# Patient Record
Sex: Male | Born: 1935
Health system: Southern US, Community
[De-identification: ages and names within clinical notes are randomized; demographics above are authoritative.]

## PROBLEM LIST (undated history)

## (undated) DIAGNOSIS — K432 Incisional hernia without obstruction or gangrene: Secondary | ICD-10-CM

## (undated) DIAGNOSIS — K219 Gastro-esophageal reflux disease without esophagitis: Secondary | ICD-10-CM

## (undated) DIAGNOSIS — D490 Neoplasm of unspecified behavior of digestive system: Secondary | ICD-10-CM

## (undated) DIAGNOSIS — J42 Unspecified chronic bronchitis: Secondary | ICD-10-CM

## (undated) DIAGNOSIS — J449 Chronic obstructive pulmonary disease, unspecified: Secondary | ICD-10-CM

## (undated) DIAGNOSIS — R0981 Nasal congestion: Secondary | ICD-10-CM

## (undated) DIAGNOSIS — H409 Unspecified glaucoma: Secondary | ICD-10-CM

## (undated) DIAGNOSIS — H9190 Unspecified hearing loss, unspecified ear: Secondary | ICD-10-CM

## (undated) DIAGNOSIS — N1832 Chronic kidney disease, stage 3b: Secondary | ICD-10-CM

## (undated) DIAGNOSIS — N400 Enlarged prostate without lower urinary tract symptoms: Secondary | ICD-10-CM

## (undated) DIAGNOSIS — K589 Irritable bowel syndrome without diarrhea: Secondary | ICD-10-CM

## (undated) DIAGNOSIS — M5430 Sciatica, unspecified side: Secondary | ICD-10-CM

## (undated) HISTORY — PX: HERNIA REPAIR: SHX51

## (undated) HISTORY — DX: Unspecified chronic bronchitis: J42

## (undated) HISTORY — DX: Chronic kidney disease, stage 3b: N18.32

## (undated) HISTORY — PX: SKIN LESION EXCISION: SHX2412

## (undated) HISTORY — PX: HEMORRHOID SURGERY: SHX153

## (undated) HISTORY — DX: Gastro-esophageal reflux disease without esophagitis: K21.9

## (undated) HISTORY — PX: TONSILLECTOMY: SUR1361

---

## 1997-11-24 ENCOUNTER — Other Ambulatory Visit: Admission: RE | Admit: 1997-11-24 | Discharge: 1997-11-24 | Payer: Self-pay | Admitting: Pulmonary Disease

## 1998-06-13 ENCOUNTER — Encounter: Payer: Self-pay | Admitting: Family Medicine

## 1998-06-13 ENCOUNTER — Ambulatory Visit (HOSPITAL_COMMUNITY): Admission: RE | Admit: 1998-06-13 | Discharge: 1998-06-13 | Payer: Self-pay | Admitting: *Deleted

## 2002-04-10 ENCOUNTER — Encounter (INDEPENDENT_AMBULATORY_CARE_PROVIDER_SITE_OTHER): Payer: Self-pay | Admitting: Specialist

## 2002-04-10 ENCOUNTER — Ambulatory Visit (HOSPITAL_COMMUNITY): Admission: RE | Admit: 2002-04-10 | Discharge: 2002-04-10 | Payer: Self-pay | Admitting: Gastroenterology

## 2002-11-14 ENCOUNTER — Encounter: Payer: Self-pay | Admitting: Emergency Medicine

## 2002-11-14 ENCOUNTER — Emergency Department (HOSPITAL_COMMUNITY): Admission: EM | Admit: 2002-11-14 | Discharge: 2002-11-14 | Payer: Self-pay | Admitting: Emergency Medicine

## 2003-04-29 ENCOUNTER — Ambulatory Visit: Admission: RE | Admit: 2003-04-29 | Discharge: 2003-04-29 | Payer: Self-pay | Admitting: Pulmonary Disease

## 2003-05-04 ENCOUNTER — Ambulatory Visit (HOSPITAL_COMMUNITY): Admission: RE | Admit: 2003-05-04 | Discharge: 2003-05-04 | Payer: Self-pay | Admitting: Pulmonary Disease

## 2003-08-14 ENCOUNTER — Encounter: Admission: RE | Admit: 2003-08-14 | Discharge: 2003-08-14 | Payer: Self-pay | Admitting: Orthopedic Surgery

## 2009-03-11 ENCOUNTER — Encounter: Admission: RE | Admit: 2009-03-11 | Discharge: 2009-03-11 | Payer: Self-pay | Admitting: Family Medicine

## 2009-08-11 ENCOUNTER — Encounter: Payer: Self-pay | Admitting: Internal Medicine

## 2010-03-21 ENCOUNTER — Ambulatory Visit: Payer: Self-pay | Admitting: Internal Medicine

## 2010-03-21 DIAGNOSIS — R05 Cough: Secondary | ICD-10-CM

## 2010-03-21 DIAGNOSIS — J432 Centrilobular emphysema: Secondary | ICD-10-CM | POA: Insufficient documentation

## 2010-03-21 DIAGNOSIS — J438 Other emphysema: Secondary | ICD-10-CM | POA: Insufficient documentation

## 2010-04-11 ENCOUNTER — Ambulatory Visit: Payer: Self-pay | Admitting: Internal Medicine

## 2010-06-27 NOTE — Assessment & Plan Note (Signed)
Summary: Pulmonary/ ext ov for cough eval   Copy to:  Dr. Vernon Prey Primary Provider/Referring Provider:  Dr. Vernon Prey   History of Present Illness: 62 yowm quit smoking 2008 with doe x > adls's like chopping wood or long walk   March 21, 2010  1st pulmonary office eval in [place] on Advair cc sore throat with lots of mucus production assoc with lots of nasal congestion worse early am present x 2 years assoc with occ dysphagia.  Have to wake up to urinate and note it some them also but doesn't tuypically wake him up.    no better with nexium stop advair and fish oil and nasonex Astelin in am and pm  take prilosec otc 20 mg Take one 30-60 min before first and last meals of the day    April 11, 2010 ov cc sore throat and throat congestion and voice texture substantially better off  advair and breathing no worse then abruptly 11/13 woke up aching all over diarrhea,   no flare of resp symtoms.  Pt denies any significant  dysphagia, itching, sneezing,  nasal congestion or excess secretions,  fever, chills, sweats, unintended wt loss, pleuritic or exertional cp, hempoptysis, change in activity tolerance  orthopnea pnd or leg swelling .  Pt also denies any obvious fluctuation in symptoms with weather or environmental change or other alleviating or aggravating factors.          Allergies: 1)  ! Demerol  Past History:  Past Medical History: Emphysema     - Try off advair March 21, 2010 due to uacs > no worse sob  Cough................................................................Marland KitchenWert     - Sinus ct 03/11/09 >> neg  Clinical Reports Reviewed:  CXR:  08/11/2009: CXR Results:  slt hyperinflation o/w ok   Vital Signs:  Patient profile:   77 year old male Height:      70 inches Weight:      178 pounds  Vitals Entered By: Carron Curie CMA (April 11, 2010 10:05 AM)  Physical Exam  Additional Exam:  Pleasant mod hoarse amb wm with no longer pseudowheeze wt 179 >  178 April 11, 2010  HEENT mild turbinate edema.  Oropharynx no thrush or excess pnd or cobblestoning.  No JVD or cervical adenopathy. Mild accessory muscle hypertrophy. Trachea midline, nl thryroid. Chest was hyperinflated by percussion with diminished breath sounds and moderate increased exp time without wheeze. Hoover sign positive at mid inspiration. Regular rate and rhythm without murmur gallop or rub or increase P2 or edema.  Abd: no hsm, nl excursion. Ext warm without cyanosis or clubbing.     Impression & Recommendations:  Problem # 1:  COUGH (ICD-786.2)   Classic Upper airway cough syndrome, so named because it's frequently impossible to sort out how much is  CR/sinusitis with freq throat clearing (which can be related to primary GERD)   vs  causing  secondary extra esophageal GERD from wide swings in gastric pressure that occur with throat clearing, promoting self use of mint and menthol lozenges that reduce the lower esophageal sphincter tone and exacerbate the problem further These are the same pts who not infrequently have failed to tolerate ace inhibitors,  dry powder inhalers or biphosphonates or report having reflux symptoms that don't respond to standard doses of PPI  for now continue ppi two times a day and astepro for rhinitis with low threshold to repeat sinus ct if not improvinge  Orders: Est. Patient Level IV (93267)  Problem #  2:  EMPHYSEMA (ICD-492.8) No worse off advair so leave off unless breathing becomes a lmiiting symptom, which he denies for now  Patient Instructions: 1)  continue off advair 2)  If breathing worsens you will need evaluation in the [place] office with PFT"s and office visit with me there 3)  continue astepro, 4)  if cough or throat congestion worsen you can call [phone] and ask for [facility] to set up a sinus ct and follow up with me in [place] same day

## 2010-06-27 NOTE — Assessment & Plan Note (Signed)
Summary: Pulmonary/ new pt eval uacs-try off advair   Copy to:  Dr. Vernon Prey Primary Saphire Barnhart/Referring Shmuel Girgis:  Dr. Vernon Prey  CC:  SOB.Marland Kitchen  History of Present Illness: 61 yowm quit smoking 2008 with doe x > adls's like chopping wood or long walk   March 21, 2010  1st pulmonary office eval in [place] on Advair cc sore throat with lots of mucus production assoc with lots of nasal congestion worse early am present x 2 years assoc with occ dysphagia.  Have to wake up to urinate and note it some them also but doesn't tuypically wake him up.  Pt denies any significant   itching, sneezing,  nasal congestion or excess secretions,  fever, chills, sweats, unintended wt loss, pleuritic or exertional cp, hempoptysis, change in activity tolerance  orthopnea pnd or leg swelling.  not limited in terms of adls.  Pt also denies any obvious fluctuation in symptoms with weather or environmental change or other alleviating or aggravating factors.  no better with nexium     Current Medications (verified): 1)  Advair Diskus 250-50 Mcg/dose Aepb (Fluticasone-Salmeterol) .Marland Kitchen.. 1 Puff Two Times A Day 2)  Aspirin 81 Mg Tbec (Aspirin) .Marland Kitchen.. 1 Once Daily 3)  Nasonex 50 Mcg/act Susp (Mometasone Furoate) .... 2 Squirts Once A Day 4)  Doxazosin Mesylate 4 Mg Tabs (Doxazosin Mesylate) .... Take 1 Tablet By Mouth Once A Day 5)  Astelin 137 Mcg/spray Soln (Azelastine Hcl) .... 2 Prays Each Nostril Once Daily 6)  Fish Oil 1000 Mg Caps (Omega-3 Fatty Acids) .... Take 1 Tablet By Mouth Once A Day 7)  Mucinex Dm 30-600 Mg Xr12h-Tab (Dextromethorphan-Guaifenesin) .... Once Daily 8)  Caltrate 600 1500 Mg Tabs (Calcium Carbonate) .... Take 1 Tablet By Mouth Once A Day 9)  Vitamin D3 1000 Unit Tabs (Cholecalciferol) .... Take 1 Tablet By Mouth Once A Day  Allergies (verified): 1)  ! Demerol  Past History:  Past Medical History: Emphysema     - Try off advair March 21, 2010 due to  uacs Cough................................................................Marland KitchenWert     - Sinus ct 03/11/09 >> neg  Family History: Father-lived to 3 Mother-lived to 86, had a pacemaker  Social History: Married Pt started smoking in 1950, quit in 2008 avg. 2 packs per week.  Retired  Review of Systems       The patient complains of productive cough, sore throat, nasal congestion/difficulty breathing through nose, and change in color of mucus.  The patient denies shortness of breath with activity, shortness of breath at rest, non-productive cough, coughing up blood, chest pain, irregular heartbeats, acid heartburn, indigestion, loss of appetite, weight change, abdominal pain, difficulty swallowing, tooth/dental problems, headaches, sneezing, itching, ear ache, anxiety, depression, hand/feet swelling, joint stiffness or pain, rash, and fever.    Vital Signs:  Patient profile:   77 year old male Height:      70 inches Weight:      179 pounds BMI:     25.78 O2 Sat:      97 % on Room air Temp:     96.5 degrees F oral Pulse rate:   85 / minute BP sitting:   118 / 60  (right arm) Cuff size:   regular  Vitals Entered By: Carron Curie CMA (March 21, 2010 10:12 AM)  O2 Flow:  Room air CC: SOB. Comments Medications reviewed with patient Carron Curie CMA  March 21, 2010 10:20 AM Daytime phone number verified with patient.    Physical Exam  Additional Exam:  Pleasant mod hoarse amb wm with classic pseudowheeze resolves with purse lip maneuver  wt 179 HEENT mild turbinate edema.  Oropharynx no thrush or excess pnd or cobblestoning.  No JVD or cervical adenopathy. Mild accessory muscle hypertrophy. Trachea midline, nl thryroid. Chest was hyperinflated by percussion with diminished breath sounds and moderate increased exp time without wheeze. Hoover sign positive at mid inspiration. Regular rate and rhythm without murmur gallop or rub or increase P2 or edema.  Abd: no hsm, nl  excursion. Ext warm without cyanosis or clubbing.     CXR  Procedure date:  08/11/2009  Findings:      slt hyperinflation o/w ok  Impression & Recommendations:  Problem # 1:  EMPHYSEMA (ICD-492.8) Actually relatively asymptomatic from a lung perspective - main issue is problem #2   DDX of  difficult airways managment all start with A and  include Adherence, Ace Inhibitors, Acid Reflux, Active Sinus Disease, Alpha 1 Antitripsin deficiency, Anxiety masquerading as Airways dz,  ABPA,  allergy(esp in young), Aspiration (esp in elderly), Adverse effects of DPI,  Active smokers, plus one B  = Beta blocker use..   Adherence seems good.  ? acid reflux: already tried nexium but this time rec Take one 30-60 min before first and last meals of the day and gerd diet.  ? adverse effect of dpi  :  try off advair   Orders: New Patient Level V (40981)  Problem # 2:  COUGH (ICD-786.2)  The most common causes of chronic cough in immunocompetent adults include: upper airway cough syndrome (UACS), previously referred to as postnasal drip syndrome,  caused by variety of rhinosinus conditions; (2) asthma; (3) GERD; (4) chronic bronchitis from cigarette smoking or other inhaled environmental irritants; (5) nonasthmatic eosinophilic bronchitis; and (6) bronchiectasis. These conditions, singly or in combination, have accounted for up to 94% of the causes of chronic cough in prospective studies.   this is most c/w  Classic Upper airway cough syndrome, so named because it's frequently impossible to sort out how much is  CR/sinusitis with freq throat clearing (which can be related to primary GERD)   vs  causing  secondary extra esophageal GERD from wide swings in gastric pressure that occur with throat clearing, promoting self use of mint and menthol lozenges that reduce the lower esophageal sphincter tone and exacerbate the problem further These are the same pts who not infrequently have failed to tolerate ace  inhibitors,  dry powder inhalers or biphosphonates or report having reflux symptoms that don't respond to standard doses of PPI  so try off advair, fish oil and on prilosec two times a day on trial basis with close f/u  Medications Added to Medication List This Visit: 1)  Nasonex 50 Mcg/act Susp (Mometasone furoate) .... 2 squirts once a day 2)  Doxazosin Mesylate 4 Mg Tabs (Doxazosin mesylate) .... Take 1 tablet by mouth once a day 3)  Astelin 137 Mcg/spray Soln (Azelastine hcl) .... 2 prays each nostril once daily 4)  Fish Oil 1000 Mg Caps (Omega-3 fatty acids) .... Take 1 tablet by mouth once a day 5)  Mucinex Dm 30-600 Mg Xr12h-tab (Dextromethorphan-guaifenesin) .... Once daily 6)  Caltrate 600 1500 Mg Tabs (Calcium carbonate) .... Take 1 tablet by mouth once a day 7)  Vitamin D3 1000 Unit Tabs (Cholecalciferol) .... Take 1 tablet by mouth once a day 8)  Prilosec Otc 20 Mg Tbec (Omeprazole magnesium) .... Take one 30-60 min before first and last meals  of the day  Patient Instructions: 1)  stop advair and fish oil and nasonex 2)  Astelin in am and pm  3)  take prilosec otc 20 mg Take one 30-60 min before first and last meals of the day  4)  GERD (REFLUX)  is a common cause of respiratory symptoms. It commonly presents without heartburn and can be treated with medication, but also with lifestyle changes including avoidance of late meals, excessive alcohol, smoking cessation, and avoid fatty foods, chocolate, peppermint, colas, red wine, and acidic juices such as orange juice. NO MINT OR MENTHOL PRODUCTS SO NO COUGH DROPS  5)  USE SUGARLESS CANDY INSTEAD (jolley ranchers)  6)  NO OIL BASED VITAMINS (no fish oil) 7)  I will look up your head scan at [facility] 8)  Return next available to evaluate your response to therapy

## 2010-12-28 ENCOUNTER — Encounter: Payer: Self-pay | Admitting: *Deleted

## 2010-12-28 ENCOUNTER — Emergency Department (HOSPITAL_COMMUNITY)
Admission: EM | Admit: 2010-12-28 | Discharge: 2010-12-28 | Disposition: A | Discharge status: Home / Self Care | Payer: Medicare Other | Attending: Emergency Medicine | Admitting: Emergency Medicine

## 2010-12-28 DIAGNOSIS — J449 Chronic obstructive pulmonary disease, unspecified: Secondary | ICD-10-CM | POA: Insufficient documentation

## 2010-12-28 DIAGNOSIS — M5430 Sciatica, unspecified side: Secondary | ICD-10-CM | POA: Insufficient documentation

## 2010-12-28 DIAGNOSIS — Z87891 Personal history of nicotine dependence: Secondary | ICD-10-CM | POA: Insufficient documentation

## 2010-12-28 DIAGNOSIS — Z888 Allergy status to other drugs, medicaments and biological substances status: Secondary | ICD-10-CM | POA: Insufficient documentation

## 2010-12-28 DIAGNOSIS — Z7982 Long term (current) use of aspirin: Secondary | ICD-10-CM | POA: Insufficient documentation

## 2010-12-28 DIAGNOSIS — J4489 Other specified chronic obstructive pulmonary disease: Secondary | ICD-10-CM | POA: Insufficient documentation

## 2010-12-28 DIAGNOSIS — N400 Enlarged prostate without lower urinary tract symptoms: Secondary | ICD-10-CM | POA: Insufficient documentation

## 2010-12-28 DIAGNOSIS — K589 Irritable bowel syndrome without diarrhea: Secondary | ICD-10-CM | POA: Insufficient documentation

## 2010-12-28 HISTORY — DX: Nasal congestion: R09.81

## 2010-12-28 HISTORY — DX: Benign prostatic hyperplasia without lower urinary tract symptoms: N40.0

## 2010-12-28 HISTORY — DX: Irritable bowel syndrome, unspecified: K58.9

## 2010-12-28 HISTORY — DX: Chronic obstructive pulmonary disease, unspecified: J44.9

## 2010-12-28 HISTORY — DX: Sciatica, unspecified side: M54.30

## 2010-12-28 HISTORY — DX: Neoplasm of unspecified behavior of digestive system: D49.0

## 2010-12-28 MED ORDER — PREDNISONE 10 MG PO TABS: 20.0000 mg | ORAL_TABLET | Freq: Every day | ORAL | Status: AC

## 2010-12-28 MED ORDER — CYCLOBENZAPRINE HCL 10 MG PO TABS: 10.0000 mg | ORAL_TABLET | Freq: Two times a day (BID) | ORAL | Status: AC | PRN

## 2010-12-28 NOTE — ED Notes (Signed)
Pain lt buttock down back of leg for 6 days.  Seen by PA and dx with sciatica.  No relief with  Vicodin.  No known injury

## 2010-12-28 NOTE — ED Notes (Signed)
Friday with pain, seen PCP on Tuesday dx sciatica nerve and received prescription for Vicodin, seen another MD yesterday and took a xray, c/o lt. Hip/lower back, pt. States pain is getting worse

## 2010-12-28 NOTE — ED Provider Notes (Signed)
History   Chart scribed for Hilario Quarry, MD by Enos Fling; the patient was seen in room APA19/APA19; this patient's care was started at 8:03 AM.    CSN: [id number] Arrival date & time: 12/28/2010  4:54 AM  Chief Complaint  Patient presents with  . Sciatica   HPI Charles Golden is a 77 y.o. male who presents to the Emergency Department complaining of LLE  pain. Pt states aching pain to LLE onset 6 days ago in his hip, radiating down to ankle. Pain is intermittent and varying in severity, worse at night and with lying down, better with walking around. Pt seen 2x for this since onset, given hydrocodone at first visit 2 days ago and dx with sciatica. Hydrocodone did not provide any relief. Also tried ibuprofen, took 400mg  3x throughout the day yesterday with no relief. Pt then seen by chiropractor yesterday, x-Eirik Schueler done at that time, has appt in 2 hours to get results. Denies loss of bowel or bladder control. No back pain. No injury.  Past Medical History  Diagnosis Date  . IBS (irritable bowel syndrome)   . Enlarged prostate   . Sinus congestion   . Sciatic pain   . COPD (chronic obstructive pulmonary disease)   . Glucagonoma   IBS December 2011  Past Surgical History  Procedure Date  . Hernia repair   . Hemorrhoid surgery     History reviewed. No pertinent family history.  History  Substance Use Topics  . Smoking status: Former Games developer  . Smokeless tobacco: Not on file  . Alcohol Use: No   MEDICATIONS:  Previous Medications   ALOSETRON (LOTRONEX) 0.5 MG TABLET    Take 0.5 mg by mouth 2 (two) times daily.     ASPIRIN 81 MG TABLET    Take 81 mg by mouth daily.     DOXAZOSIN (CARDURA) 4 MG TABLET    Take 4 mg by mouth at bedtime.       ALLERGIES:  Allergies as of 12/28/2010 - Review Complete 12/28/2010  Allergen Reaction Noted  . Meperidine hcl  03/21/2010       Review of Systems 10 Systems reviewed and are negative for acute change except as noted in the  HPI.  Physical Exam  BP 147/76  Pulse 67  Temp(Src) 97.9 F (36.6 C) (Oral)  Resp 18  Ht 5\' 10"  (1.778 m)  Wt 170 lb (77.111 kg)  BMI 24.39 kg/m2  SpO2 97%  Physical Exam  Nursing note and vitals reviewed. Constitutional: He is oriented to person, place, and time. He appears well-developed and well-nourished. No distress.  HENT:  Head: Normocephalic and atraumatic.  Right Ear: External ear normal.  Left Ear: External ear normal.  Nose: Nose normal.  Mouth/Throat: Oropharynx is clear and moist.  Eyes: Conjunctivae and EOM are normal. Pupils are equal, round, and reactive to light.  Neck: Normal range of motion. Neck supple.  Cardiovascular: Normal rate, regular rhythm, normal heart sounds and intact distal pulses.   Pulmonary/Chest: Effort normal and breath sounds normal.  Abdominal: Soft. Bowel sounds are normal. He exhibits no mass. There is no tenderness.  Musculoskeletal: Normal range of motion. He exhibits no edema and no tenderness.       Left hip mild tenderness  Neurological: He is alert and oriented to person, place, and time. He has normal reflexes.       Patellar reflexes normal; BLE strength normal  Skin: Skin is warm and dry.  Psychiatric: He has a  normal mood and affect. His behavior is normal. Thought content normal.    ED Course  Procedures  MDM Sciatica pain, c/w pt's previous dx 2 days ago by PCP; flexeril and prednisone; continue with chiropractor f/u as scheduled    I personally performed the services described in this documentation, which was scribed in my presence. The recorded information has been reviewed and considered. Hilario Quarry, MD    Hilario Quarry, MD 12/28/10 [phone]

## 2011-01-09 ENCOUNTER — Other Ambulatory Visit: Payer: Self-pay | Admitting: Family Medicine

## 2011-01-09 DIAGNOSIS — M545 Low back pain: Secondary | ICD-10-CM

## 2011-01-11 ENCOUNTER — Ambulatory Visit
Admission: RE | Admit: 2011-01-11 | Discharge: 2011-01-11 | Disposition: A | Discharge status: Home / Self Care | Payer: Medicare Other | Source: Ambulatory Visit | Attending: Family Medicine | Admitting: Family Medicine

## 2011-01-11 DIAGNOSIS — M545 Low back pain: Secondary | ICD-10-CM

## 2011-02-12 ENCOUNTER — Ambulatory Visit: Payer: Medicare Other | Attending: Physical Medicine and Rehabilitation | Admitting: Physical Therapy

## 2011-02-12 DIAGNOSIS — M545 Low back pain, unspecified: Secondary | ICD-10-CM | POA: Insufficient documentation

## 2011-02-12 DIAGNOSIS — R5381 Other malaise: Secondary | ICD-10-CM | POA: Insufficient documentation

## 2011-02-12 DIAGNOSIS — IMO0001 Reserved for inherently not codable concepts without codable children: Secondary | ICD-10-CM | POA: Insufficient documentation

## 2011-02-15 ENCOUNTER — Ambulatory Visit: Payer: Medicare Other | Admitting: Physical Therapy

## 2011-02-20 ENCOUNTER — Ambulatory Visit: Payer: Medicare Other | Admitting: *Deleted

## 2011-02-22 ENCOUNTER — Ambulatory Visit: Payer: Medicare Other | Admitting: *Deleted

## 2011-02-27 ENCOUNTER — Ambulatory Visit: Payer: Medicare Other | Attending: Physical Medicine and Rehabilitation | Admitting: *Deleted

## 2011-02-27 DIAGNOSIS — M545 Low back pain, unspecified: Secondary | ICD-10-CM | POA: Insufficient documentation

## 2011-02-27 DIAGNOSIS — R5381 Other malaise: Secondary | ICD-10-CM | POA: Insufficient documentation

## 2011-02-27 DIAGNOSIS — IMO0001 Reserved for inherently not codable concepts without codable children: Secondary | ICD-10-CM | POA: Insufficient documentation

## 2011-03-01 ENCOUNTER — Encounter: Payer: Medicare Other | Admitting: *Deleted

## 2011-06-04 DIAGNOSIS — R197 Diarrhea, unspecified: Secondary | ICD-10-CM | POA: Diagnosis not present

## 2011-06-05 DIAGNOSIS — R197 Diarrhea, unspecified: Secondary | ICD-10-CM | POA: Diagnosis not present

## 2011-06-12 DIAGNOSIS — R197 Diarrhea, unspecified: Secondary | ICD-10-CM | POA: Diagnosis not present

## 2011-06-12 DIAGNOSIS — E538 Deficiency of other specified B group vitamins: Secondary | ICD-10-CM | POA: Diagnosis not present

## 2011-06-12 DIAGNOSIS — R6889 Other general symptoms and signs: Secondary | ICD-10-CM | POA: Diagnosis not present

## 2011-06-14 DIAGNOSIS — R6889 Other general symptoms and signs: Secondary | ICD-10-CM | POA: Diagnosis not present

## 2011-06-14 DIAGNOSIS — R197 Diarrhea, unspecified: Secondary | ICD-10-CM | POA: Diagnosis not present

## 2011-06-26 DIAGNOSIS — R197 Diarrhea, unspecified: Secondary | ICD-10-CM | POA: Diagnosis not present

## 2011-07-23 DIAGNOSIS — K5289 Other specified noninfective gastroenteritis and colitis: Secondary | ICD-10-CM | POA: Diagnosis not present

## 2011-07-23 DIAGNOSIS — K2970 Gastritis, unspecified, without bleeding: Secondary | ICD-10-CM | POA: Diagnosis not present

## 2011-07-23 DIAGNOSIS — R1013 Epigastric pain: Secondary | ICD-10-CM | POA: Diagnosis not present

## 2011-07-23 DIAGNOSIS — K2980 Duodenitis without bleeding: Secondary | ICD-10-CM | POA: Diagnosis not present

## 2011-07-23 DIAGNOSIS — Z86010 Personal history of colonic polyps: Secondary | ICD-10-CM | POA: Diagnosis not present

## 2011-07-23 DIAGNOSIS — Z1211 Encounter for screening for malignant neoplasm of colon: Secondary | ICD-10-CM | POA: Diagnosis not present

## 2011-07-23 DIAGNOSIS — K2990 Gastroduodenitis, unspecified, without bleeding: Secondary | ICD-10-CM | POA: Diagnosis not present

## 2011-07-23 DIAGNOSIS — K648 Other hemorrhoids: Secondary | ICD-10-CM | POA: Diagnosis not present

## 2011-07-23 DIAGNOSIS — R197 Diarrhea, unspecified: Secondary | ICD-10-CM | POA: Diagnosis not present

## 2011-07-31 DIAGNOSIS — M48061 Spinal stenosis, lumbar region without neurogenic claudication: Secondary | ICD-10-CM | POA: Diagnosis not present

## 2011-09-04 DIAGNOSIS — R197 Diarrhea, unspecified: Secondary | ICD-10-CM | POA: Diagnosis not present

## 2011-09-18 DIAGNOSIS — R197 Diarrhea, unspecified: Secondary | ICD-10-CM | POA: Diagnosis not present

## 2011-09-19 DIAGNOSIS — R197 Diarrhea, unspecified: Secondary | ICD-10-CM | POA: Diagnosis not present

## 2011-09-21 DIAGNOSIS — J209 Acute bronchitis, unspecified: Secondary | ICD-10-CM | POA: Diagnosis not present

## 2011-09-27 DIAGNOSIS — R972 Elevated prostate specific antigen [PSA]: Secondary | ICD-10-CM | POA: Diagnosis not present

## 2011-09-27 DIAGNOSIS — N401 Enlarged prostate with lower urinary tract symptoms: Secondary | ICD-10-CM | POA: Diagnosis not present

## 2011-10-03 DIAGNOSIS — J209 Acute bronchitis, unspecified: Secondary | ICD-10-CM | POA: Diagnosis not present

## 2011-10-03 DIAGNOSIS — J019 Acute sinusitis, unspecified: Secondary | ICD-10-CM | POA: Diagnosis not present

## 2011-10-15 DIAGNOSIS — R197 Diarrhea, unspecified: Secondary | ICD-10-CM | POA: Diagnosis not present

## 2011-11-13 DIAGNOSIS — R5381 Other malaise: Secondary | ICD-10-CM | POA: Diagnosis not present

## 2011-11-13 DIAGNOSIS — E785 Hyperlipidemia, unspecified: Secondary | ICD-10-CM | POA: Diagnosis not present

## 2011-11-13 DIAGNOSIS — R5383 Other fatigue: Secondary | ICD-10-CM | POA: Diagnosis not present

## 2011-11-13 DIAGNOSIS — H4011X0 Primary open-angle glaucoma, stage unspecified: Secondary | ICD-10-CM | POA: Diagnosis not present

## 2011-11-13 DIAGNOSIS — R197 Diarrhea, unspecified: Secondary | ICD-10-CM | POA: Diagnosis not present

## 2011-11-13 DIAGNOSIS — J209 Acute bronchitis, unspecified: Secondary | ICD-10-CM | POA: Diagnosis not present

## 2011-11-13 DIAGNOSIS — Z1212 Encounter for screening for malignant neoplasm of rectum: Secondary | ICD-10-CM | POA: Diagnosis not present

## 2011-11-13 DIAGNOSIS — J410 Simple chronic bronchitis: Secondary | ICD-10-CM | POA: Diagnosis not present

## 2011-11-16 DIAGNOSIS — E559 Vitamin D deficiency, unspecified: Secondary | ICD-10-CM | POA: Diagnosis not present

## 2011-11-16 DIAGNOSIS — E785 Hyperlipidemia, unspecified: Secondary | ICD-10-CM | POA: Diagnosis not present

## 2011-12-06 DIAGNOSIS — R490 Dysphonia: Secondary | ICD-10-CM | POA: Diagnosis not present

## 2011-12-06 DIAGNOSIS — J310 Chronic rhinitis: Secondary | ICD-10-CM | POA: Diagnosis not present

## 2011-12-31 DIAGNOSIS — N401 Enlarged prostate with lower urinary tract symptoms: Secondary | ICD-10-CM | POA: Diagnosis not present

## 2012-01-17 DIAGNOSIS — R197 Diarrhea, unspecified: Secondary | ICD-10-CM | POA: Diagnosis not present

## 2012-01-17 DIAGNOSIS — J069 Acute upper respiratory infection, unspecified: Secondary | ICD-10-CM | POA: Diagnosis not present

## 2012-02-12 DIAGNOSIS — J40 Bronchitis, not specified as acute or chronic: Secondary | ICD-10-CM | POA: Diagnosis not present

## 2012-02-12 DIAGNOSIS — M545 Low back pain: Secondary | ICD-10-CM | POA: Diagnosis not present

## 2012-02-12 DIAGNOSIS — N390 Urinary tract infection, site not specified: Secondary | ICD-10-CM | POA: Diagnosis not present

## 2012-02-15 DIAGNOSIS — E559 Vitamin D deficiency, unspecified: Secondary | ICD-10-CM | POA: Diagnosis not present

## 2012-02-15 DIAGNOSIS — R5381 Other malaise: Secondary | ICD-10-CM | POA: Diagnosis not present

## 2012-02-15 DIAGNOSIS — E785 Hyperlipidemia, unspecified: Secondary | ICD-10-CM | POA: Diagnosis not present

## 2012-03-19 DIAGNOSIS — J020 Streptococcal pharyngitis: Secondary | ICD-10-CM | POA: Diagnosis not present

## 2012-03-19 DIAGNOSIS — J029 Acute pharyngitis, unspecified: Secondary | ICD-10-CM | POA: Diagnosis not present

## 2012-03-19 DIAGNOSIS — J441 Chronic obstructive pulmonary disease with (acute) exacerbation: Secondary | ICD-10-CM | POA: Diagnosis not present

## 2012-03-20 DIAGNOSIS — Z23 Encounter for immunization: Secondary | ICD-10-CM | POA: Diagnosis not present

## 2012-04-15 DIAGNOSIS — J019 Acute sinusitis, unspecified: Secondary | ICD-10-CM | POA: Diagnosis not present

## 2012-04-15 DIAGNOSIS — J441 Chronic obstructive pulmonary disease with (acute) exacerbation: Secondary | ICD-10-CM | POA: Diagnosis not present

## 2012-04-15 DIAGNOSIS — L509 Urticaria, unspecified: Secondary | ICD-10-CM | POA: Diagnosis not present

## 2012-06-03 DIAGNOSIS — H40059 Ocular hypertension, unspecified eye: Secondary | ICD-10-CM | POA: Diagnosis not present

## 2012-06-03 DIAGNOSIS — H40029 Open angle with borderline findings, high risk, unspecified eye: Secondary | ICD-10-CM | POA: Diagnosis not present

## 2012-06-03 DIAGNOSIS — H26499 Other secondary cataract, unspecified eye: Secondary | ICD-10-CM | POA: Diagnosis not present

## 2012-06-03 DIAGNOSIS — Z961 Presence of intraocular lens: Secondary | ICD-10-CM | POA: Diagnosis not present

## 2012-06-05 DIAGNOSIS — J441 Chronic obstructive pulmonary disease with (acute) exacerbation: Secondary | ICD-10-CM | POA: Diagnosis not present

## 2012-06-05 DIAGNOSIS — M810 Age-related osteoporosis without current pathological fracture: Secondary | ICD-10-CM | POA: Diagnosis not present

## 2012-06-12 DIAGNOSIS — R197 Diarrhea, unspecified: Secondary | ICD-10-CM | POA: Diagnosis not present

## 2012-06-12 DIAGNOSIS — N429 Disorder of prostate, unspecified: Secondary | ICD-10-CM | POA: Diagnosis not present

## 2012-06-12 DIAGNOSIS — E785 Hyperlipidemia, unspecified: Secondary | ICD-10-CM | POA: Diagnosis not present

## 2012-06-12 DIAGNOSIS — E559 Vitamin D deficiency, unspecified: Secondary | ICD-10-CM | POA: Diagnosis not present

## 2012-07-02 DIAGNOSIS — Z87442 Personal history of urinary calculi: Secondary | ICD-10-CM | POA: Diagnosis not present

## 2012-07-02 DIAGNOSIS — N401 Enlarged prostate with lower urinary tract symptoms: Secondary | ICD-10-CM | POA: Diagnosis not present

## 2012-08-15 ENCOUNTER — Telehealth: Payer: Self-pay | Admitting: Family Medicine

## 2012-08-15 NOTE — Telephone Encounter (Signed)
tudorza  rx fefilled please call

## 2012-08-15 NOTE — Telephone Encounter (Signed)
Pt's RX originally RX'd at [facility], pt wanted switched to [facility]. Call [facility] and transferred RX

## 2012-09-03 ENCOUNTER — Encounter: Payer: Self-pay | Admitting: Family Medicine

## 2012-09-03 ENCOUNTER — Ambulatory Visit (INDEPENDENT_AMBULATORY_CARE_PROVIDER_SITE_OTHER): Payer: Medicare Other | Admitting: Family Medicine

## 2012-09-03 VITALS — BP 119/66 | HR 83 | Temp 96.7°F | Ht 70.0 in | Wt 174.0 lb

## 2012-09-03 DIAGNOSIS — R05 Cough: Secondary | ICD-10-CM

## 2012-09-03 DIAGNOSIS — J438 Other emphysema: Secondary | ICD-10-CM | POA: Diagnosis not present

## 2012-09-03 DIAGNOSIS — K589 Irritable bowel syndrome without diarrhea: Secondary | ICD-10-CM

## 2012-09-03 DIAGNOSIS — L089 Local infection of the skin and subcutaneous tissue, unspecified: Secondary | ICD-10-CM

## 2012-09-03 DIAGNOSIS — J329 Chronic sinusitis, unspecified: Secondary | ICD-10-CM | POA: Diagnosis not present

## 2012-09-03 DIAGNOSIS — R059 Cough, unspecified: Secondary | ICD-10-CM

## 2012-09-03 MED ORDER — AZELASTINE HCL 0.1 % NA SOLN: 1.0000 | NASAL | Status: DC

## 2012-09-03 MED ORDER — CEPHALEXIN 500 MG PO CAPS: 500.0000 mg | ORAL_CAPSULE | Freq: Two times a day (BID) | ORAL | Status: DC

## 2012-09-03 NOTE — Patient Instructions (Addendum)
Fall precautions discussed Continue current meds and therapeutic lifestyle changes ccontinue Mucinex once or twice a day

## 2012-09-03 NOTE — Progress Notes (Signed)
  Subjective:    Patient ID: Charles Golden, male    DOB: 10/17/1925, 79 y.o.   MRN: [id number]  HPI This patient presents for recheck of multiple medical problems. No one accompanies the patient today.  Patient Active Problem List  Diagnosis  . EMPHYSEMA  . COUGH  . IBS (irritable bowel syndrome)  . Chronic rhinosinusitis    In addition, he as had persistant problems with infection in his left index finger nailbed.  The allergies, current medications, past medical history, surgical history, family and social history are reviewed.  Immunizations reviewed.  Health maintenance reviewed.  The following items are outstanding:None     Review of Systems  Constitutional: Positive for fatigue (some days worse than others). Negative for fever.       Night sweats  HENT: Positive for congestion and postnasal drip.   Respiratory: Positive for cough (prod).   Gastrointestinal: Positive for abdominal pain and diarrhea (daily).  Genitourinary: Negative.   Musculoskeletal: Negative.   Neurological: Positive for headaches (daily). Negative for dizziness and light-headedness.  Patient has had off and on loose bowel movements for the past 2 months. He has used a Prevalite solution in the past which helped  from Dr. Kinnie Scales .    Objective:   Physical Exam  BP 119/66  Pulse 83  Temp(Src) 96.7 F (35.9 C) (Oral)  Ht 5\' 10"  (1.778 m)  Wt 174 lb (78.926 kg)  BMI 24.97 kg/m2  The patient appeared well nourished and normally developed, alert and oriented to time and place. Speech, behavior and judgement appear normal. He does have trouble with hearing. Vital signs as documented.  Head exam is unremarkable. No scleral icterus or pallor noted. His nasal passages were clear and his throat was without drainage. Neck is without jugular venous distension, thyromegally, or carotid bruits. Carotid upstrokes are brisk bilaterally. No cervical adenopathy. Lungs are clear anteriorly and posteriorly to  auscultation. Normal respiratory effort. Cardiac exam reveals regular rate and rhythm. First and second heart sounds normal. No murmurs, rubs or gallops.  Abdominal exam reveals normal bowl sounds, no masses, no organomegaly and no aortic enlargement. There was some slight tenderness in the right side. No inguinal adenopathy. Extremities are nonedematous and both femoral and pedal pulses are normal. As a note the left index finger nailbed was swollen and tender to touch. The drainage could be expressed from the nail bed. Skin without pallor or jaundice.  Warm and dry, without rash. Neurologic exam reveals normal deep tendon reflexes and normal sensation.         Assessment & Plan:  1. IBS (irritable bowel syndrome) Patient will recheck with Dr. Kinnie Scales about medication  that he prescribed for this in the past  2. Chronic rhinosinusitis - azelastine (ASTELIN) 137 MCG/SPRAY nasal spray; Place 1 spray into the nose 1 day or 1 dose. Use in each nostril as directed  Dispense: 30 mL; Refill: 12  3. EMPHYSEMA Continued Tudorza and Mucinex  4. Cough Continue mucinex  5. Finger infection - cephALEXin (KEFLEX) 500 MG capsule; Take 1 capsule (500 mg total) by mouth 2 (two) times daily.  Dispense: 20 capsule; Refill: 0

## 2012-09-04 ENCOUNTER — Inpatient Hospital Stay (HOSPITAL_COMMUNITY)
Admission: EM | Admit: 2012-09-04 | Discharge: 2012-09-12 | DRG: 331 | Disposition: A | Discharge status: Home / Self Care | Payer: Medicare Other | Attending: General Surgery | Admitting: General Surgery

## 2012-09-04 ENCOUNTER — Observation Stay (HOSPITAL_COMMUNITY): Payer: Medicare Other | Admitting: Anesthesiology

## 2012-09-04 ENCOUNTER — Emergency Department (HOSPITAL_COMMUNITY): Payer: Medicare Other

## 2012-09-04 ENCOUNTER — Encounter (HOSPITAL_COMMUNITY)
Admission: EM | Disposition: A | Discharge status: Home / Self Care | Payer: Self-pay | Source: Home / Self Care | Attending: General Surgery

## 2012-09-04 ENCOUNTER — Encounter (HOSPITAL_COMMUNITY): Payer: Self-pay | Admitting: Anesthesiology

## 2012-09-04 ENCOUNTER — Encounter (HOSPITAL_COMMUNITY): Payer: Self-pay | Admitting: Emergency Medicine

## 2012-09-04 DIAGNOSIS — R609 Edema, unspecified: Secondary | ICD-10-CM | POA: Diagnosis not present

## 2012-09-04 DIAGNOSIS — R109 Unspecified abdominal pain: Secondary | ICD-10-CM | POA: Diagnosis not present

## 2012-09-04 DIAGNOSIS — N200 Calculus of kidney: Secondary | ICD-10-CM | POA: Diagnosis not present

## 2012-09-04 DIAGNOSIS — Z8739 Personal history of other diseases of the musculoskeletal system and connective tissue: Secondary | ICD-10-CM | POA: Diagnosis not present

## 2012-09-04 DIAGNOSIS — M5430 Sciatica, unspecified side: Secondary | ICD-10-CM | POA: Diagnosis present

## 2012-09-04 DIAGNOSIS — N400 Enlarged prostate without lower urinary tract symptoms: Secondary | ICD-10-CM | POA: Diagnosis present

## 2012-09-04 DIAGNOSIS — Z87448 Personal history of other diseases of urinary system: Secondary | ICD-10-CM | POA: Diagnosis not present

## 2012-09-04 DIAGNOSIS — R52 Pain, unspecified: Secondary | ICD-10-CM | POA: Diagnosis not present

## 2012-09-04 DIAGNOSIS — Z87891 Personal history of nicotine dependence: Secondary | ICD-10-CM

## 2012-09-04 DIAGNOSIS — IMO0002 Reserved for concepts with insufficient information to code with codable children: Secondary | ICD-10-CM | POA: Diagnosis not present

## 2012-09-04 DIAGNOSIS — Z8719 Personal history of other diseases of the digestive system: Secondary | ICD-10-CM | POA: Diagnosis not present

## 2012-09-04 DIAGNOSIS — Z7982 Long term (current) use of aspirin: Secondary | ICD-10-CM | POA: Diagnosis not present

## 2012-09-04 DIAGNOSIS — Z8709 Personal history of other diseases of the respiratory system: Secondary | ICD-10-CM | POA: Diagnosis not present

## 2012-09-04 DIAGNOSIS — J449 Chronic obstructive pulmonary disease, unspecified: Secondary | ICD-10-CM | POA: Diagnosis not present

## 2012-09-04 DIAGNOSIS — Z9049 Acquired absence of other specified parts of digestive tract: Secondary | ICD-10-CM | POA: Diagnosis not present

## 2012-09-04 DIAGNOSIS — K562 Volvulus: Secondary | ICD-10-CM | POA: Diagnosis not present

## 2012-09-04 DIAGNOSIS — R1084 Generalized abdominal pain: Secondary | ICD-10-CM | POA: Diagnosis not present

## 2012-09-04 DIAGNOSIS — K589 Irritable bowel syndrome without diarrhea: Secondary | ICD-10-CM | POA: Diagnosis present

## 2012-09-04 DIAGNOSIS — J4489 Other specified chronic obstructive pulmonary disease: Secondary | ICD-10-CM | POA: Diagnosis present

## 2012-09-04 DIAGNOSIS — D137 Benign neoplasm of endocrine pancreas: Secondary | ICD-10-CM | POA: Diagnosis present

## 2012-09-04 DIAGNOSIS — Z79899 Other long term (current) drug therapy: Secondary | ICD-10-CM

## 2012-09-04 DIAGNOSIS — K56609 Unspecified intestinal obstruction, unspecified as to partial versus complete obstruction: Secondary | ICD-10-CM | POA: Diagnosis not present

## 2012-09-04 DIAGNOSIS — K55059 Acute (reversible) ischemia of intestine, part and extent unspecified: Secondary | ICD-10-CM | POA: Diagnosis not present

## 2012-09-04 HISTORY — PX: LAPAROTOMY: SHX154

## 2012-09-04 HISTORY — PX: BOWEL RESECTION: SHX1257

## 2012-09-04 LAB — CBC WITH DIFFERENTIAL/PLATELET
Basophils Absolute: 0 10*3/uL (ref 0.0–0.1)
Basophils Relative: 0 % (ref 0–1)
MCH: 30.3 pg (ref 26.0–34.0)
MCV: 92.3 fL (ref 78.0–100.0)
Monocytes Absolute: 0.6 10*3/uL (ref 0.1–1.0)
Monocytes Relative: 7 % (ref 3–12)
Neutro Abs: 5.7 10*3/uL (ref 1.7–7.7)
Platelets: 231 10*3/uL (ref 150–400)
RDW: 13.7 % (ref 11.5–15.5)
WBC: 8.4 10*3/uL (ref 4.0–10.5)

## 2012-09-04 LAB — COMPREHENSIVE METABOLIC PANEL
AST: 15 U/L (ref 0–37)
Albumin: 3.2 g/dL — ABNORMAL LOW (ref 3.5–5.2)
BUN: 17 mg/dL (ref 6–23)
CO2: 26 mEq/L (ref 19–32)
Potassium: 3.9 mEq/L (ref 3.5–5.1)
Sodium: 142 mEq/L (ref 135–145)
Total Protein: 6.5 g/dL (ref 6.0–8.3)

## 2012-09-04 LAB — URINALYSIS, ROUTINE W REFLEX MICROSCOPIC
Hgb urine dipstick: NEGATIVE
Ketones, ur: NEGATIVE mg/dL
Protein, ur: NEGATIVE mg/dL
Specific Gravity, Urine: >1.03 — ABNORMAL HIGH (ref 1.005–1.030)
Urobilinogen, UA: 0.2 mg/dL (ref 0.0–1.0)
pH: 5.5 (ref 5.0–8.0)

## 2012-09-04 LAB — LIPASE, BLOOD: Lipase: 51 U/L (ref 11–59)

## 2012-09-04 LAB — PROTIME-INR: Prothrombin Time: 13.5 seconds (ref 11.6–15.2)

## 2012-09-04 LAB — SAMPLE TO BLOOD BANK

## 2012-09-04 LAB — SURGICAL PCR SCREEN: Staphylococcus aureus: NEGATIVE

## 2012-09-04 SURGERY — LAPAROTOMY, EXPLORATORY
Anesthesia: General | Site: Abdomen | Wound class: Contaminated

## 2012-09-04 MED ORDER — SODIUM CHLORIDE 0.9 % IV SOLN
1.0000 g | INTRAVENOUS | Status: AC
  Administered 2012-09-04: 1 g via INTRAVENOUS

## 2012-09-04 MED ORDER — MIDAZOLAM HCL 2 MG/2ML IJ SOLN
INTRAMUSCULAR | Status: AC
  Filled 2012-09-04: qty 2

## 2012-09-04 MED ORDER — ONDANSETRON HCL 4 MG/2ML IJ SOLN
4.0000 mg | Freq: Once | INTRAMUSCULAR | Status: AC
  Administered 2012-09-04: 4 mg via INTRAVENOUS

## 2012-09-04 MED ORDER — ONDANSETRON HCL 4 MG/2ML IJ SOLN
4.0000 mg | Freq: Four times a day (QID) | INTRAMUSCULAR | Status: DC | PRN
  Administered 2012-09-04 (×2): 4 mg via INTRAVENOUS
  Filled 2012-09-04 (×2): qty 2

## 2012-09-04 MED ORDER — ONDANSETRON HCL 4 MG/2ML IJ SOLN
INTRAMUSCULAR | Status: AC
  Filled 2012-09-04: qty 2

## 2012-09-04 MED ORDER — ETOMIDATE 2 MG/ML IV SOLN
INTRAVENOUS | Status: AC
  Filled 2012-09-04: qty 10

## 2012-09-04 MED ORDER — MORPHINE SULFATE 2 MG/ML IJ SOLN
1.0000 mg | INTRAMUSCULAR | Status: DC | PRN
  Administered 2012-09-04 (×4): 2 mg via INTRAVENOUS
  Administered 2012-09-05 (×2): 4 mg via INTRAVENOUS
  Filled 2012-09-04: qty 2
  Filled 2012-09-04: qty 1
  Filled 2012-09-04: qty 2
  Filled 2012-09-04 (×3): qty 1

## 2012-09-04 MED ORDER — SUCCINYLCHOLINE CHLORIDE 20 MG/ML IJ SOLN
INTRAMUSCULAR | Status: DC | PRN
  Administered 2012-09-04: 100 mg via INTRAVENOUS

## 2012-09-04 MED ORDER — NEOSTIGMINE METHYLSULFATE 1 MG/ML IJ SOLN
INTRAMUSCULAR | Status: AC
  Filled 2012-09-04: qty 1

## 2012-09-04 MED ORDER — MORPHINE SULFATE 4 MG/ML IJ SOLN
INTRAMUSCULAR | Status: AC
  Filled 2012-09-04: qty 1

## 2012-09-04 MED ORDER — LORAZEPAM 2 MG/ML IJ SOLN
1.0000 mg | Freq: Once | INTRAMUSCULAR | Status: DC
  Filled 2012-09-04: qty 1

## 2012-09-04 MED ORDER — LACTATED RINGERS IV SOLN
INTRAVENOUS | Status: DC
  Administered 2012-09-04 (×2): via INTRAVENOUS
  Administered 2012-09-04: 500 mL via INTRAVENOUS

## 2012-09-04 MED ORDER — NEOSTIGMINE METHYLSULFATE 1 MG/ML IJ SOLN
INTRAMUSCULAR | Status: DC | PRN
  Administered 2012-09-04: 2 mg via INTRAVENOUS

## 2012-09-04 MED ORDER — LIDOCAINE HCL (CARDIAC) 20 MG/ML IV SOLN
INTRAVENOUS | Status: DC | PRN
  Administered 2012-09-04: 30 mg via INTRAVENOUS

## 2012-09-04 MED ORDER — ROCURONIUM BROMIDE 50 MG/5ML IV SOLN
INTRAVENOUS | Status: AC
  Filled 2012-09-04: qty 1

## 2012-09-04 MED ORDER — GLYCOPYRROLATE 0.2 MG/ML IJ SOLN
INTRAMUSCULAR | Status: AC
  Filled 2012-09-04: qty 3

## 2012-09-04 MED ORDER — SUCCINYLCHOLINE CHLORIDE 20 MG/ML IJ SOLN
INTRAMUSCULAR | Status: AC
  Filled 2012-09-04: qty 1

## 2012-09-04 MED ORDER — FENTANYL CITRATE 0.05 MG/ML IJ SOLN
INTRAMUSCULAR | Status: DC | PRN
  Administered 2012-09-04 (×7): 50 ug via INTRAVENOUS

## 2012-09-04 MED ORDER — FENTANYL CITRATE 0.05 MG/ML IJ SOLN
INTRAMUSCULAR | Status: AC
  Filled 2012-09-04: qty 2

## 2012-09-04 MED ORDER — ETOMIDATE 2 MG/ML IV SOLN
INTRAVENOUS | Status: DC | PRN
  Administered 2012-09-04: 2 mg via INTRAVENOUS
  Administered 2012-09-04: 12 mg via INTRAVENOUS

## 2012-09-04 MED ORDER — GLYCOPYRROLATE 0.2 MG/ML IJ SOLN
INTRAMUSCULAR | Status: DC | PRN
  Administered 2012-09-04: .4 mg via INTRAVENOUS

## 2012-09-04 MED ORDER — HYDROMORPHONE HCL PF 1 MG/ML IJ SOLN
1.0000 mg | Freq: Once | INTRAMUSCULAR | Status: AC
  Administered 2012-09-04: 1 mg via INTRAVENOUS
  Filled 2012-09-04: qty 1

## 2012-09-04 MED ORDER — MORPHINE SULFATE 4 MG/ML IJ SOLN
4.0000 mg | Freq: Once | INTRAMUSCULAR | Status: AC
  Administered 2012-09-04: 4 mg via INTRAVENOUS
  Filled 2012-09-04: qty 1

## 2012-09-04 MED ORDER — MORPHINE SULFATE 4 MG/ML IJ SOLN
4.0000 mg | Freq: Once | INTRAMUSCULAR | Status: AC
  Administered 2012-09-04: 4 mg via INTRAVENOUS

## 2012-09-04 MED ORDER — ENOXAPARIN SODIUM 40 MG/0.4ML ~~LOC~~ SOLN
40.0000 mg | SUBCUTANEOUS | Status: DC
Start: 2012-09-04 — End: 2012-09-12
  Administered 2012-09-04 – 2012-09-12 (×9): 40 mg via SUBCUTANEOUS
  Filled 2012-09-04 (×9): qty 0.4

## 2012-09-04 MED ORDER — SODIUM CHLORIDE 0.9 % IR SOLN
Status: DC | PRN
  Administered 2012-09-04 (×2): 1000 mL

## 2012-09-04 MED ORDER — ROCURONIUM BROMIDE 100 MG/10ML IV SOLN
INTRAVENOUS | Status: DC | PRN
  Administered 2012-09-04: 35 mg via INTRAVENOUS
  Administered 2012-09-04: 5 mg via INTRAVENOUS
  Administered 2012-09-04: 10 mg via INTRAVENOUS

## 2012-09-04 MED ORDER — SODIUM CHLORIDE 0.9 % IV SOLN: INTRAVENOUS | Status: DC

## 2012-09-04 MED ORDER — ONDANSETRON HCL 4 MG/2ML IJ SOLN
4.0000 mg | Freq: Four times a day (QID) | INTRAMUSCULAR | Status: DC | PRN
  Administered 2012-09-04: 4 mg via INTRAVENOUS

## 2012-09-04 MED ORDER — SODIUM CHLORIDE 0.9 % IV SOLN
INTRAVENOUS | Status: AC
  Filled 2012-09-04: qty 1

## 2012-09-04 MED ORDER — FENTANYL CITRATE 0.05 MG/ML IJ SOLN
25.0000 ug | INTRAMUSCULAR | Status: DC | PRN
  Administered 2012-09-04 (×3): 50 ug via INTRAVENOUS

## 2012-09-04 MED ORDER — HYDROMORPHONE HCL PF 1 MG/ML IJ SOLN
1.0000 mg | INTRAMUSCULAR | Status: DC | PRN
  Administered 2012-09-04: 1 mg via INTRAVENOUS
  Administered 2012-09-05: 2 mg via INTRAVENOUS
  Administered 2012-09-05 – 2012-09-06 (×3): 1 mg via INTRAVENOUS
  Administered 2012-09-06 (×2): 2 mg via INTRAVENOUS
  Administered 2012-09-07 – 2012-09-09 (×7): 1 mg via INTRAVENOUS
  Administered 2012-09-09: 2 mg via INTRAVENOUS
  Administered 2012-09-10: 1 mg via INTRAVENOUS
  Administered 2012-09-10: 2 mg via INTRAVENOUS
  Filled 2012-09-04 (×4): qty 1
  Filled 2012-09-04: qty 2
  Filled 2012-09-04 (×5): qty 1
  Filled 2012-09-04 (×2): qty 2
  Filled 2012-09-04: qty 1
  Filled 2012-09-04 (×2): qty 2
  Filled 2012-09-04: qty 1
  Filled 2012-09-04: qty 2
  Filled 2012-09-04: qty 1

## 2012-09-04 MED ORDER — LACTATED RINGERS IV SOLN
INTRAVENOUS | Status: DC
  Administered 2012-09-04 – 2012-09-10 (×7): via INTRAVENOUS

## 2012-09-04 MED ORDER — ONDANSETRON HCL 4 MG/2ML IJ SOLN
4.0000 mg | Freq: Once | INTRAMUSCULAR | Status: DC
  Filled 2012-09-04: qty 2

## 2012-09-04 MED ORDER — MUPIROCIN 2 % EX OINT
TOPICAL_OINTMENT | CUTANEOUS | Status: AC
  Filled 2012-09-04: qty 22

## 2012-09-04 MED ORDER — MIDAZOLAM HCL 2 MG/2ML IJ SOLN
1.0000 mg | INTRAMUSCULAR | Status: DC | PRN
  Administered 2012-09-04: 2 mg via INTRAVENOUS

## 2012-09-04 MED ORDER — LIDOCAINE HCL (PF) 1 % IJ SOLN
INTRAMUSCULAR | Status: AC
  Filled 2012-09-04: qty 5

## 2012-09-04 MED ORDER — LATANOPROST 0.005 % OP SOLN
1.0000 [drp] | Freq: Every day | OPHTHALMIC | Status: DC
  Administered 2012-09-05 – 2012-09-12 (×8): 1 [drp] via OPHTHALMIC
  Filled 2012-09-04: qty 2.5

## 2012-09-04 MED ORDER — FENTANYL CITRATE 0.05 MG/ML IJ SOLN
INTRAMUSCULAR | Status: AC
  Filled 2012-09-04: qty 5

## 2012-09-04 MED ORDER — MUPIROCIN 2 % EX OINT
TOPICAL_OINTMENT | Freq: Once | CUTANEOUS | Status: AC
  Administered 2012-09-04: 1 via NASAL

## 2012-09-04 MED ORDER — PANTOPRAZOLE SODIUM 40 MG IV SOLR
40.0000 mg | Freq: Every day | INTRAVENOUS | Status: DC
  Administered 2012-09-04 – 2012-09-09 (×6): 40 mg via INTRAVENOUS
  Filled 2012-09-04 (×6): qty 40

## 2012-09-04 MED ORDER — ONDANSETRON HCL 4 MG/2ML IJ SOLN
4.0000 mg | Freq: Once | INTRAMUSCULAR | Status: AC | PRN
  Administered 2012-09-04: 4 mg via INTRAVENOUS

## 2012-09-04 MED ORDER — HEMOSTATIC AGENTS (NO CHARGE) OPTIME
TOPICAL | Status: DC | PRN
  Administered 2012-09-04: 1 via TOPICAL

## 2012-09-04 SURGICAL SUPPLY — 54 items
APPLIER CLIP 11 MED OPEN (CLIP)
APPLIER CLIP 13 LRG OPEN (CLIP)
BAG HAMPER (MISCELLANEOUS) ×2 IMPLANT
BARRIER SKIN 2 3/4 (OSTOMY) IMPLANT
CELLS DAT CNTRL 66122 CELL SVR (MISCELLANEOUS) IMPLANT
CLAMP POUCH DRAINAGE QUIET (OSTOMY) IMPLANT
CLIP APPLIE 11 MED OPEN (CLIP) IMPLANT
CLIP APPLIE 13 LRG OPEN (CLIP) IMPLANT
CLOTH BEACON ORANGE TIMEOUT ST (SAFETY) ×2 IMPLANT
COVER LIGHT HANDLE STERIS (MISCELLANEOUS) ×4 IMPLANT
DRAPE WARM FLUID 44X44 (DRAPE) ×2 IMPLANT
DURAPREP 26ML APPLICATOR (WOUND CARE) ×2 IMPLANT
ELECT BLADE 6 FLAT ULTRCLN (ELECTRODE) ×2 IMPLANT
ELECT REM PT RETURN 9FT ADLT (ELECTROSURGICAL) ×2
ELECTRODE REM PT RTRN 9FT ADLT (ELECTROSURGICAL) ×1 IMPLANT
GLOVE BIOGEL PI IND STRL 7.0 (GLOVE) ×1 IMPLANT
GLOVE BIOGEL PI IND STRL 7.5 (GLOVE) ×1 IMPLANT
GLOVE BIOGEL PI INDICATOR 7.0 (GLOVE) ×1
GLOVE BIOGEL PI INDICATOR 7.5 (GLOVE) ×1
GLOVE ECLIPSE 6.5 STRL STRAW (GLOVE) ×2 IMPLANT
GLOVE ECLIPSE 7.0 STRL STRAW (GLOVE) ×4 IMPLANT
GLOVE EXAM NITRILE MD LF STRL (GLOVE) ×2 IMPLANT
GOWN STRL REIN XL XLG (GOWN DISPOSABLE) ×6 IMPLANT
INST SET MAJOR GENERAL (KITS) ×2 IMPLANT
KIT ROOM TURNOVER APOR (KITS) ×2 IMPLANT
MANIFOLD NEPTUNE II (INSTRUMENTS) ×2 IMPLANT
NS IRRIG 1000ML POUR BTL (IV SOLUTION) ×4 IMPLANT
PACK ABDOMINAL MAJOR (CUSTOM PROCEDURE TRAY) ×2 IMPLANT
PAD ARMBOARD 7.5X6 YLW CONV (MISCELLANEOUS) ×2 IMPLANT
POUCH OSTOMY 2 3/4  H 3804 (WOUND CARE)
POUCH OSTOMY 2 PC DRNBL 2.75 (WOUND CARE) IMPLANT
RELOAD LINEAR CUT PROX 55 BLUE (ENDOMECHANICALS) ×6 IMPLANT
RELOAD PROXIMATE 75MM BLUE (ENDOMECHANICALS) IMPLANT
RETRACTOR WND ALEXIS 25 LRG (MISCELLANEOUS) IMPLANT
RTRCTR WOUND ALEXIS 18CM MED (MISCELLANEOUS)
RTRCTR WOUND ALEXIS 25CM LRG (MISCELLANEOUS)
SEALER TISSUE G2 CVD JAW 35 (ENDOMECHANICALS) ×2 IMPLANT
SEALER TISSUE G2 CVD JAW 45CM (ENDOMECHANICALS) ×2
SEPRAFILM MEMBRANE 5X6 (MISCELLANEOUS) ×2 IMPLANT
SET BASIN LINEN APH (SET/KITS/TRAYS/PACK) ×2 IMPLANT
SPONGE GAUZE 4X4 12PLY (GAUZE/BANDAGES/DRESSINGS) ×2 IMPLANT
STAPLER GUN LINEAR PROX 60 (STAPLE) IMPLANT
STAPLER PROXIMATE 55 BLUE (STAPLE) ×2 IMPLANT
STAPLER PROXIMATE 75MM BLUE (STAPLE) IMPLANT
STAPLER VISISTAT 35W (STAPLE) ×2 IMPLANT
SUCTION POOLE TIP (SUCTIONS) ×2 IMPLANT
SUT CHROMIC 0 SH (SUTURE) IMPLANT
SUT CHROMIC 2 0 SH (SUTURE) IMPLANT
SUT PDS AB 0 CTX 60 (SUTURE) ×4 IMPLANT
SUT SILK 2 0 (SUTURE) ×1
SUT SILK 2-0 18XBRD TIE 12 (SUTURE) ×1 IMPLANT
SUT SILK 3 0 SH CR/8 (SUTURE) ×4 IMPLANT
TAPE CLOTH SURG 4X10 WHT LF (GAUZE/BANDAGES/DRESSINGS) ×2 IMPLANT
TRAY FOLEY CATH 14FR (SET/KITS/TRAYS/PACK) ×2 IMPLANT

## 2012-09-04 NOTE — ED Notes (Signed)
Pt given small glass of ice water for trial per vo dr Adriana Simas, after talking with Dr. Caesar Bookman and requested oral trial. No s/s of pain at this time but pt states it still hurts " a little bit"

## 2012-09-04 NOTE — ED Notes (Signed)
Sudden onset of abdominal pain, nausea, vomiting and diarrhea x2

## 2012-09-04 NOTE — OR Nursing (Signed)
Urinalysis collected at 1449 and sent to Lab with specimen transport slip by T. Katherine Roan, RN

## 2012-09-04 NOTE — ED Provider Notes (Signed)
History     CSN: [id number]  Arrival date & time 09/04/12  0505   First MD Initiated Contact with Patient 09/04/12 [phone]      Chief Complaint  Patient presents with  . Abdominal Pain   Level V caveat due to urgent need for  Pain intervention  (Consider location/radiation/quality/duration/timing/severity/associated sxs/prior treatment) HPI  Per wife patient started having acute onset of diffuse abdominal pain about 3 AM this morning. He has had nausea and vomiting and some diarrhea. He will admit the pain is cramping. However patient refuses to answer any other questions. He just keeps repeating "I can't stand it " and "I need help" He has IBS however they've indicated this is different from his usual episodes. Wife reports he has had some clamminess.  PCP Dr. Christell Constant  Past Medical History  Diagnosis Date  . IBS (irritable bowel syndrome)   . Enlarged prostate   . Sinus congestion   . Sciatic pain   . COPD (chronic obstructive pulmonary disease)   . Glucagonoma     Past Surgical History  Procedure Laterality Date  . Hernia repair inguinal    . Hemorrhoid surgery      No family history on file.  History  Substance Use Topics  . Smoking status: Former Games developer  . Smokeless tobacco: Not on file  . Alcohol Use: No   Lives at home Lives with spouse   Review of Systems  All other systems reviewed and are negative.    Allergies  Meperidine hcl  Home Medications   Current Outpatient Rx  Name  Route  Sig  Dispense  Refill  . aspirin 81 MG tablet   Oral   Take 81 mg by mouth daily.           Marland Kitchen azelastine (ASTELIN) 137 MCG/SPRAY nasal spray   Nasal   Place 1 spray into the nose 1 day or 1 dose. Use in each nostril as directed   30 mL   12   . cephALEXin (KEFLEX) 500 MG capsule   Oral   Take 1 capsule (500 mg total) by mouth 2 (two) times daily.   20 capsule   0   . doxazosin (CARDURA) 4 MG tablet   Oral   Take 4 mg by mouth at bedtime.           .  fluticasone (FLONASE) 50 MCG/ACT nasal spray   Nasal   Place 1 spray into the nose daily.         Marland Kitchen latanoprost (XALATAN) 0.005 % ophthalmic solution   Left Eye   Place 1 drop into the left eye daily.         . TUDORZA PRESSAIR 400 MCG/ACT AEPB      1 puff daily.           BP 135/113  Pulse 66  Ht 5\' 8"  (1.727 m)  Wt 175 lb (79.379 kg)  BMI 26.61 kg/m2  SpO2 100%  Vital signs normal    Physical Exam  Nursing note and vitals reviewed. Constitutional: He is oriented to person, place, and time. He appears well-developed and well-nourished.  Non-toxic appearance. He does not appear ill. He appears distressed.  HENT:  Head: Normocephalic and atraumatic.  Right Ear: External ear normal.  Left Ear: External ear normal.  Nose: Nose normal. No mucosal edema or rhinorrhea.  Mouth/Throat: Oropharynx is clear and moist and mucous membranes are normal. No dental abscesses or edematous.  Eyes: Conjunctivae and EOM are  normal. Pupils are equal, round, and reactive to light.  Neck: Normal range of motion and full passive range of motion without pain. Neck supple.  Cardiovascular: Normal rate, regular rhythm and normal heart sounds.  Exam reveals no gallop and no friction rub.   No murmur heard. Pulmonary/Chest: Effort normal and breath sounds normal. No respiratory distress. He has no wheezes. He has no rhonchi. He has no rales. He exhibits no tenderness and no crepitus.  Abdominal: Soft. Normal appearance and bowel sounds are normal. He exhibits no distension. There is no tenderness. There is no rebound and no guarding.  Although patient complains of diffuse abdominal pain he has no pain to palpation  Musculoskeletal: Normal range of motion. He exhibits no edema and no tenderness.  Moves all extremities well.   Neurological: He is alert and oriented to person, place, and time. He has normal strength. No cranial nerve deficit.  Skin: Skin is warm, dry and intact. No rash noted. No  erythema. There is pallor.  Psychiatric: He has a normal mood and affect. His speech is normal and behavior is normal. His mood appears not anxious.    ED Course  Procedures (including critical care time)  Medications  0.9 %  sodium chloride infusion (not administered)  ondansetron (ZOFRAN) injection 4 mg (4 mg Intravenous Given 09/04/12 0609)  ondansetron (ZOFRAN) injection 4 mg (not administered)  morphine 4 MG/ML injection 4 mg (4 mg Intravenous Given 09/04/12 0537)  morphine 4 MG/ML injection 4 mg (4 mg Intravenous Given 09/04/12 0549)  HYDROmorphone (DILAUDID) injection 1 mg (1 mg Intravenous Given 09/04/12 0609)  HYDROmorphone (DILAUDID) injection 1 mg (1 mg Intravenous Given 09/04/12 0642)   Pt had AP CT scan done in 2004 for left ureteral stone.   06:30 Continues to c/o pain. Waiting for CT scan to be done.   Pt left with Dr Adriana Simas at change of shift to get CT results.   Results for orders placed during the hospital encounter of 09/04/12  CBC WITH DIFFERENTIAL      Result Value Range   WBC 8.4  4.0 - 10.5 K/uL   RBC 4.56  4.22 - 5.81 MIL/uL   Hemoglobin 13.8  13.0 - 17.0 g/dL   HCT 09.6  04.5 - 40.9 %   MCV 92.3  78.0 - 100.0 fL   MCH 30.3  26.0 - 34.0 pg   MCHC 32.8  30.0 - 36.0 g/dL   RDW 81.1  91.4 - 78.2 %   Platelets 231  150 - 400 K/uL   Neutrophils Relative 67  43 - 77 %   Neutro Abs 5.7  1.7 - 7.7 K/uL   Lymphocytes Relative 23  12 - 46 %   Lymphs Abs 1.9  0.7 - 4.0 K/uL   Monocytes Relative 7  3 - 12 %   Monocytes Absolute 0.6  0.1 - 1.0 K/uL   Eosinophils Relative 3  0 - 5 %   Eosinophils Absolute 0.2  0.0 - 0.7 K/uL   Basophils Relative 0  0 - 1 %   Basophils Absolute 0.0  0.0 - 0.1 K/uL  COMPREHENSIVE METABOLIC PANEL      Result Value Range   Sodium 142  135 - 145 mEq/L   Potassium 3.9  3.5 - 5.1 mEq/L   Chloride 106  96 - 112 mEq/L   CO2 26  19 - 32 mEq/L   Glucose, Bld 126 (*) 70 - 99 mg/dL   BUN 17  6 -  23 mg/dL   Creatinine, Ser 0.98 (*) 0.50 -  1.35 mg/dL   Calcium 9.0  8.4 - 11.9 mg/dL   Total Protein 6.5  6.0 - 8.3 g/dL   Albumin 3.2 (*) 3.5 - 5.2 g/dL   AST 15  0 - 37 U/L   ALT 10  0 - 53 U/L   Alkaline Phosphatase 83  39 - 117 U/L   Total Bilirubin 0.2 (*) 0.3 - 1.2 mg/dL   GFR calc non Af Amer 46 (*) >90 mL/min   GFR calc Af Amer 53 (*) >90 mL/min  APTT      Result Value Range   aPTT 28  24 - 37 seconds  PROTIME-INR      Result Value Range   Prothrombin Time 13.5  11.6 - 15.2 seconds   INR 1.04  0.00 - 1.49  LIPASE, BLOOD      Result Value Range   Lipase 51  11 - 59 U/L  SAMPLE TO BLOOD BANK      Result Value Range   Blood Bank Specimen SAMPLE AVAILABLE FOR TESTING     Sample Expiration 09/07/2012       Laboratory interpretation all normal, except UA pending    AP CT pending    Date: 09/04/2012  Rate: 61  Rhythm: normal sinus rhythm  QRS Axis: normal  Intervals: normal  ST/T Wave abnormalities: normal  Conduction Disutrbances:none  Narrative Interpretation:   Old EKG Reviewed: none available      1. Abdominal pain, acute     Disposition per Dr Jill Poling, MD, FACEP   MDM          Ward Givens, MD 09/04/12 [phone]

## 2012-09-04 NOTE — Transfer of Care (Signed)
Immediate Anesthesia Transfer of Care Note  Patient: Charles Golden  Procedure(s) Performed: Procedure(s): EXPLORATORY LAPAROTOMY (N/A) SMALL BOWEL RESECTION (N/A)  Patient Location: PACU  Anesthesia Type:General  Level of Consciousness: awake, alert  and oriented  Airway & Oxygen Therapy: Patient Spontanous Breathing  Post-op Assessment: Report given to PACU RN  Post vital signs: Reviewed and stable  Complications: No apparent anesthesia complications

## 2012-09-04 NOTE — Anesthesia Preprocedure Evaluation (Signed)
Anesthesia Evaluation  Patient identified by MRN, date of birth, ID band Patient awake    Reviewed: Allergy & Precautions, H&P , NPO status , Patient's Chart, lab work & pertinent test results  Airway Mallampati: I TM Distance: >3 FB Neck ROM: Full    Dental  (+) Partial Upper and Poor Dentition   Pulmonary COPD (emphysema)former smoker,  breath sounds clear to auscultation        Cardiovascular hypertension, Pt. on medications Rhythm:Regular Rate:Normal     Neuro/Psych  Neuromuscular disease    GI/Hepatic SBO, possible volvulus   Endo/Other    Renal/GU Renal disease (hydronephrosis, kidnet stones)     Musculoskeletal   Abdominal   Peds  Hematology   Anesthesia Other Findings   Reproductive/Obstetrics                           Anesthesia Physical Anesthesia Plan  ASA: III  Anesthesia Plan: General   Post-op Pain Management:    Induction: Intravenous, Rapid sequence and Cricoid pressure planned  Airway Management Planned: Oral ETT  Additional Equipment:   Intra-op Plan:   Post-operative Plan: Extubation in OR  Informed Consent: I have reviewed the patients History and Physical, chart, labs and discussed the procedure including the risks, benefits and alternatives for the proposed anesthesia with the patient or authorized representative who has indicated his/her understanding and acceptance.     Plan Discussed with:   Anesthesia Plan Comments: (amidate induction )        Anesthesia Quick Evaluation

## 2012-09-04 NOTE — ED Notes (Signed)
Patient states he can not give a urine sample at this time. Patient states he needs something for pain. RN notified.

## 2012-09-04 NOTE — ED Notes (Signed)
Pt transported to CT ?

## 2012-09-04 NOTE — Anesthesia Postprocedure Evaluation (Addendum)
  Anesthesia Post-op Note  Patient: Charles Golden  Procedure(s) Performed: Procedure(s): EXPLORATORY LAPAROTOMY (N/A) SMALL BOWEL RESECTION (N/A)  Patient Location: PACU  Anesthesia Type:General  Level of Consciousness: awake, alert  and oriented  Airway and Oxygen Therapy: Patient Spontanous Breathing and Patient connected to face mask oxygen  Post-op Pain: mild  Post-op Assessment: Post-op Vital signs reviewed, Patient's Cardiovascular Status Stable, Respiratory Function Stable, Patent Airway and No signs of Nausea or vomiting  Post-op Vital Signs: Reviewed and stable  Complications: No apparent anesthesia complications 09/05/12  Patient doing well.  VSS.  No apparent anesthesia com[plications.

## 2012-09-04 NOTE — ED Provider Notes (Signed)
History     CSN: [id number]  Arrival date & time 09/04/12  0505   First MD Initiated Contact with Patient 09/04/12 [phone]      Chief Complaint  Patient presents with  . Abdominal Pain    (Consider location/radiation/quality/duration/timing/severity/associated sxs/prior treatment) HPI  Past Medical History  Diagnosis Date  . IBS (irritable bowel syndrome)   . Enlarged prostate   . Sinus congestion   . Sciatic pain   . COPD (chronic obstructive pulmonary disease)   . Glucagonoma     Past Surgical History  Procedure Laterality Date  . Hernia repair    . Hemorrhoid surgery      No family history on file.  History  Substance Use Topics  . Smoking status: Former Games developer  . Smokeless tobacco: Not on file  . Alcohol Use: No      Review of Systems  Allergies  Meperidine hcl  Home Medications   Current Outpatient Rx  Name  Route  Sig  Dispense  Refill  . aspirin 81 MG tablet   Oral   Take 81 mg by mouth daily.           Marland Kitchen azelastine (ASTELIN) 137 MCG/SPRAY nasal spray   Nasal   Place 1 spray into the nose 1 day or 1 dose. Use in each nostril as directed   30 mL   12   . cephALEXin (KEFLEX) 500 MG capsule   Oral   Take 1 capsule (500 mg total) by mouth 2 (two) times daily.   20 capsule   0   . doxazosin (CARDURA) 4 MG tablet   Oral   Take 4 mg by mouth at bedtime.           . fluticasone (FLONASE) 50 MCG/ACT nasal spray   Nasal   Place 1 spray into the nose daily.         Marland Kitchen latanoprost (XALATAN) 0.005 % ophthalmic solution   Left Eye   Place 1 drop into the left eye daily.         . TUDORZA PRESSAIR 400 MCG/ACT AEPB      1 puff daily.           BP 139/71  Pulse 71  Resp 17  Ht 5\' 8"  (1.727 m)  Wt 175 lb (79.379 kg)  BMI 26.61 kg/m2  SpO2 94%  Physical Exam  ED Course  Procedures (including critical care time)  Labs Reviewed  COMPREHENSIVE METABOLIC PANEL - Abnormal; Notable for the following:    Glucose, Bld 126 (*)     Creatinine, Ser 1.37 (*)    Albumin 3.2 (*)    Total Bilirubin 0.2 (*)    GFR calc non Af Amer 46 (*)    GFR calc Af Amer 53 (*)    All other components within normal limits  CBC WITH DIFFERENTIAL  APTT  PROTIME-INR  LIPASE, BLOOD  URINALYSIS, ROUTINE W REFLEX MICROSCOPIC  SAMPLE TO BLOOD BANK   Ct Abdomen Pelvis Wo Contrast  09/04/2012  *RADIOLOGY REPORT*  Clinical Data: Acute onset of diffuse abdominal pain with vomiting and diarrhea.  CT ABDOMEN AND PELVIS WITHOUT CONTRAST  Technique:  Multidetector CT imaging of the abdomen and pelvis was performed following the standard protocol without intravenous contrast.  Comparison: 11/14/2002  Findings: Pleural-based densities at the right lung bases are suggestive for scarring and were present on the previous examination.  There is no evidence for free intraperitoneal air.  Unenhanced CT was performed per clinician  order.  Lack of IV contrast limits sensitivity and specificity, especially for evaluation of abdominal/pelvic solid viscera.  There is a small amount of perihepatic ascites.  Otherwise, no gross abnormality to the liver and gallbladder.  A few calcifications within the spleen.  No gross abnormality to the pancreas or adrenal tissue.  There are low density structure in both kidneys suggestive for cysts.  The largest is in the right kidney and measures 6.7 cm.  Two stones within the right kidney and the largest measures 5 mm. There are at least four small stones within the left kidney.  No evidence for hydronephrosis. There is a high density structure along the posterior base of the bladder. The adjacent prostate is markedly enlarged, measuring 7.3 cm in the transverse diameter.  It is unclear if this bladder based structure represents a bladder lesion or nodular extension of the prostate. The nodular structure has the same density as the adjacent prostate.  There are fluid-filled and mildly dilated loops of small bowel within the anterior mid  abdomen.  There is mesenteric stranding and edema associated with these loops of small bowel.  There is concern for a closed loop obstruction with the transition point on sequence 2, image 48.  Small amount of fluid in the pelvis and the lower abdominal quadrants.  The appendix appears to be normal in the right abdomen.  Multilevel degenerative changes in the lower lumbar spine.  IMPRESSION: Mesenteric edema associated with dilated loops of small bowel in the anterior mid abdomen.  The configuration is concerning for a closed loop small bowel obstruction.  Small amount of fluid in the abdomen and pelvis.  The mesenteric edema pattern raises concern for vascular congestion and the bowel may be at risk for ischemia.  Bilateral nephrolithiasis without hydronephrosis.  Large nodular structure along the posterior aspect of the bladder. This could be associated with the prostate gland but cannot exclude a bladder lesion.  Suggest a follow-up Urology evaluation.  These results were called by telephone on 09/04/2012 at 7:30 a.m. to Dr. Adriana Simas, who verbally acknowledged these results.   Original Report Authenticated By: Richarda Overlie, M.D.      1. Abdominal pain, acute       MDM  Discussed with general surgery.  Dr. Leticia Penna will admit.        Donnetta Hutching, MD 09/06/12 1236

## 2012-09-04 NOTE — ED Notes (Signed)
Pt reports inability to void.  Refusing in and out cath at this time.

## 2012-09-04 NOTE — H&P (Signed)
Charles Golden is an 79 y.o. male.   Chief Complaint: Abdominal pain HPI: Patient presented to [facility] emergency department this morning with increasing diffuse abdominal pain. Pain is described as sharp and constant. It was improved slightly with pain medication but since his admission from the emergency department has progressed and worsened. He's had no similar symptomatology in the past. He is nauseated with associated nonbloody emesis. He has had several weeks of diarrhea but has a history of IBS and states this is standard for his IBS symptomatology. He did have a bowel movement this morning. No melena no hematochezia. Bowel movement this morning was loose consistent with diarrhea. He's had no change with urination. No dysuria. No hematochezia. No history of abdominal surgery. No history of cardiac or pulmonary disease.  Past Medical History  Diagnosis Date  . IBS (irritable bowel syndrome)   . Enlarged prostate   . Sinus congestion   . Sciatic pain   . COPD (chronic obstructive pulmonary disease)   . Glucagonoma     Past Surgical History  Procedure Laterality Date  . Hernia repair    . Hemorrhoid surgery      No family history on file. Social History:  reports that he has quit smoking. He does not have any smokeless tobacco history on file. He reports that he does not drink alcohol or use illicit drugs.  Allergies:  Allergies  Allergen Reactions  . Meperidine Hcl Other (See Comments)    Patient can't remember.    Medications Prior to Admission  Medication Sig Dispense Refill  . aspirin 81 MG tablet Take 81 mg by mouth daily.        Marland Kitchen doxazosin (CARDURA) 4 MG tablet Take 4 mg by mouth at bedtime.        . fluticasone (FLONASE) 50 MCG/ACT nasal spray Place 1 spray into the nose daily.      Marland Kitchen latanoprost (XALATAN) 0.005 % ophthalmic solution Place 1 drop into the left eye daily.      . TUDORZA PRESSAIR 400 MCG/ACT AEPB 1 puff daily.      Marland Kitchen azelastine (ASTELIN) 137 MCG/SPRAY  nasal spray Place 1 spray into the nose 1 day or 1 dose. Use in each nostril as directed  30 mL  12  . cephALEXin (KEFLEX) 500 MG capsule Take 1 capsule (500 mg total) by mouth 2 (two) times daily.  20 capsule  0    Results for orders placed during the hospital encounter of 09/04/12 (from the past 48 hour(s))  CBC WITH DIFFERENTIAL     Status: None   Collection Time    09/04/12  5:26 AM      Result Value Range   WBC 8.4  4.0 - 10.5 K/uL   RBC 4.56  4.22 - 5.81 MIL/uL   Hemoglobin 13.8  13.0 - 17.0 g/dL   HCT 16.1  09.6 - 04.5 %   MCV 92.3  78.0 - 100.0 fL   MCH 30.3  26.0 - 34.0 pg   MCHC 32.8  30.0 - 36.0 g/dL   RDW 40.9  81.1 - 91.4 %   Platelets 231  150 - 400 K/uL   Neutrophils Relative 67  43 - 77 %   Neutro Abs 5.7  1.7 - 7.7 K/uL   Lymphocytes Relative 23  12 - 46 %   Lymphs Abs 1.9  0.7 - 4.0 K/uL   Monocytes Relative 7  3 - 12 %   Monocytes Absolute 0.6  0.1 -  1.0 K/uL   Eosinophils Relative 3  0 - 5 %   Eosinophils Absolute 0.2  0.0 - 0.7 K/uL   Basophils Relative 0  0 - 1 %   Basophils Absolute 0.0  0.0 - 0.1 K/uL  COMPREHENSIVE METABOLIC PANEL     Status: Abnormal   Collection Time    09/04/12  5:26 AM      Result Value Range   Sodium 142  135 - 145 mEq/L   Potassium 3.9  3.5 - 5.1 mEq/L   Chloride 106  96 - 112 mEq/L   CO2 26  19 - 32 mEq/L   Glucose, Bld 126 (*) 70 - 99 mg/dL   BUN 17  6 - 23 mg/dL   Creatinine, Ser 1.61 (*) 0.50 - 1.35 mg/dL   Calcium 9.0  8.4 - 09.6 mg/dL   Total Protein 6.5  6.0 - 8.3 g/dL   Albumin 3.2 (*) 3.5 - 5.2 g/dL   AST 15  0 - 37 U/L   ALT 10  0 - 53 U/L   Alkaline Phosphatase 83  39 - 117 U/L   Total Bilirubin 0.2 (*) 0.3 - 1.2 mg/dL   GFR calc non Af Amer 46 (*) >90 mL/min   GFR calc Af Amer 53 (*) >90 mL/min   Comment:            The eGFR has been calculated     using the CKD EPI equation.     This calculation has not been     validated in all clinical     situations.     eGFR's persistently     <90 mL/min signify      possible Chronic Kidney Disease.  APTT     Status: None   Collection Time    09/04/12  5:26 AM      Result Value Range   aPTT 28  24 - 37 seconds  PROTIME-INR     Status: None   Collection Time    09/04/12  5:26 AM      Result Value Range   Prothrombin Time 13.5  11.6 - 15.2 seconds   INR 1.04  0.00 - 1.49  SAMPLE TO BLOOD BANK     Status: None   Collection Time    09/04/12  5:26 AM      Result Value Range   Blood Bank Specimen SAMPLE AVAILABLE FOR TESTING     Sample Expiration 09/07/2012    LIPASE, BLOOD     Status: None   Collection Time    09/04/12  5:33 AM      Result Value Range   Lipase 51  11 - 59 U/L   Ct Abdomen Pelvis Wo Contrast  09/04/2012  *RADIOLOGY REPORT*  Clinical Data: Acute onset of diffuse abdominal pain with vomiting and diarrhea.  CT ABDOMEN AND PELVIS WITHOUT CONTRAST  Technique:  Multidetector CT imaging of the abdomen and pelvis was performed following the standard protocol without intravenous contrast.  Comparison: 11/14/2002  Findings: Pleural-based densities at the right lung bases are suggestive for scarring and were present on the previous examination.  There is no evidence for free intraperitoneal air.  Unenhanced CT was performed per clinician order.  Lack of IV contrast limits sensitivity and specificity, especially for evaluation of abdominal/pelvic solid viscera.  There is a small amount of perihepatic ascites.  Otherwise, no gross abnormality to the liver and gallbladder.  A few calcifications within the spleen.  No gross abnormality to  the pancreas or adrenal tissue.  There are low density structure in both kidneys suggestive for cysts.  The largest is in the right kidney and measures 6.7 cm.  Two stones within the right kidney and the largest measures 5 mm. There are at least four small stones within the left kidney.  No evidence for hydronephrosis. There is a high density structure along the posterior base of the bladder. The adjacent prostate is  markedly enlarged, measuring 7.3 cm in the transverse diameter.  It is unclear if this bladder based structure represents a bladder lesion or nodular extension of the prostate. The nodular structure has the same density as the adjacent prostate.  There are fluid-filled and mildly dilated loops of small bowel within the anterior mid abdomen.  There is mesenteric stranding and edema associated with these loops of small bowel.  There is concern for a closed loop obstruction with the transition point on sequence 2, image 48.  Small amount of fluid in the pelvis and the lower abdominal quadrants.  The appendix appears to be normal in the right abdomen.  Multilevel degenerative changes in the lower lumbar spine.  IMPRESSION: Mesenteric edema associated with dilated loops of small bowel in the anterior mid abdomen.  The configuration is concerning for a closed loop small bowel obstruction.  Small amount of fluid in the abdomen and pelvis.  The mesenteric edema pattern raises concern for vascular congestion and the bowel may be at risk for ischemia.  Bilateral nephrolithiasis without hydronephrosis.  Large nodular structure along the posterior aspect of the bladder. This could be associated with the prostate gland but cannot exclude a bladder lesion.  Suggest a follow-up Urology evaluation.  These results were called by telephone on 09/04/2012 at 7:30 a.m. to Dr. Adriana Simas, who verbally acknowledged these results.   Original Report Authenticated By: Richarda Overlie, M.D.     Review of Systems  Constitutional: Positive for diaphoresis.  HENT: Negative.   Eyes: Negative.   Respiratory: Negative.   Cardiovascular: Negative.   Gastrointestinal: Positive for nausea, vomiting, abdominal pain (diffuse) and diarrhea. Negative for constipation, blood in stool and melena.  Genitourinary: Negative.   Musculoskeletal: Negative.   Neurological: Negative.   Endo/Heme/Allergies: Negative.   Psychiatric/Behavioral: Negative.      Blood pressure 124/64, pulse 60, temperature 97.8 F (36.6 C), temperature source Oral, resp. rate 18, height 5\' 10"  (1.778 m), weight 81.7 kg (180 lb 1.9 oz), SpO2 97.00%. Physical Exam  Constitutional: He is oriented to person, place, and time. He appears well-developed and well-nourished. No distress.  Elderly, uncomfortable in appearance  HENT:  Head: Normocephalic and atraumatic.  Hard of hearing  Eyes: Conjunctivae and EOM are normal. Pupils are equal, round, and reactive to light.  Neck: Normal range of motion. Neck supple. No tracheal deviation present. No thyromegaly present.  Cardiovascular: Normal rate, regular rhythm and normal heart sounds.   Respiratory: Effort normal and breath sounds normal. No respiratory distress.  GI: He exhibits no mass. There is tenderness (diffuse). There is rebound and guarding.  Lymphadenopathy:    He has no cervical adenopathy.  Neurological: He is alert and oriented to person, place, and time.  Skin: Skin is warm and dry.     Assessment/Plan Acute abdominal pain. Possible volvulus. Patient's symptomatology has continued. Patient is currently n.p.o. on IV fluid hydration. Given his worsening abdominal pain and clinical findings I did discuss with the patient and family exploratory laparotomy. Risk including but not limited to risk of bleeding, infection,  intraoperative cardiac and pulmonary events were all discussed. Possible need for bowel resection possible ostomy were also discussed. Her questions and concerns are addressed the patient will be consented and scheduled for emergent exploration.  Aniket Paye C 09/04/2012, 1:28 PM

## 2012-09-04 NOTE — ED Notes (Signed)
Pt reporting continued abdominal pain.  Denies relief from pain medication previously given.

## 2012-09-05 LAB — BASIC METABOLIC PANEL
BUN: 17 mg/dL (ref 6–23)
CO2: 28 mEq/L (ref 19–32)
Calcium: 8.3 mg/dL — ABNORMAL LOW (ref 8.4–10.5)
Chloride: 104 mEq/L (ref 96–112)
Creatinine, Ser: 1.37 mg/dL — ABNORMAL HIGH (ref 0.50–1.35)
GFR calc Af Amer: 53 mL/min — ABNORMAL LOW (ref >90)
GFR calc non Af Amer: 46 mL/min — ABNORMAL LOW (ref >90)
Glucose, Bld: 123 mg/dL — ABNORMAL HIGH (ref 70–99)
Potassium: 5 mEq/L (ref 3.5–5.1)
Sodium: 138 mEq/L (ref 135–145)

## 2012-09-05 LAB — CBC
HCT: 39.3 % (ref 39.0–52.0)
Hemoglobin: 12.8 g/dL — ABNORMAL LOW (ref 13.0–17.0)
MCH: 30.3 pg (ref 26.0–34.0)
MCHC: 32.6 g/dL (ref 30.0–36.0)
MCV: 93.1 fL (ref 78.0–100.0)
Platelets: 234 10*3/uL (ref 150–400)
RBC: 4.22 MIL/uL (ref 4.22–5.81)
RDW: 13.9 % (ref 11.5–15.5)
WBC: 11.8 10*3/uL — ABNORMAL HIGH (ref 4.0–10.5)

## 2012-09-05 MED ORDER — HYDROMORPHONE 0.3 MG/ML IV SOLN
INTRAVENOUS | Status: DC
  Filled 2012-09-05: qty 25

## 2012-09-05 MED ORDER — DIPHENHYDRAMINE HCL 12.5 MG/5ML PO ELIX: 12.5000 mg | ORAL_SOLUTION | Freq: Four times a day (QID) | ORAL | Status: DC | PRN

## 2012-09-05 MED ORDER — MENTHOL 3 MG MT LOZG
1.0000 | LOZENGE | OROMUCOSAL | Status: DC | PRN
  Administered 2012-09-05: 3 mg via ORAL
  Filled 2012-09-05: qty 9

## 2012-09-05 MED ORDER — DIAZEPAM 5 MG/ML IJ SOLN: 5.0000 mg | Freq: Three times a day (TID) | INTRAMUSCULAR | Status: DC | PRN

## 2012-09-05 MED ORDER — DIPHENHYDRAMINE HCL 50 MG/ML IJ SOLN: 12.5000 mg | Freq: Four times a day (QID) | INTRAMUSCULAR | Status: DC | PRN

## 2012-09-05 MED ORDER — NALOXONE HCL 0.4 MG/ML IJ SOLN: 0.4000 mg | INTRAMUSCULAR | Status: DC | PRN

## 2012-09-05 MED ORDER — SODIUM CHLORIDE 0.9 % IJ SOLN: 9.0000 mL | INTRAMUSCULAR | Status: DC | PRN

## 2012-09-05 NOTE — Op Note (Signed)
Patient:  Charles Golden  DOB:  11/23/1925  MRN:  [id number]   Preop Diagnosis:  Acute abdomen, suspected volvulus  Postop Diagnosis:  Small bowel volvulus  Procedure:  Exploratory laparotomy with small bowel resection and primary anastomosis  Surgeon:  Dr. Tilford Pillar  Anes:  General endotracheal  Indications:  Patient is an 60 grown male presented to [facility] emergency department with increasing diffuse abdominal pain. CT evaluation of his abdomen was suspicious for a local obstruction and on physical examination patient's pain continued to worsen and patient was noted to have acute abdomen. Risks benefits alternatives of exploratory laparotomy were discussed possible bowel resection possible ostomy. Risk of bleeding, it infection, anastomotic leaks was performed, intraoperative cardiac and pulmonary ventral discussed with patient. His questions and concerns are addressed the patient as consented for the planned procedure.  Procedure note:  Patient is taken to the operator is placed in supine position the or table time the general anesthetic is a Optician, dispensing. Once patient was asleep he was endotracheally intubated by the nurse anesthetist. At this point a Foley catheter is placed in standard sterile fashion by the operative staff. His abdomen is prepped with DuraPrep solution and draped in standard fashion. Time out was performed. A midline incision was created with a 10 blade scalpel with a right lateral keyhole defect around the umbilicus. Additional dissection down to subcuticular tissues carried using electrocautery including the division of the anterior fascia. The fascia is grasped and lifted anteriorly with Coker clamps. Sharp Metzenbaum dissection is carried out to enter into the peritoneum. The peritoneum was opened both superiorly and inferiorly. At this point clear ascitic fluid counter. No odor was noted. The small bowel is identified and it began to run the bowel which was noted to be  in a distal direction. The bowel appear to course deep and during my manipulation I was able to appreciate a small band adhesion. This was disrupted with digital dissection and allowed me to deliver the remainder of the small intestine up into the field. The involves small bowel is hyperemic. The mesenteric demonstrated venous congestion. While the majority of the involves small intestine did appear viable there was a short segment which I was concerned was not viable. There is no evidence of perforation or any spillage. There is no gross necrosis however the appearance was concerning and I opted to resect this segment. A window was created in the mesentery both proximal and distally to the planned area division. A GIA 55 stapler load was utilized to divide the bowel proximally and distally. The mesentery was then ligated and divided using the Enseal bipolar device for this segment. Once this segment was free it was placed on the back table sent as a permanent specimen. At this point hemostasis was excellent. I did pexed the 2 ends the divided bowel in a side-to-side fashion with a 3-0 silk suture. An enterotomy was created both segments and a reload of the GIA 55 stapler was utilized to create the stapled side-to-side anastomosis. The remaining enterotomy was closed with 3-0 silk in simple or fashion. An additional 3-0 silk was placed at the apex of the anastomosis for additional reinforcement. The mesentery defect was approximated using 3-0 silk interrupted suture. At this point I continued to run the small bowel to the terminal ileum. The remainder of the bowel appeared viable and not involved. The cecum was inspected. The appendix appeared normal. The transverse and descending colon are normal. The rectum and sigmoid colon were  noted to be normal. No other abnormalities were noted. The stomach was grossly normal. The liver and spleen appear normal. At this time I did run the small intestine one final time. As  quite pleased with the appearance. The abdominal cavity was copiously irrigated with warm saline. The returning aspirate was clear. The bowel was returned back into the peritoneal cavity. The omentum was pulled back over the small intestine. A piece of Seprafilm was placed over the omentum prior to closure.  An oh looped PDS suture was utilized to reapproximate the fascia any running continuous fashion. The initial suture was started inferiorly was brought to the midportion of the incision. A second oh looped PDS was started superiorly and brought to the first. The sutures were secured to each other at the midpoint. The skin edges were then reapproximated using skin staples. The skin was washed dried moist dry towel. Sterile dressings were placed. The drapes removed. The dressings were secured. The patient was allowed to come out of general anesthetic and stretcher the PACU in stable condition. At the conclusion of procedure all instrument, sponge, needle counts are correct. Patient tolerated procedure extremely well.  Complications:  None apparent  EBL:  100 ML  Specimen:  Small bowel

## 2012-09-05 NOTE — Progress Notes (Signed)
Patient refuses PCA - does not want to wear nasal cannula and O2 monitor.  Asks to continue PRN pain meds instead

## 2012-09-05 NOTE — Progress Notes (Signed)
1 Day Post-Op  Subjective: Pain somewhat controlled.  No nausea.  NO fever or chills.  Objective: Vital signs in last 24 hours: Temp:  [98.2 F (36.8 C)-99 F (37.2 C)] 98.6 F (37 C) (04/11 0413) Pulse Rate:  [70-90] 90 (04/11 0413) Resp:  [12-18] 18 (04/11 0413) BP: (124-144)/(45-82) 132/79 mmHg (04/11 0413) SpO2:  [86 %-100 %] 93 % (04/11 0413) FiO2 (%):  [2 %] 2 % (04/10 1843) Last BM Date: 09/04/12  Intake/Output from previous day: 04/10 0701 - 04/11 0700 In: 1820 [P.O.:120; I.V.:1700] Out: 900 [Urine:800; Blood:100] Intake/Output this shift:    General appearance: alert and no distress GI: Quiet, soft, anticipated tenderness. No peritoneal signs.  Lab Results:   Recent Labs  09/04/12 0526 09/05/12 0526  WBC 8.4 11.8*  HGB 13.8 12.8*  HCT 42.1 39.3  PLT 231 234   BMET  Recent Labs  09/04/12 0526 09/05/12 0526  NA 142 138  K 3.9 5.0  CL 106 104  CO2 26 28  GLUCOSE 126* 123*  BUN 17 17  CREATININE 1.37* 1.37*  CALCIUM 9.0 8.3*   PT/INR  Recent Labs  09/04/12 0526  LABPROT 13.5  INR 1.04   ABG No results found for this basename: PHART, PCO2, PO2, HCO3,  in the last 72 hours  Studies/Results: Ct Abdomen Pelvis Wo Contrast  09/04/2012  *RADIOLOGY REPORT*  Clinical Data: Acute onset of diffuse abdominal pain with vomiting and diarrhea.  CT ABDOMEN AND PELVIS WITHOUT CONTRAST  Technique:  Multidetector CT imaging of the abdomen and pelvis was performed following the standard protocol without intravenous contrast.  Comparison: 11/14/2002  Findings: Pleural-based densities at the right lung bases are suggestive for scarring and were present on the previous examination.  There is no evidence for free intraperitoneal air.  Unenhanced CT was performed per clinician order.  Lack of IV contrast limits sensitivity and specificity, especially for evaluation of abdominal/pelvic solid viscera.  There is a small amount of perihepatic ascites.  Otherwise, no gross  abnormality to the liver and gallbladder.  A few calcifications within the spleen.  No gross abnormality to the pancreas or adrenal tissue.  There are low density structure in both kidneys suggestive for cysts.  The largest is in the right kidney and measures 6.7 cm.  Two stones within the right kidney and the largest measures 5 mm. There are at least four small stones within the left kidney.  No evidence for hydronephrosis. There is a high density structure along the posterior base of the bladder. The adjacent prostate is markedly enlarged, measuring 7.3 cm in the transverse diameter.  It is unclear if this bladder based structure represents a bladder lesion or nodular extension of the prostate. The nodular structure has the same density as the adjacent prostate.  There are fluid-filled and mildly dilated loops of small bowel within the anterior mid abdomen.  There is mesenteric stranding and edema associated with these loops of small bowel.  There is concern for a closed loop obstruction with the transition point on sequence 2, image 48.  Small amount of fluid in the pelvis and the lower abdominal quadrants.  The appendix appears to be normal in the right abdomen.  Multilevel degenerative changes in the lower lumbar spine.  IMPRESSION: Mesenteric edema associated with dilated loops of small bowel in the anterior mid abdomen.  The configuration is concerning for a closed loop small bowel obstruction.  Small amount of fluid in the abdomen and pelvis.  The mesenteric edema  pattern raises concern for vascular congestion and the bowel may be at risk for ischemia.  Bilateral nephrolithiasis without hydronephrosis.  Large nodular structure along the posterior aspect of the bladder. This could be associated with the prostate gland but cannot exclude a bladder lesion.  Suggest a follow-up Urology evaluation.  These results were called by telephone on 09/04/2012 at 7:30 a.m. to Dr. Adriana Simas, who verbally acknowledged these  results.   Original Report Authenticated By: Richarda Overlie, M.D.     Anti-infectives: Anti-infectives   Start     Dose/Rate Route Frequency Ordered Stop   09/05/12 0600  ertapenem (INVANZ) 1 g in sodium chloride 0.9 % 50 mL IVPB     1 g 100 mL/hr over 30 Minutes Intravenous On call to O.R. 09/04/12 1330 09/04/12 1450      Assessment/Plan: s/p Procedure(s) with comments: EXPLORATORY LAPAROTOMY (N/A) SMALL BOWEL RESECTION (N/A) - Anastimosis Await bowel function, will start PCA.  Reassured patient regarding progress.  Continue IV fluid.    LOS: 1 day    Dailah Opperman C 09/05/2012

## 2012-09-05 NOTE — Care Management Note (Signed)
    Page 1 of 1   09/11/2012     3:56:04 PM   CARE MANAGEMENT NOTE 09/11/2012  Patient:  Charles Golden, Charles Golden   Account Number:  [id number]  Date Initiated:  09/05/2012  Documentation initiated by:  Sharrie Rothman  Subjective/Objective Assessment:   Pt admitted from home with SBO. Pt s/p exp lap. Pt lives with his wife and is independent with ADL's. Pt will return home at discharge.     Action/Plan:   No CM needs noted.   Anticipated DC Date:  09/11/2012   Anticipated DC Plan:  HOME/SELF CARE      DC Planning Services  CM consult      Choice offered to / List presented to:             Status of service:  Completed, signed off Medicare Important Message given?  YES (If response is "NO", the following Medicare IM given date fields will be blank) Date Medicare IM given:  09/11/2012 Date Additional Medicare IM given:    Discharge Disposition:  HOME/SELF CARE  Per UR Regulation:    If discussed at Long Length of Stay Meetings, dates discussed:   09/09/2012  09/11/2012    Comments:  09/11/12 1555 Arlyss Queen, RN BSN CM Pt potential discharge today once MD rounded. No CM needs noted.  09/05/12 Arlyss Queen, RN BSN CM

## 2012-09-06 NOTE — Plan of Care (Signed)
Problem: Phase I Progression Outcomes Goal: OOB as tolerated unless otherwise ordered Outcome: Completed/Met Date Met:  09/06/12 Pt OOB to chair with assist today

## 2012-09-06 NOTE — Progress Notes (Signed)
Patient educated extensively on importance of deep breathing exercises, incentive spirometry, and mobility.  Patient has been very hesitant to move and get OOB.  With encouragement from staff and family, patient was able to get OOB to chair this morning.  PRN pain meds given before movement.  Patient reaching 1500 on incentive spirometer. Will continue to encourage increasing mobility and breathing exercises.  Patient refuses PCA due to having to wear the nasal cannula and O2 monitor  Tolerating ice chips well.

## 2012-09-06 NOTE — Progress Notes (Signed)
2 Days Post-Op  Subjective: Still complains mostly about pain.  No increase.  No nausea.  Asked again about eating.  Wanted to know about resumption of home medications.    Objective: Vital signs in last 24 hours: Temp:  [98.1 F (36.7 C)] 98.1 F (36.7 C) (04/12 0500) Pulse Rate:  [80-87] 80 (04/12 0500) Resp:  [20] 20 (04/11 2228) BP: (112-135)/(66-73) 135/73 mmHg (04/12 0500) Last BM Date: 09/04/12  Intake/Output from previous day: 04/11 0701 - 04/12 0700 In: -  Out: 1400 [Urine:1400] Intake/Output this shift:    General appearance: alert and no distress Resp: clear to auscultation bilaterally Cardio: regular rate and rhythm GI: Quiet, soft. Anticipated abdominal pain.  No peritoneal signs.  Incsion c/d/i.    Lab Results:   Recent Labs  09/04/12 0526 09/05/12 0526  WBC 8.4 11.8*  HGB 13.8 12.8*  HCT 42.1 39.3  PLT 231 234   BMET  Recent Labs  09/04/12 0526 09/05/12 0526  NA 142 138  K 3.9 5.0  CL 106 104  CO2 26 28  GLUCOSE 126* 123*  BUN 17 17  CREATININE 1.37* 1.37*  CALCIUM 9.0 8.3*   PT/INR  Recent Labs  09/04/12 0526  LABPROT 13.5  INR 1.04   ABG No results found for this basename: PHART, PCO2, PO2, HCO3,  in the last 72 hours  Studies/Results: No results found.  Anti-infectives: Anti-infectives   Start     Dose/Rate Route Frequency Ordered Stop   09/05/12 0600  ertapenem (INVANZ) 1 g in sodium chloride 0.9 % 50 mL IVPB     1 g 100 mL/hr over 30 Minutes Intravenous On call to O.R. 09/04/12 1330 09/04/12 1450      Assessment/Plan: s/p Procedure(s) with comments: EXPLORATORY LAPAROTOMY (N/A) SMALL BOWEL RESECTION (N/A) - Anastimosis Await bowel function. Again had a long discussion about recovery goals with patient. WIll increase activity.   Continue IV fluid.  Stop IV abx.  LOS: 2 days    Carlo Guevarra C 09/06/2012

## 2012-09-07 LAB — BASIC METABOLIC PANEL
BUN: 24 mg/dL — ABNORMAL HIGH (ref 6–23)
CO2: 27 mEq/L (ref 19–32)
Calcium: 8.5 mg/dL (ref 8.4–10.5)
Creatinine, Ser: 1.16 mg/dL (ref 0.50–1.35)
Glucose, Bld: 97 mg/dL (ref 70–99)

## 2012-09-07 LAB — CBC
MCH: 30.8 pg (ref 26.0–34.0)
MCV: 90.7 fL (ref 78.0–100.0)
Platelets: 195 10*3/uL (ref 150–400)
RBC: 3.21 MIL/uL — ABNORMAL LOW (ref 4.22–5.81)

## 2012-09-07 MED ORDER — FLUTICASONE PROPIONATE 50 MCG/ACT NA SUSP
1.0000 | Freq: Every day | NASAL | Status: DC
  Administered 2012-09-08 – 2012-09-12 (×5): 1 via NASAL
  Filled 2012-09-07: qty 16

## 2012-09-07 MED ORDER — AZELASTINE HCL 0.1 % NA SOLN
1.0000 | Freq: Two times a day (BID) | NASAL | Status: DC
  Administered 2012-09-07 – 2012-09-12 (×11): 1 via NASAL
  Filled 2012-09-07: qty 30

## 2012-09-07 MED ORDER — TIOTROPIUM BROMIDE MONOHYDRATE 18 MCG IN CAPS
18.0000 ug | ORAL_CAPSULE | Freq: Every day | RESPIRATORY_TRACT | Status: DC
  Filled 2012-09-07: qty 5

## 2012-09-07 MED ORDER — ACLIDINIUM BROMIDE 400 MCG/ACT IN AEPB: 1.0000 | INHALATION_SPRAY | Freq: Every day | RESPIRATORY_TRACT | Status: DC

## 2012-09-07 NOTE — Progress Notes (Signed)
Charles Golden is taking his Tudorza inhaler , He refuses the substitution of spiriva. From pharmacy . Their fore spirivia has been DC.

## 2012-09-07 NOTE — Progress Notes (Signed)
3 Days Post-Op  Subjective: Still complains of pain.  NO flatus.  No nausea.  No fever or chills.  "Hurts to cough".  Hurts to sit-up.  Objective: Vital signs in last 24 hours: Temp:  [97.8 F (36.6 C)-98.6 F (37 C)] 98.6 F (37 C) (04/13 1400) Pulse Rate:  [88-94] 92 (04/13 1400) Resp:  [20] 20 (04/13 1400) BP: (137-142)/(57-87) 138/84 mmHg (04/13 1400) SpO2:  [94 %-96 %] 96 % (04/13 1400) Last BM Date: 09/04/12  Intake/Output from previous day: 04/12 0701 - 04/13 0700 In: 1176 [I.V.:1176] Out: 800 [Urine:800] Intake/Output this shift:    General appearance: alert and no distress Resp: Course bilateral breath sounds Cardio: regular rate and rhythm GI: Quiet.  Moderate distention.  Expected tenderness.  Distractable.  Incision c/d/i.  Lab Results:   Recent Labs  09/05/12 0526 09/07/12 1325  WBC 11.8* 7.7  HGB 12.8* 9.9*  HCT 39.3 29.1*  PLT 234 195   BMET  Recent Labs  09/05/12 0526 09/07/12 1325  NA 138 136  K 5.0 3.6  CL 104 100  CO2 28 27  GLUCOSE 123* 97  BUN 17 24*  CREATININE 1.37* 1.16  CALCIUM 8.3* 8.5   PT/INR No results found for this basename: LABPROT, INR,  in the last 72 hours ABG No results found for this basename: PHART, PCO2, PO2, HCO3,  in the last 72 hours  Studies/Results: No results found.  Anti-infectives: Anti-infectives   Start     Dose/Rate Route Frequency Ordered Stop   09/05/12 0600  ertapenem (INVANZ) 1 g in sodium chloride 0.9 % 50 mL IVPB     1 g 100 mL/hr over 30 Minutes Intravenous On call to O.R. 09/04/12 1330 09/04/12 1450      Assessment/Plan: s/p Procedure(s) with comments: EXPLORATORY LAPAROTOMY (N/A) SMALL BOWEL RESECTION (N/A) - Anastimosis Await for bowel function.  Increase activity.  Continue IV fluid.  DVT prophylaxis.  LOS: 3 days    Amire Leazer C 09/07/2012

## 2012-09-07 NOTE — Progress Notes (Signed)
Pt ambulated in hallway approx 100 feet.  Tolerated well.

## 2012-09-07 NOTE — Progress Notes (Signed)
Have spoken with patient about importance of getting OOB and walking.  Pt's family at bedside.  Pt refuses to get out of bed at this time, however, does agree to getting OOB later this evening.  Will continue to encourage pt with incentive spirometry, TCDB, and ambulation.

## 2012-09-08 ENCOUNTER — Encounter (HOSPITAL_COMMUNITY): Payer: Self-pay | Admitting: General Surgery

## 2012-09-08 LAB — BASIC METABOLIC PANEL
Calcium: 8.5 mg/dL (ref 8.4–10.5)
Creatinine, Ser: 1.15 mg/dL (ref 0.50–1.35)
GFR calc Af Amer: 65 mL/min — ABNORMAL LOW (ref >90)
GFR calc non Af Amer: 56 mL/min — ABNORMAL LOW (ref >90)

## 2012-09-08 LAB — CBC
MCH: 30.4 pg (ref 26.0–34.0)
MCHC: 33.7 g/dL (ref 30.0–36.0)
MCV: 90.4 fL (ref 78.0–100.0)
Platelets: 213 10*3/uL (ref 150–400)
RDW: 13.4 % (ref 11.5–15.5)
WBC: 6.5 10*3/uL (ref 4.0–10.5)

## 2012-09-08 NOTE — Progress Notes (Signed)
4 Days Post-Op  Subjective: Still with "pain when I move".  Some flatus. NO BM.  NO nausea.  NO fever or chills.    Objective: Vital signs in last 24 hours: Temp:  [97.7 F (36.5 C)-98.7 F (37.1 C)] 98.7 F (37.1 C) (04/14 2140) Pulse Rate:  [59-64] 59 (04/14 2140) Resp:  [20] 20 (04/14 2140) BP: (107-127)/(57-69) 107/57 mmHg (04/14 2140) SpO2:  [93 %-97 %] 97 % (04/14 2140) Last BM Date: 09/04/12  Intake/Output from previous day: 04/13 0701 - 04/14 0700 In: -  Out: 700 [Urine:700] Intake/Output this shift:    General appearance: alert and no distress Resp: clear to auscultation bilaterally Cardio: regular rate and rhythm GI: Intermittent bowel sounds.  Soft, expected tenderness.  Incision clean dry intact.  NO peritoneal signs.    Lab Results:   Recent Labs  09/07/12 1325 09/08/12 0527  WBC 7.7 6.5  HGB 9.9* 9.5*  HCT 29.1* 28.2*  PLT 195 213   BMET  Recent Labs  09/07/12 1325 09/08/12 0527  NA 136 138  K 3.6 3.5  CL 100 104  CO2 27 26  GLUCOSE 97 96  BUN 24* 25*  CREATININE 1.16 1.15  CALCIUM 8.5 8.5   PT/INR No results found for this basename: LABPROT, INR,  in the last 72 hours ABG No results found for this basename: PHART, PCO2, PO2, HCO3,  in the last 72 hours  Studies/Results: No results found.  Anti-infectives: Anti-infectives   Start     Dose/Rate Route Frequency Ordered Stop   09/05/12 0600  ertapenem (INVANZ) 1 g in sodium chloride 0.9 % 50 mL IVPB     1 g 100 mL/hr over 30 Minutes Intravenous On call to O.R. 09/04/12 1330 09/04/12 1450      Assessment/Plan: s/p Procedure(s) with comments: EXPLORATORY LAPAROTOMY (N/A) SMALL BOWEL RESECTION (N/A) - Anastimosis Reassured patient.  Will start clears.  Continue to increase activity.  If tolerates advancement to fulls will plan to start home meds likely in the next 24 hours.    LOS: 4 days    Holton Sidman C 09/08/2012

## 2012-09-09 LAB — BASIC METABOLIC PANEL
CO2: 27 mEq/L (ref 19–32)
Calcium: 8.2 mg/dL — ABNORMAL LOW (ref 8.4–10.5)
Creatinine, Ser: 1.19 mg/dL (ref 0.50–1.35)
Glucose, Bld: 101 mg/dL — ABNORMAL HIGH (ref 70–99)

## 2012-09-09 LAB — CBC
Hemoglobin: 8.7 g/dL — ABNORMAL LOW (ref 13.0–17.0)
MCH: 30.4 pg (ref 26.0–34.0)
MCV: 90.2 fL (ref 78.0–100.0)
RBC: 2.86 MIL/uL — ABNORMAL LOW (ref 4.22–5.81)

## 2012-09-09 NOTE — Progress Notes (Signed)
UR Chart Review Completed  

## 2012-09-10 LAB — CBC
MCH: 30.4 pg (ref 26.0–34.0)
MCHC: 33.9 g/dL (ref 30.0–36.0)
MCV: 89.5 fL (ref 78.0–100.0)
Platelets: 230 10*3/uL (ref 150–400)
RDW: 13.3 % (ref 11.5–15.5)

## 2012-09-10 LAB — BASIC METABOLIC PANEL
Calcium: 8.3 mg/dL — ABNORMAL LOW (ref 8.4–10.5)
Creatinine, Ser: 1.15 mg/dL (ref 0.50–1.35)
GFR calc Af Amer: 65 mL/min — ABNORMAL LOW (ref >90)

## 2012-09-10 MED ORDER — PANTOPRAZOLE SODIUM 40 MG PO TBEC
40.0000 mg | DELAYED_RELEASE_TABLET | Freq: Every day | ORAL | Status: DC
  Administered 2012-09-10 – 2012-09-11 (×2): 40 mg via ORAL
  Filled 2012-09-10 (×2): qty 1

## 2012-09-10 MED ORDER — HYDROCODONE-ACETAMINOPHEN 5-325 MG PO TABS
1.0000 | ORAL_TABLET | ORAL | Status: DC | PRN
  Administered 2012-09-10 – 2012-09-11 (×4): 1 via ORAL
  Administered 2012-09-12: 2 via ORAL
  Filled 2012-09-10 (×2): qty 1
  Filled 2012-09-10: qty 2
  Filled 2012-09-10 (×2): qty 1

## 2012-09-10 MED ORDER — DOXAZOSIN MESYLATE 2 MG PO TABS
4.0000 mg | ORAL_TABLET | Freq: Every day | ORAL | Status: DC
  Administered 2012-09-10 – 2012-09-11 (×2): 4 mg via ORAL
  Filled 2012-09-10 (×2): qty 2

## 2012-09-10 MED ORDER — CELECOXIB 100 MG PO CAPS
200.0000 mg | ORAL_CAPSULE | Freq: Two times a day (BID) | ORAL | Status: DC
  Administered 2012-09-10 – 2012-09-12 (×5): 200 mg via ORAL
  Filled 2012-09-10 (×5): qty 2

## 2012-09-10 MED ORDER — BISACODYL 10 MG RE SUPP
10.0000 mg | Freq: Once | RECTAL | Status: AC
  Administered 2012-09-10: 10 mg via RECTAL
  Filled 2012-09-10: qty 1

## 2012-09-10 NOTE — Progress Notes (Signed)
Pt requesting abdominal binder for abdominal incision.  Pt also states that he has been having urinary frequency.  States that he takes Cardura 4 mg daily from Dr. Annabell Howells at home.  Dr. Leticia Penna paged.  Dr. Leticia Penna returned page and gave orders for patient to have abdominal binder and for patient to have Cardura 4 mg po daily.  Orders followed.

## 2012-09-10 NOTE — Progress Notes (Signed)
The patient is receiving Protonix by the intravenous route.  Based on criteria approved by the Pharmacy and Therapeutics Committee and the Medical Executive Committee, the medication is being converted to the equivalent oral dose form.  These criteria include: -No Active GI bleeding -Able to tolerate diet of full liquids (or better) or tube feeding OR able to tolerate other medications by the oral or enteral route  If you have any questions about this conversion, please contact the Pharmacy Department (ext [phone]).  Thank you.  Charles Golden, [facility] 09/10/2012 3:05 PM

## 2012-09-10 NOTE — Progress Notes (Signed)
Pt had a bowel movement this evening.  His first since surgery on 4/11.  Pt reported that it was very dark.  Dr. Lovell Sheehan notified via text page.  Reported to oncoming nurse.

## 2012-09-10 NOTE — Progress Notes (Signed)
5 days postop  Subjective: Patient still complains of pain around his incision with movement. Otherwise no complaints. Still with limited flatus. No nausea with liquids. No bowel movement. Patient is ambulating.  Objective: Vital signs in last 24 hours: Temp:  [97.4 F (36.3 C)-97.9 F (36.6 C)] 97.9 F (36.6 C) (04/16 0500) Pulse Rate:  [63-75] 63 (04/16 0500) Resp:  [16-20] 16 (04/16 0500) BP: (127-147)/(70-76) 137/76 mmHg (04/16 0500) SpO2:  [94 %-95 %] 94 % (04/16 0500) Last BM Date: 09/04/12  Intake/Output from previous day: 04/15 0701 - 04/16 0700 In: 1080 [P.O.:1080] Out: 600 [Urine:600] Intake/Output this shift:    General appearance: alert and no distress GI: Intermittent bowel sounds, soft, expected postoperative tenderness. Incision is clean dry and intact. No hernia. No peritoneal signs.  Lab Results:   Recent Labs  09/09/12 0540 09/10/12 0537  WBC 5.5 6.1  HGB 8.7* 9.3*  HCT 25.8* 27.4*  PLT 209 230   BMET  Recent Labs  09/09/12 0540 09/10/12 0537  NA 140 138  K 3.1* 3.4*  CL 105 103  CO2 27 28  GLUCOSE 101* 103*  BUN 25* 20  CREATININE 1.19 1.15  CALCIUM 8.2* 8.3*   PT/INR No results found for this basename: LABPROT, INR,  in the last 72 hours ABG No results found for this basename: PHART, PCO2, PO2, HCO3,  in the last 72 hours  Studies/Results: No results found.  Anti-infectives: Anti-infectives   Start     Dose/Rate Route Frequency Ordered Stop   09/05/12 0600  ertapenem (INVANZ) 1 g in sodium chloride 0.9 % 50 mL IVPB     1 g 100 mL/hr over 30 Minutes Intravenous On call to O.R. 09/04/12 1330 09/04/12 1450      Assessment/Plan: s/p Procedure(s) with comments: EXPLORATORY LAPAROTOMY (N/A) SMALL BOWEL RESECTION (N/A) - Anastimosis Again reassured patient regarding his symptoms. Symptoms are clearly related to discomfort from his incision. I continued to encourage him to an ileus tolerated. We'll continue on full liquids for now  until bowel function increases. Patient does state he noted some rumbling so hopefully we'll have increased bowel function in the next 24-48 hours. We'll attempt rectal stimulation with the doculsate suppository. Length of stay 5 days   Hollis Oh C 09/10/2012

## 2012-09-11 LAB — CBC
MCV: 89.5 fL (ref 78.0–100.0)
Platelets: 246 10*3/uL (ref 150–400)
RDW: 13.3 % (ref 11.5–15.5)
WBC: 5.4 10*3/uL (ref 4.0–10.5)

## 2012-09-11 LAB — BASIC METABOLIC PANEL
Chloride: 105 mEq/L (ref 96–112)
Creatinine, Ser: 1.24 mg/dL (ref 0.50–1.35)
GFR calc Af Amer: 59 mL/min — ABNORMAL LOW (ref >90)

## 2012-09-11 MED ORDER — HYDROMORPHONE HCL PF 1 MG/ML IJ SOLN: 1.0000 mg | INTRAMUSCULAR | Status: DC | PRN

## 2012-09-11 NOTE — Plan of Care (Signed)
Problem: Phase II Progression Outcomes Goal: Progress activity as tolerated unless otherwise ordered Outcome: Completed/Met Date Met:  09/11/12 Pt ambulating in hallway.

## 2012-09-11 NOTE — Progress Notes (Signed)
Pt ambulated four times in hallway today utilizing front wheel walker.  Pt tolerated well.

## 2012-09-11 NOTE — Plan of Care (Signed)
Problem: Phase II Progression Outcomes Goal: Tolerating diet Outcome: Completed/Met Date Met:  09/11/12 Pt on Dys 3 diet.  Tolerating well.

## 2012-09-11 NOTE — Progress Notes (Signed)
7 Days Post-Op  Subjective: Comfortable.  Minimal pain medications.  +BM x2  Objective: Vital signs in last 24 hours: Temp:  [97.8 F (36.6 C)-98.6 F (37 C)] 98 F (36.7 C) (04/17 1514) Pulse Rate:  [69-70] 69 (04/17 1514) Resp:  [16] 16 (04/17 1514) BP: (127-131)/(64-76) 129/64 mmHg (04/17 1514) SpO2:  [92 %-97 %] 97 % (04/17 1514) Last BM Date: 09/10/12  Intake/Output from previous day: 04/16 0701 - 04/17 0700 In: 720 [P.O.:720] Out: -  Intake/Output this shift: Total I/O In: 480 [P.O.:480] Out: -   General appearance: alert and no distress GI: +BS, soft, expected tenderness  Lab Results:   Recent Labs  09/10/12 0537 09/11/12 0524  WBC 6.1 5.4  HGB 9.3* 9.4*  HCT 27.4* 27.2*  PLT 230 246   BMET  Recent Labs  09/10/12 0537 09/11/12 0524  NA 138 139  K 3.4* 3.5  CL 103 105  CO2 28 28  GLUCOSE 103* 101*  BUN 20 15  CREATININE 1.15 1.24  CALCIUM 8.3* 8.3*   PT/INR No results found for this basename: LABPROT, INR,  in the last 72 hours ABG No results found for this basename: PHART, PCO2, PO2, HCO3,  in the last 72 hours  Studies/Results: No results found.  Anti-infectives: Anti-infectives   Start     Dose/Rate Route Frequency Ordered Stop   09/05/12 0600  ertapenem (INVANZ) 1 g in sodium chloride 0.9 % 50 mL IVPB     1 g 100 mL/hr over 30 Minutes Intravenous On call to O.R. 09/04/12 1330 09/04/12 1450      Assessment/Plan: s/p Procedure(s) with comments: EXPLORATORY LAPAROTOMY (N/A) SMALL BOWEL RESECTION (N/A) - Anastimosis Advance diet.  Possible d/c in 24 hours if continues to improve  LOS: 7 days    Rorey Bisson C 09/11/2012

## 2012-09-11 NOTE — Progress Notes (Signed)
7 days POD  Subjective: Pain about the same.  Some flatus.  No BM.  No fever or chills  Objective: Vital signs in last 24 hours: Temp:  [97.8 F (36.6 C)-98.6 F (37 C)] 97.8 F (36.6 C) (04/17 0559) Pulse Rate:  [69-70] 69 (04/17 0559) Resp:  [16] 16 (04/17 0559) BP: (127-131)/(72-76) 131/72 mmHg (04/17 0559) SpO2:  [92 %-94 %] 94 % (04/17 0559) Last BM Date: 09/10/12  Intake/Output from previous day: 04/16 0701 - 04/17 0700 In: 720 [P.O.:720] Out: -  Intake/Output this shift:    General appearance: alert and no distress GI: Intermittent bowel sounds.  Expected tenderness.  Soft.  Incision c/d/i.  + staples.  Lab Results:   Recent Labs  09/10/12 0537 09/11/12 0524  WBC 6.1 5.4  HGB 9.3* 9.4*  HCT 27.4* 27.2*  PLT 230 246   BMET  Recent Labs  09/10/12 0537 09/11/12 0524  NA 138 139  K 3.4* 3.5  CL 103 105  CO2 28 28  GLUCOSE 103* 101*  BUN 20 15  CREATININE 1.15 1.24  CALCIUM 8.3* 8.3*   PT/INR No results found for this basename: LABPROT, INR,  in the last 72 hours ABG No results found for this basename: PHART, PCO2, PO2, HCO3,  in the last 72 hours  Studies/Results: No results found.  Anti-infectives: Anti-infectives   Start     Dose/Rate Route Frequency Ordered Stop   09/05/12 0600  ertapenem (INVANZ) 1 g in sodium chloride 0.9 % 50 mL IVPB     1 g 100 mL/hr over 30 Minutes Intravenous On call to O.R. 09/04/12 1330 09/04/12 1450      Assessment/Plan: s/p Procedure(s) with comments: EXPLORATORY LAPAROTOMY (N/A) SMALL BOWEL RESECTION (N/A) - Anastimosis Await bowel function.  Continue activity.   LOS 6 days   Hessie Varone C 09/11/2012

## 2012-09-12 ENCOUNTER — Emergency Department (HOSPITAL_COMMUNITY)
Admission: EM | Admit: 2012-09-12 | Discharge: 2012-09-13 | Disposition: A | Discharge status: Home / Self Care | Payer: Medicare Other | Attending: Emergency Medicine | Admitting: Emergency Medicine

## 2012-09-12 ENCOUNTER — Encounter (HOSPITAL_COMMUNITY): Payer: Self-pay | Admitting: *Deleted

## 2012-09-12 DIAGNOSIS — Z87448 Personal history of other diseases of urinary system: Secondary | ICD-10-CM | POA: Insufficient documentation

## 2012-09-12 DIAGNOSIS — Z87891 Personal history of nicotine dependence: Secondary | ICD-10-CM | POA: Insufficient documentation

## 2012-09-12 DIAGNOSIS — R141 Gas pain: Secondary | ICD-10-CM | POA: Diagnosis not present

## 2012-09-12 DIAGNOSIS — Z8739 Personal history of other diseases of the musculoskeletal system and connective tissue: Secondary | ICD-10-CM | POA: Insufficient documentation

## 2012-09-12 DIAGNOSIS — J449 Chronic obstructive pulmonary disease, unspecified: Secondary | ICD-10-CM | POA: Insufficient documentation

## 2012-09-12 DIAGNOSIS — Z8719 Personal history of other diseases of the digestive system: Secondary | ICD-10-CM | POA: Insufficient documentation

## 2012-09-12 DIAGNOSIS — Z8709 Personal history of other diseases of the respiratory system: Secondary | ICD-10-CM | POA: Insufficient documentation

## 2012-09-12 DIAGNOSIS — J9819 Other pulmonary collapse: Secondary | ICD-10-CM | POA: Diagnosis not present

## 2012-09-12 DIAGNOSIS — R109 Unspecified abdominal pain: Secondary | ICD-10-CM | POA: Diagnosis not present

## 2012-09-12 DIAGNOSIS — J4489 Other specified chronic obstructive pulmonary disease: Secondary | ICD-10-CM | POA: Insufficient documentation

## 2012-09-12 DIAGNOSIS — Z9049 Acquired absence of other specified parts of digestive tract: Secondary | ICD-10-CM | POA: Insufficient documentation

## 2012-09-12 DIAGNOSIS — IMO0002 Reserved for concepts with insufficient information to code with codable children: Secondary | ICD-10-CM | POA: Diagnosis not present

## 2012-09-12 DIAGNOSIS — Z7982 Long term (current) use of aspirin: Secondary | ICD-10-CM | POA: Insufficient documentation

## 2012-09-12 DIAGNOSIS — Z79899 Other long term (current) drug therapy: Secondary | ICD-10-CM | POA: Insufficient documentation

## 2012-09-12 LAB — BASIC METABOLIC PANEL
BUN: 14 mg/dL (ref 6–23)
Chloride: 108 mEq/L (ref 96–112)
GFR calc Af Amer: 57 mL/min — ABNORMAL LOW (ref >90)
Potassium: 3.8 mEq/L (ref 3.5–5.1)

## 2012-09-12 LAB — CBC
HCT: 28.8 % — ABNORMAL LOW (ref 39.0–52.0)
Platelets: 279 10*3/uL (ref 150–400)
RDW: 13.5 % (ref 11.5–15.5)
WBC: 6 10*3/uL (ref 4.0–10.5)

## 2012-09-12 MED ORDER — ONDANSETRON HCL 4 MG/2ML IJ SOLN
4.0000 mg | Freq: Once | INTRAMUSCULAR | Status: AC
  Administered 2012-09-13: 4 mg via INTRAVENOUS
  Filled 2012-09-12: qty 2

## 2012-09-12 MED ORDER — HYDROMORPHONE HCL PF 1 MG/ML IJ SOLN
1.0000 mg | Freq: Once | INTRAMUSCULAR | Status: AC
  Administered 2012-09-13: 1 mg via INTRAVENOUS
  Filled 2012-09-12: qty 1

## 2012-09-12 MED ORDER — SODIUM CHLORIDE 0.9 % IV SOLN
Freq: Once | INTRAVENOUS | Status: AC
  Administered 2012-09-13: via INTRAVENOUS

## 2012-09-12 MED ORDER — HYDROCODONE-ACETAMINOPHEN 5-325 MG PO TABS: 1.0000 | ORAL_TABLET | ORAL | Status: DC | PRN

## 2012-09-12 NOTE — ED Notes (Signed)
Patient cannot remember the last time he voided but did hAVE BM

## 2012-09-12 NOTE — ED Notes (Addendum)
D/c from [facility]  Today, Had abd surgery, staples in place.    No N/V,  Took miralax pta.Moaning with pain. Pt had portion of sm instestine removed. Due to "twisting"

## 2012-09-12 NOTE — Progress Notes (Signed)
Patient received discharge instructions along with follow up appointments and prescriptions. Patient verbalized understanding of all instructions. Patient was escorted by staff via wheelchair to vehicle. Patient discharged to home in stable condition. 

## 2012-09-12 NOTE — ED Notes (Signed)
Done a bladder scan only 100 ml showing in bladder.

## 2012-09-13 ENCOUNTER — Emergency Department (HOSPITAL_COMMUNITY): Payer: Medicare Other

## 2012-09-13 ENCOUNTER — Encounter (HOSPITAL_COMMUNITY): Payer: Self-pay | Admitting: Emergency Medicine

## 2012-09-13 ENCOUNTER — Emergency Department (HOSPITAL_COMMUNITY)
Admission: EM | Admit: 2012-09-13 | Discharge: 2012-09-13 | Disposition: A | Discharge status: Home / Self Care | Payer: Medicare Other | Attending: Emergency Medicine | Admitting: Emergency Medicine

## 2012-09-13 DIAGNOSIS — Z8739 Personal history of other diseases of the musculoskeletal system and connective tissue: Secondary | ICD-10-CM | POA: Insufficient documentation

## 2012-09-13 DIAGNOSIS — Z87448 Personal history of other diseases of urinary system: Secondary | ICD-10-CM | POA: Insufficient documentation

## 2012-09-13 DIAGNOSIS — Z7982 Long term (current) use of aspirin: Secondary | ICD-10-CM | POA: Diagnosis not present

## 2012-09-13 DIAGNOSIS — Z87891 Personal history of nicotine dependence: Secondary | ICD-10-CM | POA: Insufficient documentation

## 2012-09-13 DIAGNOSIS — J9819 Other pulmonary collapse: Secondary | ICD-10-CM | POA: Diagnosis not present

## 2012-09-13 DIAGNOSIS — IMO0002 Reserved for concepts with insufficient information to code with codable children: Secondary | ICD-10-CM | POA: Diagnosis not present

## 2012-09-13 DIAGNOSIS — Z8719 Personal history of other diseases of the digestive system: Secondary | ICD-10-CM | POA: Insufficient documentation

## 2012-09-13 DIAGNOSIS — J449 Chronic obstructive pulmonary disease, unspecified: Secondary | ICD-10-CM | POA: Insufficient documentation

## 2012-09-13 DIAGNOSIS — R141 Gas pain: Secondary | ICD-10-CM | POA: Diagnosis not present

## 2012-09-13 DIAGNOSIS — Y838 Other surgical procedures as the cause of abnormal reaction of the patient, or of later complication, without mention of misadventure at the time of the procedure: Secondary | ICD-10-CM | POA: Insufficient documentation

## 2012-09-13 DIAGNOSIS — J4489 Other specified chronic obstructive pulmonary disease: Secondary | ICD-10-CM | POA: Insufficient documentation

## 2012-09-13 DIAGNOSIS — T148XXA Other injury of unspecified body region, initial encounter: Secondary | ICD-10-CM

## 2012-09-13 DIAGNOSIS — Z79899 Other long term (current) drug therapy: Secondary | ICD-10-CM | POA: Diagnosis not present

## 2012-09-13 LAB — COMPREHENSIVE METABOLIC PANEL
CO2: 26 mEq/L (ref 19–32)
Calcium: 8.5 mg/dL (ref 8.4–10.5)
Creatinine, Ser: 1.33 mg/dL (ref 0.50–1.35)
GFR calc Af Amer: 55 mL/min — ABNORMAL LOW (ref >90)
GFR calc non Af Amer: 47 mL/min — ABNORMAL LOW (ref >90)
Glucose, Bld: 132 mg/dL — ABNORMAL HIGH (ref 70–99)

## 2012-09-13 LAB — CBC WITH DIFFERENTIAL/PLATELET
Eosinophils Relative: 2 % (ref 0–5)
HCT: 32.7 % — ABNORMAL LOW (ref 39.0–52.0)
Lymphocytes Relative: 13 % (ref 12–46)
Lymphs Abs: 0.9 10*3/uL (ref 0.7–4.0)
MCH: 30.7 pg (ref 26.0–34.0)
MCV: 90.3 fL (ref 78.0–100.0)
Monocytes Absolute: 0.2 10*3/uL (ref 0.1–1.0)
RBC: 3.62 MIL/uL — ABNORMAL LOW (ref 4.22–5.81)
RDW: 13.6 % (ref 11.5–15.5)
WBC: 6.8 10*3/uL (ref 4.0–10.5)

## 2012-09-13 MED ORDER — HYDROMORPHONE HCL PF 1 MG/ML IJ SOLN
1.0000 mg | Freq: Once | INTRAMUSCULAR | Status: AC
  Administered 2012-09-13: 1 mg via INTRAVENOUS
  Filled 2012-09-13: qty 1

## 2012-09-13 MED ORDER — ONDANSETRON 4 MG PO TBDP: 4.0000 mg | ORAL_TABLET | Freq: Three times a day (TID) | ORAL | Status: DC | PRN

## 2012-09-13 MED ORDER — ONDANSETRON HCL 4 MG/2ML IJ SOLN
4.0000 mg | Freq: Once | INTRAMUSCULAR | Status: AC
  Administered 2012-09-13: 4 mg via INTRAVENOUS
  Filled 2012-09-13: qty 2

## 2012-09-13 NOTE — ED Notes (Signed)
Gave patient a glass of water as per Dr. Colon Branch.

## 2012-09-13 NOTE — ED Notes (Signed)
Patient complaining of serosanguinous drainage from abdominal incision. Small amount of drainage on incision. Reports was seen last night for pain, no drainage at that time.

## 2012-09-13 NOTE — ED Provider Notes (Signed)
History     CSN: [id number]  Arrival date & time 09/12/12  2259   First MD Initiated Contact with Patient 09/12/12 2312      Chief Complaint  Patient presents with  . Abdominal Pain    (Consider location/radiation/quality/duration/timing/severity/associated sxs/prior treatment) HPI Charles Golden is a 79 y.o. male with a recent h/o surgery for small bowel resection  who presents to the Emergency Department complaining of acute abdominal pain in the lower abdomen that extends into the groin. Pain began suddenly and is severe. He has taken his pain medicine only with no effect.  PCP Dr. Christell Constant Surgeon Dr. Leticia Penna Past Medical History  Diagnosis Date  . IBS (irritable bowel syndrome)   . Enlarged prostate   . Sinus congestion   . Sciatic pain   . COPD (chronic obstructive pulmonary disease)   . Glucagonoma     Past Surgical History  Procedure Laterality Date  . Hernia repair    . Hemorrhoid surgery    . Laparotomy N/A 09/04/2012    Procedure: EXPLORATORY LAPAROTOMY;  Surgeon: Fabio Bering, MD;  Location: [facility];  Service: General;  Laterality: N/A;  . Bowel resection N/A 09/04/2012    Procedure: SMALL BOWEL RESECTION;  Surgeon: Fabio Bering, MD;  Location: [facility];  Service: General;  Laterality: N/A;  Anastimosis    History reviewed. No pertinent family history.  History  Substance Use Topics  . Smoking status: Former Games developer  . Smokeless tobacco: Not on file  . Alcohol Use: No      Review of Systems  Constitutional: Negative for fever.       10 Systems reviewed and are negative for acute change except as noted in the HPI.  HENT: Negative for congestion.   Eyes: Negative for discharge and redness.  Respiratory: Negative for cough and shortness of breath.   Cardiovascular: Negative for chest pain.  Gastrointestinal: Positive for abdominal pain. Negative for vomiting.  Musculoskeletal: Negative for back pain.  Skin: Negative for rash.  Neurological: Negative  for syncope, numbness and headaches.  Psychiatric/Behavioral:       No behavior change.    Allergies  Meperidine hcl  Home Medications   Current Outpatient Rx  Name  Route  Sig  Dispense  Refill  . aspirin 81 MG tablet   Oral   Take 81 mg by mouth daily.           Marland Kitchen azelastine (ASTELIN) 137 MCG/SPRAY nasal spray   Nasal   Place 1 spray into the nose 1 day or 1 dose. Use in each nostril as directed   30 mL   12   . doxazosin (CARDURA) 4 MG tablet   Oral   Take 4 mg by mouth at bedtime.           . fluticasone (FLONASE) 50 MCG/ACT nasal spray   Nasal   Place 1 spray into the nose daily.         Marland Kitchen HYDROcodone-acetaminophen (NORCO/VICODIN) 5-325 MG per tablet   Oral   Take 1-2 tablets by mouth every 4 (four) hours as needed.   45 tablet   0   . latanoprost (XALATAN) 0.005 % ophthalmic solution   Left Eye   Place 1 drop into the left eye daily.         . ondansetron (ZOFRAN ODT) 4 MG disintegrating tablet   Oral   Take 1 tablet (4 mg total) by mouth every 8 (eight) hours as needed for nausea.  20 tablet   0   . TUDORZA PRESSAIR 400 MCG/ACT AEPB      1 puff daily.           BP 124/57  Pulse 93  Temp(Src) 98.1 F (36.7 C) (Oral)  Resp 18  Wt 170 lb (77.111 kg)  BMI 24.39 kg/m2  SpO2 97%  Physical Exam  Nursing note and vitals reviewed. Constitutional: He appears well-developed and well-nourished.  Awake, alert, nontoxic appearance.  HENT:  Head: Normocephalic and atraumatic.  Right Ear: External ear normal.  Left Ear: External ear normal.  Eyes: EOM are normal. Pupils are equal, round, and reactive to light.  Neck: Neck supple.  Pulmonary/Chest: Effort normal. He exhibits no tenderness.  Abdominal: Soft. There is tenderness. There is no rebound and no guarding.  Stapled incision line without signs of infection. Lower abdominal pain.  Musculoskeletal: He exhibits no tenderness.  Baseline ROM, no obvious new focal weakness.  Neurological:   Mental status and motor strength appears baseline for patient and situation.  Skin: No rash noted.  Psychiatric: He has a normal mood and affect.    ED Course  Procedures (including critical care time) Results for orders placed during the hospital encounter of 09/12/12  CBC WITH DIFFERENTIAL      Result Value Range   WBC 6.8  4.0 - 10.5 K/uL   RBC 3.62 (*) 4.22 - 5.81 MIL/uL   Hemoglobin 11.1 (*) 13.0 - 17.0 g/dL   HCT 16.1 (*) 09.6 - 04.5 %   MCV 90.3  78.0 - 100.0 fL   MCH 30.7  26.0 - 34.0 pg   MCHC 33.9  30.0 - 36.0 g/dL   RDW 40.9  81.1 - 91.4 %   Platelets 334  150 - 400 K/uL   Neutrophils Relative 83 (*) 43 - 77 %   Neutro Abs 5.6  1.7 - 7.7 K/uL   Lymphocytes Relative 13  12 - 46 %   Lymphs Abs 0.9  0.7 - 4.0 K/uL   Monocytes Relative 3  3 - 12 %   Monocytes Absolute 0.2  0.1 - 1.0 K/uL   Eosinophils Relative 2  0 - 5 %   Eosinophils Absolute 0.1  0.0 - 0.7 K/uL   Basophils Relative 0  0 - 1 %   Basophils Absolute 0.0  0.0 - 0.1 K/uL  COMPREHENSIVE METABOLIC PANEL      Result Value Range   Sodium 137  135 - 145 mEq/L   Potassium 3.6  3.5 - 5.1 mEq/L   Chloride 101  96 - 112 mEq/L   CO2 26  19 - 32 mEq/L   Glucose, Bld 132 (*) 70 - 99 mg/dL   BUN 17  6 - 23 mg/dL   Creatinine, Ser 7.82  0.50 - 1.35 mg/dL   Calcium 8.5  8.4 - 95.6 mg/dL   Total Protein 5.9 (*) 6.0 - 8.3 g/dL   Albumin 2.7 (*) 3.5 - 5.2 g/dL   AST 19  0 - 37 U/L   ALT 12  0 - 53 U/L   Alkaline Phosphatase 67  39 - 117 U/L   Total Bilirubin 0.2 (*) 0.3 - 1.2 mg/dL   GFR calc non Af Amer 47 (*) >90 mL/min   GFR calc Af Amer 55 (*) >90 mL/min    Dg Abd Acute W/chest  09/13/2012  *RADIOLOGY REPORT*  Clinical Data: Lower abdominal pain today, nausea, recent small bowel resection  ACUTE ABDOMEN SERIES (ABDOMEN 2 VIEW &  CHEST 1 VIEW)  Comparison: Abdominal and pelvic CT04/02/2013  Findings: Normal heart size, mediastinal contours, and pulmonary vascularity. Minimal left basilar atelectasis. Right  infrahilar density could represent atelectasis or infiltrate. Atherosclerotic calcification aorta. No pleural effusion or pneumothorax.  Small amount of stool in colon. Gaseous distention of transverse and proximal descending colon. Few minimally prominent small bowel loops are identified. Findings could represent postoperative ileus. Numerous pelvic phleboliths. No definite obstruction, free intraperitoneal air or bowel wall thickening identified. Diffuse osseous demineralization.  IMPRESSION: Slight prominent loops of small bowel and left mid abdomen as well as gaseous distention of the transverse and proximal descending colon raising question of ileus. Minimal left basilar atelectasis with mild right infrahilar atelectasis versus infiltrate.   Original Report Authenticated By: Ulyses Southward, M.D.    Medications  0.9 %  sodium chloride infusion ( Intravenous Stopped 09/13/12 0239)  HYDROmorphone (DILAUDID) injection 1 mg (1 mg Intravenous Given 09/13/12 0006)  ondansetron (ZOFRAN) injection 4 mg (4 mg Intravenous Given 09/13/12 0006)  HYDROmorphone (DILAUDID) injection 1 mg (1 mg Intravenous Given 09/13/12 0030)  HYDROmorphone (DILAUDID) injection 1 mg (1 mg Intravenous Given 09/13/12 0154)  ondansetron (ZOFRAN) injection 4 mg (4 mg Intravenous Given 09/13/12 0154)    1. Abdominal pain    0010 Patient given hydromorphone and zofran. Labs drawn and he was sent to xray for acute abdomen.  0030 Repeat of hydromorphone for pain. His pain has not been relieved. 1610 Reviewed xray with pateint. Gave him a paper copy of the xray.  0145 Given additional hydromorphone and zofran.   MDM  Patient s/p exploratory lap for small bowel obstruction here with severe abdominal pain. Labs are normal. Acute abdominal series does not show an acute process. Patient advised of findings. He became more comfortable after passing some flatus and receiving the third dose of medicine. Pt stable in ED with no significant  deterioration in condition.The patient appears reasonably screened and/or stabilized for discharge and I doubt any other medical condition or other [facility] requiring further screening, evaluation, or treatment in the ED at this time prior to discharge.  MDM Reviewed: nursing note and vitals Interpretation: labs and x-ray           Nicoletta Dress. Colon Branch, MD 09/13/12 [id number]

## 2012-09-13 NOTE — ED Notes (Signed)
Pt reports drainage from mid-abdominal surgical incision site which he first noticed at 7pm this evening. Pt had the surgery 10 days ago. Pt was seen here yesterday for "excrutiating pain" and was told it was gas pain. Pt returns tonight due to drainage from area but reports pain is better.

## 2012-09-13 NOTE — ED Provider Notes (Signed)
History  This chart was scribed for [employer]. Colon Branch, MD by Erskine Emery, ED Scribe. This patient was seen in room [facility] and the patient's care was started at 23:02.   CSN: [id number]  Arrival date & time 09/13/12  2148   First MD Initiated Contact with Patient 09/13/12 2302      Chief Complaint  Patient presents with  . Drainage from Incision    (Consider location/radiation/quality/duration/timing/severity/associated sxs/prior treatment) The history is provided by the patient. No language interpreter was used.  Charles Golden is a 79 y.o. male with a h/o irritable bowel syndrome who presents to the Emergency Department complaining of serosanguinous drainage from his abdominal surgical site since he woke up from his nap this evening. Pt was seen here last night for abdominal pain, related to gas. He reports he feels much improved from then. Pt had bowel resection surgery 9 days ago by Dr. Leticia Penna for a bowel obstruction.  PCP Dr. Christell Constant Surgeon  Dr. Leticia Penna  Past Medical History  Diagnosis Date  . IBS (irritable bowel syndrome)   . Enlarged prostate   . Sinus congestion   . Sciatic pain   . COPD (chronic obstructive pulmonary disease)   . Glucagonoma     Past Surgical History  Procedure Laterality Date  . Hernia repair    . Hemorrhoid surgery    . Laparotomy N/A 09/04/2012    Procedure: EXPLORATORY LAPAROTOMY;  Surgeon: Fabio Bering, MD;  Location: [facility];  Service: General;  Laterality: N/A;  . Bowel resection N/A 09/04/2012    Procedure: SMALL BOWEL RESECTION;  Surgeon: Fabio Bering, MD;  Location: [facility];  Service: General;  Laterality: N/A;  Anastimosis    History reviewed. No pertinent family history.  History  Substance Use Topics  . Smoking status: Former Games developer  . Smokeless tobacco: Not on file  . Alcohol Use: No      Review of Systems  Constitutional: Negative for fever.       10 Systems reviewed and are negative for acute change except as noted  in the HPI.  HENT: Negative for congestion.   Eyes: Negative for discharge and redness.  Respiratory: Negative for cough and shortness of breath.   Cardiovascular: Negative for chest pain.  Gastrointestinal: Negative for vomiting and abdominal pain.       Drainage from wound  Musculoskeletal: Negative for back pain.  Skin: Negative for rash.  Neurological: Negative for syncope, numbness and headaches.  Psychiatric/Behavioral:       No behavior change.   A complete 10 system review of systems was obtained and all systems are negative except as noted in the HPI and PMH.    Allergies  Meperidine hcl  Home Medications   Current Outpatient Rx  Name  Route  Sig  Dispense  Refill  . aspirin 81 MG tablet   Oral   Take 81 mg by mouth daily.           Marland Kitchen azelastine (ASTELIN) 137 MCG/SPRAY nasal spray   Nasal   Place 1 spray into the nose 1 day or 1 dose. Use in each nostril as directed   30 mL   12   . cholecalciferol (VITAMIN D) 1000 UNITS tablet   Oral   Take 1,000 Units by mouth daily.         . Cyanocobalamin (B-12) 1000 MCG CAPS   Oral   Take 1 capsule by mouth daily.         Marland Kitchen  docusate sodium (COLACE) 100 MG capsule   Oral   Take 100 mg by mouth daily as needed for constipation.         Marland Kitchen doxazosin (CARDURA) 4 MG tablet   Oral   Take 4 mg by mouth at bedtime.           . fluticasone (FLONASE) 50 MCG/ACT nasal spray   Nasal   Place 1 spray into the nose daily.         Marland Kitchen HYDROcodone-acetaminophen (NORCO/VICODIN) 5-325 MG per tablet   Oral   Take 1-2 tablets by mouth every 4 (four) hours as needed.   45 tablet   0   . latanoprost (XALATAN) 0.005 % ophthalmic solution   Left Eye   Place 1 drop into the left eye daily.         . ondansetron (ZOFRAN ODT) 4 MG disintegrating tablet   Oral   Take 1 tablet (4 mg total) by mouth every 8 (eight) hours as needed for nausea.   20 tablet   0   . polyethylene glycol powder (GLYCOLAX/MIRALAX) powder    Oral   Take 17 g by mouth daily.         . TUDORZA PRESSAIR 400 MCG/ACT AEPB   Oral   Take 1 puff by mouth daily.            Triage Vitals: BP 91/48  Pulse 94  Temp(Src) 97.8 F (36.6 C) (Oral)  Resp 16  SpO2 94%  Physical Exam  Nursing note and vitals reviewed. Constitutional: He is oriented to person, place, and time. He appears well-developed and well-nourished. No distress.  HENT:  Head: Normocephalic and atraumatic.  Eyes: EOM are normal. Pupils are equal, round, and reactive to light.  Neck: Neck supple. No tracheal deviation present.  Cardiovascular: Normal rate.   Pulmonary/Chest: Effort normal. No respiratory distress.  Abdominal: Soft. He exhibits no distension.  Staples intact to vertical incision on abdomen  Musculoskeletal: Normal range of motion. He exhibits no edema.  Neurological: He is alert and oriented to person, place, and time.  Skin: Skin is warm and dry.  Psychiatric: He has a normal mood and affect.    ED Course  Procedures (including critical care time) DIAGNOSTIC STUDIES: Oxygen Saturation is 94% on room air, adequate by my interpretation.    COORDINATION OF CARE: 23:21--I evaluated the patient and we discussed a treatment plan including dressing the area when it drains to which the pt agreed.    Dg Abd Acute W/chest  09/13/2012  *RADIOLOGY REPORT*  Clinical Data: Lower abdominal pain today, nausea, recent small bowel resection  ACUTE ABDOMEN SERIES (ABDOMEN 2 VIEW & CHEST 1 VIEW)  Comparison: Abdominal and pelvic CT04/02/2013  Findings: Normal heart size, mediastinal contours, and pulmonary vascularity. Minimal left basilar atelectasis. Right infrahilar density could represent atelectasis or infiltrate. Atherosclerotic calcification aorta. No pleural effusion or pneumothorax.  Small amount of stool in colon. Gaseous distention of transverse and proximal descending colon. Few minimally prominent small bowel loops are identified. Findings could  represent postoperative ileus. Numerous pelvic phleboliths. No definite obstruction, free intraperitoneal air or bowel wall thickening identified. Diffuse osseous demineralization.  IMPRESSION: Slight prominent loops of small bowel and left mid abdomen as well as gaseous distention of the transverse and proximal descending colon raising question of ileus. Minimal left basilar atelectasis with mild right infrahilar atelectasis versus infiltrate.   Original Report Authenticated By: Ulyses Southward, M.D.  MDM  Patient with serosanguinous drainage from wound on abdomen. No bleeding, no infection. Explained the drainage is normal.  Pt feels improved after observation and/or treatment in ED.Pt stable in ED with no significant deterioration in condition.The patient appears reasonably screened and/or stabilized for discharge and I doubt any other medical condition or other [facility] requiring further screening, evaluation, or treatment in the ED at this time prior to discharge.  I personally performed the services described in this documentation, which was scribed in my presence. The recorded information has been reviewed and considered.  MDM Reviewed: nursing note and vitals            Nicoletta Dress. Colon Branch, MD 09/13/12 2340

## 2012-09-15 ENCOUNTER — Encounter: Payer: Self-pay | Admitting: Emergency Medicine

## 2012-09-22 NOTE — Discharge Summary (Signed)
Physician Discharge Summary  Patient ID: Charles Golden MRN: [id number] DOB/AGE: 77-04-1926 77 y.o.  Admit date: 09/04/2012 Discharge date: 09/12/2012  Admission Diagnoses: Small bowel volvulus  Discharge Diagnoses: The same Active Problems:   * No active hospital problems. *   Discharged Condition: stable  Hospital Course: Patient presented to [facility] with abdominal pain. Workup and evaluation was suspicious initially for small bowel obstruction. However as his pain continued to increase in his abdominal exam became acute in nature he was taken to the operating room for emergent exploratory laparotomy. At this time was noted patient had a small bowel volvulus and a short segment of small bowel was resected. He underwent a primary anastomosis. He was transferred back to a medical surgical floor after a brief period in the recovery room. His postoperative course was somewhat delayed Due to a postoperative ileus. Upon return of bowel function he was advanced on his diet. Oral medications were resumed. Pain is controlled oral pain medications. On 09/12/2012 patient was tolerating regular diet, ambulating, and pain was controlled on oral pain medication. Plans are made for discharge.  Consults: None  Significant Diagnostic Studies: labs: Daily  Treatments: surgery: Support for laparotomy with small bowel resection  Discharge Exam: Blood pressure 117/83, pulse 82, temperature 97.2 F (36.2 C), temperature source Oral, resp. rate 16, height 5\' 10"  (1.778 m), weight 81.647 kg (180 lb), SpO2 97.00%. General appearance: alert and no distress Resp: clear to auscultation bilaterally Cardio: regular rate and rhythm GI: Positive bowel sounds, soft, flat, expected postoperative tenderness. Majority of his tenderness is around the midline incision. Staples are in place. Incision is clean dry and intact. No peritoneal signs.  Disposition: 01-Home or Self Care  Discharge Orders   Future  Appointments Provider Department Dept Phone   01/05/2013 3:00 PM Ernestina Penna, MD [facility] [phone]   Future Orders Complete By Expires     Call MD for:  persistant nausea and vomiting  As directed     Call MD for:  redness, tenderness, or signs of infection (pain, swelling, redness, odor or green/yellow discharge around incision site)  As directed     Call MD for:  severe uncontrolled pain  As directed     Call MD for:  temperature >100.4  As directed     Diet - low sodium heart healthy  As directed     Discharge instructions  As directed     Comments:      Increase activity as tolerated. May place ice pack for comfort.  Alternate an anti-inflammatory such as ibuprofen (Motrin, Advil) 400-600mg  every 6 hours with the prescribed pain medication.   Do not take any additional acetaminophen as there is Tylenol in the pain medication.    Discharge wound care:  As directed     Comments:      Clean surgical sites with soap and water.  May shower the morning after surgery unless instructed by Dr. Leticia Penna otherwise.  No soaking for 2-3 weeks.    If adhesive strips are in place, they may be removed in 1-2 weeks while in the shower.    Driving Restrictions  As directed     Comments:      No driving while on pain medications.    Increase activity slowly  As directed     Lifting restrictions  As directed     Comments:      No lifting over 20lbs for 4-5 weeks post-op.  Medication List    STOP taking these medications       cephALEXin 500 MG capsule  Commonly known as:  KEFLEX      TAKE these medications       aspirin 81 MG tablet  Take 81 mg by mouth daily.     azelastine 137 MCG/SPRAY nasal spray  Commonly known as:  ASTELIN  Place 1 spray into the nose 1 day or 1 dose. Use in each nostril as directed     doxazosin 4 MG tablet  Commonly known as:  CARDURA  Take 4 mg by mouth at bedtime.     fluticasone 50 MCG/ACT nasal spray  Commonly known  as:  FLONASE  Place 1 spray into the nose daily.     HYDROcodone-acetaminophen 5-325 MG per tablet  Commonly known as:  NORCO/VICODIN  Take 1-2 tablets by mouth every 4 (four) hours as needed.     latanoprost 0.005 % ophthalmic solution  Commonly known as:  XALATAN  Place 1 drop into the left eye daily.     TUDORZA PRESSAIR 400 MCG/ACT Aepb  Generic drug:  Aclidinium Bromide  Take 1 puff by mouth daily.           Follow-up Information   Follow up with Fabio Bering, MD In 1 week.   Contact information:   [address] [place] [address] [phone]       Signed: Fabio Bering 09/22/2012, 2:56 PM

## 2012-09-23 ENCOUNTER — Telehealth: Payer: Self-pay | Admitting: Family Medicine

## 2012-09-23 NOTE — Telephone Encounter (Signed)
Advised patient's wife that Charles Golden needs to be seen.    He last saw his surgeon last week and has a f/u appt scheduled with him on 10/07/12.   Continues to have weakness and doesn't feel like doing daily activities.    Appt scheduled with Dr. Christell Constant on 09/25/12.

## 2012-09-25 ENCOUNTER — Encounter: Payer: Self-pay | Admitting: Family Medicine

## 2012-09-25 ENCOUNTER — Telehealth: Payer: Self-pay | Admitting: *Deleted

## 2012-09-25 ENCOUNTER — Ambulatory Visit (INDEPENDENT_AMBULATORY_CARE_PROVIDER_SITE_OTHER): Payer: Medicare Other | Admitting: Family Medicine

## 2012-09-25 VITALS — BP 116/71 | HR 80 | Temp 98.3°F | Ht 71.0 in | Wt 159.0 lb

## 2012-09-25 DIAGNOSIS — R5383 Other fatigue: Secondary | ICD-10-CM

## 2012-09-25 DIAGNOSIS — D649 Anemia, unspecified: Secondary | ICD-10-CM

## 2012-09-25 DIAGNOSIS — Z9889 Other specified postprocedural states: Secondary | ICD-10-CM

## 2012-09-25 DIAGNOSIS — K562 Volvulus: Secondary | ICD-10-CM | POA: Diagnosis not present

## 2012-09-25 DIAGNOSIS — R5381 Other malaise: Secondary | ICD-10-CM | POA: Diagnosis not present

## 2012-09-25 DIAGNOSIS — R109 Unspecified abdominal pain: Secondary | ICD-10-CM

## 2012-09-25 LAB — POCT CBC
Lymph, poc: 2.2 (ref 0.6–3.4)
MCH, POC: 29.2 pg (ref 27–31.2)
MCHC: 33.2 g/dL (ref 31.8–35.4)
MPV: 6.6 fL (ref 0–99.8)
POC LYMPH PERCENT: 24.7 %L (ref 10–50)
Platelet Count, POC: 528 10*3/uL — AB (ref 142–424)
WBC: 8.8 10*3/uL (ref 4.6–10.2)

## 2012-09-25 LAB — BASIC METABOLIC PANEL WITH GFR
Chloride: 103 mEq/L (ref 96–112)
Creat: 1.36 mg/dL — ABNORMAL HIGH (ref 0.50–1.35)
GFR, Est Non African American: 47 mL/min — ABNORMAL LOW

## 2012-09-25 LAB — HEPATIC FUNCTION PANEL
Albumin: 3.4 g/dL — ABNORMAL LOW (ref 3.5–5.2)
Alkaline Phosphatase: 67 U/L (ref 39–117)
Total Protein: 6.1 g/dL (ref 6.0–8.3)

## 2012-09-25 MED ORDER — HEMOCYTE-PLUS 106-1 MG PO TABS: 1.0000 | ORAL_TABLET | Freq: Every day | ORAL | Status: DC

## 2012-09-25 NOTE — Progress Notes (Signed)
  Subjective:    Patient ID: Charles Golden, male    DOB: 04-17-1926, 79 y.o.   MRN: [id number]  HPI This patient is status post abdominal surgery for a volvulus on April 10. He has done well postop except for weakness and continued abdominal pain. He is having normal bowel movements still taking stool softeners to help him.   Review of Systems  Constitutional: Positive for appetite change (decreased) and fatigue (due to surgery).  Gastrointestinal: Positive for nausea and abdominal pain (continues after surgery on 09/04/2012).  Genitourinary: Negative for frequency.  Neurological: Positive for headaches.       Objective:   Physical Exam  Constitutional: He is oriented to person, place, and time. He appears well-developed and well-nourished. No distress.  HENT:  Head: Normocephalic and atraumatic.  Right Ear: External ear normal.  Left Ear: External ear normal.  Nose: Nose normal.  Mouth/Throat: Oropharynx is clear and moist.  Eyes: Conjunctivae are normal.  Neck: Normal range of motion. Neck supple. No thyromegaly present.  Cardiovascular: Normal rate, regular rhythm and normal heart sounds.  Exam reveals no friction rub.   No murmur heard. 96 per minute and regular  Pulmonary/Chest: Effort normal and breath sounds normal. No respiratory distress. He has no wheezes. He has no rales.  Abdominal: Soft. He exhibits no mass. There is tenderness (generally tender). There is no rebound and no guarding.  Wound has a soft area this a little fluctuant. Minimal redness  Musculoskeletal: Normal range of motion. He exhibits no edema and no tenderness.  Lymphadenopathy:    He has no cervical adenopathy.  Neurological: He is alert and oriented to person, place, and time.  Skin: Skin is warm and dry.  Psychiatric: He has a normal mood and affect. His behavior is normal. Judgment and thought content normal.   Results for orders placed in visit on 09/25/12  POCT CBC      Result Value Range   WBC 8.8  4.6 - 10.2 K/uL   Lymph, poc 2.2  0.6 - 3.4   POC LYMPH PERCENT 24.7  10 - 50 %L   POC Granulocyte 6.2  2 - 6.9   Granulocyte percent 70.5  37 - 80 %G   RBC 4.0 (*) 4.69 - 6.13 M/uL   Hemoglobin 11.6 (*) 14.1 - 18.1 g/dL   HCT, POC 40.9 (*) 81.1 - 53.7 %   MCV 87.9  80 - 97 fL   MCH, POC 29.2  27 - 31.2 pg   MCHC 33.2  31.8 - 35.4 g/dL   RDW, POC 91.4     Platelet Count, POC 528.0 (*) 142 - 424 K/uL   MPV 6.6  0 - 99.8 fL          Assessment & Plan:  1. Fatigue - POCT CBC star otc iron -BM P. and LFTs  2. H/O abdominal surgery  3. Volvulus of small intestine  4. Abdominal pain, unspecified site  Patient Instructions  Keep drinking plenty of fluids. Try to do more walking on a flat surface. Take Ensure one to 2 cans daily to help get strength back more quickly Monitor blood pressure to make sure that it's not too low especially when at home

## 2012-09-25 NOTE — Telephone Encounter (Signed)
Message copied by Bearl Mulberry on Thu Sep 25, 2012  6:28 PM ------      Message from: Ernestina Penna      Created: Thu Sep 25, 2012  6:10 PM       Liver function tests are within normal limits except the total protein is slightly decreased, however it is improved from one week ago      Electrolytes are good. Blood sugar slightly increased at 105. 1 kidney function test, the creatinine, is slightly elevated at 1.36 and this is about the same as it was 2 weeks ago      Patient is aware of CBC which had a hemoglobin of 11.6 which is also improving--- hemocyte-plus to be called into [facility]      Notify patient and wife       ------

## 2012-09-25 NOTE — Telephone Encounter (Signed)
Pt's wife notified of results Repeat CBC in 4 weeks

## 2012-09-25 NOTE — Patient Instructions (Signed)
Keep drinking plenty of fluids. Try to do more walking on a flat surface. Take Ensure one to 2 cans daily to help get strength back more quickly Monitor blood pressure to make sure that it's not too low especially when at home

## 2012-09-25 NOTE — Addendum Note (Signed)
Addended by: Bearl Mulberry on: 09/25/2012 12:03 PM   Modules accepted: Orders

## 2012-10-17 ENCOUNTER — Telehealth: Payer: Self-pay | Admitting: Family Medicine

## 2012-10-17 DIAGNOSIS — J329 Chronic sinusitis, unspecified: Secondary | ICD-10-CM

## 2012-10-17 MED ORDER — FLUTICASONE PROPIONATE 50 MCG/ACT NA SUSP: 1.0000 | Freq: Every day | NASAL | Status: DC

## 2012-10-17 MED ORDER — AZELASTINE HCL 0.1 % NA SOLN: 1.0000 | NASAL | Status: DC

## 2012-10-17 NOTE — Telephone Encounter (Signed)
rx's sent [facility]

## 2012-12-02 DIAGNOSIS — Z961 Presence of intraocular lens: Secondary | ICD-10-CM | POA: Diagnosis not present

## 2012-12-02 DIAGNOSIS — H26499 Other secondary cataract, unspecified eye: Secondary | ICD-10-CM | POA: Diagnosis not present

## 2012-12-02 DIAGNOSIS — H40059 Ocular hypertension, unspecified eye: Secondary | ICD-10-CM | POA: Diagnosis not present

## 2012-12-02 DIAGNOSIS — H40029 Open angle with borderline findings, high risk, unspecified eye: Secondary | ICD-10-CM | POA: Diagnosis not present

## 2012-12-31 DIAGNOSIS — N476 Balanoposthitis: Secondary | ICD-10-CM | POA: Diagnosis not present

## 2012-12-31 DIAGNOSIS — N401 Enlarged prostate with lower urinary tract symptoms: Secondary | ICD-10-CM | POA: Diagnosis not present

## 2013-01-05 ENCOUNTER — Ambulatory Visit (INDEPENDENT_AMBULATORY_CARE_PROVIDER_SITE_OTHER): Payer: Medicare Other | Admitting: Family Medicine

## 2013-01-05 ENCOUNTER — Encounter: Payer: Self-pay | Admitting: Family Medicine

## 2013-01-05 VITALS — BP 112/72 | HR 66 | Temp 98.2°F | Ht 70.0 in | Wt 163.8 lb

## 2013-01-05 DIAGNOSIS — E7800 Pure hypercholesterolemia, unspecified: Secondary | ICD-10-CM

## 2013-01-05 DIAGNOSIS — R05 Cough: Secondary | ICD-10-CM

## 2013-01-05 DIAGNOSIS — E559 Vitamin D deficiency, unspecified: Secondary | ICD-10-CM

## 2013-01-05 DIAGNOSIS — R5383 Other fatigue: Secondary | ICD-10-CM

## 2013-01-05 DIAGNOSIS — N400 Enlarged prostate without lower urinary tract symptoms: Secondary | ICD-10-CM

## 2013-01-05 DIAGNOSIS — K589 Irritable bowel syndrome without diarrhea: Secondary | ICD-10-CM

## 2013-01-05 DIAGNOSIS — J329 Chronic sinusitis, unspecified: Secondary | ICD-10-CM

## 2013-01-05 DIAGNOSIS — R059 Cough, unspecified: Secondary | ICD-10-CM

## 2013-01-05 DIAGNOSIS — J438 Other emphysema: Secondary | ICD-10-CM | POA: Diagnosis not present

## 2013-01-05 DIAGNOSIS — B351 Tinea unguium: Secondary | ICD-10-CM

## 2013-01-05 NOTE — Progress Notes (Signed)
Subjective:    Patient ID: Charles Golden, male    DOB: [birth year], 79 y.o.   MRN: [id number]  HPI Patient comes in today for followup of chronic medical problems and their management. He is status post small bowel resection secondary to a volvulus. He has a history of COPD and chronic rhinosinusitis. He also sees the urologist because of the elevated PSA and BPH. He saw the urologist last week in his physical exam and pertinent findings are stable according to the patient. Patient says he is slowly regaining his energy and his bowels are moving back to normal. Stools are now formed and have a normal color. Patient has had increased headaches over both eyes for the past 4 or 5 months.   Review of Systems  Constitutional: Positive for fatigue (improving since surgery).  HENT: Positive for congestion, sore throat (scratchy) and sinus pressure. Negative for ear pain.   Eyes: Negative.  Negative for photophobia, pain, discharge, redness and itching.  Respiratory: Positive for cough (productive) and shortness of breath. Negative for choking and wheezing.   Cardiovascular: Negative for chest pain, palpitations and leg swelling.  Gastrointestinal: Positive for abdominal pain (slight). Negative for nausea, vomiting, diarrhea, constipation and abdominal distention.  Endocrine: Negative.  Negative for cold intolerance, heat intolerance, polydipsia, polyphagia and polyuria.  Genitourinary: Positive for frequency (due to BPH, sees Dr Annabell Howells). Negative for dysuria and hematuria.  Musculoskeletal: Negative.  Negative for back pain, joint swelling and arthralgias.  Skin: Negative.  Negative for color change, pallor, rash and wound.  Neurological: Positive for headaches (2-3 x week).  Hematological: Negative.   Psychiatric/Behavioral: Negative for confusion, sleep disturbance, decreased concentration and agitation. The patient is not nervous/anxious.        Objective:   Physical Exam BP 112/72  Pulse 66   Temp(Src) 98.2 F (36.8 C) (Oral)  Ht 5\' 10"  (1.778 m)  Wt 163 lb 12.8 oz (74.299 kg)  BMI 23.5 kg/m2  The patient appeared well nourished and normally developed for his age, alert and oriented to time and place. Speech, behavior and judgement appear normal. Vital signs as documented.  Head exam is unremarkable. No scleral icterus or pallor noted. He has diminished hearing bilaterally. For him, surprisingly, the nasal passages were clear and the mouth and throat were normal. The ear canals and TMs were normal.  Neck is without jugular venous distension, thyromegally, or carotid bruits. Carotid upstrokes are brisk bilaterally. No cervical adenopathy. Lungs are clear anteriorly and posteriorly to auscultation. Normal respiratory effort. There is no wheezing or rales heard today. Patient had a dry cough. Cardiac exam reveals regular rate and rhythm at 72 per minute. First and second heart sounds normal.  No murmurs, rubs or gallops.  Abdominal exam reveals normal bowl sounds, no masses, no organomegaly and no aortic enlargement. No inguinal adenopathy. There was no abdominal tenderness and the scar from the volvulus surgery appears to be healing well. Extremities are nonedematous and both femoral and pedal pulses are normal. Skin without pallor or jaundice.  Warm and dry, without rash. Neurologic exam reveals diminished deep tendon reflexes and but normal sensation.          Assessment & Plan:  1. Chronic rhinosinusitis -Continue Nasal inhalers and pulmonary inhalers  2. Cough  3. EMPHYSEMA  4. IBS (irritable bowel syndrome)  5. BPH (benign prostatic hyperplasia) -Followed by Dr. Annabell Howells  Lab order will be placed for fasting lab work which will include a CBC, BMP, LFTs and lipid liver.  Also he will need a thyroid profile and a vitamin D level  Patient Instructions  Fall precautions discussed Continue current meds and therapeutic lifestyle changes Return to clinic in September or  October for flu shot Return to clinic for fasting lab work    Nyra Capes MD

## 2013-01-05 NOTE — Patient Instructions (Addendum)
Fall precautions discussed Continue current meds and therapeutic lifestyle changes Return to clinic in September or October for flu shot Return to clinic for fasting lab work If headaches continue call us and we will arrange an appointment with a neurologist. Continue to use saline irrigation  We will schedule visit with Dr. Terri Piedra to look at the persistent problems with the nail of the left index finger

## 2013-01-05 NOTE — Addendum Note (Signed)
Addended by: Bearl Mulberry on: 01/05/2013 06:46 PM   Modules accepted: Orders

## 2013-01-15 ENCOUNTER — Other Ambulatory Visit (INDEPENDENT_AMBULATORY_CARE_PROVIDER_SITE_OTHER): Payer: Medicare Other

## 2013-01-15 DIAGNOSIS — E559 Vitamin D deficiency, unspecified: Secondary | ICD-10-CM

## 2013-01-15 DIAGNOSIS — R5381 Other malaise: Secondary | ICD-10-CM

## 2013-01-15 DIAGNOSIS — R5383 Other fatigue: Secondary | ICD-10-CM

## 2013-01-15 DIAGNOSIS — E7800 Pure hypercholesterolemia, unspecified: Secondary | ICD-10-CM | POA: Diagnosis not present

## 2013-01-15 LAB — POCT CBC
Hemoglobin: 14 g/dL — AB (ref 14.1–18.1)
MCH, POC: 29.8 pg (ref 27–31.2)
MCV: 88.5 fL (ref 80–97)
Platelet Count, POC: 229 10*3/uL (ref 142–424)
RBC: 4.7 M/uL (ref 4.69–6.13)
WBC: 7.1 10*3/uL (ref 4.6–10.2)

## 2013-01-16 LAB — HEPATIC FUNCTION PANEL
ALT: 8 IU/L (ref 0–44)
AST: 14 IU/L (ref 0–40)
Alkaline Phosphatase: 77 IU/L (ref 39–117)
Bilirubin, Direct: 0.1 mg/dL (ref 0.00–0.40)
Total Bilirubin: 0.3 mg/dL (ref 0.0–1.2)

## 2013-01-16 LAB — BMP8+EGFR
BUN/Creatinine Ratio: 15 (ref 10–22)
Creatinine, Ser: 1.27 mg/dL (ref 0.76–1.27)
GFR calc Af Amer: 59 mL/min/{1.73_m2} — ABNORMAL LOW (ref >59)
GFR calc non Af Amer: 51 mL/min/{1.73_m2} — ABNORMAL LOW (ref >59)
Sodium: 143 mmol/L (ref 134–144)

## 2013-01-16 LAB — NMR, LIPOPROFILE
LDL Size: 21.3 nm (ref >20.5)
Small LDL Particle Number: <90 nmol/L (ref <=527)

## 2013-01-16 LAB — VITAMIN D 25 HYDROXY (VIT D DEFICIENCY, FRACTURES): Vit D, 25-Hydroxy: 37.1 ng/mL (ref 30.0–100.0)

## 2013-01-28 NOTE — Progress Notes (Signed)
Patient came in for labs only.

## 2013-01-30 DIAGNOSIS — M7130 Other bursal cyst, unspecified site: Secondary | ICD-10-CM | POA: Diagnosis not present

## 2013-02-16 ENCOUNTER — Other Ambulatory Visit (INDEPENDENT_AMBULATORY_CARE_PROVIDER_SITE_OTHER): Payer: Medicare Other

## 2013-02-16 DIAGNOSIS — R7989 Other specified abnormal findings of blood chemistry: Secondary | ICD-10-CM | POA: Diagnosis not present

## 2013-02-16 NOTE — Progress Notes (Signed)
Patient came in for labs only.

## 2013-02-17 LAB — BMP8+EGFR
BUN/Creatinine Ratio: 13 (ref 10–22)
BUN: 17 mg/dL (ref 8–27)
CO2: 27 mmol/L (ref 18–29)
Calcium: 9.2 mg/dL (ref 8.6–10.2)
Chloride: 103 mmol/L (ref 97–108)
Creatinine, Ser: 1.29 mg/dL — ABNORMAL HIGH (ref 0.76–1.27)
GFR calc Af Amer: 58 mL/min/{1.73_m2} — ABNORMAL LOW
GFR calc non Af Amer: 50 mL/min/{1.73_m2} — ABNORMAL LOW
Glucose: 96 mg/dL (ref 65–99)
Potassium: 5.2 mmol/L (ref 3.5–5.2)
Sodium: 142 mmol/L (ref 134–144)

## 2013-02-17 NOTE — Progress Notes (Signed)
Pt aware.

## 2013-03-20 ENCOUNTER — Ambulatory Visit (INDEPENDENT_AMBULATORY_CARE_PROVIDER_SITE_OTHER): Payer: Medicare Other | Admitting: *Deleted

## 2013-03-20 DIAGNOSIS — Z23 Encounter for immunization: Secondary | ICD-10-CM | POA: Diagnosis not present

## 2013-04-15 ENCOUNTER — Ambulatory Visit (INDEPENDENT_AMBULATORY_CARE_PROVIDER_SITE_OTHER): Payer: Medicare Other | Admitting: Family Medicine

## 2013-04-15 VITALS — BP 133/62 | HR 84 | Temp 97.1°F | Ht 70.0 in | Wt 167.0 lb

## 2013-04-15 DIAGNOSIS — E7800 Pure hypercholesterolemia, unspecified: Secondary | ICD-10-CM | POA: Diagnosis not present

## 2013-04-15 DIAGNOSIS — L089 Local infection of the skin and subcutaneous tissue, unspecified: Secondary | ICD-10-CM

## 2013-04-15 DIAGNOSIS — E559 Vitamin D deficiency, unspecified: Secondary | ICD-10-CM

## 2013-04-15 DIAGNOSIS — J329 Chronic sinusitis, unspecified: Secondary | ICD-10-CM

## 2013-04-15 DIAGNOSIS — R5381 Other malaise: Secondary | ICD-10-CM | POA: Diagnosis not present

## 2013-04-15 DIAGNOSIS — K589 Irritable bowel syndrome without diarrhea: Secondary | ICD-10-CM

## 2013-04-15 DIAGNOSIS — M25559 Pain in unspecified hip: Secondary | ICD-10-CM

## 2013-04-15 DIAGNOSIS — R5383 Other fatigue: Secondary | ICD-10-CM

## 2013-04-15 DIAGNOSIS — N400 Enlarged prostate without lower urinary tract symptoms: Secondary | ICD-10-CM

## 2013-04-15 DIAGNOSIS — J441 Chronic obstructive pulmonary disease with (acute) exacerbation: Secondary | ICD-10-CM | POA: Diagnosis not present

## 2013-04-15 DIAGNOSIS — L0889 Other specified local infections of the skin and subcutaneous tissue: Secondary | ICD-10-CM

## 2013-04-15 DIAGNOSIS — K130 Diseases of lips: Secondary | ICD-10-CM

## 2013-04-15 DIAGNOSIS — M25552 Pain in left hip: Secondary | ICD-10-CM

## 2013-04-15 DIAGNOSIS — L723 Sebaceous cyst: Secondary | ICD-10-CM

## 2013-04-15 LAB — POCT CBC
Granulocyte percent: 68.2 %G (ref 37–80)
HCT, POC: 41.9 % — AB (ref 43.5–53.7)
Hemoglobin: 13.5 g/dL — AB (ref 14.1–18.1)
MCHC: 32.2 g/dL (ref 31.8–35.4)
MCV: 88.6 fL (ref 80–97)
RDW, POC: 14.3 %
WBC: 8.8 10*3/uL (ref 4.6–10.2)

## 2013-04-16 ENCOUNTER — Ambulatory Visit (INDEPENDENT_AMBULATORY_CARE_PROVIDER_SITE_OTHER): Payer: Medicare Other

## 2013-04-16 ENCOUNTER — Encounter: Payer: Self-pay | Admitting: Family Medicine

## 2013-04-16 DIAGNOSIS — M25559 Pain in unspecified hip: Secondary | ICD-10-CM

## 2013-04-16 DIAGNOSIS — M25552 Pain in left hip: Secondary | ICD-10-CM

## 2013-04-16 LAB — HEPATIC FUNCTION PANEL
AST: 12 IU/L (ref 0–40)
Albumin: 3.8 g/dL (ref 3.5–4.7)
Alkaline Phosphatase: 89 IU/L (ref 39–117)
Bilirubin, Direct: 0.1 mg/dL (ref 0.00–0.40)
Total Bilirubin: 0.3 mg/dL (ref 0.0–1.2)
Total Protein: 6.1 g/dL (ref 6.0–8.5)

## 2013-04-16 LAB — BMP8+EGFR
BUN/Creatinine Ratio: 16 (ref 10–22)
BUN: 21 mg/dL (ref 8–27)
Chloride: 105 mmol/L (ref 97–108)
GFR calc Af Amer: 56 mL/min/{1.73_m2} — ABNORMAL LOW (ref >59)
GFR calc non Af Amer: 48 mL/min/{1.73_m2} — ABNORMAL LOW (ref >59)

## 2013-04-16 LAB — LIPID PANEL
Chol/HDL Ratio: 2.7 ratio units (ref 0.0–5.0)
HDL: 57 mg/dL (ref >39)
LDL Calculated: 79 mg/dL (ref 0–99)
Triglycerides: 88 mg/dL (ref 0–149)
VLDL Cholesterol Cal: 18 mg/dL (ref 5–40)

## 2013-04-16 LAB — VITAMIN D 25 HYDROXY (VIT D DEFICIENCY, FRACTURES): Vit D, 25-Hydroxy: 46 ng/mL (ref 30.0–100.0)

## 2013-04-16 NOTE — Patient Instructions (Addendum)
Continue current medications. Continue good therapeutic lifestyle changes which include good diet and exercise. Fall precautions discussed with patient. Schedule your flu vaccine if you haven't had it yet If you are over 77 years old - you may need Prevnar 13 or the adult Pneumonia vaccine. Take antibiotics as directed Use warm compresses over the sebaceous cyst on the back Use a coolmist humidifier and saline nose spray and a Nettie pot

## 2013-04-16 NOTE — Addendum Note (Signed)
Addended by: Magdalene River on: 04/16/2013 12:14 PM   Modules accepted: Orders, Medications

## 2013-04-16 NOTE — Addendum Note (Signed)
Addended by: Ernestina Penna on: 04/16/2013 12:57 PM   Modules accepted: Level of Service

## 2013-04-16 NOTE — Progress Notes (Addendum)
Subjective:    Patient ID: Charles Golden, male    DOB: 1926/03/01, 79 y.o.   MRN: [id number]  HPI Pt here for follow up and management of chronic medical problems. Charles Golden comes in today with several complaints. Charles Golden has had increased cough, congestion in the head, and headache. Charles Golden also complains of a painful lump on his back. Charles Golden also complains of left hip pain.     Patient Active Problem List   Diagnosis Date Noted  . BPH (benign prostatic hyperplasia) 01/05/2013  . IBS (irritable bowel syndrome) 09/03/2012  . Chronic rhinosinusitis 09/03/2012  . EMPHYSEMA 03/21/2010  . COUGH 03/21/2010   Outpatient Encounter Prescriptions as of 04/15/2013  Medication Sig  . aspirin 81 MG tablet Take 81 mg by mouth daily.    Marland Kitchen azelastine (ASTELIN) 137 MCG/SPRAY nasal spray Place 1 spray into the nose 1 day or 1 dose. Use in each nostril as directed  . cholecalciferol (VITAMIN D) 1000 UNITS tablet Take 1,000 Units by mouth daily.  . Cyanocobalamin (B-12) 1000 MCG CAPS Take 1 capsule by mouth daily.  Marland Kitchen doxazosin (CARDURA) 4 MG tablet Take 4 mg by mouth at bedtime.    . fluticasone (FLONASE) 50 MCG/ACT nasal spray Place 1 spray into the nose daily.  Marland Kitchen guaiFENesin (MUCINEX) 600 MG 12 hr tablet Take 1,200 mg by mouth 2 (two) times daily.  Marland Kitchen latanoprost (XALATAN) 0.005 % ophthalmic solution Place 1 drop into the left eye daily.  . Probiotic Product (ALIGN) 4 MG CAPS Take 1 tablet by mouth daily.  . TUDORZA PRESSAIR 400 MCG/ACT AEPB Take 1 puff by mouth daily.     Review of Systems  Constitutional: Negative.   Eyes: Negative.   Respiratory: Positive for cough.   Cardiovascular: Negative.   Gastrointestinal: Negative.   Endocrine: Negative.   Genitourinary: Negative.   Musculoskeletal: Positive for arthralgias (left hip pain).  Skin: Negative.   Allergic/Immunologic: Negative.   Neurological: Positive for headaches.  Hematological: Negative.   Psychiatric/Behavioral: Negative.         Objective:   Physical Exam  Nursing note and vitals reviewed. Constitutional: Charles Golden is oriented to person, place, and time. Charles Golden appears well-developed and well-nourished. No distress.  HENT:  Head: Normocephalic and atraumatic.  Right Ear: External ear normal.  Left Ear: External ear normal.  Mouth/Throat: Oropharynx is clear and moist. No oropharyngeal exudate.  Increased bilateral nasal turbinate congestion and swelling There is minimal frontal and maxillary sinus tenderness to palpation. There is some inflammation in at the left corner of his mouth externally  Eyes: Conjunctivae and EOM are normal. Pupils are equal, round, and reactive to light. Right eye exhibits no discharge. Left eye exhibits no discharge. No scleral icterus.  Neck: Normal range of motion. Neck supple. No tracheal deviation present. No thyromegaly present.  Cardiovascular: Normal rate, regular rhythm, normal heart sounds and intact distal pulses.  Exam reveals no gallop and no friction rub.   No murmur heard. Pulmonary/Chest: Effort normal. No respiratory distress. Charles Golden has no wheezes. Charles Golden has no rales. Charles Golden exhibits no tenderness.  Patient had a tight cough  Abdominal: Soft. Bowel sounds are normal. Charles Golden exhibits no mass. There is tenderness (generalized abdominal tenderness that was mild in nature). There is no rebound and no guarding.  Musculoskeletal: Normal range of motion. Charles Golden exhibits no edema and no tenderness.  There is specific point tenderness in the left lateral hip joint  Lymphadenopathy:    Charles Golden has no cervical adenopathy.  Neurological: Charles Golden  is alert and oriented to person, place, and time. Charles Golden has normal reflexes. No cranial nerve deficit.  Skin: Skin is warm and dry. No rash noted. There is erythema. No pallor.  There is a 1-1/2 inch size slightly inflamed epidermal cyst over the thoracic spine  Psychiatric: Charles Golden has a normal mood and affect. His behavior is normal. Judgment and thought content normal.   BP 133/62   Pulse 84  Temp(Src) 97.1 F (36.2 C) (Oral)  Ht 5\' 10"  (1.778 m)  Wt 167 lb (75.751 kg)  BMI 23.96 kg/m2 Results for orders placed in visit on 04/15/13  BMP8+EGFR      Result Value Range   Glucose 94  65 - 99 mg/dL   BUN 21  8 - 27 mg/dL   Creatinine, Ser 1.61 (*) 0.76 - 1.27 mg/dL   GFR calc non Af Amer 48 (*) >59 mL/min/1.73   GFR calc Af Amer 56 (*) >59 mL/min/1.73   BUN/Creatinine Ratio 16  10 - 22   Sodium 144  134 - 144 mmol/L   Potassium 4.5  3.5 - 5.2 mmol/L   Chloride 105  97 - 108 mmol/L   CO2 27  18 - 29 mmol/L   Calcium 9.0  8.6 - 10.2 mg/dL  HEPATIC FUNCTION PANEL      Result Value Range   Total Protein 6.1  6.0 - 8.5 g/dL   Albumin 3.8  3.5 - 4.7 g/dL   Total Bilirubin 0.3  0.0 - 1.2 mg/dL   Bilirubin, Direct 0.96  0.00 - 0.40 mg/dL   Alkaline Phosphatase 89  39 - 117 IU/L   AST 12  0 - 40 IU/L   ALT 11  0 - 44 IU/L  VITAMIN D 25 HYDROXY      Result Value Range   Vit D, 25-Hydroxy 46.0  30.0 - 100.0 ng/mL  LIPID PANEL      Result Value Range   Cholesterol, Total 154  100 - 199 mg/dL   Triglycerides 88  0 - 149 mg/dL   HDL 57  >04 mg/dL   VLDL Cholesterol Cal 18  5 - 40 mg/dL   LDL Calculated 79  0 - 99 mg/dL   Chol/HDL Ratio 2.7  0.0 - 5.0 ratio units  POCT CBC      Result Value Range   WBC 8.8  4.6 - 10.2 K/uL   Lymph, poc 2.4  0.6 - 3.4   POC LYMPH PERCENT 27.2  10 - 50 %L   POC Granulocyte 6.0  2 - 6.9   Granulocyte percent 68.2  37 - 80 %G   RBC 4.7  4.69 - 6.13 M/uL   Hemoglobin 13.5 (*) 14.1 - 18.1 g/dL   HCT, POC 54.0 (*) 98.1 - 53.7 %   MCV 88.6  80 - 97 fL   MCH, POC 28.5  27 - 31.2 pg   MCHC 32.2  31.8 - 35.4 g/dL   RDW, POC 19.1     Platelet Count, POC 274.0  142 - 424 K/uL   MPV 7.0  0 - 99.8 fL          Assessment & Plan:   1. Chronic rhinosinusitis, new exacerbation   2. COPD exacerbation   3. Fatigue   4. Elevated LDL cholesterol level   5. Vitamin D deficiency   6. BPH (benign prostatic hyperplasia)   7. IBS  (irritable bowel syndrome)   8. Left hip pain   9. Infected sebaceous cyst  10. Perleche    Orders Placed This Encounter  Procedures  . DG Hip Complete Left    Standing Status: Future     Number of Occurrences: 1     Standing Expiration Date: 06/16/2014    Order Specific Question:  Reason for Exam (SYMPTOM  OR DIAGNOSIS REQUIRED)    Answer:  left hip pain    Order Specific Question:  Preferred imaging location?    Answer:  Internal  . Lipid panel   Keflex 500 mg #30 one 3 times daily for infection until completed was called into patient's pharmacy T-tube computer average on this particular day of visit  Patient Instructions  Continue current medications. Continue good therapeutic lifestyle changes which include good diet and exercise. Fall precautions discussed with patient. Schedule your flu vaccine if you haven't had it yet If you are over 66 years old - you may need Prevnar 13 or the adult Pneumonia vaccine. Take antibiotics as directed Use warm compresses over the sebaceous cyst on the back Use a coolmist humidifier and saline nose spray and a Nettie pot   Nyra Capes MD

## 2013-04-28 ENCOUNTER — Ambulatory Visit (INDEPENDENT_AMBULATORY_CARE_PROVIDER_SITE_OTHER): Payer: Medicare Other

## 2013-04-28 ENCOUNTER — Encounter: Payer: Self-pay | Admitting: Family Medicine

## 2013-04-28 ENCOUNTER — Ambulatory Visit (INDEPENDENT_AMBULATORY_CARE_PROVIDER_SITE_OTHER): Payer: Medicare Other | Admitting: Family Medicine

## 2013-04-28 VITALS — BP 106/68 | HR 67 | Temp 98.2°F | Ht 70.0 in | Wt 166.4 lb

## 2013-04-28 DIAGNOSIS — J329 Chronic sinusitis, unspecified: Secondary | ICD-10-CM

## 2013-04-28 DIAGNOSIS — J441 Chronic obstructive pulmonary disease with (acute) exacerbation: Secondary | ICD-10-CM

## 2013-04-28 DIAGNOSIS — L723 Sebaceous cyst: Secondary | ICD-10-CM

## 2013-04-28 DIAGNOSIS — R51 Headache: Secondary | ICD-10-CM

## 2013-04-28 DIAGNOSIS — R05 Cough: Secondary | ICD-10-CM

## 2013-04-28 DIAGNOSIS — L089 Local infection of the skin and subcutaneous tissue, unspecified: Secondary | ICD-10-CM

## 2013-04-28 MED ORDER — LEVOFLOXACIN 500 MG PO TABS: 500.0000 mg | ORAL_TABLET | Freq: Every day | ORAL | Status: DC

## 2013-04-28 NOTE — Addendum Note (Signed)
Addended by: Orma Render F on: 04/28/2013 04:35 PM   Modules accepted: Orders

## 2013-04-28 NOTE — Progress Notes (Signed)
Subjective:    Patient ID: Charles Golden, male    DOB: 12-17-1925, 79 y.o.   MRN: [id number]  HPI Pt here for 10 day recheck on rhinosinusitits and copd. patient comes in visit today with his wife. He continues to have headaches. The cyst has not drained on his back. He is having purulent sputum from his coughing and chest congestion. He also complains of left hip pain continuing. The hip x-ray result was reviewed with patient and wife and shows degenerative change.    Patient Active Problem List   Diagnosis Date Noted  . BPH (benign prostatic hyperplasia) 01/05/2013  . IBS (irritable bowel syndrome) 09/03/2012  . Chronic rhinosinusitis 09/03/2012  . EMPHYSEMA 03/21/2010  . COUGH 03/21/2010   Outpatient Encounter Prescriptions as of 04/28/2013  Medication Sig  . aspirin 81 MG tablet Take 81 mg by mouth daily.    Marland Kitchen azelastine (ASTELIN) 137 MCG/SPRAY nasal spray Place 1 spray into the nose 1 day or 1 dose. Use in each nostril as directed  . cholecalciferol (VITAMIN D) 1000 UNITS tablet Take 1,000 Units by mouth daily.  . Cyanocobalamin (B-12) 1000 MCG CAPS Take 1 capsule by mouth daily.  Marland Kitchen doxazosin (CARDURA) 4 MG tablet Take 4 mg by mouth at bedtime.    . fluticasone (FLONASE) 50 MCG/ACT nasal spray Place 1 spray into the nose daily.  Marland Kitchen guaiFENesin (MUCINEX) 600 MG 12 hr tablet Take 1,200 mg by mouth 2 (two) times daily.  Marland Kitchen latanoprost (XALATAN) 0.005 % ophthalmic solution Place 1 drop into the left eye daily.  . Probiotic Product (ALIGN) 4 MG CAPS Take 1 tablet by mouth daily.  . TUDORZA PRESSAIR 400 MCG/ACT AEPB Take 1 puff by mouth daily.     Review of Systems  Constitutional: Positive for fatigue.  HENT: Positive for congestion and postnasal drip (nasal congestion is clear).   Eyes: Negative.   Respiratory: Positive for cough and shortness of breath.   Cardiovascular: Negative.   Gastrointestinal: Negative.   Endocrine: Negative.   Genitourinary: Negative.     Musculoskeletal: Positive for arthralgias (left hip pain).  Skin: Negative.   Allergic/Immunologic: Negative.   Neurological: Positive for headaches (everyday).  Hematological: Negative.   Psychiatric/Behavioral: Negative.        Objective:   Physical Exam  Nursing note and vitals reviewed. Constitutional: He is oriented to person, place, and time. He appears well-developed and well-nourished. He appears distressed.  HENT:  Head: Normocephalic and atraumatic.  Right Ear: External ear normal.  Left Ear: External ear normal.  Nose: Nose normal.  Mouth/Throat: Oropharynx is clear and moist. No oropharyngeal exudate.  Eyes: Conjunctivae and EOM are normal. Pupils are equal, round, and reactive to light. Right eye exhibits no discharge. Left eye exhibits no discharge. No scleral icterus.  Neck: Normal range of motion. Neck supple. No thyromegaly present.  Some occipital scalp tenderness  Cardiovascular: Normal rate and normal heart sounds.   No murmur heard. Heart was slightly irregular at about 72 per minute  Pulmonary/Chest: Effort normal. No respiratory distress. He has wheezes. He has no rales. He exhibits no tenderness.   Rhonchi and wheezes with coughing, bringing up purulent yellow sputum. With clearing of sputum breath sounds were improved.  Musculoskeletal: Normal range of motion. He exhibits edema. He exhibits no tenderness.  Lymphadenopathy:    He has no cervical adenopathy.  Neurological: He is alert and oriented to person, place, and time.  Skin: Skin is warm and dry. No rash noted.  Psychiatric: He has a normal mood and affect. His behavior is normal. Judgment and thought content normal.  Patient was stressed about his multiple medical issues that are going on   BP 106/68  Pulse 67  Temp(Src) 98.2 F (36.8 C) (Oral)  Ht 5\' 10"  (1.778 m)  Wt 166 lb 6.4 oz (75.479 kg)  BMI 23.88 kg/m2  Patient has a erythematous. sebaceous cyst which upon touching it began to drain  over the thoracic spine. I&D of the cyst was explained to the patient and after using lidocaine and an 11 blade the cyst was drained appropriately and iodoform gauze was inserted as a wick. The patient tolerated the procedure well.  The results of the CBC was reviewed with the patient before he left the office. The white blood cell count was not elevated. The hemoglobin was good at 14.3 and platelet count was adequate.    Assessment & Plan:  1. Cough - DG Chest 2 View; Future - POCT CBC - BMP8+EGFR  2. Persistent headaches - AMB referral to headache clinic  3. Infected sebaceous cyst -Levaquin 500 daily for 7 day  4. COPD exacerbation -Levaquin 500 daily for 7 day  5. Sinusitis, chronic -Levaquin 500 daily for 7 day Meds ordered this encounter  Medications  . levofloxacin (LEVAQUIN) 500 MG tablet    Sig: Take 1 tablet (500 mg total) by mouth daily.    Dispense:  7 tablet    Refill:  0   Patient Instructions  Keep dressing in place over infected cyst Take medication as directed Use inhaler regularly Take Mucinex maximum strength one twice daily with a large glass of water We will arrange for a visit with the headache clinic or neurologist We will also arrange a visit with the orthopedist at the next visit to this office to evaluate the persistent left hip pain Take Tylenol for pain Drink plenty of fluids   Nyra Capes MD

## 2013-04-28 NOTE — Patient Instructions (Addendum)
Keep dressing in place over infected cyst Take medication as directed Use inhaler regularly Take Mucinex maximum strength one twice daily with a large glass of water We will arrange for a visit with the headache clinic or neurologist We will also arrange a visit with the orthopedist at the next visit to this office to evaluate the persistent left hip pain Take Tylenol for pain Drink plenty of fluids Return to clinic in one day to advance the drain from the cyst that was incised and drained

## 2013-04-29 ENCOUNTER — Telehealth: Payer: Self-pay | Admitting: *Deleted

## 2013-04-29 ENCOUNTER — Encounter: Payer: Self-pay | Admitting: Family Medicine

## 2013-04-29 ENCOUNTER — Ambulatory Visit (INDEPENDENT_AMBULATORY_CARE_PROVIDER_SITE_OTHER): Payer: Medicare Other | Admitting: Family Medicine

## 2013-04-29 VITALS — BP 130/71 | HR 80 | Temp 97.7°F | Ht 70.0 in | Wt 166.0 lb

## 2013-04-29 DIAGNOSIS — L089 Local infection of the skin and subcutaneous tissue, unspecified: Secondary | ICD-10-CM

## 2013-04-29 DIAGNOSIS — J329 Chronic sinusitis, unspecified: Secondary | ICD-10-CM | POA: Diagnosis not present

## 2013-04-29 DIAGNOSIS — R05 Cough: Secondary | ICD-10-CM

## 2013-04-29 DIAGNOSIS — M25552 Pain in left hip: Secondary | ICD-10-CM | POA: Insufficient documentation

## 2013-04-29 DIAGNOSIS — G8929 Other chronic pain: Secondary | ICD-10-CM | POA: Insufficient documentation

## 2013-04-29 DIAGNOSIS — J441 Chronic obstructive pulmonary disease with (acute) exacerbation: Secondary | ICD-10-CM

## 2013-04-29 LAB — BMP8+EGFR
BUN: 18 mg/dL (ref 8–27)
CO2: 24 mmol/L (ref 18–29)
Calcium: 9.2 mg/dL (ref 8.6–10.2)
Chloride: 100 mmol/L (ref 97–108)
GFR calc Af Amer: 61 mL/min/{1.73_m2} (ref >59)
GFR calc non Af Amer: 53 mL/min/{1.73_m2} — ABNORMAL LOW (ref >59)
Glucose: 94 mg/dL (ref 65–99)
Potassium: 5.1 mmol/L (ref 3.5–5.2)
Sodium: 140 mmol/L (ref 134–144)

## 2013-04-29 LAB — CBC WITH DIFFERENTIAL
Eos: 4 %
HCT: 42.5 % (ref 37.5–51.0)
Lymphocytes Absolute: 2.1 10*3/uL (ref 0.7–3.1)
MCH: 30.3 pg (ref 26.6–33.0)
MCHC: 34.1 g/dL (ref 31.5–35.7)
MCV: 89 fL (ref 79–97)
Monocytes: 7 %
Neutrophils Absolute: 5.3 10*3/uL (ref 1.4–7.0)
Platelets: 271 10*3/uL (ref 150–379)
RBC: 4.79 x10E6/uL (ref 4.14–5.80)
WBC: 8.2 10*3/uL (ref 3.4–10.8)

## 2013-04-29 NOTE — Patient Instructions (Addendum)
Continue current medications. Continue good therapeutic lifestyle changes which include good diet and exercise. Fall precautions discussed with patient Continue to leave the dressing in place Continue antibiotics And cough medicine Return to clinic in one day to further advance drain [name] wife is to call the orthopedic office and arrange for an appointment with the orthopedic doctor when he is here in 8 days

## 2013-04-29 NOTE — Telephone Encounter (Signed)
Message copied by Dannielle Huh on Wed Apr 29, 2013  9:10 AM ------      Message from: Ernestina Penna      Created: Tue Apr 28, 2013  5:28 PM       As per radiology report ------

## 2013-04-29 NOTE — Progress Notes (Signed)
   Subjective:    Patient ID: Charles Golden, male    DOB: May 19, 1926, 79 y.o.   MRN: [id number]  HPI Patient here today for 1 day recheck on sore/boil. The patient comes in today for followup with his wife. He is still coughing and having congestion. He has had no fever during the night.     Patient Active Problem List   Diagnosis Date Noted  . BPH (benign prostatic hyperplasia) 01/05/2013  . IBS (irritable bowel syndrome) 09/03/2012  . Chronic rhinosinusitis 09/03/2012  . EMPHYSEMA 03/21/2010  . COUGH 03/21/2010   Outpatient Encounter Prescriptions as of 04/29/2013  Medication Sig  . aspirin 81 MG tablet Take 81 mg by mouth daily.    Marland Kitchen azelastine (ASTELIN) 137 MCG/SPRAY nasal spray Place 1 spray into the nose 1 day or 1 dose. Use in each nostril as directed  . cholecalciferol (VITAMIN D) 1000 UNITS tablet Take 1,000 Units by mouth daily.  . Cyanocobalamin (B-12) 1000 MCG CAPS Take 1 capsule by mouth daily.  Marland Kitchen doxazosin (CARDURA) 4 MG tablet Take 4 mg by mouth at bedtime.    . fluticasone (FLONASE) 50 MCG/ACT nasal spray Place 1 spray into the nose daily.  Marland Kitchen guaiFENesin (MUCINEX) 600 MG 12 hr tablet Take 1,200 mg by mouth 2 (two) times daily.  Marland Kitchen latanoprost (XALATAN) 0.005 % ophthalmic solution Place 1 drop into the left eye daily.  Marland Kitchen levofloxacin (LEVAQUIN) 500 MG tablet Take 1 tablet (500 mg total) by mouth daily.  . Probiotic Product (ALIGN) 4 MG CAPS Take 1 tablet by mouth daily.  . TUDORZA PRESSAIR 400 MCG/ACT AEPB Take 1 puff by mouth daily.     Review of Systems  Constitutional: Negative.   HENT: Negative.   Eyes: Negative.   Respiratory: Positive for cough.   Cardiovascular: Negative.   Gastrointestinal: Negative.   Endocrine: Negative.   Genitourinary: Negative.   Musculoskeletal: Negative.   Skin: Negative.        Sore/boil on back  Allergic/Immunologic: Negative.   Neurological: Negative.   Hematological: Negative.   Psychiatric/Behavioral: Negative.       Objective:   Physical Exam BP 130/71  Pulse 80  Temp(Src) 97.7 F (36.5 C) (Oral)  Ht 5\' 10"  (1.778 m)  Wt 166 lb (75.297 kg)  BMI 23.82 kg/m2 The patient was alert active and in good spirits today. Head ears eyes nose and throat were within normal limits on physical exam. There is no congestion or drainage in the throat. He still has bronchial congestion especially apparent with coughing. After coughing the lungs are clear. There are no rales. The drain in the sebaceous cyst that was I and D'd yesterday, was advanced and a dressing was reapplied.        Assessment & Plan:  Infected sebaceous cyst  Left hip pain  Chronic rhinosinusitis  COPD exacerbation  Cough  Patient Instructions  Continue current medications. Continue good therapeutic lifestyle changes which include good diet and exercise. Fall precautions discussed with patient Continue to leave the dressing in place Continue antibiotics And cough medicine Return to clinic in one day to further advance drain [name] wife is to call the orthopedic office and arrange for an appointment with the orthopedic doctor when he is here in 8 days   Nyra Capes MD

## 2013-04-29 NOTE — Telephone Encounter (Signed)
Patient aware of cxr results. 

## 2013-04-30 ENCOUNTER — Encounter: Payer: Self-pay | Admitting: Family Medicine

## 2013-04-30 ENCOUNTER — Ambulatory Visit (INDEPENDENT_AMBULATORY_CARE_PROVIDER_SITE_OTHER): Payer: Medicare Other | Admitting: Family Medicine

## 2013-04-30 VITALS — BP 116/70 | HR 77 | Temp 97.3°F | Ht 70.0 in | Wt 166.0 lb

## 2013-04-30 DIAGNOSIS — J441 Chronic obstructive pulmonary disease with (acute) exacerbation: Secondary | ICD-10-CM

## 2013-04-30 DIAGNOSIS — R51 Headache: Secondary | ICD-10-CM | POA: Diagnosis not present

## 2013-04-30 DIAGNOSIS — J329 Chronic sinusitis, unspecified: Secondary | ICD-10-CM | POA: Diagnosis not present

## 2013-04-30 DIAGNOSIS — J449 Chronic obstructive pulmonary disease, unspecified: Secondary | ICD-10-CM | POA: Insufficient documentation

## 2013-04-30 DIAGNOSIS — L089 Local infection of the skin and subcutaneous tissue, unspecified: Secondary | ICD-10-CM

## 2013-04-30 NOTE — Progress Notes (Signed)
Subjective:    Patient ID: Charles Golden, male    DOB: [birth year], 79 y.o.   MRN: [id number]  HPI Patient here today for wound recheck. Patient returns to clinic today to recheck his sebaceous cyst infection. He also is here to recheck his recent exacerbation of COPD. He is feeling some better but still coughing up purulent sputum. Once again the chest x-ray did not show any pneumonia. And the CBC had a normal white blood cell count.    Patient Active Problem List   Diagnosis Date Noted  . Left hip pain 04/29/2013  . BPH (benign prostatic hyperplasia) 01/05/2013  . IBS (irritable bowel syndrome) 09/03/2012  . Chronic rhinosinusitis 09/03/2012  . EMPHYSEMA 03/21/2010  . COUGH 03/21/2010   Outpatient Encounter Prescriptions as of 04/30/2013  Medication Sig  . aspirin 81 MG tablet Take 81 mg by mouth daily.    Marland Kitchen azelastine (ASTELIN) 137 MCG/SPRAY nasal spray Place 1 spray into the nose 1 day or 1 dose. Use in each nostril as directed  . cholecalciferol (VITAMIN D) 1000 UNITS tablet Take 1,000 Units by mouth daily.  . Cyanocobalamin (B-12) 1000 MCG CAPS Take 1 capsule by mouth daily.  Marland Kitchen doxazosin (CARDURA) 4 MG tablet Take 4 mg by mouth at bedtime.    . fluticasone (FLONASE) 50 MCG/ACT nasal spray Place 1 spray into the nose daily.  Marland Kitchen guaiFENesin (MUCINEX) 600 MG 12 hr tablet Take 1,200 mg by mouth 2 (two) times daily.  Marland Kitchen latanoprost (XALATAN) 0.005 % ophthalmic solution Place 1 drop into the left eye daily.  Marland Kitchen levofloxacin (LEVAQUIN) 500 MG tablet Take 1 tablet (500 mg total) by mouth daily.  . Probiotic Product (ALIGN) 4 MG CAPS Take 1 tablet by mouth daily.  . TUDORZA PRESSAIR 400 MCG/ACT AEPB Take 1 puff by mouth daily.     Review of Systems  Constitutional: Negative.   HENT: Negative.   Eyes: Negative.   Respiratory: Negative.   Cardiovascular: Negative.   Gastrointestinal: Negative.   Endocrine: Negative.   Genitourinary: Negative.   Musculoskeletal: Negative.   Skin:  Positive for wound (recheck packing).  Allergic/Immunologic: Negative.   Neurological: Negative.   Hematological: Negative.   Psychiatric/Behavioral: Negative.        Objective:   Physical Exam  Nursing note and vitals reviewed. Constitutional: He is oriented to person, place, and time. He appears well-developed and well-nourished. No distress.  HENT:  Head: Normocephalic.  Eyes: Conjunctivae are normal. Right eye exhibits no discharge. Left eye exhibits no discharge.  Neck: Normal range of motion. Neck supple.  Cardiovascular: Normal rate and normal heart sounds.   Slightly irregular rhythm  Pulmonary/Chest: Effort normal. No respiratory distress. He has no wheezes. He has no rales.  Patient has rhonchi with coughing  Musculoskeletal: Normal range of motion.  Neurological: He is alert and oriented to person, place, and time.  Skin: Skin is warm and dry.  The dressing was removed from the sebaceous cyst which had been recently opened and drained and the other form Widick came out with a dressing. The wound was irrigated with half peroxide and water and the patient's wife was instructed on how to do this.  Psychiatric: He has a normal mood and affect. His behavior is normal. Judgment and thought content normal.   BP 116/70  Pulse 77  Temp(Src) 97.3 F (36.3 C) (Oral)  Ht 5\' 10"  (1.778 m)  Wt 166 lb (75.297 kg)  BMI 23.82 kg/m2  Assessment & Plan:  1. COPD exacerbation  2. Sinusitis, chronic  3. Persistent headaches  4. Infected sebaceous cyst  5. Chronic rhinosinusitis Patient Instructions  Irrigate wound with peroxide and water mixture a couple times a day. Also let the shower irrigate. Continue antibiotics Continue cough medicine and inhalers Return to clinic to see the orthopedist in one week at 8:00 in the morning Keep appointment with neurologist for evaluation of headaches    Nyra Capes MD

## 2013-04-30 NOTE — Patient Instructions (Signed)
Irrigate wound with peroxide and water mixture a couple times a day. Also let the shower irrigate. Continue antibiotics Continue cough medicine and inhalers Return to clinic to see the orthopedist in one week at 8:00 in the morning Keep appointment with neurologist for evaluation of headaches

## 2013-05-01 ENCOUNTER — Ambulatory Visit (INDEPENDENT_AMBULATORY_CARE_PROVIDER_SITE_OTHER): Payer: Medicare Other | Admitting: Neurology

## 2013-05-01 ENCOUNTER — Encounter: Payer: Self-pay | Admitting: Neurology

## 2013-05-01 VITALS — BP 117/73 | HR 83 | Ht 70.5 in | Wt 166.0 lb

## 2013-05-01 DIAGNOSIS — R519 Headache, unspecified: Secondary | ICD-10-CM | POA: Insufficient documentation

## 2013-05-01 DIAGNOSIS — R51 Headache: Secondary | ICD-10-CM | POA: Diagnosis not present

## 2013-05-01 NOTE — Progress Notes (Signed)
[facility]    Provider:  Dr Hosie Poisson Referring Provider: Ernestina Penna, MD Primary Care Physician:  Rudi Heap, MD  CC:  Headache   HPI:  Charles Golden is a 79 y.o. male here as a referral from Dr. Christell Constant for chronic headache.   Notes headaches started one to two years ago. Feels they have increased in frequency in the past few years. Typically occuring every other day, can last a few hours to all day. Recent headaches have been in maxillary sinus region, but also frontal and temporal region. Described as a dull aching pain. Pain get up to a 8-9/10. No N/V, no photo or phonophobia. Once and awhile will have some periods of blurry vision, will last a few minutes, typically occuring around once a month. No loss of vision. Scheduled to see his eye doctor in January. No jaw pain or fatigue when eating. Denies any history of neck pain, degenerative changes in his neck.  Has been told he has "mild glaucoma" and macular degeneration. Takes Xalatan eye drops, sees eye doctor every 6 months.   Notes having a long history of chronic sinus infection and congestion. Has COPD.    Review of Systems: Out of a complete 14 system review, the patient complains of only the following symptoms, and all other reviewed systems are negative. Positive headache shortness of breath wheezing hearing loss  History   Social History  . Marital Status: Married    Spouse Name: Charles Golden    Number of Children: 3  . Years of Education: 12+   Occupational History  . Retired    Social History Main Topics  . Smoking status: Former Games developer  . Smokeless tobacco: Never Used  . Alcohol Use: No  . Drug Use: No  . Sexual Activity: Not on file   Other Topics Concern  . Not on file   Social History Narrative   Patient lives at home with his wife [name].    Patient has 3 children.    Patient has his BS   Patient is retired.           History reviewed. No pertinent family history.  Past  Medical History  Diagnosis Date  . IBS (irritable bowel syndrome)   . Enlarged prostate   . Sinus congestion   . Sciatic pain   . COPD (chronic obstructive pulmonary disease)   . Glucagonoma     Past Surgical History  Procedure Laterality Date  . Hernia repair    . Hemorrhoid surgery    . Laparotomy N/A 09/04/2012    Procedure: EXPLORATORY LAPAROTOMY;  Surgeon: Fabio Bering, MD;  Location: [facility];  Service: General;  Laterality: N/A;  . Bowel resection N/A 09/04/2012    Procedure: SMALL BOWEL RESECTION;  Surgeon: Fabio Bering, MD;  Location: [facility];  Service: General;  Laterality: N/A;  Anastimosis    Current Outpatient Prescriptions  Medication Sig Dispense Refill  . aspirin 81 MG tablet Take 81 mg by mouth daily.        Marland Kitchen azelastine (ASTELIN) 137 MCG/SPRAY nasal spray Place 1 spray into the nose 1 day or 1 dose. Use in each nostril as directed  30 mL  12  . cholecalciferol (VITAMIN D) 1000 UNITS tablet Take 1,000 Units by mouth daily.      . Cyanocobalamin (B-12) 1000 MCG CAPS Take 1 capsule by mouth daily.      Marland Kitchen doxazosin (CARDURA) 4 MG tablet Take 4 mg by mouth at  bedtime.        . fluticasone (FLONASE) 50 MCG/ACT nasal spray Place 1 spray into the nose daily.  16 g  12  . guaiFENesin (MUCINEX) 600 MG 12 hr tablet Take 1,200 mg by mouth 2 (two) times daily.      Marland Kitchen latanoprost (XALATAN) 0.005 % ophthalmic solution Place 1 drop into the left eye daily.      Marland Kitchen levofloxacin (LEVAQUIN) 500 MG tablet Take 1 tablet (500 mg total) by mouth daily.  7 tablet  0  . Probiotic Product (ALIGN) 4 MG CAPS Take 1 tablet by mouth daily.      . TUDORZA PRESSAIR 400 MCG/ACT AEPB Take 1 puff by mouth daily.        No current facility-administered medications for this visit.    Allergies as of 05/01/2013 - Review Complete 05/01/2013  Allergen Reaction Noted  . Meperidine hcl Other (See Comments) 03/21/2010    Vitals: BP 117/73  Pulse 83  Ht 5' 10.5" (1.791 m)  Wt 166 lb (75.297 kg)   BMI 23.47 kg/m2 Last Weight:  Wt Readings from Last 1 Encounters:  05/01/13 166 lb (75.297 kg)   Last Height:   Ht Readings from Last 1 Encounters:  05/01/13 5' 10.5" (1.791 m)     Physical exam: Exam: Gen: NAD, conversant Eyes: anicteric sclerae, moist conjunctivae HENT: Atraumatic, oropharynx clear, no temporal tenderness to palpation, no crepitus noted in TMJ, no occipital tenderness, mild tenderness and pain produced with percussion over frontal sinus region bilaterally Neck: Trachea midline; supple,  Lungs: CTA, no wheezing, rales, rhonic                          CV: RRR, no MRG Abdomen: Soft, non-tender;  Extremities: No peripheral edema  Skin: Normal temperature, no rash,  Psych: Appropriate affect, pleasant  Neuro: MS: AA&Ox3, appropriately interactive, normal affect   Speech: fluent w/o paraphasic error  Memory: good recent and remote recall  CN: PERRL, EOMI no nystagmus, visual fields full to finger count bilaterally, unable to fully visualize optic disc bilaterally do to pupil size, no ptosis, sensation intact to LT V1-V3 bilat, face symmetric, no weakness, hearing grossly intact, palate elevates symmetrically, shoulder shrug 5/5 bilat,  tongue protrudes midline, no fasiculations noted.  Motor: normal bulk and tone Strength: 5/5  In all extremities  Coord: rapid alternating and point-to-point (FNF, HTS) movements intact.  Reflexes: symmetrical, bilat downgoing toes  Sens: LT intact in all extremities  Gait: posture, stance, stride and arm-swing normal. Tandem gait intact. Able to walk on heels and toes. Romberg absent.   Assessment:  After physical and neurologic examination, review of laboratory studies, imaging, neurophysiology testing and pre-existing records, assessment will be reviewed on the problem list.  Plan:  Treatment plan and additional workup will be reviewed under Problem List.   1)Headache  79 year old gentleman presenting for initial  evaluation of frequent headache. This has been going on for around 2 years has been progressing worse over the past 6 months. Physical exam is unremarkable but based on age of onset will need to rule out structural lesion. Will order MRI of the brain and ESR to check for GCA. With history of chronic sinus infections/congestion would question if this represents a severe sinus headache. Will start patient on Cambia 50mg  prn, instructed to take with food and not use more than 2 times per week. Follow up once workup completed. Follow up with eye doctor for management  of glaucoma and macular degeneration.    Elspeth Cho, DO  [facility] [address] [place], [place] [address]  Phone [phone] Fax [phone]

## 2013-05-01 NOTE — Patient Instructions (Signed)
Overall you are doing fairly well but I do want to suggest a few things today:   We will check a MRI of your brain and lab work up today for a condition called Giant Cell Arteritis  As far as your medications are concerned, I would like to suggest trying a medication called Cambia. It is a 50mg  packet you mix with water. Please limit its use to less than 1-2 times per week for severe headaches. Take it with food.   I would like to see you back as needed. Please call us with any interim questions, concerns, problems, updates or refill requests.   Please also call us for any test results so we can go over those with you on the phone.  My clinical assistant and will answer any of your questions and relay your messages to me and also relay most of my messages to you.   Our phone number is [phone]. We also have an after hours call service for urgent matters and there is a physician on-call for urgent questions. For any emergencies you know to call 911 or go to the nearest emergency room

## 2013-05-05 ENCOUNTER — Ambulatory Visit (INDEPENDENT_AMBULATORY_CARE_PROVIDER_SITE_OTHER): Payer: Medicare Other | Admitting: *Deleted

## 2013-05-05 DIAGNOSIS — Z4800 Encounter for change or removal of nonsurgical wound dressing: Secondary | ICD-10-CM

## 2013-05-05 NOTE — Progress Notes (Signed)
As a side note: Patient continues to have nasal congestion and facial pressure despite completing antibiotics.  He wanted Dr. Christell Constant to be made aware of this in case another antibiotic should be prescribed.

## 2013-05-05 NOTE — Progress Notes (Signed)
Patient returns today for dressing change on abscess to back. He denies pain or drainage. His wife irrigated the area with peroxide yesterday but was barely able to insert the the tip of the irrigation syringe into the wound. No purulent drainage after irrigation.   Area looks much improved. Induration of 2 x 2.5cm with minimal erythema. No warmth, tenderness, or drainage present.  Area cleaned and antibiotic ointment applied. Covered with bandage.   Keep area clean and dry. May try to irrigate again if tip will insert easily.  Call or return to clinic if area becomes painful, red, or swollen. F/u PRN.  Patient and wife stated understanding and agreement to plan.

## 2013-05-05 NOTE — Patient Instructions (Signed)
Keep area clean and dry. May try to irrigate with syringe. Return to clinic as needed.

## 2013-05-08 DIAGNOSIS — M48061 Spinal stenosis, lumbar region without neurogenic claudication: Secondary | ICD-10-CM | POA: Diagnosis not present

## 2013-05-14 ENCOUNTER — Ambulatory Visit
Admission: RE | Admit: 2013-05-14 | Discharge: 2013-05-14 | Disposition: A | Discharge status: Home / Self Care | Payer: Medicare Other | Source: Ambulatory Visit | Attending: Neurology | Admitting: Neurology

## 2013-05-14 DIAGNOSIS — R51 Headache: Secondary | ICD-10-CM | POA: Diagnosis not present

## 2013-05-25 ENCOUNTER — Telehealth: Payer: Self-pay | Admitting: Family Medicine

## 2013-05-25 DIAGNOSIS — J209 Acute bronchitis, unspecified: Secondary | ICD-10-CM

## 2013-05-25 DIAGNOSIS — R197 Diarrhea, unspecified: Secondary | ICD-10-CM

## 2013-05-25 NOTE — Telephone Encounter (Signed)
Patient instructed to come to the office, get CBC and BMP. We will do a referral for his continuing bronchitis and a referral for his persistent loose bowel movement We'll get stool cultures for O&P and C. Difficile He is to take Imodium one 4 times daily over the counter and avoid caffeine milk cheese and ice cream dairy products

## 2013-05-25 NOTE — Telephone Encounter (Signed)
Has had diarrhea for 14 days he also has bronchitis and he said he is very weak can you call something in for him or what do you recommend he do?

## 2013-05-25 NOTE — Telephone Encounter (Signed)
Spoke to wife and she states

## 2013-05-25 NOTE — Addendum Note (Signed)
Addended by: Magdalene River on: 05/25/2013 06:37 PM   Modules accepted: Orders

## 2013-05-25 NOTE — Telephone Encounter (Signed)
Spoke with wife and she stated Dr Christell Constant called them and they will come in am for his labs

## 2013-05-26 ENCOUNTER — Other Ambulatory Visit (INDEPENDENT_AMBULATORY_CARE_PROVIDER_SITE_OTHER): Payer: Medicare Other

## 2013-05-26 DIAGNOSIS — R7989 Other specified abnormal findings of blood chemistry: Secondary | ICD-10-CM

## 2013-05-26 NOTE — Progress Notes (Signed)
Patient came in for labs only.

## 2013-05-27 ENCOUNTER — Other Ambulatory Visit (INDEPENDENT_AMBULATORY_CARE_PROVIDER_SITE_OTHER): Payer: Medicare Other

## 2013-05-27 DIAGNOSIS — R197 Diarrhea, unspecified: Secondary | ICD-10-CM

## 2013-05-27 LAB — BMP8+EGFR
BUN/Creatinine Ratio: 12 (ref 10–22)
BUN: 17 mg/dL (ref 8–27)
CO2: 22 mmol/L (ref 18–29)
Calcium: 9.1 mg/dL (ref 8.6–10.2)
Creatinine, Ser: 1.47 mg/dL — ABNORMAL HIGH (ref 0.76–1.27)
GFR calc Af Amer: 50 mL/min/{1.73_m2} — ABNORMAL LOW (ref >59)

## 2013-05-27 LAB — CBC WITH DIFFERENTIAL
Basophils Absolute: 0 10*3/uL (ref 0.0–0.2)
Eos: 1 %
HCT: 43.8 % (ref 37.5–51.0)
Hemoglobin: 14.5 g/dL (ref 12.6–17.7)
Immature Granulocytes: 0 %
Lymphocytes Absolute: 2 10*3/uL (ref 0.7–3.1)
Lymphs: 28 %
MCV: 91 fL (ref 79–97)
Monocytes: 10 %
Neutrophils Absolute: 4.4 10*3/uL (ref 1.4–7.0)
Neutrophils Relative %: 61 %
RBC: 4.82 x10E6/uL (ref 4.14–5.80)
RDW: 13.8 % (ref 12.3–15.4)
WBC: 7.3 10*3/uL (ref 3.4–10.8)

## 2013-05-27 NOTE — Progress Notes (Signed)
Patient dropped off specimen.

## 2013-05-28 LAB — CLOSTRIDIUM DIFFICILE BY PCR: Toxigenic C. Difficile by PCR: NEGATIVE

## 2013-05-31 LAB — STOOL CULTURE: E coli, Shiga toxin Assay: NEGATIVE

## 2013-06-01 LAB — OVA AND PARASITE EXAMINATION

## 2013-06-03 DIAGNOSIS — R197 Diarrhea, unspecified: Secondary | ICD-10-CM | POA: Diagnosis not present

## 2013-06-08 ENCOUNTER — Encounter: Payer: Self-pay | Admitting: Family Medicine

## 2013-06-08 ENCOUNTER — Telehealth: Payer: Self-pay | Admitting: *Deleted

## 2013-06-09 ENCOUNTER — Telehealth: Payer: Self-pay | Admitting: Family Medicine

## 2013-06-09 DIAGNOSIS — R51 Headache: Principal | ICD-10-CM

## 2013-06-09 DIAGNOSIS — R519 Headache, unspecified: Secondary | ICD-10-CM

## 2013-06-09 NOTE — Telephone Encounter (Signed)
Dr. Alita Chyle

## 2013-06-09 NOTE — Telephone Encounter (Signed)
Please advise 

## 2013-06-09 NOTE — Telephone Encounter (Signed)
I called pt back and relayed the MRI results.   He continues with headaches (behind eyes, top of head, cheek pressure).  Talk of sinus. He does have chronic sinus problems, blowing out mucous.   Did see ENT 2 yrs ago.  He may return there and /or if continues with headaches will call for f/u here.  He is using cambia and excedrin migraine which helps some.  He will call back for f/u appt prn.

## 2013-06-09 NOTE — Telephone Encounter (Signed)
Called patient to discuss continued headache concerns. He will plan to follow up with ENT for further evaluation.

## 2013-06-09 NOTE — Telephone Encounter (Signed)
Pt saw Dr Janann Colonel for headaches Dr Janann Colonel recommends pt to see ENT Needs referral, whoever [facility] recommends Ins won't cover Charles Golden, RX should be changed to Spiriva or Symbicort Pt also wanted you to know that he is scheduled to see Dr Joya Gaskins instead of Dr Melvyn Novas, as you suggested

## 2013-06-10 ENCOUNTER — Telehealth: Payer: Self-pay | Admitting: Family Medicine

## 2013-06-10 ENCOUNTER — Other Ambulatory Visit: Payer: Self-pay | Admitting: *Deleted

## 2013-06-10 DIAGNOSIS — H02839 Dermatochalasis of unspecified eye, unspecified eyelid: Secondary | ICD-10-CM | POA: Diagnosis not present

## 2013-06-10 DIAGNOSIS — H40059 Ocular hypertension, unspecified eye: Secondary | ICD-10-CM | POA: Diagnosis not present

## 2013-06-10 DIAGNOSIS — Z961 Presence of intraocular lens: Secondary | ICD-10-CM | POA: Diagnosis not present

## 2013-06-10 DIAGNOSIS — H353190 Nonexudative age-related macular degeneration, unspecified eye, stage unspecified: Secondary | ICD-10-CM | POA: Diagnosis not present

## 2013-06-10 MED ORDER — TIOTROPIUM BROMIDE MONOHYDRATE 18 MCG IN CAPS: 18.0000 ug | ORAL_CAPSULE | Freq: Every day | RESPIRATORY_TRACT | Status: DC

## 2013-06-10 NOTE — Telephone Encounter (Signed)
Pt aware of ref that will be made.

## 2013-06-10 NOTE — Telephone Encounter (Signed)
Pt aware and med sent in  

## 2013-06-19 DIAGNOSIS — R197 Diarrhea, unspecified: Secondary | ICD-10-CM | POA: Diagnosis not present

## 2013-06-22 ENCOUNTER — Ambulatory Visit (INDEPENDENT_AMBULATORY_CARE_PROVIDER_SITE_OTHER): Payer: Medicare Other | Admitting: Critical Care Medicine

## 2013-06-22 ENCOUNTER — Encounter: Payer: Self-pay | Admitting: Critical Care Medicine

## 2013-06-22 VITALS — BP 116/74 | HR 60 | Temp 97.9°F | Ht 70.0 in | Wt 168.0 lb

## 2013-06-22 DIAGNOSIS — J449 Chronic obstructive pulmonary disease, unspecified: Secondary | ICD-10-CM

## 2013-06-22 DIAGNOSIS — J479 Bronchiectasis, uncomplicated: Secondary | ICD-10-CM

## 2013-06-22 DIAGNOSIS — J471 Bronchiectasis with (acute) exacerbation: Secondary | ICD-10-CM | POA: Insufficient documentation

## 2013-06-22 DIAGNOSIS — J322 Chronic ethmoidal sinusitis: Secondary | ICD-10-CM | POA: Diagnosis not present

## 2013-06-22 MED ORDER — FLUTTER DEVI: Status: DC

## 2013-06-22 MED ORDER — TIOTROPIUM BROMIDE MONOHYDRATE 18 MCG IN CAPS: 18.0000 ug | ORAL_CAPSULE | Freq: Every day | RESPIRATORY_TRACT | Status: DC

## 2013-06-22 NOTE — Patient Instructions (Addendum)
OK to start/Stay on Spiriva Ok to stop [place] Stay on Flonase/fluticasone two puff each nostril daily Stay on Astelin nasal spray Ok to use mucinex as needed Flutter valve was given, use this 2-4 times daily No other medication changes offered Keep ENT evaluation appt Return 4 months

## 2013-06-22 NOTE — Assessment & Plan Note (Signed)
COPD gold stage B. associated chronic bronchiectasis and emphysema. The patient has recurrent tracheobronchitis and sinusitis as well. Reflux is playing a minor role in this patient. Plan OK to start/Stay on Spiriva Ok to stop [place] Stay on Flonase/fluticasone two puff each nostril daily Stay on Astelin nasal spray Ok to use mucinex as needed Flutter valve was given, use this 2-4 times daily No other medication changes offered Keep ENT evaluation appt Return 4 months

## 2013-06-22 NOTE — Progress Notes (Signed)
Subjective:    Patient ID: Charles Golden, male    DOB: 1926/01/07, 80 y.o.   MRN: [id number]  HPI Comments: Issue is recurrent bronchitis.  Full of congestion mucus  Cough This is a recurrent (has this for years, last 6 weeks then clears up) problem. The current episode started more than 1 month ago. The problem has been rapidly improving. The problem occurs constantly (when had mucus was constant). The cough is productive of purulent sputum (mucus was yellow to green). Associated symptoms include headaches, heartburn, nasal congestion, postnasal drip, rhinorrhea, a sore throat and shortness of breath. Pertinent negatives include no chest pain, chills, ear congestion, ear pain, fever, hemoptysis, myalgias, rash, sweats, weight loss or wheezing. Associated symptoms comments: Hx of chronic headaches, MRI was neg with neuro w/u Has pn drip and clears throat alot. The symptoms are aggravated by cold air. Risk factors for lung disease include smoking/tobacco exposure. He has tried ipratropium inhaler and oral steroids (PCP Rx: mucinex, nasal spray steroid, astelin, ABX.) for the symptoms. The treatment provided moderate relief. His past medical history is significant for bronchitis and COPD. There is no history of asthma, bronchiectasis, emphysema, environmental allergies or pneumonia. gets Diarrhea with ABX    Past Medical History  Diagnosis Date  . IBS (irritable bowel syndrome)   . Enlarged prostate   . Sinus congestion   . Sciatic pain   . COPD (chronic obstructive pulmonary disease)   . Glucagonoma      Family History  Problem Relation Age of Onset  . Asthma      cousin  . Heart disease Mother   . Colon cancer Father      History   Social History  . Marital Status: Married    Spouse Name: Charles Golden    Number of Children: 3  . Years of Education: 12+   Occupational History  . Retired    Social History Main Topics  . Smoking status: Former Smoker -- 1.00 packs/day for 30 years    Types: Cigarettes    Quit date: 05/28/2006  . Smokeless tobacco: Never Used  . Alcohol Use: No  . Drug Use: No  . Sexual Activity: Not on file   Other Topics Concern  . Not on file   Social History Narrative   Patient lives at home with [name].    Patient has 3 children.    Patient has his BS   Patient is retired.            Allergies  Allergen Reactions  . Meperidine Hcl Other (See Comments)    Patient can't remember.     Outpatient Prescriptions Prior to Visit  Medication Sig Dispense Refill  . aspirin 81 MG tablet Take 81 mg by mouth daily.        Marland Kitchen azelastine (ASTELIN) 137 MCG/SPRAY nasal spray Place 1 spray into the nose 1 day or 1 dose. Use in each nostril as directed  30 mL  12  . cholecalciferol (VITAMIN D) 1000 UNITS tablet Take 2,000 Units by mouth daily.       . Cyanocobalamin (B-12) 1000 MCG CAPS Take 500 mcg by mouth daily.       Marland Kitchen doxazosin (CARDURA) 4 MG tablet Take 4 mg by mouth at bedtime.        . fluticasone (FLONASE) 50 MCG/ACT nasal spray Place 1 spray into the nose daily.  16 g  12  . guaiFENesin (MUCINEX) 600 MG 12 hr tablet Take 1,200  mg by mouth 2 (two) times daily.      Marland Kitchen latanoprost (XALATAN) 0.005 % ophthalmic solution Place 1 drop into the left eye 2 (two) times daily.       . Probiotic Product (ALIGN) 4 MG CAPS Take 1 tablet by mouth daily.      . TUDORZA PRESSAIR 400 MCG/ACT AEPB Take 1 puff by mouth daily.       Marland Kitchen levofloxacin (LEVAQUIN) 500 MG tablet Take 1 tablet (500 mg total) by mouth daily.  7 tablet  0  . tiotropium (SPIRIVA HANDIHALER) 18 MCG inhalation capsule Place 1 capsule (18 mcg total) into inhaler and inhale daily.  30 capsule  12   No facility-administered medications prior to visit.      Review of Systems  Constitutional: Positive for diaphoresis and fatigue. Negative for fever, chills, weight loss, activity change, appetite change and unexpected weight change.  HENT: Positive for congestion, postnasal drip,  rhinorrhea, sinus pressure, sneezing and sore throat. Negative for dental problem, ear discharge, ear pain, facial swelling, hearing loss, mouth sores, nosebleeds, tinnitus, trouble swallowing and voice change.   Eyes: Negative for photophobia, discharge, itching and visual disturbance.  Respiratory: Positive for cough and shortness of breath. Negative for apnea, hemoptysis, choking, chest tightness, wheezing and stridor.   Cardiovascular: Negative for chest pain, palpitations and leg swelling.  Gastrointestinal: Positive for heartburn. Negative for nausea, vomiting, abdominal pain, constipation, blood in stool and abdominal distention.  Genitourinary: Positive for frequency. Negative for dysuria, urgency, hematuria, flank pain, decreased urine volume and difficulty urinating.  Musculoskeletal: Negative for arthralgias, back pain, gait problem, joint swelling, myalgias, neck pain and neck stiffness.  Skin: Negative for color change, pallor and rash.  Allergic/Immunologic: Negative for environmental allergies.  Neurological: Positive for headaches. Negative for dizziness, tremors, seizures, syncope, speech difficulty, weakness, light-headedness and numbness.  Hematological: Negative for adenopathy. Does not bruise/bleed easily.  Psychiatric/Behavioral: Negative for confusion, sleep disturbance and agitation. The patient is not nervous/anxious.        Objective:   Physical Exam Filed Vitals:   06/22/13 1057  BP: 116/74  Pulse: 60  Temp: 97.9 F (36.6 C)  TempSrc: Oral  Height: 5\' 10"  (1.778 m)  Weight: 168 lb (76.204 kg)  SpO2: 98%    Gen: Pleasant, well-nourished, in no distress,  normal affect  ENT: No lesions,  mouth clear,  oropharynx clear, moderate postnasal drip, no nasal purulence, mild rhinitis  Neck: No JVD, no TMG, no carotid bruits  Lungs: No use of accessory muscles, no dullness to percussion, focal expiratory wheeze right middle lung  Cardiovascular: RRR, heart sounds  normal, no murmur or gallops, no peripheral edema  Abdomen: soft and NT, no HSM,  BS normal  Musculoskeletal: No deformities, no cyanosis or clubbing  Neuro: alert, non focal  Skin: Warm, no lesions or rashes  No results found. Chest x-ray from December 2014 reviewed and showed emphysematous changes and air trapping CT chest from 2004 reviewed and showed bronchiectasis in right middle and left lingular areas MRI of the head was reviewed from December 2014 and showed chronic maxillary sinusitis and generalized atrophy of the cerebellum and cerebrum  Spirometry from 06/22/2013 revealed FEV1 77% predicted FVC 89% predicted FEV1 to FVC ratio 64% of predicted      Assessment & Plan:   Obstructive chronic bronchitis without exacerbation COPD gold stage B. associated chronic bronchiectasis and emphysema. The patient has recurrent tracheobronchitis and sinusitis as well. Reflux is playing a minor role in this  patient. Plan OK to start/Stay on Spiriva Ok to stop [place] Stay on Flonase/fluticasone two puff each nostril daily Stay on Astelin nasal spray Ok to use mucinex as needed Flutter valve was given, use this 2-4 times daily No other medication changes offered Keep ENT evaluation appt Return 4 months    Updated Medication List Outpatient Encounter Prescriptions as of 06/22/2013  Medication Sig  . aspirin 81 MG tablet Take 81 mg by mouth daily.    Marland Kitchen azelastine (ASTELIN) 137 MCG/SPRAY nasal spray Place 1 spray into the nose 1 day or 1 dose. Use in each nostril as directed  . cholecalciferol (VITAMIN D) 1000 UNITS tablet Take 2,000 Units by mouth daily.   . Cyanocobalamin (B-12) 1000 MCG CAPS Take 500 mcg by mouth daily.   Marland Kitchen doxazosin (CARDURA) 4 MG tablet Take 4 mg by mouth at bedtime.    . fluticasone (FLONASE) 50 MCG/ACT nasal spray Place 1 spray into the nose daily.  Marland Kitchen guaiFENesin (MUCINEX) 600 MG 12 hr tablet Take 1,200 mg by mouth 2 (two) times daily.  Marland Kitchen latanoprost  (XALATAN) 0.005 % ophthalmic solution Place 1 drop into the left eye 2 (two) times daily.   Marland Kitchen PREVALITE 4 G packet daily.  . Probiotic Product (ALIGN) 4 MG CAPS Take 1 tablet by mouth daily.  . timolol (BETIMOL) 0.25 % ophthalmic solution Place 1 drop into the left eye daily.  . [DISCONTINUED] TUDORZA PRESSAIR 400 MCG/ACT AEPB Take 1 puff by mouth daily.   Marland Kitchen Respiratory Therapy Supplies (FLUTTER) DEVI Use 2-4 times daily  . tiotropium (SPIRIVA HANDIHALER) 18 MCG inhalation capsule Place 1 capsule (18 mcg total) into inhaler and inhale daily.  . [DISCONTINUED] levofloxacin (LEVAQUIN) 500 MG tablet Take 1 tablet (500 mg total) by mouth daily.  . [DISCONTINUED] tiotropium (SPIRIVA HANDIHALER) 18 MCG inhalation capsule Place 1 capsule (18 mcg total) into inhaler and inhale daily.

## 2013-07-08 DIAGNOSIS — J322 Chronic ethmoidal sinusitis: Secondary | ICD-10-CM | POA: Diagnosis not present

## 2013-07-15 ENCOUNTER — Other Ambulatory Visit: Payer: Self-pay | Admitting: Otolaryngology

## 2013-07-15 ENCOUNTER — Ambulatory Visit
Admission: RE | Admit: 2013-07-15 | Discharge: 2013-07-15 | Disposition: A | Discharge status: Home / Self Care | Payer: Medicare Other | Source: Ambulatory Visit | Attending: Otolaryngology | Admitting: Otolaryngology

## 2013-07-15 DIAGNOSIS — J329 Chronic sinusitis, unspecified: Secondary | ICD-10-CM

## 2013-07-15 DIAGNOSIS — J342 Deviated nasal septum: Secondary | ICD-10-CM | POA: Diagnosis not present

## 2013-07-16 DIAGNOSIS — H40059 Ocular hypertension, unspecified eye: Secondary | ICD-10-CM | POA: Diagnosis not present

## 2013-07-17 DIAGNOSIS — R51 Headache: Secondary | ICD-10-CM | POA: Diagnosis not present

## 2013-07-17 DIAGNOSIS — J342 Deviated nasal septum: Secondary | ICD-10-CM | POA: Diagnosis not present

## 2013-07-27 ENCOUNTER — Telehealth: Payer: Self-pay | Admitting: Family Medicine

## 2013-07-28 NOTE — Telephone Encounter (Signed)
Please see the bone note-------- Dr. Janann Colonel is a neurologist Dr. Radene Journey is an ear  nose and throat specialist Dr. Melton Alar is a neurologist Apparently Dr. Lucia Gaskins feels like the headaches are more neurological than beer nose and throat related. Michela Pitcher he is seeking another opinion from another neurologist. I would follow through on the visit to Dr. Melton Alar.  Make sure that they send me information regarding these visits.Charles Golden

## 2013-07-30 NOTE — Telephone Encounter (Signed)
Patient aware. Patient has decided to wait to see you.

## 2013-08-05 ENCOUNTER — Ambulatory Visit (INDEPENDENT_AMBULATORY_CARE_PROVIDER_SITE_OTHER): Payer: Medicare Other | Admitting: Family Medicine

## 2013-08-05 ENCOUNTER — Encounter: Payer: Self-pay | Admitting: Family Medicine

## 2013-08-05 VITALS — BP 114/69 | HR 72 | Temp 97.8°F | Ht 70.0 in | Wt 169.0 lb

## 2013-08-05 DIAGNOSIS — E559 Vitamin D deficiency, unspecified: Secondary | ICD-10-CM

## 2013-08-05 DIAGNOSIS — K4090 Unilateral inguinal hernia, without obstruction or gangrene, not specified as recurrent: Secondary | ICD-10-CM | POA: Diagnosis not present

## 2013-08-05 DIAGNOSIS — N400 Enlarged prostate without lower urinary tract symptoms: Secondary | ICD-10-CM | POA: Diagnosis not present

## 2013-08-05 DIAGNOSIS — J449 Chronic obstructive pulmonary disease, unspecified: Secondary | ICD-10-CM

## 2013-08-05 DIAGNOSIS — K562 Volvulus: Secondary | ICD-10-CM

## 2013-08-05 DIAGNOSIS — G8929 Other chronic pain: Secondary | ICD-10-CM | POA: Insufficient documentation

## 2013-08-05 DIAGNOSIS — R51 Headache: Secondary | ICD-10-CM

## 2013-08-05 DIAGNOSIS — R519 Headache, unspecified: Secondary | ICD-10-CM | POA: Insufficient documentation

## 2013-08-05 DIAGNOSIS — J4489 Other specified chronic obstructive pulmonary disease: Secondary | ICD-10-CM

## 2013-08-05 DIAGNOSIS — K432 Incisional hernia without obstruction or gangrene: Secondary | ICD-10-CM

## 2013-08-05 DIAGNOSIS — Z0000 Encounter for general adult medical examination without abnormal findings: Secondary | ICD-10-CM

## 2013-08-05 HISTORY — DX: Volvulus: K56.2

## 2013-08-05 HISTORY — DX: Incisional hernia without obstruction or gangrene: K43.2

## 2013-08-05 HISTORY — DX: Other chronic pain: G89.29

## 2013-08-05 NOTE — Patient Instructions (Addendum)
Medicare Annual Wellness Visit  [facility] and the medical providers at [facility] strive to bring you the best medical care.  In doing so we not only want to address your current medical conditions and concerns but also to detect new conditions early and prevent illness, disease and health-related problems.    Medicare offers a yearly Wellness Visit which allows our clinical staff to assess your need for preventative services including immunizations, lifestyle education, counseling to decrease risk of preventable diseases and screening for fall risk and other medical concerns.    This visit is provided free of charge (no copay) for all Medicare recipients. The clinical pharmacists at [facility] have begun to conduct these Wellness Visits which will also include a thorough review of all your medications.    As you primary medical provider recommend that you make an appointment for your Annual Wellness Visit if you have not done so already this year.  You may set up this appointment before you leave today or you may call back ([phone]) and schedule an appointment.  Please make sure when you call that you mention that you are scheduling your Annual Wellness Visit with the clinical pharmacist so that the appointment may be made for the proper length of time.     Continue current medications. Continue good therapeutic lifestyle changes which include good diet and exercise. Fall precautions discussed with patient. If an FOBT was given today- please return it to our front desk. If you are over 54 years old - you may need Prevnar 37 or the adult Pneumonia vaccine.  The patient will call the headache specialist and make an appointment to see you regarding these ongoing headaches.---Dr Melton Alar We will make a referral to Dr. Geroge Baseman who did his previous abdominal surgery for an  evaluation of the incisional hernia and left inguinal  hernia. Continue to drink plenty of fluids and use inhalers both nasal and pulmonary as directed by the pulmonologist. Continue with Mucinex twice daily Patient also needs to reschedule his visit with the urologist, Dr. Jeffie Pollock Return the FOBT Please return and get your fasting lab work done.  The patient will discuss with his pulmonologist about his need for a Prevnar vaccine.

## 2013-08-05 NOTE — Progress Notes (Signed)
Subjective:    Patient ID: Charles Golden, male    DOB: Dec 24, 1925, 80 y.o.   MRN: [id number]  HPI Pt here for follow up and management of chronic medical problems. The patient brings a written history of specialty visits and problems to the visit today this history will be scanned into the record. He is a chronic diarrhea has been resolved doing Prevalite and taking a probiotic. His headaches continue with 2-3 per week and he is supposed to schedule a visit with a second neurologist. He has developed 2 hernias one is an incisional hernia and the other is a left inguinal hernia. The patient will return to clinic for fasting lab work and he also will return in FOBT.       Patient Active Problem List   Diagnosis Date Noted  . Bronchiectasis without acute exacerbation 06/22/2013  . Headache(784.0) 05/01/2013  . Obstructive chronic bronchitis without exacerbation 04/30/2013  . Left hip pain 04/29/2013  . BPH (benign prostatic hyperplasia) 01/05/2013  . IBS (irritable bowel syndrome) 09/03/2012  . Chronic rhinosinusitis 09/03/2012  . EMPHYSEMA 03/21/2010   Outpatient Encounter Prescriptions as of 08/05/2013  Medication Sig  . aspirin 81 MG tablet Take 81 mg by mouth daily.    Marland Kitchen azelastine (ASTELIN) 137 MCG/SPRAY nasal spray Place 1 spray into the nose 1 day or 1 dose. Use in each nostril as directed  . cholecalciferol (VITAMIN D) 1000 UNITS tablet Take 2,000 Units by mouth daily.   . Cyanocobalamin (B-12) 1000 MCG CAPS Take 500 mcg by mouth daily.   Marland Kitchen doxazosin (CARDURA) 4 MG tablet Take 4 mg by mouth at bedtime.    . fluticasone (FLONASE) 50 MCG/ACT nasal spray Place 1 spray into the nose daily.  Marland Kitchen guaiFENesin (MUCINEX) 600 MG 12 hr tablet Take 1,200 mg by mouth 2 (two) times daily.  Marland Kitchen latanoprost (XALATAN) 0.005 % ophthalmic solution Place 1 drop into the left eye 2 (two) times daily.   . Probiotic Product (ALIGN) 4 MG CAPS Take 1 tablet by mouth daily.  Marland Kitchen tiotropium (SPIRIVA  HANDIHALER) 18 MCG inhalation capsule Place 1 capsule (18 mcg total) into inhaler and inhale daily.  . [DISCONTINUED] PREVALITE 4 G packet daily.  . [DISCONTINUED] Respiratory Therapy Supplies (FLUTTER) DEVI Use 2-4 times daily  . [DISCONTINUED] timolol (BETIMOL) 0.25 % ophthalmic solution Place 1 drop into the left eye daily.    Review of Systems  Constitutional: Negative.   HENT: Negative.  Congestion: going to Dr Joya Gaskins for congestion and bronchitis.   Eyes: Negative.   Respiratory: Negative.   Cardiovascular: Negative.   Gastrointestinal: Negative.  Diarrhea: went to dr Earlean Shawl in [place]. hernia noticed.       ?inguinal hernia also  Endocrine: Negative.   Genitourinary: Negative.   Musculoskeletal: Negative.   Skin: Negative.   Allergic/Immunologic: Negative.   Neurological: Negative.  Headaches: went to Dr Jarvis Morgan, then [facility] (MRI and Xray) now going to Dr Melton Alar (Headache specialist)  Hematological: Negative.   Psychiatric/Behavioral: Negative.        Objective:   Physical Exam  Nursing note and vitals reviewed. Constitutional: He is oriented to person, place, and time. He appears well-developed and well-nourished. No distress.  HENT:  Head: Normocephalic and atraumatic.  Right Ear: External ear normal.  Left Ear: External ear normal.  Mouth/Throat: Oropharynx is clear and moist. No oropharyngeal exudate.  Nasal congestion bilaterally  Eyes: Conjunctivae and EOM are normal. Pupils are equal, round, and reactive to light. Right eye exhibits  no discharge. Left eye exhibits no discharge. No scleral icterus.  Neck: Normal range of motion. Neck supple. No thyromegaly present.  No carotid bruits  Cardiovascular: Normal rate, regular rhythm, normal heart sounds and intact distal pulses.   No murmur heard. Pulmonary/Chest: Effort normal and breath sounds normal. No respiratory distress. He has no wheezes. He has no rales. He exhibits no tenderness.  Patient has bilateral rhonchi  with coughing but breath sounds appear better after removing congestion with coughing  Abdominal: Soft. Bowel sounds are normal. He exhibits no mass. There is tenderness. There is no rebound and no guarding.  Patient has a large midline incisional hernia. He also has a fairly large left inguinal hernia. The left inguinal hernia is somewhat tender to palpation and is reducible.  Genitourinary: Penis normal.  Musculoskeletal: Normal range of motion. He exhibits no edema.  Lymphadenopathy:    He has no cervical adenopathy.  Neurological: He is alert and oriented to person, place, and time.  Skin: Skin is warm and dry. No rash noted.  Psychiatric: He has a normal mood and affect. His behavior is normal. Judgment and thought content normal.   BP 114/69  Pulse 72  Temp(Src) 97.8 F (36.6 C) (Oral)  Ht 5' 10"  (1.778 m)  Wt 169 lb (76.658 kg)  BMI 24.25 kg/m2        Assessment & Plan:  1. BPH (benign prostatic hyperplasia) - POCT CBC; Future  2. Obstructive chronic bronchitis without exacerbation - POCT CBC; Future - Hepatic function panel; Future  3. Health care maintenance - POCT CBC; Future - BMP8+EGFR; Future - Hepatic function panel; Future - NMR, lipoprofile; Future  4. Vitamin D deficiency - POCT CBC; Future - Vit D  25 hydroxy (rtn osteoporosis monitoring); Future  5. Small bowel volvulus, history of  6. Incisional hernia, without obstruction or gangrene - Ambulatory referral to General Surgery  7. Left inguinal hernia - Ambulatory referral to General Surgery  8. Chronic headache Patient Instructions                       Medicare Annual Wellness Visit  [facility] and the medical providers at [facility] strive to bring you the best medical care.  In doing so we not only want to address your current medical conditions and concerns but also to detect new conditions early and prevent illness, disease and health-related problems.    Medicare  offers a yearly Wellness Visit which allows our clinical staff to assess your need for preventative services including immunizations, lifestyle education, counseling to decrease risk of preventable diseases and screening for fall risk and other medical concerns.    This visit is provided free of charge (no copay) for all Medicare recipients. The clinical pharmacists at [facility] have begun to conduct these Wellness Visits which will also include a thorough review of all your medications.    As you primary medical provider recommend that you make an appointment for your Annual Wellness Visit if you have not done so already this year.  You may set up this appointment before you leave today or you may call back ([phone]) and schedule an appointment.  Please make sure when you call that you mention that you are scheduling your Annual Wellness Visit with the clinical pharmacist so that the appointment may be made for the proper length of time.     Continue current medications. Continue good therapeutic lifestyle changes which include good  diet and exercise. Fall precautions discussed with patient. If an FOBT was given today- please return it to our front desk. If you are over 66 years old - you may need Prevnar 46 or the adult Pneumonia vaccine.  The patient will call the headache specialist and make an appointment to see you regarding these ongoing headaches.---Dr Melton Alar We will make a referral to Dr. Geroge Baseman who did his previous abdominal surgery for an  evaluation of the incisional hernia and left inguinal hernia. Continue to drink plenty of fluids and use inhalers both nasal and pulmonary as directed by the pulmonologist. Continue with Mucinex twice daily Patient also needs to reschedule his visit with the urologist, Dr. Jeffie Pollock Return the FOBT Please return and get your fasting lab work done.  The patient will discuss with his pulmonologist about his need for a Prevnar  vaccine.   Arrie Senate MD

## 2013-08-07 ENCOUNTER — Other Ambulatory Visit: Payer: Medicare Other

## 2013-08-07 DIAGNOSIS — Z1212 Encounter for screening for malignant neoplasm of rectum: Secondary | ICD-10-CM

## 2013-08-07 NOTE — Progress Notes (Signed)
Pt came in for lab  only 

## 2013-08-08 LAB — FECAL OCCULT BLOOD, IMMUNOCHEMICAL: Fecal Occult Bld: NEGATIVE

## 2013-08-12 ENCOUNTER — Other Ambulatory Visit (INDEPENDENT_AMBULATORY_CARE_PROVIDER_SITE_OTHER): Payer: Medicare Other

## 2013-08-12 DIAGNOSIS — E559 Vitamin D deficiency, unspecified: Secondary | ICD-10-CM | POA: Diagnosis not present

## 2013-08-12 DIAGNOSIS — J449 Chronic obstructive pulmonary disease, unspecified: Secondary | ICD-10-CM

## 2013-08-12 DIAGNOSIS — Z0000 Encounter for general adult medical examination without abnormal findings: Secondary | ICD-10-CM | POA: Diagnosis not present

## 2013-08-12 DIAGNOSIS — N400 Enlarged prostate without lower urinary tract symptoms: Secondary | ICD-10-CM

## 2013-08-12 DIAGNOSIS — E785 Hyperlipidemia, unspecified: Secondary | ICD-10-CM | POA: Diagnosis not present

## 2013-08-12 LAB — POCT CBC
GRANULOCYTE PERCENT: 67.6 % (ref 37–80)
HCT, POC: 43.8 % (ref 43.5–53.7)
HEMOGLOBIN: 14.2 g/dL (ref 14.1–18.1)
Lymph, poc: 2.3 (ref 0.6–3.4)
MCH, POC: 29.7 pg (ref 27–31.2)
MCHC: 32.4 g/dL (ref 31.8–35.4)
MCV: 91.8 fL (ref 80–97)
MPV: 7.4 fL (ref 0–99.8)
POC GRANULOCYTE: 5.2 (ref 2–6.9)
POC LYMPH %: 29.8 % (ref 10–50)
Platelet Count, POC: 240 10*3/uL (ref 142–424)
RBC: 4.8 M/uL (ref 4.69–6.13)
RDW, POC: 15.2 %
WBC: 7.7 10*3/uL (ref 4.6–10.2)

## 2013-08-12 NOTE — Progress Notes (Signed)
Patient came in for labs only.

## 2013-08-13 DIAGNOSIS — Z961 Presence of intraocular lens: Secondary | ICD-10-CM | POA: Diagnosis not present

## 2013-08-13 DIAGNOSIS — H40059 Ocular hypertension, unspecified eye: Secondary | ICD-10-CM | POA: Diagnosis not present

## 2013-08-14 LAB — HEPATIC FUNCTION PANEL
ALT: 9 IU/L (ref 0–44)
AST: 12 IU/L (ref 0–40)
Albumin: 3.9 g/dL (ref 3.5–4.7)
Alkaline Phosphatase: 92 IU/L (ref 39–117)
BILIRUBIN DIRECT: 0.08 mg/dL (ref 0.00–0.40)
Total Bilirubin: 0.2 mg/dL (ref 0.0–1.2)
Total Protein: 6.1 g/dL (ref 6.0–8.5)

## 2013-08-14 LAB — BMP8+EGFR
BUN/Creatinine Ratio: 16 (ref 10–22)
BUN: 21 mg/dL (ref 8–27)
CO2: 23 mmol/L (ref 18–29)
CREATININE: 1.29 mg/dL — AB (ref 0.76–1.27)
Calcium: 9.1 mg/dL (ref 8.6–10.2)
Chloride: 105 mmol/L (ref 97–108)
GFR calc Af Amer: 58 mL/min/{1.73_m2} — ABNORMAL LOW (ref >59)
GFR, EST NON AFRICAN AMERICAN: 50 mL/min/{1.73_m2} — AB (ref >59)
Glucose: 94 mg/dL (ref 65–99)
Potassium: 5.4 mmol/L — ABNORMAL HIGH (ref 3.5–5.2)
Sodium: 143 mmol/L (ref 134–144)

## 2013-08-14 LAB — NMR, LIPOPROFILE
CHOLESTEROL: 151 mg/dL (ref <200)
HDL Cholesterol by NMR: 57 mg/dL (ref >=40)
HDL Particle Number: 34.6 umol/L (ref >=30.5)
LDL PARTICLE NUMBER: 926 nmol/L (ref <1000)
LDL Size: 21 nm (ref >20.5)
LDLC SERPL CALC-MCNC: 81 mg/dL (ref <100)
LP-IR Score: <25 (ref <=45)
SMALL LDL PARTICLE NUMBER: 224 nmol/L (ref <=527)
Triglycerides by NMR: 64 mg/dL (ref <150)

## 2013-08-14 LAB — VITAMIN D 25 HYDROXY (VIT D DEFICIENCY, FRACTURES): Vit D, 25-Hydroxy: 33.6 ng/mL (ref 30.0–100.0)

## 2013-08-19 ENCOUNTER — Encounter: Payer: Self-pay | Admitting: *Deleted

## 2013-08-19 NOTE — Progress Notes (Signed)
Quick Note:  Copy of labs sent to patient ______ 

## 2013-09-01 DIAGNOSIS — K4090 Unilateral inguinal hernia, without obstruction or gangrene, not specified as recurrent: Secondary | ICD-10-CM | POA: Diagnosis not present

## 2013-09-02 ENCOUNTER — Encounter (HOSPITAL_COMMUNITY): Payer: Self-pay | Admitting: Pharmacy Technician

## 2013-09-02 NOTE — H&P (Signed)
  NTS SOAP Note  Vital Signs:  Vitals as of: 01/02/8675: Systolic 720: Diastolic 77: Heart Rate 75: Temp 97.101F: Height 73ft 10in: Weight 173Lbs 0 Ounces: Pain Level 7: BMI 24.82  BMI : 24.82 kg/m2  Subjective: This 80 Years 32 Months old Male presents for of multiple hernias.  Has developed discomfort and swelling in left groin region for the past few months.  Made worse with straining.  Also has a swelling along the top portion of a midline incision from his previous exploratory laparatomy in 2014.  Does not bother him at the current time.  Review of Symptoms:  Constitutional:unremarkable   Head:unremarkable    Eyes:unremarkable   Nose/Mouth/Throat:unremarkable Cardiovascular:  unremarkable   Respiratory:  dyspnea Gastrointestinal:  unremarkable   Genitourinary:    frequency Musculoskeletal:unremarkable   Skin:unremarkable Hematolgic/Lymphatic:unremarkable     Allergic/Immunologic:unremarkable     Past Medical History:    Reviewed  Past Medical History  Surgical History: partial small bowel resection 2014, RIH, hemorrhoidectomy Medical Problems: COPD, BPH Allergies: demerol Medications: spirva, mucinex, baby asa, astelin, align, doxosin, eye drops   Social History:Reviewed  Social History  Preferred Language: English Race:  White Ethnicity: Not Hispanic / Latino Age: 80 Years 11 Months Marital Status:  M Alcohol: no Recreational drug(s): No   Smoking Status: Former smoker reviewed on 09/01/2013 Started Date:  Stopped Date:  Functional Status reviewed on 09/01/2013 ------------------------------------------------ Bathing: Normal Cooking: Normal Dressing: Normal Driving: Normal Eating: Normal Managing Meds: Normal Oral Care: Normal Shopping: Normal Toileting: Normal Transferring: Normal Walking: Normal Cognitive Status reviewed on 09/01/2013 ------------------------------------------------ Attention:  Normal Decision Making: Normal Language: Normal Memory: Normal Motor: Normal Perception: Normal Problem Solving: Normal Visual and Spatial: Normal   Family History:  [place] History Mother, Deceased; Heart disease;  Father     Objective Information: General:  Well appearing, well nourished in no distress. Heart:  RRR, no murmur or gallop.  Normal S1, S2.  No S3, S4.  Lungs:    CTA bilaterally, no wheezes, rhonchi, rales.  Breathing unlabored. Abdomen:Soft, NT/ND, no HSM, no masses.  Has large reducible incisional hernia along superior aspect of a midline incision.  Left inguinal hernia easily reducible. NO:[id number]    Assessment:Left inguinal hernia, symptomatic  Incisional hernia, asymptomatic  Diagnoses: 550.90 Inguinal hernia (Unilateral inguinal hernia, without obstruction or gangrene, not specified as recurrent)  Procedures: 99214 - OFFICE OUTPATIENT VISIT 64 MINUTES    Plan:Scheduled for left inguinal herniorrhaphy with mesh on 09/14/13.   Patient Education:Alternative treatments to surgery were discussed with patient (and family).  Risks and benefits  of procedure including bleeding, infection, mesh use, and the possibility of recurrence of the hernia were fully explained to the patient (and family) who gave informed consent. Patient/family questions were addressed.  Follow-up:Pending Surgery

## 2013-09-07 DIAGNOSIS — G44229 Chronic tension-type headache, not intractable: Secondary | ICD-10-CM | POA: Diagnosis not present

## 2013-09-07 DIAGNOSIS — R51 Headache: Secondary | ICD-10-CM | POA: Diagnosis not present

## 2013-09-07 DIAGNOSIS — G609 Hereditary and idiopathic neuropathy, unspecified: Secondary | ICD-10-CM | POA: Diagnosis not present

## 2013-09-08 NOTE — Patient Instructions (Signed)
Charles Golden  09/08/2013   Your procedure is scheduled on: 09/14/2013  Report to [facility] at [id number].  Call this number if you have problems the morning of surgery: [phone]   Remember:   Do not eat food or drink liquids after midnight.   Take these medicines the morning of surgery with A SIP OF WATER: Cardura, Spiriva Handihaler   Do not wear jewelry, make-up or nail polish.  Do not wear lotions, powders, or perfumes. You may wear deodorant.  Do not shave 48 hours prior to surgery. Men may shave face and neck.  Do not bring valuables to the hospital.  [facility] is not responsible for any belongings or valuables.               Contacts, dentures or bridgework may not be worn into surgery.  Leave suitcase in the car. After surgery it may be brought to your room.  For patients admitted to the hospital, discharge time is determined by your treatment team.               Patients discharged the day of surgery will not be allowed to drive  home.  Name and phone number of your driver: family  Special Instructions: Incentive Spirometry - Practice and bring it with you on the day of surgery. Shower using CHG 2 nights before surgery and the night before surgery.  If you shower the day of surgery use CHG.  Use special wash - you have one bottle of CHG for all showers.  You should use approximately 1/3 of the bottle for each shower.   Please read over the following fact sheets that you were given: Pain Booklet, Coughing and Deep Breathing, Surgical Site Infection Prevention, Anesthesia Post-op Instructions and Care and Recovery After Surgery   Hernia Repair Care After These instructions give you information on caring for yourself after your procedure. Your doctor may also give you more specific instructions. Call your doctor if you have any problems or questions after your procedure. HOME CARE   You may have changes in your poops (bowel movements).  You may have loose or watery poop  (diarrhea).  You may be not able to poop.  Your bowels will slowly get back to normal.  Do not eat any food that makes you sick to your stomach (nauseous). Eat small meals 4 to 6 times a day instead of 3 large ones.  Do not drink pop. It will give you gas.  Do not drink alcohol.  Do not lift anything heavier than 10 pounds. This is about the weight of a gallon of milk.  Do not do anything that makes you very tired for at least 6 weeks.  Do not get your wound wet for 2 days.  You may take a sponge bath during this time.  After 2 days you may take a shower. Gently pat your surgical cut (incision) dry with a towel. Do not rub it.  For men: You may have been given an athletic supporter (scrotal support) before you left the hospital. It holds your scrotum and testicles closer to your body so there is no strain on your wound. Wear the supporter until your doctor tells you that you do not need it anymore. GET HELP RIGHT AWAY IF:  You have watery poop, or cannot poop for more than 3 days.  You feel sick to your stomach or throw up (vomit) more than 2 or 3 times.  You have temperature by  mouth above 102 F (38.9 C).  You see redness or puffiness (swelling) around your wound.  You see yellowish white fluid (pus) coming from your wound.  You see a bulge or bump in your lower belly (abdomen) or near your groin.  You develop a rash, trouble breathing, or any other symptoms from medicines taken. MAKE SURE YOU:  Understand these instructions.  Will watch your condition.  Will get help right away if your are not doing well or get worse. Document Released: 04/26/2008 Document Revised: 08/06/2011 Document Reviewed: 04/26/2008 [facility] Patient Information 2014 [place]. PATIENT INSTRUCTIONS POST-ANESTHESIA  IMMEDIATELY FOLLOWING SURGERY:  Do not drive or operate machinery for the first twenty four hours after surgery.  Do not make any important decisions for twenty four hours  after surgery or while taking narcotic pain medications or sedatives.  If you develop intractable nausea and vomiting or a severe headache please notify your doctor immediately.  FOLLOW-UP:  Please make an appointment with your surgeon as instructed. You do not need to follow up with anesthesia unless specifically instructed to do so.  WOUND CARE INSTRUCTIONS (if applicable):  Keep a dry clean dressing on the anesthesia/puncture wound site if there is drainage.  Once the wound has quit draining you may leave it open to air.  Generally you should leave the bandage intact for twenty four hours unless there is drainage.  If the epidural site drains for more than 36-48 hours please call the anesthesia department.  QUESTIONS?:  Please feel free to call your physician or the hospital operator if you have any questions, and they will be happy to assist you.

## 2013-09-09 ENCOUNTER — Encounter (HOSPITAL_COMMUNITY)
Admission: RE | Admit: 2013-09-09 | Discharge: 2013-09-09 | Disposition: A | Discharge status: Home / Self Care | Payer: Medicare Other | Source: Ambulatory Visit | Attending: General Surgery | Admitting: General Surgery

## 2013-09-09 ENCOUNTER — Encounter (HOSPITAL_COMMUNITY): Payer: Self-pay

## 2013-09-09 DIAGNOSIS — Z01812 Encounter for preprocedural laboratory examination: Secondary | ICD-10-CM | POA: Insufficient documentation

## 2013-09-09 HISTORY — DX: Unspecified hearing loss, unspecified ear: H91.90

## 2013-09-09 LAB — CBC WITH DIFFERENTIAL/PLATELET
BASOS ABS: 0 10*3/uL (ref 0.0–0.1)
Basophils Relative: 0 % (ref 0–1)
EOS ABS: 0.2 10*3/uL (ref 0.0–0.7)
Eosinophils Relative: 3 % (ref 0–5)
HEMATOCRIT: 41.9 % (ref 39.0–52.0)
Hemoglobin: 13.7 g/dL (ref 13.0–17.0)
LYMPHS ABS: 2.2 10*3/uL (ref 0.7–4.0)
Lymphocytes Relative: 30 % (ref 12–46)
MCH: 30.7 pg (ref 26.0–34.0)
MCHC: 32.7 g/dL (ref 30.0–36.0)
MCV: 93.9 fL (ref 78.0–100.0)
Monocytes Absolute: 0.6 10*3/uL (ref 0.1–1.0)
Monocytes Relative: 9 % (ref 3–12)
Neutro Abs: 4.3 10*3/uL (ref 1.7–7.7)
Neutrophils Relative %: 58 % (ref 43–77)
PLATELETS: 247 10*3/uL (ref 150–400)
RBC: 4.46 MIL/uL (ref 4.22–5.81)
RDW: 14.2 % (ref 11.5–15.5)
WBC: 7.4 10*3/uL (ref 4.0–10.5)

## 2013-09-09 LAB — BASIC METABOLIC PANEL
BUN: 20 mg/dL (ref 6–23)
CO2: 27 mEq/L (ref 19–32)
Calcium: 9.2 mg/dL (ref 8.4–10.5)
Chloride: 105 mEq/L (ref 96–112)
Creatinine, Ser: 1.36 mg/dL — ABNORMAL HIGH (ref 0.50–1.35)
GFR calc Af Amer: 53 mL/min — ABNORMAL LOW (ref >90)
GFR calc non Af Amer: 45 mL/min — ABNORMAL LOW (ref >90)
Glucose, Bld: 105 mg/dL — ABNORMAL HIGH (ref 70–99)
Potassium: 5.5 mEq/L — ABNORMAL HIGH (ref 3.7–5.3)
Sodium: 142 mEq/L (ref 137–147)

## 2013-09-10 NOTE — Pre-Procedure Instructions (Signed)
Dr Patsey Berthold  Aware of K+. Wants IStat on arrival am of surgery.

## 2013-09-14 ENCOUNTER — Ambulatory Visit (HOSPITAL_COMMUNITY): Payer: Medicare Other | Admitting: Anesthesiology

## 2013-09-14 ENCOUNTER — Encounter (HOSPITAL_COMMUNITY): Payer: Self-pay | Admitting: *Deleted

## 2013-09-14 ENCOUNTER — Encounter (HOSPITAL_COMMUNITY)
Admission: RE | Disposition: A | Discharge status: Home / Self Care | Payer: Self-pay | Source: Ambulatory Visit | Attending: General Surgery

## 2013-09-14 ENCOUNTER — Ambulatory Visit (HOSPITAL_COMMUNITY)
Admission: RE | Admit: 2013-09-14 | Discharge: 2013-09-14 | Disposition: A | Discharge status: Home / Self Care | Payer: Medicare Other | Source: Ambulatory Visit | Attending: General Surgery | Admitting: General Surgery

## 2013-09-14 ENCOUNTER — Encounter (HOSPITAL_COMMUNITY): Payer: Medicare Other | Admitting: Anesthesiology

## 2013-09-14 DIAGNOSIS — Z79899 Other long term (current) drug therapy: Secondary | ICD-10-CM | POA: Insufficient documentation

## 2013-09-14 DIAGNOSIS — Z7982 Long term (current) use of aspirin: Secondary | ICD-10-CM | POA: Diagnosis not present

## 2013-09-14 DIAGNOSIS — I10 Essential (primary) hypertension: Secondary | ICD-10-CM | POA: Insufficient documentation

## 2013-09-14 DIAGNOSIS — Z87891 Personal history of nicotine dependence: Secondary | ICD-10-CM | POA: Diagnosis not present

## 2013-09-14 DIAGNOSIS — J449 Chronic obstructive pulmonary disease, unspecified: Secondary | ICD-10-CM | POA: Insufficient documentation

## 2013-09-14 DIAGNOSIS — K4090 Unilateral inguinal hernia, without obstruction or gangrene, not specified as recurrent: Secondary | ICD-10-CM | POA: Insufficient documentation

## 2013-09-14 DIAGNOSIS — J4489 Other specified chronic obstructive pulmonary disease: Secondary | ICD-10-CM | POA: Insufficient documentation

## 2013-09-14 HISTORY — PX: INSERTION OF MESH: SHX5868

## 2013-09-14 HISTORY — PX: INGUINAL HERNIA REPAIR: SHX194

## 2013-09-14 LAB — POCT I-STAT 4, (NA,K, GLUC, HGB,HCT)
Glucose, Bld: 99 mg/dL (ref 70–99)
HCT: 42 % (ref 39.0–52.0)
HEMOGLOBIN: 14.3 g/dL (ref 13.0–17.0)
Potassium: 4.6 mEq/L (ref 3.7–5.3)
SODIUM: 142 meq/L (ref 137–147)

## 2013-09-14 SURGERY — REPAIR, HERNIA, INGUINAL, ADULT
Anesthesia: Monitor Anesthesia Care | Site: Groin | Laterality: Left

## 2013-09-14 MED ORDER — ONDANSETRON HCL 4 MG/2ML IJ SOLN
4.0000 mg | Freq: Once | INTRAMUSCULAR | Status: AC
  Administered 2013-09-14: 4 mg via INTRAVENOUS

## 2013-09-14 MED ORDER — CHLORHEXIDINE GLUCONATE 4 % EX LIQD: 1.0000 "application " | Freq: Once | CUTANEOUS | Status: DC

## 2013-09-14 MED ORDER — FENTANYL CITRATE 0.05 MG/ML IJ SOLN
25.0000 ug | INTRAMUSCULAR | Status: AC
  Administered 2013-09-14 (×2): 25 ug via INTRAVENOUS

## 2013-09-14 MED ORDER — LIDOCAINE HCL (PF) 1 % IJ SOLN
INTRAMUSCULAR | Status: AC
  Filled 2013-09-14: qty 5

## 2013-09-14 MED ORDER — LACTATED RINGERS IV SOLN
INTRAVENOUS | Status: DC
  Administered 2013-09-14: 07:00:00 via INTRAVENOUS

## 2013-09-14 MED ORDER — FENTANYL CITRATE 0.05 MG/ML IJ SOLN
INTRAMUSCULAR | Status: AC
  Filled 2013-09-14: qty 2

## 2013-09-14 MED ORDER — LIDOCAINE IN DEXTROSE 5-7.5 % IV SOLN
INTRAVENOUS | Status: DC | PRN
  Administered 2013-09-14: 100 mg via INTRATHECAL

## 2013-09-14 MED ORDER — CEFAZOLIN SODIUM-DEXTROSE 2-3 GM-% IV SOLR
INTRAVENOUS | Status: AC
  Filled 2013-09-14: qty 50

## 2013-09-14 MED ORDER — HYDROCODONE-ACETAMINOPHEN 5-325 MG PO TABS: 1.0000 | ORAL_TABLET | Freq: Four times a day (QID) | ORAL | Status: DC | PRN

## 2013-09-14 MED ORDER — PROPOFOL INFUSION 10 MG/ML OPTIME
INTRAVENOUS | Status: DC | PRN
  Administered 2013-09-14: 100 ug/kg/min via INTRAVENOUS

## 2013-09-14 MED ORDER — EPHEDRINE SULFATE 50 MG/ML IJ SOLN
INTRAMUSCULAR | Status: DC | PRN
  Administered 2013-09-14: 5 mg via INTRAVENOUS

## 2013-09-14 MED ORDER — KETOROLAC TROMETHAMINE 30 MG/ML IJ SOLN
30.0000 mg | Freq: Once | INTRAMUSCULAR | Status: AC
  Administered 2013-09-14: 30 mg via INTRAVENOUS
  Filled 2013-09-14: qty 1

## 2013-09-14 MED ORDER — ENOXAPARIN SODIUM 40 MG/0.4ML ~~LOC~~ SOLN
SUBCUTANEOUS | Status: AC
  Filled 2013-09-14: qty 0.4

## 2013-09-14 MED ORDER — FENTANYL CITRATE 0.05 MG/ML IJ SOLN
INTRAMUSCULAR | Status: DC | PRN
  Administered 2013-09-14: 12.5 ug via INTRATHECAL

## 2013-09-14 MED ORDER — FENTANYL CITRATE 0.05 MG/ML IJ SOLN: 25.0000 ug | INTRAMUSCULAR | Status: DC | PRN

## 2013-09-14 MED ORDER — SODIUM CHLORIDE 0.9 % IR SOLN
Status: DC | PRN
  Administered 2013-09-14: 500 mL

## 2013-09-14 MED ORDER — CEFAZOLIN SODIUM-DEXTROSE 2-3 GM-% IV SOLR
2.0000 g | INTRAVENOUS | Status: AC
Start: 2013-09-14 — End: 2013-09-14
  Administered 2013-09-14: 2 g via INTRAVENOUS

## 2013-09-14 MED ORDER — LIDOCAINE IN DEXTROSE 5-7.5 % IV SOLN
INTRAVENOUS | Status: AC
  Filled 2013-09-14: qty 2

## 2013-09-14 MED ORDER — LIDOCAINE HCL (CARDIAC) 10 MG/ML IV SOLN
INTRAVENOUS | Status: DC | PRN
  Administered 2013-09-14: 10 mg via INTRAVENOUS

## 2013-09-14 MED ORDER — MIDAZOLAM HCL 2 MG/2ML IJ SOLN
1.0000 mg | INTRAMUSCULAR | Status: DC | PRN
  Administered 2013-09-14: 2 mg via INTRAVENOUS

## 2013-09-14 MED ORDER — MIDAZOLAM HCL 2 MG/2ML IJ SOLN
INTRAMUSCULAR | Status: AC
  Filled 2013-09-14: qty 2

## 2013-09-14 MED ORDER — PROPOFOL 10 MG/ML IV BOLUS
INTRAVENOUS | Status: AC
  Filled 2013-09-14: qty 20

## 2013-09-14 MED ORDER — EPHEDRINE SULFATE 50 MG/ML IJ SOLN
INTRAMUSCULAR | Status: AC
  Filled 2013-09-14: qty 1

## 2013-09-14 MED ORDER — FENTANYL CITRATE 0.05 MG/ML IJ SOLN
INTRAMUSCULAR | Status: DC | PRN
  Administered 2013-09-14: 12.5 ug via INTRAVENOUS

## 2013-09-14 MED ORDER — BUPIVACAINE HCL (PF) 0.5 % IJ SOLN
INTRAMUSCULAR | Status: AC
  Filled 2013-09-14: qty 30

## 2013-09-14 MED ORDER — SODIUM CHLORIDE BACTERIOSTATIC 0.9 % IJ SOLN
INTRAMUSCULAR | Status: AC
  Filled 2013-09-14: qty 10

## 2013-09-14 MED ORDER — BUPIVACAINE LIPOSOME 1.3 % IJ SUSP
20.0000 mL | Freq: Once | INTRAMUSCULAR | Status: DC
  Filled 2013-09-14: qty 20

## 2013-09-14 MED ORDER — BUPIVACAINE LIPOSOME 1.3 % IJ SUSP
INTRAMUSCULAR | Status: DC | PRN
  Administered 2013-09-14: 17 mL

## 2013-09-14 MED ORDER — ONDANSETRON HCL 4 MG/2ML IJ SOLN: 4.0000 mg | Freq: Once | INTRAMUSCULAR | Status: DC | PRN

## 2013-09-14 MED ORDER — ONDANSETRON HCL 4 MG/2ML IJ SOLN
INTRAMUSCULAR | Status: AC
Start: 2013-09-14 — End: 2013-09-14
  Filled 2013-09-14: qty 2

## 2013-09-14 SURGICAL SUPPLY — 44 items
BAG HAMPER (MISCELLANEOUS) ×3 IMPLANT
CLOTH BEACON ORANGE TIMEOUT ST (SAFETY) ×3 IMPLANT
COVER LIGHT HANDLE STERIS (MISCELLANEOUS) ×6 IMPLANT
DECANTER SPIKE VIAL GLASS SM (MISCELLANEOUS) ×3 IMPLANT
DERMABOND ADVANCED (GAUZE/BANDAGES/DRESSINGS) ×2
DERMABOND ADVANCED .7 DNX12 (GAUZE/BANDAGES/DRESSINGS) ×1 IMPLANT
DRAIN PENROSE 18X1/2 LTX STRL (DRAIN) ×3 IMPLANT
ELECT REM PT RETURN 9FT ADLT (ELECTROSURGICAL) ×3
ELECTRODE REM PT RTRN 9FT ADLT (ELECTROSURGICAL) ×1 IMPLANT
FORMALIN 10 PREFIL 120ML (MISCELLANEOUS) IMPLANT
GLOVE BIOGEL PI IND STRL 8 (GLOVE) ×1 IMPLANT
GLOVE BIOGEL PI INDICATOR 8 (GLOVE) ×2
GLOVE ECLIPSE 6.5 STRL STRAW (GLOVE) ×3 IMPLANT
GLOVE ECLIPSE 7.0 STRL STRAW (GLOVE) ×9 IMPLANT
GLOVE ECLIPSE 7.5 STRL STRAW (GLOVE) ×3 IMPLANT
GLOVE SS BIOGEL STRL SZ 6.5 (GLOVE) ×1 IMPLANT
GLOVE SUPERSENSE BIOGEL SZ 6.5 (GLOVE) ×2
GOWN STRL REUS W/TWL LRG LVL3 (GOWN DISPOSABLE) ×12 IMPLANT
INST SET MINOR GENERAL (KITS) ×3 IMPLANT
KIT ROOM TURNOVER APOR (KITS) ×3 IMPLANT
MANIFOLD NEPTUNE II (INSTRUMENTS) ×3 IMPLANT
MESH HERNIA 1.6X1.9 PLUG LRG (Mesh General) ×1 IMPLANT
MESH HERNIA PLUG LRG (Mesh General) ×2 IMPLANT
NEEDLE HYPO 18GX1.5 BLUNT FILL (NEEDLE) ×3 IMPLANT
NEEDLE HYPO 25X1 1.5 SAFETY (NEEDLE) ×3 IMPLANT
NS IRRIG 1000ML POUR BTL (IV SOLUTION) ×3 IMPLANT
PACK MINOR (CUSTOM PROCEDURE TRAY) ×3 IMPLANT
PAD ARMBOARD 7.5X6 YLW CONV (MISCELLANEOUS) ×3 IMPLANT
SET BASIN LINEN APH (SET/KITS/TRAYS/PACK) ×3 IMPLANT
SOL PREP PROV IODINE SCRUB 4OZ (MISCELLANEOUS) ×3 IMPLANT
SUT NOVA NAB GS-22 2 2-0 T-19 (SUTURE) ×9 IMPLANT
SUT NOVAFIL NAB HGS22 2-0 30IN (SUTURE) IMPLANT
SUT SILK 3 0 (SUTURE)
SUT SILK 3-0 18XBRD TIE 12 (SUTURE) IMPLANT
SUT VIC AB 2-0 CT1 27 (SUTURE) ×2
SUT VIC AB 2-0 CT1 TAPERPNT 27 (SUTURE) ×1 IMPLANT
SUT VIC AB 3-0 SH 27 (SUTURE) ×2
SUT VIC AB 3-0 SH 27X BRD (SUTURE) ×1 IMPLANT
SUT VIC AB 4-0 PS2 27 (SUTURE) ×3 IMPLANT
SUT VICRYL AB 3 0 TIES (SUTURE) IMPLANT
SYR 20CC LL (SYRINGE) ×3 IMPLANT
SYR 30ML LL (SYRINGE) ×3 IMPLANT
SYR CONTROL 10ML LL (SYRINGE) ×3 IMPLANT
TOWEL OR 17X26 4PK STRL BLUE (TOWEL DISPOSABLE) ×3 IMPLANT

## 2013-09-14 NOTE — Interval H&P Note (Signed)
History and Physical Interval Note:  09/14/2013 7:23 AM  Charles Golden  has presented today for surgery, with the diagnosis of left inguinal hernia  The various methods of treatment have been discussed with the patient and family. After consideration of risks, benefits and other options for treatment, the patient has consented to  Procedure(s): LEFT INGUINAL HERNIORRHAPHY (Left) as a surgical intervention .  [name] history has been reviewed, patient examined, no change in status, stable for surgery.  I have reviewed the patient's chart and labs.  Questions were answered to the patient's satisfaction.     Jamesetta So

## 2013-09-14 NOTE — Anesthesia Procedure Notes (Signed)
Procedure Name: MAC Date/Time: 09/14/2013 7:33 AM Performed by: Vista Deck Pre-anesthesia Checklist: Patient identified, Emergency Drugs available, Suction available, Timeout performed and Patient being monitored Patient Re-evaluated:Patient Re-evaluated prior to inductionOxygen Delivery Method: Non-rebreather mask    Spinal  Patient location during procedure: OR Start time: 09/14/2013 7:36 AM End time: 09/14/2013 7:41 AM Staffing CRNA/Resident: Drucie Opitz S Preanesthetic Checklist Completed: patient identified, site marked, surgical consent, pre-op evaluation, timeout performed, IV checked, risks and benefits discussed and monitors and equipment checked Spinal Block Patient position: right lateral decubitus Prep: Betadine Patient monitoring: heart rate, cardiac monitor, continuous pulse ox and blood pressure Approach: right paramedian Location: L3-4 Injection technique: single-shot Needle Needle type: Spinocan  Needle gauge: 22 G Needle length: 9 cm Assessment Sensory level: T6 Additional Notes Betadine prep x 3 1% lidocaine skin wheal 1 cc Clear CSF pre and post injection   ATTEMPTS:1 TRAY ID: [id number] TRAY EXPIRATION DATE: 2016-09

## 2013-09-14 NOTE — Anesthesia Postprocedure Evaluation (Signed)
Anesthesia Post Note  Patient: Charles Golden  Procedure(s) Performed: Procedure(s) (LRB): LEFT INGUINAL HERNIORRHAPHY (Left) INSERTION OF MESH (Left)  Anesthesia type: Spinal  Patient location: PACU  Post pain: Pain level controlled  Post assessment: Post-op Vital signs reviewed, Patient's Cardiovascular Status Stable, Respiratory Function Stable, Patent Airway, No signs of Nausea or vomiting and Pain level controlled  Last Vitals:  Filed Vitals:   09/14/13 0915  BP: 99/55  Pulse: 56  Temp:   Resp: 10    Post vital signs: Reviewed and stable  Level of consciousness: awake and alert   Complications: No apparent anesthesia complications

## 2013-09-14 NOTE — Anesthesia Preprocedure Evaluation (Signed)
Anesthesia Evaluation  Patient identified by MRN, date of birth, ID band Patient awake    Reviewed: Allergy & Precautions, H&P , NPO status , Patient's Chart, lab work & pertinent test results  Airway Mallampati: I TM Distance: >3 FB Neck ROM: Full    Dental  (+) Partial Upper, Poor Dentition   Pulmonary COPD (emphysema)former smoker,  breath sounds clear to auscultation        Cardiovascular hypertension, Pt. on medications Rhythm:Regular Rate:Normal     Neuro/Psych  Neuromuscular disease    GI/Hepatic negative GI ROS, SBO, possible volvulus   Endo/Other    Renal/GU Renal disease (hydronephrosis, kidnet stones)     Musculoskeletal   Abdominal   Peds  Hematology   Anesthesia Other Findings   Reproductive/Obstetrics                           Anesthesia Physical Anesthesia Plan  ASA: III  Anesthesia Plan: Spinal and MAC   Post-op Pain Management:    Induction: Intravenous  Airway Management Planned: Nasal Cannula  Additional Equipment:   Intra-op Plan:   Post-operative Plan:   Informed Consent: I have reviewed the patients History and Physical, chart, labs and discussed the procedure including the risks, benefits and alternatives for the proposed anesthesia with the patient or authorized representative who has indicated his/her understanding and acceptance.     Plan Discussed with:   Anesthesia Plan Comments:         Anesthesia Quick Evaluation

## 2013-09-14 NOTE — Transfer of Care (Signed)
Immediate Anesthesia Transfer of Care Note  Patient: Charles Golden  Procedure(s) Performed: Procedure(s) (LRB): LEFT INGUINAL HERNIORRHAPHY (Left) INSERTION OF MESH (Left)  Patient Location: PACU  Anesthesia Type: SAB  Level of Consciousness: awake  Airway & Oxygen Therapy: Patient Spontanous Breathing and non-rebreather face mask  Post-op Assessment: Report given to PACU RN, Post -op Vital signs reviewed and stable. SAB Level  T 6  Post vital signs: Reviewed and stable  Complications: No apparent anesthesia complications

## 2013-09-14 NOTE — Discharge Instructions (Signed)
Inguinal Hernia, Adult  Care After Refer to this sheet in the next few weeks. These discharge instructions provide you with general information on caring for yourself after you leave the hospital. Your caregiver may also give you specific instructions. Your treatment has been planned according to the most current medical practices available, but unavoidable complications sometimes occur. If you have any problems or questions after discharge, please call your caregiver. HOME CARE INSTRUCTIONS  Put ice on the operative site.  Put ice in a plastic bag.  Place a towel between your skin and the bag.  Leave the ice on for 15-20 minutes at a time, 03-04 times a day while awake.  Change bandages (dressings) as directed.  Keep the wound dry and clean. The wound may be washed gently with soap and water. Gently blot or dab the wound dry. It is okay to take showers 24 to 48 hours after surgery. Do not take baths, use swimming pools, or use hot tubs for 10 days, or as directed by your caregiver.  Only take over-the-counter or prescription medicines for pain, discomfort, or fever as directed by your caregiver.  Continue your normal diet as directed.  Do not lift anything more than 10 pounds or play contact sports for 3 weeks, or as directed. SEEK MEDICAL CARE IF:  There is redness, swelling, or increasing pain in the wound.  There is fluid (pus) coming from the wound.  There is drainage from a wound lasting longer than 1 day.  You have an oral temperature above 102 F (38.9 C).  You notice a bad smell coming from the wound or dressing.  The wound breaks open after the stitches (sutures) have been removed.  You notice increasing pain in the shoulders (shoulder strap areas).  You develop dizzy episodes or fainting while standing.  You feel sick to your stomach (nauseous) or throw up (vomit). SEEK IMMEDIATE MEDICAL CARE IF:  You develop a rash.  You have difficulty breathing.  You  develop a reaction or have side effects to medicines you were given. MAKE SURE YOU:   Understand these instructions.  Will watch your condition.  Will get help right away if you are not doing well or get worse. Document Released: 06/14/2006 Document Revised: 08/06/2011 Document Reviewed: 04/13/2009 [facility] Patient Information 2014 [place], [place]. Spinal Anesthesia Care After HOME CARE INSTRUCTIONS  Do not drive or operate machinery for at least 24 hours after receiving anesthesia. Make sure someone is available to drive you home.  Do not drink alcohol for at least 24 hours after receiving anesthesia.  Do not make important decisions for 24 hours after having spinal or epidural anesthesia. Your thinking may be unclear.  Have someone stay with you for at least 24 to 48 hours following surgery.  Drink lots of fluids when you get home. If you are an adult, drink eight, 8 ounce glasses of water per day, or as directed.  Keep your post-operative appointments as suggested. SEEK IMMEDIATE MEDICAL CARE IF:   You develop a fever or any temperature over 100.4 F (38 C).  You have a persistent or severe headache.  You develop blurred or double vision.  You develop dizziness, fainting or lightheadedness.  You have weakness, numbness, or tingling in your arms or legs.  You develop a skin rash.  You have difficulty breathing.  You have persistent nausea and vomiting.  You are unable to pass urine. Document Released: 08/04/2003 Document Revised: 08/06/2011 Document Reviewed: 06/17/2007 [employer] Patient Information 2014 [place],  [place]. PATIENT INSTRUCTIONS POST-ANESTHESIA  IMMEDIATELY FOLLOWING SURGERY:  Do not drive or operate machinery for the first twenty four hours after surgery.  Do not make any important decisions for twenty four hours after surgery or while taking narcotic pain medications or sedatives.  If you develop intractable nausea and vomiting or a severe headache  please notify your doctor immediately.  FOLLOW-UP:  Please make an appointment with your surgeon as instructed. You do not need to follow up with anesthesia unless specifically instructed to do so.  WOUND CARE INSTRUCTIONS (if applicable):  Keep a dry clean dressing on the anesthesia/puncture wound site if there is drainage.  Once the wound has quit draining you may leave it open to air.  Generally you should leave the bandage intact for twenty four hours unless there is drainage.  If the epidural site drains for more than 36-48 hours please call the anesthesia department.  QUESTIONS?:  Please feel free to call your physician or the hospital operator if you have any questions, and they will be happy to assist you.

## 2013-09-14 NOTE — Op Note (Signed)
Patient:  Charles Golden  DOB:  08/22/1925  MRN:  [id number]   Preop Diagnosis:  Left inguinal hernia  Postop Diagnosis:  Same  Procedure:  Left inguinal herniorrhaphy with mesh  Surgeon:  Aviva Signs, M.D.  Anes:  Spinal  Indications:  Patient is an 80 year old white male who presents with a symptomatic left inguinal hernia. The risks and benefits of the procedure including bleeding, infection, pain, and the possibility of recurrence of the hernia were fully explained to the patient, who gave informed consent.  Procedure note:  The patient was placed in the supine position after spinal anesthesia was administered. The left groin region was prepped and draped using usual sterile technique with Betadine. Surgical site confirmation was performed.  A left groin incision was made down to the external oblique aponeuroses. The aponeuroses was incised to the external ring. A Penrose drain was placed around the spermatic cord. The vase deferens was no within the spermatic cord. The ilioinguinal nerve was identified and retracted as needed. An indirect hernia sac was found. This was freed away from the spermatic cord up to the peritoneal reflection. 2-0 Novafil pursestring suture was placed at its base and the access hernia sac excised. It was disposed of. A large Perfix mesh plug was then placed into this region and secured to the transversalis fascia using a 2-0 Novafil interrupted suture. An onlay patch was then placed along the floor of inguinal canal and secured superiorly to the conjoined tendon and inferiorly to the shelving edge of Poupart's ligament using 20 Novafil interrupted sutures. The internal ring was recreated using a 2-0 Novafil interrupted suture. The external oblique aponeuroses was reapproximated using a 2-0 Vicryl interrupted suture. The subcutaneous layer was reapproximated using 3-0 Vicryl interrupted suture. The skin was closed using a 4-0 Vicryl subcuticular suture. Exparel was  instilled the surrounding wound. Dermabond was applied.  All tape and needle counts were correct at the end of the procedure. Patient was transferred to PACU in stable condition.    Complications:  None  EBL:  Minimal  Specimen:  None

## 2013-09-16 ENCOUNTER — Encounter (HOSPITAL_COMMUNITY): Payer: Self-pay | Admitting: General Surgery

## 2013-09-28 DIAGNOSIS — N138 Other obstructive and reflux uropathy: Secondary | ICD-10-CM | POA: Diagnosis not present

## 2013-09-28 DIAGNOSIS — N401 Enlarged prostate with lower urinary tract symptoms: Secondary | ICD-10-CM | POA: Diagnosis not present

## 2013-09-28 DIAGNOSIS — Z87442 Personal history of urinary calculi: Secondary | ICD-10-CM | POA: Diagnosis not present

## 2013-09-28 DIAGNOSIS — N139 Obstructive and reflux uropathy, unspecified: Secondary | ICD-10-CM | POA: Diagnosis not present

## 2013-09-28 DIAGNOSIS — R39198 Other difficulties with micturition: Secondary | ICD-10-CM | POA: Diagnosis not present

## 2013-11-06 DIAGNOSIS — G44219 Episodic tension-type headache, not intractable: Secondary | ICD-10-CM | POA: Diagnosis not present

## 2013-11-06 DIAGNOSIS — R51 Headache: Secondary | ICD-10-CM | POA: Diagnosis not present

## 2013-12-08 ENCOUNTER — Encounter: Payer: Self-pay | Admitting: Family Medicine

## 2013-12-08 ENCOUNTER — Ambulatory Visit (INDEPENDENT_AMBULATORY_CARE_PROVIDER_SITE_OTHER): Payer: Medicare Other | Admitting: Family Medicine

## 2013-12-08 VITALS — BP 119/77 | HR 67 | Temp 97.1°F | Ht 70.5 in | Wt 179.0 lb

## 2013-12-08 DIAGNOSIS — N400 Enlarged prostate without lower urinary tract symptoms: Secondary | ICD-10-CM

## 2013-12-08 DIAGNOSIS — J449 Chronic obstructive pulmonary disease, unspecified: Secondary | ICD-10-CM | POA: Diagnosis not present

## 2013-12-08 DIAGNOSIS — K432 Incisional hernia without obstruction or gangrene: Secondary | ICD-10-CM

## 2013-12-08 DIAGNOSIS — L989 Disorder of the skin and subcutaneous tissue, unspecified: Secondary | ICD-10-CM

## 2013-12-08 DIAGNOSIS — L723 Sebaceous cyst: Secondary | ICD-10-CM

## 2013-12-08 DIAGNOSIS — J328 Other chronic sinusitis: Secondary | ICD-10-CM

## 2013-12-08 DIAGNOSIS — Z23 Encounter for immunization: Secondary | ICD-10-CM

## 2013-12-08 DIAGNOSIS — Z0000 Encounter for general adult medical examination without abnormal findings: Secondary | ICD-10-CM

## 2013-12-08 DIAGNOSIS — R51 Headache: Secondary | ICD-10-CM | POA: Diagnosis not present

## 2013-12-08 DIAGNOSIS — E559 Vitamin D deficiency, unspecified: Secondary | ICD-10-CM | POA: Diagnosis not present

## 2013-12-08 DIAGNOSIS — J324 Chronic pansinusitis: Secondary | ICD-10-CM

## 2013-12-08 NOTE — Patient Instructions (Addendum)
Medicare Annual Wellness Visit  [facility] and the medical providers at [facility] strive to bring you the best medical care.  In doing so we not only want to address your current medical conditions and concerns but also to detect new conditions early and prevent illness, disease and health-related problems.    Medicare offers a yearly Wellness Visit which allows our clinical staff to assess your need for preventative services including immunizations, lifestyle education, counseling to decrease risk of preventable diseases and screening for fall risk and other medical concerns.    This visit is provided free of charge (no copay) for all Medicare recipients. The clinical pharmacists at [facility] have begun to conduct these Wellness Visits which will also include a thorough review of all your medications.    As you primary medical provider recommend that you make an appointment for your Annual Wellness Visit if you have not done so already this year.  You may set up this appointment before you leave today or you may call back ([phone]) and schedule an appointment.  Please make sure when you call that you mention that you are scheduling your Annual Wellness Visit with the clinical pharmacist so that the appointment may be made for the proper length of time.     Continue current medications. Continue good therapeutic lifestyle changes which include good diet and exercise. Fall precautions discussed with patient. If an FOBT was given today- please return it to our front desk. If you are over 31 years old - you may need Prevnar 27 or the adult Pneumonia vaccine.  Continue followup visits with the neurologist as planned If you develop any pain with your incisional hernia please get to the emergency room immediately Try increasing the gabapentin to 300 mg 3 times daily--- be sure and let Dr. Melton Alar know about this. Continue to drink  plenty of fluids The Prevnar vaccine that he received today may make your arm sore. We will arrange a visit for you to see a dermatologist regarding the fact that sebaceous cyst on your back Keep followup appointment with the pulmonologist as directed

## 2013-12-08 NOTE — Addendum Note (Signed)
Addended by: Zannie Cove on: 12/08/2013 11:16 AM   Modules accepted: Orders

## 2013-12-08 NOTE — Addendum Note (Signed)
Addended by: Zannie Cove on: 12/08/2013 11:21 AM   Modules accepted: Orders

## 2013-12-08 NOTE — Progress Notes (Signed)
Subjective:    Patient ID: Charles Golden, male    DOB: [birth year], 80 y.o.   MRN: [id number]  HPI Pt here for follow up and management of chronic medical problems. The patient has a history of headaches, chronic rhinosinusitis, COPD, and fatigue . He had a hernia repair in April of this year. He has also developed an incisional hernia from his previous surgery one year ago. He continues to have headaches. He first saw Dr. Janann Colonel a neurologist, who referred him to Dr. Radene Journey an ear nose and throat specialist, who in turn referred him to Dr. Melton Alar another neurologist. He is currently taking gabapentin 300 mg twice daily. He had a recent eye exam and this was within normal limits. He also sees Dr. Earlean Shawl, a gastroenterologist for his diarrhea. He's been doing well with this and he attributes a lot of this to taking a probiotic. He is due to receive a Prevnar injection. He will return to the office for fasting lab work.         Patient Active Problem List   Diagnosis Date Noted  . COPD (chronic obstructive pulmonary disease) 12/08/2013  . Small bowel volvulus 08/05/2013  . Chronic headache 08/05/2013  . Left inguinal hernia 08/05/2013  . Incisional hernia, without obstruction or gangrene 08/05/2013  . Bronchiectasis without acute exacerbation 06/22/2013  . Headache(784.0) 05/01/2013  . Obstructive chronic bronchitis without exacerbation 04/30/2013  . Left hip pain 04/29/2013  . BPH (benign prostatic hyperplasia) 01/05/2013  . IBS (irritable bowel syndrome) 09/03/2012  . Chronic rhinosinusitis 09/03/2012  . EMPHYSEMA 03/21/2010   Outpatient Encounter Prescriptions as of 12/08/2013  Medication Sig  . aspirin 81 MG tablet Take 81 mg by mouth daily.    Marland Kitchen aspirin-acetaminophen-caffeine (EXCEDRIN MIGRAINE) 250-250-65 MG per tablet Take 1 tablet by mouth every 6 (six) hours as needed for headache.  Marland Kitchen azelastine (ASTELIN) 137 MCG/SPRAY nasal spray Place 1 spray into the nose daily. Use  in each nostril as directed  . B Complex Vitamins (B-COMPLEX/B-12 PO) Take 500 mg by mouth daily.  . cholecalciferol (VITAMIN D) 1000 UNITS tablet Take 2,000 Units by mouth daily.  Marland Kitchen doxazosin (CARDURA) 4 MG tablet Take 4 mg by mouth at bedtime.    . fluticasone (FLONASE) 50 MCG/ACT nasal spray Place 1 spray into the nose daily.  Marland Kitchen gabapentin (NEURONTIN) 300 MG capsule Take 600 mg by mouth daily.  Marland Kitchen guaiFENesin (MUCINEX) 600 MG 12 hr tablet Take 600 mg by mouth 2 (two) times daily.   Marland Kitchen latanoprost (XALATAN) 0.005 % ophthalmic solution Place 1 drop into the left eye at bedtime.   . Probiotic Product (ALIGN) 4 MG CAPS Take 1 tablet by mouth daily.  Marland Kitchen tiotropium (SPIRIVA HANDIHALER) 18 MCG inhalation capsule Place 1 capsule (18 mcg total) into inhaler and inhale daily.  . [DISCONTINUED] gabapentin (NEURONTIN) 100 MG capsule Take 100 mg by mouth as needed.  . [DISCONTINUED] HYDROcodone-acetaminophen (NORCO) 5-325 MG per tablet Take 1 tablet by mouth every 6 (six) hours as needed for moderate pain.    Review of Systems  Constitutional: Positive for fatigue (no energy).  HENT: Positive for congestion (increased mucus).   Eyes: Negative.   Respiratory: Negative.   Cardiovascular: Negative.   Gastrointestinal: Negative.        Hernia operation 09/14/13- Dr Arnoldo Morale  Endocrine: Negative.   Genitourinary: Negative.   Musculoskeletal: Negative.   Skin: Negative.   Allergic/Immunologic: Negative.   Neurological: Positive for headaches (goes to Dr Melton Alar).  Hematological:  Negative.   Psychiatric/Behavioral: Negative.        Objective:   Physical Exam  Nursing note and vitals reviewed. Constitutional: He is oriented to person, place, and time. He appears well-developed and well-nourished. No distress.  Pleasant and cooperative and appearing younger than his stated age of 27  HENT:  Head: Normocephalic and atraumatic.  Right Ear: External ear normal.  Left Ear: External ear normal.  Nose:  Nose normal.  Mouth/Throat: Oropharynx is clear and moist. No oropharyngeal exudate.  Bilateral hearing aids  Eyes: Conjunctivae and EOM are normal. Pupils are equal, round, and reactive to light. Right eye exhibits no discharge. Left eye exhibits no discharge. No scleral icterus.  Neck: Normal range of motion. Neck supple. No thyromegaly present.  Cardiovascular: Normal rate, regular rhythm, normal heart sounds and intact distal pulses.   No murmur heard. At 72 per minute  Pulmonary/Chest: Effort normal and breath sounds normal. No respiratory distress. He has no wheezes. He has no rales. He exhibits no tenderness.  No abnormal breath sounds  Abdominal: Soft. Bowel sounds are normal. He exhibits no mass. There is no tenderness. There is no rebound and no guarding.  Large midline incisional hernia  Musculoskeletal: Normal range of motion. He exhibits no edema and no tenderness.  Lymphadenopathy:    He has no cervical adenopathy.  Neurological: He is alert and oriented to person, place, and time. He has normal reflexes. No cranial nerve deficit.  Skin: Skin is warm and dry. No rash noted. No erythema. No pallor.  The patient has an inflamed sebaceous cyst just to the left of his lower thoracic spine  Psychiatric: He has a normal mood and affect. His behavior is normal. Judgment and thought content normal.   BP 119/77  Pulse 67  Temp(Src) 97.1 F (36.2 C) (Oral)  Ht 5' 10.5" (1.791 m)  Wt 179 lb (81.194 kg)  BMI 25.31 kg/m2        Assessment & Plan:  1. BPH (benign prostatic hyperplasia) - POCT CBC; Future  2. Chronic obstructive pulmonary disease, unspecified COPD, unspecified chronic bronchitis type - POCT CBC; Future -Keep followup appointment with Dr. Joya Gaskins  3. Health care maintenance - POCT CBC; Future - BMP8+EGFR; Future - Hepatic function panel; Future - NMR, lipoprofile; Future - Vit D  25 hydroxy (rtn osteoporosis monitoring); Future  4. Vitamin D deficiency -  POCT CBC; Future - Vit D  25 hydroxy (rtn osteoporosis monitoring); Future  5. Headache(784.0) -Increase the gabapentin to 3 times day  6. Sebaceous cyst -Referral to dermatology  7. Chronic pansinusitis  8. Incisional hernia, without obstruction or gangrene -Go to the emergency room if any pain  Meds ordered this encounter  Medications  . B Complex Vitamins (B-COMPLEX/B-12 PO)    Sig: Take 500 mg by mouth daily.  Marland Kitchen gabapentin (NEURONTIN) 300 MG capsule    Sig: Take 600 mg by mouth daily.   Patient Instructions                       Medicare Annual Wellness Visit  [facility] and the medical providers at [facility] strive to bring you the best medical care.  In doing so we not only want to address your current medical conditions and concerns but also to detect new conditions early and prevent illness, disease and health-related problems.    Medicare offers a yearly Wellness Visit which allows our clinical staff to assess your need for preventative services  including immunizations, lifestyle education, counseling to decrease risk of preventable diseases and screening for fall risk and other medical concerns.    This visit is provided free of charge (no copay) for all Medicare recipients. The clinical pharmacists at [facility] have begun to conduct these Wellness Visits which will also include a thorough review of all your medications.    As you primary medical provider recommend that you make an appointment for your Annual Wellness Visit if you have not done so already this year.  You may set up this appointment before you leave today or you may call back ([phone]) and schedule an appointment.  Please make sure when you call that you mention that you are scheduling your Annual Wellness Visit with the clinical pharmacist so that the appointment may be made for the proper length of time.     Continue current medications. Continue good  therapeutic lifestyle changes which include good diet and exercise. Fall precautions discussed with patient. If an FOBT was given today- please return it to our front desk. If you are over 59 years old - you may need Prevnar 51 or the adult Pneumonia vaccine.  Continue followup visits with the neurologist as planned If you develop any pain with your incisional hernia please get to the emergency room immediately Try increasing the gabapentin to 300 mg 3 times daily--- be sure and let Dr. Melton Alar know about this. Continue to drink plenty of fluids The Prevnar vaccine that he received today may make your arm sore. We will arrange a visit for you to see a dermatologist regarding the fact that sebaceous cyst on your back Keep followup appointment with the pulmonologist as directed   Arrie Senate MD

## 2013-12-17 ENCOUNTER — Other Ambulatory Visit (INDEPENDENT_AMBULATORY_CARE_PROVIDER_SITE_OTHER): Payer: Medicare Other

## 2013-12-17 DIAGNOSIS — R5383 Other fatigue: Secondary | ICD-10-CM | POA: Diagnosis not present

## 2013-12-17 DIAGNOSIS — R5381 Other malaise: Secondary | ICD-10-CM

## 2013-12-17 DIAGNOSIS — E7800 Pure hypercholesterolemia, unspecified: Secondary | ICD-10-CM

## 2013-12-17 DIAGNOSIS — E559 Vitamin D deficiency, unspecified: Secondary | ICD-10-CM | POA: Diagnosis not present

## 2013-12-17 LAB — POCT CBC
GRANULOCYTE PERCENT: 57.6 % (ref 37–80)
HCT, POC: 41.2 % — AB (ref 43.5–53.7)
HEMOGLOBIN: 13.4 g/dL — AB (ref 14.1–18.1)
Lymph, poc: 2.4 (ref 0.6–3.4)
MCH, POC: 29.6 pg (ref 27–31.2)
MCHC: 32.5 g/dL (ref 31.8–35.4)
MCV: 90.8 fL (ref 80–97)
MPV: 6.4 fL (ref 0–99.8)
POC GRANULOCYTE: 3.9 (ref 2–6.9)
POC LYMPH PERCENT: 36.2 %L (ref 10–50)
Platelet Count, POC: 270 10*3/uL (ref 142–424)
RBC: 4.5 M/uL — AB (ref 4.69–6.13)
RDW, POC: 14.8 %
WBC: 6.7 10*3/uL (ref 4.6–10.2)

## 2013-12-17 NOTE — Progress Notes (Signed)
Pt came in for lab  only 

## 2013-12-18 ENCOUNTER — Telehealth: Payer: Self-pay | Admitting: Family Medicine

## 2013-12-18 LAB — NMR, LIPOPROFILE
CHOLESTEROL: 143 mg/dL (ref 100–199)
HDL Cholesterol by NMR: 52 mg/dL (ref >39)
HDL PARTICLE NUMBER: 29 umol/L — AB (ref >=30.5)
LDL Particle Number: 786 nmol/L (ref <1000)
LDL Size: 21.6 nm (ref >20.5)
LDLC SERPL CALC-MCNC: 80 mg/dL (ref 0–99)
Small LDL Particle Number: 223 nmol/L (ref <=527)
Triglycerides by NMR: 53 mg/dL (ref 0–149)

## 2013-12-18 LAB — HEPATIC FUNCTION PANEL
ALK PHOS: 96 IU/L (ref 39–117)
ALT: 8 IU/L (ref 0–44)
AST: 13 IU/L (ref 0–40)
Albumin: 3.6 g/dL (ref 3.5–4.7)
Bilirubin, Direct: 0.09 mg/dL (ref 0.00–0.40)
Total Bilirubin: 0.3 mg/dL (ref 0.0–1.2)
Total Protein: 5.7 g/dL — ABNORMAL LOW (ref 6.0–8.5)

## 2013-12-18 LAB — BMP8+EGFR
BUN/Creatinine Ratio: 14 (ref 10–22)
BUN: 21 mg/dL (ref 8–27)
CHLORIDE: 107 mmol/L (ref 97–108)
CO2: 22 mmol/L (ref 18–29)
Calcium: 8.7 mg/dL (ref 8.6–10.2)
Creatinine, Ser: 1.45 mg/dL — ABNORMAL HIGH (ref 0.76–1.27)
GFR, EST AFRICAN AMERICAN: 50 mL/min/{1.73_m2} — AB (ref >59)
GFR, EST NON AFRICAN AMERICAN: 43 mL/min/{1.73_m2} — AB (ref >59)
Glucose: 90 mg/dL (ref 65–99)
Potassium: 4.5 mmol/L (ref 3.5–5.2)
Sodium: 144 mmol/L (ref 134–144)

## 2013-12-18 LAB — VITAMIN D 25 HYDROXY (VIT D DEFICIENCY, FRACTURES): Vit D, 25-Hydroxy: 42 ng/mL (ref 30.0–100.0)

## 2013-12-18 NOTE — Telephone Encounter (Signed)
Message copied by Waverly Ferrari on Fri Dec 18, 2013  2:26 PM ------      Message from: Chipper Herb      Created: Fri Dec 18, 2013  2:00 PM       The blood sugar is good at 90. The creatinine the mesenteric kidney function tests is slightly elevated and consistent with past readings. The electrolytes including potassium are within normal limits.      Liver function tests are within normal limits      Cholesterol numbers with advanced lipid testing are excellent and at goal except the good cholesterol or the HDL particle number is low----continue with aggressive therapeutic lifestyle changes which include diet and exercise      The vitamin D level is within normal limits, continue current treatment ------

## 2013-12-19 ENCOUNTER — Other Ambulatory Visit: Payer: Self-pay | Admitting: Family Medicine

## 2013-12-22 DIAGNOSIS — K5900 Constipation, unspecified: Secondary | ICD-10-CM | POA: Diagnosis not present

## 2014-01-06 DIAGNOSIS — L723 Sebaceous cyst: Secondary | ICD-10-CM | POA: Diagnosis not present

## 2014-01-06 DIAGNOSIS — L821 Other seborrheic keratosis: Secondary | ICD-10-CM | POA: Diagnosis not present

## 2014-01-06 DIAGNOSIS — D235 Other benign neoplasm of skin of trunk: Secondary | ICD-10-CM | POA: Diagnosis not present

## 2014-01-26 ENCOUNTER — Other Ambulatory Visit: Payer: Self-pay | Admitting: Family Medicine

## 2014-02-12 ENCOUNTER — Ambulatory Visit (INDEPENDENT_AMBULATORY_CARE_PROVIDER_SITE_OTHER): Payer: Medicare Other | Admitting: Family Medicine

## 2014-02-12 ENCOUNTER — Encounter: Payer: Self-pay | Admitting: Family Medicine

## 2014-02-12 VITALS — BP 110/68 | HR 77 | Temp 97.5°F | Ht 70.5 in | Wt 178.0 lb

## 2014-02-12 DIAGNOSIS — H40059 Ocular hypertension, unspecified eye: Secondary | ICD-10-CM | POA: Diagnosis not present

## 2014-02-12 DIAGNOSIS — H40019 Open angle with borderline findings, low risk, unspecified eye: Secondary | ICD-10-CM | POA: Diagnosis not present

## 2014-02-12 DIAGNOSIS — R21 Rash and other nonspecific skin eruption: Secondary | ICD-10-CM

## 2014-02-12 DIAGNOSIS — Z961 Presence of intraocular lens: Secondary | ICD-10-CM | POA: Diagnosis not present

## 2014-02-12 MED ORDER — DOXYCYCLINE HYCLATE 100 MG PO TABS: 100.0000 mg | ORAL_TABLET | Freq: Two times a day (BID) | ORAL | Status: DC

## 2014-02-12 NOTE — Progress Notes (Signed)
   Subjective:    Patient ID: LONDELL NOLL, male    DOB: [birth year], 80 y.o.   MRN: [id number]  HPI This 80 y.o. male presents for evaluation of rash on right hip and concerned about being bit by a  tick.   Review of Systems C/o tick bite No chest pain, SOB, HA, dizziness, vision change, N/V, diarrhea, constipation, dysuria, urinary urgency or frequency, myalgias, arthralgias.     Objective:   Physical Exam Vital signs noted  Well developed well nourished male.  HEENT - Head atraumatic Normocephalic                Eyes - PERRLA, Conjuctiva - clear Sclera- Clear EOMI                Ears - EAC's Wnl TM's Wnl Gross Hearing WNL                 Throat - oropharanx wnl Respiratory - Lungs CTA bilateral Cardiac - RRR S1 and S2 without murmur GI - Abdomen soft Nontender and bowel sounds active x 4 Skin - anular rash approx 10 cm diameter with central erythema similar to bullseye pattern       Assessment & Plan:  Rash and nonspecific skin eruption - Plan: doxycycline (VIBRA-TABS) 100 MG tablet, Lyme Ab/Western Blot Reflex, Rocky mtn spotted fvr abs pnl(IgG+IgM)  Follow up prn if rash persists.  Lysbeth Penner FNP

## 2014-02-16 LAB — RMSF, IGG, IFA: RMSF, IGG, IFA: 1:64 {titer} — ABNORMAL HIGH

## 2014-02-16 LAB — LYME AB/WESTERN BLOT REFLEX
LYME DISEASE AB, QUANT, IGM: <0.8 index (ref 0.00–0.79)
Lyme IgG/IgM Ab: <0.91 {ISR} (ref 0.00–0.90)

## 2014-02-16 LAB — ROCKY MTN SPOTTED FVR ABS PNL(IGG+IGM)
RMSF IgG: POSITIVE — AB
RMSF IgM: 0.5 index (ref 0.00–0.89)

## 2014-02-17 ENCOUNTER — Telehealth: Payer: Self-pay | Admitting: Family Medicine

## 2014-02-18 NOTE — Telephone Encounter (Signed)
Patient aware.

## 2014-03-08 ENCOUNTER — Ambulatory Visit (INDEPENDENT_AMBULATORY_CARE_PROVIDER_SITE_OTHER): Payer: Medicare Other

## 2014-03-08 DIAGNOSIS — Z23 Encounter for immunization: Secondary | ICD-10-CM | POA: Diagnosis not present

## 2014-03-23 DIAGNOSIS — G44219 Episodic tension-type headache, not intractable: Secondary | ICD-10-CM | POA: Diagnosis not present

## 2014-03-23 DIAGNOSIS — J310 Chronic rhinitis: Secondary | ICD-10-CM | POA: Diagnosis not present

## 2014-04-09 ENCOUNTER — Ambulatory Visit (INDEPENDENT_AMBULATORY_CARE_PROVIDER_SITE_OTHER): Payer: Medicare Other | Admitting: Family Medicine

## 2014-04-09 ENCOUNTER — Ambulatory Visit (INDEPENDENT_AMBULATORY_CARE_PROVIDER_SITE_OTHER): Payer: Medicare Other

## 2014-04-09 ENCOUNTER — Encounter: Payer: Self-pay | Admitting: Family Medicine

## 2014-04-09 VITALS — BP 100/60 | HR 75 | Temp 98.2°F | Ht 70.5 in | Wt 176.0 lb

## 2014-04-09 DIAGNOSIS — G5791 Unspecified mononeuropathy of right lower limb: Secondary | ICD-10-CM | POA: Diagnosis not present

## 2014-04-09 DIAGNOSIS — E7800 Pure hypercholesterolemia, unspecified: Secondary | ICD-10-CM

## 2014-04-09 DIAGNOSIS — E559 Vitamin D deficiency, unspecified: Secondary | ICD-10-CM

## 2014-04-09 DIAGNOSIS — J449 Chronic obstructive pulmonary disease, unspecified: Secondary | ICD-10-CM | POA: Diagnosis not present

## 2014-04-09 DIAGNOSIS — Z0000 Encounter for general adult medical examination without abnormal findings: Secondary | ICD-10-CM

## 2014-04-09 DIAGNOSIS — K5901 Slow transit constipation: Secondary | ICD-10-CM

## 2014-04-09 DIAGNOSIS — N400 Enlarged prostate without lower urinary tract symptoms: Secondary | ICD-10-CM

## 2014-04-09 DIAGNOSIS — G5793 Unspecified mononeuropathy of bilateral lower limbs: Secondary | ICD-10-CM

## 2014-04-09 DIAGNOSIS — M25562 Pain in left knee: Secondary | ICD-10-CM

## 2014-04-09 DIAGNOSIS — K432 Incisional hernia without obstruction or gangrene: Secondary | ICD-10-CM | POA: Diagnosis not present

## 2014-04-09 DIAGNOSIS — H9193 Unspecified hearing loss, bilateral: Secondary | ICD-10-CM

## 2014-04-09 DIAGNOSIS — G5792 Unspecified mononeuropathy of left lower limb: Secondary | ICD-10-CM

## 2014-04-09 LAB — POCT CBC
Granulocyte percent: 72.1 %G (ref 37–80)
HCT, POC: 40.2 % — AB (ref 43.5–53.7)
Hemoglobin: 13.9 g/dL — AB (ref 14.1–18.1)
LYMPH, POC: 1.8 (ref 0.6–3.4)
MCH: 31.1 pg (ref 27–31.2)
MCHC: 34.7 g/dL (ref 31.8–35.4)
MCV: 89.5 fL (ref 80–97)
MPV: 7 fL (ref 0–99.8)
PLATELET COUNT, POC: 252 10*3/uL (ref 142–424)
POC Granulocyte: 5 (ref 2–6.9)
POC LYMPH PERCENT: 25.3 %L (ref 10–50)
RBC: 4.5 M/uL — AB (ref 4.69–6.13)
RDW, POC: 14.1 %
WBC: 7 10*3/uL (ref 4.6–10.2)

## 2014-04-09 MED ORDER — ACLIDINIUM BROMIDE 400 MCG/ACT IN AEPB: 1.0000 | INHALATION_SPRAY | Freq: Two times a day (BID) | RESPIRATORY_TRACT | Status: DC

## 2014-04-09 NOTE — Patient Instructions (Addendum)
Medicare Annual Wellness Visit  [facility] and the medical providers at [facility] strive to bring you the best medical care.  In doing so we not only want to address your current medical conditions and concerns but also to detect new conditions early and prevent illness, disease and health-related problems.    Medicare offers a yearly Wellness Visit which allows our clinical staff to assess your need for preventative services including immunizations, lifestyle education, counseling to decrease risk of preventable diseases and screening for fall risk and other medical concerns.    This visit is provided free of charge (no copay) for all Medicare recipients. The clinical pharmacists at [facility] have begun to conduct these Wellness Visits which will also include a thorough review of all your medications.    As you primary medical provider recommend that you make an appointment for your Annual Wellness Visit if you have not done so already this year.  You may set up this appointment before you leave today or you may call back ([phone]) and schedule an appointment.  Please make sure when you call that you mention that you are scheduling your Annual Wellness Visit with the clinical pharmacist so that the appointment may be made for the proper length of time.     Continue current medications. Continue good therapeutic lifestyle changes which include good diet and exercise. Fall precautions discussed with patient. If an FOBT was given today- please return it to our front desk. If you are over 16 years old - you may need Prevnar 61 or the adult Pneumonia vaccine.  Flu Shots will be available at our office starting mid- September. Please call and schedule a FLU CLINIC APPOINTMENT.   We will arrange an appointment for you to see Dr. Nelva Bush regarding your peripheral neuropathy and back problems that are causing this. We will also  arrange for you to see Dr. Ricki Rodriguez for the left knee pain and degenerative changes that are present We will arrange for you to see Dr. Geroge Baseman regarding the ventral hernia secondary to previous surgery Hold the spiriva and try tudorza--and see if the constipation resolves.  The tudorza Directions are one puff twice daily

## 2014-04-09 NOTE — Progress Notes (Signed)
Subjective:    Patient ID: Charles Golden, male    DOB: 1925-10-16, 80 y.o.   MRN: [id number]  HPI Pt here for follow up and management of chronic medical problems. The patient complains of left knee pain and tingling in his lower legs.His breathing has been better recently.He is concerned about his ventral hernia that is secondary to his abdominal surgery.        Patient Active Problem List   Diagnosis Date Noted  . COPD (chronic obstructive pulmonary disease) 12/08/2013  . Small bowel volvulus 08/05/2013  . Chronic headache 08/05/2013  . Left inguinal hernia 08/05/2013  . Incisional hernia, without obstruction or gangrene 08/05/2013  . Bronchiectasis without acute exacerbation 06/22/2013  . Headache(784.0) 05/01/2013  . Obstructive chronic bronchitis without exacerbation 04/30/2013  . Left hip pain 04/29/2013  . BPH (benign prostatic hyperplasia) 01/05/2013  . IBS (irritable bowel syndrome) 09/03/2012  . Chronic rhinosinusitis 09/03/2012  . EMPHYSEMA 03/21/2010   Outpatient Encounter Prescriptions as of 04/09/2014  Medication Sig  . aspirin 81 MG tablet Take 81 mg by mouth daily.    Marland Kitchen aspirin-acetaminophen-caffeine (EXCEDRIN MIGRAINE) 250-250-65 MG per tablet Take 1 tablet by mouth every 6 (six) hours as needed for headache.  Marland Kitchen azelastine (ASTELIN) 0.1 % nasal spray PLACE 1 SPRAY INTO THE NOSE 1 DAY OR 1 DOSE. USE IN EACH NOSTRIL AS DIRECTED  . B Complex Vitamins (B-COMPLEX/B-12 PO) Take 500 mg by mouth daily.  . cholecalciferol (VITAMIN D) 1000 UNITS tablet Take 2,000 Units by mouth daily.  Marland Kitchen doxazosin (CARDURA) 4 MG tablet Take 4 mg by mouth at bedtime.    . fluticasone (FLONASE) 50 MCG/ACT nasal spray PLACE 1 SPRAY INTO THE NOSE DAILY.  Marland Kitchen gabapentin (NEURONTIN) 300 MG capsule Take 600 mg by mouth daily.  Marland Kitchen guaiFENesin (MUCINEX) 600 MG 12 hr tablet Take 600 mg by mouth 2 (two) times daily.   Marland Kitchen latanoprost (XALATAN) 0.005 % ophthalmic solution Place 1 drop into the left  eye at bedtime.   . Probiotic Product (ALIGN) 4 MG CAPS Take 1 tablet by mouth daily.  Marland Kitchen tiotropium (SPIRIVA HANDIHALER) 18 MCG inhalation capsule Place 1 capsule (18 mcg total) into inhaler and inhale daily.  . [DISCONTINUED] azelastine (ASTELIN) 137 MCG/SPRAY nasal spray Place 1 spray into the nose daily. Use in each nostril as directed  . [DISCONTINUED] doxycycline (VIBRA-TABS) 100 MG tablet Take 1 tablet (100 mg total) by mouth 2 (two) times daily.  . [DISCONTINUED] fluticasone (FLONASE) 50 MCG/ACT nasal spray Place 1 spray into the nose daily.    Review of Systems  Constitutional: Negative.   HENT: Negative.   Eyes: Negative.   Respiratory: Negative.   Cardiovascular: Negative.   Gastrointestinal: Negative.   Endocrine: Negative.   Genitourinary: Negative.   Musculoskeletal: Positive for arthralgias (left knee pain).  Skin: Negative.   Allergic/Immunologic: Negative.   Neurological: Negative.        Bilateral lower leg tingeing   Hematological: Negative.   Psychiatric/Behavioral: Negative.        Objective:   Physical Exam  Constitutional: He is oriented to person, place, and time. He appears well-developed and well-nourished. No distress.  He is doing well and is alert for his age of 19 years  HENT:  Head: Normocephalic and atraumatic.  Right Ear: External ear normal.  Left Ear: External ear normal.  Nose: Nose normal.  Mouth/Throat: Oropharynx is clear and moist. No oropharyngeal exudate.  Eyes: Conjunctivae and EOM are normal. Pupils are equal, round,  and reactive to light. Right eye exhibits no discharge. Left eye exhibits no discharge. No scleral icterus.  Neck: Normal range of motion. Neck supple. No thyromegaly present.  Cardiovascular: Normal rate, regular rhythm, normal heart sounds and intact distal pulses.   No murmur heard. Pulmonary/Chest: Effort normal and breath sounds normal. No respiratory distress. He has no wheezes. He has no rales.  Minimal  congestion.No axillary adenopathy.The lungs are unusually clear anteriorly and posteriorly for this patient  Abdominal: Soft. Bowel sounds are normal. He exhibits no mass. There is no tenderness. There is no rebound and no guarding.  The patient has a large ventral incisional hernia. There is otherwise no abdominal tenderness.  Musculoskeletal: Normal range of motion. He exhibits tenderness. He exhibits no edema.  Good mobility of both knees.There is medial joint line tenderness on the left knee.  Lymphadenopathy:    He has no cervical adenopathy.  Neurological: He is alert and oriented to person, place, and time. He has normal reflexes.  Skin: Skin is warm and dry. No rash noted. No erythema. No pallor.  Psychiatric: He has a normal mood and affect. His behavior is normal. Judgment and thought content normal.  Nursing note and vitals reviewed.  BP 100/60 mmHg  Pulse 75  Temp(Src) 98.2 F (36.8 C) (Oral)  Ht 5' 10.5" (1.791 m)  Wt 176 lb (79.833 kg)  BMI 24.89 kg/m2        Assessment & Plan:  1. Vitamin D deficiency - POCT CBC - Vit D  25 hydroxy (rtn osteoporosis monitoring)  2. BPH (benign prostatic hyperplasia) - POCT CBC  3. Chronic obstructive pulmonary disease, unspecified COPD, unspecified chronic bronchitis type - POCT CBC  4. Elevated LDL cholesterol level - POCT CBC - BMP8+EGFR - Hepatic function panel - Lipid panel  5. Left knee pain - POCT CBC - DG Knee 1-2 Views Left; Future - Ambulatory referral to Orthopedic Surgery  6. Health care maintenance - POCT CBC - BMP8+EGFR - Hepatic function panel - Lipid panel - Vit D  25 hydroxy (rtn osteoporosis monitoring) - DG Knee 1-2 Views Left; Future  7. Neuropathy involving both lower extremities - Ambulatory referral to Orthopedic Surgery  8. Incisional hernia, without obstruction or gangrene - Ambulatory referral to General Surgery  9. Slow transit constipation  10. Hearing impairment,  bilateral   Meds ordered this encounter  Medications  . Aclidinium Bromide (TUDORZA PRESSAIR) 400 MCG/ACT AEPB    Sig: Inhale 1 puff into the lungs 2 (two) times daily.    Dispense:  1 each    Refill:  2      Patient Instructions                       Medicare Annual Wellness Visit  [facility] and the medical providers at [facility] strive to bring you the best medical care.  In doing so we not only want to address your current medical conditions and concerns but also to detect new conditions early and prevent illness, disease and health-related problems.    Medicare offers a yearly Wellness Visit which allows our clinical staff to assess your need for preventative services including immunizations, lifestyle education, counseling to decrease risk of preventable diseases and screening for fall risk and other medical concerns.    This visit is provided free of charge (no copay) for all Medicare recipients. The clinical pharmacists at [facility] have begun to conduct these Wellness Visits which  will also include a thorough review of all your medications.    As you primary medical provider recommend that you make an appointment for your Annual Wellness Visit if you have not done so already this year.  You may set up this appointment before you leave today or you may call back ([phone]) and schedule an appointment.  Please make sure when you call that you mention that you are scheduling your Annual Wellness Visit with the clinical pharmacist so that the appointment may be made for the proper length of time.     Continue current medications. Continue good therapeutic lifestyle changes which include good diet and exercise. Fall precautions discussed with patient. If an FOBT was given today- please return it to our front desk. If you are over 25 years old - you may need Prevnar 44 or the adult Pneumonia vaccine.  Flu Shots will be available at  our office starting mid- September. Please call and schedule a FLU CLINIC APPOINTMENT.   We will arrange an appointment for you to see Dr. Nelva Bush regarding your peripheral neuropathy and back problems that are causing this. We will also arrange for you to see Dr. Ricki Rodriguez for the left knee pain and degenerative changes that are present We will arrange for you to see Dr. Geroge Baseman regarding the ventral hernia secondary to previous surgery Hold the spiriva and try tudorza--and see if the constipation resolves.  The tudorza Directions are one puff twice daily    Arrie Senate MD

## 2014-04-10 LAB — HEPATIC FUNCTION PANEL
ALT: 9 IU/L (ref 0–44)
AST: 13 IU/L (ref 0–40)
Albumin: 3.8 g/dL (ref 3.5–4.7)
Alkaline Phosphatase: 91 IU/L (ref 39–117)
BILIRUBIN DIRECT: 0.1 mg/dL (ref 0.00–0.40)
BILIRUBIN TOTAL: 0.3 mg/dL (ref 0.0–1.2)
Total Protein: 6.1 g/dL (ref 6.0–8.5)

## 2014-04-10 LAB — BMP8+EGFR
BUN / CREAT RATIO: 14 (ref 10–22)
BUN: 23 mg/dL (ref 8–27)
CO2: 23 mmol/L (ref 18–29)
Calcium: 9.5 mg/dL (ref 8.6–10.2)
Chloride: 103 mmol/L (ref 97–108)
Creatinine, Ser: 1.61 mg/dL — ABNORMAL HIGH (ref 0.76–1.27)
GFR calc Af Amer: 44 mL/min/{1.73_m2} — ABNORMAL LOW (ref >59)
GFR, EST NON AFRICAN AMERICAN: 38 mL/min/{1.73_m2} — AB (ref >59)
Glucose: 95 mg/dL (ref 65–99)
POTASSIUM: 4.6 mmol/L (ref 3.5–5.2)
Sodium: 140 mmol/L (ref 134–144)

## 2014-04-10 LAB — LIPID PANEL
Chol/HDL Ratio: 2.9 ratio units (ref 0.0–5.0)
Cholesterol, Total: 161 mg/dL (ref 100–199)
HDL: 56 mg/dL (ref >39)
LDL Calculated: 92 mg/dL (ref 0–99)
Triglycerides: 65 mg/dL (ref 0–149)
VLDL Cholesterol Cal: 13 mg/dL (ref 5–40)

## 2014-04-10 LAB — VITAMIN D 25 HYDROXY (VIT D DEFICIENCY, FRACTURES): VIT D 25 HYDROXY: 48.2 ng/mL (ref 30.0–100.0)

## 2014-04-19 ENCOUNTER — Encounter: Payer: Self-pay | Admitting: Adult Health

## 2014-04-19 ENCOUNTER — Ambulatory Visit (INDEPENDENT_AMBULATORY_CARE_PROVIDER_SITE_OTHER): Payer: Medicare Other | Admitting: Adult Health

## 2014-04-19 ENCOUNTER — Ambulatory Visit (INDEPENDENT_AMBULATORY_CARE_PROVIDER_SITE_OTHER)
Admission: RE | Admit: 2014-04-19 | Discharge: 2014-04-19 | Disposition: A | Discharge status: Home / Self Care | Payer: Medicare Other | Source: Ambulatory Visit | Attending: Adult Health | Admitting: Adult Health

## 2014-04-19 VITALS — BP 112/70 | HR 77 | Temp 96.9°F | Ht 70.0 in | Wt 179.6 lb

## 2014-04-19 DIAGNOSIS — R0989 Other specified symptoms and signs involving the circulatory and respiratory systems: Secondary | ICD-10-CM | POA: Diagnosis not present

## 2014-04-19 DIAGNOSIS — J449 Chronic obstructive pulmonary disease, unspecified: Secondary | ICD-10-CM | POA: Diagnosis not present

## 2014-04-19 DIAGNOSIS — R05 Cough: Secondary | ICD-10-CM | POA: Diagnosis not present

## 2014-04-19 MED ORDER — AMOXICILLIN-POT CLAVULANATE 875-125 MG PO TABS: 1.0000 | ORAL_TABLET | Freq: Two times a day (BID) | ORAL | Status: AC

## 2014-04-19 MED ORDER — PREDNISONE 10 MG PO TABS: ORAL_TABLET | ORAL | Status: DC

## 2014-04-19 MED ORDER — LEVALBUTEROL HCL 0.63 MG/3ML IN NEBU
0.6300 mg | INHALATION_SOLUTION | Freq: Once | RESPIRATORY_TRACT | Status: AC
  Administered 2014-04-19: 0.63 mg via RESPIRATORY_TRACT

## 2014-04-19 NOTE — Assessment & Plan Note (Signed)
Exacerbation  Check cxr   Plan  Augmentin 875 mg twice daily for 7 days, take with food Mucinex DM twice daily as needed for cough and congestion.  Take a prednisone taper over the next week. Fluids and rest Tylenol as needed. Follow-up with Dr. Joya Gaskins as planned and as needed.

## 2014-04-19 NOTE — Addendum Note (Signed)
Addended by: Parke Poisson E on: 04/19/2014 05:07 PM   Modules accepted: Orders

## 2014-04-19 NOTE — Progress Notes (Signed)
   Subjective:    Patient ID: Charles Golden, male    DOB: 10-18-1925, 80 y.o.   MRN: [id number]  HPI 80 yo male former smoker with COPD   04/19/2014 Acute OV  Complains of prod cough with light-to-dark yellow mucus, wheezing, sore throat/hoarseness, low grade temp up to 101 x1 week.  Taking tylenol and otc cold meds.  Denies any increased SOB, tightness, n/v/d, hemoptysis, chest pain, or edema.  Remains on Spiriva daily. No recent travel or abx use.      Review of Systems Constitutional:   No  weight loss, night sweats,  Fevers, chills,  +fatigue, or  lassitude.  HEENT:   No headaches,  Difficulty swallowing,  Tooth/dental problems, or  Sore throat,                No sneezing, itching, ear ache, nasal congestion, post nasal drip,   CV:  No chest pain,  Orthopnea, PND, swelling in lower extremities, anasarca, dizziness, palpitations, syncope.   GI  No heartburn, indigestion, abdominal pain, nausea, vomiting, diarrhea, change in bowel habits, loss of appetite, bloody stools.   Resp:    No chest wall deformity  Skin: no rash or lesions.  GU: no dysuria, change in color of urine, no urgency or frequency.  No flank pain, no hematuria   MS:  No joint pain or swelling.  No decreased range of motion.  No back pain.  Psych:  No change in mood or affect. No depression or anxiety.  No memory loss.         Objective:   Physical Exam GEN: A/Ox3; pleasant , NAD, elderly    HEENT:  [place]/AT,  EACs-clear, TMs-wnl, NOSE-clear, THROAT-clear, no lesions, no postnasal drip or exudate noted.   NECK:  Supple w/ fair ROM; no JVD; normal carotid impulses w/o bruits; no thyromegaly or nodules palpated; no lymphadenopathy.  RESP  Scattered rhonchi , no accessory muscle use, no dullness to percussion  CARD:  RRR, no m/r/g  , no peripheral edema, pulses intact, no cyanosis or clubbing.  GI:   Soft & nt; nml bowel sounds; no organomegaly or masses detected.  Musco: Warm bil, no deformities  or joint swelling noted.   Neuro: alert, no focal deficits noted.    Skin: Warm, no lesions or rashes     CXR 04/19/2014  NAD     Assessment & Plan:

## 2014-04-19 NOTE — Patient Instructions (Signed)
Augmentin 875 mg twice daily for 7 days, take with food Mucinex DM twice daily as needed for cough and congestion.  Take a prednisone taper over the next week. Fluids and rest Tylenol as needed. Follow-up with Dr. Joya Gaskins as planned and as needed.

## 2014-04-26 ENCOUNTER — Other Ambulatory Visit: Payer: Self-pay | Admitting: Adult Health

## 2014-04-28 ENCOUNTER — Telehealth: Payer: Self-pay | Admitting: Adult Health

## 2014-04-28 ENCOUNTER — Other Ambulatory Visit (INDEPENDENT_AMBULATORY_CARE_PROVIDER_SITE_OTHER): Payer: Medicare Other

## 2014-04-28 DIAGNOSIS — Z0000 Encounter for general adult medical examination without abnormal findings: Secondary | ICD-10-CM | POA: Diagnosis not present

## 2014-04-28 DIAGNOSIS — E559 Vitamin D deficiency, unspecified: Secondary | ICD-10-CM | POA: Diagnosis not present

## 2014-04-28 DIAGNOSIS — E7800 Pure hypercholesterolemia, unspecified: Secondary | ICD-10-CM

## 2014-04-28 DIAGNOSIS — J449 Chronic obstructive pulmonary disease, unspecified: Secondary | ICD-10-CM

## 2014-04-28 DIAGNOSIS — N400 Enlarged prostate without lower urinary tract symptoms: Secondary | ICD-10-CM

## 2014-04-28 LAB — POCT CBC
Granulocyte percent: 67.8 %G (ref 37–80)
HCT, POC: 42.5 % — AB (ref 43.5–53.7)
HEMOGLOBIN: 14 g/dL — AB (ref 14.1–18.1)
Lymph, poc: 3.1 (ref 0.6–3.4)
MCH: 29.7 pg (ref 27–31.2)
MCHC: 33 g/dL (ref 31.8–35.4)
MCV: 90 fL (ref 80–97)
MPV: 7.6 fL (ref 0–99.8)
POC Granulocyte: 6.8 (ref 2–6.9)
POC LYMPH %: 30.6 % (ref 10–50)
Platelet Count, POC: 306 10*3/uL (ref 142–424)
RBC: 4.7 M/uL (ref 4.69–6.13)
RDW, POC: 14.7 %
WBC: 10.1 10*3/uL (ref 4.6–10.2)

## 2014-04-28 MED ORDER — PREDNISONE 10 MG PO TABS: ORAL_TABLET | ORAL | Status: DC

## 2014-04-28 MED ORDER — LEVOFLOXACIN 500 MG PO TABS: 500.0000 mg | ORAL_TABLET | Freq: Every day | ORAL | Status: DC

## 2014-04-28 NOTE — Addendum Note (Signed)
Addended by: Selmer Dominion on: 04/28/2014 02:27 PM   Modules accepted: Orders

## 2014-04-28 NOTE — Telephone Encounter (Signed)
Call in levaquin 500mg  daily x 7days Prednisone 10mg  Take 4 tablets daily for 5 days then stop #20

## 2014-04-28 NOTE — Telephone Encounter (Signed)
Per 04/19/14 OV w/ TP; Patient Instructions       Augmentin 875 mg twice daily for 7 days, take with food Mucinex DM twice daily as needed for cough and congestion.   Take a prednisone taper over the next week. Fluids and rest Tylenol as needed. Follow-up with Dr. Joya Gaskins as planned and as needed  --  Called spoke with pt. He reports he has finished augmentin and prednisone--has only helped slightly. C/o chest congestion, prod cough (yellow/grey phlem), wheezing, SOB is slightly better. Pt is taking mucinex DM BID. Please advise PW thanks  Allergies  Allergen Reactions  . Meperidine Hcl Other (See Comments)    Unknown

## 2014-04-28 NOTE — Telephone Encounter (Signed)
Called spoke with pt. Aware of recs. SH'F called in. Nothing further needed

## 2014-04-29 LAB — NMR, LIPOPROFILE

## 2014-04-29 LAB — LIPID PANEL WITH LDL/HDL RATIO
Cholesterol, Total: 137 mg/dL (ref 100–199)
HDL: 57 mg/dL (ref >39)
LDL Calculated: 66 mg/dL (ref 0–99)
LDL/HDL RATIO: 1.2 ratio (ref 0.0–3.6)
Triglycerides: 68 mg/dL (ref 0–149)
VLDL Cholesterol Cal: 14 mg/dL (ref 5–40)

## 2014-04-29 LAB — VITAMIN D 25 HYDROXY (VIT D DEFICIENCY, FRACTURES): Vit D, 25-Hydroxy: 47.2 ng/mL (ref 30.0–100.0)

## 2014-04-29 LAB — HEPATIC FUNCTION PANEL
ALBUMIN: 3.5 g/dL (ref 3.5–4.7)
ALT: 19 IU/L (ref 0–44)
AST: 16 IU/L (ref 0–40)
Alkaline Phosphatase: 79 IU/L (ref 39–117)
BILIRUBIN TOTAL: 0.2 mg/dL (ref 0.0–1.2)
Bilirubin, Direct: 0.09 mg/dL (ref 0.00–0.40)
TOTAL PROTEIN: 5.5 g/dL — AB (ref 6.0–8.5)

## 2014-04-29 LAB — BMP8+EGFR
BUN / CREAT RATIO: 14 (ref 10–22)
BUN: 19 mg/dL (ref 8–27)
CALCIUM: 8.7 mg/dL (ref 8.6–10.2)
CO2: 25 mmol/L (ref 18–29)
Chloride: 104 mmol/L (ref 97–108)
Creatinine, Ser: 1.38 mg/dL — ABNORMAL HIGH (ref 0.76–1.27)
GFR, EST AFRICAN AMERICAN: 53 mL/min/{1.73_m2} — AB (ref >59)
GFR, EST NON AFRICAN AMERICAN: 46 mL/min/{1.73_m2} — AB (ref >59)
Glucose: 87 mg/dL (ref 65–99)
POTASSIUM: 5.1 mmol/L (ref 3.5–5.2)
SODIUM: 141 mmol/L (ref 134–144)

## 2014-04-30 NOTE — Telephone Encounter (Signed)
If pt is sick, must contact the office.

## 2014-05-05 DIAGNOSIS — R3912 Poor urinary stream: Secondary | ICD-10-CM | POA: Diagnosis not present

## 2014-05-05 DIAGNOSIS — N401 Enlarged prostate with lower urinary tract symptoms: Secondary | ICD-10-CM | POA: Diagnosis not present

## 2014-05-06 DIAGNOSIS — K432 Incisional hernia without obstruction or gangrene: Secondary | ICD-10-CM | POA: Diagnosis not present

## 2014-05-06 DIAGNOSIS — M1712 Unilateral primary osteoarthritis, left knee: Secondary | ICD-10-CM | POA: Diagnosis not present

## 2014-05-06 DIAGNOSIS — M25562 Pain in left knee: Secondary | ICD-10-CM | POA: Diagnosis not present

## 2014-05-17 ENCOUNTER — Other Ambulatory Visit: Payer: Self-pay | Admitting: Critical Care Medicine

## 2014-05-17 ENCOUNTER — Encounter: Payer: Self-pay | Admitting: Critical Care Medicine

## 2014-05-19 ENCOUNTER — Ambulatory Visit (INDEPENDENT_AMBULATORY_CARE_PROVIDER_SITE_OTHER): Payer: Medicare Other | Admitting: Adult Health

## 2014-05-19 ENCOUNTER — Encounter: Payer: Self-pay | Admitting: Adult Health

## 2014-05-19 VITALS — BP 116/80 | HR 78 | Temp 97.1°F | Ht 70.0 in | Wt 179.0 lb

## 2014-05-19 DIAGNOSIS — J449 Chronic obstructive pulmonary disease, unspecified: Secondary | ICD-10-CM

## 2014-05-19 NOTE — Assessment & Plan Note (Signed)
Slow to resolve flare  Recent cxr w/ no acute process xopenex neb.  No further abx or steroids at this time   Plan  Begin BREO 1 puff daily until seen back in office .  Mucinex DM twice daily as needed for cough and congestion.  Follow-up with Dr. Joya Gaskins in 4 weeks and As needed

## 2014-05-19 NOTE — Progress Notes (Signed)
   Subjective:    Patient ID: Charles Golden, male    DOB: 09/16/1925, 80 y.o.   MRN: [id number]  HPI 80 yo male former smoker with COPD  Spirometry from 06/22/2013 revealed FEV1 77% predicted FVC 89% predicted FEV1 to FVC ratio 64% of predicted   05/19/2014 Acute OV  Pt returns for persistent cough and congestion .  Has  prod cough with yellow/gray/white  mucus, wheezing, hoarseness, head congestion with clear drainage and PND.  Denies any dyspnea, f/c/s, n/v/d, hemoptysis.  Does feel better but has not totally resolved .  CXR last ov in Nov showed COPD changes, no acute  process.  Seen 1 month ago , tx for bronchitis with augmentin x 7 d .  Called in 1 week later , given Levaquin and steroid taper.  Got better but still has congestion .    Review of Systems Constitutional:   No  weight loss, night sweats,  Fevers, chills,  +fatigue, or  lassitude.  HEENT:   No headaches,  Difficulty swallowing,  Tooth/dental problems, or  Sore throat,                No sneezing, itching, ear ache, nasal congestion, post nasal drip,   CV:  No chest pain,  Orthopnea, PND, swelling in lower extremities, anasarca, dizziness, palpitations, syncope.   GI  No heartburn, indigestion, abdominal pain, nausea, vomiting, diarrhea, change in bowel habits, loss of appetite, bloody stools.   Resp:    No chest wall deformity  Skin: no rash or lesions.  GU: no dysuria, change in color of urine, no urgency or frequency.  No flank pain, no hematuria   MS:  No joint pain or swelling.  No decreased range of motion.  No back pain.  Psych:  No change in mood or affect. No depression or anxiety.  No memory loss.         Objective:   Physical Exam GEN: A/Ox3; pleasant , NAD, elderly    HEENT:  [place]/AT,  EACs-clear, TMs-wnl, NOSE-clear, THROAT-clear, no lesions, no postnasal drip or exudate noted.   NECK:  Supple w/ fair ROM; no JVD; normal carotid impulses w/o bruits; no thyromegaly or nodules palpated; no  lymphadenopathy.  RESP  Tr rhonchi , no accessory muscle use, no dullness to percussion  CARD:  RRR, no m/r/g  , no peripheral edema, pulses intact, no cyanosis or clubbing.  GI:   Soft & nt; nml bowel sounds; no organomegaly or masses detected.  Musco: Warm bil, no deformities or joint swelling noted.   Neuro: alert, no focal deficits noted.    Skin: Warm, no lesions or rashes     CXR 05/19/2014  NAD     Assessment & Plan:

## 2014-05-19 NOTE — Patient Instructions (Signed)
Begin BREO 1 puff daily until seen back in office .  Mucinex DM twice daily as needed for cough and congestion.  Follow-up with Dr. Joya Gaskins in 4 weeks and As needed

## 2014-06-08 DIAGNOSIS — M25562 Pain in left knee: Secondary | ICD-10-CM | POA: Diagnosis not present

## 2014-06-22 DIAGNOSIS — M1712 Unilateral primary osteoarthritis, left knee: Secondary | ICD-10-CM | POA: Diagnosis not present

## 2014-06-22 DIAGNOSIS — M25562 Pain in left knee: Secondary | ICD-10-CM | POA: Diagnosis not present

## 2014-06-28 ENCOUNTER — Ambulatory Visit (INDEPENDENT_AMBULATORY_CARE_PROVIDER_SITE_OTHER): Payer: Medicare Other | Admitting: Critical Care Medicine

## 2014-06-28 ENCOUNTER — Encounter: Payer: Self-pay | Admitting: Critical Care Medicine

## 2014-06-28 VITALS — BP 118/66 | HR 76 | Temp 98.1°F | Ht 70.5 in | Wt 178.6 lb

## 2014-06-28 DIAGNOSIS — J449 Chronic obstructive pulmonary disease, unspecified: Secondary | ICD-10-CM

## 2014-06-28 DIAGNOSIS — J209 Acute bronchitis, unspecified: Secondary | ICD-10-CM | POA: Diagnosis not present

## 2014-06-28 MED ORDER — BUDESONIDE-FORMOTEROL FUMARATE 160-4.5 MCG/ACT IN AERO: 2.0000 | INHALATION_SPRAY | Freq: Two times a day (BID) | RESPIRATORY_TRACT | Status: DC

## 2014-06-28 NOTE — Patient Instructions (Signed)
Stop Spiriva Start symbicort two puff twice daily Sputum culture will be obtained Return 2 months

## 2014-06-28 NOTE — Progress Notes (Signed)
Subjective:    Patient ID: Charles Golden, male    DOB: [birth year], 81 y.o.   MRN: [id number]  HPI 81 yo male former smoker with COPD  Spirometry from 06/22/2013 revealed FEV1 77% predicted FVC 89% predicted FEV1 to FVC ratio 64% of predicted  06/28/2014 Chief Complaint  Patient presents with  . 1 month follow up    still has chest congestion and cough with yellowish, orange to gray to mucus, and some wheezing.  No chest tightness/CP  1 year f/u.  Pt saw FP MD 03/2014 for f/u and bronchitis flare.  Pt occ rx abx per PCP.  Pt notes diarrhea since 06/12/14.   Last ABX: 05/06/14.  Notes mild pain in abdomen.  No blood in stool.  No emesis.  Cough is productive: yellow to orange gray mucus.  No chest pain is noted. No fever. Notes some exp wheezes.      Review of Systems Constitutional:   No  weight loss, night sweats,  Fevers, chills,  +fatigue, or  lassitude.  HEENT:   No headaches,  Difficulty swallowing,  Tooth/dental problems, or  Sore throat,                No sneezing, itching, ear ache, nasal congestion, post nasal drip,   CV:  No chest pain,  Orthopnea, PND, swelling in lower extremities, anasarca, dizziness, palpitations, syncope.   GI  No heartburn, indigestion, abdominal pain, nausea, vomiting, diarrhea, change in bowel habits, loss of appetite, bloody stools.   Resp:    No chest wall deformity  Skin: no rash or lesions.  GU: no dysuria, change in color of urine, no urgency or frequency.  No flank pain, no hematuria   MS:  No joint pain or swelling.  No decreased range of motion.  No back pain.  Psych:  No change in mood or affect. No depression or anxiety.  No memory loss.         Objective:   Physical Exam BP 118/66 mmHg  Pulse 76  Temp(Src) 98.1 F (36.7 C) (Oral)  Ht 5' 10.5" (1.791 m)  Wt 178 lb 9.6 oz (81.012 kg)  BMI 25.26 kg/m2  SpO2 98%   GEN: A/Ox3; pleasant , NAD, elderly    HEENT:  [place]/AT,  EACs-clear, TMs-wnl, NOSE-clear, THROAT-clear, no  lesions, no postnasal drip or exudate noted.   NECK:  Supple w/ fair ROM; no JVD; normal carotid impulses w/o bruits; no thyromegaly or nodules palpated; no lymphadenopathy.  RESP  Tr rhonchi , no accessory muscle use, no dullness to percussion  CARD:  RRR, no m/r/g  , no peripheral edema, pulses intact, no cyanosis or clubbing.  GI:   Soft & nt; nml bowel sounds; no organomegaly or masses detected.  Musco: Warm bil, no deformities or joint swelling noted.   Neuro: alert, no focal deficits noted.    Skin: Warm, no lesions or rashes     Assessment & Plan:   Obstructive chronic bronchitis without exacerbation Chronic obstructive lung disease with recent exacerbation now improved Plan Stop Spiriva Start symbicort two puff twice daily Sputum culture will be obtained Return 2 months     Updated Medication List Outpatient Encounter Prescriptions as of 06/28/2014  Medication Sig  . aspirin 81 MG tablet Take 81 mg by mouth daily.    Marland Kitchen aspirin-acetaminophen-caffeine (EXCEDRIN MIGRAINE) 250-250-65 MG per tablet Take 1 tablet by mouth every 6 (six) hours as needed for headache.  Marland Kitchen azelastine (ASTELIN) 0.1 % nasal spray  PLACE 1 SPRAY INTO THE NOSE 1 DAY OR 1 DOSE. USE IN EACH NOSTRIL AS DIRECTED  . Cholecalciferol (VITAMIN D3) 2000 UNITS TABS Take 1 tablet by mouth daily.  Marland Kitchen doxazosin (CARDURA) 4 MG tablet Take 4 mg by mouth at bedtime.    . fluticasone (FLONASE) 50 MCG/ACT nasal spray PLACE 1 SPRAY INTO THE NOSE DAILY.  Marland Kitchen gabapentin (NEURONTIN) 300 MG capsule Take 600 mg by mouth daily.  Marland Kitchen guaiFENesin (MUCINEX) 600 MG 12 hr tablet Take 600 mg by mouth 2 (two) times daily.   Marland Kitchen latanoprost (XALATAN) 0.005 % ophthalmic solution Place 1 drop into the left eye at bedtime.   . Loperamide HCl (IMODIUM PO) Take by mouth as needed.  Marland Kitchen PREVALITE 4 G packet Take 1 Package by mouth 2 (two) times daily.  . Probiotic Product (ALIGN) 4 MG CAPS Take 1 tablet by mouth daily.  . vitamin B-12  (CYANOCOBALAMIN) 1000 MCG tablet Take 1,000 mcg by mouth daily.  . [DISCONTINUED] tiotropium (SPIRIVA HANDIHALER) 18 MCG inhalation capsule Place 1 capsule (18 mcg total) into inhaler and inhale daily.  . budesonide-formoterol (SYMBICORT) 160-4.5 MCG/ACT inhaler Inhale 2 puffs into the lungs 2 (two) times daily.

## 2014-06-29 NOTE — Assessment & Plan Note (Signed)
Chronic obstructive lung disease with recent exacerbation now improved Plan Stop Spiriva Start symbicort two puff twice daily Sputum culture will be obtained Return 2 months

## 2014-07-02 ENCOUNTER — Ambulatory Visit: Payer: Self-pay | Admitting: Orthopedic Surgery

## 2014-07-02 NOTE — Progress Notes (Signed)
Preoperative surgical orders have been place into the [facility] hospital system for Charles Golden on 07/02/2014, 12:31 PM  by Mickel Crow for surgery on 07-21-2014.  Preop Knee Scope orders including IV Tylenol and IV Decadron as long as there are no contraindications to the above medications. Arlee Muslim, PA-C

## 2014-07-06 ENCOUNTER — Other Ambulatory Visit: Payer: Medicare Other

## 2014-07-06 DIAGNOSIS — J209 Acute bronchitis, unspecified: Secondary | ICD-10-CM

## 2014-07-06 DIAGNOSIS — R197 Diarrhea, unspecified: Secondary | ICD-10-CM | POA: Diagnosis not present

## 2014-07-08 NOTE — Progress Notes (Signed)
Quick Note:  Call pt and tell him sputum culture is normal No change in medications, no antibiotics needed ______

## 2014-07-08 NOTE — Progress Notes (Signed)
Quick Note:  Spoke with pt. Discussed sputum results and recs per Dr. Joya Gaskins. He verbalized understanding and voiced no further questions or concerns at this time. ______

## 2014-07-09 LAB — RESPIRATORY CULTURE OR RESPIRATORY AND SPUTUM CULTURE: Organism ID, Bacteria: NORMAL

## 2014-07-16 ENCOUNTER — Encounter (HOSPITAL_COMMUNITY): Payer: Self-pay

## 2014-07-16 ENCOUNTER — Encounter (HOSPITAL_COMMUNITY)
Admission: RE | Admit: 2014-07-16 | Discharge: 2014-07-16 | Disposition: A | Discharge status: Home / Self Care | Payer: Medicare Other | Source: Ambulatory Visit | Attending: Orthopedic Surgery | Admitting: Orthopedic Surgery

## 2014-07-16 DIAGNOSIS — Z01818 Encounter for other preprocedural examination: Secondary | ICD-10-CM | POA: Insufficient documentation

## 2014-07-16 DIAGNOSIS — S83249A Other tear of medial meniscus, current injury, unspecified knee, initial encounter: Secondary | ICD-10-CM | POA: Diagnosis not present

## 2014-07-16 HISTORY — DX: Incisional hernia without obstruction or gangrene: K43.2

## 2014-07-16 LAB — CBC
HEMATOCRIT: 42.9 % (ref 39.0–52.0)
HEMOGLOBIN: 13.7 g/dL (ref 13.0–17.0)
MCH: 30.4 pg (ref 26.0–34.0)
MCHC: 31.9 g/dL (ref 30.0–36.0)
MCV: 95.3 fL (ref 78.0–100.0)
Platelets: 270 10*3/uL (ref 150–400)
RBC: 4.5 MIL/uL (ref 4.22–5.81)
RDW: 14.1 % (ref 11.5–15.5)
WBC: 8.2 10*3/uL (ref 4.0–10.5)

## 2014-07-16 LAB — BASIC METABOLIC PANEL
ANION GAP: 6 (ref 5–15)
BUN: 24 mg/dL — AB (ref 6–23)
CHLORIDE: 106 mmol/L (ref 96–112)
CO2: 29 mmol/L (ref 19–32)
CREATININE: 1.41 mg/dL — AB (ref 0.50–1.35)
Calcium: 9.2 mg/dL (ref 8.4–10.5)
GFR calc Af Amer: 50 mL/min — ABNORMAL LOW (ref >90)
GFR calc non Af Amer: 44 mL/min — ABNORMAL LOW (ref >90)
Glucose, Bld: 108 mg/dL — ABNORMAL HIGH (ref 70–99)
POTASSIUM: 5.3 mmol/L — AB (ref 3.5–5.1)
Sodium: 141 mmol/L (ref 135–145)

## 2014-07-16 NOTE — Patient Instructions (Addendum)
Charles Golden  07/16/2014   Your procedure is scheduled on: Wednesday 07/21/2014  Report to [facility] Main  Entrance and follow signs to               [place] at 1 PM.  Call this number if you have problems the morning of surgery [phone]   Remember:  Do not eat food  :After Midnight.MAY HAVE CLEAR LIQUIDS FROM MIDNIGHT UP UNTIL 0900 AM THEN NOTHING UNTIL AFTER SURGERY!     Take these medicines the morning of surgery with A SIP OF WATER: Gabapentin, use Symbicort inhaler                               You may not have any metal on your body including hair pins and              piercings  Do not wear jewelry, make-up, lotions, powders or perfumes.             Do not wear nail polish.  Do not shave  48 hours prior to surgery.              Men may shave face and neck.   Do not bring valuables to the hospital. [place].  Contacts, dentures or bridgework may not be worn into surgery.  Leave suitcase in the car. After surgery it may be brought to your room.     Patients discharged the day of surgery will not be allowed to drive home.  Name and phone number of your driver:  Special Instructions: N/A              Please read over the following fact sheets you were given: _____________________________________________________________________             [facility] - Preparing for Surgery Before surgery, you can play an important role.  Because skin is not sterile, your skin needs to be as free of germs as possible.  You can reduce the number of germs on your skin by washing with CHG (chlorahexidine gluconate) soap before surgery.  CHG is an antiseptic cleaner which kills germs and bonds with the skin to continue killing germs even after washing. Please DO NOT use if you have an allergy to CHG or antibacterial soaps.  If your skin becomes reddened/irritated stop using the CHG and inform your nurse when  you arrive at Short Stay. Do not shave (including legs and underarms) for at least 48 hours prior to the first CHG shower.  You may shave your face/neck. Please follow these instructions carefully:  1.  Shower with CHG Soap the night before surgery and the  morning of Surgery.  2.  If you choose to wash your hair, wash your hair first as usual with your  normal  shampoo.  3.  After you shampoo, rinse your hair and body thoroughly to remove the  shampoo.                           4.  Use CHG as you would any other liquid soap.  You can apply chg directly  to the skin and wash  Gently with a scrungie or clean washcloth.  5.  Apply the CHG Soap to your body ONLY FROM THE NECK DOWN.   Do not use on face/ open                           Wound or open sores. Avoid contact with eyes, ears mouth and genitals (private parts).                       Wash face,  Genitals (private parts) with your normal soap.             6.  Wash thoroughly, paying special attention to the area where your surgery  will be performed.  7.  Thoroughly rinse your body with warm water from the neck down.  8.  DO NOT shower/wash with your normal soap after using and rinsing off  the CHG Soap.                9.  Pat yourself dry with a clean towel.            10.  Wear clean pajamas.            11.  Place clean sheets on your bed the night of your first shower and do not  sleep with pets. Day of Surgery : Do not apply any lotions/deodorants the morning of surgery.  Please wear clean clothes to the hospital/surgery center.  FAILURE TO FOLLOW THESE INSTRUCTIONS MAY RESULT IN THE CANCELLATION OF YOUR SURGERY PATIENT SIGNATURE_________________________________  NURSE SIGNATURE__________________________________  ________________________________________________________________________   Charles Golden  An incentive spirometer is a tool that can help keep your lungs clear and active. This tool measures  how well you are filling your lungs with each breath. Taking long deep breaths may help reverse or decrease the chance of developing breathing (pulmonary) problems (especially infection) following:  A long period of time when you are unable to move or be active. BEFORE THE PROCEDURE   If the spirometer includes an indicator to show your best effort, your nurse or respiratory therapist will set it to a desired goal.  If possible, sit up straight or lean slightly forward. Try not to slouch.  Hold the incentive spirometer in an upright position. INSTRUCTIONS FOR USE  1. Sit on the edge of your bed if possible, or sit up as far as you can in bed or on a chair. 2. Hold the incentive spirometer in an upright position. 3. Breathe out normally. 4. Place the mouthpiece in your mouth and seal your lips tightly around it. 5. Breathe in slowly and as deeply as possible, raising the piston or the ball toward the top of the column. 6. Hold your breath for 3-5 seconds or for as long as possible. Allow the piston or ball to fall to the bottom of the column. 7. Remove the mouthpiece from your mouth and breathe out normally. 8. Rest for a few seconds and repeat Steps 1 through 7 at least 10 times every 1-2 hours when you are awake. Take your time and take a few normal breaths between deep breaths. 9. The spirometer may include an indicator to show your best effort. Use the indicator as a goal to work toward during each repetition. 10. After each set of 10 deep breaths, practice coughing to be sure your lungs are clear. If you have an incision (the cut made at the time of surgery),  support your incision when coughing by placing a pillow or rolled up towels firmly against it. Once you are able to get out of bed, walk around indoors and cough well. You may stop using the incentive spirometer when instructed by your caregiver.  RISKS AND COMPLICATIONS  Take your time so you do not get dizzy or light-headed.  If  you are in pain, you may need to take or ask for pain medication before doing incentive spirometry. It is harder to take a deep breath if you are having pain. AFTER USE  Rest and breathe slowly and easily.  It can be helpful to keep track of a log of your progress. Your caregiver can provide you with a simple table to help with this. If you are using the spirometer at home, follow these instructions: [place] IF:   You are having difficultly using the spirometer.  You have trouble using the spirometer as often as instructed.  Your pain medication is not giving enough relief while using the spirometer.  You develop fever of 100.5 F (38.1 C) or higher. SEEK IMMEDIATE MEDICAL CARE IF:   You cough up bloody sputum that had not been present before.  You develop fever of 102 F (38.9 C) or greater.  You develop worsening pain at or near the incision site. MAKE SURE YOU:   Understand these instructions.  Will watch your condition.  Will get help right away if you are not doing well or get worse. Document Released: 09/24/2006 Document Revised: 08/06/2011 Document Reviewed: 11/25/2006 [employer] Patient Information 2014 [place].   ________________________________________________________________________    CLEAR LIQUID DIET   Foods Allowed                                                                     Foods Excluded  Coffee and tea, regular and decaf                             liquids that you cannot  Plain Jell-O in any flavor                                             see through such as: Fruit ices (not with fruit pulp)                                     milk, soups, orange juice  Iced Popsicles                                    All solid food Carbonated beverages, regular and diet                                    Cranberry, grape and apple juices Sports drinks like Gatorade Lightly seasoned clear broth or consume(fat free) Sugar, honey  syrup  Sample Menu Breakfast  Lunch                                     Supper Cranberry juice                    Beef broth                            Chicken broth Jell-O                                     Grape juice                           Apple juice Coffee or tea                        Jell-O                                      Popsicle                                                Coffee or tea                        Coffee or tea  _____________________________________________________________________

## 2014-07-16 NOTE — Progress Notes (Signed)
bmet results faxed to dr Wynelle Link by epic

## 2014-07-20 DIAGNOSIS — S83249A Other tear of medial meniscus, current injury, unspecified knee, initial encounter: Secondary | ICD-10-CM | POA: Diagnosis present

## 2014-07-20 HISTORY — DX: Other tear of medial meniscus, current injury, unspecified knee, initial encounter: S83.249A

## 2014-07-20 NOTE — Progress Notes (Signed)
Fax received from dr Wynelle Link no action on faxed bmet results per dr Benjaman Pott, fax placed on chart

## 2014-07-20 NOTE — H&P (Signed)
CC- WALLER Charles Golden is a 81 y.o. male who presents with left knee pain.  HPI- . Knee Pain: Patient presents with knee pain involving the  left knee. Onset of the symptoms was several months ago. Inciting event: none known. Current symptoms include giving out, pain located medially and stiffness. Pain is aggravated by kneeling, lateral movements, pivoting, rising after sitting and squatting.  Patient has had no prior knee problems. Evaluation to date: MRI: abnormal medial meniscal tear. Treatment to date: corticosteroid injection which was not very effective.  Past Medical History  Diagnosis Date  . IBS (irritable bowel syndrome)   . Enlarged prostate   . Sinus congestion   . Sciatic pain   . COPD (chronic obstructive pulmonary disease)   . Glucagonoma   . Shortness of breath   . HOH (hard of hearing)   . Hernia, incisional     at present    Past Surgical History  Procedure Laterality Date  . Hemorrhoid surgery    . Laparotomy N/A 09/04/2012    Procedure: EXPLORATORY LAPAROTOMY;  Surgeon: Donato Heinz, MD;  Location: [facility];  Service: General;  Laterality: N/A;  . Bowel resection N/A 09/04/2012    Procedure: SMALL BOWEL RESECTION;  Surgeon: Donato Heinz, MD;  Location: [facility];  Service: General;  Laterality: N/A;  Anastimosis  . Hernia repair Right 70's  . Inguinal hernia repair Left 09/14/2013    Procedure: LEFT INGUINAL HERNIORRHAPHY;  Surgeon: Jamesetta So, MD;  Location: [facility];  Service: General;  Laterality: Left;  . Insertion of mesh Left 09/14/2013    Procedure: INSERTION OF MESH;  Surgeon: Jamesetta So, MD;  Location: [facility];  Service: General;  Laterality: Left;  . Tonsillectomy      Prior to Admission medications   Medication Sig Start Date End Date Taking? Authorizing Provider  aspirin 81 MG tablet Take 81 mg by mouth daily.     Yes Historical Provider, MD  aspirin-acetaminophen-caffeine (EXCEDRIN MIGRAINE) [phone] MG per tablet Take 1 tablet by mouth every 6  (six) hours as needed for headache.   Yes Historical Provider, MD  azelastine (ASTELIN) 0.1 % nasal spray PLACE 1 SPRAY INTO THE NOSE 1 DAY OR 1 DOSE. USE IN EACH NOSTRIL AS DIRECTED 01/26/14  Yes Chipper Herb, MD  budesonide-formoterol [facility]) 160-4.5 MCG/ACT inhaler Inhale 2 puffs into the lungs 2 (two) times daily. 06/28/14  Yes Elsie Stain, MD  Cholecalciferol (VITAMIN D3) 2000 UNITS TABS Take 1 tablet by mouth daily.   Yes Historical Provider, MD  doxazosin (CARDURA) 4 MG tablet Take 4 mg by mouth at bedtime.     Yes Historical Provider, MD  fluticasone (FLONASE) 50 MCG/ACT nasal spray PLACE 1 SPRAY INTO THE NOSE DAILY.   Yes Chipper Herb, MD  gabapentin (NEURONTIN) 300 MG capsule Take 600 mg by mouth daily.   Yes Historical Provider, MD  guaiFENesin (MUCINEX) 600 MG 12 hr tablet Take 600 mg by mouth 2 (two) times daily.    Yes Historical Provider, MD  latanoprost (XALATAN) 0.005 % ophthalmic solution Place 1 drop into the left eye at bedtime.  08/05/12  Yes Historical Provider, MD  Loperamide HCl (IMODIUM PO) Take 1 tablet by mouth every 8 (eight) hours as needed (diahhrea).    Yes Historical Provider, MD  Probiotic Product (ALIGN) 4 MG CAPS Take 1 tablet by mouth daily.   Yes Historical Provider, MD  vitamin B-12 (CYANOCOBALAMIN) 1000 MCG tablet Take 1,000 mcg by mouth  daily.   Yes Historical Provider, MD   KNEE EXAM antalgic gait, soft tissue tenderness over medial joint line, no effusion, negative drawer sign, collateral ligaments intact  Physical Examination: General appearance - alert, well appearing, and in no distress Mental status - alert, oriented to person, place, and time Chest - clear to auscultation, no wheezes, rales or rhonchi, symmetric air entry Heart - normal rate, regular rhythm, normal S1, S2, no murmurs, rubs, clicks or gallops Abdomen - soft, nontender, nondistended, no masses or organomegaly Neurological - alert, oriented, normal speech, no focal findings or  movement disorder noted   Asessment/Plan--- Left knee medial meniscal tear- - Plan left knee arthroscopy with meniscal debridement. Procedure risks and potential comps discussed with patient who elects to proceed. Goals are decreased pain and increased function with a high likelihood of achieving both

## 2014-07-21 ENCOUNTER — Ambulatory Visit (HOSPITAL_COMMUNITY): Payer: Medicare Other | Admitting: Anesthesiology

## 2014-07-21 ENCOUNTER — Encounter (HOSPITAL_COMMUNITY)
Admission: RE | Disposition: A | Discharge status: Home / Self Care | Payer: Self-pay | Source: Ambulatory Visit | Attending: Orthopedic Surgery

## 2014-07-21 ENCOUNTER — Encounter (HOSPITAL_COMMUNITY): Payer: Self-pay | Admitting: Anesthesiology

## 2014-07-21 ENCOUNTER — Ambulatory Visit (HOSPITAL_COMMUNITY)
Admission: RE | Admit: 2014-07-21 | Discharge: 2014-07-21 | Disposition: A | Discharge status: Home / Self Care | Payer: Medicare Other | Source: Ambulatory Visit | Attending: Orthopedic Surgery | Admitting: Orthopedic Surgery

## 2014-07-21 DIAGNOSIS — S83242A Other tear of medial meniscus, current injury, left knee, initial encounter: Secondary | ICD-10-CM | POA: Diagnosis not present

## 2014-07-21 DIAGNOSIS — Y939 Activity, unspecified: Secondary | ICD-10-CM | POA: Insufficient documentation

## 2014-07-21 DIAGNOSIS — J449 Chronic obstructive pulmonary disease, unspecified: Secondary | ICD-10-CM | POA: Diagnosis not present

## 2014-07-21 DIAGNOSIS — Z79899 Other long term (current) drug therapy: Secondary | ICD-10-CM | POA: Insufficient documentation

## 2014-07-21 DIAGNOSIS — K589 Irritable bowel syndrome without diarrhea: Secondary | ICD-10-CM | POA: Insufficient documentation

## 2014-07-21 DIAGNOSIS — Y929 Unspecified place or not applicable: Secondary | ICD-10-CM | POA: Insufficient documentation

## 2014-07-21 DIAGNOSIS — S83249A Other tear of medial meniscus, current injury, unspecified knee, initial encounter: Secondary | ICD-10-CM | POA: Diagnosis present

## 2014-07-21 DIAGNOSIS — M23322 Other meniscus derangements, posterior horn of medial meniscus, left knee: Secondary | ICD-10-CM | POA: Diagnosis not present

## 2014-07-21 DIAGNOSIS — Y999 Unspecified external cause status: Secondary | ICD-10-CM | POA: Insufficient documentation

## 2014-07-21 DIAGNOSIS — N400 Enlarged prostate without lower urinary tract symptoms: Secondary | ICD-10-CM | POA: Insufficient documentation

## 2014-07-21 DIAGNOSIS — Z87891 Personal history of nicotine dependence: Secondary | ICD-10-CM | POA: Insufficient documentation

## 2014-07-21 DIAGNOSIS — Z7982 Long term (current) use of aspirin: Secondary | ICD-10-CM | POA: Diagnosis not present

## 2014-07-21 DIAGNOSIS — R0602 Shortness of breath: Secondary | ICD-10-CM | POA: Diagnosis not present

## 2014-07-21 DIAGNOSIS — X58XXXA Exposure to other specified factors, initial encounter: Secondary | ICD-10-CM | POA: Diagnosis not present

## 2014-07-21 HISTORY — PX: KNEE ARTHROSCOPY: SHX127

## 2014-07-21 SURGERY — ARTHROSCOPY, KNEE
Anesthesia: General | Site: Knee | Laterality: Left

## 2014-07-21 MED ORDER — LIDOCAINE HCL (CARDIAC) 20 MG/ML IV SOLN
INTRAVENOUS | Status: DC | PRN
  Administered 2014-07-21: 50 mg via INTRAVENOUS

## 2014-07-21 MED ORDER — CHLORHEXIDINE GLUCONATE 4 % EX LIQD: 60.0000 mL | Freq: Once | CUTANEOUS | Status: DC

## 2014-07-21 MED ORDER — BUPIVACAINE-EPINEPHRINE (PF) 0.25% -1:200000 IJ SOLN
INTRAMUSCULAR | Status: AC
  Filled 2014-07-21: qty 30

## 2014-07-21 MED ORDER — CEFAZOLIN SODIUM-DEXTROSE 2-3 GM-% IV SOLR
2.0000 g | INTRAVENOUS | Status: AC
  Administered 2014-07-21: 2 g via INTRAVENOUS

## 2014-07-21 MED ORDER — PROPOFOL 10 MG/ML IV BOLUS
INTRAVENOUS | Status: DC | PRN
  Administered 2014-07-21: 150 mg via INTRAVENOUS

## 2014-07-21 MED ORDER — FENTANYL CITRATE 0.05 MG/ML IJ SOLN
INTRAMUSCULAR | Status: AC
  Filled 2014-07-21: qty 2

## 2014-07-21 MED ORDER — METHOCARBAMOL 500 MG PO TABS: 500.0000 mg | ORAL_TABLET | Freq: Four times a day (QID) | ORAL | Status: DC | PRN

## 2014-07-21 MED ORDER — ACETAMINOPHEN 10 MG/ML IV SOLN
1000.0000 mg | Freq: Once | INTRAVENOUS | Status: AC
  Administered 2014-07-21: 1000 mg via INTRAVENOUS
  Filled 2014-07-21: qty 100

## 2014-07-21 MED ORDER — LACTATED RINGERS IV SOLN
INTRAVENOUS | Status: DC | PRN
  Administered 2014-07-21 (×2): via INTRAVENOUS

## 2014-07-21 MED ORDER — DEXAMETHASONE SODIUM PHOSPHATE 10 MG/ML IJ SOLN
10.0000 mg | Freq: Once | INTRAMUSCULAR | Status: AC
  Administered 2014-07-21: 10 mg via INTRAVENOUS

## 2014-07-21 MED ORDER — HYDROCODONE-ACETAMINOPHEN 5-325 MG PO TABS: 1.0000 | ORAL_TABLET | Freq: Four times a day (QID) | ORAL | Status: DC | PRN

## 2014-07-21 MED ORDER — FENTANYL CITRATE 0.05 MG/ML IJ SOLN
INTRAMUSCULAR | Status: DC | PRN
  Administered 2014-07-21 (×4): 25 ug via INTRAVENOUS
  Administered 2014-07-21: 50 ug via INTRAVENOUS

## 2014-07-21 MED ORDER — SODIUM CHLORIDE 0.9 % IV SOLN: INTRAVENOUS | Status: DC

## 2014-07-21 MED ORDER — ONDANSETRON HCL 4 MG/2ML IJ SOLN
INTRAMUSCULAR | Status: AC
  Filled 2014-07-21: qty 2

## 2014-07-21 MED ORDER — BUPIVACAINE-EPINEPHRINE 0.25% -1:200000 IJ SOLN
INTRAMUSCULAR | Status: DC | PRN
  Administered 2014-07-21: 20 mL

## 2014-07-21 MED ORDER — LACTATED RINGERS IR SOLN
Status: DC | PRN
  Administered 2014-07-21 (×3): 3000 mL

## 2014-07-21 MED ORDER — METHOCARBAMOL 500 MG PO TABS: 500.0000 mg | ORAL_TABLET | Freq: Four times a day (QID) | ORAL | Status: DC

## 2014-07-21 MED ORDER — LACTATED RINGERS IV SOLN: INTRAVENOUS | Status: DC

## 2014-07-21 MED ORDER — FENTANYL CITRATE 0.05 MG/ML IJ SOLN
25.0000 ug | INTRAMUSCULAR | Status: DC | PRN
  Administered 2014-07-21 (×4): 25 ug via INTRAVENOUS

## 2014-07-21 MED ORDER — CEFAZOLIN SODIUM-DEXTROSE 2-3 GM-% IV SOLR
INTRAVENOUS | Status: AC
  Filled 2014-07-21: qty 50

## 2014-07-21 MED ORDER — LACTATED RINGERS IV SOLN
INTRAVENOUS | Status: DC
  Administered 2014-07-21: 1000 mL via INTRAVENOUS

## 2014-07-21 MED ORDER — PROMETHAZINE HCL 25 MG/ML IJ SOLN: 6.2500 mg | INTRAMUSCULAR | Status: DC | PRN

## 2014-07-21 MED ORDER — HYDROCODONE-ACETAMINOPHEN 5-325 MG PO TABS
1.0000 | ORAL_TABLET | Freq: Four times a day (QID) | ORAL | Status: DC | PRN
  Administered 2014-07-21: 1 via ORAL
  Filled 2014-07-21: qty 1

## 2014-07-21 MED ORDER — DEXAMETHASONE SODIUM PHOSPHATE 10 MG/ML IJ SOLN
INTRAMUSCULAR | Status: AC
  Filled 2014-07-21: qty 1

## 2014-07-21 MED ORDER — PROPOFOL 10 MG/ML IV BOLUS
INTRAVENOUS | Status: AC
  Filled 2014-07-21: qty 20

## 2014-07-21 MED ORDER — ONDANSETRON HCL 4 MG/2ML IJ SOLN
INTRAMUSCULAR | Status: DC | PRN
  Administered 2014-07-21: 4 mg via INTRAVENOUS

## 2014-07-21 SURGICAL SUPPLY — 29 items
BANDAGE ELASTIC 6 VELCRO ST LF (GAUZE/BANDAGES/DRESSINGS) ×3 IMPLANT
BLADE 4.2CUDA (BLADE) ×3 IMPLANT
COVER SURGICAL LIGHT HANDLE (MISCELLANEOUS) ×3 IMPLANT
CUFF TOURN SGL QUICK 34 (TOURNIQUET CUFF) ×2
CUFF TRNQT CYL 34X4X40X1 (TOURNIQUET CUFF) ×1 IMPLANT
DRAPE U-SHAPE 47X51 STRL (DRAPES) ×3 IMPLANT
DRSG EMULSION OIL 3X3 NADH (GAUZE/BANDAGES/DRESSINGS) ×3 IMPLANT
DRSG PAD ABDOMINAL 8X10 ST (GAUZE/BANDAGES/DRESSINGS) IMPLANT
DURAPREP 26ML APPLICATOR (WOUND CARE) ×3 IMPLANT
GAUZE SPONGE 4X4 12PLY STRL (GAUZE/BANDAGES/DRESSINGS) ×3 IMPLANT
GAUZE SPONGE 4X4 16PLY XRAY LF (GAUZE/BANDAGES/DRESSINGS) IMPLANT
GLOVE BIO SURGEON STRL SZ8 (GLOVE) ×3 IMPLANT
GLOVE BIOGEL PI IND STRL 8 (GLOVE) ×1 IMPLANT
GLOVE BIOGEL PI INDICATOR 8 (GLOVE) ×2
GOWN STRL REUS W/TWL LRG LVL3 (GOWN DISPOSABLE) ×3 IMPLANT
KIT BASIN OR (CUSTOM PROCEDURE TRAY) ×3 IMPLANT
MANIFOLD NEPTUNE II (INSTRUMENTS) ×3 IMPLANT
MARKER PEN SURG W/LABELS BLK (STERILIZATION PRODUCTS) ×3 IMPLANT
PACK ARTHROSCOPY WL (CUSTOM PROCEDURE TRAY) ×3 IMPLANT
PACK ICE MAXI GEL EZY WRAP (MISCELLANEOUS) ×9 IMPLANT
PAD ABD 8X10 STRL (GAUZE/BANDAGES/DRESSINGS) ×3 IMPLANT
PADDING CAST COTTON 6X4 STRL (CAST SUPPLIES) ×3 IMPLANT
POSITIONER SURGICAL ARM (MISCELLANEOUS) ×3 IMPLANT
SET ARTHROSCOPY TUBING (MISCELLANEOUS) ×2
SET ARTHROSCOPY TUBING LN (MISCELLANEOUS) ×1 IMPLANT
SUT ETHILON 4 0 PS 2 18 (SUTURE) ×3 IMPLANT
TOWEL OR 17X26 10 PK STRL BLUE (TOWEL DISPOSABLE) ×3 IMPLANT
WAND 90 DEG TURBOVAC W/CORD (SURGICAL WAND) ×3 IMPLANT
WRAP KNEE MAXI GEL POST OP (GAUZE/BANDAGES/DRESSINGS) ×3 IMPLANT

## 2014-07-21 NOTE — Op Note (Signed)
Preoperative diagnosis-  Left knee medial meniscal tear  Postoperative diagnosis Left- knee medial meniscal tear  Procedure- Left knee arthroscopy with medial  meniscal debridement    Surgeon- Charles Golden. Charles Aguayo, MD  Anesthesia-General  EBL-  Minimal  Complications- None  Condition- PACU - hemodynamically stable.  Brief clinical note- -Charles Golden is a 81 y.o.  male with a several month history of left knee pain and mechanical symptoms. Exam and history suggested medial meniscal tear confirmed by MRI. The patient presents now for arthroscopy and debridement   Procedure in detail -       After successful administration of General anesthetic, a tourmiquet is placed high on the Left  thigh and the Left lower extremity is prepped and draped in the usual sterile fashion. Time out is performed by the surgical team. Standard superomedial and inferolateral portal sites are marked and incisions made with an 11 blade. The inflow cannula is passed through the superomedial portal and camera through the inferolateral portal and inflow is initiated. Arthroscopic visualization proceeds.      The undersurface of the patella and trochlea are visualized and they are normal. The medial and lateral gutters are visualized and there are  no loose bodies. Flexion and valgus force is applied to the knee and the medial compartment is entered. A spinal needle is passed into the joint through the site marked for the inferomedial portal. A small incision is made and the dilator passed into the joint. The findings for the medial compartment are unstable tear body and posterior horn medial meniscus with scattered Grade IV changes throughout medial compartment but no unstable chondral defects. . The tear is debrided to a stable base with baskets and a shaver and sealed off with the Arthrocare.  It is probed and found to be stable.    The intercondylar notch is visualized and the ACL appears normal. The lateral compartment  is entered and the findings are normal .      The joint is again inspected and there are no other tears, defects or loose bodies identified. The arthroscopic equipment is then removed from the inferior portals which are closed with interrupted 4-0 nylon. 20 ml of .25% Marcaine with epinephrine are injected through the inflow cannula and the cannula is then removed and the portal closed with nylon. The incisions are cleaned and dried and a bulky sterile dressing is applied. The patient is then awakened and transported to recovery in stable condition.   07/21/2014, 3:15 PM

## 2014-07-21 NOTE — Discharge Instructions (Signed)
Arthroscopic Procedure, Knee °An arthroscopic procedure can find what is wrong with your knee. °PROCEDURE °Arthroscopy is a surgical technique that allows your orthopedic surgeon to diagnose and treat your knee injury with accuracy. They will look into your knee through a small instrument. This is almost like a small (pencil sized) telescope. Because arthroscopy affects your knee less than open knee surgery, you can anticipate a more rapid recovery. Taking an active role by following your caregiver's instructions will help with rapid and complete recovery. Use crutches, rest, elevation, ice, and knee exercises as instructed. The length of recovery depends on various factors including type of injury, age, physical condition, medical conditions, and your rehabilitation. °Your knee is the joint between the large bones (femur and tibia) in your leg. Cartilage covers these bone ends which are smooth and slippery and allow your knee to bend and move smoothly. Two menisci, thick, semi-lunar shaped pads of cartilage which form a rim inside the joint, help absorb shock and stabilize your knee. Ligaments bind the bones together and support your knee joint. Muscles move the joint, help support your knee, and take stress off the joint itself. Because of this all programs and physical therapy to rehabilitate an injured or repaired knee require rebuilding and strengthening your muscles. °AFTER THE PROCEDURE °· After the procedure, you will be moved to a recovery area until most of the effects of the medication have worn off. Your caregiver will discuss the test results with you.  °· Only take over-the-counter or prescription medicines for pain, discomfort, or fever as directed by your caregiver.  °SEEK MEDICAL CARE IF:  °· You have increased bleeding from your wounds.  °· You see redness, swelling, or have increasing pain in your wounds.  °· You have pus coming from your wound.  °· You have an oral temperature above 102° F (38.9°  C).  °· You notice a bad smell coming from the wound or dressing.  °· You have severe pain with any motion of your knee.  °SEEK IMMEDIATE MEDICAL CARE IF:  °· You develop a rash.  °· You have difficulty breathing.  °· You have any allergic problems.  °FURTHER INSTRUCTIONS: °· You may start showering two days after being discharged home but do not submerge the incisions under water.  °· Change dressing 48 hours after the procedure and then cover the small incisions with band aids until your follow up visit. °· Avoid periods of inactivity such as sitting longer than an hour when not asleep. This helps prevent blood clots.  °· You may put full weight on your legs and walk as much as is comfortable.  °· Do not drive while taking narcotics.  °Wear the elastic stockings for three weeks following surgery during the day but you may remove then at night. °· Make sure you keep all of your appointments after your operation with all of your doctors and caregivers. You should call the office at [phone] and make an appointment for approximately one week after the date of your surgery. °· Please pick up a stool softener and laxative for home use as long as you are requiring pain medications. °· ICE to the affected knee every three hours for 30 minutes at a time and then as needed for pain and swelling.  Continue to use ice on the knee for pain and swelling from surgery. You may notice swelling that will progress down to the foot and ankle.  This is normal after surgery.  Elevate the   leg when you are not up walking on it.   RANGE OF MOTION AND STRENGTHENING EXERCISES  Rehabilitation of the knee is important following a knee injury or an operation. After just a few days of immobilization, the muscles of the thigh which control the knee become weakened and shrink (atrophy). Knee exercises are designed to build up the tone and strength of the thigh muscles and to improve knee motion. Often times heat used for twenty to thirty  minutes before working out will loosen up your tissues and help with improving the range of motion but do not use heat for the first two weeks following surgery. These exercises can be done on a training (exercise) mat, on the floor, on a table or on a bed. Use what ever works the best and is most comfortable for you Knee exercises include:       QUAD STRENGTHENING EXERCISES Strengthening Quadriceps Sets  Tighten muscles on top of thigh by pushing knees down into floor or table. Hold for 20 seconds. Repeat 10 times. Do 2 sessions per day.    Strengthening Terminal Knee Extension  With knee bent over bolster, straighten knee by tightening muscle on top of thigh. Be sure to keep bottom of knee on bolster. Hold for 20 seconds. Repeat 10 times. Do 2 sessions per day.   Straight Leg with Bent Knee  Lie on back with opposite leg bent. Keep involved knee slightly bent at knee and raise leg 4-6". Hold for 10 seconds. Repeat 20 times per set. Do 2 sets per session. Do 2 sessions per day.      General Anesthesia, Care After Refer to this sheet in the next few weeks. These instructions provide you with information on caring for yourself after your procedure. Your health care provider may also give you more specific instructions. Your treatment has been planned according to current medical practices, but problems sometimes occur. Call your health care provider if you have any problems or questions after your procedure. WHAT TO EXPECT AFTER THE PROCEDURE After the procedure, it is typical to experience:  Sleepiness.  Nausea and vomiting. HOME CARE INSTRUCTIONS  For the first 24 hours after general anesthesia:  Have a responsible person with you.  Do not drive a car. If you are alone, do not take public transportation.  Do not drink alcohol.  Do not take medicine that has not been prescribed by your health care provider.  Do not sign important papers or make important  decisions.  You may resume a normal diet and activities as directed by your health care provider.  Change bandages (dressings) as directed.  If you have questions or problems that seem related to general anesthesia, call the hospital and ask for the anesthetist or anesthesiologist on call. SEEK MEDICAL CARE IF:  You have nausea and vomiting that continue the day after anesthesia.  You develop a rash. SEEK IMMEDIATE MEDICAL CARE IF:   You have difficulty breathing.  You have chest pain.  You have any allergic problems. Document Released: 08/20/2000 Document Revised: 05/19/2013 Document Reviewed: 11/27/2012 [facility] Patient Information 2015 [place], [place]. This information is not intended to replace advice given to you by your health care provider. Make sure you discuss any questions you have with your health care provider.

## 2014-07-21 NOTE — Anesthesia Preprocedure Evaluation (Addendum)
Anesthesia Evaluation  Patient identified by MRN, date of birth, ID band Patient awake    Reviewed: Allergy & Precautions, NPO status , Patient's Chart, lab work & pertinent test results  Airway Mallampati: II  TM Distance: >3 FB Neck ROM: Full    Dental no notable dental hx. (+) Partial Upper   Pulmonary COPD COPD inhaler, former smoker,  breath sounds clear to auscultation  Pulmonary exam normal       Cardiovascular negative cardio ROS  Rhythm:Regular Rate:Normal     Neuro/Psych  Headaches, negative neurological ROS  negative psych ROS   GI/Hepatic negative GI ROS, Neg liver ROS,   Endo/Other  negative endocrine ROS  Renal/GU negative Renal ROS  negative genitourinary   Musculoskeletal negative musculoskeletal ROS (+)   Abdominal   Peds negative pediatric ROS (+)  Hematology negative hematology ROS (+)   Anesthesia Other Findings   Reproductive/Obstetrics negative OB ROS                           Anesthesia Physical Anesthesia Plan  ASA: II  Anesthesia Plan: General   Post-op Pain Management:    Induction: Intravenous  Airway Management Planned: LMA  Additional Equipment:   Intra-op Plan:   Post-operative Plan: Extubation in OR  Informed Consent: I have reviewed the patients History and Physical, chart, labs and discussed the procedure including the risks, benefits and alternatives for the proposed anesthesia with the patient or authorized representative who has indicated his/her understanding and acceptance.   Dental advisory given  Plan Discussed with: CRNA, Anesthesiologist and Surgeon  Anesthesia Plan Comments:         Anesthesia Quick Evaluation

## 2014-07-21 NOTE — Interval H&P Note (Signed)
History and Physical Interval Note:  07/21/2014 2:26 PM  Charles Golden  has presented today for surgery, with the diagnosis of LEFT KNEE MEDIAL MENISCAL TEAR   The various methods of treatment have been discussed with the patient and family. After consideration of risks, benefits and other options for treatment, the patient has consented to  Procedure(s): LEFT KNEE ARTHROSCOPY WITH DEBRIDEMENT  (Left) as a surgical intervention .  [name] history has been reviewed, patient examined, no change in status, stable for surgery.  I have reviewed the patient's chart and labs.  Questions were answered to the patient's satisfaction.     Gearlean Alf

## 2014-07-21 NOTE — Transfer of Care (Signed)
Immediate Anesthesia Transfer of Care Note  Patient: Charles Golden  Procedure(s) Performed: Procedure(s): LEFT KNEE ARTHROSCOPY WITH MEDIAL MENISCAL DEBRIDEMENT  (Left)  Patient Location: PACU  Anesthesia Type:General  Level of Consciousness: awake, alert  and oriented  Airway & Oxygen Therapy: Patient Spontanous Breathing and Patient connected to face mask oxygen  Post-op Assessment: Report given to RN  Post vital signs: Reviewed and stable  Last Vitals:  Filed Vitals:   07/21/14 1229  BP: 127/66  Pulse: 66  Temp: 36.4 C  Resp: 18    Complications: No apparent anesthesia complications

## 2014-07-21 NOTE — Anesthesia Postprocedure Evaluation (Signed)
  Anesthesia Post-op Note  Patient: Charles Golden  Procedure(s) Performed: Procedure(s) (LRB): LEFT KNEE ARTHROSCOPY WITH MEDIAL MENISCAL DEBRIDEMENT  (Left)  Patient Location: PACU  Anesthesia Type: General  Level of Consciousness: awake and alert   Airway and Oxygen Therapy: Patient Spontanous Breathing  Post-op Pain: mild  Post-op Assessment: Post-op Vital signs reviewed, Patient's Cardiovascular Status Stable, Respiratory Function Stable, Patent Airway and No signs of Nausea or vomiting  Last Vitals:  Filed Vitals:   07/21/14 1605  BP:   Pulse: 59  Temp:   Resp: 12    Post-op Vital Signs: stable   Complications: No apparent anesthesia complications

## 2014-07-22 ENCOUNTER — Encounter (HOSPITAL_COMMUNITY): Payer: Self-pay | Admitting: Orthopedic Surgery

## 2014-07-27 DIAGNOSIS — S83232D Complex tear of medial meniscus, current injury, left knee, subsequent encounter: Secondary | ICD-10-CM | POA: Diagnosis not present

## 2014-07-27 DIAGNOSIS — Z4789 Encounter for other orthopedic aftercare: Secondary | ICD-10-CM | POA: Diagnosis not present

## 2014-08-10 DIAGNOSIS — H40053 Ocular hypertension, bilateral: Secondary | ICD-10-CM | POA: Diagnosis not present

## 2014-08-10 DIAGNOSIS — H26493 Other secondary cataract, bilateral: Secondary | ICD-10-CM | POA: Diagnosis not present

## 2014-08-10 DIAGNOSIS — Z961 Presence of intraocular lens: Secondary | ICD-10-CM | POA: Diagnosis not present

## 2014-08-10 DIAGNOSIS — H04123 Dry eye syndrome of bilateral lacrimal glands: Secondary | ICD-10-CM | POA: Diagnosis not present

## 2014-08-17 ENCOUNTER — Encounter: Payer: Self-pay | Admitting: Family Medicine

## 2014-08-17 ENCOUNTER — Ambulatory Visit (INDEPENDENT_AMBULATORY_CARE_PROVIDER_SITE_OTHER): Payer: Medicare Other | Admitting: Family Medicine

## 2014-08-17 VITALS — BP 129/70 | HR 89 | Temp 97.4°F | Ht 70.0 in | Wt 180.6 lb

## 2014-08-17 DIAGNOSIS — G5792 Unspecified mononeuropathy of left lower limb: Secondary | ICD-10-CM

## 2014-08-17 DIAGNOSIS — E7800 Pure hypercholesterolemia, unspecified: Secondary | ICD-10-CM

## 2014-08-17 DIAGNOSIS — E559 Vitamin D deficiency, unspecified: Secondary | ICD-10-CM

## 2014-08-17 DIAGNOSIS — G5793 Unspecified mononeuropathy of bilateral lower limbs: Secondary | ICD-10-CM

## 2014-08-17 DIAGNOSIS — N400 Enlarged prostate without lower urinary tract symptoms: Secondary | ICD-10-CM | POA: Diagnosis not present

## 2014-08-17 DIAGNOSIS — J449 Chronic obstructive pulmonary disease, unspecified: Secondary | ICD-10-CM

## 2014-08-17 DIAGNOSIS — G5791 Unspecified mononeuropathy of right lower limb: Secondary | ICD-10-CM | POA: Diagnosis not present

## 2014-08-17 DIAGNOSIS — Z0000 Encounter for general adult medical examination without abnormal findings: Secondary | ICD-10-CM

## 2014-08-17 NOTE — Progress Notes (Signed)
Subjective:    Patient ID: Charles Golden, male    DOB: [birth year], 81 y.o.   MRN: [id number]  HPI Pt here for follow up and management of chronic medical problems which includes hyperlipidemia and COPD. He is taking medications regularly. The patient complains of some congestion and sore throat at times. Since the patient was here back in November he developed a severe bronchitis and was treated with antibiotics and prednisone. He was treated with antibiotics on 2 occasions. He subsequently developed diarrhea and the gastroenterologist finally got this settled back down his bowel movements are now normal. He complains of still having a lot of headaches and he has seen Dr. Octavio Manns about this in the past and plans to go back and see him again soon.        Patient Active Problem List   Diagnosis Date Noted  . Acute medial meniscal tear 07/20/2014  . COPD (chronic obstructive pulmonary disease) 12/08/2013  . Small bowel volvulus 08/05/2013  . Chronic headache 08/05/2013  . Left inguinal hernia 08/05/2013  . Incisional hernia, without obstruction or gangrene 08/05/2013  . Bronchiectasis without acute exacerbation 06/22/2013  . Headache(784.0) 05/01/2013  . Obstructive chronic bronchitis without exacerbation 04/30/2013  . Left hip pain 04/29/2013  . BPH (benign prostatic hyperplasia) 01/05/2013  . IBS (irritable bowel syndrome) 09/03/2012  . Chronic rhinosinusitis 09/03/2012  . EMPHYSEMA 03/21/2010   Outpatient Encounter Prescriptions as of 08/17/2014  Medication Sig  . aspirin 81 MG tablet Take 81 mg by mouth daily.    Marland Kitchen aspirin-acetaminophen-caffeine (EXCEDRIN MIGRAINE) 250-250-65 MG per tablet Take 1 tablet by mouth every 6 (six) hours as needed for headache.  Marland Kitchen azelastine (ASTELIN) 0.1 % nasal spray PLACE 1 SPRAY INTO THE NOSE 1 DAY OR 1 DOSE. USE IN EACH NOSTRIL AS DIRECTED  . budesonide-formoterol (SYMBICORT) 160-4.5 MCG/ACT inhaler Inhale 2 puffs into the lungs 2 (two) times  daily.  . Cholecalciferol (VITAMIN D3) 2000 UNITS TABS Take 1 tablet by mouth daily.  Marland Kitchen doxazosin (CARDURA) 4 MG tablet Take 4 mg by mouth at bedtime.    . fluticasone (FLONASE) 50 MCG/ACT nasal spray PLACE 1 SPRAY INTO THE NOSE DAILY.  Marland Kitchen gabapentin (NEURONTIN) 300 MG capsule Take 600 mg by mouth daily.  Marland Kitchen guaiFENesin (MUCINEX) 600 MG 12 hr tablet Take 600 mg by mouth 2 (two) times daily.   Marland Kitchen latanoprost (XALATAN) 0.005 % ophthalmic solution Place 1 drop into the left eye at bedtime.   . Probiotic Product (ALIGN) 4 MG CAPS Take 1 tablet by mouth daily.  . vitamin B-12 (CYANOCOBALAMIN) 1000 MCG tablet Take 1,000 mcg by mouth daily.  . [DISCONTINUED] HYDROcodone-acetaminophen (NORCO) 5-325 MG per tablet Take 1-2 tablets by mouth every 6 (six) hours as needed for moderate pain.  . [DISCONTINUED] Loperamide HCl (IMODIUM PO) Take 1 tablet by mouth every 8 (eight) hours as needed (diahhrea).   . [DISCONTINUED] methocarbamol (ROBAXIN) 500 MG tablet Take 1 tablet (500 mg total) by mouth 4 (four) times daily. As needed for muscle spasm    Review of Systems  Constitutional: Negative.   HENT: Positive for congestion (some sore throat at times ).   Eyes: Negative.   Respiratory: Negative.   Cardiovascular: Negative.   Gastrointestinal: Negative.   Endocrine: Negative.   Genitourinary: Negative.   Musculoskeletal: Negative.   Skin: Negative.   Allergic/Immunologic: Negative.   Neurological: Negative.   Hematological: Negative.   Psychiatric/Behavioral: Negative.        Objective:   Physical Exam  Constitutional: He is oriented to person, place, and time. He appears well-developed and well-nourished. No distress.  HENT:  Head: Normocephalic and atraumatic.  Right Ear: External ear normal.  Left Ear: External ear normal.  Nose: Nose normal.  Mouth/Throat: Oropharynx is clear and moist. No oropharyngeal exudate.  Patient wears bilateral hearing aids and still has problems hearing. He has  nasal congestion bilaterally but the throat is clear. Both TMs and ear canals were clear.  Eyes: Conjunctivae and EOM are normal. Pupils are equal, round, and reactive to light. Right eye exhibits no discharge. Left eye exhibits no discharge. No scleral icterus.  Neck: Normal range of motion. Neck supple. No thyromegaly present.  No anterior cervical adenopathy  Cardiovascular: Normal rate, regular rhythm and normal heart sounds.   No murmur heard. At 84/m  Pulmonary/Chest: Effort normal and breath sounds normal. No respiratory distress. He has no wheezes. He has no rales. He exhibits no tenderness.  Clear anteriorly and posteriorly  Abdominal: Soft. Bowel sounds are normal. He exhibits no mass. There is no tenderness. There is no rebound and no guarding.  The patient has a large ventral incisional hernia. He has some soreness in the left inguinal area and no hernia was palpated there.  Musculoskeletal: Normal range of motion. He exhibits no edema.  Lymphadenopathy:    He has no cervical adenopathy.  Neurological: He is alert and oriented to person, place, and time. He has normal reflexes.  Skin: Skin is warm and dry. No rash noted.  Psychiatric: He has a normal mood and affect. His behavior is normal. Judgment and thought content normal.  Nursing note and vitals reviewed.  BP 129/70 mmHg  Pulse 89  Temp(Src) 97.4 F (36.3 C) (Oral)  Ht 5' 10" (1.778 m)  Wt 180 lb 9.6 oz (81.92 kg)  BMI 25.91 kg/m2        Assessment & Plan:  1. Vitamin D deficiency -Continue current treatment pending results of upcoming lab work - POCT CBC; Future - Vit D  25 hydroxy (rtn osteoporosis monitoring); Future  2. BPH (benign prostatic hyperplasia) -Continue follow-up with Dr. Jeffie Pollock - POCT CBC; Future  3. Chronic obstructive pulmonary disease, unspecified COPD, unspecified chronic bronchitis type -Continue Symbicort 2 puffs twice daily and rinse mouth after using -Continue with Mucinex maximum  strength 1 twice daily with a large glass of water - POCT CBC; Future  4. Neuropathy involving both lower extremities -Continue with gabapentin - POCT CBC; Future  5. Elevated LDL cholesterol level -Return to clinic for fasting lab work - POCT CBC; Future - BMP8+EGFR; Future - Hepatic function panel; Future - NMR, lipoprofile; Future  6. Health care maintenance - POCT CBC; Future - BMP8+EGFR; Future - Hepatic function panel; Future  Patient Instructions                       Medicare Annual Wellness Visit  [facility] and the medical providers at [facility] strive to bring you the best medical care.  In doing so we not only want to address your current medical conditions and concerns but also to detect new conditions early and prevent illness, disease and health-related problems.    Medicare offers a yearly Wellness Visit which allows our clinical staff to assess your need for preventative services including immunizations, lifestyle education, counseling to decrease risk of preventable diseases and screening for fall risk and other medical concerns.    This visit is provided free of charge (  no copay) for all Medicare recipients. The clinical pharmacists at [facility] have begun to conduct these Wellness Visits which will also include a thorough review of all your medications.    As you primary medical provider recommend that you make an appointment for your Annual Wellness Visit if you have not done so already this year.  You may set up this appointment before you leave today or you may call back ([phone]) and schedule an appointment.  Please make sure when you call that you mention that you are scheduling your Annual Wellness Visit with the clinical pharmacist so that the appointment may be made for the proper length of time.     Continue current medications. Continue good therapeutic lifestyle changes which include good diet and  exercise. Fall precautions discussed with patient. If an FOBT was given today- please return it to our front desk. If you are over 63 years old - you may need Prevnar 70 or the adult Pneumonia vaccine.  Flu Shots are still available at our office. If you still haven't had one please call to set up a nurse visit to get one.   After your visit with [place] today you will receive a survey in the mail or online from [employer] regarding your care with [place]. Please take a moment to fill this out. Your feedback is very important to [place] as you can help [place] better understand your patient needs as well as improve your experience and satisfaction. WE CARE ABOUT YOU!!!   The patient should continue follow-up for his knee pain with his orthopedic surgeon He should also follow-up for his headache with Dr. Melton Alar He should continue to drink plenty of fluids and use his current inhalers regularly He should take the Mucinex maximum strength, blue and white in color, 1 twice daily with a large glass of water    return the FOBT and return to the clinic for fasting lab work.  Arrie Senate MD

## 2014-08-17 NOTE — Patient Instructions (Addendum)
Medicare Annual Wellness Visit  [facility] and the medical providers at [facility] strive to bring you the best medical care.  In doing so we not only want to address your current medical conditions and concerns but also to detect new conditions early and prevent illness, disease and health-related problems.    Medicare offers a yearly Wellness Visit which allows our clinical staff to assess your need for preventative services including immunizations, lifestyle education, counseling to decrease risk of preventable diseases and screening for fall risk and other medical concerns.    This visit is provided free of charge (no copay) for all Medicare recipients. The clinical pharmacists at [facility] have begun to conduct these Wellness Visits which will also include a thorough review of all your medications.    As you primary medical provider recommend that you make an appointment for your Annual Wellness Visit if you have not done so already this year.  You may set up this appointment before you leave today or you may call back ([phone]) and schedule an appointment.  Please make sure when you call that you mention that you are scheduling your Annual Wellness Visit with the clinical pharmacist so that the appointment may be made for the proper length of time.     Continue current medications. Continue good therapeutic lifestyle changes which include good diet and exercise. Fall precautions discussed with patient. If an FOBT was given today- please return it to our front desk. If you are over 58 years old - you may need Prevnar 49 or the adult Pneumonia vaccine.  Flu Shots are still available at our office. If you still haven't had one please call to set up a nurse visit to get one.   After your visit with [place] today you will receive a survey in the mail or online from [employer] regarding your care with [place]. Please take a moment to  fill this out. Your feedback is very important to [place] as you can help [place] better understand your patient needs as well as improve your experience and satisfaction. WE CARE ABOUT YOU!!!   The patient should continue follow-up for his knee pain with his orthopedic surgeon He should also follow-up for his headache with Dr. Melton Alar He should continue to drink plenty of fluids and use his current inhalers regularly He should take the Mucinex maximum strength, blue and white in color, 1 twice daily with a large glass of water Return the FOBT Return to clinic for fasting lab work

## 2014-08-19 DIAGNOSIS — M25562 Pain in left knee: Secondary | ICD-10-CM | POA: Diagnosis not present

## 2014-08-19 DIAGNOSIS — Z4789 Encounter for other orthopedic aftercare: Secondary | ICD-10-CM | POA: Diagnosis not present

## 2014-08-19 DIAGNOSIS — S83232D Complex tear of medial meniscus, current injury, left knee, subsequent encounter: Secondary | ICD-10-CM | POA: Diagnosis not present

## 2014-08-20 DIAGNOSIS — G44221 Chronic tension-type headache, intractable: Secondary | ICD-10-CM | POA: Diagnosis not present

## 2014-08-20 DIAGNOSIS — G4441 Drug-induced headache, not elsewhere classified, intractable: Secondary | ICD-10-CM | POA: Diagnosis not present

## 2014-08-23 ENCOUNTER — Other Ambulatory Visit (INDEPENDENT_AMBULATORY_CARE_PROVIDER_SITE_OTHER): Payer: Medicare Other

## 2014-08-23 DIAGNOSIS — N400 Enlarged prostate without lower urinary tract symptoms: Secondary | ICD-10-CM | POA: Diagnosis not present

## 2014-08-23 DIAGNOSIS — E559 Vitamin D deficiency, unspecified: Secondary | ICD-10-CM

## 2014-08-23 DIAGNOSIS — G5793 Unspecified mononeuropathy of bilateral lower limbs: Secondary | ICD-10-CM

## 2014-08-23 DIAGNOSIS — Z0000 Encounter for general adult medical examination without abnormal findings: Secondary | ICD-10-CM | POA: Diagnosis not present

## 2014-08-23 DIAGNOSIS — E7800 Pure hypercholesterolemia, unspecified: Secondary | ICD-10-CM

## 2014-08-23 DIAGNOSIS — G5792 Unspecified mononeuropathy of left lower limb: Secondary | ICD-10-CM | POA: Diagnosis not present

## 2014-08-23 DIAGNOSIS — J449 Chronic obstructive pulmonary disease, unspecified: Secondary | ICD-10-CM

## 2014-08-23 DIAGNOSIS — G5791 Unspecified mononeuropathy of right lower limb: Secondary | ICD-10-CM | POA: Diagnosis not present

## 2014-08-23 DIAGNOSIS — Z1212 Encounter for screening for malignant neoplasm of rectum: Secondary | ICD-10-CM

## 2014-08-23 LAB — POCT CBC
Granulocyte percent: 65.8 %G (ref 37–80)
HEMATOCRIT: 43 % — AB (ref 43.5–53.7)
Hemoglobin: 13.3 g/dL — AB (ref 14.1–18.1)
LYMPH, POC: 2.3 (ref 0.6–3.4)
MCH, POC: 28.4 pg (ref 27–31.2)
MCHC: 30.9 g/dL — AB (ref 31.8–35.4)
MCV: 91.8 fL (ref 80–97)
MPV: 7.5 fL (ref 0–99.8)
POC Granulocyte: 4.9 (ref 2–6.9)
POC LYMPH PERCENT: 30.2 %L (ref 10–50)
Platelet Count, POC: 243 10*3/uL (ref 142–424)
RBC: 4.68 M/uL — AB (ref 4.69–6.13)
RDW, POC: 14.4 %
WBC: 7.5 10*3/uL (ref 4.6–10.2)

## 2014-08-23 NOTE — Progress Notes (Signed)
Lab only 

## 2014-08-23 NOTE — Addendum Note (Signed)
Addended by: Pollyann Kennedy F on: 08/23/2014 10:43 AM   Modules accepted: Orders

## 2014-08-24 LAB — NMR, LIPOPROFILE
Cholesterol: 147 mg/dL (ref 100–199)
HDL Cholesterol by NMR: 52 mg/dL (ref >39)
HDL PARTICLE NUMBER: 30.5 umol/L (ref >=30.5)
LDL PARTICLE NUMBER: 891 nmol/L (ref <1000)
LDL Size: 20.8 nm (ref >20.5)
LDL-C: 79 mg/dL (ref 0–99)
LP-IR SCORE: 39 (ref <=45)
SMALL LDL PARTICLE NUMBER: 340 nmol/L (ref <=527)
Triglycerides by NMR: 82 mg/dL (ref 0–149)

## 2014-08-24 LAB — HEPATIC FUNCTION PANEL
ALK PHOS: 85 IU/L (ref 39–117)
ALT: 11 IU/L (ref 0–44)
AST: 13 IU/L (ref 0–40)
Albumin: 3.6 g/dL (ref 3.5–4.7)
BILIRUBIN, DIRECT: 0.1 mg/dL (ref 0.00–0.40)
Bilirubin Total: 0.4 mg/dL (ref 0.0–1.2)
Total Protein: 5.6 g/dL — ABNORMAL LOW (ref 6.0–8.5)

## 2014-08-24 LAB — BMP8+EGFR
BUN/Creatinine Ratio: 11 (ref 10–22)
BUN: 15 mg/dL (ref 8–27)
CALCIUM: 8.9 mg/dL (ref 8.6–10.2)
CO2: 22 mmol/L (ref 18–29)
Chloride: 104 mmol/L (ref 97–108)
Creatinine, Ser: 1.32 mg/dL — ABNORMAL HIGH (ref 0.76–1.27)
GFR calc non Af Amer: 49 mL/min/{1.73_m2} — ABNORMAL LOW (ref >59)
GFR, EST AFRICAN AMERICAN: 56 mL/min/{1.73_m2} — AB (ref >59)
GLUCOSE: 93 mg/dL (ref 65–99)
Potassium: 4.6 mmol/L (ref 3.5–5.2)
SODIUM: 142 mmol/L (ref 134–144)

## 2014-08-24 LAB — VITAMIN D 25 HYDROXY (VIT D DEFICIENCY, FRACTURES): VIT D 25 HYDROXY: 44 ng/mL (ref 30.0–100.0)

## 2014-08-25 LAB — FECAL OCCULT BLOOD, IMMUNOCHEMICAL: FECAL OCCULT BLD: NEGATIVE

## 2014-09-14 DIAGNOSIS — S83252D Bucket-handle tear of lateral meniscus, current injury, left knee, subsequent encounter: Secondary | ICD-10-CM | POA: Diagnosis not present

## 2014-09-14 DIAGNOSIS — M25562 Pain in left knee: Secondary | ICD-10-CM | POA: Diagnosis not present

## 2014-09-14 DIAGNOSIS — Z4789 Encounter for other orthopedic aftercare: Secondary | ICD-10-CM | POA: Diagnosis not present

## 2014-09-28 DIAGNOSIS — J310 Chronic rhinitis: Secondary | ICD-10-CM | POA: Diagnosis not present

## 2014-09-28 DIAGNOSIS — G44229 Chronic tension-type headache, not intractable: Secondary | ICD-10-CM | POA: Diagnosis not present

## 2014-10-12 DIAGNOSIS — M1712 Unilateral primary osteoarthritis, left knee: Secondary | ICD-10-CM | POA: Diagnosis not present

## 2014-10-12 DIAGNOSIS — S83232D Complex tear of medial meniscus, current injury, left knee, subsequent encounter: Secondary | ICD-10-CM | POA: Diagnosis not present

## 2014-10-12 DIAGNOSIS — Z4789 Encounter for other orthopedic aftercare: Secondary | ICD-10-CM | POA: Diagnosis not present

## 2014-10-27 DIAGNOSIS — M1712 Unilateral primary osteoarthritis, left knee: Secondary | ICD-10-CM | POA: Diagnosis not present

## 2014-11-03 DIAGNOSIS — M1712 Unilateral primary osteoarthritis, left knee: Secondary | ICD-10-CM | POA: Diagnosis not present

## 2014-11-09 DIAGNOSIS — G44229 Chronic tension-type headache, not intractable: Secondary | ICD-10-CM | POA: Diagnosis not present

## 2014-11-10 DIAGNOSIS — N401 Enlarged prostate with lower urinary tract symptoms: Secondary | ICD-10-CM | POA: Diagnosis not present

## 2014-11-10 DIAGNOSIS — N138 Other obstructive and reflux uropathy: Secondary | ICD-10-CM | POA: Diagnosis not present

## 2014-11-10 DIAGNOSIS — R3912 Poor urinary stream: Secondary | ICD-10-CM | POA: Diagnosis not present

## 2014-11-11 DIAGNOSIS — M1712 Unilateral primary osteoarthritis, left knee: Secondary | ICD-10-CM | POA: Diagnosis not present

## 2014-12-02 ENCOUNTER — Ambulatory Visit (INDEPENDENT_AMBULATORY_CARE_PROVIDER_SITE_OTHER): Payer: Medicare Other | Admitting: *Deleted

## 2014-12-02 ENCOUNTER — Encounter: Payer: Self-pay | Admitting: *Deleted

## 2014-12-02 VITALS — BP 103/65 | HR 67 | Ht 70.25 in | Wt 182.0 lb

## 2014-12-02 DIAGNOSIS — Z0000 Encounter for general adult medical examination without abnormal findings: Secondary | ICD-10-CM

## 2014-12-02 NOTE — Patient Instructions (Signed)
Try to increase activity level as tolerated by knee pain. Try some chair exercises with light hand weights. See handout. Keep follow up appointments.  Ask Dr Wynelle Link about pain control for knee.  Eat a balanced diet with lean proteins, fruits, and vegetables.  Continue current medications.    Health Maintenance A healthy lifestyle and preventative care can promote health and wellness.  Maintain regular health, dental, and eye exams.  Eat a healthy diet. Foods like vegetables, fruits, whole grains, low-fat dairy products, and lean protein foods contain the nutrients you need and are low in calories. Decrease your intake of foods high in solid fats, added sugars, and salt. Get information about a proper diet from your health care provider, if necessary.  Regular physical exercise is one of the most important things you can do for your health. Most adults should get at least 150 minutes of moderate-intensity exercise (any activity that increases your heart rate and causes you to sweat) each week. In addition, most adults need muscle-strengthening exercises on 2 or more days a week.   Maintain a healthy weight. The body mass index (BMI) is a screening tool to identify possible weight problems. It provides an estimate of body fat based on height and weight. Your health care provider can find your BMI and can help you achieve or maintain a healthy weight. For males 20 years and older:  A BMI below 18.5 is considered underweight.  A BMI of 18.5 to 24.9 is normal.  A BMI of 25 to 29.9 is considered overweight.  A BMI of 30 and above is considered obese.  Maintain normal blood lipids and cholesterol by exercising and minimizing your intake of saturated fat. Eat a balanced diet with plenty of fruits and vegetables. Blood tests for lipids and cholesterol should begin at age 91 and be repeated every 5 years. If your lipid or cholesterol levels are high, you are over age 71, or you are at high risk  for heart disease, you may need your cholesterol levels checked more frequently.Ongoing high lipid and cholesterol levels should be treated with medicines if diet and exercise are not working.  If you smoke, find out from your health care provider how to quit. If you do not use tobacco, do not start.  Lung cancer screening is recommended for adults aged 11-80 years who are at high risk for developing lung cancer because of a history of smoking. A yearly low-dose CT scan of the lungs is recommended for people who have at least a 30-pack-year history of smoking and are current smokers or have quit within the past 15 years. A pack year of smoking is smoking an average of 1 pack of cigarettes a day for 1 year (for example, a 30-pack-year history of smoking could mean smoking 1 pack a day for 30 years or 2 packs a day for 15 years). Yearly screening should continue until the smoker has stopped smoking for at least 15 years. Yearly screening should be stopped for people who develop a health problem that would prevent them from having lung cancer treatment.  If you choose to drink alcohol, do not have more than 2 drinks per day. One drink is considered to be 12 oz (360 mL) of beer, 5 oz (150 mL) of wine, or 1.5 oz (45 mL) of liquor.  Avoid the use of street drugs. Do not share needles with anyone. Ask for help if you need support or instructions about stopping the use of drugs.  High  blood pressure causes heart disease and increases the risk of stroke. Blood pressure should be checked at least every 1-2 years. Ongoing high blood pressure should be treated with medicines if weight loss and exercise are not effective.  If you are 49-43 years old, ask your health care provider if you should take aspirin to prevent heart disease.  Diabetes screening involves taking a blood sample to check your fasting blood sugar level. This should be done once every 3 years after age 46 if you are at a normal weight and without  risk factors for diabetes. Testing should be considered at a younger age or be carried out more frequently if you are overweight and have at least 1 risk factor for diabetes.  Colorectal cancer can be detected and often prevented. Most routine colorectal cancer screening begins at the age of 66 and continues through age 47. However, your health care provider may recommend screening at an earlier age if you have risk factors for colon cancer. On a yearly basis, your health care provider may provide home test kits to check for hidden blood in the stool. A small camera at the end of a tube may be used to directly examine the colon (sigmoidoscopy or colonoscopy) to detect the earliest forms of colorectal cancer. Talk to your health care provider about this at age 85 when routine screening begins. A direct exam of the colon should be repeated every 5-10 years through age 19, unless early forms of precancerous polyps or small growths are found.  People who are at an increased risk for hepatitis B should be screened for this virus. You are considered at high risk for hepatitis B if:  You were born in a country where hepatitis B occurs often. Talk with your health care provider about which countries are considered high risk.  Your parents were born in a high-risk country and you have not received a shot to protect against hepatitis B (hepatitis B vaccine).  You have HIV or AIDS.  You use needles to inject street drugs.  You live with, or have sex with, someone who has hepatitis B.  You are a man who has sex with other men (MSM).  You get hemodialysis treatment.  You take certain medicines for conditions like cancer, organ transplantation, and autoimmune conditions.  Hepatitis C blood testing is recommended for all people born from [birth year] through 1963 and any individual with known risk factors for hepatitis C.  Healthy men should no longer receive prostate-specific antigen (PSA) blood tests as part of  routine cancer screening. Talk to your health care provider about prostate cancer screening.  Testicular cancer screening is not recommended for adolescents or adult males who have no symptoms. Screening includes self-exam, a health care provider exam, and other screening tests. Consult with your health care provider about any symptoms you have or any concerns you have about testicular cancer.  Practice safe sex. Use condoms and avoid high-risk sexual practices to reduce the spread of sexually transmitted infections (STIs).  You should be screened for STIs, including gonorrhea and chlamydia if:  You are sexually active and are younger than 24 years.  You are older than 24 years, and your health care provider tells you that you are at risk for this type of infection.  Your sexual activity has changed since you were last screened, and you are at an increased risk for chlamydia or gonorrhea. Ask your health care provider if you are at risk.  If you are at  risk of being infected with HIV, it is recommended that you take a prescription medicine daily to prevent HIV infection. This is called pre-exposure prophylaxis (PrEP). You are considered at risk if:  You are a man who has sex with other men (MSM).  You are a heterosexual man who is sexually active with multiple partners.  You take drugs by injection.  You are sexually active with a partner who has HIV.  Talk with your health care provider about whether you are at high risk of being infected with HIV. If you choose to begin PrEP, you should first be tested for HIV. You should then be tested every 3 months for as long as you are taking PrEP.  Use sunscreen. Apply sunscreen liberally and repeatedly throughout the day. You should seek shade when your shadow is shorter than you. Protect yourself by wearing long sleeves, pants, a wide-brimmed hat, and sunglasses year round whenever you are outdoors.  Tell your health care provider of new moles  or changes in moles, especially if there is a change in shape or color. Also, tell your health care provider if a mole is larger than the size of a pencil eraser.  A one-time screening for abdominal aortic aneurysm (AAA) and surgical repair of large AAAs by ultrasound is recommended for men aged 60-75 years who are current or former smokers.  Stay current with your vaccines (immunizations). Document Released: 11/10/2007 Document Revised: 05/19/2013 Document Reviewed: 10/09/2010 [facility] Patient Information 2015 [place], [place]. This information is not intended to replace advice given to you by your health care provider. Make sure you discuss any questions you have with your health care provider.

## 2014-12-02 NOTE — Progress Notes (Signed)
Patient ID: [name], male   DOB: 09-23-1925, 81 y.o.   MRN: [id number]   Subjective:   Charles Golden is a 81 y.o. male who presents for an Initial Medicare Annual Wellness Visit. Charles Golden is married and lives at home with his wife. They have 3 daughters and 6 grandchildren. He is a retired Immunologist. He is active in his church and attends [employer] He is also participates with the [facility].   Review of Systems   Cardiac Risk Factors include: advanced age (>59men, >26 women);male gender;sedentary lifestyle  Musculoskeletal: Left knee pain despite surgery in 06/2014 and gel injections. He was given a knee brace that helps some. He isn't taking any pain medications. He has a follow up with Dr Wynelle Link next week.  Gastrointestinal: Incisional abdominal hernia. This has been evaluated by his surgeon and told that if wasn't causing complications then they would hold off on surgery.    Objective:    Today's Vitals   12/02/14 1139  BP: 103/65  Pulse: 67  Height: 5' 10.25" (1.784 m)  Weight: 182 lb (82.555 kg)    Current Medications (verified) Outpatient Encounter Prescriptions as of 12/02/2014  Medication Sig  . aspirin 81 MG tablet Take 81 mg by mouth daily.    Marland Kitchen azelastine (ASTELIN) 0.1 % nasal spray PLACE 1 SPRAY INTO THE NOSE 1 DAY OR 1 DOSE. USE IN EACH NOSTRIL AS DIRECTED  . budesonide-formoterol (SYMBICORT) 160-4.5 MCG/ACT inhaler Inhale 2 puffs into the lungs 2 (two) times daily.  . Cholecalciferol (VITAMIN D3) 2000 UNITS TABS Take 1 tablet by mouth daily.  Marland Kitchen doxazosin (CARDURA) 8 MG tablet Take 8 mg by mouth daily.  . fluticasone (FLONASE) 50 MCG/ACT nasal spray PLACE 1 SPRAY INTO THE NOSE DAILY.  Marland Kitchen gabapentin (NEURONTIN) 300 MG capsule Take 600 mg by mouth 3 (three) times daily.   Marland Kitchen guaiFENesin (MUCINEX) 600 MG 12 hr tablet Take 600 mg by mouth 2 (two) times daily.   Marland Kitchen latanoprost (XALATAN) 0.005 % ophthalmic solution Place 1 drop into the  left eye at bedtime.   . Probiotic Product (ALIGN) 4 MG CAPS Take 1 tablet by mouth daily.  . vitamin B-12 (CYANOCOBALAMIN) 1000 MCG tablet Take 1,000 mcg by mouth daily.  . [DISCONTINUED] aspirin-acetaminophen-caffeine (EXCEDRIN MIGRAINE) 250-250-65 MG per tablet Take 1 tablet by mouth every 6 (six) hours as needed for headache.  . [DISCONTINUED] doxazosin (CARDURA) 4 MG tablet Take 4 mg by mouth at bedtime.     No facility-administered encounter medications on file as of 12/02/2014.    Allergies (verified) Meperidine hcl   History: Past Medical History  Diagnosis Date  . IBS (irritable bowel syndrome)   . Enlarged prostate   . Sinus congestion   . Sciatic pain   . COPD (chronic obstructive pulmonary disease)   . Glucagonoma   . Shortness of breath   . HOH (hard of hearing)   . Hernia, incisional     at present   Past Surgical History  Procedure Laterality Date  . Hemorrhoid surgery    . Laparotomy N/A 09/04/2012    Procedure: EXPLORATORY LAPAROTOMY;  Surgeon: Donato Heinz, MD;  Location: [facility];  Service: General;  Laterality: N/A;  . Bowel resection N/A 09/04/2012    Procedure: SMALL BOWEL RESECTION;  Surgeon: Donato Heinz, MD;  Location: [facility];  Service: General;  Laterality: N/A;  Anastimosis  . Hernia repair Right 70's  . Inguinal hernia repair Left  09/14/2013    Procedure: LEFT INGUINAL HERNIORRHAPHY;  Surgeon: Jamesetta So, MD;  Location: [facility];  Service: General;  Laterality: Left;  . Insertion of mesh Left 09/14/2013    Procedure: INSERTION OF MESH;  Surgeon: Jamesetta So, MD;  Location: [facility];  Service: General;  Laterality: Left;  . Tonsillectomy    . Knee arthroscopy Left 07/21/2014    Procedure: LEFT KNEE ARTHROSCOPY WITH MEDIAL MENISCAL DEBRIDEMENT ;  Surgeon: Gearlean Alf, MD;  Location: [facility];  Service: Orthopedics;  Laterality: Left;   Family History  Problem Relation Age of Onset  . Asthma      cousin  . Heart disease Mother   . Colon cancer  Father    Social History   Occupational History  . Retired     Immunologist   Social History Main Topics  . Smoking status: Former Smoker -- 1.00 packs/day for 30 years    Types: Cigarettes    Quit date: 05/28/2006  . Smokeless tobacco: Never Used  . Alcohol Use: No  . Drug Use: No  . Sexual Activity: Yes    Birth Control/ Protection: None    Activities of Daily Living In your present state of health, do you have any difficulty performing the following activities: 12/02/2014 07/16/2014  Hearing? Tempie Donning  Vision? N N  Difficulty concentrating or making decisions? N N  Walking or climbing stairs? Y Y  Dressing or bathing? N N  Doing errands, shopping? N Y  Conservation officer, nature and eating ? N -  Using the Toilet? N -  In the past six months, have you accidently leaked urine? Y -  Do you have problems with loss of bowel control? N -  Managing your Medications? N -  Managing your Finances? N -  Housekeeping or managing your Housekeeping? N -  -Wears bilateral hearing aids -difficulty walking stairs due to knee pain. He does not have stairs in his home.  -Episodes of mild incontinence due to enlarge prostate. This is followed by Dr Jeffie Pollock.  Immunizations and Health Maintenance Immunization History  Administered Date(s) Administered  . Influenza,inj,Quad PF,36+ Mos 03/20/2013, 03/08/2014  . Pneumococcal Conjugate-13 12/08/2013  . Pneumococcal Polysaccharide-23 05/28/1997  . Td 05/29/2007  . Zoster 05/07/2006   There are no preventive care reminders to display for this patient.  Patient Care Team: Chipper Herb, MD as PCP - General (Family Medicine) Irine Seal, MD (Urology) Richmond Campbell, MD (Gastroenterology) Clent Jacks, MD (Ophthalmology) Elsie Stain, MD as Consulting Physician (Pulmonary Disease) Gaynelle Arabian, MD as Consulting Physician (Orthopedic Surgery) Michel Santee, MD as Consulting Physician (Neurology)  Indicate any recent [place] you may have  received from other than [facility] providers in the past year (date may be approximate). Eye Exam with Dr Katy Fitch in 06/2014. He is seen every 6 months.     Assessment:   This is a routine wellness examination for [employer]   Hearing/Vision screen Hearing deficit noted but no vision deficit noted during visit.   Dietary issues and exercise activities discussed: Patient played golf frequently up until about a year ago. Hasn't played since then due to knee pain and abd hernia. Suggested chair exercises and instructional handout given. Continue a balanced diet with lean proteins, fruits and vegetables.   Depression Screen Depression screen [facility] 2/9 12/02/2014 12/02/2014 08/17/2014 04/09/2014 12/08/2013  Decreased Interest 0 0 0 0 0  Down, Depressed, Hopeless 0 0 0 0 0  PHQ - 2 Score 0 0  0 0 0    Fall Risk Fall Risk  12/02/2014 08/17/2014 04/09/2014 12/08/2013 01/05/2013  Falls in the past year? No No No No No    Cognitive Function: MMSE - Mini Mental State Exam 12/02/2014  Orientation to time 5  Orientation to Place 5  Registration 3  Attention/ Calculation 5  Recall 3  Language- name 2 objects 2  Language- repeat 1  Language- follow 3 step command 3  Language- read & follow direction 1  Write a sentence 1  Copy design 1  Total score 30  No deficit noted  Screening Tests Health Maintenance  Topic Date Due  . PNA vac Low Risk Adult (2 of 2 - PPSV23) 12/09/2014  . INFLUENZA VACCINE  12/27/2014  . TETANUS/TDAP  01/26/2018  . COLONOSCOPY  06/28/2021  . ZOSTAVAX  Completed        Plan:  Move carefully to avoid falls. Chair exercises suggested and instructional handout given. Discuss management of knee pain with Dr Wynelle Link at visit next week.  Increase activity level as tolerated and continue to remain socially active.  Continue to eat a balanced diet with lean proteins, fruits and vegetables.  Keep regularly scheduled follow up appointments Continue current medications   During the  course of the visit Eliav was educated and counseled about the following appropriate screening and preventive services:   Vaccines to include Pneumoccal-up to date, Influenza-suggested annualy, Tdap-up to date, Zostavax-up to date  Electrocardiogram-Due at next visit  Colorectal cancer screening-FOBT done 08/13/14 and last colonoscopy 2013.   Cardiovascular disease screening-Lipids monitored at routine office visit  Diabetes screening-glucose monitored at routine office visit  Glaucoma screening-Followed by Dr Katy Fitch every 6 months  Nutrition counseling-balanced diet with lean proteins, fruits, and vegetables  Prostate cancer screening-sees Dr Jeffie Pollock  Patient Instructions (the written plan) were given to the patient.   Chong Sicilian, RN  12/02/2014

## 2014-12-09 DIAGNOSIS — M1712 Unilateral primary osteoarthritis, left knee: Secondary | ICD-10-CM | POA: Diagnosis not present

## 2014-12-20 ENCOUNTER — Ambulatory Visit (INDEPENDENT_AMBULATORY_CARE_PROVIDER_SITE_OTHER): Payer: Medicare Other | Admitting: Critical Care Medicine

## 2014-12-20 ENCOUNTER — Encounter: Payer: Self-pay | Admitting: Critical Care Medicine

## 2014-12-20 VITALS — BP 106/62 | HR 74 | Temp 98.4°F | Ht 70.5 in | Wt 185.2 lb

## 2014-12-20 DIAGNOSIS — J441 Chronic obstructive pulmonary disease with (acute) exacerbation: Secondary | ICD-10-CM | POA: Diagnosis not present

## 2014-12-20 MED ORDER — BUDESONIDE-FORMOTEROL FUMARATE 160-4.5 MCG/ACT IN AERO: 2.0000 | INHALATION_SPRAY | Freq: Two times a day (BID) | RESPIRATORY_TRACT | Status: DC

## 2014-12-20 MED ORDER — LEVOFLOXACIN 500 MG PO TABS: 500.0000 mg | ORAL_TABLET | Freq: Every day | ORAL | Status: DC

## 2014-12-20 MED ORDER — PREDNISONE 10 MG PO TABS: ORAL_TABLET | ORAL | Status: DC

## 2014-12-20 NOTE — Assessment & Plan Note (Signed)
Copd with acute exacerbation  Plan Take levaquin one daily for 5 days Take prednisone 10mg  Take 4 for two days three for two days two for two days one for two days Stay on symbicort, refills sent Return 4 months

## 2014-12-20 NOTE — Progress Notes (Signed)
Subjective:    Patient ID: Charles Golden, male    DOB: 03/14/1926, 81 y.o.   MRN: [id number]  HPI 12/20/2014 Chief Complaint  Patient presents with  . Follow-up    SOB worsening x 3-4 months.  Chest congestion with yellowish gray mucus.  No fever.   Pt notes slowly worse overtime.  Pt stays tired all the time.  Pt notes yellow gray mucus.  Notes mild GERD   Notes a sore throat   Pt denies any significant  nasal congestion, fever, chills, sweats, unintended weight loss, pleurtic or exertional chest pain, orthopnea PND, or leg swelling Pt denies any increase in rescue therapy over baseline, denies waking up needing it or having any early am or nocturnal exacerbations of coughing/wheezing/or dyspnea. Pt also denies any obvious fluctuation in symptoms with  weather or environmental change or other alleviating or aggravating factors   Current Medications, Allergies, Complete Past Medical History, Past Surgical History, Family History, and Social History were reviewed in [place] record per todays encounter:  12/20/2014  Review of Systems  Constitutional: Negative.   HENT: Positive for sore throat. Negative for ear pain, postnasal drip, rhinorrhea, sinus pressure, trouble swallowing and voice change.   Eyes: Negative.   Respiratory: Positive for cough, shortness of breath and wheezing. Negative for apnea, choking, chest tightness and stridor.   Cardiovascular: Negative.  Negative for chest pain, palpitations and leg swelling.  Gastrointestinal: Negative.  Negative for nausea, vomiting, abdominal pain and abdominal distention.  Genitourinary: Negative.   Musculoskeletal: Negative.  Negative for myalgias and arthralgias.  Skin: Negative.  Negative for rash.  Allergic/Immunologic: Negative.  Negative for environmental allergies and food allergies.  Neurological: Negative.  Negative for dizziness, syncope, weakness and headaches.  Hematological: Negative.  Negative for  adenopathy. Does not bruise/bleed easily.  Psychiatric/Behavioral: Negative.  Negative for sleep disturbance and agitation. The patient is not nervous/anxious.        Objective:   Physical Exam There were no vitals filed for this visit.  Gen: Pleasant, well-nourished, in no distress,  normal affect  ENT: No lesions,  mouth clear,  oropharynx clear, no postnasal drip  Neck: No JVD, no TMG, no carotid bruits  Lungs: No use of accessory muscles, no dullness to percussion,exp wheezes  Cardiovascular: RRR, heart sounds normal, no murmur or gallops, no peripheral edema  Abdomen: soft and NT, no HSM,  BS normal  Musculoskeletal: No deformities, no cyanosis or clubbing  Neuro: alert, non focal  Skin: Warm, no lesions or rashes  No results found.        Assessment & Plan:  I personally reviewed all images and lab data in the [facility] system as well as any outside material available during this office visit and agree with the  radiology impressions.   Obstructive chronic bronchitis with acute  exacerbation Copd with acute exacerbation  Plan Take levaquin one daily for 5 days Take prednisone 10mg  Take 4 for two days three for two days two for two days one for two days Stay on symbicort, refills sent Return 4 months    Charles Golden was seen today for follow-up.  Diagnoses and all orders for this visit:  Obstructive chronic bronchitis with exacerbation  Other orders -     budesonide-formoterol (SYMBICORT) 160-4.5 MCG/ACT inhaler; Inhale 2 puffs into the lungs 2 (two) times daily. -     levofloxacin (LEVAQUIN) 500 MG tablet; Take 1 tablet (500 mg total) by mouth daily. -  predniSONE (DELTASONE) 10 MG tablet; Take 4 for two days three for two days two for two days one for two days

## 2014-12-20 NOTE — Patient Instructions (Signed)
Take levaquin one daily for 5 days Take prednisone 10mg  Take 4 for two days three for two days two for two days one for two days Stay on symbicort, refills sent Return 4 months

## 2014-12-28 ENCOUNTER — Ambulatory Visit (INDEPENDENT_AMBULATORY_CARE_PROVIDER_SITE_OTHER): Payer: Medicare Other | Admitting: Family Medicine

## 2014-12-28 ENCOUNTER — Encounter: Payer: Self-pay | Admitting: Family Medicine

## 2014-12-28 VITALS — BP 126/73 | HR 78 | Temp 98.4°F | Ht 70.5 in | Wt 184.0 lb

## 2014-12-28 DIAGNOSIS — Z0000 Encounter for general adult medical examination without abnormal findings: Secondary | ICD-10-CM

## 2014-12-28 DIAGNOSIS — K432 Incisional hernia without obstruction or gangrene: Secondary | ICD-10-CM | POA: Diagnosis not present

## 2014-12-28 DIAGNOSIS — E559 Vitamin D deficiency, unspecified: Secondary | ICD-10-CM | POA: Diagnosis not present

## 2014-12-28 DIAGNOSIS — E7800 Pure hypercholesterolemia, unspecified: Secondary | ICD-10-CM

## 2014-12-28 DIAGNOSIS — J449 Chronic obstructive pulmonary disease, unspecified: Secondary | ICD-10-CM

## 2014-12-28 DIAGNOSIS — N400 Enlarged prostate without lower urinary tract symptoms: Secondary | ICD-10-CM | POA: Diagnosis not present

## 2014-12-28 NOTE — Progress Notes (Signed)
Subjective:    Patient ID: Charles Golden, male    DOB: 12-08-1925, 81 y.o.   MRN: [id number]  HPI  Pt here for follow up and management of chronic medical problems which includes hyperlipidemia and COPD.  Patient is taking prescriptions as directed. This patient has a history of chronic bronchitis and COPD, and chronic rhinosinusitis. The patient was recently seen the pulmonologist and just completed a course of prednisone and Levaquin. He is due to get lab work for routine purposes and will return to the office fasting for this. He still complains of some congestion and cough and hoarseness and slight sore throat.      Patient Active Problem List   Diagnosis Date Noted  . Acute medial meniscal tear 07/20/2014  . COPD (chronic obstructive pulmonary disease) 12/08/2013  . Small bowel volvulus 08/05/2013  . Chronic headache 08/05/2013  . Left inguinal hernia 08/05/2013  . Incisional hernia, without obstruction or gangrene 08/05/2013  . Bronchiectasis without acute exacerbation 06/22/2013  . Headache(784.0) 05/01/2013  . Obstructive chronic bronchitis without exacerbation 04/30/2013  . Left hip pain 04/29/2013  . BPH (benign prostatic hyperplasia) 01/05/2013  . IBS (irritable bowel syndrome) 09/03/2012  . Chronic rhinosinusitis 09/03/2012  . EMPHYSEMA 03/21/2010   Outpatient Encounter Prescriptions as of 12/28/2014  Medication Sig  . aspirin 81 MG tablet Take 81 mg by mouth daily.    Marland Kitchen azelastine (ASTELIN) 0.1 % nasal spray PLACE 1 SPRAY INTO THE NOSE 1 DAY OR 1 DOSE. USE IN EACH NOSTRIL AS DIRECTED  . budesonide-formoterol (SYMBICORT) 160-4.5 MCG/ACT inhaler Inhale 2 puffs into the lungs 2 (two) times daily.  . Cholecalciferol (VITAMIN D3) 2000 UNITS TABS Take 1 tablet by mouth daily.  . Cyanocobalamin (VITAMIN B-12) 5000 MCG SUBL Place 1 tablet under the tongue daily.  Marland Kitchen doxazosin (CARDURA) 8 MG tablet Take 4-8 mg by mouth daily.   . fluticasone (FLONASE) 50 MCG/ACT nasal spray  PLACE 1 SPRAY INTO THE NOSE DAILY.  Marland Kitchen guaiFENesin (MUCINEX) 600 MG 12 hr tablet Take 600 mg by mouth 2 (two) times daily.   Marland Kitchen latanoprost (XALATAN) 0.005 % ophthalmic solution Place 1 drop into the left eye at bedtime.   . Probiotic Product (ALIGN) 4 MG CAPS Take 1 tablet by mouth daily.  Marland Kitchen gabapentin (NEURONTIN) 300 MG capsule Take 600 mg by mouth 3 (three) times daily.   . [DISCONTINUED] levofloxacin (LEVAQUIN) 500 MG tablet Take 1 tablet (500 mg total) by mouth daily.  . [DISCONTINUED] predniSONE (DELTASONE) 10 MG tablet Take 4 for two days three for two days two for two days one for two days   No facility-administered encounter medications on file as of 12/28/2014.        Review of Systems  Constitutional: Negative.   HENT: Positive for congestion, sore throat and voice change (hoarseness).   Eyes: Negative.   Respiratory: Positive for cough (patient has cough and congestion, finished Levaquin and Prednisone recently).   Cardiovascular: Negative.   Gastrointestinal: Negative.   Endocrine: Negative.   Genitourinary: Negative.   Musculoskeletal: Negative.   Skin: Negative.   Allergic/Immunologic: Negative.   Neurological: Negative.   Hematological: Negative.   Psychiatric/Behavioral: Negative.        Objective:   Physical Exam  Constitutional: He is oriented to person, place, and time. He appears well-developed and well-nourished. No distress.  The patient is alert and looks much younger than his stated age of 75 years  HENT:  Head: Normocephalic and atraumatic.  Right Ear:  External ear normal.  Left Ear: External ear normal.  Nose: Nose normal.  Mouth/Throat: Oropharynx is clear and moist. No oropharyngeal exudate.  Eyes: Conjunctivae and EOM are normal. Pupils are equal, round, and reactive to light. Right eye exhibits no discharge. Left eye exhibits no discharge. No scleral icterus.  Neck: Normal range of motion. Neck supple. No thyromegaly present.  No carotid bruits  or anterior cervical adenopathy  Cardiovascular: Normal rate, regular rhythm and normal heart sounds.  Exam reveals no gallop and no friction rub.   No murmur heard. The heart had a regular rate and rhythm at 84/m  Pulmonary/Chest: Effort normal and breath sounds normal. No respiratory distress. He has no wheezes. He has no rales. He exhibits no tenderness.  No wheezes are congestion with deep breathing. There were some rhonchi with coughing. There was slight yellow tinged sputum.  Abdominal: Soft. Bowel sounds are normal. He exhibits distension. He exhibits no mass. There is no tenderness. There is no rebound and no guarding.  There is a large incisional hernia that is not tender to palpation. There is more prominent with standing.  Genitourinary:  This patient sees the urologist every 6 months.  Musculoskeletal: Normal range of motion. He exhibits no edema or tenderness.  Lymphadenopathy:    He has no cervical adenopathy.  Neurological: He is alert and oriented to person, place, and time. He has normal reflexes. No cranial nerve deficit.  Skin: Skin is warm and dry. No rash noted. No erythema. No pallor.  Psychiatric: He has a normal mood and affect. His behavior is normal. Judgment and thought content normal.  Nursing note and vitals reviewed.     BP 126/73 mmHg  Pulse 78  Temp(Src) 98.4 F (36.9 C) (Oral)  Ht 5' 10.5" (1.791 m)  Wt 184 lb (83.462 kg)  BMI 26.02 kg/m2     Assessment & Plan:  1. Vitamin D deficiency -Continue current treatment pending results of lab work - POCT CBC; Future - Vit D  25 hydroxy (rtn osteoporosis monitoring); Future  2. BPH (benign prostatic hyperplasia) -Continue follow-up with urology - POCT CBC; Future  3. Chronic obstructive pulmonary disease, unspecified COPD, unspecified chronic bronchitis type -Continue current treatment and follow-up with pulmonology as planned - POCT CBC; Future  4. Elevated LDL cholesterol level -Continue current  treatment and therapeutic lifestyle changes pending results of lab work - POCT CBC; Future - NMR, lipoprofile; Future - BMP8+EGFR; Future - Hepatic function panel; Future  5. Health care maintenance -Continue pulmonary hygiene and do not forget to get the flu shot this fall - POCT CBC; Future - BMP8+EGFR; Future - Hepatic function panel; Future  6. Incisional hernia, without obstruction or gangrene -Referred to general surgery for second opinion - Ambulatory referral to General Surgery  No orders of the defined types were placed in this encounter.   Patient Instructions                       Medicare Annual Wellness Visit  [facility] and the medical providers at [facility] strive to bring you the best medical care.  In doing so we not only want to address your current medical conditions and concerns but also to detect new conditions early and prevent illness, disease and health-related problems.    Medicare offers a yearly Wellness Visit which allows our clinical staff to assess your need for preventative services including immunizations, lifestyle education, counseling to decrease risk of preventable diseases  and screening for fall risk and other medical concerns.    This visit is provided free of charge (no copay) for all Medicare recipients. The clinical pharmacists at [facility] have begun to conduct these Wellness Visits which will also include a thorough review of all your medications.    As you primary medical provider recommend that you make an appointment for your Annual Wellness Visit if you have not done so already this year.  You may set up this appointment before you leave today or you may call back ([phone]) and schedule an appointment.  Please make sure when you call that you mention that you are scheduling your Annual Wellness Visit with the clinical pharmacist so that the appointment may be made for the proper length of  time.     Continue current medications. Continue good therapeutic lifestyle changes which include good diet and exercise. Fall precautions discussed with patient. If an FOBT was given today- please return it to our front desk. If you are over 43 years old - you may need Prevnar 71 or the adult Pneumonia vaccine.   After your visit with [place] today you will receive a survey in the mail or online from [employer] regarding your care with [place]. Please take a moment to fill this out. Your feedback is very important to [place] as you can help [place] better understand your patient needs as well as improve your experience and satisfaction. WE CARE ABOUT YOU!!!   Continue all medications Continue to drink plenty of fluids Continue Mucinex Continue follow-up with pulmonologist as planned Continue follow-up with urology We will arrange for you to have a visit with the general surgeon for second opinion regarding your incisional hernia   Arrie Senate MD

## 2014-12-28 NOTE — Patient Instructions (Addendum)
Medicare Annual Wellness Visit  [facility] and the medical providers at [facility] strive to bring you the best medical care.  In doing so we not only want to address your current medical conditions and concerns but also to detect new conditions early and prevent illness, disease and health-related problems.    Medicare offers a yearly Wellness Visit which allows our clinical staff to assess your need for preventative services including immunizations, lifestyle education, counseling to decrease risk of preventable diseases and screening for fall risk and other medical concerns.    This visit is provided free of charge (no copay) for all Medicare recipients. The clinical pharmacists at [facility] have begun to conduct these Wellness Visits which will also include a thorough review of all your medications.    As you primary medical provider recommend that you make an appointment for your Annual Wellness Visit if you have not done so already this year.  You may set up this appointment before you leave today or you may call back ([phone]) and schedule an appointment.  Please make sure when you call that you mention that you are scheduling your Annual Wellness Visit with the clinical pharmacist so that the appointment may be made for the proper length of time.     Continue current medications. Continue good therapeutic lifestyle changes which include good diet and exercise. Fall precautions discussed with patient. If an FOBT was given today- please return it to our front desk. If you are over 79 years old - you may need Prevnar 93 or the adult Pneumonia vaccine.   After your visit with [place] today you will receive a survey in the mail or online from [employer] regarding your care with [place]. Please take a moment to fill this out. Your feedback is very important to [place] as you can help [place] better understand your patient needs as well as  improve your experience and satisfaction. WE CARE ABOUT YOU!!!   Continue all medications Continue to drink plenty of fluids Continue Mucinex Continue follow-up with pulmonologist as planned Continue follow-up with urology We will arrange for you to have a visit with the general surgeon for second opinion regarding your incisional hernia

## 2014-12-31 ENCOUNTER — Other Ambulatory Visit: Payer: Self-pay | Admitting: Family Medicine

## 2015-01-06 ENCOUNTER — Other Ambulatory Visit: Payer: Medicare Other

## 2015-01-06 DIAGNOSIS — Z0000 Encounter for general adult medical examination without abnormal findings: Secondary | ICD-10-CM | POA: Diagnosis not present

## 2015-01-06 DIAGNOSIS — E7800 Pure hypercholesterolemia, unspecified: Secondary | ICD-10-CM

## 2015-01-06 DIAGNOSIS — J449 Chronic obstructive pulmonary disease, unspecified: Secondary | ICD-10-CM

## 2015-01-06 DIAGNOSIS — E559 Vitamin D deficiency, unspecified: Secondary | ICD-10-CM

## 2015-01-06 DIAGNOSIS — N400 Enlarged prostate without lower urinary tract symptoms: Secondary | ICD-10-CM

## 2015-01-07 DIAGNOSIS — K432 Incisional hernia without obstruction or gangrene: Secondary | ICD-10-CM | POA: Diagnosis not present

## 2015-01-07 LAB — BMP8+EGFR
BUN/Creatinine Ratio: 17 (ref 10–22)
BUN: 23 mg/dL (ref 8–27)
CALCIUM: 8.9 mg/dL (ref 8.6–10.2)
CO2: 23 mmol/L (ref 18–29)
Chloride: 104 mmol/L (ref 97–108)
Creatinine, Ser: 1.37 mg/dL — ABNORMAL HIGH (ref 0.76–1.27)
GFR calc Af Amer: 53 mL/min/{1.73_m2} — ABNORMAL LOW (ref >59)
GFR calc non Af Amer: 46 mL/min/{1.73_m2} — ABNORMAL LOW (ref >59)
GLUCOSE: 93 mg/dL (ref 65–99)
POTASSIUM: 4.8 mmol/L (ref 3.5–5.2)
Sodium: 142 mmol/L (ref 134–144)

## 2015-01-07 LAB — CBC WITH DIFFERENTIAL/PLATELET
BASOS ABS: 0 10*3/uL (ref 0.0–0.2)
Basos: 0 %
EOS (ABSOLUTE): 0.2 10*3/uL (ref 0.0–0.4)
EOS: 3 %
HEMATOCRIT: 43.5 % (ref 37.5–51.0)
Hemoglobin: 14 g/dL (ref 12.6–17.7)
Immature Grans (Abs): 0 10*3/uL (ref 0.0–0.1)
Immature Granulocytes: 0 %
LYMPHS ABS: 2.2 10*3/uL (ref 0.7–3.1)
Lymphs: 31 %
MCH: 29.9 pg (ref 26.6–33.0)
MCHC: 32.2 g/dL (ref 31.5–35.7)
MCV: 93 fL (ref 79–97)
MONOS ABS: 0.5 10*3/uL (ref 0.1–0.9)
Monocytes: 7 %
NEUTROS PCT: 59 %
Neutrophils Absolute: 4.1 10*3/uL (ref 1.4–7.0)
Platelets: 215 10*3/uL (ref 150–379)
RBC: 4.68 x10E6/uL (ref 4.14–5.80)
RDW: 13.7 % (ref 12.3–15.4)
WBC: 6.9 10*3/uL (ref 3.4–10.8)

## 2015-01-07 LAB — NMR, LIPOPROFILE
Cholesterol: 170 mg/dL (ref 100–199)
HDL Cholesterol by NMR: 58 mg/dL (ref >39)
HDL Particle Number: 32.2 umol/L (ref >=30.5)
LDL Particle Number: 927 nmol/L (ref <1000)
LDL Size: 21 nm (ref >20.5)
LDL-C: 98 mg/dL (ref 0–99)
LP-IR SCORE: 30 (ref <=45)
Small LDL Particle Number: 383 nmol/L (ref <=527)
Triglycerides by NMR: 71 mg/dL (ref 0–149)

## 2015-01-07 LAB — HEPATIC FUNCTION PANEL
ALBUMIN: 3.7 g/dL (ref 3.5–4.7)
ALT: 12 IU/L (ref 0–44)
AST: 16 IU/L (ref 0–40)
Alkaline Phosphatase: 90 IU/L (ref 39–117)
BILIRUBIN, DIRECT: 0.08 mg/dL (ref 0.00–0.40)
Bilirubin Total: 0.2 mg/dL (ref 0.0–1.2)
TOTAL PROTEIN: 5.8 g/dL — AB (ref 6.0–8.5)

## 2015-02-08 DIAGNOSIS — H04123 Dry eye syndrome of bilateral lacrimal glands: Secondary | ICD-10-CM | POA: Diagnosis not present

## 2015-02-08 DIAGNOSIS — Z961 Presence of intraocular lens: Secondary | ICD-10-CM | POA: Diagnosis not present

## 2015-02-08 DIAGNOSIS — H40053 Ocular hypertension, bilateral: Secondary | ICD-10-CM | POA: Diagnosis not present

## 2015-02-08 DIAGNOSIS — H26493 Other secondary cataract, bilateral: Secondary | ICD-10-CM | POA: Diagnosis not present

## 2015-02-15 ENCOUNTER — Encounter: Payer: Self-pay | Admitting: Family Medicine

## 2015-02-15 ENCOUNTER — Ambulatory Visit (INDEPENDENT_AMBULATORY_CARE_PROVIDER_SITE_OTHER): Payer: Medicare Other | Admitting: Family Medicine

## 2015-02-15 VITALS — BP 129/74 | HR 70 | Temp 97.2°F | Ht 70.5 in | Wt 183.2 lb

## 2015-02-15 DIAGNOSIS — B354 Tinea corporis: Secondary | ICD-10-CM | POA: Insufficient documentation

## 2015-02-15 MED ORDER — TERBINAFINE HCL 250 MG PO TABS: 250.0000 mg | ORAL_TABLET | Freq: Every day | ORAL | Status: DC

## 2015-02-15 NOTE — Progress Notes (Signed)
   HPI  Patient presents today here for evaluation of a groin rash  Patient splint is been there for 2-3 weeks and is itchy. It's on both legs and has steadily gotten worse. He's been using clotrimazole without any improvement.  He denies fever, chills, sweats, nausea, or loss of appetite. He states that he does not quite feel himself lately, but difficult to describe.  PMH: Smoking status noted ROS: Per HPI  Objective: BP 129/74 mmHg  Pulse 70  Temp(Src) 97.2 F (36.2 C) (Oral)  Ht 5' 10.5" (1.791 m)  Wt 183 lb 3.2 oz (83.099 kg)  BMI 25.91 kg/m2 Gen: NAD, alert, cooperative with exam HEENT: NCAT CV: RRR, good S1/S2, no murmur Resp: CTABL, no wheezes, non-labored Ext: No edema, warm Neuro: Alert and oriented, No gross deficits Skin: Inner thighs with lesions on both side, on the right he has 2 large circular erythematous rimmed lesions that are approximately 4 cm in diameter, on the left he has 3 similar sized and characterized lesions. Scalp lesion-  Right posterior scalp with very small approximately 4-5 mm in diameter pearly. slightly raised papule which is nontender to palpation  Assessment and plan:  # Tinea cruris, ringworm Since he is treated with a topical anti-fungal for 2 weeks without any improvement will go ahead and progress to treating with oral terbinafine. Discussed usual course of illness, if not improved after course would consider granuloma annulare and consider biopsy.  # Scalp lesion, possible basal cell carcinoma Recommended watchful waiting, recheck in 4 weeks if not improved. Would consider removal with a shave biopsy or dermatology referral given that it is inside his hairline   Meds ordered this encounter  Medications  . terbinafine (LAMISIL) 250 MG tablet    Sig: Take 1 tablet (250 mg total) by mouth daily.    Dispense:  14 tablet    Refill:  0    Laroy Apple, MD [facility] 02/15/2015, 11:53 AM

## 2015-02-15 NOTE — Patient Instructions (Signed)
Great to meet you!  It looks likeyou have ringworm which is a fungal infection of the skin. Since you have used good medicine for 2 weeks I will treat you with oral antifungal for 2 weeks  Body Ringworm Ringworm (tinea corporis) is a fungal infection of the skin on the body. This infection is not caused by worms, but is actually caused by a fungus. Fungus normally lives on the top of your skin and can be useful. However, in the case of ringworms, the fungus grows out of control and causes a skin infection. It can involve any area of skin on the body and can spread easily from one person to another (contagious). Ringworm is a common problem for children, but it can affect adults as well. Ringworm is also often found in athletes, especially wrestlers who share equipment and mats.  CAUSES  Ringworm of the body is caused by a fungus called dermatophyte. It can spread by:  Touchingother people who are infected.  Touchinginfected pets.  Touching or sharingobjects that have been in contact with the infected person or pet (hats, combs, towels, clothing, sports equipment). SYMPTOMS   Itchy, raised red spots and bumps on the skin.  Ring-shaped rash.  Redness near the border of the rash with a clear center.  Dry and scaly skin on or around the rash. Not every person develops a ring-shaped rash. Some develop only the red, scaly patches. DIAGNOSIS  Most often, ringworm can be diagnosed by performing a skin exam. Your caregiver may choose to take a skin scraping from the affected area. The sample will be examined under the microscope to see if the fungus is present.  TREATMENT  Body ringworm may be treated with a topical antifungal cream or ointment. Sometimes, an antifungal shampoo that can be used on your body is prescribed. You may be prescribed antifungal medicines to take by mouth if your ringworm is severe, keeps coming back, or lasts a long time.  HOME CARE INSTRUCTIONS   Only take  over-the-counter or prescription medicines as directed by your caregiver.  Wash the infected area and dry it completely before applying yourcream or ointment.  When using antifungal shampoo to treat the ringworm, leave the shampoo on the body for 3-5 minutes before rinsing.   Wear loose clothing to stop clothes from rubbing and irritating the rash.  Wash or change your bed sheets every night while you have the rash.  Have your pet treated by your veterinarian if it has the same infection. To prevent ringworm:   Practice good hygiene.  Wear sandals or shoes in public places and showers.  Do not share personal items with others.  Avoid touching red patches of skin on other people.  Avoid touching pets that have bald spots or wash your hands after doing so. SEEK MEDICAL CARE IF:   Your rash continues to spread after 7 days of treatment.  Your rash is not gone in 4 weeks.  The area around your rash becomes red, warm, tender, and swollen. Document Released: 05/11/2000 Document Revised: 02/06/2012 Document Reviewed: 11/26/2011 [facility] Patient Information 2015 [place], [place]. This information is not intended to replace advice given to you by your health care provider. Make sure you discuss any questions you have with your health care provider.

## 2015-03-15 ENCOUNTER — Ambulatory Visit (INDEPENDENT_AMBULATORY_CARE_PROVIDER_SITE_OTHER): Payer: Medicare Other

## 2015-03-15 DIAGNOSIS — Z23 Encounter for immunization: Secondary | ICD-10-CM

## 2015-03-23 ENCOUNTER — Other Ambulatory Visit: Payer: Self-pay | Admitting: Family Medicine

## 2015-04-11 ENCOUNTER — Other Ambulatory Visit: Payer: Medicare Other

## 2015-04-11 ENCOUNTER — Encounter: Payer: Self-pay | Admitting: Pulmonary Disease

## 2015-04-11 ENCOUNTER — Ambulatory Visit (INDEPENDENT_AMBULATORY_CARE_PROVIDER_SITE_OTHER): Payer: Medicare Other | Admitting: Pulmonary Disease

## 2015-04-11 VITALS — BP 122/62 | HR 81 | Ht 70.5 in | Wt 181.0 lb

## 2015-04-11 DIAGNOSIS — K219 Gastro-esophageal reflux disease without esophagitis: Secondary | ICD-10-CM | POA: Insufficient documentation

## 2015-04-11 DIAGNOSIS — J449 Chronic obstructive pulmonary disease, unspecified: Secondary | ICD-10-CM

## 2015-04-11 DIAGNOSIS — R918 Other nonspecific abnormal finding of lung field: Secondary | ICD-10-CM

## 2015-04-11 MED ORDER — RANITIDINE HCL 150 MG PO TABS: 150.0000 mg | ORAL_TABLET | Freq: Every day | ORAL | Status: DC

## 2015-04-11 NOTE — Patient Instructions (Addendum)
1. I'm checking you for the genetic form of COPD and also checking your immune system. I'll call if these tests are abnormal. 2. I'm repeating a CT of your chest to follow-up on the spots in your left lung. 3. We will repeat a breathing test & walking test at your next appointment. 4. I'm starting you on Zantac at night to take before bed. Try to avoid eating within 2 hours of bedtime. 5. We are checking a culture of your sputum to make sure you don't have any infection causing your cough. 6. I will see you back in 3 months but call me if you have any new problems because I would be happy to see you sooner if needed.

## 2015-04-11 NOTE — Progress Notes (Signed)
Subjective:    Patient ID: Charles Golden, male    DOB: 06/01/1925, 81 y.o.   MRN: [id number]  C.C.:  Follow-up for Chronic Bronchitis/Mild Bronchiectasis, Lingula Nodules, & GERD.  HPI Chronic Bronchitis/Mild Bronchiectasis:  Last seen in July and treated for acute exacerbation with a course of Levaquin. He reports he has intermittent coughing that is quite frequent at times. It is productive of a "gray" mucus. He does have wheezing at times. He does have some exertional dyspnea. He reports he gets bronchitis 1-2 times a year that requires treatment with antibiotics. He reports he is using his Symbicort inhaler at least once daily.  Lingula Nodules: Nodules seen on CT imaging from 2004 with at least some calcification & subcentimeter in size.  GERD:  He reports his reflux has worsened over the last month. He is using Tums & Rolaids. He does have dyspepsia at times.  Review of Systems He reports increasing fatigue lately. He has also noted some mild right upper quadrant pain. He denies any chills or sweats. He reports occasional subjective fever but his temperature is no higher than 99.60F.  Allergies  Allergen Reactions  . Demerol [Meperidine]   . Meperidine Hcl Other (See Comments)    Unknown     Current Outpatient Prescriptions on File Prior to Visit  Medication Sig Dispense Refill  . aspirin 81 MG tablet Take 81 mg by mouth daily.      Marland Kitchen azelastine (ASTELIN) 0.1 % nasal spray PLACE 1 SPRAY INTO THE NOSE 1 DAY OR 1 DOSE. USE IN EACH NOSTRIL AS DIRECTED 30 mL 4  . azelastine (ASTELIN) 0.1 % nasal spray PLACE 1 SPRAY INTO THE NOSE 1 DAY OR 1 DOSE. USE IN EACH NOSTRIL AS DIRECTED 30 mL 4  . budesonide-formoterol (SYMBICORT) 160-4.5 MCG/ACT inhaler Inhale 2 puffs into the lungs 2 (two) times daily. 1 Inhaler 6  . Cholecalciferol (VITAMIN D3) 2000 UNITS TABS Take 1 tablet by mouth daily.    . Cyanocobalamin (VITAMIN B-12) 5000 MCG SUBL Place 1 tablet under the tongue daily.    Marland Kitchen doxazosin  (CARDURA) 8 MG tablet Take 4-8 mg by mouth daily.     . fluticasone (FLONASE) 50 MCG/ACT nasal spray 1 spray in each nostril qd 16 g 5  . gabapentin (NEURONTIN) 300 MG capsule Take 600 mg by mouth 3 (three) times daily.     Marland Kitchen guaiFENesin (MUCINEX) 600 MG 12 hr tablet Take 600 mg by mouth 2 (two) times daily.     Marland Kitchen latanoprost (XALATAN) 0.005 % ophthalmic solution Place 1 drop into the left eye at bedtime.     . Probiotic Product (ALIGN) 4 MG CAPS Take 1 tablet by mouth daily.     No current facility-administered medications on file prior to visit.    Past Medical History  Diagnosis Date  . IBS (irritable bowel syndrome)   . Enlarged prostate   . Sinus congestion   . Sciatic pain   . COPD (chronic obstructive pulmonary disease) ([place])   . Glucagonoma   . Shortness of breath   . HOH (hard of hearing)   . Hernia, incisional     at present  . Bronchitis, chronic ([place])   . GERD (gastroesophageal reflux disease)     Past Surgical History  Procedure Laterality Date  . Hemorrhoid surgery    . Laparotomy N/A 09/04/2012    Procedure: EXPLORATORY LAPAROTOMY;  Surgeon: Donato Heinz, MD;  Location: [facility];  Service: General;  Laterality: N/A;  .  Bowel resection N/A 09/04/2012    Procedure: SMALL BOWEL RESECTION;  Surgeon: Donato Heinz, MD;  Location: [facility];  Service: General;  Laterality: N/A;  Anastimosis  . Hernia repair Right 70's  . Inguinal hernia repair Left 09/14/2013    Procedure: LEFT INGUINAL HERNIORRHAPHY;  Surgeon: Jamesetta So, MD;  Location: [facility];  Service: General;  Laterality: Left;  . Insertion of mesh Left 09/14/2013    Procedure: INSERTION OF MESH;  Surgeon: Jamesetta So, MD;  Location: [facility];  Service: General;  Laterality: Left;  . Tonsillectomy    . Knee arthroscopy Left 07/21/2014    Procedure: LEFT KNEE ARTHROSCOPY WITH MEDIAL MENISCAL DEBRIDEMENT ;  Surgeon: Gearlean Alf, MD;  Location: [facility];  Service: Orthopedics;  Laterality: Left;    Family  History  Problem Relation Age of Onset  . Asthma      cousin  . Heart disease Mother   . Colon cancer Father     Social History   Social History  . Marital Status: Married    Spouse Name: [name]  . Number of Children: 3  . Years of Education: 12+   Occupational History  . Retired     Immunologist   Social History Main Topics  . Smoking status: Former Smoker -- 1.00 packs/day for 30 years    Types: Cigarettes    Quit date: 05/28/2006  . Smokeless tobacco: Never Used  . Alcohol Use: 0.0 oz/week    0 Standard drinks or equivalent per week     Comment: Rare beer or wine.  . Drug Use: No  . Sexual Activity: Yes    Birth Control/ Protection: None   Other Topics Concern  . None   Social History Narrative   Patient lives at home with [name].    Patient has 3 children.    Patient has his BS   Patient is retired.       [facility]:   He is still married. 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 He is from [place].             Objective:   Physical Exam Blood pressure 122/62, pulse 81, height 5' 10.5" (1.791 m), weight 181 lb (82.101 kg), SpO2 97 %. General:  Awake. Alert. No acute distress. Caucasian male.  Integument:  Warm & dry. No rash on exposed skin. No bruising. HEENT:  Moist mucus membranes. No oral ulcers. No scleral injection or icterus. Minimal nasal turbinate swelling bilaterally Cardiovascular:  Regular rate & rhythm. No edema.   Pulmonary:  Good aeration & clear to auscultation bilaterally. Symmetric chest wall expansion. No accessory muscle use on room air. Abdomen: Soft. Normal bowel sounds. Nondistended. Grossly nontender. Musculoskeletal:  Normal bulk and tone. Hand grip strength 5/5 bilaterally. No joint deformity or effusion appreciated.  PFT 06/22/13: FVC 3.74 L (89%) FEV1 2.39 L (77%) FEV1/FVC 0.64 FEF 25-75 1.17 L (46%)  IMAGING CT CHEST W/O 05/04/03  (personally reviewed by me): Mild cylindrical bronchiectasis with linear opacity particularly in the right lower lobe suggestive of scar formation. Some hilar node calcification especially on the left. 2 separate subcentimeter nodules within the inferior segment of the lingula one of which is at least partially calcified. No pleural effusion or thickening appreciated. No pericardial effusion.    Assessment & Plan:  81 year old male with a long history of obstructive chronic bronchitis. Patient does continue to have  cough productive of mucus. I suspect there is likely some contribution from his ongoing issues with gastroesophageal reflux. The patient is intermittently using Tums & Rolaids for relief. I reviewed his prior pulmonary function testing from 2015 which only showed mild airways obstruction which could be consistent with his advanced age. The degree of bronchiectasis on his prior CT imaging from 2004 was very mild without any evidence of emphysema on my review. However, I did note to subcentimeter nodules in his inferior segment of the lingula with at least some partial calcification one of the nodules suggesting prior granulomatous disease. However, given his prior history of tobacco use follow-up with CT imaging is reasonable I feel. An infectious etiology to the patient's ongoing cough is unlikely but must also be considered. I instructed the patient contact my office if he had any new breathing problems before his next appointment.   1. Obstructive chronic bronchitis/possible mild COPD: Screening for alpha-1 antitrypsin deficiency. Repeating full pulmonary function testing as well as 6 minute walk test at next appointment. Also checking sputum culture for AFB, fungus, and bacteria. Checking quantitative immunoglobulin panel for possible immunoglobulin deficiency. 2. Lingula nodules: Plan to repeat CT scan of the chest without contrast. 3. GERD: Starting Zantac 150 mg by mouth daily at bedtime.  Patient counseled to avoid eating within 2 hours of bedtime. 4. Health maintenance: Patient reports he did receive the influenza vaccine. 5. Follow-up: Patient to return to clinic in 3 months or sooner if needed.

## 2015-04-12 LAB — IGG, IGA, IGM
IGA: 195 mg/dL (ref 68–379)
IgG (Immunoglobin G), Serum: 822 mg/dL (ref 650–1600)
IgM, Serum: 31 mg/dL — ABNORMAL LOW (ref 41–251)

## 2015-04-15 LAB — ALPHA-1 ANTITRYPSIN PHENOTYPE: A-1 Antitrypsin: 153 mg/dL (ref 83–199)

## 2015-04-19 ENCOUNTER — Ambulatory Visit (INDEPENDENT_AMBULATORY_CARE_PROVIDER_SITE_OTHER)
Admission: RE | Admit: 2015-04-19 | Discharge: 2015-04-19 | Disposition: A | Discharge status: Home / Self Care | Payer: Medicare Other | Source: Ambulatory Visit | Attending: Pulmonary Disease | Admitting: Pulmonary Disease

## 2015-04-19 ENCOUNTER — Other Ambulatory Visit: Payer: Medicare Other

## 2015-04-19 DIAGNOSIS — J40 Bronchitis, not specified as acute or chronic: Secondary | ICD-10-CM | POA: Diagnosis not present

## 2015-04-19 DIAGNOSIS — R918 Other nonspecific abnormal finding of lung field: Secondary | ICD-10-CM | POA: Diagnosis not present

## 2015-04-19 DIAGNOSIS — J449 Chronic obstructive pulmonary disease, unspecified: Secondary | ICD-10-CM

## 2015-04-22 LAB — RESPIRATORY CULTURE OR RESPIRATORY AND SPUTUM CULTURE
CULTURE: NORMAL
Organism ID, Bacteria: NORMAL

## 2015-04-27 ENCOUNTER — Telehealth: Payer: Self-pay | Admitting: Pulmonary Disease

## 2015-04-27 NOTE — Telephone Encounter (Signed)
He can try stopping the Zantac to see if the diarrhea resolves. Rarely it can cause diarrhea. As far as the sinus congestion & drainage it sounds like it could be a sinus infection and he probably needs to be seen by someone either in our office or by his PCP.

## 2015-04-27 NOTE — Telephone Encounter (Signed)
Called spoke with pt. He started the zantac after visit with Dr. Ashok Cordia on 11/14. Couple days after that he had noticed having loose bowels. For the past few days they are now more frequent happening every 30 min. Requesting recs. Also pt c/o nasal cong, PND, prod cough (grey-yellow phlem), Please advise Dr. Ashok Cordia thanks

## 2015-04-27 NOTE — Telephone Encounter (Signed)
Called spoke with pt. Aware of recs below. He scheduled appt to see MW tomorrow at 11:45. Nothing further needed

## 2015-04-28 ENCOUNTER — Encounter: Payer: Self-pay | Admitting: Internal Medicine

## 2015-04-28 ENCOUNTER — Ambulatory Visit (INDEPENDENT_AMBULATORY_CARE_PROVIDER_SITE_OTHER): Payer: Medicare Other | Admitting: Internal Medicine

## 2015-04-28 VITALS — BP 116/68 | HR 77 | Ht 70.0 in | Wt 176.8 lb

## 2015-04-28 DIAGNOSIS — J449 Chronic obstructive pulmonary disease, unspecified: Secondary | ICD-10-CM | POA: Diagnosis not present

## 2015-04-28 DIAGNOSIS — R058 Other specified cough: Secondary | ICD-10-CM | POA: Insufficient documentation

## 2015-04-28 DIAGNOSIS — R05 Cough: Secondary | ICD-10-CM | POA: Insufficient documentation

## 2015-04-28 MED ORDER — MOMETASONE FURO-FORMOTEROL FUM 100-5 MCG/ACT IN AERO: INHALATION_SPRAY | RESPIRATORY_TRACT | Status: DC

## 2015-04-28 NOTE — Assessment & Plan Note (Signed)
Classic Upper airway cough syndrome, so named because it's frequently impossible to sort out how much is  CR/sinusitis with freq throat clearing (which can be related to primary GERD)   vs  causing  secondary (" extra esophageal")  GERD from wide swings in gastric pressure that occur with throat clearing, often  promoting self use of mint and menthol lozenges that reduce the lower esophageal sphincter tone and exacerbate the problem further in a cyclical fashion.   These are the same pts (now being labeled as having "irritable larynx syndrome" by some cough centers) who not infrequently have a history of having failed to tolerate ace inhibitors,  dry powder inhalers or biphosphonates or report having atypical reflux symptoms that don't respond to standard doses of PPI , and are easily confused as having aecopd or asthma flares by even experienced allergists/ pulmonologists.   For now try lower dose iCS (dulera 100 2bid) and gerd diet

## 2015-04-28 NOTE — Assessment & Plan Note (Signed)
06/22/13: FVC 3.74 L (89%) FEV1 2.39 L (77%)  FEV1/FVC 0.64    - The proper method of use, as well as anticipated side effects, of a metered-dose inhaler are discussed and demonstrated to the patient. Improved effectiveness after extensive coaching during this visit to a level of approximately 75 % from a baseline of 25%  It may be the high dose of ICS is backfiring at this point since most of his symptoms are upper airway > rec trial of dulera 100 2bid  Also concerned about gerd but can't tol zantac > stressed diet for now  I had an extended discussion with the patient reviewing all relevant studies completed to date and  lasting 15 to 20 minutes of a 25 minute visit    Each maintenance medication was reviewed in detail including most importantly the difference between maintenance and prns and under what circumstances the prns are to be triggered using an action plan format that is not reflected in the computer generated alphabetically organized AVS.    Please see instructions for details which were reviewed in writing and the patient given a copy highlighting the part that I personally wrote and discussed at today's ov.

## 2015-04-28 NOTE — Progress Notes (Signed)
Subjective:    Patient ID: Charles Golden, male    DOB: 1926-05-27    MRN: [id number]     HPI [place] 04/11/15 Chronic Bronchitis/Mild Bronchiectasis:  Last seen in July and treated for acute exacerbation with a course of Levaquin. He reports he has intermittent coughing that is quite frequent at times. It is productive of a "gray" mucus. He does have wheezing at times. He does have some exertional dyspnea. He reports he gets bronchitis 1-2 times a year that requires treatment with antibiotics. He reports he is using his Symbicort inhaler at least once daily. Lingula Nodules: Nodules seen on CT imaging from 2004 with at least some calcification & subcentimeter in size. GERD:  He reports his reflux has worsened over the last month. He is using Tums & Rolaids. He does have dyspepsia at times. Review of Systems He reports increasing fatigue lately. He has also noted some mild right upper quadrant pain. He denies any chills or sweats. He reports occasional subjective fever but his temperature is no higher than 99.67F. rec 1. I'm checking you for the genetic form of COPD and also checking your immune system. I'll call if these tests are abnormal. 2. I'm repeating a CT of your chest to follow-up on the spots in your left lung. 3. We will repeat a breathing test & walking test at your next appointment. 4. I'm starting you on Zantac at night to take before bed. Try to avoid eating within 2 hours of bedtime. 5. We are checking a culture of your sputum to make sure you don't have any infection causing your cough.    04/28/2015  f/u ov/Hallee Mckenny re: persistent cough /GOLD I copd   Chief Complaint  Patient presents with  . Acute Visit    JN pt here for congestion and cough: pt states over thanksgivning he had a bad cold and some diarreah after he started takingthe zantac Dr. Ashok Cordia perscribed. pt here is to see if he can get off the Zantac.   cough is worse day than noct/ more dry than wet on symb 160 2bid  assoc with sense of pnds  Baseline doe = MMRC1 = can walk nl pace, flat grade, can't hurry or go uphills or steps s sob / mainly limited by fatigue    No obvious day to day or daytime variability or assoc cp or chest tightness, subjective wheeze or overt sinus or hb symptoms. No unusual exp hx or h/o childhood pna/ asthma or knowledge of premature birth.  Sleeping ok without nocturnal  or early am exacerbation  of respiratory  c/o's or need for noct saba. Also denies any obvious fluctuation of symptoms with weather or environmental changes or other aggravating or alleviating factors except as outlined above   Current Medications, Allergies, Complete Past Medical History, Past Surgical History, Family History, and Social History were reviewed in [employer] record.  ROS  The following are not active complaints unless bolded sore throat, dysphagia, dental problems, itching, sneezing,  nasal congestion or excess/ purulent secretions, ear ache,   fever, chills, sweats, unintended wt loss, classically pleuritic or exertional cp, hemoptysis,  orthopnea pnd or leg swelling, presyncope, palpitations, abdominal pain, anorexia, nausea, vomiting, diarrhea  or change in bowel or bladder habits, change in stools or urine, dysuria,hematuria,  rash, arthralgias, visual complaints, headache, numbness, weakness or ataxia or problems with walking or coordination,  change in mood/affect or memory.  Objective:   Physical Exam   amb wm / rambling historian/ upper airway pattern cough   Wt Readings from Last 3 Encounters:  04/28/15 176 lb 12.8 oz (80.196 kg)  04/11/15 181 lb (82.101 kg)  02/15/15 183 lb 3.2 oz (83.099 kg)    Vital signs reviewed    General:  Awake. Alert. No acute distress. Caucasian male.  Integument:  Warm & dry. No rash on exposed skin. No bruising. HEENT:  Moist mucus membranes. No oral ulcers. No scleral injection or icterus. Minimal nasal turbinate  swelling bilaterally Cardiovascular:  Regular rate & rhythm. No edema.   Pulmonary:  Good aeration & clear to auscultation bilaterally. Symmetric chest wall expansion.   Abdomen: Soft. Normal bowel sounds. Nondistended. Grossly nontender. Musculoskeletal:  Normal bulk and tone. Hand grip strength 5/5 bilaterally. No joint deformity or effusion appreciated.  PFT 06/22/13: FVC 3.74 L (89%) FEV1 2.39 L (77%) FEV1/FVC 0.64 FEF 25-75 1.17 L (46%)   I personally reviewed images and agree with radiology impression as follows:  CT Chest   04/19/15 Hyperinflation and mild background COPD/ emphysema.  Stable sub cm nodules and subpleural scarring in the left upper lobe peripherally.  Evidence of remote granulomatous disease with calcified left hilar lymph nodes  Thoracic aortic and coronary atherosclerosis  Chronic right base scarring  No superimposed acute chest process.      Assessment & Plan:

## 2015-04-28 NOTE — Patient Instructions (Signed)
Stop the zantac and symbicort   dulera 100 Take 2 puffs first thing in am and then another 2 puffs about 12 hours later - call us if you like it better than the symbicort and we'll call it in for you or the lower strength of symbicort  Work on inhaler technique:  relax and gently blow all the way out then take a nice smooth deep breath back in, triggering the inhaler at same time you start breathing in.  Hold for up to 5 seconds if you can. Blow out thru nose. Rinse and gargle with water when done.

## 2015-05-03 ENCOUNTER — Encounter: Payer: Self-pay | Admitting: Family Medicine

## 2015-05-03 ENCOUNTER — Ambulatory Visit (INDEPENDENT_AMBULATORY_CARE_PROVIDER_SITE_OTHER): Payer: Medicare Other | Admitting: Family Medicine

## 2015-05-03 VITALS — BP 106/67 | HR 88 | Temp 97.8°F | Ht 70.0 in | Wt 173.0 lb

## 2015-05-03 DIAGNOSIS — J449 Chronic obstructive pulmonary disease, unspecified: Secondary | ICD-10-CM

## 2015-05-03 DIAGNOSIS — J479 Bronchiectasis, uncomplicated: Secondary | ICD-10-CM | POA: Diagnosis not present

## 2015-05-03 DIAGNOSIS — J329 Chronic sinusitis, unspecified: Secondary | ICD-10-CM | POA: Diagnosis not present

## 2015-05-03 DIAGNOSIS — E559 Vitamin D deficiency, unspecified: Secondary | ICD-10-CM

## 2015-05-03 DIAGNOSIS — R630 Anorexia: Secondary | ICD-10-CM | POA: Diagnosis not present

## 2015-05-03 DIAGNOSIS — E7800 Pure hypercholesterolemia, unspecified: Secondary | ICD-10-CM | POA: Diagnosis not present

## 2015-05-03 DIAGNOSIS — R5383 Other fatigue: Secondary | ICD-10-CM

## 2015-05-03 DIAGNOSIS — R058 Other specified cough: Secondary | ICD-10-CM

## 2015-05-03 DIAGNOSIS — N400 Enlarged prostate without lower urinary tract symptoms: Secondary | ICD-10-CM

## 2015-05-03 DIAGNOSIS — I700 Atherosclerosis of aorta: Secondary | ICD-10-CM | POA: Diagnosis not present

## 2015-05-03 DIAGNOSIS — R197 Diarrhea, unspecified: Secondary | ICD-10-CM | POA: Diagnosis not present

## 2015-05-03 DIAGNOSIS — R05 Cough: Secondary | ICD-10-CM | POA: Diagnosis not present

## 2015-05-03 DIAGNOSIS — J4489 Other specified chronic obstructive pulmonary disease: Secondary | ICD-10-CM

## 2015-05-03 MED ORDER — DOXAZOSIN MESYLATE 8 MG PO TABS: 4.0000 mg | ORAL_TABLET | Freq: Every day | ORAL | Status: DC

## 2015-05-03 MED ORDER — MOMETASONE FURO-FORMOTEROL FUM 100-5 MCG/ACT IN AERO: INHALATION_SPRAY | RESPIRATORY_TRACT | Status: DC

## 2015-05-03 MED ORDER — AZELASTINE HCL 0.1 % NA SOLN: NASAL | Status: DC

## 2015-05-03 MED ORDER — FLUTICASONE PROPIONATE 50 MCG/ACT NA SUSP: NASAL | Status: DC

## 2015-05-03 NOTE — Progress Notes (Signed)
Subjective:    Patient ID: Charles Golden, male    DOB: 1925-12-07, 81 y.o.   MRN: [id number]  HPI Pt here for follow up and management of chronic medical problems which includes COPD and elevated cholesterol. He is taking medications regularly. He is accompanied today by his wife. The patient started with some type of respiratory symptoms around Thanksgiving and since then his become more fatigued, more shortness of breath, and has had some heartburn and loose stools. He brings in a copy of a CT of the chest that showed hyperinflation COPD and emphysema with stable small lung nodules and scarring in the left upper lobe. He has thoracic aortic and coronary atherosclerosis. The patient denies any chest pain or shortness of breath he just has weakness. He is having as many as 6 bowel movements that are watery daily and he the past few days has also had stool incontinence at nighttime. He has a diminished appetite and as indicated increased heartburn and nausea. He has further studies planned by his pulmonologist with pulmonary function testing in 2017. He is passing his water without problems. He's not drinking caffeine but does eat some cheese and dairy products.    Patient Active Problem List   Diagnosis Date Noted  . Upper airway cough syndrome 04/28/2015  . GERD (gastroesophageal reflux disease) 04/11/2015  . Tinea corporis 02/15/2015  . Acute medial meniscal tear 07/20/2014  . COPD GOLD I  12/08/2013  . Small bowel volvulus ([place]) 08/05/2013  . Chronic headache 08/05/2013  . Left inguinal hernia 08/05/2013  . Incisional hernia, without obstruction or gangrene 08/05/2013  . Bronchiectasis without acute exacerbation ([place]) 06/22/2013  . Headache(784.0) 05/01/2013  . Obstructive chronic bronchitis without exacerbation ([place]) 04/30/2013  . Left hip pain 04/29/2013  . BPH (benign prostatic hyperplasia) 01/05/2013  . IBS (irritable bowel syndrome) 09/03/2012  . Chronic rhinosinusitis 09/03/2012    . Lung nodules 05/04/2003   Outpatient Encounter Prescriptions as of 05/03/2015  Medication Sig  . aspirin 81 MG tablet Take 81 mg by mouth daily.    Marland Kitchen azelastine (ASTELIN) 0.1 % nasal spray PLACE 1 SPRAY INTO THE NOSE 1 DAY OR 1 DOSE. USE IN EACH NOSTRIL AS DIRECTED  . Cholecalciferol (VITAMIN D3) 2000 UNITS TABS Take 1 tablet by mouth daily.  . Cyanocobalamin (VITAMIN B-12) 5000 MCG SUBL Place 1 tablet under the tongue daily.  Marland Kitchen doxazosin (CARDURA) 8 MG tablet Take 4-8 mg by mouth daily.   . fluticasone (FLONASE) 50 MCG/ACT nasal spray 1 spray in each nostril qd  . guaiFENesin (MUCINEX) 600 MG 12 hr tablet Take 600 mg by mouth 2 (two) times daily.   Marland Kitchen latanoprost (XALATAN) 0.005 % ophthalmic solution Place 1 drop into the left eye at bedtime.   . mometasone-formoterol (DULERA) 100-5 MCG/ACT AERO Take 2 puffs first thing in am and then another 2 puffs about 12 hours later.  . Probiotic Product (ALIGN) 4 MG CAPS Take 1 tablet by mouth daily.  . [DISCONTINUED] azelastine (ASTELIN) 0.1 % nasal spray PLACE 1 SPRAY INTO THE NOSE 1 DAY OR 1 DOSE. USE IN EACH NOSTRIL AS DIRECTED   No facility-administered encounter medications on file as of 05/03/2015.      Review of Systems  Constitutional: Positive for fatigue.  HENT: Congestion: around Thanksgiving    Eyes: Negative.   Respiratory: Positive for shortness of breath.   Cardiovascular: Negative.   Gastrointestinal: Positive for diarrhea.       Heart burn  Endocrine: Negative.  Genitourinary: Negative.   Musculoskeletal: Negative.   Skin: Negative.   Allergic/Immunologic: Negative.   Neurological: Negative.   Hematological: Negative.   Psychiatric/Behavioral: Negative.        Objective:   Physical Exam  Constitutional: He is oriented to person, place, and time. He appears well-developed and well-nourished. He appears distressed.  HENT:  Head: Normocephalic and atraumatic.  Right Ear: External ear normal.  Left Ear: External ear  normal.  Mouth/Throat: Oropharynx is clear and moist. No oropharyngeal exudate.  Nasal congestion  Eyes: Conjunctivae and EOM are normal. Pupils are equal, round, and reactive to light. Right eye exhibits no discharge. Left eye exhibits no discharge. No scleral icterus.  Neck: Normal range of motion. Neck supple. No thyromegaly present.  Cardiovascular: Normal rate, regular rhythm and normal heart sounds.   No murmur heard. The heart is regular at about 84/m  Pulmonary/Chest: Effort normal and breath sounds normal. No respiratory distress. He has no wheezes. He has no rales. He exhibits no tenderness.  His chest asked he sounded pretty good even with coughing there was minimal congestion.  Abdominal: Soft. He exhibits no mass. There is no tenderness. There is no rebound and no guarding.  Increased bowel sounds with no specific organ enlargement or tenderness.  Musculoskeletal: Normal range of motion. He exhibits no edema or tenderness.  Lymphadenopathy:    He has no cervical adenopathy.  Neurological: He is alert and oriented to person, place, and time.  Skin: Skin is warm and dry. No rash noted.  Psychiatric: He has a normal mood and affect. His behavior is normal. Judgment and thought content normal.  Nursing note and vitals reviewed.  BP 106/67 mmHg  Pulse 88  Temp(Src) 97.8 F (36.6 C) (Oral)  Ht 5' 10"  (1.778 m)  Wt 173 lb (78.472 kg)  BMI 24.82 kg/m2        Assessment & Plan:  1. Vitamin D deficiency -Continue current treatment pending results of lab work - CBC with Differential/Platelet; Future - VITAMIN D 25 Hydroxy (Vit-D Deficiency, Fractures); Future - CBC with Differential/Platelet - VITAMIN D 25 Hydroxy (Vit-D Deficiency, Fractures)  2. BPH (benign prostatic hyperplasia) -Continue follow-up with urology - CBC with Differential/Platelet; Future - CBC with Differential/Platelet  3. Chronic obstructive pulmonary disease, unspecified COPD, unspecified chronic  bronchitis type -Continue follow-up with pulmonology - CBC with Differential/Platelet; Future - CBC with Differential/Platelet  4. Elevated LDL cholesterol level -Continue to avoid foods that are high in cholesterol and fats - CBC with Differential/Platelet; Future - Lipid panel; Future - Ambulatory referral to Cardiology - CBC with Differential/Platelet - Lipid panel  5. Diarrhea, unspecified type -Avoid caffeine and milk cheese ice cream and dairy products and drink plenty of fluids water etc. Including ginger ale or 7-Up -He is to take Imodium enough daily to help keep the bowel movements formed. I told to start out with 2 or 3 daily. - BMP8+EGFR; Future - CBC with Differential/Platelet; Future - Hepatic function panel; Future - Ambulatory referral to Gastroenterology - Ova and parasite examination - Clostridium difficile EIA - Stool culture - Cdiff NAA+O+P+Stool Culture - CBC with Differential/Platelet - Hepatic function panel - BMP8+EGFR  6. Other fatigue -I feel a lot of the patient's fatigue issues and feeling bad issues are secondary to his 6 bowel movements a day and lack of control of his bowel movements. - BMP8+EGFR; Future - CBC with Differential/Platelet; Future - Hepatic function panel; Future - Thyroid Panel With TSH; Future - Ambulatory referral to Gastroenterology -  Ova and parasite examination - Clostridium difficile EIA - Stool culture - Cdiff NAA+O+P+Stool Culture - CBC with Differential/Platelet - Thyroid Panel With TSH - Hepatic function panel - BMP8+EGFR  7. Decreased appetite -Start with dexilant 1 daily, samples were given for 2 weeks - BMP8+EGFR; Future - CBC with Differential/Platelet; Future - Hepatic function panel; Future - Thyroid Panel With TSH; Future - Ambulatory referral to Gastroenterology - Ova and parasite examination - Clostridium difficile EIA - Stool culture - Cdiff NAA+O+P+Stool Culture - CBC with  Differential/Platelet - Thyroid Panel With TSH - Hepatic function panel - BMP8+EGFR  8. Upper airway cough syndrome -Continue with follow-up by pulmonology  9. Obstructive chronic bronchitis without exacerbation ([place]) -Continue with breathing treatments and follow up with pulmonology  10. COPD GOLD I  -Continue with above  11. Chronic sinusitis, unspecified location -Continue with Mucinex  12. Bronchiectasis without complication ([place]) -Continue follow-up with pulmonology  13. Thoracic aortic atherosclerosis [facility]) - Ambulatory referral to Cardiology  Patient Instructions                       Medicare Annual Wellness Visit  [facility] and the medical providers at [facility] strive to bring you the best medical care.  In doing so we not only want to address your current medical conditions and concerns but also to detect new conditions early and prevent illness, disease and health-related problems.    Medicare offers a yearly Wellness Visit which allows our clinical staff to assess your need for preventative services including immunizations, lifestyle education, counseling to decrease risk of preventable diseases and screening for fall risk and other medical concerns.    This visit is provided free of charge (no copay) for all Medicare recipients. The clinical pharmacists at [facility] have begun to conduct these Wellness Visits which will also include a thorough review of all your medications.    As you primary medical provider recommend that you make an appointment for your Annual Wellness Visit if you have not done so already this year.  You may set up this appointment before you leave today or you may call back [phone]) and schedule an appointment.  Please make sure when you call that you mention that you are scheduling your Annual Wellness Visit with the clinical pharmacist so that the appointment may be made for the proper length  of time.  ,   Continue current medications. Continue good therapeutic lifestyle changes which include good diet and exercise. Fall precautions discussed with patient. If an FOBT was given today- please return it to our front desk. If you are over 79 years old - you may need Prevnar 85 or the adult Pneumonia vaccine.  **Flu shots are available--- please call and schedule a FLU-CLINIC appointment**  After your visit with [place] today you will receive a survey in the mail or online from [employer] regarding your care with [place]. Please take a moment to fill this out. Your feedback is very important to [place] as you can help [place] better understand your patient needs as well as improve your experience and satisfaction. WE CARE ABOUT YOU!!!   We will arrange for the patient have a visit with Dr. Earlean Shawl We would ask the patient to take Imodium 2 or 3 times daily on a regular basis We will also give him Dexilent to take once a day and these are samples to help the heartburn We will ask him to return a  stool sample for stool pathogens including C. difficile culture and sensitivity and ova and parasites We will ask him to avoid all products and caffeine as well as milk cheese ice cream and dairy products We will ask him to drink plenty of fluids and try to stay well hydrated We will schedule a one time visit with the cardiologist.   Arrie Senate MD

## 2015-05-03 NOTE — Addendum Note (Signed)
Addended by: Zannie Cove on: 05/03/2015 04:10 PM   Modules accepted: Orders

## 2015-05-03 NOTE — Addendum Note (Signed)
Addended by: Selmer Dominion on: 05/03/2015 04:21 PM   Modules accepted: Orders

## 2015-05-03 NOTE — Patient Instructions (Addendum)
Medicare Annual Wellness Visit  [facility] and the medical providers at [facility] strive to bring you the best medical care.  In doing so we not only want to address your current medical conditions and concerns but also to detect new conditions early and prevent illness, disease and health-related problems.    Medicare offers a yearly Wellness Visit which allows our clinical staff to assess your need for preventative services including immunizations, lifestyle education, counseling to decrease risk of preventable diseases and screening for fall risk and other medical concerns.    This visit is provided free of charge (no copay) for all Medicare recipients. The clinical pharmacists at [facility] have begun to conduct these Wellness Visits which will also include a thorough review of all your medications.    As you primary medical provider recommend that you make an appointment for your Annual Wellness Visit if you have not done so already this year.  You may set up this appointment before you leave today or you may call back [phone] and schedule an appointment.  Please make sure when you call that you mention that you are scheduling your Annual Wellness Visit with the clinical pharmacist so that the appointment may be made for the proper length of time.  ,   Continue current medications. Continue good therapeutic lifestyle changes which include good diet and exercise. Fall precautions discussed with patient. If an FOBT was given today- please return it to our front desk. If you are over 41 years old - you may need Prevnar 13 or the adult Pneumonia vaccine.  **Flu shots are available--- please call and schedule a FLU-CLINIC appointment**  After your visit with [place] today you will receive a survey in the mail or online from [employer] regarding your care with [place]. Please take a moment to fill this out. Your feedback is very  important to [place] as you can help [place] better understand your patient needs as well as improve your experience and satisfaction. WE CARE ABOUT YOU!!!   We will arrange for the patient have a visit with Dr. Earlean Shawl We would ask the patient to take Imodium 2 or 3 times daily on a regular basis We will also give him Dexilent to take once a day and these are samples to help the heartburn We will ask him to return a stool sample for stool pathogens including C. difficile culture and sensitivity and ova and parasites We will ask him to avoid all products and caffeine as well as milk cheese ice cream and dairy products We will ask him to drink plenty of fluids and try to stay well hydrated We will schedule a one time visit with the cardiologist.

## 2015-05-04 ENCOUNTER — Other Ambulatory Visit: Payer: Medicare Other

## 2015-05-04 ENCOUNTER — Telehealth: Payer: Self-pay | Admitting: Family Medicine

## 2015-05-04 DIAGNOSIS — R5383 Other fatigue: Secondary | ICD-10-CM | POA: Diagnosis not present

## 2015-05-04 DIAGNOSIS — Z1212 Encounter for screening for malignant neoplasm of rectum: Secondary | ICD-10-CM

## 2015-05-04 DIAGNOSIS — R197 Diarrhea, unspecified: Secondary | ICD-10-CM | POA: Diagnosis not present

## 2015-05-04 DIAGNOSIS — R630 Anorexia: Secondary | ICD-10-CM | POA: Diagnosis not present

## 2015-05-04 LAB — LIPID PANEL
CHOLESTEROL TOTAL: 147 mg/dL (ref 100–199)
Chol/HDL Ratio: 3.7 ratio units (ref 0.0–5.0)
HDL: 40 mg/dL (ref >39)
LDL Calculated: 88 mg/dL (ref 0–99)
TRIGLYCERIDES: 95 mg/dL (ref 0–149)
VLDL Cholesterol Cal: 19 mg/dL (ref 5–40)

## 2015-05-04 LAB — CBC WITH DIFFERENTIAL/PLATELET
Basophils Absolute: 0 10*3/uL (ref 0.0–0.2)
Basos: 0 %
EOS (ABSOLUTE): 0.1 10*3/uL (ref 0.0–0.4)
Eos: 1 %
Hematocrit: 43.1 % (ref 37.5–51.0)
Hemoglobin: 14.1 g/dL (ref 12.6–17.7)
Immature Grans (Abs): 0 10*3/uL (ref 0.0–0.1)
Immature Granulocytes: 0 %
LYMPHS ABS: 1.6 10*3/uL (ref 0.7–3.1)
Lymphs: 25 %
MCH: 29.6 pg (ref 26.6–33.0)
MCHC: 32.7 g/dL (ref 31.5–35.7)
MCV: 91 fL (ref 79–97)
MONOS ABS: 0.7 10*3/uL (ref 0.1–0.9)
Monocytes: 11 %
NEUTROS PCT: 63 %
Neutrophils Absolute: 4 10*3/uL (ref 1.4–7.0)
PLATELETS: 299 10*3/uL (ref 150–379)
RBC: 4.76 x10E6/uL (ref 4.14–5.80)
RDW: 13.2 % (ref 12.3–15.4)
WBC: 6.3 10*3/uL (ref 3.4–10.8)

## 2015-05-04 LAB — BMP8+EGFR
BUN/Creatinine Ratio: 15 (ref 10–22)
BUN: 22 mg/dL (ref 8–27)
CALCIUM: 9.4 mg/dL (ref 8.6–10.2)
CO2: 22 mmol/L (ref 18–29)
CREATININE: 1.48 mg/dL — AB (ref 0.76–1.27)
Chloride: 104 mmol/L (ref 97–106)
GFR, EST AFRICAN AMERICAN: 48 mL/min/{1.73_m2} — AB (ref >59)
GFR, EST NON AFRICAN AMERICAN: 42 mL/min/{1.73_m2} — AB (ref >59)
Glucose: 101 mg/dL — ABNORMAL HIGH (ref 65–99)
POTASSIUM: 4.4 mmol/L (ref 3.5–5.2)
Sodium: 143 mmol/L (ref 136–144)

## 2015-05-04 LAB — HEPATIC FUNCTION PANEL
ALBUMIN: 3.9 g/dL (ref 3.5–4.7)
ALT: 9 IU/L (ref 0–44)
AST: 13 IU/L (ref 0–40)
Alkaline Phosphatase: 83 IU/L (ref 39–117)
BILIRUBIN TOTAL: 0.3 mg/dL (ref 0.0–1.2)
Bilirubin, Direct: 0.09 mg/dL (ref 0.00–0.40)
TOTAL PROTEIN: 5.8 g/dL — AB (ref 6.0–8.5)

## 2015-05-04 LAB — THYROID PANEL WITH TSH
Free Thyroxine Index: 2.3 (ref 1.2–4.9)
T3 UPTAKE RATIO: 26 % (ref 24–39)
T4 TOTAL: 8.9 ug/dL (ref 4.5–12.0)
TSH: 1.73 u[IU]/mL (ref 0.450–4.500)

## 2015-05-04 LAB — VITAMIN D 25 HYDROXY (VIT D DEFICIENCY, FRACTURES): Vit D, 25-Hydroxy: 46.4 ng/mL (ref 30.0–100.0)

## 2015-05-04 NOTE — Telephone Encounter (Signed)
Reviewed results with pt .

## 2015-05-05 LAB — FECAL OCCULT BLOOD, IMMUNOCHEMICAL: Fecal Occult Bld: NEGATIVE

## 2015-05-09 ENCOUNTER — Telehealth: Payer: Self-pay | Admitting: Pulmonary Disease

## 2015-05-09 DIAGNOSIS — R197 Diarrhea, unspecified: Secondary | ICD-10-CM | POA: Diagnosis not present

## 2015-05-09 LAB — CDIFF NAA+O+P+STOOL CULTURE
CDIFFPCR: NEGATIVE
E coli, Shiga toxin Assay: NEGATIVE

## 2015-05-09 NOTE — Telephone Encounter (Signed)
Called & informed patient's wife regarding CT results. No evidence of progression of his lung nodules or any change concerning for malignancy. Instructed her to have him call back tomorrow if he wanted more detailed explanation.

## 2015-05-17 ENCOUNTER — Ambulatory Visit (INDEPENDENT_AMBULATORY_CARE_PROVIDER_SITE_OTHER): Payer: Medicare Other | Admitting: Pediatrics

## 2015-05-17 VITALS — BP 119/70 | HR 75 | Temp 97.9°F | Ht 70.0 in | Wt 175.2 lb

## 2015-05-17 DIAGNOSIS — B354 Tinea corporis: Secondary | ICD-10-CM | POA: Diagnosis not present

## 2015-05-17 LAB — FUNGUS CULTURE W SMEAR: SMEAR RESULT: NONE SEEN

## 2015-05-17 MED ORDER — TERBINAFINE HCL 250 MG PO TABS: 250.0000 mg | ORAL_TABLET | Freq: Every day | ORAL | Status: DC

## 2015-05-17 NOTE — Patient Instructions (Signed)
If not improving in 2 weeks, come back to see [place]. May need to do biopsy.

## 2015-05-17 NOTE — Progress Notes (Signed)
Subjective:    Patient ID: Charles Golden, male    DOB: [birth year], 81 y.o.   MRN: [id number]  CC: Rash   HPI: Charles Golden is a 81 y.o. male presenting for Rash  Was treated for tinea corpis 3 months ago with terbinafine after failing topical antifungals. He says rash improved with terbinafine, went away completely after two- 3 weeks. Had dirrhea 6 weeks ago, had a little bit of a rash after that, was wearing depends for incontinence for a few weeks. Last couple of days has started burning and hurting.    Depression screen [facility] 2/9 05/03/2015 02/15/2015 12/28/2014 12/02/2014 12/02/2014  Decreased Interest 0 0 0 0 0  Down, Depressed, Hopeless 0 0 0 0 0  PHQ - 2 Score 0 0 0 0 0     Relevant past medical, surgical, family and social history reviewed and updated as indicated. Interim medical history since our last visit reviewed. Allergies and medications reviewed and updated.    ROS: Per HPI unless specifically indicated above  History  Smoking status  . Former Smoker -- 1.00 packs/day for 30 years  . Types: Cigarettes  . Quit date: 05/28/2006  Smokeless tobacco  . Never Used    Past Medical History Patient Active Problem List   Diagnosis Date Noted  . Thoracic aortic atherosclerosis ([place]) 05/03/2015  . Upper airway cough syndrome 04/28/2015  . GERD (gastroesophageal reflux disease) 04/11/2015  . Tinea corporis 02/15/2015  . Acute medial meniscal tear 07/20/2014  . COPD GOLD I  12/08/2013  . Small bowel volvulus ([place]) 08/05/2013  . Chronic headache 08/05/2013  . Left inguinal hernia 08/05/2013  . Incisional hernia, without obstruction or gangrene 08/05/2013  . Bronchiectasis without acute exacerbation ([place]) 06/22/2013  . Headache(784.0) 05/01/2013  . Obstructive chronic bronchitis without exacerbation ([place]) 04/30/2013  . Left hip pain 04/29/2013  . BPH (benign prostatic hyperplasia) 01/05/2013  . IBS (irritable bowel syndrome) 09/03/2012  . Chronic rhinosinusitis  09/03/2012  . Lung nodules 05/04/2003    Current Outpatient Prescriptions  Medication Sig Dispense Refill  . aspirin 81 MG tablet Take 81 mg by mouth daily.      Marland Kitchen azelastine (ASTELIN) 0.1 % nasal spray PLACE 1 SPRAY INTO THE NOSE 1 DAY OR 1 DOSE. USE IN EACH NOSTRIL AS DIRECTED 30 mL 11  . Cholecalciferol (VITAMIN D3) 2000 UNITS TABS Take 1 tablet by mouth daily.    . Cyanocobalamin (VITAMIN B-12) 5000 MCG SUBL Place 1 tablet under the tongue daily.    Marland Kitchen doxazosin (CARDURA) 8 MG tablet Take 0.5-1 tablets (4-8 mg total) by mouth daily. 30 tablet 11  . fluticasone (FLONASE) 50 MCG/ACT nasal spray 1 spray in each nostril qd 16 g 11  . guaiFENesin (MUCINEX) 600 MG 12 hr tablet Take 600 mg by mouth 2 (two) times daily.     Marland Kitchen latanoprost (XALATAN) 0.005 % ophthalmic solution Place 1 drop into the left eye at bedtime.     . mometasone-formoterol (DULERA) 100-5 MCG/ACT AERO Take 2 puffs first thing in am and then another 2 puffs about 12 hours later. 13 g 11  . Probiotic Product (ALIGN) 4 MG CAPS Take 1 tablet by mouth daily.    Marland Kitchen terbinafine (LAMISIL) 250 MG tablet Take 1 tablet (250 mg total) by mouth daily. 21 tablet 0   No current facility-administered medications for this visit.       Objective:    BP 119/70 mmHg  Pulse 75  Temp(Src) 97.9 F (  36.6 C) (Oral)  Ht 5\' 10"  (1.778 m)  Wt 175 lb 3.2 oz (79.47 kg)  BMI 25.14 kg/m2  Wt Readings from Last 3 Encounters:  05/17/15 175 lb 3.2 oz (79.47 kg)  05/03/15 173 lb (78.472 kg)  04/28/15 176 lb 12.8 oz (80.196 kg)    Gen: NAD, alert, cooperative with exam, NCAT EYES: EOMI, no scleral injection or icterus ENT:  MMM CV: WWP Resp: normal WOB Ext: No edema, warm Neuro: Alert and oriented, strength equal b/l UE and LE, coordination grossly normal MSK: normal muscle bulk Skin: multiple 3 cm flat red rings coalescing across b/l anterior LE up to groin. No skin involvement of genitals.     Assessment & Plan:    [name] was seen today  for rash, likely tinea corporis. Returned after using depends-type garments for several days. No improvement with OTC topicals he has tried at home. Same rash improved with terbinafine 3 mo ago per pt. WIll restart terbinafine, but needs to RTC if not improving.  Diagnoses and all orders for this visit:  Tinea corporis -     terbinafine (LAMISIL) 250 MG tablet; Take 1 tablet (250 mg total) by mouth daily.    Follow up plan: Return in about 2 weeks (around 05/31/2015), or if symptoms worsen or fail to improve.  Assunta Found, MD [facility] 05/17/2015, 6:06 PM

## 2015-05-25 ENCOUNTER — Encounter: Payer: Self-pay | Admitting: Pediatrics

## 2015-05-25 ENCOUNTER — Ambulatory Visit (INDEPENDENT_AMBULATORY_CARE_PROVIDER_SITE_OTHER): Payer: Medicare Other | Admitting: Pediatrics

## 2015-05-25 VITALS — BP 110/64 | HR 77 | Temp 96.7°F | Ht 70.0 in | Wt 174.2 lb

## 2015-05-25 DIAGNOSIS — B354 Tinea corporis: Secondary | ICD-10-CM | POA: Diagnosis not present

## 2015-05-25 DIAGNOSIS — L853 Xerosis cutis: Secondary | ICD-10-CM

## 2015-05-25 NOTE — Patient Instructions (Signed)
Eucerin moisturizing cream in a jar

## 2015-05-25 NOTE — Progress Notes (Signed)
Subjective:    Patient ID: Charles Golden, male    DOB: 09-26-1925, 81 y.o.   MRN: [id number]  CC: Follow-up skin rash  HPI: Charles Golden is a 81 y.o. male presenting for Follow-up  Seen 1 week ago for return of anterior thigh flat erythematous rash with central clearing. Was treated in the past with terbinafine 3 months ago after topical antifungals didn't clear up rash.  Rash from last week he thinks is better, has been taking the terbinafine since the appt.  The day after the appt started having some rash across buttocks b/l, same rash as on anterior thighs, itches some, some burning.   Depression screen [facility] 2/9 05/25/2015 05/03/2015 02/15/2015 12/28/2014 12/02/2014  Decreased Interest 0 0 0 0 0  Down, Depressed, Hopeless 0 0 0 0 0  PHQ - 2 Score 0 0 0 0 0     Relevant past medical, surgical, family and social history reviewed and updated as indicated. Interim medical history since our last visit reviewed. Allergies and medications reviewed and updated.    ROS: Per HPI unless specifically indicated above  History  Smoking status  . Former Smoker -- 1.00 packs/day for 30 years  . Types: Cigarettes  . Quit date: 05/28/2006  Smokeless tobacco  . Never Used    Past Medical History Patient Active Problem List   Diagnosis Date Noted  . Thoracic aortic atherosclerosis ([place]) 05/03/2015  . Upper airway cough syndrome 04/28/2015  . GERD (gastroesophageal reflux disease) 04/11/2015  . Tinea corporis 02/15/2015  . Acute medial meniscal tear 07/20/2014  . COPD GOLD I  12/08/2013  . Small bowel volvulus ([place]) 08/05/2013  . Chronic headache 08/05/2013  . Left inguinal hernia 08/05/2013  . Incisional hernia, without obstruction or gangrene 08/05/2013  . Bronchiectasis without acute exacerbation ([place]) 06/22/2013  . Headache(784.0) 05/01/2013  . Obstructive chronic bronchitis without exacerbation ([place]) 04/30/2013  . Left hip pain 04/29/2013  . BPH (benign prostatic hyperplasia)  01/05/2013  . IBS (irritable bowel syndrome) 09/03/2012  . Chronic rhinosinusitis 09/03/2012  . Lung nodules 05/04/2003    Current Outpatient Prescriptions  Medication Sig Dispense Refill  . aspirin 81 MG tablet Take 81 mg by mouth daily.      Marland Kitchen azelastine (ASTELIN) 0.1 % nasal spray PLACE 1 SPRAY INTO THE NOSE 1 DAY OR 1 DOSE. USE IN EACH NOSTRIL AS DIRECTED 30 mL 11  . Cholecalciferol (VITAMIN D3) 2000 UNITS TABS Take 1 tablet by mouth daily.    . Cyanocobalamin (VITAMIN B-12) 5000 MCG SUBL Place 1 tablet under the tongue daily.    Marland Kitchen doxazosin (CARDURA) 8 MG tablet Take 0.5-1 tablets (4-8 mg total) by mouth daily. 30 tablet 11  . fluticasone (FLONASE) 50 MCG/ACT nasal spray 1 spray in each nostril qd 16 g 11  . latanoprost (XALATAN) 0.005 % ophthalmic solution Place 1 drop into the left eye at bedtime.     . mometasone-formoterol (DULERA) 100-5 MCG/ACT AERO Take 2 puffs first thing in am and then another 2 puffs about 12 hours later. 13 g 11  . Probiotic Product (ALIGN) 4 MG CAPS Take 1 tablet by mouth daily.    Marland Kitchen terbinafine (LAMISIL) 250 MG tablet Take 1 tablet (250 mg total) by mouth daily. 21 tablet 0  . guaiFENesin (MUCINEX) 600 MG 12 hr tablet Take 600 mg by mouth 2 (two) times daily. Reported on 05/25/2015     No current facility-administered medications for this visit.  Objective:    BP 110/64 mmHg  Pulse 77  Temp(Src) 96.7 F (35.9 C) (Oral)  Ht 5\' 10"  (1.778 m)  Wt 174 lb 3.2 oz (79.017 kg)  BMI 25.00 kg/m2  Wt Readings from Last 3 Encounters:  05/25/15 174 lb 3.2 oz (79.017 kg)  05/17/15 175 lb 3.2 oz (79.47 kg)  05/03/15 173 lb (78.472 kg)    Gen: NAD, alert, cooperative with exam, NCAT EYES: EOMI, no scleral injection or icterus CV: WWP Resp:normal WOB Ext: No edema, warm Neuro: Alert and oriented, strength equal b/l UE and LE, coordination grossly normal Skin: red patches on b/l anterior thighs, much improved from last week. Buttocks with multiple  coalescing flat red patches1-3 cm in diameter with central clearing, rim more erythematous. Some scale on skin consistent with xerosis across legs in areas not affected by rash.     Assessment & Plan:    Real was seen today for follow-up skin rash. SOme improvement, still with patches of invovled skin. TOok 2-3 weeks for rash to clear in September. Continue terbinafine, start moisturizing dry skin daily, RTC if not improved in 3 weeks.  Diagnoses and all orders for this visit:  Tinea corporis  Xerosis of skin    Follow up plan: Return if symptoms worsen or fail to improve.  Assunta Found, MD [facility] 05/25/2015, 12:29 PM

## 2015-05-31 DIAGNOSIS — L309 Dermatitis, unspecified: Secondary | ICD-10-CM | POA: Diagnosis not present

## 2015-06-07 DIAGNOSIS — Z0000 Encounter for general adult medical examination without abnormal findings: Secondary | ICD-10-CM | POA: Diagnosis not present

## 2015-06-07 DIAGNOSIS — N138 Other obstructive and reflux uropathy: Secondary | ICD-10-CM | POA: Diagnosis not present

## 2015-06-07 DIAGNOSIS — N401 Enlarged prostate with lower urinary tract symptoms: Secondary | ICD-10-CM | POA: Diagnosis not present

## 2015-06-07 DIAGNOSIS — R310 Gross hematuria: Secondary | ICD-10-CM | POA: Diagnosis not present

## 2015-06-09 ENCOUNTER — Telehealth: Payer: Self-pay | Admitting: Family Medicine

## 2015-06-09 MED ORDER — BUDESONIDE-FORMOTEROL FUMARATE 160-4.5 MCG/ACT IN AERO: 2.0000 | INHALATION_SPRAY | Freq: Two times a day (BID) | RESPIRATORY_TRACT | Status: DC

## 2015-06-09 NOTE — Telephone Encounter (Signed)
dulera is higher tier  Wants the symbicort back for cheaper cost.

## 2015-06-16 DIAGNOSIS — N401 Enlarged prostate with lower urinary tract symptoms: Secondary | ICD-10-CM | POA: Diagnosis not present

## 2015-06-16 DIAGNOSIS — R3912 Poor urinary stream: Secondary | ICD-10-CM | POA: Diagnosis not present

## 2015-06-16 DIAGNOSIS — N210 Calculus in bladder: Secondary | ICD-10-CM | POA: Diagnosis not present

## 2015-06-16 DIAGNOSIS — Z0000 Encounter for general adult medical examination without abnormal findings: Secondary | ICD-10-CM | POA: Diagnosis not present

## 2015-06-16 DIAGNOSIS — N138 Other obstructive and reflux uropathy: Secondary | ICD-10-CM | POA: Diagnosis not present

## 2015-06-16 DIAGNOSIS — R310 Gross hematuria: Secondary | ICD-10-CM | POA: Diagnosis not present

## 2015-06-22 ENCOUNTER — Ambulatory Visit (INDEPENDENT_AMBULATORY_CARE_PROVIDER_SITE_OTHER): Payer: Medicare Other | Admitting: Cardiology

## 2015-06-22 ENCOUNTER — Encounter: Payer: Self-pay | Admitting: Cardiology

## 2015-06-22 VITALS — BP 105/68 | HR 80 | Ht 70.5 in | Wt 182.0 lb

## 2015-06-22 DIAGNOSIS — R0600 Dyspnea, unspecified: Secondary | ICD-10-CM

## 2015-06-22 DIAGNOSIS — I2510 Atherosclerotic heart disease of native coronary artery without angina pectoris: Secondary | ICD-10-CM

## 2015-06-22 DIAGNOSIS — R931 Abnormal findings on diagnostic imaging of heart and coronary circulation: Secondary | ICD-10-CM

## 2015-06-22 NOTE — Patient Instructions (Signed)
Medication Instructions:  The current medical regimen is effective;  continue present plan and medications.  Testing/Procedures: Your physician has requested that you have an exercise tolerance test. For further information please visit [email or web address]. Please also follow instruction sheet, as given.  Follow-Up: Follow up as needed after testing.  If you need a refill on your cardiac medications before your next appointment, please call your pharmacy.  Thank you for choosing [facility]!!     

## 2015-06-22 NOTE — Progress Notes (Signed)
Cardiology Office Note   Date:  06/22/2015   ID:  SA[place], DOB 04-17-1926, MRN [id number]  PCP:  Redge Gainer, MD  Cardiologist:   Minus Breeding, MD   Chief Complaint  Patient presents with  . Coronary Calcium      History of Present Illness: Charles Golden is a 82 y.o. male who presents for evaluation of coronary calcium. He has no past cardiac history. He was a former smoker and had a CAT scan. He has some nodules and some mild COPD and also was found to have coronary calcium and calcium in his aorta. He's a little bit limited by some joint pain and dyspnea. He does some activities like helping around the house. With this he does get some shortness of breath but is not describing any PND or orthopnea. He's not been having any palpitations, presyncope or syncope. He has no chest pressure, neck or arm discomfort. He has no weight gain or edema.   Past Medical History  Diagnosis Date  . IBS (irritable bowel syndrome)   . Enlarged prostate   . Sinus congestion   . Sciatic pain   . COPD (chronic obstructive pulmonary disease) ([place])   . Glucagonoma   . HOH (hard of hearing)   . Hernia, incisional     at present  . Bronchitis, chronic ([place])   . GERD (gastroesophageal reflux disease)     Past Surgical History  Procedure Laterality Date  . Hemorrhoid surgery    . Laparotomy N/A 09/04/2012    Procedure: EXPLORATORY LAPAROTOMY;  Surgeon: Donato Heinz, MD;  Location: [facility];  Service: General;  Laterality: N/A;  . Bowel resection N/A 09/04/2012    Procedure: SMALL BOWEL RESECTION;  Surgeon: Donato Heinz, MD;  Location: [facility];  Service: General;  Laterality: N/A;  Anastimosis  . Hernia repair Right 70's  . Inguinal hernia repair Left 09/14/2013    Procedure: LEFT INGUINAL HERNIORRHAPHY;  Surgeon: Jamesetta So, MD;  Location: [facility];  Service: General;  Laterality: Left;  . Insertion of mesh Left 09/14/2013    Procedure: INSERTION OF MESH;  Surgeon: Jamesetta So,  MD;  Location: [facility];  Service: General;  Laterality: Left;  . Tonsillectomy    . Knee arthroscopy Left 07/21/2014    Procedure: LEFT KNEE ARTHROSCOPY WITH MEDIAL MENISCAL DEBRIDEMENT ;  Surgeon: Gearlean Alf, MD;  Location: [facility];  Service: Orthopedics;  Laterality: Left;     Current Outpatient Prescriptions  Medication Sig Dispense Refill  . aspirin 81 MG tablet Take 81 mg by mouth daily.      Marland Kitchen azelastine (ASTELIN) 0.1 % nasal spray PLACE 1 SPRAY INTO THE NOSE 1 DAY OR 1 DOSE. USE IN EACH NOSTRIL AS DIRECTED 30 mL 11  . budesonide-formoterol (SYMBICORT) 160-4.5 MCG/ACT inhaler Inhale 2 puffs into the lungs 2 (two) times daily. 1 Inhaler 3  . Cholecalciferol (VITAMIN D3) 2000 UNITS TABS Take 1 tablet by mouth daily.    . Cyanocobalamin (VITAMIN B-12) 5000 MCG SUBL Place 1 tablet under the tongue daily.    Marland Kitchen doxazosin (CARDURA) 8 MG tablet Take 0.5-1 tablets (4-8 mg total) by mouth daily. 30 tablet 11  . fluticasone (FLONASE) 50 MCG/ACT nasal spray 1 spray in each nostril qd 16 g 11  . guaiFENesin (MUCINEX) 600 MG 12 hr tablet Take 600 mg by mouth 2 (two) times daily. Reported on 05/25/2015    . latanoprost (XALATAN) 0.005 % ophthalmic solution Place 1  drop into the left eye at bedtime.     . Probiotic Product (ALIGN) 4 MG CAPS Take 1 tablet by mouth daily.     No current facility-administered medications for this visit.    Allergies:   Demerol and Meperidine hcl    Social History:  The patient  reports that he quit smoking about 9 years ago. His smoking use included Cigarettes. He has a 30 pack-year smoking history. He has never used smokeless tobacco. He reports that he drinks alcohol. He reports that he does not use illicit drugs.   Family History:  [name]  family history includes Colon cancer in his father; Heart disease in his mother.    ROS:  Please see the history of present illness.   Otherwise, review of systems are positive for headaches, decreased hearing,  reflux, diarrhea and constipation.   All other systems are reviewed and negative.    PHYSICAL EXAM: VS:  BP 105/68 mmHg  Pulse 80  Ht 5' 10.5" (1.791 m)  Wt 182 lb (82.555 kg)  BMI 25.74 kg/m2 , BMI Body mass index is 25.74 kg/(m^2). GENERAL:  Well appearing HEENT:  Pupils equal round and reactive, fundi not visualized, oral mucosa unremarkable NECK:  No jugular venous distention, waveform within normal limits, carotid upstroke brisk and symmetric, no bruits, no thyromegaly LYMPHATICS:  No cervical, inguinal adenopathy LUNGS:  Clear to auscultation bilaterally BACK:  No CVA tenderness CHEST:  Unremarkable HEART:  PMI not displaced or sustained,S1 and S2 within normal limits, no S3, no S4, no clicks, no rubs, no murmurs ABD:  Flat, positive bowel sounds normal in frequency in pitch, no bruits, no rebound, no guarding, no midline pulsatile mass, no hepatomegaly, no splenomegaly EXT:  2 plus pulses throughout, no edema, no cyanosis no clubbing SKIN:  No rashes no nodules NEURO:  Cranial nerves II through XII grossly intact, motor grossly intact throughout PSYCH:  Cognitively intact, oriented to person place and time    EKG:  EKG is ordered today. The ekg ordered today demonstrates sinus rhythm, rate 70, axis within normal limits, intervals within normal limits, no acute ST-T wave changes.   Recent Labs: 08/23/2014: Hemoglobin 13.3* 05/03/2015: ALT 9; BUN 22; Creatinine, Ser 1.48*; Platelets 299; Potassium 4.4; Sodium 143; TSH 1.730    Lipid Panel    Component Value Date/Time   CHOL 147 05/03/2015 1557   CHOL 170 01/06/2015 0812   TRIG 95 05/03/2015 1557   TRIG 71 01/06/2015 0812   HDL 40 05/03/2015 1557   HDL 58 01/06/2015 0812   CHOLHDL 3.7 05/03/2015 1557   LDLCALC 88 05/03/2015 1557   LDLCALC 80 12/17/2013 0820      Wt Readings from Last 3 Encounters:  06/22/15 182 lb (82.555 kg)  05/25/15 174 lb 3.2 oz (79.017 kg)  05/17/15 175 lb 3.2 oz (79.47 kg)      Other  studies Reviewed: Additional studies/ records that were reviewed today include: CT scan. Review of the above records demonstrates:  Please see elsewhere in the note.     ASSESSMENT AND PLAN:  CORONARY CALCIUM:  He does have dyspnea which is likely related to his lung disease. However, with his coronary calcium it would be reasonable screen him with a POET (Plain Old Exercise Treadmill).  He thinks he be able to do this. We will start with a Bruce protocol although given his age it might need to be switched to modified Bruce at the time of the procedure.   Current medicines  are reviewed at length with the patient today.  The patient does not have concerns regarding medicines.  The following changes have been made:  no change  Labs/ tests ordered today include:   Orders Placed This Encounter  Procedures  . Exercise Tolerance Test  . EKG 12-Lead     Disposition:   FU with me as needed and based on the above.     Signed, Minus Breeding, MD  06/22/2015 12:03 PM    [place]

## 2015-06-24 ENCOUNTER — Telehealth (HOSPITAL_COMMUNITY): Payer: Self-pay

## 2015-06-24 NOTE — Telephone Encounter (Signed)
Encounter complete. 

## 2015-06-29 ENCOUNTER — Ambulatory Visit (HOSPITAL_COMMUNITY)
Admission: RE | Admit: 2015-06-29 | Discharge: 2015-06-29 | Disposition: A | Discharge status: Home / Self Care | Payer: Medicare Other | Source: Ambulatory Visit | Attending: Cardiovascular Disease | Admitting: Cardiovascular Disease

## 2015-06-29 DIAGNOSIS — R0600 Dyspnea, unspecified: Secondary | ICD-10-CM

## 2015-06-29 DIAGNOSIS — R931 Abnormal findings on diagnostic imaging of heart and coronary circulation: Secondary | ICD-10-CM

## 2015-06-29 DIAGNOSIS — I2510 Atherosclerotic heart disease of native coronary artery without angina pectoris: Secondary | ICD-10-CM | POA: Diagnosis not present

## 2015-06-29 LAB — EXERCISE TOLERANCE TEST
CHL RATE OF PERCEIVED EXERTION: 17
CSEPED: 4 min
CSEPEW: 4.6 METS
CSEPHR: 98 %
MPHR: 133 {beats}/min
Peak HR: 131 {beats}/min
Rest HR: 85 {beats}/min

## 2015-07-13 ENCOUNTER — Ambulatory Visit (INDEPENDENT_AMBULATORY_CARE_PROVIDER_SITE_OTHER): Payer: Medicare Other | Admitting: Pulmonary Disease

## 2015-07-13 ENCOUNTER — Encounter: Payer: Self-pay | Admitting: Pulmonary Disease

## 2015-07-13 VITALS — BP 122/62 | HR 77 | Ht 70.5 in | Wt 173.8 lb

## 2015-07-13 DIAGNOSIS — R918 Other nonspecific abnormal finding of lung field: Secondary | ICD-10-CM

## 2015-07-13 DIAGNOSIS — R0600 Dyspnea, unspecified: Secondary | ICD-10-CM

## 2015-07-13 DIAGNOSIS — I2510 Atherosclerotic heart disease of native coronary artery without angina pectoris: Secondary | ICD-10-CM | POA: Diagnosis not present

## 2015-07-13 DIAGNOSIS — J449 Chronic obstructive pulmonary disease, unspecified: Secondary | ICD-10-CM | POA: Diagnosis not present

## 2015-07-13 DIAGNOSIS — J42 Unspecified chronic bronchitis: Secondary | ICD-10-CM | POA: Diagnosis not present

## 2015-07-13 DIAGNOSIS — K219 Gastro-esophageal reflux disease without esophagitis: Secondary | ICD-10-CM

## 2015-07-13 DIAGNOSIS — J479 Bronchiectasis, uncomplicated: Secondary | ICD-10-CM

## 2015-07-13 LAB — PULMONARY FUNCTION TEST
DL/VA % PRED: 74 %
DL/VA: 3.42 ml/min/mmHg/L
DLCO COR % PRED: 75 %
DLCO UNC % PRED: 70 %
DLCO cor: 24.8 ml/min/mmHg
DLCO unc: 23.37 ml/min/mmHg
FEF 25-75 PRE: 1.17 L/s
FEF 25-75 Post: 1.97 L/sec
FEF2575-%CHANGE-POST: 69 %
FEF2575-%Pred-Post: 121 %
FEF2575-%Pred-Pre: 71 %
FEV1-%CHANGE-POST: 12 %
FEV1-%Pred-Post: 104 %
FEV1-%Pred-Pre: 92 %
FEV1-PRE: 2.4 L
FEV1-Post: 2.71 L
FEV1FVC-%CHANGE-POST: 3 %
FEV1FVC-%Pred-Pre: 94 %
FEV6-%CHANGE-POST: 9 %
FEV6-%PRED-PRE: 101 %
FEV6-%Pred-Post: 111 %
FEV6-PRE: 3.54 L
FEV6-Post: 3.87 L
FEV6FVC-%Change-Post: 0 %
FEV6FVC-%PRED-PRE: 104 %
FEV6FVC-%Pred-Post: 104 %
FVC-%CHANGE-POST: 9 %
FVC-%Pred-Post: 105 %
FVC-%Pred-Pre: 96 %
FVC-Post: 3.97 L
FVC-Pre: 3.63 L
POST FEV6/FVC RATIO: 97 %
PRE FEV6/FVC RATIO: 97 %
Post FEV1/FVC ratio: 68 %
Pre FEV1/FVC ratio: 66 %
RV % PRED: 143 %
RV: 4.08 L
TLC % pred: 112 %
TLC: 8.07 L

## 2015-07-13 MED ORDER — ACAPELLA MISC: Status: DC

## 2015-07-13 MED ORDER — AEROCHAMBER Z-STAT PLUS MISC: Status: DC

## 2015-07-13 MED ORDER — RANITIDINE HCL 150 MG PO TABS
150.0000 mg | ORAL_TABLET | Freq: Every day | ORAL | Status: DC
Start: 2015-07-13 — End: 2015-09-14

## 2015-07-13 NOTE — Progress Notes (Signed)
Result reviewed. 

## 2015-07-13 NOTE — Progress Notes (Signed)
Subjective:    Patient ID: Charles Golden, male    DOB: 1926-05-14, 82 y.o.   MRN: [id number]  C.C.:  Follow-up for Chronic Bronchitis/Mild Bronchiectasis, Lingula Nodules, & GERD.  HPI Chronic Bronchitis/Mild Bronchiectasis:  [name] sputum culture was negative except for oral flora. Patient transitioned to lower dose of Dulera at 04/28/15 appointment. However, he reports he has been taking Symbicort. He hasn't been taking it twice daily as prescribed. He reports he continues to have fatigue & dyspnea on exertion. He reports he is producing 1/4-1/2 cup daily sputum. He reports it ranges from yellow to gray to clear. No hemoptysis.   Lingula Nodules: Nodules seen on CT imaging from 2004 with at least some calcification & subcentimeter in size. No worrisome developing nodular opacities on repeat CT imaging from November 2016.  GERD:  Started on Zantac at last appointment. Patient previously intolerant of Zantac at follow-up appointment in December but feels it may have been helping. He reports he has had some cramping recently as well as diarrhea. He has noticed more reflux since he stopped using Zantac.  Review of Systems He denies any fever or chills. He does have some night sweats that are chronic. He reports he does continue to have intermittent headaches relieved with Caffeine & Excedrin Migraine.   Allergies  Allergen Reactions  . Demerol [Meperidine] Other (See Comments)    Pt. States "he woke up during a colonoscopy"  . Meperidine Hcl Other (See Comments)    Unknown     Current Outpatient Prescriptions on File Prior to Visit  Medication Sig Dispense Refill  . aspirin 81 MG tablet Take 81 mg by mouth daily.      Marland Kitchen azelastine (ASTELIN) 0.1 % nasal spray PLACE 1 SPRAY INTO THE NOSE 1 DAY OR 1 DOSE. USE IN EACH NOSTRIL AS DIRECTED 30 mL 11  . budesonide-formoterol (SYMBICORT) 160-4.5 MCG/ACT inhaler Inhale 2 puffs into the lungs 2 (two) times daily. 1 Inhaler 3  .  Cholecalciferol (VITAMIN D3) 2000 UNITS TABS Take 1 tablet by mouth daily.    . Cyanocobalamin (VITAMIN B-12) 5000 MCG SUBL Place 1 tablet under the tongue daily.    Marland Kitchen doxazosin (CARDURA) 8 MG tablet Take 0.5-1 tablets (4-8 mg total) by mouth daily. 30 tablet 11  . fluticasone (FLONASE) 50 MCG/ACT nasal spray 1 spray in each nostril qd 16 g 11  . guaiFENesin (MUCINEX) 600 MG 12 hr tablet Take 600 mg by mouth as needed. Reported on 05/25/2015    . latanoprost (XALATAN) 0.005 % ophthalmic solution Place 1 drop into the left eye at bedtime.     . Probiotic Product (ALIGN) 4 MG CAPS Take 1 tablet by mouth daily.     No current facility-administered medications on file prior to visit.    Past Medical History  Diagnosis Date  . IBS (irritable bowel syndrome)   . Enlarged prostate   . Sinus congestion   . Sciatic pain   . COPD (chronic obstructive pulmonary disease) ([place])   . Glucagonoma   . HOH (hard of hearing)   . Hernia, incisional     at present  . Bronchitis, chronic ([place])   . GERD (gastroesophageal reflux disease)     Past Surgical History  Procedure Laterality Date  . Hemorrhoid surgery    . Laparotomy N/A 09/04/2012    Procedure: EXPLORATORY LAPAROTOMY;  Surgeon: Donato Heinz, MD;  Location: [facility];  Service: General;  Laterality: N/A;  . Bowel resection N/A 09/04/2012  Procedure: SMALL BOWEL RESECTION;  Surgeon: Donato Heinz, MD;  Location: [facility];  Service: General;  Laterality: N/A;  Anastimosis  . Hernia repair Right 70's  . Inguinal hernia repair Left 09/14/2013    Procedure: LEFT INGUINAL HERNIORRHAPHY;  Surgeon: Jamesetta So, MD;  Location: [facility];  Service: General;  Laterality: Left;  . Insertion of mesh Left 09/14/2013    Procedure: INSERTION OF MESH;  Surgeon: Jamesetta So, MD;  Location: [facility];  Service: General;  Laterality: Left;  . Tonsillectomy    . Knee arthroscopy Left 07/21/2014    Procedure: LEFT KNEE ARTHROSCOPY WITH MEDIAL MENISCAL DEBRIDEMENT ;   Surgeon: Gearlean Alf, MD;  Location: [facility];  Service: Orthopedics;  Laterality: Left;    Family History  Problem Relation Age of Onset  . Asthma      cousin  . Heart disease Mother     Valve replacement and pacemaker  . Colon cancer Father     Social History   Social History  . Marital Status: Married    Spouse Name: [name]  . Number of Children: 3  . Years of Education: 12+   Occupational History  . Retired     Immunologist   Social History Main Topics  . Smoking status: Former Smoker -- 1.00 packs/day for 30 years    Types: Cigarettes    Quit date: 05/28/2006  . Smokeless tobacco: Never Used  . Alcohol Use: 0.0 oz/week    0 Standard drinks or equivalent per week     Comment: Rare beer or wine.  . Drug Use: No  . Sexual Activity: Yes    Birth Control/ Protection: None   Other Topics Concern  . None   Social History Narrative   Patient lives at home with [name].    Patient has 3 children.    Patient has his BS   Patient is retired.       [facility]:   He is still married. 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 He is from [place].             Objective:   Physical Exam BP 122/62 mmHg  Pulse 77  Ht 5' 10.5" (1.791 m)  Wt 173 lb 12.8 oz (78.835 kg)  BMI 24.58 kg/m2  SpO2 97% General:  Elderly male. Awake. Alert. No acute distress.  Integument:  Warm & dry. No rash on exposed skin. No bruising. HEENT:  Moist mucus membranes. No oral ulcers. No nasal turbinate swelling. Cardiovascular:  Regular rate & rhythm. No edema.   Pulmonary:  Clear bilaterally to auscultation. Symmetric chest wall expansion. No accessory muscle use on room air. Abdomen: Soft. Normal bowel sounds. Nondistended. Grossly nontender. Musculoskeletal:  Normal bulk and tone. No joint deformity or effusion appreciated.  PFT 07/13/15: FVC 3.63 L (96%) FEV1 2.40 L (92%) FEV1/FVC 0.66 FEF  25-75 1.17 L (71%) positive bronchodilator response TLC 8.07 L (112%) RV 143% ERV 106% DLCO corrected 75%  06/22/13: FVC 3.74 L (89%) FEV1 2.39 L (77%) FEV1/FVC 0.64 FEF 25-75 1.17 L (46%)  6MWT 07/13/15:  Walked 360 meters / Baseline Sat 97% on RA / Nadir Sat 97% on RA   IMAGING CT CHEST W/O 04/19/15 (personally reviewed by me): Mild centrilobular emphysematous changes. Subcentimeter peripheral left upper lobe nodules noted. Small hiatal hernia. No pericardial effusion. No pleural effusion or thickening. Calcified nodules within spleen noted.  CT CHEST W/O 05/04/03 (previously reviewed by me): Mild cylindrical bronchiectasis with linear opacity particularly in the right lower lobe suggestive of scar formation. Some hilar node calcification especially on the left. 2 separate subcentimeter nodules within the inferior segment of the lingula one of which is at least partially calcified. No pleural effusion or thickening appreciated. No pericardial effusion.  MICROBIOLOGY Sputum (04/19/15): Oral flora/AFB negative/fungal negative  LABS 04/11/15 Alpha-1 antitrypsin: MM (153) IgM: 31 IgA: 195 IgG: 822    Assessment & Plan:  82 year old male with a long history of obstructive chronic bronchitis.  spirometry today continues to show mild airways obstruction with a significant bronchodilator response. There is been no significant change since previous testing. His 6 minute walk test shows no evidence of desaturation with ambulation which is encouraging. Patient's ongoing cough likely secondary to his bronchitis and bronchiectasis. I reviewed his CT scan which showed lung nodules previously. His reflux seems to worsen since he stopped taking Zantac. This was stopped for unclear reasons at this time. We will try him again on this medication. I instructed the patient contact my office if he had any new breathing problems or change in his mucus before his next appointment.   1. Obstructive chronic  bronchitis/Mild COPD: Continuing patient on Symbicort 160/4.5. Giving the patienther to use with his inhaler. Also starting the patient on a flutter valve twice daily. Repeat spirometry with bronchodilator challenge next appointment to ensure maximal bronchodilatation.  2. Lingula nodules: No new significant nodularity at this time. No need for further testing. 3. GERD: Restarting Zantac 150 mg by mouth daily at bedtime. Patient counseled to avoid eating within 2 hours of bedtime. 4. Follow-up: Patient to return to clinic in 6 months or sooner if needed.

## 2015-07-13 NOTE — Patient Instructions (Signed)
1. Use your spacer with your Symbicort inhaler. 2. Call me if you have any worsening in your cough, it turns yellow consistently or green, or you start having a fever/chills. 3. Use your flutter valve by blowing into it 3 times in a row twice daily. 4. We will do breathing tests such as chest appointment. 5. I will see you back in 6 months or sooner if needed.  TESTS ORDERED: 1. Spirometry with bronchodilator challenge next appointment

## 2015-07-13 NOTE — Progress Notes (Signed)
PFT done today. 

## 2015-09-02 DIAGNOSIS — R51 Headache: Secondary | ICD-10-CM | POA: Diagnosis not present

## 2015-09-02 DIAGNOSIS — R490 Dysphonia: Secondary | ICD-10-CM | POA: Diagnosis not present

## 2015-09-02 DIAGNOSIS — D487 Neoplasm of uncertain behavior of other specified sites: Secondary | ICD-10-CM | POA: Diagnosis not present

## 2015-09-02 DIAGNOSIS — J342 Deviated nasal septum: Secondary | ICD-10-CM | POA: Diagnosis not present

## 2015-09-02 DIAGNOSIS — K219 Gastro-esophageal reflux disease without esophagitis: Secondary | ICD-10-CM | POA: Diagnosis not present

## 2015-09-03 DIAGNOSIS — J342 Deviated nasal septum: Secondary | ICD-10-CM | POA: Insufficient documentation

## 2015-09-03 DIAGNOSIS — C44311 Basal cell carcinoma of skin of nose: Secondary | ICD-10-CM | POA: Insufficient documentation

## 2015-09-14 ENCOUNTER — Ambulatory Visit (INDEPENDENT_AMBULATORY_CARE_PROVIDER_SITE_OTHER): Payer: Medicare Other | Admitting: Family Medicine

## 2015-09-14 ENCOUNTER — Encounter: Payer: Self-pay | Admitting: Family Medicine

## 2015-09-14 VITALS — BP 107/67 | HR 72 | Temp 97.0°F | Ht 70.5 in | Wt 171.0 lb

## 2015-09-14 DIAGNOSIS — R5383 Other fatigue: Secondary | ICD-10-CM | POA: Diagnosis not present

## 2015-09-14 DIAGNOSIS — K432 Incisional hernia without obstruction or gangrene: Secondary | ICD-10-CM

## 2015-09-14 DIAGNOSIS — R319 Hematuria, unspecified: Secondary | ICD-10-CM | POA: Diagnosis not present

## 2015-09-14 DIAGNOSIS — J449 Chronic obstructive pulmonary disease, unspecified: Secondary | ICD-10-CM

## 2015-09-14 DIAGNOSIS — K219 Gastro-esophageal reflux disease without esophagitis: Secondary | ICD-10-CM | POA: Diagnosis not present

## 2015-09-14 DIAGNOSIS — J479 Bronchiectasis, uncomplicated: Secondary | ICD-10-CM | POA: Diagnosis not present

## 2015-09-14 DIAGNOSIS — I2510 Atherosclerotic heart disease of native coronary artery without angina pectoris: Secondary | ICD-10-CM | POA: Diagnosis not present

## 2015-09-14 DIAGNOSIS — I700 Atherosclerosis of aorta: Secondary | ICD-10-CM

## 2015-09-14 DIAGNOSIS — R05 Cough: Secondary | ICD-10-CM | POA: Diagnosis not present

## 2015-09-14 DIAGNOSIS — J324 Chronic pansinusitis: Secondary | ICD-10-CM | POA: Diagnosis not present

## 2015-09-14 DIAGNOSIS — N400 Enlarged prostate without lower urinary tract symptoms: Secondary | ICD-10-CM

## 2015-09-14 DIAGNOSIS — Z0000 Encounter for general adult medical examination without abnormal findings: Secondary | ICD-10-CM

## 2015-09-14 DIAGNOSIS — R058 Other specified cough: Secondary | ICD-10-CM

## 2015-09-14 DIAGNOSIS — E559 Vitamin D deficiency, unspecified: Secondary | ICD-10-CM

## 2015-09-14 NOTE — Patient Instructions (Addendum)
Medicare Annual Wellness Visit  [facility] and the medical providers at [facility] strive to bring you the best medical care.  In doing so we not only want to address your current medical conditions and concerns but also to detect new conditions early and prevent illness, disease and health-related problems.    Medicare offers a yearly Wellness Visit which allows our clinical staff to assess your need for preventative services including immunizations, lifestyle education, counseling to decrease risk of preventable diseases and screening for fall risk and other medical concerns.    This visit is provided free of charge (no copay) for all Medicare recipients. The clinical pharmacists at [facility] have begun to conduct these Wellness Visits which will also include a thorough review of all your medications.    As you primary medical provider recommend that you make an appointment for your Annual Wellness Visit if you have not done so already this year.  You may set up this appointment before you leave today or you may call back [phone]) and schedule an appointment.  Please make sure when you call that you mention that you are scheduling your Annual Wellness Visit with the clinical pharmacist so that the appointment may be made for the proper length of time.     Continue current medications. Continue good therapeutic lifestyle changes which include good diet and exercise. Fall precautions discussed with patient. If an FOBT was given today- please return it to our front desk. If you are over 51 years old - you may need Prevnar 39 or the adult Pneumonia vaccine.  **Flu shots are available--- please call and schedule a FLU-CLINIC appointment**  After your visit with [place] today you will receive a survey in the mail or online from [employer] regarding your care with [place]. Please take a moment to fill this out. Your feedback is very  important to [place] as you can help [place] better understand your patient needs as well as improve your experience and satisfaction. WE CARE ABOUT YOU!!!   The patient should follow-up as planned with his pulmonologist He should use the Symbicort 2 puffs twice a day and rinse the mouth after using and use the spacer as directed He should take Mucinex maximum strength plain and blue and white in color and take one twice a day with a large glass of water He should use nasal saline frequently in each nostril during the day He should bring his inhaler by with the spacer if he has any questions about how to use this. He should follow-up with the gastroenterologist if the loose bowel movements did not improve with the medication recommended by the gastroenterologist along with taking some Imodium. Because of his weakness he should restart his boost The patient should continue to monitor his urine for blood in the urine and follow-up with the urologist as planned for further evaluation If he develops any abdominal pain with his incisional hernia he should go to the emergency room immediately otherwise keep an eye on this.

## 2015-09-14 NOTE — Addendum Note (Signed)
Addended by: Zannie Cove on: 09/14/2015 03:08 PM   Modules accepted: Orders, SmartSet

## 2015-09-14 NOTE — Progress Notes (Signed)
Subjective:    Patient ID: Charles Golden, male    DOB: 06/21/1925, 82 y.o.   MRN: [id number]  HPI Pt here for follow up and management of chronic medical problems which includes COPD. He is taking medications regularly.The patient has been seen by the pulmonologist, Dr. Ashok Cordia and he is also being seen and followed by Dr. Erik Obey, ENT. The patient has a history of COPD and chronic rhinosinusitis. He has headaches every 9-10 days. He complains of fatigue and weakness. He will return to the office when he is fasting to get lab work. He gets his rectal exams done by the urologist. The recent note from the pulmonologist was reviewed. The patient comes to the visit today with many complaints. He documents problems with a rash from December which has healed. He documents these he has had 2 episodes of blood in the urine one episode in January and another episode on April 2. He has a follow-up appointment with the urologist. He has seen the pulmonologist and does have a follow-up appointment with him in about 6 months. He did see the cardiologist and had a stress test and this was good. He's been having more headaches and has had an appointment with ear nose and throat and it sounds like he had a laryngoscopy with no significant findings. The nose and throat specialist did put him on some Prilosec and has him plan to come back to remove a basal cell skin cancer from his nose. The patient denies any chest pain but does just feel weak and has shortness of breath. He is having loose bowel movements but is taking a treatment recommended by the gastroenterologist and plans to follow-up with him if this is not improved. He denies any burning when he passes his water but just the second episode of blood in the urine. He has taken some boost in the past and this helped energize him and he will retry the boost nourishment to see if this can help.     Patient Active Problem List   Diagnosis Date Noted  . Thoracic  aortic atherosclerosis ([place]) 05/03/2015  . Upper airway cough syndrome 04/28/2015  . GERD (gastroesophageal reflux disease) 04/11/2015  . Tinea corporis 02/15/2015  . Acute medial meniscal tear 07/20/2014  . COPD GOLD I  12/08/2013  . Small bowel volvulus ([place]) 08/05/2013  . Chronic headache 08/05/2013  . Left inguinal hernia 08/05/2013  . Incisional hernia, without obstruction or gangrene 08/05/2013  . Bronchiectasis without acute exacerbation ([place]) 06/22/2013  . Headache(784.0) 05/01/2013  . Obstructive chronic bronchitis without exacerbation ([place] ) 04/30/2013  . Left hip pain 04/29/2013  . BPH (benign prostatic hyperplasia) 01/05/2013  . IBS (irritable bowel syndrome) 09/03/2012  . Chronic rhinosinusitis 09/03/2012  . Lung nodules 05/04/2003   Outpatient Encounter Prescriptions as of 09/14/2015  Medication Sig  . aspirin 81 MG tablet Take 81 mg by mouth daily.    Marland Kitchen azelastine (ASTELIN) 0.1 % nasal spray PLACE 1 SPRAY INTO THE NOSE 1 DAY OR 1 DOSE. USE IN EACH NOSTRIL AS DIRECTED  . budesonide-formoterol (SYMBICORT) 160-4.5 MCG/ACT inhaler Inhale 2 puffs into the lungs 2 (two) times daily.  . Cholecalciferol (VITAMIN D3) 2000 UNITS TABS Take 1 tablet by mouth daily.  . Cyanocobalamin (VITAMIN B-12) 5000 MCG SUBL Place 1 tablet under the tongue daily.  Marland Kitchen doxazosin (CARDURA) 8 MG tablet Take 0.5-1 tablets (4-8 mg total) by mouth daily.  . fluticasone (FLONASE) 50 MCG/ACT nasal spray 1 spray in each nostril qd  .  guaiFENesin (MUCINEX) 600 MG 12 hr tablet Take 600 mg by mouth as needed. Reported on 05/25/2015  . latanoprost (XALATAN) 0.005 % ophthalmic solution Place 1 drop into the left eye at bedtime.   . Misc. Devices (ACAPELLA) MISC Use as directed  . Probiotic Product (ALIGN) 4 MG CAPS Take 1 tablet by mouth daily.  Marland Kitchen Spacer/Aero-Holding Chambers (AEROCHAMBER Z-STAT PLUS) inhaler Use as instructed  . [DISCONTINUED] ranitidine (ZANTAC) 150 MG tablet Take 1 tablet (150 mg total) by  mouth at bedtime.  Marland Kitchen omeprazole (PRILOSEC) 20 MG capsule Take 1 capsule by mouth 2 (two) times daily.   No facility-administered encounter medications on file as of 09/14/2015.      Review of Systems  Constitutional: Positive for fatigue.  HENT: Positive for congestion and voice change.   Eyes: Negative.   Respiratory: Negative.   Cardiovascular: Negative.   Gastrointestinal: Negative.   Endocrine: Negative.   Genitourinary: Negative.   Musculoskeletal: Negative.   Skin: Negative.   Allergic/Immunologic: Negative.   Neurological: Positive for weakness and headaches (has a HA almost everyday).  Hematological: Negative.   Psychiatric/Behavioral: Negative.        Objective:   Physical Exam  Constitutional: He is oriented to person, place, and time. He appears well-developed and well-nourished. He appears distressed.  The patient looks quite good for his age of 82 years. All of these medical issues do seem to be stressing him somewhat.  HENT:  Head: Normocephalic and atraumatic.  Right Ear: External ear normal.  Left Ear: External ear normal.  Mouth/Throat: Oropharynx is clear and moist. No oropharyngeal exudate.  Nasal congestion left greater than right. Throat is clear of drainage.  Eyes: Conjunctivae and EOM are normal. Pupils are equal, round, and reactive to light. Right eye exhibits no discharge. Left eye exhibits no discharge. No scleral icterus.  Neck: Normal range of motion. Neck supple. No thyromegaly present.  Cardiovascular: Normal rate, regular rhythm and normal heart sounds.   No murmur heard. The heart is 72/m with a regular rate and rhythm  Pulmonary/Chest: Effort normal and breath sounds normal. No respiratory distress. He has no wheezes. He has no rales. He exhibits no tenderness.  Fairly good breath sounds bilaterally and anteriorly and posteriorly with no wheezes rales or rhonchi with coughing.  Abdominal: Soft. Bowel sounds are normal. He exhibits no mass.  There is no tenderness. There is no rebound and no guarding.  Large incisional hernia  Musculoskeletal: Normal range of motion. He exhibits no edema.  Lymphadenopathy:    He has no cervical adenopathy.  Neurological: He is alert and oriented to person, place, and time. He has normal reflexes. No cranial nerve deficit.  Skin: Skin is warm and dry. No rash noted.  Psychiatric: He has a normal mood and affect. His behavior is normal. Judgment and thought content normal.  All of the medical conditions are currently stressing him considerably.  Nursing note and vitals reviewed.  BP 107/67 mmHg  Pulse 72  Temp(Src) 97 F (36.1 C) (Oral)  Ht 5' 10.5" (1.791 m)  Wt 171 lb (77.565 kg)  BMI 24.18 kg/m2        Assessment & Plan:  1. Vitamin D deficiency -Continue current treatment pending results of lab work - CBC with Differential/Platelet; Future - VITAMIN D 25 Hydroxy (Vit-D Deficiency, Fractures); Future  2. BPH (benign prostatic hyperplasia) -Follow-up with urology as planned - CBC with Differential/Platelet; Future  3. Chronic obstructive pulmonary disease, unspecified COPD, unspecified chronic bronchitis type -Follow  up with pulmonology as planned - CBC with Differential/Platelet; Future  4. Health care maintenance -Return to clinic for fasting lab work - BMP8+EGFR; Future - CBC with Differential/Platelet; Future - Hepatic function panel; Future - NMR, lipoprofile; Future  5. Thoracic aortic atherosclerosis ([place]) -Continue with aggressive therapeutic lifestyle changes which include diet and exercise  6. Upper airway cough syndrome -Follow-up with pulmonology as planned  7. Gastroesophageal reflux disease, esophagitis presence not specified -Continue the Prilosec  8. COPD GOLD I  -Use inhalers as directed and take Mucinex and follow-up with pulmonology as planned  9. Bronchiectasis without complication ([place]) -Follow up with pulmonology  10. Obstructive chronic  bronchitis without exacerbation ([place]) -Follow up with pulmonology  11. Chronic pansinusitis -Follow-up with ear nose and throat  12. Hematuria -Follow-up with urology  13. Incisional hernia, without obstruction or gangrene -Continue to monitor this and if any pain go to the emergency room immediately  Patient Instructions                       Medicare Annual Wellness Visit  [facility] and the medical providers at [facility] strive to bring you the best medical care.  In doing so we not only want to address your current medical conditions and concerns but also to detect new conditions early and prevent illness, disease and health-related problems.    Medicare offers a yearly Wellness Visit which allows our clinical staff to assess your need for preventative services including immunizations, lifestyle education, counseling to decrease risk of preventable diseases and screening for fall risk and other medical concerns.    This visit is provided free of charge (no copay) for all Medicare recipients. The clinical pharmacists at [facility] have begun to conduct these Wellness Visits which will also include a thorough review of all your medications.    As you primary medical provider recommend that you make an appointment for your Annual Wellness Visit if you have not done so already this year.  You may set up this appointment before you leave today or you may call back ([phone]) and schedule an appointment.  Please make sure when you call that you mention that you are scheduling your Annual Wellness Visit with the clinical pharmacist so that the appointment may be made for the proper length of time.     Continue current medications. Continue good therapeutic lifestyle changes which include good diet and exercise. Fall precautions discussed with patient. If an FOBT was given today- please return it to our front desk. If you are over 98 years old  - you may need Prevnar 9 or the adult Pneumonia vaccine.  **Flu shots are available--- please call and schedule a FLU-CLINIC appointment**  After your visit with [place] today you will receive a survey in the mail or online from [employer] regarding your care with [place]. Please take a moment to fill this out. Your feedback is very important to [place] as you can help [place] better understand your patient needs as well as improve your experience and satisfaction. WE CARE ABOUT YOU!!!   The patient should follow-up as planned with his pulmonologist He should use the Symbicort 2 puffs twice a day and rinse the mouth after using and use the spacer as directed He should take Mucinex maximum strength plain and blue and white in color and take one twice a day with a large glass of water He should use nasal saline frequently in each  nostril during the day He should bring his inhaler by with the spacer if he has any questions about how to use this. He should follow-up with the gastroenterologist if the loose bowel movements did not improve with the medication recommended by the gastroenterologist along with taking some Imodium. Because of his weakness he should restart his boost The patient should continue to monitor his urine for blood in the urine and follow-up with the urologist as planned for further evaluation If he develops any abdominal pain with his incisional hernia he should go to the emergency room immediately otherwise keep an eye on this.   Arrie Senate MD

## 2015-09-19 ENCOUNTER — Other Ambulatory Visit: Payer: Medicare Other

## 2015-09-19 DIAGNOSIS — N401 Enlarged prostate with lower urinary tract symptoms: Secondary | ICD-10-CM | POA: Diagnosis not present

## 2015-09-19 DIAGNOSIS — Z87442 Personal history of urinary calculi: Secondary | ICD-10-CM | POA: Diagnosis not present

## 2015-09-19 DIAGNOSIS — E559 Vitamin D deficiency, unspecified: Secondary | ICD-10-CM | POA: Diagnosis not present

## 2015-09-19 DIAGNOSIS — R310 Gross hematuria: Secondary | ICD-10-CM | POA: Diagnosis not present

## 2015-09-19 DIAGNOSIS — Z136 Encounter for screening for cardiovascular disorders: Secondary | ICD-10-CM | POA: Diagnosis not present

## 2015-09-19 DIAGNOSIS — R5383 Other fatigue: Secondary | ICD-10-CM

## 2015-09-19 DIAGNOSIS — R3912 Poor urinary stream: Secondary | ICD-10-CM | POA: Diagnosis not present

## 2015-09-19 DIAGNOSIS — N400 Enlarged prostate without lower urinary tract symptoms: Secondary | ICD-10-CM

## 2015-09-19 DIAGNOSIS — J449 Chronic obstructive pulmonary disease, unspecified: Secondary | ICD-10-CM | POA: Diagnosis not present

## 2015-09-19 DIAGNOSIS — Z0000 Encounter for general adult medical examination without abnormal findings: Secondary | ICD-10-CM | POA: Diagnosis not present

## 2015-09-19 DIAGNOSIS — N138 Other obstructive and reflux uropathy: Secondary | ICD-10-CM | POA: Diagnosis not present

## 2015-09-20 LAB — CBC WITH DIFFERENTIAL/PLATELET
BASOS ABS: 0 10*3/uL (ref 0.0–0.2)
Basos: 0 %
EOS (ABSOLUTE): 0.1 10*3/uL (ref 0.0–0.4)
Eos: 2 %
HEMATOCRIT: 43.1 % (ref 37.5–51.0)
HEMOGLOBIN: 14.1 g/dL (ref 12.6–17.7)
Immature Grans (Abs): 0 10*3/uL (ref 0.0–0.1)
Immature Granulocytes: 0 %
LYMPHS ABS: 1.8 10*3/uL (ref 0.7–3.1)
Lymphs: 34 %
MCH: 30.3 pg (ref 26.6–33.0)
MCHC: 32.7 g/dL (ref 31.5–35.7)
MCV: 93 fL (ref 79–97)
MONOS ABS: 0.6 10*3/uL (ref 0.1–0.9)
Monocytes: 10 %
NEUTROS ABS: 2.9 10*3/uL (ref 1.4–7.0)
Neutrophils: 54 %
Platelets: 266 10*3/uL (ref 150–379)
RBC: 4.65 x10E6/uL (ref 4.14–5.80)
RDW: 13.6 % (ref 12.3–15.4)
WBC: 5.4 10*3/uL (ref 3.4–10.8)

## 2015-09-20 LAB — NMR, LIPOPROFILE
CHOLESTEROL: 133 mg/dL (ref 100–199)
HDL CHOLESTEROL BY NMR: 42 mg/dL (ref >39)
HDL Particle Number: 26.2 umol/L — ABNORMAL LOW (ref >=30.5)
LDL PARTICLE NUMBER: 858 nmol/L (ref <1000)
LDL Size: 20.9 nm (ref >20.5)
LDL-C: 76 mg/dL (ref 0–99)
LP-IR SCORE: 43 (ref <=45)
Small LDL Particle Number: 440 nmol/L (ref <=527)
Triglycerides by NMR: 75 mg/dL (ref 0–149)

## 2015-09-20 LAB — BMP8+EGFR
BUN / CREAT RATIO: 14 (ref 10–24)
BUN: 21 mg/dL (ref 8–27)
CHLORIDE: 107 mmol/L — AB (ref 96–106)
CO2: 19 mmol/L (ref 18–29)
Calcium: 9.1 mg/dL (ref 8.6–10.2)
Creatinine, Ser: 1.47 mg/dL — ABNORMAL HIGH (ref 0.76–1.27)
GFR calc non Af Amer: 42 mL/min/{1.73_m2} — ABNORMAL LOW (ref >59)
GFR, EST AFRICAN AMERICAN: 49 mL/min/{1.73_m2} — AB (ref >59)
GLUCOSE: 99 mg/dL (ref 65–99)
POTASSIUM: 4.1 mmol/L (ref 3.5–5.2)
Sodium: 144 mmol/L (ref 134–144)

## 2015-09-20 LAB — HEPATIC FUNCTION PANEL
ALK PHOS: 83 IU/L (ref 39–117)
ALT: 9 IU/L (ref 0–44)
AST: 14 IU/L (ref 0–40)
Albumin: 3.6 g/dL (ref 3.5–4.7)
Bilirubin Total: 0.3 mg/dL (ref 0.0–1.2)
Bilirubin, Direct: 0.13 mg/dL (ref 0.00–0.40)
TOTAL PROTEIN: 5.9 g/dL — AB (ref 6.0–8.5)

## 2015-09-20 LAB — THYROID PANEL WITH TSH
FREE THYROXINE INDEX: 2.6 (ref 1.2–4.9)
T3 UPTAKE RATIO: 30 % (ref 24–39)
T4 TOTAL: 8.6 ug/dL (ref 4.5–12.0)
TSH: 3.14 u[IU]/mL (ref 0.450–4.500)

## 2015-09-20 LAB — VITAMIN D 25 HYDROXY (VIT D DEFICIENCY, FRACTURES): VIT D 25 HYDROXY: 45.1 ng/mL (ref 30.0–100.0)

## 2015-09-21 DIAGNOSIS — C44311 Basal cell carcinoma of skin of nose: Secondary | ICD-10-CM | POA: Diagnosis not present

## 2015-09-21 DIAGNOSIS — H9113 Presbycusis, bilateral: Secondary | ICD-10-CM | POA: Diagnosis not present

## 2015-09-21 DIAGNOSIS — D485 Neoplasm of uncertain behavior of skin: Secondary | ICD-10-CM | POA: Diagnosis not present

## 2015-09-21 DIAGNOSIS — H903 Sensorineural hearing loss, bilateral: Secondary | ICD-10-CM | POA: Diagnosis not present

## 2015-09-21 DIAGNOSIS — J341 Cyst and mucocele of nose and nasal sinus: Secondary | ICD-10-CM | POA: Diagnosis not present

## 2015-09-21 DIAGNOSIS — R490 Dysphonia: Secondary | ICD-10-CM | POA: Diagnosis not present

## 2015-09-21 DIAGNOSIS — R51 Headache: Secondary | ICD-10-CM | POA: Diagnosis not present

## 2015-09-21 DIAGNOSIS — R197 Diarrhea, unspecified: Secondary | ICD-10-CM | POA: Diagnosis not present

## 2015-09-21 DIAGNOSIS — J342 Deviated nasal septum: Secondary | ICD-10-CM | POA: Diagnosis not present

## 2015-09-21 DIAGNOSIS — K219 Gastro-esophageal reflux disease without esophagitis: Secondary | ICD-10-CM | POA: Diagnosis not present

## 2015-09-23 DIAGNOSIS — K219 Gastro-esophageal reflux disease without esophagitis: Secondary | ICD-10-CM | POA: Diagnosis not present

## 2015-09-23 DIAGNOSIS — R197 Diarrhea, unspecified: Secondary | ICD-10-CM | POA: Diagnosis not present

## 2015-09-27 DIAGNOSIS — B9681 Helicobacter pylori [H. pylori] as the cause of diseases classified elsewhere: Secondary | ICD-10-CM | POA: Diagnosis not present

## 2015-09-27 DIAGNOSIS — R109 Unspecified abdominal pain: Secondary | ICD-10-CM | POA: Diagnosis not present

## 2015-09-27 DIAGNOSIS — K9289 Other specified diseases of the digestive system: Secondary | ICD-10-CM | POA: Diagnosis not present

## 2015-09-27 DIAGNOSIS — R197 Diarrhea, unspecified: Secondary | ICD-10-CM | POA: Diagnosis not present

## 2015-10-04 DIAGNOSIS — R197 Diarrhea, unspecified: Secondary | ICD-10-CM | POA: Diagnosis not present

## 2015-10-04 DIAGNOSIS — R51 Headache: Secondary | ICD-10-CM | POA: Diagnosis not present

## 2015-10-04 DIAGNOSIS — K219 Gastro-esophageal reflux disease without esophagitis: Secondary | ICD-10-CM | POA: Diagnosis not present

## 2015-10-04 DIAGNOSIS — Z85818 Personal history of malignant neoplasm of other sites of lip, oral cavity, and pharynx: Secondary | ICD-10-CM | POA: Diagnosis not present

## 2015-10-06 DIAGNOSIS — K2970 Gastritis, unspecified, without bleeding: Secondary | ICD-10-CM | POA: Diagnosis not present

## 2015-10-06 DIAGNOSIS — K2940 Chronic atrophic gastritis without bleeding: Secondary | ICD-10-CM | POA: Diagnosis not present

## 2015-10-06 DIAGNOSIS — K219 Gastro-esophageal reflux disease without esophagitis: Secondary | ICD-10-CM | POA: Diagnosis not present

## 2015-10-06 DIAGNOSIS — K210 Gastro-esophageal reflux disease with esophagitis: Secondary | ICD-10-CM | POA: Diagnosis not present

## 2015-10-06 DIAGNOSIS — K2980 Duodenitis without bleeding: Secondary | ICD-10-CM | POA: Diagnosis not present

## 2015-10-06 DIAGNOSIS — R109 Unspecified abdominal pain: Secondary | ICD-10-CM | POA: Diagnosis not present

## 2015-10-31 ENCOUNTER — Encounter: Payer: Self-pay | Admitting: *Deleted

## 2015-11-02 DIAGNOSIS — Z961 Presence of intraocular lens: Secondary | ICD-10-CM | POA: Diagnosis not present

## 2015-11-02 DIAGNOSIS — H401122 Primary open-angle glaucoma, left eye, moderate stage: Secondary | ICD-10-CM | POA: Diagnosis not present

## 2015-11-02 DIAGNOSIS — H04123 Dry eye syndrome of bilateral lacrimal glands: Secondary | ICD-10-CM | POA: Diagnosis not present

## 2015-11-02 DIAGNOSIS — H40051 Ocular hypertension, right eye: Secondary | ICD-10-CM | POA: Diagnosis not present

## 2015-11-04 ENCOUNTER — Encounter: Payer: Self-pay | Admitting: Pulmonary Disease

## 2015-11-04 ENCOUNTER — Other Ambulatory Visit: Payer: Medicare Other

## 2015-11-04 ENCOUNTER — Ambulatory Visit (INDEPENDENT_AMBULATORY_CARE_PROVIDER_SITE_OTHER): Payer: Medicare Other | Admitting: Pulmonary Disease

## 2015-11-04 VITALS — BP 132/64 | HR 73 | Ht 70.5 in | Wt 168.2 lb

## 2015-11-04 DIAGNOSIS — J479 Bronchiectasis, uncomplicated: Secondary | ICD-10-CM | POA: Diagnosis not present

## 2015-11-04 DIAGNOSIS — R05 Cough: Secondary | ICD-10-CM

## 2015-11-04 DIAGNOSIS — J42 Unspecified chronic bronchitis: Secondary | ICD-10-CM

## 2015-11-04 DIAGNOSIS — K219 Gastro-esophageal reflux disease without esophagitis: Secondary | ICD-10-CM

## 2015-11-04 DIAGNOSIS — I2510 Atherosclerotic heart disease of native coronary artery without angina pectoris: Secondary | ICD-10-CM | POA: Diagnosis not present

## 2015-11-04 DIAGNOSIS — R059 Cough, unspecified: Secondary | ICD-10-CM

## 2015-11-04 NOTE — Progress Notes (Signed)
Subjective:    Patient ID: Charles Golden, male    DOB: 01-07-1926, 82 y.o.   MRN: [id number]  C.C.:  Follow-up for Chronic Bronchitis/Mild Bronchiectasis & GERD.  HPI Chronic Bronchitis/Mild Bronchiectasis:  Patient started on flutter valve at last appointment. He continues to have a productive cough with yellow phlegm. Denies any hemoptysis. He reports he is using his flutter valve at least once daily. No significant wheezing. Has been compliant with Symbicort 160/4.5. Nothing seems to help his mucus production.   GERD:  Restarted Zantac 150mg  qhs at last appointment. Reports he thinks it caused him to have nausea. Was placed on Prilosec but again caused nausea. He is still having occasional heartburn once weekly. Denies any morning brash water taste. He has stopped eating close to bedtime.   Review of Systems He reports chronic, frequent headaches. Denies any fever or chills. Does sweat frequently. Reports he has had diarrhea that has improved recently. Denies any chest pain, pressure, or tightness.   Allergies  Allergen Reactions  . Demerol [Meperidine] Other (See Comments)    Pt. States "he woke up during a colonoscopy"  . Meperidine Hcl Other (See Comments)    Unknown     Current Outpatient Prescriptions on File Prior to Visit  Medication Sig Dispense Refill  . azelastine (ASTELIN) 0.1 % nasal spray PLACE 1 SPRAY INTO THE NOSE 1 DAY OR 1 DOSE. USE IN EACH NOSTRIL AS DIRECTED 30 mL 11  . budesonide-formoterol (SYMBICORT) 160-4.5 MCG/ACT inhaler Inhale 2 puffs into the lungs 2 (two) times daily. 1 Inhaler 3  . Cholecalciferol (VITAMIN D3) 2000 UNITS TABS Take 1 tablet by mouth daily.    . Cyanocobalamin (VITAMIN B-12) 5000 MCG SUBL Place 1 tablet under the tongue daily.    Marland Kitchen. doxazosin (CARDURA) 8 MG tablet Take 0.5-1 tablets (4-8 mg total) by mouth daily. 30 tablet 11  . fluticasone (FLONASE) 50 MCG/ACT nasal spray 1 spray in each nostril qd 16 g 11  . guaiFENesin (MUCINEX) 600 MG  12 hr tablet Take 600 mg by mouth as needed. Reported on 05/25/2015    . latanoprost (XALATAN) 0.005 % ophthalmic solution Place 1 drop into the left eye at bedtime.     . Misc. Devices (ACAPELLA) MISC Use as directed 1 each 0  . Probiotic Product (ALIGN) 4 MG CAPS Take 1 tablet by mouth daily.    Marland Kitchen. Spacer/Aero-Holding Chambers (AEROCHAMBER Z-STAT PLUS) inhaler Use as instructed 1 each 0   No current facility-administered medications on file prior to visit.    Past Medical History  Diagnosis Date  . IBS (irritable bowel syndrome)   . Enlarged prostate   . Sinus congestion   . Sciatic pain   . COPD (chronic obstructive pulmonary disease) (HCC)   . Glucagonoma   . HOH (hard of hearing)   . Hernia, incisional     at present  . Bronchitis, chronic (HCC)   . GERD (gastroesophageal reflux disease)     Past Surgical History  Procedure Laterality Date  . Hemorrhoid surgery    . Laparotomy N/A 09/04/2012    Procedure: EXPLORATORY LAPAROTOMY;  Surgeon: Fabio BeringBrent C Ziegler, MD;  Location: [facility];  Service: General;  Laterality: N/A;  . Bowel resection N/A 09/04/2012    Procedure: SMALL BOWEL RESECTION;  Surgeon: Fabio BeringBrent C Ziegler, MD;  Location: [facility];  Service: General;  Laterality: N/A;  Anastimosis  . Hernia repair Right 70's  . Inguinal hernia repair Left 09/14/2013    Procedure: LEFT  INGUINAL HERNIORRHAPHY;  Surgeon: Jamesetta So, MD;  Location: [facility];  Service: General;  Laterality: Left;  . Insertion of mesh Left 09/14/2013    Procedure: INSERTION OF MESH;  Surgeon: Jamesetta So, MD;  Location: [facility];  Service: General;  Laterality: Left;  . Tonsillectomy    . Knee arthroscopy Left 07/21/2014    Procedure: LEFT KNEE ARTHROSCOPY WITH MEDIAL MENISCAL DEBRIDEMENT ;  Surgeon: Gearlean Alf, MD;  Location: [facility];  Service: Orthopedics;  Laterality: Left;    Family History  Problem Relation Age of Onset  . Asthma      cousin  . Heart disease Mother     Valve replacement and  pacemaker  . Colon cancer Father     Social History   Social History  . Marital Status: Married    Spouse Name: [name]  . Number of Children: 3  . Years of Education: 12+   Occupational History  . Retired     Immunologist   Social History Main Topics  . Smoking status: Former Smoker -- 1.00 packs/day for 30 years    Types: Cigarettes    Quit date: 05/28/2006  . Smokeless tobacco: Never Used  . Alcohol Use: 0.0 oz/week    0 Standard drinks or equivalent per week     Comment: Rare beer or wine.  . Drug Use: No  . Sexual Activity: Yes    Birth Control/ Protection: None   Other Topics Concern  . None   Social History Narrative   Patient lives at home with [name].    Patient has 3 children.    Patient has his BS   Patient is retired.       [facility]:   He is still married. 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 He is from [place].             Objective:   Physical Exam BP 132/64 mmHg  Pulse 73  Ht 5' 10.5" (1.791 m)  Wt 168 lb 3.2 oz (76.295 kg)  BMI 23.79 kg/m2  SpO2 96% General:  Elderly male. Awake. Alert. No distress. Wife with him today. Integument:  Warm & dry. No rash on exposed skin. No bruising. HEENT:  Moist mucus membranes. No oral ulcers. No nasal turbinate swelling. Cardiovascular:  Regular rate & rhythm. No edema.   Pulmonary:  Minimal sporadic wheezing. Normal work of breathing on room air. Speaking in complete sentences. Abdomen: Soft. Normal bowel sounds. Nontender.  PFT 07/13/15: FVC 3.63 L (96%) FEV1 2.40 L (92%) FEV1/FVC 0.66 FEF 25-75 1.17 L (71%) positive bronchodilator response TLC 8.07 L (112%) RV 143% ERV 106% DLCO corrected 75%  06/22/13: FVC 3.74 L (89%) FEV1 2.39 L (77%) FEV1/FVC 0.64 FEF 25-75 1.17 L (46%)  6MWT 07/13/15:  Walked 360 meters / Baseline Sat 97% on RA / Nadir Sat 97% on RA   IMAGING CT CHEST W/O 04/19/15 (previously  reviewed by me): Mild centrilobular emphysematous changes. Subcentimeter peripheral left upper lobe nodules noted. Small hiatal hernia. No pericardial effusion. No pleural effusion or thickening. Calcified nodules within spleen noted.  CT CHEST W/O 05/04/03 (previously reviewed by me): Mild cylindrical bronchiectasis with linear opacity particularly in the right lower lobe suggestive of scar formation. Some hilar node calcification especially on the left. 2 separate subcentimeter nodules within the inferior segment of the lingula one of which is at least partially calcified. No pleural  effusion or thickening appreciated. No pericardial effusion.  MICROBIOLOGY Sputum (04/19/15): Oral Flora/AFB negative/Fungal negative  LABS 04/11/15 Alpha-1 antitrypsin: MM (153) IgM: 31 IgA: 195 IgG: 822    Assessment & Plan:  82 year old male with a long history with chronic bronchitis/mild bronchiectasis. Patient's ongoing cough and fatigue are likely secondary to his underlying bronchiectasis. Reflux seems to be better controlled with his lifestyle modifications. He is intolerant of gastric acid suppression medications. It is possible that he has an atypical infection ongoing given his night sweats; however, I am reluctant to start him on any antibiotic empirically with his long-standing history of diarrhea. With continued dyspnea on exertion it's difficult to determine whether or not this is due to his underlying lung disease or possibly simply age-related physical deconditioning. I instructed the patient to contact my office if he had any new breathing problems before his next appointment or questions.  1. Chronic Bronchitis/Mild Bronchiectasis:  Continuing flutter valve and encouraged its use twice daily. Holiday from Symbicort. Plan to repeat spirometry with bronchodilator challenge at next appointment. Checking sputum culture for AFB, fungus, and bacteria. Also checking 6 to walk test on room air at next  appointment. 2. GERD: Minimal symptoms. No new medications at this time. 3. Follow-up: Patient to return to clinic in 1-2 months or sooner if needed.  Sonia Baller Ashok Cordia, M.D. [facility] Pager:  [phone] After 3pm or if no response, call [phone] 3:30 PM 11/04/2015

## 2015-11-04 NOTE — Patient Instructions (Signed)
   Try to remember to use your Flutter Valve twice daily.  Try going off your Symbicort for a period to see if this helps your cough/mucus production. If your breathing or coughing get worse go back on the medication.  I will see you back in 1-2 months or sooner if needed.  TESTS ORDERED: 1. Sputum Culture for AFB, Fungus, & Bacteria.  2. Spirometry with bronchodilator challenge at next appointment.  3. 6MWT on room air at next appointment \

## 2015-11-09 ENCOUNTER — Other Ambulatory Visit: Payer: Medicare Other

## 2015-11-09 DIAGNOSIS — K580 Irritable bowel syndrome with diarrhea: Secondary | ICD-10-CM | POA: Diagnosis not present

## 2015-11-09 DIAGNOSIS — K210 Gastro-esophageal reflux disease with esophagitis: Secondary | ICD-10-CM | POA: Diagnosis not present

## 2015-11-09 DIAGNOSIS — R05 Cough: Secondary | ICD-10-CM | POA: Diagnosis not present

## 2015-11-09 DIAGNOSIS — J479 Bronchiectasis, uncomplicated: Secondary | ICD-10-CM

## 2015-11-09 DIAGNOSIS — R059 Cough, unspecified: Secondary | ICD-10-CM

## 2015-11-11 LAB — SPECIMEN STATUS REPORT

## 2015-11-11 LAB — BACTERIA CULTURE IDENTIFICATION, ANAEROBIC

## 2015-11-12 LAB — RESPIRATORY CULTURE OR RESPIRATORY AND SPUTUM CULTURE: ORGANISM ID, BACTERIA: NORMAL

## 2015-11-16 ENCOUNTER — Ambulatory Visit (INDEPENDENT_AMBULATORY_CARE_PROVIDER_SITE_OTHER): Payer: Medicare Other | Admitting: Neurology

## 2015-11-16 ENCOUNTER — Encounter: Payer: Self-pay | Admitting: Neurology

## 2015-11-16 VITALS — BP 118/64 | HR 80 | Ht 70.0 in | Wt 166.5 lb

## 2015-11-16 DIAGNOSIS — I2510 Atherosclerotic heart disease of native coronary artery without angina pectoris: Secondary | ICD-10-CM | POA: Diagnosis not present

## 2015-11-16 DIAGNOSIS — G441 Vascular headache, not elsewhere classified: Secondary | ICD-10-CM

## 2015-11-16 MED ORDER — ZONISAMIDE 25 MG PO CAPS: ORAL_CAPSULE | ORAL | Status: DC

## 2015-11-16 NOTE — Patient Instructions (Signed)

## 2015-11-16 NOTE — Progress Notes (Signed)
Reason for visit: Headache  Referring physician: Dr. Oletta Darter is a 82 y.o. male  History of present illness:  Charles Golden is an 82 year old right-handed white male with a history of headaches dating back to about 2012. He indicates that earlier in his life he never had headaches, and he does not have a family history of headaches. His daughter, however does have significant migraine headache. The patient indicates that his headaches have been mainly bifrontal, on the top of head, and around the eyes. The headaches are associated at times with nausea, no vomiting. He denies photophobia or phonophobia with the headache. He was seen in 2014 by Dr. Janann Colonel and MRI of the brain was done showing no significant intracranial abnormalities with exception of some atrophy of the brain, but the patient did have fluid levels in the maxillary sinuses. The patient was seen by Dr. Lucia Gaskins from ENT, and a CT scan of the sinuses did not show significant evidence of sinus disease. Eventually, the patient went to see Dr. Melton Alar and he was treated with gabapentin working up to 600 mg 3 times daily. The patient is not clear whether or not this helps his headaches. The patient has discovered that caffeinated products such as coffee or Excedrin Migraine seem to help the headache significantly. Over time, the headaches have continued be frequent, he will have on average 5 out of 7 days a week with headache. He will often times wake up with a headache, or he may have headache later in the afternoon. The headache may go away within 45 minutes after he takes an Excedrin Migraine. The patient however has reflux problems and he cannot take nonsteroidal anti-inflammatory medications regularly. He also has a history of irritable bowel syndrome. He denies any neck stiffness, dizziness, or any confusion with the headache. He has had a sedimentation rate done previously that was low, level of 5. He is sent to this office for  an evaluation after he saw Dr. Polly Cobia regarding his sinus issues. Once again, he was not felt to have significant sinus disease. He denies any new medications that were added or taken away at the time of onset of the headache.  Past Medical History  Diagnosis Date  . IBS (irritable bowel syndrome)   . Enlarged prostate   . Sinus congestion   . Sciatic pain   . COPD (chronic obstructive pulmonary disease) ([name])   . Glucagonoma   . HOH (hard of hearing)   . Hernia, incisional     at present  . Bronchitis, chronic ([place])   . GERD (gastroesophageal reflux disease)     Past Surgical History  Procedure Laterality Date  . Hemorrhoid surgery    . Laparotomy N/A 09/04/2012    Procedure: EXPLORATORY LAPAROTOMY;  Surgeon: Donato Heinz, MD;  Location: [facility];  Service: General;  Laterality: N/A;  . Bowel resection N/A 09/04/2012    Procedure: SMALL BOWEL RESECTION;  Surgeon: Donato Heinz, MD;  Location: [facility];  Service: General;  Laterality: N/A;  Anastimosis  . Hernia repair Right 70's  . Inguinal hernia repair Left 09/14/2013    Procedure: LEFT INGUINAL HERNIORRHAPHY;  Surgeon: Jamesetta So, MD;  Location: [facility];  Service: General;  Laterality: Left;  . Insertion of mesh Left 09/14/2013    Procedure: INSERTION OF MESH;  Surgeon: Jamesetta So, MD;  Location: [facility];  Service: General;  Laterality: Left;  . Tonsillectomy    . Knee arthroscopy Left  07/21/2014    Procedure: LEFT KNEE ARTHROSCOPY WITH MEDIAL MENISCAL DEBRIDEMENT ;  Surgeon: Gearlean Alf, MD;  Location: [facility];  Service: Orthopedics;  Laterality: Left;    Family History  Problem Relation Age of Onset  . Asthma      cousin  . Heart disease Mother     Valve replacement and pacemaker  . Colon cancer Father     Social history:  reports that he quit smoking about 9 years ago. His smoking use included Cigarettes. He has a 30 pack-year smoking history. He has never used smokeless tobacco. He reports that he drinks  alcohol. He reports that he does not use illicit drugs.  Medications:  Prior to Admission medications   Medication Sig Start Date End Date Taking? Authorizing Provider  azelastine (ASTELIN) 0.1 % nasal spray PLACE 1 SPRAY INTO THE NOSE 1 DAY OR 1 DOSE. USE IN EACH NOSTRIL AS DIRECTED 05/03/15   Chipper Herb, MD  budesonide-formoterol [facility]) 160-4.5 MCG/ACT inhaler Inhale 2 puffs into the lungs 2 (two) times daily. 06/09/15   Chipper Herb, MD  Cholecalciferol (VITAMIN D3) 2000 UNITS TABS Take 1 tablet by mouth daily.    Historical Provider, MD  Cyanocobalamin (VITAMIN B-12) 5000 MCG SUBL Place 1 tablet under the tongue daily.    Historical Provider, MD  doxazosin (CARDURA) 8 MG tablet Take 0.5-1 tablets (4-8 mg total) by mouth daily. 05/03/15   Chipper Herb, MD  fluticasone [facility]) 50 MCG/ACT nasal spray 1 spray in each nostril qd 05/03/15   Chipper Herb, MD  guaiFENesin (MUCINEX) 600 MG 12 hr tablet Take 600 mg by mouth as needed. Reported on 05/25/2015    Historical Provider, MD  latanoprost (XALATAN) 0.005 % ophthalmic solution Place 1 drop into the left eye at bedtime.  08/05/12   Historical Provider, MD  Misc. Devices (ACAPELLA) MISC Use as directed 07/13/15   Javier Glazier, MD  Probiotic Product (ALIGN) 4 MG CAPS Take 1 tablet by mouth daily.    Historical Provider, MD  Spacer/Aero-Holding Chambers (AEROCHAMBER Z-STAT PLUS) inhaler Use as instructed 07/13/15   Javier Glazier, MD      Allergies  Allergen Reactions  . Demerol [Meperidine] Other (See Comments)    Pt. States "he woke up during a colonoscopy"  . Meperidine Hcl Other (See Comments)    Unknown     ROS:  Out of a complete 14 system review of symptoms, the patient complains only of the following symptoms, and all other reviewed systems are negative.  Fatigue Shortness of breath Urination problems Headache  Blood pressure 118/64, pulse 80, height 5\' 10"  (1.778 m), weight 166 lb 8 oz (75.524  kg).  Physical Exam  General: The patient is alert and cooperative at the time of the examination.  Eyes: Pupils are equal, round, and reactive to light. Discs are flat bilaterally.  Neck: The neck is supple, no carotid bruits are noted.  Respiratory: The respiratory examination is notable for bilateral posterior wheezes.  Cardiovascular: The cardiovascular examination reveals a regular rate and rhythm, no obvious murmurs or rubs are noted.  Neuromuscular: The patient lacks about 20 of lateral rotation of the cervical spine bilaterally. No crepitus is noted in the temporomandibular joints.  Skin: Extremities are without significant edema.  Neurologic Exam  Mental status: The patient is alert and oriented x 3 at the time of the examination. The patient has apparent normal recent and remote memory, with an apparently normal attention span and  concentration ability.  Cranial nerves: Facial symmetry is present. There is good sensation of the face to pinprick and soft touch bilaterally. The strength of the facial muscles and the muscles to head turning and shoulder shrug are normal bilaterally. Speech is well enunciated, no aphasia or dysarthria is noted. Extraocular movements are full. Visual fields are full. The tongue is midline, and the patient has symmetric elevation of the soft palate. No obvious hearing deficits are noted.  Motor: The motor testing reveals 5 over 5 strength of all 4 extremities. Good symmetric motor tone is noted throughout.  Sensory: Sensory testing is intact to pinprick, soft touch, vibration sensation, and position sense on all 4 extremities, with exception of some mild depression of vibration sensation in both feet. No evidence of extinction is noted.  Coordination: Cerebellar testing reveals good finger-nose-finger and heel-to-shin bilaterally.  Gait and station: Gait is normal. Tandem gait is unsteady. Romberg is negative. No drift is seen.  Reflexes: Deep  tendon reflexes are symmetric, but are slightly depressed bilaterally. Toes are downgoing bilaterally.   MRI brain 05/15/13:  IMPRESSION: Abnormal MRI brain showing moderate degree of generalized cerebral atrophy. Chronic maxillary sinusitis changes are noted.  * MRI scan images were reviewed online. I agree with the written report.    Assessment/Plan:  1. Bifrontal headache  The patient has persistent headaches over number of years that are quite frequent and can be relatively severe. The patient has no history of migraine at a younger age, but his daughter does have migraine. The patient has caffeine responsive headaches that are suggestive of migraine. The patient will be given a trial on Zonegran taking 25 mg capsule at night for 2 weeks, then take 50 mg at night. He will follow-up in 3 months, he will contact our office if he requires a dose adjustments of his medication.  Jill Alexanders MD 11/16/2015 11:08 AM  [facility] [address] [place], [place] [address]  Phone [phone] Fax [phone]

## 2015-12-08 ENCOUNTER — Encounter: Payer: Self-pay | Admitting: Neurology

## 2015-12-09 ENCOUNTER — Other Ambulatory Visit: Payer: Self-pay | Admitting: Neurology

## 2015-12-09 MED ORDER — ZONISAMIDE 50 MG PO CAPS: ORAL_CAPSULE | ORAL | Status: DC

## 2015-12-19 ENCOUNTER — Ambulatory Visit: Payer: Medicare Other | Admitting: Pulmonary Disease

## 2015-12-20 ENCOUNTER — Ambulatory Visit (INDEPENDENT_AMBULATORY_CARE_PROVIDER_SITE_OTHER): Payer: Medicare Other | Admitting: Pulmonary Disease

## 2015-12-20 DIAGNOSIS — J479 Bronchiectasis, uncomplicated: Secondary | ICD-10-CM | POA: Diagnosis not present

## 2015-12-20 DIAGNOSIS — J42 Unspecified chronic bronchitis: Secondary | ICD-10-CM

## 2015-12-20 LAB — PULMONARY FUNCTION TEST
FEF 25-75 POST: 1.58 L/s
FEF 25-75 PRE: 1.09 L/s
FEF2575-%CHANGE-POST: 44 %
FEF2575-%PRED-POST: 98 %
FEF2575-%PRED-PRE: 68 %
FEV1-%Change-Post: 10 %
FEV1-%PRED-POST: 94 %
FEV1-%Pred-Pre: 85 %
FEV1-POST: 2.43 L
FEV1-Pre: 2.21 L
FEV1FVC-%CHANGE-POST: 7 %
FEV1FVC-%PRED-PRE: 92 %
FEV6-%CHANGE-POST: 3 %
FEV6-%PRED-POST: 98 %
FEV6-%Pred-Pre: 95 %
FEV6-Post: 3.42 L
FEV6-Pre: 3.31 L
FEV6FVC-%CHANGE-POST: 0 %
FEV6FVC-%PRED-PRE: 104 %
FEV6FVC-%Pred-Post: 104 %
FVC-%CHANGE-POST: 2 %
FVC-%Pred-Post: 93 %
FVC-%Pred-Pre: 91 %
FVC-Post: 3.5 L
FVC-Pre: 3.41 L
POST FEV1/FVC RATIO: 69 %
PRE FEV1/FVC RATIO: 65 %
Post FEV6/FVC ratio: 98 %
Pre FEV6/FVC Ratio: 97 %

## 2015-12-20 NOTE — Progress Notes (Signed)
PFT 12/20/15: FVC 3.41 L (91%) FEV1 2.21 L (85%) FEV1/FVC 0.65 FEF 25-75 1.09 L (68%) no bronchodilator response  MICROBIOLOGY Sputum Ctx (11/09/15):  Oral Flora / Fungus negative / AFB pending

## 2015-12-21 ENCOUNTER — Ambulatory Visit (INDEPENDENT_AMBULATORY_CARE_PROVIDER_SITE_OTHER): Payer: Medicare Other | Admitting: Pulmonary Disease

## 2015-12-21 ENCOUNTER — Encounter: Payer: Self-pay | Admitting: Pulmonary Disease

## 2015-12-21 VITALS — BP 106/62 | HR 68 | Ht 70.5 in | Wt 166.8 lb

## 2015-12-21 DIAGNOSIS — R3912 Poor urinary stream: Secondary | ICD-10-CM | POA: Diagnosis not present

## 2015-12-21 DIAGNOSIS — I2510 Atherosclerotic heart disease of native coronary artery without angina pectoris: Secondary | ICD-10-CM

## 2015-12-21 DIAGNOSIS — N401 Enlarged prostate with lower urinary tract symptoms: Secondary | ICD-10-CM | POA: Diagnosis not present

## 2015-12-21 DIAGNOSIS — J479 Bronchiectasis, uncomplicated: Secondary | ICD-10-CM

## 2015-12-21 DIAGNOSIS — K219 Gastro-esophageal reflux disease without esophagitis: Secondary | ICD-10-CM | POA: Diagnosis not present

## 2015-12-21 DIAGNOSIS — R0600 Dyspnea, unspecified: Secondary | ICD-10-CM

## 2015-12-21 DIAGNOSIS — J42 Unspecified chronic bronchitis: Secondary | ICD-10-CM | POA: Diagnosis not present

## 2015-12-21 DIAGNOSIS — R310 Gross hematuria: Secondary | ICD-10-CM | POA: Diagnosis not present

## 2015-12-21 NOTE — Patient Instructions (Signed)
   Call or e-mail me if you have any new breathing problems before your next appointment.  You should let me know if you are producing more mucus or notice that you have having any bloody mucus.  You should also notify me if you start to have any fever, chills, sweats, chest pressure or chest tightness with your cough.  Try to use your flutter valve/acapella device at least once daily.  Avoid the heat & humidity of the mid-day.  I will see you back in 6 months or sooner if needed.

## 2015-12-21 NOTE — Progress Notes (Signed)
Subjective:    Patient ID: Charles Golden, male    DOB: [birth year], 82 y.o.   MRN: [id number]  C.C.:  Follow-up for Chronic Bronchitis/Mild Bronchiectasis & GERD.  HPI Chronic Bronchitis/Mild Bronchiectasis:  Symbicort holiday starting at last appointment. Reports he stopped it 3-4 weeks ago. Patient encouraged to use his flutter valve twice daily at last appointment. Sputum culture thus far has been negative but AFB is pending. He reports he continues to produce about 2 cups of yellow or green mucus daily. Denies any hemoptysis. Still has dyspnea on exertion doing his yard work.   GERD:  He reports no reflux dyspepsia or morning brash water taste. Not currently on any medication.   Review of Systems He reports he continues to have chronic headaches. He reports lack of "energy". Denies any fever or chills. No chest pain, pressure, or tightness.   Allergies  Allergen Reactions  . Demerol [Meperidine] Other (See Comments)    Pt. States "he woke up during a colonoscopy"  . Meperidine Hcl Other (See Comments)    Unknown     Current Outpatient Prescriptions on File Prior to Visit  Medication Sig Dispense Refill  . azelastine (ASTELIN) 0.1 % nasal spray PLACE 1 SPRAY INTO THE NOSE 1 DAY OR 1 DOSE. USE IN EACH NOSTRIL AS DIRECTED 30 mL 11  . Cholecalciferol (VITAMIN D3) 2000 UNITS TABS Take 1 tablet by mouth daily.    . Cyanocobalamin (VITAMIN B-12) 5000 MCG SUBL Place 1 tablet under the tongue daily.    Marland Kitchen doxazosin (CARDURA) 8 MG tablet Take 0.5-1 tablets (4-8 mg total) by mouth daily. 30 tablet 11  . fluticasone (FLONASE) 50 MCG/ACT nasal spray 1 spray in each nostril qd 16 g 11  . guaiFENesin (MUCINEX) 600 MG 12 hr tablet Take 600 mg by mouth as needed. Reported on 05/25/2015    . latanoprost (XALATAN) 0.005 % ophthalmic solution Place 1 drop into the left eye at bedtime.     . Misc. Devices (ACAPELLA) MISC Use as directed 1 each 0  . Probiotic Product (ALIGN) 4 MG CAPS Take 1 tablet by  mouth daily.    Marland Kitchen zonisamide (ZONEGRAN) 50 MG capsule One capsule twice daily for 2 weeks, then take 1 in the morning and 2 in the evening 90 capsule 2   No current facility-administered medications on file prior to visit.     Past Medical History:  Diagnosis Date  . Bronchitis, chronic ([place])   . COPD (chronic obstructive pulmonary disease) ([place])   . Enlarged prostate   . GERD (gastroesophageal reflux disease)   . Glucagonoma   . Hernia, incisional    at present  . HOH (hard of hearing)   . IBS (irritable bowel syndrome)   . Sciatic pain   . Sinus congestion     Past Surgical History:  Procedure Laterality Date  . BOWEL RESECTION N/A 09/04/2012   Procedure: SMALL BOWEL RESECTION;  Surgeon: Donato Heinz, MD;  Location: [facility];  Service: General;  Laterality: N/A;  Anastimosis  . HEMORRHOID SURGERY    . HERNIA REPAIR Right 70's  . INGUINAL HERNIA REPAIR Left 09/14/2013   Procedure: LEFT INGUINAL HERNIORRHAPHY;  Surgeon: Jamesetta So, MD;  Location: [facility];  Service: General;  Laterality: Left;  . INSERTION OF MESH Left 09/14/2013   Procedure: INSERTION OF MESH;  Surgeon: Jamesetta So, MD;  Location: [facility];  Service: General;  Laterality: Left;  . KNEE ARTHROSCOPY Left 07/21/2014   Procedure:  LEFT KNEE ARTHROSCOPY WITH MEDIAL MENISCAL DEBRIDEMENT ;  Surgeon: Gearlean Alf, MD;  Location: [facility];  Service: Orthopedics;  Laterality: Left;  . LAPAROTOMY N/A 09/04/2012   Procedure: EXPLORATORY LAPAROTOMY;  Surgeon: Donato Heinz, MD;  Location: [facility];  Service: General;  Laterality: N/A;  . TONSILLECTOMY      Family History  Problem Relation Age of Onset  . Asthma      cousin  . Heart disease Mother     Valve replacement and pacemaker  . Colon cancer Father     Social History   Social History  . Marital status: Married    Spouse name: [name]  . Number of children: 3  . Years of education: 12+   Occupational History  . Retired     Immunologist   Social  History Main Topics  . Smoking status: Former Smoker    Packs/day: 1.00    Years: 30.00    Types: Cigarettes    Quit date: 05/28/2006  . Smokeless tobacco: Never Used  . Alcohol use 0.0 oz/week     Comment: Rare beer or wine.  . Drug use: No  . Sexual activity: Yes    Birth control/ protection: None   Other Topics Concern  . None   Social History Narrative   Patient lives at home with [name].    Patient has 3 children.    Patient has his BS   Patient is retired.    Drinks about 2 cups of coffee per day.      [facility]:   He is still married. 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 He is from [place].             Objective:   Physical Exam BP 106/62 (BP Location: Left Arm, Cuff Size: Normal)   Pulse 68   Ht 5' 10.5" (1.791 m)   Wt 166 lb 12.8 oz (75.7 kg)   SpO2 96%   BMI 23.60 kg/m  General:  Elderly male. No distress. Wife with him today. Integument:  Warm & dry. No rash on exposed skin.  HEENT:  Moist mucus membranes. No oral ulcers. No scleral icterus. Cardiovascular:  Regular rate & rhythm. No edema.  Normal S1 & S2. Pulmonary:  Good aeration & clear bilaterally on auscultation. Minimal intermittent wheeze that clears with coughing. Normal work of breathing. Abdomen: Soft. Normal bowel sounds. Nontender.  PFT 12/20/15: FVC 3.41 L (91%) FEV1 2.21 L (85%) FEV1/FVC 0.65 FEF 25-75 1.09 L (68%) negative bronchodilator response 07/13/15: FVC 3.63 L (96%) FEV1 2.40 L (92%) FEV1/FVC 0.66 FEF 25-75 1.17 L (71%) positive bronchodilator response TLC 8.07 L (112%) RV 143% ERV 106% DLCO corrected 75%  06/22/13: FVC 3.74 L (89%) FEV1 2.39 L (77%) FEV1/FVC 0.64 FEF 25-75 1.17 L (46%)  6MWT 12/21/15:  Walked 360 meters / Baseline Sat 99% on RA / Nadir Sat 97% 07/13/15:  Walked 360 meters / Baseline Sat 97% on RA / Nadir Sat 97% on RA   IMAGING CT CHEST W/O 04/19/15 (previously  reviewed by me): Mild centrilobular emphysematous changes. Subcentimeter peripheral left upper lobe nodules noted. Small hiatal hernia. No pericardial effusion. No pleural effusion or thickening. Calcified nodules within spleen noted.  CT CHEST W/O 05/04/03 (previously reviewed by me): Mild cylindrical bronchiectasis with linear opacity particularly in the right lower lobe suggestive of scar formation. Some hilar node calcification especially on the left.  2 separate subcentimeter nodules within the inferior segment of the lingula one of which is at least partially calcified. No pleural effusion or thickening appreciated. No pericardial effusion.  MICROBIOLOGY Sputum Ctx (11/09/15):  Oral Flora / Fungus negative / AFB pending Sputum (04/19/15): Oral Flora/AFB negative/Fungal negative  LABS 04/11/15 Alpha-1 antitrypsin: MM (153) IgM: 31 IgA: 195 IgG: 822    Assessment & Plan:  82 year old male with a long history with chronic bronchitis/mild bronchiectasis. Patient does have chronic productive cough but no other symptoms that would suggest underlying infection. Counseled the patient on the need for avoidance of excessive heat & humidity which can worsen his breathing. His spirometry has worsened significantly since previous testing but overall still shows only mild airway obstruction and has no significant bronchodilator response at this time. I instructed the patient contact my office if he had any new breathing problems, worsening of his cough, or questions before his next appointment.  1. Chronic Bronchitis/Mild Bronchiectasis:  Patient encouraged to use flutter valve at least once daily & avoid the excessive heat and humidity of the day. Continuing on Mucinex. 2. GERD: Remains asymptomatic off medication. No melena medications at this time. 3. Health Maintenance:  Status post Pneumovax January 1999 & Prevnar July 2015. 4. Follow-up: Patient to return to clinic in 6 months or sooner if  needed.  Sonia Baller Ashok Cordia, M.D. [facility] Pager:  [phone] After 3pm or if no response, call [phone] 10:19 AM 12/21/15

## 2015-12-21 NOTE — Progress Notes (Signed)
Test reviewed.  

## 2015-12-27 LAB — FUNGUS CULTURE W SMEAR

## 2015-12-27 LAB — AFB CULTURE WITH SMEAR (NOT AT ARMC)

## 2016-01-18 ENCOUNTER — Ambulatory Visit: Payer: Medicare Other | Admitting: Family Medicine

## 2016-01-24 ENCOUNTER — Ambulatory Visit (INDEPENDENT_AMBULATORY_CARE_PROVIDER_SITE_OTHER): Payer: Medicare Other

## 2016-01-24 ENCOUNTER — Encounter: Payer: Self-pay | Admitting: Family Medicine

## 2016-01-24 ENCOUNTER — Ambulatory Visit (INDEPENDENT_AMBULATORY_CARE_PROVIDER_SITE_OTHER): Payer: Medicare Other | Admitting: Family Medicine

## 2016-01-24 VITALS — BP 107/64 | HR 88 | Temp 97.3°F | Ht 70.5 in | Wt 163.0 lb

## 2016-01-24 DIAGNOSIS — R5381 Other malaise: Secondary | ICD-10-CM

## 2016-01-24 DIAGNOSIS — I700 Atherosclerosis of aorta: Secondary | ICD-10-CM

## 2016-01-24 DIAGNOSIS — K219 Gastro-esophageal reflux disease without esophagitis: Secondary | ICD-10-CM | POA: Diagnosis not present

## 2016-01-24 DIAGNOSIS — R718 Other abnormality of red blood cells: Secondary | ICD-10-CM

## 2016-01-24 DIAGNOSIS — I2510 Atherosclerotic heart disease of native coronary artery without angina pectoris: Secondary | ICD-10-CM | POA: Diagnosis not present

## 2016-01-24 DIAGNOSIS — J449 Chronic obstructive pulmonary disease, unspecified: Secondary | ICD-10-CM

## 2016-01-24 DIAGNOSIS — R5383 Other fatigue: Secondary | ICD-10-CM

## 2016-01-24 DIAGNOSIS — E559 Vitamin D deficiency, unspecified: Secondary | ICD-10-CM | POA: Diagnosis not present

## 2016-01-24 DIAGNOSIS — N400 Enlarged prostate without lower urinary tract symptoms: Secondary | ICD-10-CM

## 2016-01-24 LAB — URINALYSIS, COMPLETE
Bilirubin, UA: NEGATIVE
GLUCOSE, UA: NEGATIVE
Nitrite, UA: NEGATIVE
PH UA: 5 (ref 5.0–7.5)
Specific Gravity, UA: 1.025 (ref 1.005–1.030)
UUROB: 0.2 mg/dL (ref 0.2–1.0)

## 2016-01-24 LAB — MICROSCOPIC EXAMINATION
BACTERIA UA: NONE SEEN
Epithelial Cells (non renal): NONE SEEN /hpf (ref 0–10)

## 2016-01-24 NOTE — Progress Notes (Signed)
Subjective:    Patient ID: Charles Golden, male    DOB: [birth year], 82 y.o.   MRN: [id number]  HPI  Pt here for follow up and management of chronic medical problems which includes GERD and COPD. He is taking medications regularly.The patient comes in today complaining of generalized fatigue and headache. He is been seeing Dr. Jannifer Franklin the neurologist. He sees Dr. Jeffie Pollock the urologist every 3 months . The patient today is biggest complaint is extreme fatigue. He is seen multiple specialists. He sees the urologist yearly because of his BPH. He seen the ear nose and throat specialist and nothing significant pain from that visit. He seen the pulmonologist and his pulmonary function tests were good distal indicating some chronic bronchitis and maybe some mild COPD. He's had a stress test from the cardiologist and that was good. He sees the gastroenterologist periodically when he has trouble with diarrhea and irritable bowel syndrome and currently that is doing well. He denies any chest pain. He does have some shortness of breath. Currently he's having no problems with his GI tract and no blood in the stool. He has urinary frequency but no burning. He still has occasional headaches but says the headaches may be better since he's been seeing the neurologist and taking Zonegran. The biggest issue is his fatigue. He did have a past history of positive for Lyme disease. This may be playing a role with his fatigue.     Patient Active Problem List   Diagnosis Date Noted  . Thoracic aortic atherosclerosis ([place]) 05/03/2015  . Upper airway cough syndrome 04/28/2015  . GERD (gastroesophageal reflux disease) 04/11/2015  . Tinea corporis 02/15/2015  . Acute medial meniscal tear 07/20/2014  . COPD GOLD I  12/08/2013  . Small bowel volvulus ([place]) 08/05/2013  . Chronic headache 08/05/2013  . Left inguinal hernia 08/05/2013  . Incisional hernia, without obstruction or gangrene 08/05/2013  . Bronchiectasis without  acute exacerbation ([place]) 06/22/2013  . Headache 05/01/2013  . Obstructive chronic bronchitis without exacerbation ([place]) 04/30/2013  . Left hip pain 04/29/2013  . BPH (benign prostatic hyperplasia) 01/05/2013  . IBS (irritable bowel syndrome) 09/03/2012  . Chronic rhinosinusitis 09/03/2012  . Lung nodules 05/04/2003   Outpatient Encounter Prescriptions as of 01/24/2016  Medication Sig  . azelastine (ASTELIN) 0.1 % nasal spray PLACE 1 SPRAY INTO THE NOSE 1 DAY OR 1 DOSE. USE IN EACH NOSTRIL AS DIRECTED  . Cholecalciferol (VITAMIN D3) 2000 UNITS TABS Take 1 tablet by mouth daily.  . Cyanocobalamin (VITAMIN B-12) 5000 MCG SUBL Place 1 tablet under the tongue daily.  Marland Kitchen doxazosin (CARDURA) 8 MG tablet Take 0.5-1 tablets (4-8 mg total) by mouth daily.  . fluticasone (FLONASE) 50 MCG/ACT nasal spray 1 spray in each nostril qd  . guaiFENesin (MUCINEX) 600 MG 12 hr tablet Take 600 mg by mouth as needed. Reported on 05/25/2015  . latanoprost (XALATAN) 0.005 % ophthalmic solution Place 1 drop into the left eye at bedtime.   . Misc. Devices (ACAPELLA) MISC Use as directed  . Probiotic Product (ALIGN) 4 MG CAPS Take 1 tablet by mouth daily.  Marland Kitchen zonisamide (ZONEGRAN) 50 MG capsule One capsule twice daily for 2 weeks, then take 1 in the morning and 2 in the evening   No facility-administered encounter medications on file as of 01/24/2016.      Review of Systems  Constitutional: Positive for fatigue (no energy).  Eyes: Negative.   Respiratory: Negative.   Cardiovascular: Negative.   Gastrointestinal: Negative.  Endocrine: Negative.   Genitourinary: Negative.   Musculoskeletal: Negative.   Skin: Negative.   Allergic/Immunologic: Negative.   Neurological: Positive for headaches (seeing Dr Jannifer Franklin).  Hematological: Negative.   Psychiatric/Behavioral: Negative.        Objective:   Physical Exam  Constitutional: He is oriented to person, place, and time. He appears well-developed and  well-nourished. No distress.  The patient is pleasant but frustrated with his ongoing fatigue and no reason for this.  HENT:  Head: Normocephalic and atraumatic.  Right Ear: External ear normal.  Left Ear: External ear normal.  Nose: Nose normal.  Mouth/Throat: Oropharynx is clear and moist. No oropharyngeal exudate.  He is wearing bilateral hearing aids  Eyes: Conjunctivae and EOM are normal. Pupils are equal, round, and reactive to light. Right eye exhibits no discharge. Left eye exhibits no discharge. No scleral icterus.  Neck: Normal range of motion. Neck supple. No thyromegaly present.  No bruits thyromegaly or anterior cervical adenopathy  Cardiovascular: Normal rate, regular rhythm, normal heart sounds and intact distal pulses.   No murmur heard. Heart is regular at 72/m  Pulmonary/Chest: Effort normal and breath sounds normal. No respiratory distress. He has no wheezes. He has no rales. He exhibits no tenderness.  Basically clear anteriorly and posteriorly  Abdominal: Soft. Bowel sounds are normal. He exhibits no mass. There is no tenderness. There is no rebound and no guarding.  He has an incisional hernia midline. Other than that there is no liver or spleen enlargement and no inguinal adenopathy  Musculoskeletal: Normal range of motion. He exhibits no edema.  Lymphadenopathy:    He has no cervical adenopathy.  Neurological: He is alert and oriented to person, place, and time. He has normal reflexes. No cranial nerve deficit.  Lower extremity reflexes were somewhat asymmetric  Skin: Skin is warm and dry. No rash noted.  Psychiatric: He has a normal mood and affect. His behavior is normal. Judgment and thought content normal.  Nursing note and vitals reviewed.   BP 107/64 (BP Location: Left Arm)   Pulse 88   Temp 97.3 F (36.3 C) (Oral)   Ht 5' 10.5" (1.791 m)   Wt 163 lb (73.9 kg)   BMI 23.06 kg/m        Assessment & Plan:  1. Vitamin D deficiency -Continue current  treatment pending results of lab work - CBC with Differential/Platelet; Future - VITAMIN D 25 Hydroxy (Vit-D Deficiency, Fractures); Future  2. BPH (benign prostatic hyperplasia) -Continue follow-up with urology - CBC with Differential/Platelet; Future  3. Chronic obstructive pulmonary disease, unspecified COPD, unspecified chronic bronchitis type -Continue to follow-up with pulmonology continue current treatment pending results of chest x-ray - CBC with Differential/Platelet; Future  4. Thoracic aortic atherosclerosis ([place]) -[place] with aggressive therapeutic lifestyle changes which include exercise and diet - BMP8+EGFR; Future - CBC with Differential/Platelet; Future - Hepatic function panel; Future - NMR, lipoprofile; Future  5. Gastroesophageal reflux disease, esophagitis presence not specified -Continue follow-up with gastroenterology as needed - CBC with Differential/Platelet; Future - Hepatic function panel; Future  6. Other fatigue - Urinalysis, Complete - Lyme Ab/Western Blot Reflex; Future - Rocky mtn spotted fvr abs pnl(IgG+IgM); Future - Thyroid Panel With TSH; Future - DG Chest 2 View; Future  7. Malaise and fatigue -Uncertain about the cause of his major complaint and we will check lab work and go from there.  Patient Instructions  Medicare Annual Wellness Visit  [facility] and the medical providers at [facility] strive to bring you the best medical care.  In doing so we not only want to address your current medical conditions and concerns but also to detect new conditions early and prevent illness, disease and health-related problems.    Medicare offers a yearly Wellness Visit which allows our clinical staff to assess your need for preventative services including immunizations, lifestyle education, counseling to decrease risk of preventable diseases and screening for fall risk and other medical concerns.    This  visit is provided free of charge (no copay) for all Medicare recipients. The clinical pharmacists at [facility] have begun to conduct these Wellness Visits which will also include a thorough review of all your medications.    As you primary medical provider recommend that you make an appointment for your Annual Wellness Visit if you have not done so already this year.  You may set up this appointment before you leave today or you may call back [phone] and schedule an appointment.  Please make sure when you call that you mention that you are scheduling your Annual Wellness Visit with the clinical pharmacist so that the appointment may be made for the proper length of time.    Continue current medications. Continue good therapeutic lifestyle changes which include good diet and exercise. Fall precautions discussed with patient. If an FOBT was given today- please return it to our front desk. If you are over 58 years old - you may need Prevnar 56 or the adult Pneumonia vaccine.  After your visit with [place] today you will receive a survey in the mail or online from [employer] regarding your care with [place]. Please take a moment to fill this out. Your feedback is very important to [place] as you can help [place] better understand your patient needs as well as improve your experience and satisfaction. WE CARE ABOUT YOU!!!   The patient has had about 4 pounds weight loss since the last visit. He had a chest CT last November which basically showed COPD and emphysematous changes. He should return to the office fasting for the remainder of his lab work We will call with results as soon as they become available   Arrie Senate MD

## 2016-01-24 NOTE — Patient Instructions (Addendum)
Medicare Annual Wellness Visit  [facility] and the medical providers at [facility] strive to bring you the best medical care.  In doing so we not only want to address your current medical conditions and concerns but also to detect new conditions early and prevent illness, disease and health-related problems.    Medicare offers a yearly Wellness Visit which allows our clinical staff to assess your need for preventative services including immunizations, lifestyle education, counseling to decrease risk of preventable diseases and screening for fall risk and other medical concerns.    This visit is provided free of charge (no copay) for all Medicare recipients. The clinical pharmacists at [facility] have begun to conduct these Wellness Visits which will also include a thorough review of all your medications.    As you primary medical provider recommend that you make an appointment for your Annual Wellness Visit if you have not done so already this year.  You may set up this appointment before you leave today or you may call back [phone] and schedule an appointment.  Please make sure when you call that you mention that you are scheduling your Annual Wellness Visit with the clinical pharmacist so that the appointment may be made for the proper length of time.    Continue current medications. Continue good therapeutic lifestyle changes which include good diet and exercise. Fall precautions discussed with patient. If an FOBT was given today- please return it to our front desk. If you are over 57 years old - you may need Prevnar 48 or the adult Pneumonia vaccine.  After your visit with [place] today you will receive a survey in the mail or online from [employer] regarding your care with [place]. Please take a moment to fill this out. Your feedback is very important to [place] as you can help [place] better understand your patient needs as well as improve  your experience and satisfaction. WE CARE ABOUT YOU!!!   The patient has had about 4 pounds weight loss since the last visit. He had a chest CT last November which basically showed COPD and emphysematous changes. He should return to the office fasting for the remainder of his lab work We will call with results as soon as they become available

## 2016-01-25 ENCOUNTER — Other Ambulatory Visit: Payer: Medicare Other

## 2016-01-25 DIAGNOSIS — N400 Enlarged prostate without lower urinary tract symptoms: Secondary | ICD-10-CM | POA: Diagnosis not present

## 2016-01-25 DIAGNOSIS — J449 Chronic obstructive pulmonary disease, unspecified: Secondary | ICD-10-CM | POA: Diagnosis not present

## 2016-01-25 DIAGNOSIS — E559 Vitamin D deficiency, unspecified: Secondary | ICD-10-CM

## 2016-01-25 DIAGNOSIS — R5383 Other fatigue: Secondary | ICD-10-CM | POA: Diagnosis not present

## 2016-01-25 DIAGNOSIS — K219 Gastro-esophageal reflux disease without esophagitis: Secondary | ICD-10-CM

## 2016-01-25 DIAGNOSIS — I700 Atherosclerosis of aorta: Secondary | ICD-10-CM | POA: Diagnosis not present

## 2016-01-25 NOTE — Addendum Note (Signed)
Addended by: Nigel Berthold C on: 01/25/2016 10:23 AM   Modules accepted: Orders

## 2016-01-26 ENCOUNTER — Encounter: Payer: Self-pay | Admitting: Neurology

## 2016-01-26 LAB — NMR, LIPOPROFILE
CHOLESTEROL: 142 mg/dL (ref 100–199)
HDL Cholesterol by NMR: 51 mg/dL (ref >39)
HDL PARTICLE NUMBER: 29.3 umol/L — AB (ref >=30.5)
LDL Particle Number: 732 nmol/L (ref <1000)
LDL SIZE: 21.6 nm (ref >20.5)
LDL-C: 74 mg/dL (ref 0–99)
SMALL LDL PARTICLE NUMBER: 185 nmol/L (ref <=527)
Triglycerides by NMR: 84 mg/dL (ref 0–149)

## 2016-01-27 ENCOUNTER — Other Ambulatory Visit: Payer: Self-pay | Admitting: *Deleted

## 2016-01-27 LAB — CBC WITH DIFFERENTIAL/PLATELET
BASOS: 0 %
Basophils Absolute: 0 10*3/uL (ref 0.0–0.2)
EOS (ABSOLUTE): 0.2 10*3/uL (ref 0.0–0.4)
Eos: 2 %
HEMOGLOBIN: 13.7 g/dL (ref 12.6–17.7)
Hematocrit: 42 % (ref 37.5–51.0)
IMMATURE GRANS (ABS): 0 10*3/uL (ref 0.0–0.1)
Immature Granulocytes: 0 %
LYMPHS: 21 %
Lymphocytes Absolute: 1.9 10*3/uL (ref 0.7–3.1)
MCH: 29.8 pg (ref 26.6–33.0)
MCHC: 32.6 g/dL (ref 31.5–35.7)
MCV: 91 fL (ref 79–97)
MONOCYTES: 7 %
Monocytes Absolute: 0.6 10*3/uL (ref 0.1–0.9)
NEUTROS ABS: 6.4 10*3/uL (ref 1.4–7.0)
Neutrophils: 70 %
Platelets: 274 10*3/uL (ref 150–379)
RBC: 4.6 x10E6/uL (ref 4.14–5.80)
RDW: 14 % (ref 12.3–15.4)
WBC: 9.2 10*3/uL (ref 3.4–10.8)

## 2016-01-27 LAB — THYROID PANEL WITH TSH
FREE THYROXINE INDEX: 1.7 (ref 1.2–4.9)
T3 UPTAKE RATIO: 24 % (ref 24–39)
T4, Total: 7.1 ug/dL (ref 4.5–12.0)
TSH: 2.57 u[IU]/mL (ref 0.450–4.500)

## 2016-01-27 LAB — ROCKY MTN SPOTTED FVR ABS PNL(IGG+IGM)
RMSF IGM: 0.4 {index} (ref 0.00–0.89)
RMSF IgG: POSITIVE — AB

## 2016-01-27 LAB — BMP8+EGFR
BUN/Creatinine Ratio: 14 (ref 10–24)
BUN: 20 mg/dL (ref 8–27)
CALCIUM: 9 mg/dL (ref 8.6–10.2)
CHLORIDE: 104 mmol/L (ref 96–106)
CO2: 20 mmol/L (ref 18–29)
Creatinine, Ser: 1.45 mg/dL — ABNORMAL HIGH (ref 0.76–1.27)
GFR calc non Af Amer: 43 mL/min/{1.73_m2} — ABNORMAL LOW (ref >59)
GFR, EST AFRICAN AMERICAN: 49 mL/min/{1.73_m2} — AB (ref >59)
Glucose: 96 mg/dL (ref 65–99)
Potassium: 4.6 mmol/L (ref 3.5–5.2)
Sodium: 140 mmol/L (ref 134–144)

## 2016-01-27 LAB — HEPATIC FUNCTION PANEL
ALBUMIN: 3.9 g/dL (ref 3.5–4.7)
ALK PHOS: 80 IU/L (ref 39–117)
ALT: 7 IU/L (ref 0–44)
AST: 13 IU/L (ref 0–40)
BILIRUBIN, DIRECT: 0.1 mg/dL (ref 0.00–0.40)
Bilirubin Total: 0.3 mg/dL (ref 0.0–1.2)
TOTAL PROTEIN: 6.1 g/dL (ref 6.0–8.5)

## 2016-01-27 LAB — RMSF, IGG, IFA: RMSF, IGG, IFA: 1:64 {titer} — ABNORMAL HIGH

## 2016-01-27 LAB — LYME AB/WESTERN BLOT REFLEX: LYME DISEASE AB, QUANT, IGM: <0.8 index (ref 0.00–0.79)

## 2016-01-27 LAB — VITAMIN D 25 HYDROXY (VIT D DEFICIENCY, FRACTURES): Vit D, 25-Hydroxy: 50.5 ng/mL (ref 30.0–100.0)

## 2016-01-27 MED ORDER — DOXYCYCLINE HYCLATE 100 MG PO TABS: 100.0000 mg | ORAL_TABLET | Freq: Two times a day (BID) | ORAL | 0 refills | Status: DC

## 2016-01-31 ENCOUNTER — Other Ambulatory Visit: Payer: Medicare Other

## 2016-01-31 DIAGNOSIS — R718 Other abnormality of red blood cells: Secondary | ICD-10-CM

## 2016-01-31 LAB — URINALYSIS, COMPLETE
Bilirubin, UA: NEGATIVE
Glucose, UA: NEGATIVE
LEUKOCYTES UA: NEGATIVE
Nitrite, UA: NEGATIVE
PH UA: 5 (ref 5.0–7.5)
PROTEIN UA: NEGATIVE
RBC, UA: NEGATIVE
Specific Gravity, UA: 1.02 (ref 1.005–1.030)
Urobilinogen, Ur: 0.2 mg/dL (ref 0.2–1.0)

## 2016-01-31 LAB — MICROSCOPIC EXAMINATION
BACTERIA UA: NONE SEEN
Epithelial Cells (non renal): NONE SEEN /hpf (ref 0–10)
RBC MICROSCOPIC, UA: NONE SEEN /HPF (ref 0–?)
WBC UA: NONE SEEN /HPF (ref 0–?)

## 2016-02-10 ENCOUNTER — Encounter: Payer: Self-pay | Admitting: Neurology

## 2016-02-13 ENCOUNTER — Encounter: Payer: Self-pay | Admitting: Family Medicine

## 2016-02-14 ENCOUNTER — Encounter: Payer: Self-pay | Admitting: Cardiology

## 2016-02-14 DIAGNOSIS — R310 Gross hematuria: Secondary | ICD-10-CM | POA: Diagnosis not present

## 2016-02-16 ENCOUNTER — Ambulatory Visit: Payer: Medicare Other | Admitting: Neurology

## 2016-02-20 ENCOUNTER — Encounter: Payer: Self-pay | Admitting: Family Medicine

## 2016-02-24 DIAGNOSIS — R310 Gross hematuria: Secondary | ICD-10-CM | POA: Diagnosis not present

## 2016-03-08 ENCOUNTER — Ambulatory Visit: Payer: Medicare Other

## 2016-03-13 ENCOUNTER — Ambulatory Visit (INDEPENDENT_AMBULATORY_CARE_PROVIDER_SITE_OTHER): Payer: Medicare Other | Admitting: Family

## 2016-03-13 ENCOUNTER — Other Ambulatory Visit: Payer: Self-pay | Admitting: Family

## 2016-03-13 ENCOUNTER — Ambulatory Visit (HOSPITAL_COMMUNITY)
Admission: RE | Admit: 2016-03-13 | Discharge: 2016-03-13 | Disposition: A | Discharge status: Home / Self Care | Payer: Medicare Other | Source: Ambulatory Visit | Attending: Family | Admitting: Family

## 2016-03-13 ENCOUNTER — Ambulatory Visit: Payer: Medicare Other

## 2016-03-13 VITALS — BP 117/63 | HR 71 | Temp 98.0°F | Ht 70.5 in | Wt 166.0 lb

## 2016-03-13 DIAGNOSIS — M79661 Pain in right lower leg: Secondary | ICD-10-CM

## 2016-03-13 DIAGNOSIS — M79604 Pain in right leg: Secondary | ICD-10-CM

## 2016-03-13 DIAGNOSIS — M79651 Pain in right thigh: Secondary | ICD-10-CM

## 2016-03-13 DIAGNOSIS — Z23 Encounter for immunization: Secondary | ICD-10-CM | POA: Diagnosis not present

## 2016-03-13 DIAGNOSIS — M79605 Pain in left leg: Secondary | ICD-10-CM | POA: Insufficient documentation

## 2016-03-13 DIAGNOSIS — M79662 Pain in left lower leg: Secondary | ICD-10-CM | POA: Diagnosis not present

## 2016-03-13 NOTE — Progress Notes (Signed)
   Subjective:    Patient ID: Charles Golden, male    DOB: 01/24/1926, 82 y.o.   MRN: [id number]  PT presents to the office today with right "squezzing" pain that comes and goes that started about 1 pm today. PT states the pain will come and go at times and moves from his ankle, calf, and thigh. PT had thought it was a "muscle cramp" at first, but it have continued to hurt him and was worried. PT denies any chest pain, new SOB, or palpations.  Leg Pain   The incident occurred 1 to 3 hours ago. There was no injury mechanism. The pain is present in the right knee, right thigh and right leg. The quality of the pain is described as cramping. The pain is at a severity of 8/10. The pain is moderate. The pain has been intermittent since onset. Associated symptoms include numbness. He reports no foreign bodies present. Nothing aggravates the symptoms. He has tried nothing for the symptoms. The treatment provided no relief.      Review of Systems  Respiratory: Negative for chest tightness and shortness of breath ("has COPD, but no new SOB").   Musculoskeletal:       Right leg pain   Neurological: Positive for numbness.  All other systems reviewed and are negative.      Objective:   Physical Exam  Constitutional: He is oriented to person, place, and time. He appears well-developed and well-nourished. No distress.  HENT:  Head: Normocephalic.  Eyes: Pupils are equal, round, and reactive to light. Right eye exhibits no discharge. Left eye exhibits no discharge.  Neck: Normal range of motion. Neck supple. No thyromegaly present.  Cardiovascular: Normal rate, regular rhythm, normal heart sounds and intact distal pulses.   No murmur heard. Pulmonary/Chest: Effort normal and breath sounds normal. No respiratory distress. He has no wheezes.  Abdominal: Soft. Bowel sounds are normal. He exhibits no distension. There is no tenderness.  Musculoskeletal: Normal range of motion. He exhibits no edema or  tenderness.  Pain in right calf and  thigh. No swelling, erythemas, or heat  Neurological: He is alert and oriented to person, place, and time.  Skin: Skin is warm and dry. No rash noted. No erythema.  Psychiatric: He has a normal mood and affect. His behavior is normal. Judgment and thought content normal.  Vitals reviewed.     BP 117/63 (BP Location: Right Arm, Patient Position: Sitting, Cuff Size: Normal)   Pulse 71   Temp 98 F (36.7 C) (Oral)   Ht 5' 10.5" (1.791 m)   Wt 166 lb (75.3 kg)   BMI 23.48 kg/m      Assessment & Plan:  1. Pain of right lower extremity - Ultrasound doppler venous legs bilat; Future  2. Right calf pain - Ultrasound doppler venous legs bilat; Future  3. Right thigh pain - Ultrasound doppler venous legs bilat; Future  DVT vs muscle cramp? PT states he is having a burning, squeezing pain. Will rule out DVT. Stat doppler pending DVT prevention discussed RTO prn   Evelina Dun, FNP

## 2016-03-13 NOTE — Patient Instructions (Signed)
Venous Thromboembolism Prevention Venous thromboembolism (VTE) is a condition in which a blood clot (thrombus) develops in the body. A thrombus usually occurs in a deep vein in the leg or the pelvis (DVT), but it can also occur in the arm. Sometimes, pieces of a thrombus can break off from its original place of development and travel through the bloodstream to other parts of the body. When that happens, the thrombus is called an embolus. An embolus that travels to one or both lungs is called a pulmonary embolism. An embolism can block the blood flow in the blood vessels of other organs as well. VTE is a serious health condition that can cause disability or death. It is very important to get help right away and to not ignore symptoms. HOW CAN A VTE BE PREVENTED?  Exercise regularly. Take a brisk 30 minute walk every day. Staying active and moving around can help you to prevent blood clots.  Avoid sitting or lying in bed for long periods of time without moving your legs. Change your position often, especially during long-distance travel (over 4 hours).  If you are a woman who is over 53 years of age, avoid unnecessary use of medicines that contain estrogen. These include birth control pills and hormone replacement therapy.  Do not smoke, especially if you take estrogen medicines. If you need help quitting, ask your health care provider.  Eat plenty of fruits and vegetables. Ask your health care provider or dietitian if there are foods that you should avoid.  Maintain a weight that is appropriate for your height. Ask your health care provider what weight is healthy for you.  Wear loose-fitting clothing. Avoid constrictive or tight clothing around your legs or waist.  Try not to bump or injure your legs. Avoid crossing your legs when you are sitting.  Do not use pillows under your knees while lying down unless told by your health care provider.  Wear support hose (compression stockings or TED  hose) as told by your health care provider Compression stockings increase blood flow in your legs and can help prevent blood clots. Do not let them bunch up when you are wearing them. HOW CAN I PREVENT VTE WHEN I TRAVEL? Long-distance travel (over 4 hours) can increase the risk of a VTE. To prevent VTE when traveling:  Exercise your legs every hour by standing, stretching, and bending and straightening your legs. If you are traveling by airplane, train, or bus, walk up and down the aisle as often as possible to get your blood moving. If you are traveling by car, stop and get out of the car every hour to exercise your legs and stretch. Other types of exercise might include:  Keeping your feet flat on the ground and raising your toes.  Switching from tightening the muscles in your calves and thighs to relaxing those same muscles while you are sitting.  Pointing and flexing your feet at the ankle joints while you are sitting.  Stay well hydrated while traveling. Drink enough water to keep your urine clear or pale yellow.  Avoid drinking alcohol during long travel. Generally, it is not recommended that you take medicines to prevent DVT during routine travel. HOW CAN VTE BE PREVENTED IF I AM HOSPITALIZED? A VTE may be prevented by taking medicines that are prescribed to prevent blood clots (anticoagulants). You can also help to prevent VTE while in the hospital by taking these actions:  Get out of bed and walk. Ask your health care provider  if this is safe for you to do.  Request the use of a sequential compression device (SCD). This is a machine that pumps air into compression sleeves that are wrapped around your legs.  Request the use of compression stockings, which are tight, elastic stockings that apply pressure to the lower legs. Compression stockings are sometimes used with SCDs. HOW CAN I PREVENT VTE AFTER SURGERY? Understand that there is an increased risk for VTE for the first 4-6 weeks  after surgery. During this time:  Avoid long-distance travel (over 4 hours). If you must travel during this time, ask your health care provider about additional preventive actions that you can take. These might include exercising your arms and legs every hour while you travel.  Avoid sitting or lying still for too long. If possible, get up and walk around one time every hour. Ask your health care provider when this is safe for you to do. SEEK IMMEDIATE MEDICAL CARE IF:  You have new or increased pain, swelling, or redness in an arm or leg.  You have numbness or tingling in an arm or leg.  You have shortness of breath while active or at rest.  You have chest pain.  You have a rapid or irregular heartbeat.  You feel light-headed or dizzy.  You cough up blood.  You notice blood in your vomit, bowel movement, or urine. These symptoms may represent a serious problem that is an emergency. Do not wait to see if the symptoms will go away. Get medical help right away. Call your local emergency services (911 in the [place] Do not drive yourself to the hospital.   This information is not intended to replace advice given to you by your health care provider. Make sure you discuss any questions you have with your health care provider.   Document Released: 05/02/2009 Document Revised: 02/02/2015 Document Reviewed: 09/08/2014 Elsevier Interactive Patient Education [employer].

## 2016-03-16 ENCOUNTER — Encounter: Payer: Self-pay | Admitting: Family Medicine

## 2016-03-19 ENCOUNTER — Telehealth: Payer: Self-pay | Admitting: Family Medicine

## 2016-03-19 DIAGNOSIS — M545 Low back pain: Secondary | ICD-10-CM

## 2016-03-20 ENCOUNTER — Encounter (INDEPENDENT_AMBULATORY_CARE_PROVIDER_SITE_OTHER): Payer: Medicare Other

## 2016-03-20 ENCOUNTER — Ambulatory Visit (INDEPENDENT_AMBULATORY_CARE_PROVIDER_SITE_OTHER): Payer: Medicare Other

## 2016-03-20 DIAGNOSIS — M545 Low back pain: Secondary | ICD-10-CM

## 2016-03-20 NOTE — Telephone Encounter (Signed)
Order placed and pt will come today between 2-4

## 2016-03-22 ENCOUNTER — Encounter: Payer: Self-pay | Admitting: Family Medicine

## 2016-03-22 ENCOUNTER — Telehealth: Payer: Self-pay

## 2016-03-22 DIAGNOSIS — M47816 Spondylosis without myelopathy or radiculopathy, lumbar region: Secondary | ICD-10-CM

## 2016-03-22 NOTE — Telephone Encounter (Signed)
Please refer patient to Dr. Nelva Bush as soon as possible due to his severe back pain and the x-ray findings from this office. Please make sure that the orthopedist is aware that he has had x-rays done here and send a copy of that report

## 2016-03-25 ENCOUNTER — Encounter: Payer: Self-pay | Admitting: Family Medicine

## 2016-04-03 DIAGNOSIS — J342 Deviated nasal septum: Secondary | ICD-10-CM | POA: Diagnosis not present

## 2016-04-03 DIAGNOSIS — R51 Headache: Secondary | ICD-10-CM | POA: Diagnosis not present

## 2016-04-03 DIAGNOSIS — C44311 Basal cell carcinoma of skin of nose: Secondary | ICD-10-CM | POA: Diagnosis not present

## 2016-04-03 DIAGNOSIS — K219 Gastro-esophageal reflux disease without esophagitis: Secondary | ICD-10-CM | POA: Diagnosis not present

## 2016-04-06 DIAGNOSIS — M48062 Spinal stenosis, lumbar region with neurogenic claudication: Secondary | ICD-10-CM | POA: Diagnosis not present

## 2016-04-11 ENCOUNTER — Ambulatory Visit (INDEPENDENT_AMBULATORY_CARE_PROVIDER_SITE_OTHER)
Admission: RE | Admit: 2016-04-11 | Discharge: 2016-04-11 | Disposition: A | Discharge status: Home / Self Care | Payer: Medicare Other | Source: Ambulatory Visit | Attending: Pulmonary Disease | Admitting: Pulmonary Disease

## 2016-04-11 ENCOUNTER — Encounter: Payer: Self-pay | Admitting: Pulmonary Disease

## 2016-04-11 ENCOUNTER — Ambulatory Visit (INDEPENDENT_AMBULATORY_CARE_PROVIDER_SITE_OTHER): Payer: Medicare Other | Admitting: Pulmonary Disease

## 2016-04-11 VITALS — BP 126/84 | HR 65 | Ht 70.5 in | Wt 168.0 lb

## 2016-04-11 DIAGNOSIS — J209 Acute bronchitis, unspecified: Secondary | ICD-10-CM | POA: Insufficient documentation

## 2016-04-11 DIAGNOSIS — J471 Bronchiectasis with (acute) exacerbation: Secondary | ICD-10-CM | POA: Diagnosis not present

## 2016-04-11 DIAGNOSIS — R931 Abnormal findings on diagnostic imaging of heart and coronary circulation: Secondary | ICD-10-CM

## 2016-04-11 DIAGNOSIS — J208 Acute bronchitis due to other specified organisms: Secondary | ICD-10-CM

## 2016-04-11 DIAGNOSIS — K219 Gastro-esophageal reflux disease without esophagitis: Secondary | ICD-10-CM

## 2016-04-11 DIAGNOSIS — R0602 Shortness of breath: Secondary | ICD-10-CM | POA: Diagnosis not present

## 2016-04-11 MED ORDER — RANITIDINE HCL 150 MG PO TABS: 150.0000 mg | ORAL_TABLET | Freq: Every day | ORAL | 3 refills | Status: DC

## 2016-04-11 MED ORDER — DOXYCYCLINE HYCLATE 100 MG PO TABS: 100.0000 mg | ORAL_TABLET | Freq: Two times a day (BID) | ORAL | 0 refills | Status: DC

## 2016-04-11 NOTE — Progress Notes (Signed)
Subjective:    Patient ID: Charles Golden, male    DOB: [birth year], 82 y.o.   MRN: [id number]  C.C.:  Follow-up for Chronic Bronchitis/Mild Bronchiectasis & GERD.  HPI  Reports he was seen on 10/31 by his dentist for an infected tooth and was started on Cephalexin. He completed his last dose this morning.   Chronic Bronchitis/Mild Bronchiectasis:  Has remained off Symbicort. Encouraged to use Mucinex & Flutter Valve/Acapella at last appointment for airway clearance. Reports he was doing well until 11/11 and awoke in the middle of the night with chest congestion & cough producing an increased amount of mucus. Previously was wheezing. Reports his cough is producing a "yellow, green" mucus. He was started on Prednisone for some leg pain starting 11/11. In the office his cough produces an opaque mucus. He has noticed some minimal dyspnea.   GERD:  On no medications. Managed by diet. He reports he did wake up with some heartburn on Friday. Denies any specific morning brash water taste.   Review of Systems No fever or sweats. Possibly minimal chills. He denies any chest tightness or pressure. He reports he has no sinus congestion, pressure, or drainage. Reports he has had a sore throat but isn't sure if it was before or after his cough started.   Allergies  Allergen Reactions  . Demerol [Meperidine] Other (See Comments)    Pt. States "he woke up during a colonoscopy"  . Meperidine Hcl Other (See Comments)    Unknown     Current Outpatient Prescriptions on File Prior to Visit  Medication Sig Dispense Refill  . azelastine (ASTELIN) 0.1 % nasal spray PLACE 1 SPRAY INTO THE NOSE 1 DAY OR 1 DOSE. USE IN EACH NOSTRIL AS DIRECTED 30 mL 11  . Cholecalciferol (VITAMIN D3) 2000 UNITS TABS Take 1 tablet by mouth daily.    . Cyanocobalamin (VITAMIN B-12) 5000 MCG SUBL Place 1 tablet under the tongue daily.    Marland Kitchen doxazosin (CARDURA) 8 MG tablet Take 0.5-1 tablets (4-8 mg total) by mouth daily. 30 tablet 11    . fluticasone (FLONASE) 50 MCG/ACT nasal spray 1 spray in each nostril qd 16 g 11  . guaiFENesin (MUCINEX) 600 MG 12 hr tablet Take 600 mg by mouth as needed. Reported on 05/25/2015    . latanoprost (XALATAN) 0.005 % ophthalmic solution Place 1 drop into the left eye at bedtime.     . Misc. Devices (ACAPELLA) MISC Use as directed 1 each 0  . Probiotic Product (ALIGN) 4 MG CAPS Take 1 tablet by mouth daily.     No current facility-administered medications on file prior to visit.     Past Medical History:  Diagnosis Date  . Bronchitis, chronic ([place])   . COPD (chronic obstructive pulmonary disease) ([place])   . Enlarged prostate   . GERD (gastroesophageal reflux disease)   . Glucagonoma   . Hernia, incisional    at present  . HOH (hard of hearing)   . IBS (irritable bowel syndrome)   . Sciatic pain   . Sinus congestion     Past Surgical History:  Procedure Laterality Date  . BOWEL RESECTION N/A 09/04/2012   Procedure: SMALL BOWEL RESECTION;  Surgeon: Donato Heinz, MD;  Location: [facility];  Service: General;  Laterality: N/A;  Anastimosis  . HEMORRHOID SURGERY    . HERNIA REPAIR Right 70's  . INGUINAL HERNIA REPAIR Left 09/14/2013   Procedure: LEFT INGUINAL HERNIORRHAPHY;  Surgeon: Jamesetta So, MD;  Location: [facility];  Service: General;  Laterality: Left;  . INSERTION OF MESH Left 09/14/2013   Procedure: INSERTION OF MESH;  Surgeon: Jamesetta So, MD;  Location: [facility];  Service: General;  Laterality: Left;  . KNEE ARTHROSCOPY Left 07/21/2014   Procedure: LEFT KNEE ARTHROSCOPY WITH MEDIAL MENISCAL DEBRIDEMENT ;  Surgeon: Gearlean Alf, MD;  Location: [facility];  Service: Orthopedics;  Laterality: Left;  . LAPAROTOMY N/A 09/04/2012   Procedure: EXPLORATORY LAPAROTOMY;  Surgeon: Donato Heinz, MD;  Location: [facility];  Service: General;  Laterality: N/A;  . SKIN LESION EXCISION     [name]  . TONSILLECTOMY      Family History  Problem Relation Age of Onset  . Heart disease  Mother     Valve replacement and pacemaker  . Colon cancer Father   . Asthma      cousin    Social History   Social History  . Marital status: Married    Spouse name: [name]  . Number of children: 3  . Years of education: 12+   Occupational History  . Retired     Immunologist   Social History Main Topics  . Smoking status: Former Smoker    Packs/day: 1.00    Years: 30.00    Types: Cigarettes    Quit date: 05/28/2006  . Smokeless tobacco: Never Used  . Alcohol use 0.0 oz/week     Comment: Rare beer or wine.  . Drug use: No  . Sexual activity: Yes    Birth control/ protection: None   Other Topics Concern  . None   Social History Narrative   Patient lives at home with [name].    Patient has 3 children.    Patient has his BS   Patient is retired.    Drinks about 2 cups of coffee per day.      [facility]:   He is still married. 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 He is from [place].             Objective:   Physical Exam BP 126/84 (BP Location: Left Arm, Cuff Size: Normal)   Pulse 65   Ht 5' 10.5" (1.791 m)   Wt 168 lb (76.2 kg)   SpO2 97%   BMI 23.76 kg/m  General:  Elderly male. Comfortable. Alert. Integument:  Warm & dry. No rash on exposed skin.  HEENT:  Moist mucus membranes. No scleral icterus. No nasal turbinate swelling. Cardiovascular:  Regular rate & rhythm. Normal S1 & S2. Pulmonary:  Coarse breath sounds bilaterally with mild wheezing in apices. Normal work of breathing on room air.  Abdomen: Soft. Normal bowel sounds. Nontender.  PFT 12/20/15: FVC 3.41 L (91%) FEV1 2.21 L (85%) FEV1/FVC 0.65 FEF 25-75 1.09 L (68%) negative bronchodilator response 07/13/15: FVC 3.63 L (96%) FEV1 2.40 L (92%) FEV1/FVC 0.66 FEF 25-75 1.17 L (71%) positive bronchodilator response TLC 8.07 L (112%) RV 143% ERV 106% DLCO corrected 75%  06/22/13: FVC 3.74 L (89%) FEV1  2.39 L (77%) FEV1/FVC 0.64 FEF 25-75 1.17 L (46%)  6MWT 12/21/15:  Walked 360 meters / Baseline Sat 99% on RA / Nadir Sat 97% 07/13/15:  Walked 360 meters / Baseline Sat 97% on RA / Nadir Sat 97% on RA   IMAGING CT CHEST W/O 04/19/15 (previously reviewed by me): Mild centrilobular emphysematous changes. Subcentimeter peripheral left upper lobe nodules noted.  Small hiatal hernia. No pericardial effusion. No pleural effusion or thickening. Calcified nodules within spleen noted.  CT CHEST W/O 05/04/03 (previously reviewed by me): Mild cylindrical bronchiectasis with linear opacity particularly in the right lower lobe suggestive of scar formation. Some hilar node calcification especially on the left. 2 separate subcentimeter nodules within the inferior segment of the lingula one of which is at least partially calcified. No pleural effusion or thickening appreciated. No pericardial effusion.  MICROBIOLOGY Sputum Ctx (11/09/15):  Oral Flora / Fungus negative / AFB negative Sputum (04/19/15): Oral Flora/AFB negative/Fungal negative  LABS 04/11/15 Alpha-1 antitrypsin: MM (153) IgM: 31 IgA: 195 IgG: 822    Assessment & Plan:  82 y.o. male with a long history with chronic bronchitis/mild bronchiectasis & GERD. Patient does appear to have mild acute bronchitis likely from an episode of reflux and possible aspiration given the acute onset on Friday evening while asleep. Patient has not been consistently using his airway clearance maneuvers. I believe he would benefit from additional antibiotic therapy with anti-inflammatory properties therefore switching from Keflex and over to doxycycline. He will need to be assessed for possible pneumonia with x-ray imaging. I instructed the patient to contact my office if he had any further questions or concerns.  1. Acute Bronchitis:  Checking sputum culture for AFB, fungus, and bacteria. Checking chest x-ray PA/LAT today. Continuing patient on 7 day course of empiric  doxycycline. 2. Chronic Bronchitis/Mild Bronchiectasis: Mild exacerbation today. Patient encouraged to use Mucinex twice daily and flutter valve as instructed for airway clearance. 3. GERD:  Suspect silent reflux in the middle of the night. Patient starting on Zantac 75 mg by mouth daily at bedtime. 4. Health Maintenance:  S/P Influenza Vaccine October 2017, Pneumovax January 1999 & Prevnar July 2015. 5. Follow-up: Patient to return to clinic in January as previously scheduled. Scheduling him a follow-up appointment in the next 2 weeks in our office which can be canceled if he significantly improves.  Sonia Baller Ashok Cordia, M.D. [facility] Pager:  [phone] After 3pm or if no response, call [phone] 9:32 AM 04/11/16

## 2016-04-11 NOTE — Patient Instructions (Signed)
   Avoid taking any dairy products with the Doxycycline we are starting today.  Make sure to take the Doxycycline with a full glass of water and remain upright for 1 hour afterward.  I'm starting you on Zantac at night before bed because I think you had some reflux that went into your lungs causing your cough & bronchitis.  If you feel 100% better you can cancel your appointment in a couple of weeks but we will keep you appointment in January as scheduled.  We are doing a culture of your sputum. Early morning specimens are the best and it should not be refrigerated. It should be dropped off to the lab within 4 hours.  Remember to use your Flutter Valve/Acapella to clear out the mucus in your lungs. Keep using your Mucinex twice daily.  Call me if you have any questions or feel you are getting worse.  TESTS ORDERED: 1. CXR PA/LAT TODAY 2. Sputum Culture for AFB/Fungus/Bacteria

## 2016-04-13 ENCOUNTER — Other Ambulatory Visit: Payer: Medicare Other

## 2016-04-13 DIAGNOSIS — J471 Bronchiectasis with (acute) exacerbation: Secondary | ICD-10-CM | POA: Diagnosis not present

## 2016-04-16 LAB — RESPIRATORY CULTURE OR RESPIRATORY AND SPUTUM CULTURE: ORGANISM ID, BACTERIA: NORMAL

## 2016-04-25 ENCOUNTER — Encounter: Payer: Self-pay | Admitting: Adult Health

## 2016-04-25 ENCOUNTER — Ambulatory Visit (INDEPENDENT_AMBULATORY_CARE_PROVIDER_SITE_OTHER): Payer: Medicare Other | Admitting: Adult Health

## 2016-04-25 VITALS — BP 118/62 | HR 98 | Temp 98.1°F | Ht 70.5 in | Wt 161.4 lb

## 2016-04-25 DIAGNOSIS — J449 Chronic obstructive pulmonary disease, unspecified: Secondary | ICD-10-CM | POA: Diagnosis not present

## 2016-04-25 DIAGNOSIS — R062 Wheezing: Secondary | ICD-10-CM | POA: Diagnosis not present

## 2016-04-25 DIAGNOSIS — R931 Abnormal findings on diagnostic imaging of heart and coronary circulation: Secondary | ICD-10-CM | POA: Diagnosis not present

## 2016-04-25 MED ORDER — TIOTROPIUM BROMIDE-OLODATEROL 2.5-2.5 MCG/ACT IN AERS: 2.0000 | INHALATION_SPRAY | Freq: Every day | RESPIRATORY_TRACT | 0 refills | Status: AC

## 2016-04-25 MED ORDER — TIOTROPIUM BROMIDE-OLODATEROL 2.5-2.5 MCG/ACT IN AERS: 2.0000 | INHALATION_SPRAY | Freq: Every day | RESPIRATORY_TRACT | 5 refills | Status: DC

## 2016-04-25 MED ORDER — LEVALBUTEROL HCL 0.63 MG/3ML IN NEBU
0.6300 mg | INHALATION_SOLUTION | Freq: Once | RESPIRATORY_TRACT | Status: AC
  Administered 2016-04-25: 0.63 mg via RESPIRATORY_TRACT

## 2016-04-25 NOTE — Progress Notes (Signed)
Patient ID: Charles Golden, male   DOB: 05-22-1926, 82 y.o.   MRN: [id number] Patient seen in the office today and instructed on use of stiolto respimat.  Patient expressed understanding and demonstrated technique.

## 2016-04-25 NOTE — Progress Notes (Signed)
Subjective:    Patient ID: Charles Golden, male    DOB: [birth year], 82 y.o.   MRN: [id number]  HPI 82 yo male former smoker with COPD  Spirometry from 06/22/2013 revealed FEV1 77% predicted FVC 89% predicted FEV1 to FVC ratio 64% of predicted   04/25/2016 Follow up : COPD  Pt returns for 2 week follow up . Seen last ov with slow to resolve COPD flare after abx and steroids . He was given doxycycline  And started on Zantac for GERD component  CXR showed no acute process. Sputum culture showed normal flora.  He says he has not seen much change in his daily congestion and cough .  Still has reflux despite zantac At bedtime  .  Previously on Symbicort in past but no longer taking as he does not feel it helped that much .  Does have intermittent wheezing but not all the time  Uses mucinex and flutter valve.  Denies chest pain , orthopnea , edema or fever.    Past Medical History:  Diagnosis Date  . Bronchitis, chronic ([place])   . COPD (chronic obstructive pulmonary disease) ([place])   . Enlarged prostate   . GERD (gastroesophageal reflux disease)   . Glucagonoma   . Hernia, incisional    at present  . HOH (hard of hearing)   . IBS (irritable bowel syndrome)   . Sciatic pain   . Sinus congestion    Current Outpatient Prescriptions on File Prior to Visit  Medication Sig Dispense Refill  . azelastine (ASTELIN) 0.1 % nasal spray PLACE 1 SPRAY INTO THE NOSE 1 DAY OR 1 DOSE. USE IN EACH NOSTRIL AS DIRECTED 30 mL 11  . Cholecalciferol (VITAMIN D3) 2000 UNITS TABS Take 1 tablet by mouth daily.    . Cyanocobalamin (VITAMIN B-12) 5000 MCG SUBL Place 1 tablet under the tongue daily.    Marland Kitchen doxazosin (CARDURA) 8 MG tablet Take 0.5-1 tablets (4-8 mg total) by mouth daily. 30 tablet 11  . finasteride (PROSCAR) 5 MG tablet Take 1 tablet by mouth daily.  10  . fluticasone (FLONASE) 50 MCG/ACT nasal spray 1 spray in each nostril qd 16 g 11  . guaiFENesin (MUCINEX) 600 MG 12 hr tablet Take 600 mg by  mouth as needed. Reported on 05/25/2015    . latanoprost (XALATAN) 0.005 % ophthalmic solution Place 1 drop into the left eye at bedtime.     . Misc. Devices (ACAPELLA) MISC Use as directed 1 each 0  . Probiotic Product (ALIGN) 4 MG CAPS Take 1 tablet by mouth daily.    . ranitidine (ZANTAC) 150 MG tablet Take 1 tablet (150 mg total) by mouth at bedtime. (Patient taking differently: Take 150 mg by mouth 2 (two) times daily. ) 30 tablet 3   No current facility-administered medications on file prior to visit.       Review of Systems Constitutional:   No  weight loss, night sweats,  Fevers, chills,  +fatigue, or  lassitude.  HEENT:   No headaches,  Difficulty swallowing,  Tooth/dental problems, or  Sore throat,                No sneezing, itching, ear ache,  +nasal congestion, post nasal drip,   CV:  No chest pain,  Orthopnea, PND, swelling in lower extremities, anasarca, dizziness, palpitations, syncope.   GI  +heartburn, indigestion, no abdominal pain, nausea, vomiting, diarrhea, change in bowel habits, loss of appetite, bloody stools.   Resp:  No chest wall deformity  Skin: no rash or lesions.  GU: no dysuria, change in color of urine, no urgency or frequency.  No flank pain, no hematuria   MS:  No joint pain or swelling.  No decreased range of motion.  No back pain.  Psych:  No change in mood or affect. No depression or anxiety.  No memory loss.         Objective:   Physical Exam   Vitals:   04/25/16 0959  BP: 118/62  Pulse: 98  Temp: 98.1 F (36.7 C)  TempSrc: Oral  SpO2: 97%  Weight: 161 lb 6.4 oz (73.2 kg)  Height: 5' 10.5" (1.791 m)    GEN: A/Ox3; pleasant , NAD, elderly     HEENT:  [place]/AT,  EACs-clear, TMs-wnl, NOSE-clear, THROAT-clear, no lesions, no postnasal drip or exudate noted.   NECK:  Supple w/ fair ROM; no JVD; normal carotid impulses w/o bruits; no thyromegaly or nodules palpated; no lymphadenopathy.    RESP  Tr rhonchi  , no accessory  muscle use, no dullness to percussion  CARD:  RRR, no m/r/g  , no peripheral edema, pulses intact, no cyanosis or clubbing.  GI:   Soft & nt; nml bowel sounds; no organomegaly or masses detected.   Musco: Warm bil, no deformities or joint swelling noted.   Neuro: alert, no focal deficits noted.    Skin: Warm, no lesions or rashes     CXR 04/11/16  COPD changes  Shital Crayton NP-C  [facility]  04/25/16

## 2016-04-25 NOTE — Patient Instructions (Addendum)
Increase Zantac 150mg  Twice daily   GERD diet .  Begin Stiolto 2 puffs daily . Rinse after use.  Mucinex Twice daily As needed  Congestion  Flutter valve Three times a day  .  Please contact office for sooner follow up if symptoms do not improve or worsen or seek emergency care  follow up .Dr. Ashok Cordia in 6 weeks as planned.

## 2016-04-25 NOTE — Assessment & Plan Note (Signed)
Slow to resolve flare w/ GERD trigger Increase GERD control  Stiolto trial    Plan  Patient Instructions  Increase Zantac 150mg  Twice daily   GERD diet .  Begin Stiolto 2 puffs daily . Rinse after use.  Mucinex Twice daily As needed  Congestion  Flutter valve Three times a day  .  Please contact office for sooner follow up if symptoms do not improve or worsen or seek emergency care  follow up .Dr. Ashok Cordia in 6 weeks as planned.

## 2016-05-09 DIAGNOSIS — R197 Diarrhea, unspecified: Secondary | ICD-10-CM | POA: Diagnosis not present

## 2016-05-09 DIAGNOSIS — R1032 Left lower quadrant pain: Secondary | ICD-10-CM | POA: Diagnosis not present

## 2016-05-18 DIAGNOSIS — R197 Diarrhea, unspecified: Secondary | ICD-10-CM | POA: Diagnosis not present

## 2016-05-22 IMAGING — CR DG KNEE 1-2V*L*
2 series · 2 of 2 positions shown · non-contrast
Comparison: None.

CLINICAL DATA: Left knee pain.  No known injury.

EXAM:
LEFT KNEE - 1-2 VIEW

[view not recorded (1 of 2)]
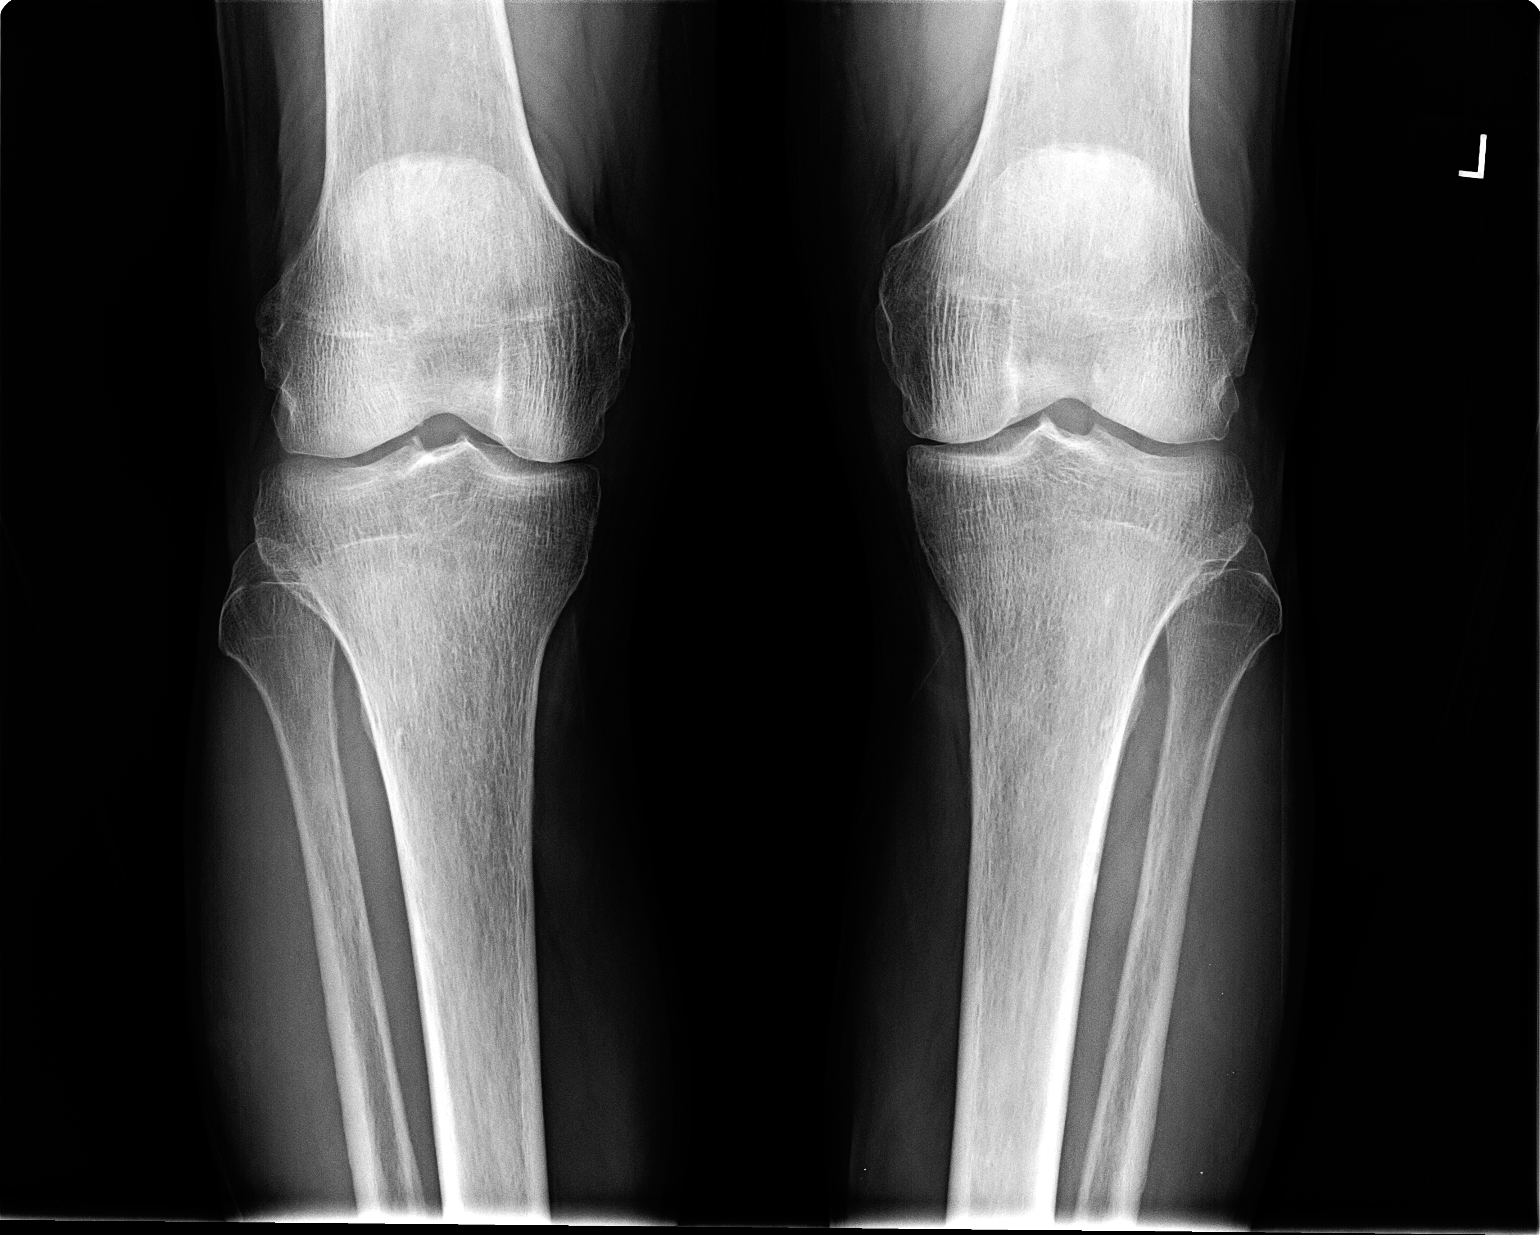

[view not recorded (2 of 2)]
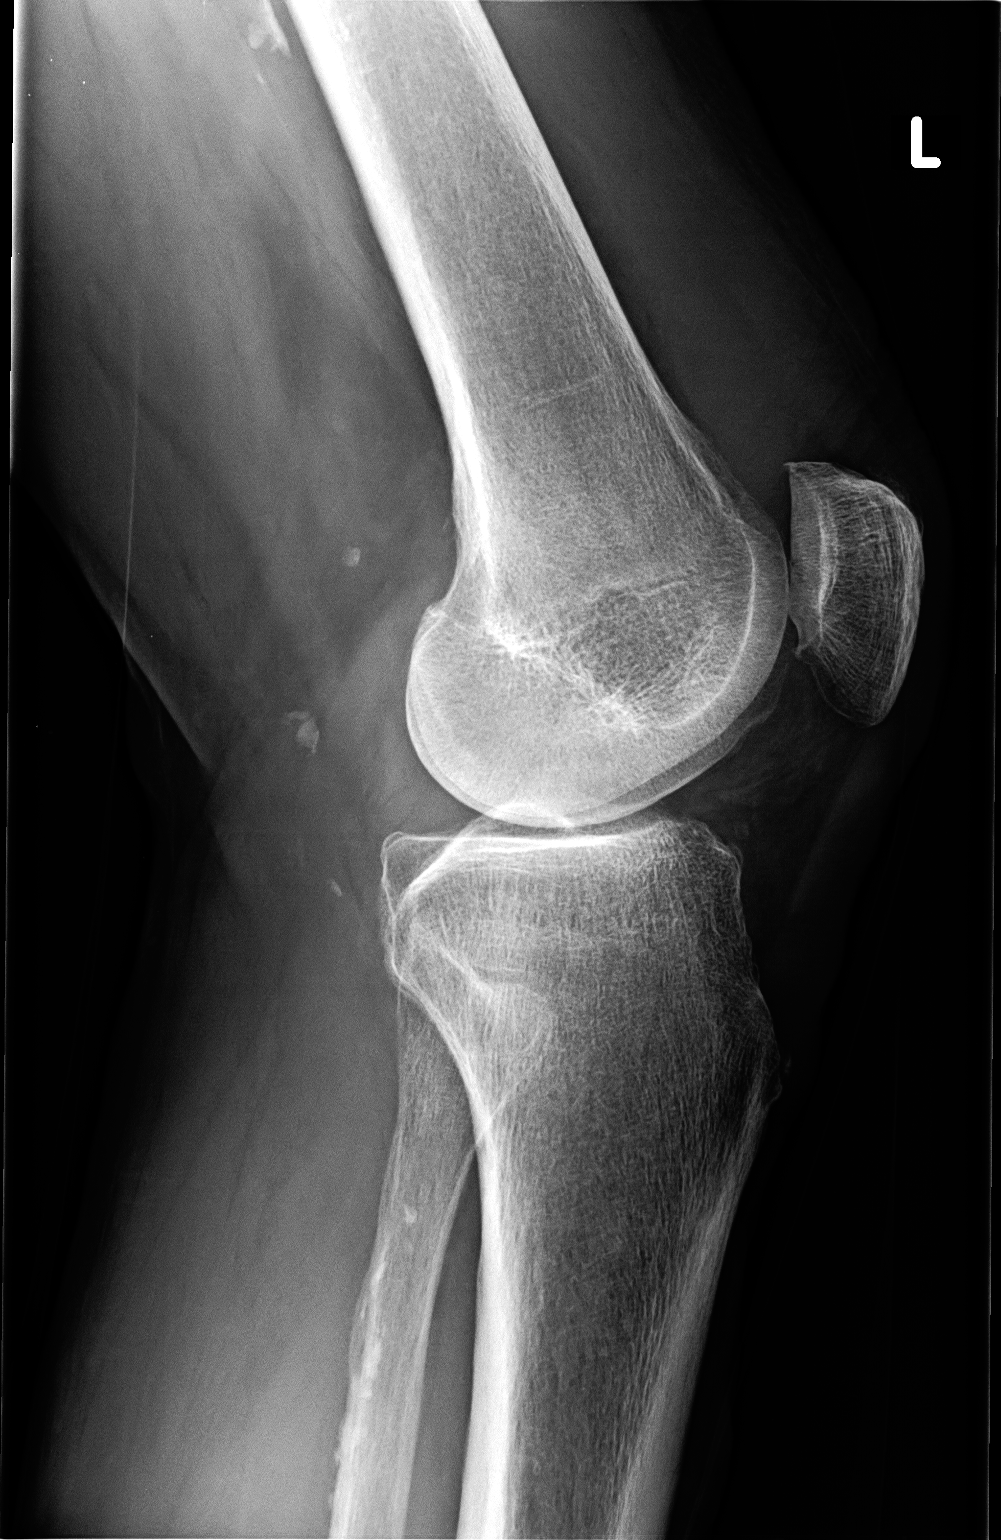

[2 of 2 positions shown; findings below may reference images not displayed]

FINDINGS: No fracture or dislocation. Small joint effusion. Mild degenerative
change of the knee, worse with the medial compartment and patellar
femoral joints with joint space loss, articular surface
irregularity, subchondral sclerosis and osteophytosis. No evidence
of chondrocalcinosis. Vascular calcifications. No radiopaque foreign
body.
IMPRESSION: 1. Small joint effusion.  Otherwise, no acute findings.
2. Mild degenerative change of the knee.

## 2016-05-26 ENCOUNTER — Other Ambulatory Visit: Payer: Self-pay | Admitting: Family Medicine

## 2016-05-30 ENCOUNTER — Ambulatory Visit (INDEPENDENT_AMBULATORY_CARE_PROVIDER_SITE_OTHER): Payer: Medicare Other | Admitting: Family Medicine

## 2016-05-30 ENCOUNTER — Encounter: Payer: Self-pay | Admitting: Family Medicine

## 2016-05-30 ENCOUNTER — Telehealth: Payer: Self-pay

## 2016-05-30 ENCOUNTER — Ambulatory Visit (INDEPENDENT_AMBULATORY_CARE_PROVIDER_SITE_OTHER): Payer: Medicare Other

## 2016-05-30 VITALS — BP 115/67 | HR 87 | Temp 97.1°F | Ht 70.5 in | Wt 160.0 lb

## 2016-05-30 DIAGNOSIS — J449 Chronic obstructive pulmonary disease, unspecified: Secondary | ICD-10-CM | POA: Diagnosis not present

## 2016-05-30 DIAGNOSIS — K219 Gastro-esophageal reflux disease without esophagitis: Secondary | ICD-10-CM

## 2016-05-30 DIAGNOSIS — I700 Atherosclerosis of aorta: Secondary | ICD-10-CM | POA: Diagnosis not present

## 2016-05-30 DIAGNOSIS — J988 Other specified respiratory disorders: Secondary | ICD-10-CM | POA: Diagnosis not present

## 2016-05-30 DIAGNOSIS — J479 Bronchiectasis, uncomplicated: Secondary | ICD-10-CM

## 2016-05-30 DIAGNOSIS — R197 Diarrhea, unspecified: Secondary | ICD-10-CM

## 2016-05-30 DIAGNOSIS — N400 Enlarged prostate without lower urinary tract symptoms: Secondary | ICD-10-CM

## 2016-05-30 DIAGNOSIS — R3912 Poor urinary stream: Secondary | ICD-10-CM | POA: Diagnosis not present

## 2016-05-30 DIAGNOSIS — E559 Vitamin D deficiency, unspecified: Secondary | ICD-10-CM

## 2016-05-30 DIAGNOSIS — R310 Gross hematuria: Secondary | ICD-10-CM | POA: Diagnosis not present

## 2016-05-30 DIAGNOSIS — N401 Enlarged prostate with lower urinary tract symptoms: Secondary | ICD-10-CM | POA: Diagnosis not present

## 2016-05-30 NOTE — Patient Instructions (Addendum)
Medicare Annual Wellness Visit  [facility] and the medical providers at [facility] strive to bring you the best medical care.  In doing so we not only want to address your current medical conditions and concerns but also to detect new conditions early and prevent illness, disease and health-related problems.    Medicare offers a yearly Wellness Visit which allows our clinical staff to assess your need for preventative services including immunizations, lifestyle education, counseling to decrease risk of preventable diseases and screening for fall risk and other medical concerns.    This visit is provided free of charge (no copay) for all Medicare recipients. The clinical pharmacists at [facility] have begun to conduct these Wellness Visits which will also include a thorough review of all your medications.    As you primary medical provider recommend that you make an appointment for your Annual Wellness Visit if you have not done so already this year.  You may set up this appointment before you leave today or you may call back [phone]) and schedule an appointment.  Please make sure when you call that you mention that you are scheduling your Annual Wellness Visit with the clinical pharmacist so that the appointment may be made for the proper length of time.     Continue current medications. Continue good therapeutic lifestyle changes which include good diet and exercise. Fall precautions discussed with patient. If an FOBT was given today- please return it to our front desk. If you are over 64 years old - you may need Prevnar 49 or the adult Pneumonia vaccine.  **Flu shots are available--- please call and schedule a FLU-CLINIC appointment**  After your visit with [place] today you will receive a survey in the mail or online from [employer] regarding your care with [place]. Please take a moment to fill this out. Your feedback is very  important to [place] as you can help [place] better understand your patient needs as well as improve your experience and satisfaction. WE CARE ABOUT YOU!!!   The patient will check with Dr. Earlean Shawl regarding the stool culture reports and let us know about that He will keep his follow-up appointments with the pulmonologist the gastroenterologist and the urologist We will talk to Dr. Ashok Cordia, his pulmonologist, or his assistant regarding the ongoing congestion that he continues to cough up with his history of bronchiectasis. In the meantime the patient should continue with his inhaler therapy, his Mucinex and drinking plenty of fluids. He should reduce his caffeine intake. He should drink plenty of fluids and stay well hydrated She keep the house as cool as possible and use a cool mist humidifier

## 2016-05-30 NOTE — Progress Notes (Signed)
Subjective:    Patient ID: Charles Golden, male    DOB: 09/14/1925, 83 y.o.   MRN: [id number]  HPI Pt here for follow up and management of chronic medical problems which includes GERD and Vit D Def. He is taking medications regularly.The patient continues to have cough and congestion. He saw the pulmonologist in November. He was given a prophylactic round of doxycycline at that time. He is also seeing the dentist and had an antibiotic for this. He is having loose stools again and this is been a problem he has had in the past and he is seeing the gastroenterologist for this. He was placed on some prednisone by the neurosurgeon and his leg is better. He has an appointment for follow-up with the urologist today. He will be given an FOBT to return and will get lab work here. The patient has multiple issues going on. He does have an appointment with Dr. Jeffie Pollock for follow-up today. He has had a couple of rounds of antibiotics as mentioned. One from the dentist and one from the pulmonologist. The stool started after this. He called his gastroenterologist and a stool culture was done but he does not have the results of the stool culture. He says his stools are more soupy than they are watery and sometimes reacts he has formed stools. He also notes drinking coffee with caffeine and we reminded him that this could cause some loose bowel movements. He is not seeing any blood in the stool. He denies any chest pain or pressure or shortness of breath anymore than usual other than the continued cough and congestion and coughing up yellow sputum. He has a raw feeling in his chest and unfortunately he did stop the ranitidine recommended by the pulmonologist to take at bedtime. He's passing his water without problems and no change in this. He does take a probiotic, align. He has upcoming appointments with pulmonology urology and gastroenterology. We will try to find out the results of the stool culture from the  gastroenterologist and we will try to talk to his pulmonologist to see what he would recommend as far as any antibiotics only if the stool culture was negative for C. difficile. While I was in the exam room during the patient's history he did cough up a tablespoon full of purulent yellow sputum.    Patient Active Problem List   Diagnosis Date Noted  . Acute bronchitis 04/11/2016  . Thoracic aortic atherosclerosis ([place]) 05/03/2015  . Upper airway cough syndrome 04/28/2015  . GERD (gastroesophageal reflux disease) 04/11/2015  . Tinea corporis 02/15/2015  . Acute medial meniscal tear 07/20/2014  . COPD GOLD I  12/08/2013  . Small bowel volvulus ([place]) 08/05/2013  . Chronic headache 08/05/2013  . Left inguinal hernia 08/05/2013  . Incisional hernia, without obstruction or gangrene 08/05/2013  . Bronchiectasis without acute exacerbation ([place]) 06/22/2013  . Headache 05/01/2013  . Obstructive chronic bronchitis without exacerbation ([place]) 04/30/2013  . Left hip pain 04/29/2013  . BPH (benign prostatic hyperplasia) 01/05/2013  . IBS (irritable bowel syndrome) 09/03/2012  . Chronic rhinosinusitis 09/03/2012  . Lung nodules 05/04/2003   Outpatient Encounter Prescriptions as of 05/30/2016  Medication Sig  . aspirin 81 MG chewable tablet Chew 81 mg by mouth daily.  Marland Kitchen azelastine (ASTELIN) 0.1 % nasal spray PLACE 1 SPRAY INTO THE NOSE 1 DAY OR 1 DOSE. USE IN EACH NOSTRIL AS DIRECTED  . Cholecalciferol (VITAMIN D3) 2000 UNITS TABS Take 1 tablet by mouth daily.  Marland Kitchen  Cyanocobalamin (VITAMIN B-12) 5000 MCG SUBL Place 1 tablet under the tongue daily.  Marland Kitchen doxazosin (CARDURA) 8 MG tablet TAKE 1/2 TO 1 TABLET DAILY  . finasteride (PROSCAR) 5 MG tablet Take 1 tablet by mouth daily.  . fluticasone (FLONASE) 50 MCG/ACT nasal spray 1 spray in each nostril qd  . guaiFENesin (MUCINEX) 600 MG 12 hr tablet Take 600 mg by mouth as needed. Reported on 05/25/2015  . latanoprost (XALATAN) 0.005 % ophthalmic solution  Place 1 drop into the left eye at bedtime.   . Probiotic Product (ALIGN) 4 MG CAPS Take 1 tablet by mouth daily.  . SBI/Protein Isolate (ENTERAGAM PO) Take by mouth.  . Tiotropium Bromide-Olodaterol (STIOLTO RESPIMAT) 2.5-2.5 MCG/ACT AERS Inhale 2 puffs into the lungs daily.  . [DISCONTINUED] Misc. Devices (ACAPELLA) MISC Use as directed  . [DISCONTINUED] ranitidine (ZANTAC) 150 MG tablet Take 1 tablet (150 mg total) by mouth at bedtime. (Patient taking differently: Take 150 mg by mouth 2 (two) times daily. )   No facility-administered encounter medications on file as of 05/30/2016.       Review of Systems  Constitutional: Negative.   HENT: Positive for congestion and voice change.   Eyes: Negative.   Respiratory: Positive for wheezing.   Cardiovascular: Negative.   Gastrointestinal: Positive for diarrhea.  Endocrine: Negative.   Genitourinary: Negative.   Musculoskeletal: Negative.   Skin: Negative.   Allergic/Immunologic: Negative.   Neurological: Negative.   Hematological: Negative.   Psychiatric/Behavioral: Negative.        Objective:   Physical Exam  Constitutional: He is oriented to person, place, and time. He appears well-developed and well-nourished. He appears distressed.  HENT:  Head: Normocephalic and atraumatic.  Right Ear: External ear normal.  Left Ear: External ear normal.  Nose: Nose normal.  Mouth/Throat: Oropharynx is clear and moist. No oropharyngeal exudate.  Eyes: Conjunctivae and EOM are normal. Pupils are equal, round, and reactive to light. Right eye exhibits no discharge. Left eye exhibits no discharge. No scleral icterus.  Neck: Normal range of motion. Neck supple. No thyromegaly present.  No anterior cervical adenopathy apparent and no thyromegaly.  Cardiovascular: Normal rate, regular rhythm, normal heart sounds and intact distal pulses.   No murmur heard. The heart has a regular rate and rhythm at 84/m  Pulmonary/Chest: Effort normal. No  respiratory distress. He has wheezes. He has no rales. He exhibits no tenderness.  There are rhonchi present and expiratory wheezes. The patient has to cough with taking a deep inspiration.  Abdominal: Soft. Bowel sounds are normal. He exhibits no mass. There is no tenderness. There is no rebound and no guarding.  No abdominal tenderness or masses  Genitourinary:  Genitourinary Comments: Patient sees Dr. Jeffie Pollock his urologist today.  Musculoskeletal: Normal range of motion. He exhibits no edema.  Lymphadenopathy:    He has no cervical adenopathy.  Neurological: He is alert and oriented to person, place, and time. He has normal reflexes. No cranial nerve deficit.  Skin: Skin is warm and dry. No rash noted.  Psychiatric: He has a normal mood and affect. His behavior is normal. Judgment and thought content normal.  Nursing note and vitals reviewed.  BP 115/67 (BP Location: Left Arm)   Pulse 87   Temp 97.1 F (36.2 C) (Oral)   Ht 5' 10.5" (1.791 m)   Wt 160 lb (72.6 kg)   BMI 22.63 kg/m   Chest x-ray with results pending===      Assessment & Plan:  1. Vitamin  D deficiency -Continue current treatment pending results of lab work - CBC with Differential/Platelet - VITAMIN D 25 Hydroxy (Vit-D Deficiency, Fractures)  2. Thoracic aortic atherosclerosis ([place]) -Continue aggressive therapeutic lifestyle changes - CBC with Differential/Platelet - BMP8+EGFR - Hepatic function panel - Lipid panel  3. Benign prostatic hyperplasia, unspecified whether lower urinary tract symptoms present -Follow-up with Dr. Jeffie Pollock as planned - CBC with Differential/Platelet  4. Gastroesophageal reflux disease, esophagitis presence not specified -Restart ranitidine 75 mg at bedtime - CBC with Differential/Platelet - Hepatic function panel  5. Bronchiectasis without complication ([place]) -Follow-up with pulmonology and have his condition with the pulmonologist regarding whether we should do any additional  antibiotics pending the cultures from the gastroenterologist.  6. Obstructive chronic bronchitis without exacerbation ([place]) -Follow-up with pulmonology  7. Diarrhea, unspecified type -The patient should refrain from the use of caffeine as much as possible -Continue to take the probiotic -Follow-up with gastroenterology as planned and get results of stool cultures that have been done recently  Meds ordered this encounter  Medications  . aspirin 81 MG chewable tablet    Sig: Chew 81 mg by mouth daily.  . SBI/Protein Isolate (ENTERAGAM PO)    Sig: Take by mouth.   Patient Instructions                       Medicare Annual Wellness Visit  [facility] and the medical providers at [facility] strive to bring you the best medical care.  In doing so we not only want to address your current medical conditions and concerns but also to detect new conditions early and prevent illness, disease and health-related problems.    Medicare offers a yearly Wellness Visit which allows our clinical staff to assess your need for preventative services including immunizations, lifestyle education, counseling to decrease risk of preventable diseases and screening for fall risk and other medical concerns.    This visit is provided free of charge (no copay) for all Medicare recipients. The clinical pharmacists at [facility] have begun to conduct these Wellness Visits which will also include a thorough review of all your medications.    As you primary medical provider recommend that you make an appointment for your Annual Wellness Visit if you have not done so already this year.  You may set up this appointment before you leave today or you may call back ([phone]) and schedule an appointment.  Please make sure when you call that you mention that you are scheduling your Annual Wellness Visit with the clinical pharmacist so that the appointment may be made for the proper  length of time.     Continue current medications. Continue good therapeutic lifestyle changes which include good diet and exercise. Fall precautions discussed with patient. If an FOBT was given today- please return it to our front desk. If you are over 53 years old - you may need Prevnar 86 or the adult Pneumonia vaccine.  **Flu shots are available--- please call and schedule a FLU-CLINIC appointment**  After your visit with [place] today you will receive a survey in the mail or online from [employer] regarding your care with [place]. Please take a moment to fill this out. Your feedback is very important to [place] as you can help [place] better understand your patient needs as well as improve your experience and satisfaction. WE CARE ABOUT YOU!!!   The patient will check with Dr. Earlean Shawl regarding the stool culture reports and  let us know about that He will keep his follow-up appointments with the pulmonologist the gastroenterologist and the urologist We will talk to Dr. Ashok Cordia, his pulmonologist, or his assistant regarding the ongoing congestion that he continues to cough up with his history of bronchiectasis. In the meantime the patient should continue with his inhaler therapy, his Mucinex and drinking plenty of fluids. He should reduce his caffeine intake. He should drink plenty of fluids and stay well hydrated She keep the house as cool as possible and use a cool mist humidifier  Arrie Senate MD

## 2016-05-30 NOTE — Telephone Encounter (Signed)
I too have left the pt a message to call our office and yours.

## 2016-05-30 NOTE — Telephone Encounter (Signed)
Dr. Laurance Flatten called and spoke with Tammy and wanted pt. To come in sooner than his appointment date, TP saw that on Friday at 11:45am there is a hold for a potential  patient. TP requested that we use that day if we can. Spoke with Daneil Dan and she stated that was fine, hold is still on because I was unable to reach pt., left vm to call back.

## 2016-05-31 ENCOUNTER — Encounter: Payer: Self-pay | Admitting: Pulmonary Disease

## 2016-05-31 ENCOUNTER — Telehealth: Payer: Self-pay | Admitting: Family Medicine

## 2016-05-31 ENCOUNTER — Ambulatory Visit (INDEPENDENT_AMBULATORY_CARE_PROVIDER_SITE_OTHER): Payer: Medicare Other | Admitting: Pulmonary Disease

## 2016-05-31 ENCOUNTER — Encounter: Payer: Self-pay | Admitting: Family Medicine

## 2016-05-31 DIAGNOSIS — J471 Bronchiectasis with (acute) exacerbation: Secondary | ICD-10-CM

## 2016-05-31 LAB — HEPATIC FUNCTION PANEL
ALK PHOS: 94 IU/L (ref 39–117)
ALT: 13 IU/L (ref 0–44)
AST: 13 IU/L (ref 0–40)
Albumin: 3.4 g/dL — ABNORMAL LOW (ref 3.5–4.7)
BILIRUBIN, DIRECT: 0.14 mg/dL (ref 0.00–0.40)
Bilirubin Total: 0.4 mg/dL (ref 0.0–1.2)
Total Protein: 6 g/dL (ref 6.0–8.5)

## 2016-05-31 LAB — CBC WITH DIFFERENTIAL/PLATELET
BASOS ABS: 0 10*3/uL (ref 0.0–0.2)
Basos: 0 %
EOS (ABSOLUTE): 0.2 10*3/uL (ref 0.0–0.4)
Eos: 2 %
HEMATOCRIT: 38.5 % (ref 37.5–51.0)
HEMOGLOBIN: 12.3 g/dL — AB (ref 13.0–17.7)
Immature Grans (Abs): 0 10*3/uL (ref 0.0–0.1)
Immature Granulocytes: 0 %
LYMPHS ABS: 1.3 10*3/uL (ref 0.7–3.1)
Lymphs: 15 %
MCH: 29.2 pg (ref 26.6–33.0)
MCHC: 31.9 g/dL (ref 31.5–35.7)
MCV: 91 fL (ref 79–97)
MONOS ABS: 0.9 10*3/uL (ref 0.1–0.9)
Monocytes: 10 %
NEUTROS ABS: 6.5 10*3/uL (ref 1.4–7.0)
Neutrophils: 73 %
Platelets: 267 10*3/uL (ref 150–379)
RBC: 4.21 x10E6/uL (ref 4.14–5.80)
RDW: 14.7 % (ref 12.3–15.4)
WBC: 8.9 10*3/uL (ref 3.4–10.8)

## 2016-05-31 LAB — LIPID PANEL
CHOLESTEROL TOTAL: 136 mg/dL (ref 100–199)
Chol/HDL Ratio: 2.5 ratio units (ref 0.0–5.0)
HDL: 54 mg/dL (ref >39)
LDL CALC: 68 mg/dL (ref 0–99)
Triglycerides: 70 mg/dL (ref 0–149)
VLDL Cholesterol Cal: 14 mg/dL (ref 5–40)

## 2016-05-31 LAB — BMP8+EGFR
BUN / CREAT RATIO: 10 (ref 10–24)
BUN: 13 mg/dL (ref 8–27)
CO2: 25 mmol/L (ref 18–29)
CREATININE: 1.3 mg/dL — AB (ref 0.76–1.27)
Calcium: 8.7 mg/dL (ref 8.6–10.2)
Chloride: 100 mmol/L (ref 96–106)
GFR calc Af Amer: 56 mL/min/{1.73_m2} — ABNORMAL LOW (ref >59)
GFR, EST NON AFRICAN AMERICAN: 49 mL/min/{1.73_m2} — AB (ref >59)
Glucose: 101 mg/dL — ABNORMAL HIGH (ref 65–99)
Potassium: 4.8 mmol/L (ref 3.5–5.2)
SODIUM: 141 mmol/L (ref 134–144)

## 2016-05-31 LAB — VITAMIN D 25 HYDROXY (VIT D DEFICIENCY, FRACTURES): VIT D 25 HYDROXY: 43.1 ng/mL (ref 30.0–100.0)

## 2016-05-31 MED ORDER — LEVOFLOXACIN 500 MG PO TABS: 500.0000 mg | ORAL_TABLET | Freq: Every day | ORAL | 0 refills | Status: DC

## 2016-05-31 MED ORDER — RANITIDINE HCL 150 MG PO TABS: 150.0000 mg | ORAL_TABLET | Freq: Every day | ORAL | 3 refills | Status: DC

## 2016-05-31 NOTE — Progress Notes (Signed)
Subjective:    Patient ID: Charles Golden, male    DOB: 04/19/1926, 83 y.o.   MRN: [id number]  [facility].:  Acute visit for Cough with known Chronic Bronchitis/Mild Bronchiectasis & GERD.  HPI  Cough:  Reports his cough is still producing mucus that is yellow & green since November. He reports in October he was on an antibiotic. He was on Doxycycline in November. He reports no improvement in his cough & chest congestion since completing it.   Chronic Bronchitis/Mild Bronchiectasis:  Patient utilizing Flutter valve for airway clearance. He admits he hasn't been using it twice daily but has been using it at least once daily. Denies any hemoptysis. He was started on Stiolto but that doesn't seemed to have helped his cough either.   GERD:  Started on Zantac 75mg  qhs at last appointment. He reports he didn't fill his prescription. He denies any morning brash water taste. No dyspepsia.   Review of Systems Reports he has ongoing diarrhea and is being checked by Dr. Lacey Jensen. No abdominal pain except with his coughing. He does have nausea with his cough. He has had chills but no sweats. He has subjective fever but none measured.   Allergies  Allergen Reactions  . Demerol [Meperidine] Other (See Comments)    Pt. States "he woke up during a colonoscopy"  . Meperidine Hcl Other (See Comments)    Unknown     Current Outpatient Prescriptions on File Prior to Visit  Medication Sig Dispense Refill  . aspirin 81 MG chewable tablet Chew 81 mg by mouth daily.    Marland Kitchen azelastine (ASTELIN) 0.1 % nasal spray PLACE 1 SPRAY INTO THE NOSE 1 DAY OR 1 DOSE. USE IN EACH NOSTRIL AS DIRECTED 30 mL 11  . Cholecalciferol (VITAMIN D3) 2000 UNITS TABS Take 1 tablet by mouth daily.    . Cyanocobalamin (VITAMIN B-12) 5000 MCG SUBL Place 1 tablet under the tongue daily.    Marland Kitchen doxazosin (CARDURA) 8 MG tablet TAKE 1/2 TO 1 TABLET DAILY 30 tablet 2  . finasteride (PROSCAR) 5 MG tablet Take 1 tablet by mouth daily.  10  . fluticasone  (FLONASE) 50 MCG/ACT nasal spray 1 spray in each nostril qd 16 g 11  . guaiFENesin (MUCINEX) 600 MG 12 hr tablet Take 600 mg by mouth as needed. Reported on 05/25/2015    . latanoprost (XALATAN) 0.005 % ophthalmic solution Place 1 drop into the left eye at bedtime.     . Probiotic Product (ALIGN) 4 MG CAPS Take 1 tablet by mouth daily.    . SBI/Protein Isolate (ENTERAGAM PO) Take by mouth.    . Tiotropium Bromide-Olodaterol (STIOLTO RESPIMAT) 2.5-2.5 MCG/ACT AERS Inhale 2 puffs into the lungs daily. 1 Inhaler 5   No current facility-administered medications on file prior to visit.     Past Medical History:  Diagnosis Date  . Bronchitis, chronic ([place])   . COPD (chronic obstructive pulmonary disease) ([place])   . Enlarged prostate   . GERD (gastroesophageal reflux disease)   . Glucagonoma   . Hernia, incisional    at present  . HOH (hard of hearing)   . IBS (irritable bowel syndrome)   . Sciatic pain   . Sinus congestion     Past Surgical History:  Procedure Laterality Date  . BOWEL RESECTION N/A 09/04/2012   Procedure: SMALL BOWEL RESECTION;  Surgeon: Donato Heinz, MD;  Location: [facility];  Service: General;  Laterality: N/A;  Anastimosis  . HEMORRHOID SURGERY    .  HERNIA REPAIR Right 70's  . INGUINAL HERNIA REPAIR Left 09/14/2013   Procedure: LEFT INGUINAL HERNIORRHAPHY;  Surgeon: Jamesetta So, MD;  Location: [facility];  Service: General;  Laterality: Left;  . INSERTION OF MESH Left 09/14/2013   Procedure: INSERTION OF MESH;  Surgeon: Jamesetta So, MD;  Location: [facility];  Service: General;  Laterality: Left;  . KNEE ARTHROSCOPY Left 07/21/2014   Procedure: LEFT KNEE ARTHROSCOPY WITH MEDIAL MENISCAL DEBRIDEMENT ;  Surgeon: Gearlean Alf, MD;  Location: [facility];  Service: Orthopedics;  Laterality: Left;  . LAPAROTOMY N/A 09/04/2012   Procedure: EXPLORATORY LAPAROTOMY;  Surgeon: Donato Heinz, MD;  Location: [facility];  Service: General;  Laterality: N/A;  . SKIN LESION EXCISION     Dr  Erik Obey  . TONSILLECTOMY      Family History  Problem Relation Age of Onset  . Heart disease Mother     Valve replacement and pacemaker  . Colon cancer Father   . Asthma      cousin    Social History   Social History  . Marital status: Married    Spouse name: [name]  . Number of children: 3  . Years of education: 12+   Occupational History  . Retired     Immunologist   Social History Main Topics  . Smoking status: Former Smoker    Packs/day: 1.00    Years: 30.00    Types: Cigarettes    Quit date: 05/28/2006  . Smokeless tobacco: Never Used  . Alcohol use 0.0 oz/week     Comment: Rare beer or wine.  . Drug use: No  . Sexual activity: Yes    Birth control/ protection: None   Other Topics Concern  . None   Social History Narrative   Patient lives at home with [name].    Patient has 3 children.    Patient has his BS   Patient is retired.    Drinks about 2 cups of coffee per day.      [facility]:   He is still married. 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 He is from [place].             Objective:   Physical Exam BP (!) 112/56 (BP Location: Left Arm, Cuff Size: Normal)   Pulse 82   Ht 5' 10.5" (1.791 m)   Wt 164 lb (74.4 kg)   SpO2 95%   BMI 23.20 kg/m  General:  Elderly male. Comfortable. No distress. Integument:  Warm & dry. No rash on exposed skin.  HEENT:  Moist mucus membranes. No nasal turbinate swelling. No scleral icterus. Cardiovascular:  Regular rate & rhythm. Normal S1 & S2. Pulmonary:  Coarse breath sounds bilaterally with some rhonchi. Normal work of breathing on room air. Speaking in complete sentences. Yellow mucus witnessed.  Abdomen: Soft. Normal bowel sounds. Nontender.  PFT 12/20/15: FVC 3.41 L (91%) FEV1 2.21 L (85%) FEV1/FVC 0.65 FEF 25-75 1.09 L (68%) negative bronchodilator response 07/13/15: FVC 3.63 L (96%) FEV1 2.40 L (92%)  FEV1/FVC 0.66 FEF 25-75 1.17 L (71%) positive bronchodilator response TLC 8.07 L (112%) RV 143% ERV 106% DLCO corrected 75%  06/22/13: FVC 3.74 L (89%) FEV1 2.39 L (77%) FEV1/FVC 0.64 FEF 25-75 1.17 L (46%)  6MWT 12/21/15:  Walked 360 meters / Baseline Sat 99% on RA / Nadir Sat 97% 07/13/15:  Walked 360 meters / Baseline Sat  97% on RA / Nadir Sat 97% on RA   IMAGING CXR PA/LAT 05/30/16 (personally reviewed by me):Hyperinflation with flattening of the diaphragms. No focal opacity appreciated. No pleural effusion. Heart normal in size & mediastinum normal in contour.  CT CHEST W/O 04/19/15 (previously reviewed by me): Mild centrilobular emphysematous changes. Subcentimeter peripheral left upper lobe nodules noted. Small hiatal hernia. No pericardial effusion. No pleural effusion or thickening. Calcified nodules within spleen noted.  CT CHEST W/O 05/04/03 (previously reviewed by me): Mild cylindrical bronchiectasis with linear opacity particularly in the right lower lobe suggestive of scar formation. Some hilar node calcification especially on the left. 2 separate subcentimeter nodules within the inferior segment of the lingula one of which is at least partially calcified. No pleural effusion or thickening appreciated. No pericardial effusion.  MICROBIOLOGY Sputum Ctx (04/13/16):  Oral Flora Sputum Ctx (11/09/15):  Oral Flora / Fungus negative / AFB negative Sputum Ctx (04/19/15): Oral Flora/AFB negative/Fungal negative  LABS 05/30/16 CBC:  8.9/12.3/38.5/267  04/11/15 Alpha-1 antitrypsin: MM (153) IgM: 31 IgA: 195 IgG: 822    Assessment & Plan:  83 y.o. male with a long history with chronic bronchitis/mild bronchiectasis & GERD. Patient continues to have productive cough and symptoms concerning for worsening of his exacerbation. I believe with the excessive volume and color change to mucus it's likely that he has an organism resistant to the previous doxycycline I had prescribed. I'm still  concerned that reflux could be contributing to his symptoms. I'm going to try the patient on an alternative antibiotic regimen while repeating cultures. I instructed the patient contact my office if he had any new breathing problems before his next appointment.  1. Chronic Bronchitis/Mild Bronchiectasis with Exacerbation:  Treat empirically with Levaquin 500 mg by mouth daily 7 days. Repeating sputum culture for AFB, fungus, and bacteria. 2. GERD:  Patient to start Zantac 75 mg by mouth daily at bedtime as previously prescribed. [facility]. 3. Health Maintenance:  S/P Influenza Vaccine October 2017, Pneumovax January 1999 & Prevnar July 2015. 4. Follow-up: Return to clinic on 1/11 as scheduled.  Sonia Baller Ashok Cordia, M.D. [facility] Pager:  [phone] After 3pm or if no response, call [phone] 12:27 PM 05/31/16

## 2016-05-31 NOTE — Telephone Encounter (Signed)
Pt called  - we will try and get in touch with Dr Earlean Shawl about stool culture

## 2016-05-31 NOTE — Patient Instructions (Signed)
   Remember your sputum specimen needs to be dropped off to the lab within 4 hours and should not be refrigerated.   I will see you next week as scheduled. Call me if you have any problems with your new antibiotic.  TESTS ORDERED: 1. Sputum Ctx AFB, Fungus, & Bacteria

## 2016-05-31 NOTE — Addendum Note (Signed)
Addended by: Virl Cagey on: 05/31/2016 01:09 PM   Modules accepted: Orders

## 2016-06-01 ENCOUNTER — Other Ambulatory Visit: Payer: Medicare Other

## 2016-06-01 ENCOUNTER — Other Ambulatory Visit: Payer: Self-pay | Admitting: *Deleted

## 2016-06-01 DIAGNOSIS — J471 Bronchiectasis with (acute) exacerbation: Secondary | ICD-10-CM

## 2016-06-01 MED ORDER — LATANOPROST 0.005 % OP SOLN: 1.0000 [drp] | Freq: Every day | OPHTHALMIC | 1 refills | Status: DC

## 2016-06-04 ENCOUNTER — Other Ambulatory Visit: Payer: Medicare Other

## 2016-06-04 DIAGNOSIS — Z1211 Encounter for screening for malignant neoplasm of colon: Secondary | ICD-10-CM | POA: Diagnosis not present

## 2016-06-04 LAB — RESPIRATORY CULTURE OR RESPIRATORY AND SPUTUM CULTURE: Organism ID, Bacteria: NORMAL

## 2016-06-05 ENCOUNTER — Other Ambulatory Visit: Payer: Self-pay | Admitting: *Deleted

## 2016-06-05 DIAGNOSIS — R195 Other fecal abnormalities: Secondary | ICD-10-CM

## 2016-06-05 LAB — FECAL OCCULT BLOOD, IMMUNOCHEMICAL: Fecal Occult Bld: POSITIVE — AB

## 2016-06-06 ENCOUNTER — Telehealth: Payer: Self-pay | Admitting: *Deleted

## 2016-06-06 NOTE — Telephone Encounter (Signed)
[facility] was able to pull report and c diff was neg - [facility] office should be contacting pt soon -- this just resulted yesterday 06/05/16.  Pt aware

## 2016-06-06 NOTE — Telephone Encounter (Signed)
Get stool culture for C. difficile

## 2016-06-06 NOTE — Telephone Encounter (Signed)
I spoke with the nurse from [facility] office and she states that she cant see any order or lab / result from a C-Diff.  She states that if we think we need one - we should go ahead and do it here   Dr Earlean Shawl is currently out of the country

## 2016-06-07 ENCOUNTER — Ambulatory Visit (INDEPENDENT_AMBULATORY_CARE_PROVIDER_SITE_OTHER): Payer: Medicare Other | Admitting: Pulmonary Disease

## 2016-06-07 ENCOUNTER — Encounter: Payer: Self-pay | Admitting: Pulmonary Disease

## 2016-06-07 VITALS — BP 120/78 | HR 78 | Ht 70.0 in | Wt 160.0 lb

## 2016-06-07 DIAGNOSIS — K219 Gastro-esophageal reflux disease without esophagitis: Secondary | ICD-10-CM

## 2016-06-07 DIAGNOSIS — H40051 Ocular hypertension, right eye: Secondary | ICD-10-CM | POA: Diagnosis not present

## 2016-06-07 DIAGNOSIS — Z961 Presence of intraocular lens: Secondary | ICD-10-CM | POA: Diagnosis not present

## 2016-06-07 DIAGNOSIS — H401122 Primary open-angle glaucoma, left eye, moderate stage: Secondary | ICD-10-CM | POA: Diagnosis not present

## 2016-06-07 DIAGNOSIS — H04123 Dry eye syndrome of bilateral lacrimal glands: Secondary | ICD-10-CM | POA: Diagnosis not present

## 2016-06-07 DIAGNOSIS — J479 Bronchiectasis, uncomplicated: Secondary | ICD-10-CM

## 2016-06-07 MED ORDER — ALBUTEROL SULFATE (2.5 MG/3ML) 0.083% IN NEBU: 2.5000 mg | INHALATION_SOLUTION | Freq: Four times a day (QID) | RESPIRATORY_TRACT | 3 refills | Status: DC | PRN

## 2016-06-07 MED ORDER — SODIUM CHLORIDE 3 % IN NEBU: INHALATION_SOLUTION | Freq: Two times a day (BID) | RESPIRATORY_TRACT | 3 refills | Status: DC

## 2016-06-07 NOTE — Addendum Note (Signed)
Addended by: Jannette Spanner on: 06/07/2016 02:15 PM   Modules accepted: Orders

## 2016-06-07 NOTE — Patient Instructions (Addendum)
   I am switching you over to a nebulizer regimen to replace your flutter valve.  Use the Albuterol in your nebulizer just prior to the 3% Saline twice daily.  Stop using the 3% Saline if you notice you are coughing up any blood.  You can try using Guaifenesin (the active ingredient in Mucinex) 600mg  twice daily in place of Mucinex.  Let me know if you have any problems or questions.  I will see you back in 4 weeks.

## 2016-06-07 NOTE — Addendum Note (Signed)
Addended by: Jannette Spanner on: 06/07/2016 02:23 PM   Modules accepted: Orders

## 2016-06-07 NOTE — Progress Notes (Signed)
Subjective:    Patient ID: Charles Golden, male    DOB: 1925-12-30, 83 y.o.   MRN: [id number]  C.C.:  Follow-up for Chronic Bronchitis/Mild Bronchiectasis & GERD.  HPI  Chronic Bronchitis/Mild Bronchiectasis:  Patient seen on 1/4 by me in our office for acute exacerbation. Treated with Levaquin for a 7 day course. He reports he is still producing a yellow mucus but the volume has decreased. He reports he is producing 1/4 the amount of mucus at most as before. No hemoptysis. He admits he is still using his flutter only once daily.   GERD:  Prescription for Zantac 75 mg sent to patient's pharmacy at last appointment. No reflux or dyspepsia since he started the medication.   Review of Systems  No fever or chills. He has continued to have night sweats. No chest tightness or pressure. He does report his watery diarrhea has worsened on the antibiotic. He is taking a Probiotic. Denies any abdominal pain or nausea.   Allergies  Allergen Reactions  . Demerol [Meperidine] Other (See Comments)    Pt. States "he woke up during a colonoscopy"  . Meperidine Hcl Other (See Comments)    Unknown     Current Outpatient Prescriptions on File Prior to Visit  Medication Sig Dispense Refill  . aspirin 81 MG chewable tablet Chew 81 mg by mouth daily.    Marland Kitchen azelastine (ASTELIN) 0.1 % nasal spray PLACE 1 SPRAY INTO THE NOSE 1 DAY OR 1 DOSE. USE IN EACH NOSTRIL AS DIRECTED 30 mL 11  . Cholecalciferol (VITAMIN D3) 2000 UNITS TABS Take 1 tablet by mouth daily.    . Cyanocobalamin (VITAMIN B-12) 5000 MCG SUBL Place 1 tablet under the tongue daily.    Marland Kitchen doxazosin (CARDURA) 8 MG tablet TAKE 1/2 TO 1 TABLET DAILY 30 tablet 2  . finasteride (PROSCAR) 5 MG tablet Take 1 tablet by mouth daily.  10  . fluticasone (FLONASE) 50 MCG/ACT nasal spray 1 spray in each nostril qd 16 g 11  . guaiFENesin (MUCINEX) 600 MG 12 hr tablet Take 600 mg by mouth as needed. Reported on 05/25/2015    . latanoprost (XALATAN) 0.005 %  ophthalmic solution Place 1 drop into the left eye at bedtime. 2.5 mL 1  . Probiotic Product (ALIGN) 4 MG CAPS Take 1 tablet by mouth daily.    . ranitidine (ZANTAC) 150 MG tablet Take 1 tablet (150 mg total) by mouth at bedtime. 30 tablet 3  . SBI/Protein Isolate (ENTERAGAM PO) Take by mouth.    . Tiotropium Bromide-Olodaterol (STIOLTO RESPIMAT) 2.5-2.5 MCG/ACT AERS Inhale 2 puffs into the lungs daily. 1 Inhaler 5   No current facility-administered medications on file prior to visit.     Past Medical History:  Diagnosis Date  . Bronchitis, chronic ([place])   . COPD (chronic obstructive pulmonary disease) ([place])   . Enlarged prostate   . GERD (gastroesophageal reflux disease)   . Glucagonoma   . Hernia, incisional    at present  . HOH (hard of hearing)   . IBS (irritable bowel syndrome)   . Sciatic pain   . Sinus congestion     Past Surgical History:  Procedure Laterality Date  . BOWEL RESECTION N/A 09/04/2012   Procedure: SMALL BOWEL RESECTION;  Surgeon: Donato Heinz, MD;  Location: [facility];  Service: General;  Laterality: N/A;  Anastimosis  . HEMORRHOID SURGERY    . HERNIA REPAIR Right 70's  . INGUINAL HERNIA REPAIR Left 09/14/2013   Procedure:  LEFT INGUINAL HERNIORRHAPHY;  Surgeon: Jamesetta So, MD;  Location: [facility];  Service: General;  Laterality: Left;  . INSERTION OF MESH Left 09/14/2013   Procedure: INSERTION OF MESH;  Surgeon: Jamesetta So, MD;  Location: [facility];  Service: General;  Laterality: Left;  . KNEE ARTHROSCOPY Left 07/21/2014   Procedure: LEFT KNEE ARTHROSCOPY WITH MEDIAL MENISCAL DEBRIDEMENT ;  Surgeon: Gearlean Alf, MD;  Location: [facility];  Service: Orthopedics;  Laterality: Left;  . LAPAROTOMY N/A 09/04/2012   Procedure: EXPLORATORY LAPAROTOMY;  Surgeon: Donato Heinz, MD;  Location: [facility];  Service: General;  Laterality: N/A;  . SKIN LESION EXCISION     [name]  . TONSILLECTOMY      Family History  Problem Relation Age of Onset  . Heart disease  Mother     Valve replacement and pacemaker  . Colon cancer Father   . Asthma      cousin    Social History   Social History  . Marital status: Married    Spouse name: [name]  . Number of children: 3  . Years of education: 12+   Occupational History  . Retired     Immunologist   Social History Main Topics  . Smoking status: Former Smoker    Packs/day: 1.00    Years: 30.00    Types: Cigarettes    Quit date: 05/28/2006  . Smokeless tobacco: Never Used  . Alcohol use 0.0 oz/week     Comment: Rare beer or wine.  . Drug use: No  . Sexual activity: Yes    Birth control/ protection: None   Other Topics Concern  . None   Social History Narrative   Patient lives at home with [name].    Patient has 3 children.    Patient has his BS   Patient is retired.    Drinks about 2 cups of coffee per day.      [facility]:   He is still married. 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 He is from [place].             Objective:   Physical Exam BP 120/78 (BP Location: Left Arm, Cuff Size: Normal)   Pulse 78   Ht 5\' 10"  (1.778 m)   Wt 160 lb (72.6 kg)   SpO2 97%   BMI 22.96 kg/m  General:  Elderly male. Comfortable. Well-dressed. Integument:  Warm & dry. No rash on exposed skin.  Extremities: No cyanosis or clubbing. HEENT:  Moist mucus membranes. Continues to have no nasal turbinate swelling. No oral ulcers. Cardiovascular:  Regular rate & rhythm. Normal S1 & S2. Pulmonary:  Overall clear with auscultation. No accessory muscle use on room air. Good aeration bilaterally.  Abdomen: Soft. Normal bowel sounds. Nontender.  PFT 12/20/15: FVC 3.41 L (91%) FEV1 2.21 L (85%) FEV1/FVC 0.65 FEF 25-75 1.09 L (68%) negative bronchodilator response 07/13/15: FVC 3.63 L (96%) FEV1 2.40 L (92%) FEV1/FVC 0.66 FEF 25-75 1.17 L (71%) positive bronchodilator response TLC 8.07 L (112%) RV 143% ERV 106%  DLCO corrected 75%  06/22/13: FVC 3.74 L (89%) FEV1 2.39 L (77%) FEV1/FVC 0.64 FEF 25-75 1.17 L (46%)  6MWT 12/21/15:  Walked 360 meters / Baseline Sat 99% on RA / Nadir Sat 97% 07/13/15:  Walked 360 meters / Baseline Sat 97% on RA / Nadir Sat 97% on RA   IMAGING CXR PA/LAT 05/30/16 (  previously reviewed by me):Hyperinflation with flattening of the diaphragms. No focal opacity appreciated. No pleural effusion. Heart normal in size & mediastinum normal in contour.  CT CHEST W/O 04/19/15 (previously reviewed by me): Mild centrilobular emphysematous changes. Subcentimeter peripheral left upper lobe nodules noted. Small hiatal hernia. No pericardial effusion. No pleural effusion or thickening. Calcified nodules within spleen noted.  CT CHEST W/O 05/04/03 (previously reviewed by me): Mild cylindrical bronchiectasis with linear opacity particularly in the right lower lobe suggestive of scar formation. Some hilar node calcification especially on the left. 2 separate subcentimeter nodules within the inferior segment of the lingula one of which is at least partially calcified. No pleural effusion or thickening appreciated. No pericardial effusion.  MICROBIOLOGY Sputum Ctx (06/01/16):  Oral Flora / Fungus pending / AFB pending Sputum Ctx (04/13/16):  Oral Flora /  Sputum Ctx (11/09/15):  Oral Flora / Fungus negative / AFB negative Sputum Ctx (04/19/15): Oral Flora/AFB negative/Fungal negative  LABS 05/30/16 CBC:  8.9/12.3/38.5/267  04/11/15 Alpha-1 antitrypsin: MM (153) IgM: 31 IgA: 195 IgG: 822    Assessment & Plan:  83 y.o. male with a long history with chronic bronchitis/mild bronchiectasis & GERD. Patient has by his own account approximately 75% improvement in his cough and based on physical exam a significant improvement in his previously noted abnormal breath sounds. His reflux seems to be well-controlled. I do believe she would benefit from consistent airway clearance but it also sounds as though  the flutter valve he currently has his suboptimally effective. As such, I am switching him over to a nebulizer regimen. I instructed the patient contact her office if he had any new breathing problems or questions before his next appointment.  1. Chronic Bronchitis/Mild Bronchiectasis: Awaiting finalization of AFB and fungal cultures from sputum on 1/5.Switching over to 3% hypertonic saline nebulized twice daily preceded by albuterol. 2. GERD:  Well controlled. Continuing Zantac daily at bedtime. 3. Health Maintenance:  S/P Influenza Vaccine October 2017, Pneumovax January 1999 & Prevnar July 2015. 4. Follow-up: Return to clinic in 4 weeks or sooner if needed.  Sonia Baller Ashok Cordia, M.D. [facility] Pager:  [phone] After 3pm or if no response, call [phone] 2:02 PM 06/07/16

## 2016-06-13 ENCOUNTER — Ambulatory Visit: Payer: Medicare Other | Admitting: Pulmonary Disease

## 2016-06-18 DIAGNOSIS — R197 Diarrhea, unspecified: Secondary | ICD-10-CM | POA: Diagnosis not present

## 2016-06-20 ENCOUNTER — Other Ambulatory Visit: Payer: Medicare Other

## 2016-06-20 DIAGNOSIS — R195 Other fecal abnormalities: Secondary | ICD-10-CM | POA: Diagnosis not present

## 2016-06-20 LAB — CBC WITH DIFFERENTIAL/PLATELET
BASOS ABS: 0 10*3/uL (ref 0.0–0.2)
BASOS: 0 %
EOS (ABSOLUTE): 0.2 10*3/uL (ref 0.0–0.4)
Eos: 2 %
HEMOGLOBIN: 12.1 g/dL — AB (ref 13.0–17.7)
Hematocrit: 37.3 % — ABNORMAL LOW (ref 37.5–51.0)
Immature Grans (Abs): 0 10*3/uL (ref 0.0–0.1)
Immature Granulocytes: 0 %
LYMPHS ABS: 2.6 10*3/uL (ref 0.7–3.1)
Lymphs: 37 %
MCH: 30 pg (ref 26.6–33.0)
MCHC: 32.4 g/dL (ref 31.5–35.7)
MCV: 93 fL (ref 79–97)
Monocytes Absolute: 0.8 10*3/uL (ref 0.1–0.9)
Monocytes: 11 %
NEUTROS ABS: 3.5 10*3/uL (ref 1.4–7.0)
Neutrophils: 50 %
Platelets: 281 10*3/uL (ref 150–379)
RBC: 4.03 x10E6/uL — ABNORMAL LOW (ref 4.14–5.80)
RDW: 14.4 % (ref 12.3–15.4)
WBC: 7.1 10*3/uL (ref 3.4–10.8)

## 2016-06-21 ENCOUNTER — Encounter: Payer: Self-pay | Admitting: Family Medicine

## 2016-06-21 ENCOUNTER — Encounter: Payer: Self-pay | Admitting: *Deleted

## 2016-06-21 ENCOUNTER — Other Ambulatory Visit: Payer: Medicare Other

## 2016-06-21 DIAGNOSIS — R195 Other fecal abnormalities: Secondary | ICD-10-CM | POA: Diagnosis not present

## 2016-06-22 LAB — FECAL OCCULT BLOOD, IMMUNOCHEMICAL: FECAL OCCULT BLD: NEGATIVE

## 2016-06-28 DIAGNOSIS — R195 Other fecal abnormalities: Secondary | ICD-10-CM | POA: Diagnosis not present

## 2016-06-28 DIAGNOSIS — R197 Diarrhea, unspecified: Secondary | ICD-10-CM | POA: Diagnosis not present

## 2016-06-28 DIAGNOSIS — Z86010 Personal history of colonic polyps: Secondary | ICD-10-CM | POA: Diagnosis not present

## 2016-06-28 DIAGNOSIS — Z800 Family history of malignant neoplasm of digestive organs: Secondary | ICD-10-CM | POA: Diagnosis not present

## 2016-06-28 DIAGNOSIS — K5289 Other specified noninfective gastroenteritis and colitis: Secondary | ICD-10-CM | POA: Diagnosis not present

## 2016-06-28 DIAGNOSIS — K648 Other hemorrhoids: Secondary | ICD-10-CM | POA: Diagnosis not present

## 2016-06-28 DIAGNOSIS — Z1211 Encounter for screening for malignant neoplasm of colon: Secondary | ICD-10-CM | POA: Diagnosis not present

## 2016-06-29 LAB — FUNGUS CULTURE W SMEAR

## 2016-07-05 ENCOUNTER — Encounter: Payer: Self-pay | Admitting: Pulmonary Disease

## 2016-07-05 ENCOUNTER — Ambulatory Visit (INDEPENDENT_AMBULATORY_CARE_PROVIDER_SITE_OTHER): Payer: Medicare Other | Admitting: Pulmonary Disease

## 2016-07-05 VITALS — BP 120/72 | HR 97 | Ht 70.0 in | Wt 156.4 lb

## 2016-07-05 DIAGNOSIS — K219 Gastro-esophageal reflux disease without esophagitis: Secondary | ICD-10-CM

## 2016-07-05 DIAGNOSIS — J479 Bronchiectasis, uncomplicated: Secondary | ICD-10-CM

## 2016-07-05 MED ORDER — FLUTTER DEVI: 1.0000 | 0 refills | Status: DC | PRN

## 2016-07-05 NOTE — Patient Instructions (Signed)
   Make sure that air is bubbling through the 3% saline solution.  Also make sure that you are using a total of 10mL with each dose.  You can try replacing one of your doses of 3% saline with your Flutter Valve to see if this helps keep out the mucus.

## 2016-07-05 NOTE — Progress Notes (Signed)
Patient seen in the office today and instructed on use of flutter device.  Patient expressed understanding and demonstrated technique. 

## 2016-07-05 NOTE — Addendum Note (Signed)
Addended by: Tyson Dense on: 07/05/2016 04:18 PM   Modules accepted: Orders

## 2016-07-05 NOTE — Progress Notes (Signed)
Subjective:    Patient ID: Charles Golden, male    DOB: [birth year], 83 y.o.   MRN: [id number]  C.C.:  Follow-up for Chronic Bronchitis/Mild Bronchiectasis & GERD.  HPI  Chronic Bronchitis/Mild Bronchiectasis:  Patient started on 3% hypertonic saline preceded by albuterol nebulized twice daily at last appointment for airway clearance. Cultures for AFB and fungus are pending. He continues to have intermittent coughing and production of mucus. He does feel his "chest congestion" has improved. Patient restarted his Spiriva Respimat a couple of days ago and feels it may have helped.   GERD:  Prescribed Zantac by mouth daily at bedtime. No reflux or dyspepsia.   Review of Systems  He is continuing to have watery diarrhea. Denies any abdominal pain or nausea. Does have a "chronic stomach ache". He does report significant fatigue & malaise. He denies any fever or chills. He has had significant weight loss.   Allergies  Allergen Reactions  . Demerol [Meperidine] Other (See Comments)    Pt. States "he woke up during a colonoscopy"  . Meperidine Hcl Other (See Comments)    Unknown     Current Outpatient Prescriptions on File Prior to Visit  Medication Sig Dispense Refill  . albuterol (PROVENTIL) (2.5 MG/3ML) 0.083% nebulizer solution Take 3 mLs (2.5 mg total) by nebulization every 6 (six) hours as needed for wheezing or shortness of breath. 75 mL 3  . azelastine (ASTELIN) 0.1 % nasal spray PLACE 1 SPRAY INTO THE NOSE 1 DAY OR 1 DOSE. USE IN EACH NOSTRIL AS DIRECTED 30 mL 11  . Cholecalciferol (VITAMIN D3) 2000 UNITS TABS Take 1 tablet by mouth daily.    . Cyanocobalamin (VITAMIN B-12) 5000 MCG SUBL Place 1 tablet under the tongue daily.    Marland Kitchen doxazosin (CARDURA) 8 MG tablet TAKE 1/2 TO 1 TABLET DAILY 30 tablet 2  . finasteride (PROSCAR) 5 MG tablet Take 1 tablet by mouth daily.  10  . fluticasone (FLONASE) 50 MCG/ACT nasal spray 1 spray in each nostril qd 16 g 11  . guaiFENesin (MUCINEX) 600 MG 12  hr tablet Take 600 mg by mouth as needed. Reported on 05/25/2015    . latanoprost (XALATAN) 0.005 % ophthalmic solution Place 1 drop into the left eye at bedtime. 2.5 mL 1  . Probiotic Product (ALIGN) 4 MG CAPS Take 1 tablet by mouth daily.    . ranitidine (ZANTAC) 150 MG tablet Take 1 tablet (150 mg total) by mouth at bedtime. 30 tablet 3  . sodium chloride HYPERTONIC 3 % nebulizer solution Take by nebulization 2 (two) times daily. 750 mL 3  . Tiotropium Bromide-Olodaterol (STIOLTO RESPIMAT) 2.5-2.5 MCG/ACT AERS Inhale 2 puffs into the lungs daily. 1 Inhaler 5  . aspirin 81 MG chewable tablet Chew 81 mg by mouth daily.    . SBI/Protein Isolate (ENTERAGAM PO) Take by mouth.     No current facility-administered medications on file prior to visit.     Past Medical History:  Diagnosis Date  . Bronchitis, chronic ([place])   . COPD (chronic obstructive pulmonary disease) ([place])   . Enlarged prostate   . GERD (gastroesophageal reflux disease)   . Glucagonoma   . Hernia, incisional    at present  . HOH (hard of hearing)   . IBS (irritable bowel syndrome)   . Sciatic pain   . Sinus congestion     Past Surgical History:  Procedure Laterality Date  . BOWEL RESECTION N/A 09/04/2012   Procedure: SMALL BOWEL RESECTION;  Surgeon: Donato Heinz, MD;  Location: [facility];  Service: General;  Laterality: N/A;  Anastimosis  . HEMORRHOID SURGERY    . HERNIA REPAIR Right 70's  . INGUINAL HERNIA REPAIR Left 09/14/2013   Procedure: LEFT INGUINAL HERNIORRHAPHY;  Surgeon: Jamesetta So, MD;  Location: [facility];  Service: General;  Laterality: Left;  . INSERTION OF MESH Left 09/14/2013   Procedure: INSERTION OF MESH;  Surgeon: Jamesetta So, MD;  Location: [facility];  Service: General;  Laterality: Left;  . KNEE ARTHROSCOPY Left 07/21/2014   Procedure: LEFT KNEE ARTHROSCOPY WITH MEDIAL MENISCAL DEBRIDEMENT ;  Surgeon: Gearlean Alf, MD;  Location: [facility];  Service: Orthopedics;  Laterality: Left;  . LAPAROTOMY  N/A 09/04/2012   Procedure: EXPLORATORY LAPAROTOMY;  Surgeon: Donato Heinz, MD;  Location: [facility];  Service: General;  Laterality: N/A;  . SKIN LESION EXCISION     [name]  . TONSILLECTOMY      Family History  Problem Relation Age of Onset  . Heart disease Mother     Valve replacement and pacemaker  . Colon cancer Father   . Asthma      cousin    Social History   Social History  . Marital status: Married    Spouse name: [name]  . Number of children: 3  . Years of education: 12+   Occupational History  . Retired     Immunologist   Social History Main Topics  . Smoking status: Former Smoker    Packs/day: 1.00    Years: 30.00    Types: Cigarettes    Quit date: 05/28/2006  . Smokeless tobacco: Never Used  . Alcohol use 0.0 oz/week     Comment: Rare beer or wine.  . Drug use: No  . Sexual activity: Yes    Birth control/ protection: None   Other Topics Concern  . None   Social History Narrative   Patient lives at home with [name].    Patient has 3 children.    Patient has his BS   Patient is retired.    Drinks about 2 cups of coffee per day.      [facility]:   He is still married. 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 He is from [place].             Objective:   Physical Exam BP 120/72 (BP Location: Right Arm, Patient Position: Sitting, Cuff Size: Normal)   Pulse 97   Ht 5\' 10"  (1.778 m)   Wt 156 lb 6.4 oz (70.9 kg)   SpO2 (!) 66%   BMI 22.44 kg/m   General:  Awake. Alert. Thin Caucasian male.  Integument:  Warm & dry. No rash on exposed skin.  Extremities:  No cyanosis or clubbing.  HEENT:  Moist mucus membranes. No nasal turbinate swelling. No scleral icterus. Cardiovascular:  Regular rate. No edema. Normal S1 & S2. Pulmonary:  Coarse rhonchorous breath sounds bilaterally. No accessory muscle use on room air. Intermittent coughing noted. Abdomen:  Soft. Normal bowel sounds. Nondistended.  Musculoskeletal:  Normal bulk and tone. No joint deformity or effusion appreciated.   PFT 12/20/15: FVC 3.41 L (91%) FEV1 2.21 L (85%) FEV1/FVC 0.65 FEF 25-75 1.09 L (68%) negative bronchodilator response 07/13/15: FVC 3.63 L (96%) FEV1 2.40 L (92%) FEV1/FVC 0.66 FEF 25-75 1.17 L (71%) positive bronchodilator response TLC 8.07 L (112%) RV 143% ERV  106% DLCO corrected 75%  06/22/13: FVC 3.74 L (89%) FEV1 2.39 L (77%) FEV1/FVC 0.64 FEF 25-75 1.17 L (46%)  6MWT 12/21/15:  Walked 360 meters / Baseline Sat 99% on RA / Nadir Sat 97% 07/13/15:  Walked 360 meters / Baseline Sat 97% on RA / Nadir Sat 97% on RA   IMAGING CXR PA/LAT 05/30/16 (previously reviewed by me):Hyperinflation with flattening of the diaphragms. No focal opacity appreciated. No pleural effusion. Heart normal in size & mediastinum normal in contour.  CT CHEST W/O 04/19/15 (previously reviewed by me): Mild centrilobular emphysematous changes. Subcentimeter peripheral left upper lobe nodules noted. Small hiatal hernia. No pericardial effusion. No pleural effusion or thickening. Calcified nodules within spleen noted.  CT CHEST W/O 05/04/03 (previously reviewed by me): Mild cylindrical bronchiectasis with linear opacity particularly in the right lower lobe suggestive of scar formation. Some hilar node calcification especially on the left. 2 separate subcentimeter nodules within the inferior segment of the lingula one of which is at least partially calcified. No pleural effusion or thickening appreciated. No pericardial effusion.  MICROBIOLOGY Sputum Ctx (06/01/16):  Oral Flora / Fungus pending / AFB pending Sputum Ctx (04/13/16):  Oral Flora /  Sputum Ctx (11/09/15):  Oral Flora / Fungus negative / AFB negative Sputum Ctx (04/19/15): Oral Flora/AFB negative/Fungal negative  LABS 05/30/16 CBC:  8.9/12.3/38.5/267  04/11/15 Alpha-1 antitrypsin: MM (153) IgM: 31 IgA: 195 IgG: 822    Assessment &  Plan:  83 y.o. Male with history of chronic bronchitis/mild bronchiectasis and GERD. With continued symptomatic improvement I'm hesitant to adjust his regimen but he will attempt to replace his hypertonic saline with a flutter valve once daily. I counseled the patient on appropriate hypertonic saline and hookup. His reflux seems to be well-controlled at this time but with ongoing diarrhea this is his main complaint. I instructed the patient contact my office if his breathing or cough worsened in anyway.  1. Chronic Bronchitis/Mild Bronchiectasis:  Continuing on Spiriva Respimat. Continuing 3% saline at least once daily and attempting to replace one dose with his Flutter Valve. Awaiting finalization of his cultures. 2. GERD:  Continuing Zantac.  3. Health Maintenance:  S/P Influenza Vaccine October 2017, Pneumovax January 1999 & Prevnar July 2015. 4. Follow-up: Return to clinic in 3 months or sooner if needed.  Sonia Baller Ashok Cordia, M.D. [facility] Pager:  [phone] After 3pm or if no response, call [phone] 3:32 PM 07/05/16

## 2016-07-18 ENCOUNTER — Other Ambulatory Visit: Payer: Self-pay | Admitting: Family Medicine

## 2016-07-19 LAB — AFB CULTURE WITH SMEAR (NOT AT ARMC)

## 2016-07-24 DIAGNOSIS — K52831 Collagenous colitis: Secondary | ICD-10-CM

## 2016-07-24 HISTORY — DX: Collagenous colitis: K52.831

## 2016-07-28 ENCOUNTER — Ambulatory Visit (INDEPENDENT_AMBULATORY_CARE_PROVIDER_SITE_OTHER): Payer: Medicare Other | Admitting: Pediatrics

## 2016-07-28 ENCOUNTER — Encounter: Payer: Self-pay | Admitting: Pediatrics

## 2016-07-28 VITALS — BP 132/74 | HR 61 | Temp 97.6°F | Ht 70.5 in | Wt 161.2 lb

## 2016-07-28 DIAGNOSIS — R030 Elevated blood-pressure reading, without diagnosis of hypertension: Secondary | ICD-10-CM | POA: Diagnosis not present

## 2016-07-28 NOTE — Progress Notes (Signed)
  Subjective:   Patient ID: Charles Golden, male    DOB: 10-23-1925, 83 y.o.   MRN: [id number] CC: Hypertension (saw [facility] Tues BP ok. took at home Thurs then this morning. readings were  161/77 59 & 158/80 64 started new Rx budisonide) and Night Sweats  HPI: Charles Golden is a 83 y.o. male presenting for Hypertension (saw [facility] Tues BP ok. took at home Thurs then this morning. readings were  161/77 59 & 158/80 64 started new Rx budisonide) and Night Sweats  Loose stools until started on budesonide on 2/21 Has been helping with diarrhea Following with GI BP elevated to 149/80 at recent office visit Checked yesterday at home, SBP was in 160s Has never had elevated BPs before Takes doxazosin for his prostate, not on other BP meds Denies change in usual HA, no blurry vision No CP, no SOB No lightheadedness/dizziness when he stands up  Night sweats every night for the last week since starting the budesonide  Relevant past medical, surgical, family and social history reviewed. Allergies and medications reviewed and updated. History  Smoking Status  . Former Smoker  . Packs/day: 1.00  . Years: 30.00  . Types: Cigarettes  . Quit date: 05/28/2006  Smokeless Tobacco  . Never Used   ROS: Per HPI   Objective:    BP 132/74   Pulse 61   Temp 97.6 F (36.4 C) (Oral)   Ht 5' 10.5" (1.791 m)   Wt 161 lb 3.2 oz (73.1 kg)   BMI 22.80 kg/m   Wt Readings from Last 3 Encounters:  07/28/16 161 lb 3.2 oz (73.1 kg)  07/05/16 156 lb 6.4 oz (70.9 kg)  06/07/16 160 lb (72.6 kg)    Gen: NAD, alert, cooperative with exam, NCAT EYES: EOMI, no conjunctival injection, or no icterus ENT:  OP without erythema CV: NRRR, normal S1/S2, no murmur Resp: moving air well, slightly coarse breath sounds, few scattered rhonchi that clear with deep breaths. no wheezes, normal WOB Abd: +BS, soft, NTND. Ext: No edema, warm Neuro: Alert and oriented MSK: normal muscle bulk  Assessment & Plan:  [place] was  seen today for hypertension and night sweats.  Diagnoses and all orders for this visit:  Elevated blood pressure reading Recently started on budesonide Could be making BPs elevated, has had elevated readings x 2 days, doesn't usually check Initially elevated in clinic, improved to 130s/70s with recheck Check blood pressures at home twice a day with your feet flat on the floor, sitting comfortably, when you are feeling calm. Send me the numbers you get after a week Avoid caffeine, don't take any over the counter cough or cold medicines Don't add salt to food, try to stay under 3000mg  in a day of sodium  Follow up plan: Return in about 4 weeks (around 08/25/2016) for blood pressure follow up. Assunta Found, MD [place]

## 2016-07-28 NOTE — Patient Instructions (Addendum)
Check blood pressures at home twice a day with your feet flat on the floor, sitting comfortably, when you are feeling calm. Send me the numbers you get after a week Avoid caffeine, don't take any over the counter cough or cold medicines Don't add salt to food, try to stay under 3000mg  in a day of sodium

## 2016-08-03 ENCOUNTER — Other Ambulatory Visit: Payer: Self-pay | Admitting: *Deleted

## 2016-08-03 ENCOUNTER — Encounter: Payer: Self-pay | Admitting: Pediatrics

## 2016-08-03 MED ORDER — AMLODIPINE BESYLATE 5 MG PO TABS: 5.0000 mg | ORAL_TABLET | Freq: Every day | ORAL | 1 refills | Status: DC

## 2016-08-04 ENCOUNTER — Encounter: Payer: Self-pay | Admitting: Pediatrics

## 2016-08-09 DIAGNOSIS — L570 Actinic keratosis: Secondary | ICD-10-CM | POA: Diagnosis not present

## 2016-08-09 DIAGNOSIS — D225 Melanocytic nevi of trunk: Secondary | ICD-10-CM | POA: Diagnosis not present

## 2016-08-09 DIAGNOSIS — L821 Other seborrheic keratosis: Secondary | ICD-10-CM | POA: Diagnosis not present

## 2016-08-09 DIAGNOSIS — L814 Other melanin hyperpigmentation: Secondary | ICD-10-CM | POA: Diagnosis not present

## 2016-08-27 ENCOUNTER — Ambulatory Visit (INDEPENDENT_AMBULATORY_CARE_PROVIDER_SITE_OTHER): Payer: Medicare Other | Admitting: Pediatrics

## 2016-08-27 ENCOUNTER — Encounter: Payer: Self-pay | Admitting: Pediatrics

## 2016-08-27 VITALS — BP 114/68 | HR 98 | Temp 98.2°F | Ht 70.5 in | Wt 158.0 lb

## 2016-08-27 DIAGNOSIS — K4020 Bilateral inguinal hernia, without obstruction or gangrene, not specified as recurrent: Secondary | ICD-10-CM

## 2016-08-27 DIAGNOSIS — R195 Other fecal abnormalities: Secondary | ICD-10-CM | POA: Diagnosis not present

## 2016-08-27 DIAGNOSIS — R030 Elevated blood-pressure reading, without diagnosis of hypertension: Secondary | ICD-10-CM | POA: Diagnosis not present

## 2016-08-27 NOTE — Progress Notes (Signed)
  Subjective:   Patient ID: Charles Golden, male    DOB: 1926-03-15, 83 y.o.   MRN: [id number] CC: Follow-up (Blood Pressure)  HPI: Charles Golden is a 83 y.o. male presenting for Follow-up (Blood Pressure)  HTN: taking BPs at home 110s-130s since stopping PO steroid  Started budesonide 2/21 for collagenous colitis, diarrhea stopped 2/23 Having formed stools for a week Seen in clinic 3/2 for elevated BP Stopped budesonide 3/8 Now some days with loose stools, some days with formed Never more than 2 stools Was prescribed amlodipine for HTN, BPs improved with stop of steroids so never started the amlodipine  Checks BPs several times throughout the day Sometimes noticing heart beating, does not think racing No chest pressure or pain  Seen by cardiology 05/2015 for coronary calcifications No chest pain  Has b/l inguinal hernias, easily compressible, nontender  Relevant past medical, surgical, family and social history reviewed. Allergies and medications reviewed and updated. History  Smoking Status  . Former Smoker  . Packs/day: 1.00  . Years: 30.00  . Types: Cigarettes  . Quit date: 05/28/2006  Smokeless Tobacco  . Never Used   ROS: Per HPI   Objective:    BP 114/68   Pulse 98   Temp 98.2 F (36.8 C) (Oral)   Ht 5' 10.5" (1.791 m)   Wt 158 lb (71.7 kg)   BMI 22.35 kg/m   Wt Readings from Last 3 Encounters:  08/27/16 158 lb (71.7 kg)  07/28/16 161 lb 3.2 oz (73.1 kg)  07/05/16 156 lb 6.4 oz (70.9 kg)    Gen: NAD, alert, cooperative with exam, NCAT EYES: EOMI, no conjunctival injection, or no icterus LYMPH: no cervical LAD CV: NRRR, normal S1/S2, no murmur, distal pulses 2+ b/l Resp: CTABL, no wheezes, normal WOB Abd: +BS, soft, NTND. no guarding or organomegaly, midline hernia, easily compressible Ext: No edema, warm Neuro: Alert and oriented, strength equal b/l UE and LE, coordination grossly normal MSK: normal muscle bulk  Assessment & Plan:  Charles Golden was seen  today for follow-up multiple med problems. Diagnoses and all orders for this visit:  Elevated blood pressure reading BPs at home within normal range Not on any bp meds now Likely busonide that started elevated nyumbers If restarts steroid may need 2.5mg -5mg  daily amlodipine  Bilateral inguinal hernia without obstruction or gangrene, recurrence not specified Easily compressible per pt, no pain Discussed return precautions  Loose stools Following up with GI later this month Thinking about restarting budesonide for ongoing symptoms Will watch BPs at home, let us know if elevated  Follow up plan: Return if symptoms worsen or fail to improve. Assunta Found, MD [place]

## 2016-09-04 DIAGNOSIS — K52831 Collagenous colitis: Secondary | ICD-10-CM | POA: Diagnosis not present

## 2016-09-30 ENCOUNTER — Other Ambulatory Visit: Payer: Self-pay | Admitting: Family Medicine

## 2016-10-08 ENCOUNTER — Encounter: Payer: Self-pay | Admitting: Pulmonary Disease

## 2016-10-08 ENCOUNTER — Ambulatory Visit (INDEPENDENT_AMBULATORY_CARE_PROVIDER_SITE_OTHER): Payer: Medicare Other | Admitting: Pulmonary Disease

## 2016-10-08 VITALS — BP 110/70 | HR 78 | Ht 70.5 in | Wt 159.2 lb

## 2016-10-08 DIAGNOSIS — K219 Gastro-esophageal reflux disease without esophagitis: Secondary | ICD-10-CM

## 2016-10-08 DIAGNOSIS — J479 Bronchiectasis, uncomplicated: Secondary | ICD-10-CM

## 2016-10-08 DIAGNOSIS — R5383 Other fatigue: Secondary | ICD-10-CM

## 2016-10-08 DIAGNOSIS — J42 Unspecified chronic bronchitis: Secondary | ICD-10-CM | POA: Diagnosis not present

## 2016-10-08 NOTE — Progress Notes (Signed)
Subjective:    Patient ID: Charles Golden, male    DOB: August 26, 1925, 83 y.o.   MRN: [id number]  C.C.:  Follow-up for Chronic Bronchitis/Mild Bronchiectasis & GERD.  HPI  Chronic bronchitis/mild bronchiectasis: Previously poor control with use of flutter valve for airway clearance. Prescribed 3% hypertonic saline preceded with albuterol nebulized twice daily. Also prescribed Spiriva Respimat. Reports his cough is now producing a clear mucus. He reports his baseline dyspnea. He is having some urination problems with his Spiriva. He admits he doesn't use his nebulizer medications as faithfully as he should. He is using his flutter valve. No exacerbations since last appointment.   GERD: Currently prescribed Zantac. He reports reflux once weekly. He stopped using Zantac. He uses Rolaids intermittently.   Review of Systems  He reports his diarrhea has resolved after he started taking Budesonide. He denies any abdominal pain. He reports infrequent cramping. No fever or chills. He is still having night sweats. He does still having significant fatigue and wakes up "tired". He does have some mild near syncope with standing up quickly that is brief. He denies any snoring. He does have morning headaches intermittently. He denies any witnesned apneas.   Allergies  Allergen Reactions  . Demerol [Meperidine] Other (See Comments)    Pt. States "he woke up during a colonoscopy"  . Meperidine Hcl Other (See Comments)    Unknown     Current Outpatient Prescriptions on File Prior to Visit  Medication Sig Dispense Refill  . albuterol (PROVENTIL) (2.5 MG/3ML) 0.083% nebulizer solution Take 3 mLs (2.5 mg total) by nebulization every 6 (six) hours as needed for wheezing or shortness of breath. 75 mL 3  . aspirin EC 81 MG tablet Take 81 mg by mouth daily.    Marland Kitchen azelastine (ASTELIN) 0.1 % nasal spray PLACE 1 SPRAY INTO THE NOSE 1 DAY OR 1 DOSE. USE IN EACH NOSTRIL AS DIRECTED 30 mL 11  . Cholecalciferol (VITAMIN D3)  2000 UNITS TABS Take 1 tablet by mouth daily.    . Cyanocobalamin (VITAMIN B-12) 5000 MCG SUBL Place 1 tablet under the tongue daily.    Marland Kitchen doxazosin (CARDURA) 8 MG tablet TAKE 1/2 TO 1 TABLET BY MOUTH DAILY 30 tablet 4  . finasteride (PROSCAR) 5 MG tablet Take 1 tablet by mouth daily.  10  . fluticasone (FLONASE) 50 MCG/ACT nasal spray 1 spray in each nostril qd 16 g 11  . guaiFENesin (MUCINEX) 600 MG 12 hr tablet Take 600 mg by mouth as needed. Reported on 05/25/2015    . latanoprost (XALATAN) 0.005 % ophthalmic solution Place 1 drop into the left eye at bedtime. 2.5 mL 1  . Probiotic Product (ALIGN) 4 MG CAPS Take 1 tablet by mouth daily.    Marland Kitchen Respiratory Therapy Supplies (FLUTTER) DEVI 1 Device by Other route as needed. 1 each 0  . sodium chloride HYPERTONIC 3 % nebulizer solution Take by nebulization 2 (two) times daily. 750 mL 3  . Tiotropium Bromide-Olodaterol (STIOLTO RESPIMAT) 2.5-2.5 MCG/ACT AERS Inhale 2 puffs into the lungs daily. 1 Inhaler 5  . SBI/Protein Isolate (ENTERAGAM PO) Take by mouth.     No current facility-administered medications on file prior to visit.     Past Medical History:  Diagnosis Date  . Bronchitis, chronic ([place])   . COPD (chronic obstructive pulmonary disease) ([place])   . Enlarged prostate   . GERD (gastroesophageal reflux disease)   . Glucagonoma   . Hernia, incisional    at present  .  HOH (hard of hearing)   . IBS (irritable bowel syndrome)   . Sciatic pain   . Sinus congestion     Past Surgical History:  Procedure Laterality Date  . BOWEL RESECTION N/A 09/04/2012   Procedure: SMALL BOWEL RESECTION;  Surgeon: Donato Heinz, MD;  Location: [facility];  Service: General;  Laterality: N/A;  Anastimosis  . HEMORRHOID SURGERY    . HERNIA REPAIR Right 70's  . INGUINAL HERNIA REPAIR Left 09/14/2013   Procedure: LEFT INGUINAL HERNIORRHAPHY;  Surgeon: Jamesetta So, MD;  Location: [facility];  Service: General;  Laterality: Left;  . INSERTION OF MESH Left  09/14/2013   Procedure: INSERTION OF MESH;  Surgeon: Jamesetta So, MD;  Location: [facility];  Service: General;  Laterality: Left;  . KNEE ARTHROSCOPY Left 07/21/2014   Procedure: LEFT KNEE ARTHROSCOPY WITH MEDIAL MENISCAL DEBRIDEMENT ;  Surgeon: Gearlean Alf, MD;  Location: [facility];  Service: Orthopedics;  Laterality: Left;  . LAPAROTOMY N/A 09/04/2012   Procedure: EXPLORATORY LAPAROTOMY;  Surgeon: Donato Heinz, MD;  Location: [facility];  Service: General;  Laterality: N/A;  . SKIN LESION EXCISION     Dr Charles Golden  . TONSILLECTOMY      Family History  Problem Relation Age of Onset  . Heart disease Mother        Valve replacement and pacemaker  . Colon cancer Father   . Asthma Unknown        cousin    Social History   Social History  . Marital status: Married    Spouse name: Charles Golden  . Number of children: 3  . Years of education: 12+   Occupational History  . Retired     Immunologist   Social History Main Topics  . Smoking status: Former Smoker    Packs/day: 1.00    Years: 30.00    Types: Cigarettes    Quit date: 05/28/2006  . Smokeless tobacco: Never Used  . Alcohol use 0.0 oz/week     Comment: Rare beer or wine.  . Drug use: No  . Sexual activity: Yes    Birth control/ protection: None   Other Topics Concern  . None   Social History Narrative   Patient lives at home with [name].    Patient has 3 children.    Patient has his BS   Patient is retired.    Drinks about 2 cups of coffee per day.      [facility]:   He is still married. 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 He is from [place].             Objective:   Physical Exam BP 110/70 (BP Location: Right Arm, Patient Position: Sitting, Cuff Size: Normal)   Pulse 78   Ht 5' 10.5" (1.791 m)   Wt 159 lb 3.2 oz (72.2 kg)   SpO2 98%   BMI 22.52 kg/m   Gen.: Elderly male. No distress. Comfortable. Pulmonary:  Clear bilaterally with auscultation. No accessory muscle use on room air. HEENT: Moist mucous membranes. No oral ulcers. No scleral icterus. Cardiac: Regular rate. No edema. No appreciable JVD. Abdomen: Soft. Nondistended. Normal bowel sounds.  PFT 12/20/15: FVC 3.41 L (91%) FEV1 2.21 L (85%) FEV1/FVC 0.65 FEF 25-75 1.09 L (68%) negative bronchodilator response 07/13/15: FVC 3.63 L (96%) FEV1 2.40 L (92%) FEV1/FVC 0.66 FEF 25-75 1.17 L (71%) positive bronchodilator response  TLC 8.07 L (112%) RV 143% ERV 106% DLCO corrected 75%  06/22/13: FVC 3.74 L (89%) FEV1 2.39 L (77%) FEV1/FVC 0.64 FEF 25-75 1.17 L (46%)  6MWT 12/21/15:  Walked 360 meters / Baseline Sat 99% on RA / Nadir Sat 97% 07/13/15:  Walked 360 meters / Baseline Sat 97% on RA / Nadir Sat 97% on RA   IMAGING CXR PA/LAT 05/30/16 (previously reviewed by me):Hyperinflation with flattening of the diaphragms. No focal opacity appreciated. No pleural effusion. Heart normal in size & mediastinum normal in contour.  CT CHEST W/O 04/19/15 (previously reviewed by me): Mild centrilobular emphysematous changes. Subcentimeter peripheral left upper lobe nodules noted. Small hiatal hernia. No pericardial effusion. No pleural effusion or thickening. Calcified nodules within spleen noted.  CT CHEST W/O 05/04/03 (previously reviewed by me): Mild cylindrical bronchiectasis with linear opacity particularly in the right lower lobe suggestive of scar formation. Some hilar node calcification especially on the left. 2 separate subcentimeter nodules within the inferior segment of the lingula one of which is at least partially calcified. No pleural effusion or thickening appreciated. No pericardial effusion.  MICROBIOLOGY Sputum Ctx (06/01/16):  Oral Flora / Fungus negative / AFB negative  Sputum Ctx (04/13/16):  Oral Flora  Sputum Ctx (11/09/15):  Oral Flora / Fungus negative / AFB negative Sputum Ctx (04/19/15): Oral Flora/AFB negative/Fungal  negative  LABS 05/30/16 CBC:  8.9/12.3/38.5/267  04/11/15 Alpha-1 antitrypsin: MM (153) IgM: 31 IgA: 195 IgG: 822    Assessment & Plan:  83 y.o. male with chronic bronchitis and mild bronchiectasis. Also with known GERD. Patient's excessive fatigue could be due to underlying sleep apnea. Although, he has minimal symptoms to suggest this other than morning headache. Overall his bronchiectasis seems to be well-controlled with excellent airway clearance and no exacerbations since last appointment. His reflux seems intermittent and of little consequence. I instructed the patient contact my office if he had any new breathing problems or questions before his next appointment.  1. Chronic bronchitis/mild bronchiectasis:Patient encouraged to use flutter valve along with albuterol/3% hypertonic saline regimen at least once daily. Holding Spiriva given urinary retention. 2. GERD: Continuing intermittent use of medication for acid suppression. 3. Fatigue: Checking overnight oximetry. May require polysomnogram depending upon findings. 4. Health maintenance: Status post Influenza Vaccine October 2017, Pneumovax January 1999 & Prevnar July 2015. 5. Follow-up: Return to clinic in 6 months or sooner if needed.  Sonia Baller Ashok Cordia, M.D. [facility] Pager:  [phone] After 3pm or if no response, call [phone] 3:08 PM 10/08/16

## 2016-10-08 NOTE — Patient Instructions (Addendum)
   Go ahead and stop using your Spiriva because this can make it hard for you to urinate.  Try to use your Flutter Valve, 3% hypertonic saline, & albuterol as prescribed at least once a day - preferably twice a day if you can.  Call me if you have any questions or new breathing problems.   TESTS ORDERED: 1. Overnight Oximetry on room air

## 2016-10-18 DIAGNOSIS — J42 Unspecified chronic bronchitis: Secondary | ICD-10-CM | POA: Diagnosis not present

## 2016-10-18 DIAGNOSIS — J479 Bronchiectasis, uncomplicated: Secondary | ICD-10-CM | POA: Diagnosis not present

## 2016-10-19 ENCOUNTER — Encounter: Payer: Self-pay | Admitting: Pulmonary Disease

## 2016-10-19 DIAGNOSIS — J42 Unspecified chronic bronchitis: Secondary | ICD-10-CM | POA: Diagnosis not present

## 2016-10-19 DIAGNOSIS — J479 Bronchiectasis, uncomplicated: Secondary | ICD-10-CM | POA: Diagnosis not present

## 2016-10-25 ENCOUNTER — Encounter: Payer: Self-pay | Admitting: Family Medicine

## 2016-10-25 ENCOUNTER — Ambulatory Visit (INDEPENDENT_AMBULATORY_CARE_PROVIDER_SITE_OTHER): Payer: Medicare Other | Admitting: Family Medicine

## 2016-10-25 VITALS — BP 102/65 | HR 67 | Temp 97.3°F | Ht 70.5 in | Wt 160.0 lb

## 2016-10-25 DIAGNOSIS — N400 Enlarged prostate without lower urinary tract symptoms: Secondary | ICD-10-CM | POA: Diagnosis not present

## 2016-10-25 DIAGNOSIS — K4090 Unilateral inguinal hernia, without obstruction or gangrene, not specified as recurrent: Secondary | ICD-10-CM

## 2016-10-25 DIAGNOSIS — J449 Chronic obstructive pulmonary disease, unspecified: Secondary | ICD-10-CM | POA: Diagnosis not present

## 2016-10-25 DIAGNOSIS — R5383 Other fatigue: Secondary | ICD-10-CM

## 2016-10-25 DIAGNOSIS — E559 Vitamin D deficiency, unspecified: Secondary | ICD-10-CM

## 2016-10-25 DIAGNOSIS — K219 Gastro-esophageal reflux disease without esophagitis: Secondary | ICD-10-CM

## 2016-10-25 DIAGNOSIS — I700 Atherosclerosis of aorta: Secondary | ICD-10-CM | POA: Diagnosis not present

## 2016-10-25 NOTE — Patient Instructions (Signed)
Medicare Annual Wellness Visit   and the medical providers at [facility] strive to bring you the best medical care.  In doing so we not only want to address your current medical conditions and concerns but also to detect new conditions early and prevent illness, disease and health-related problems.    Medicare offers a yearly Wellness Visit which allows our clinical staff to assess your need for preventative services including immunizations, lifestyle education, counseling to decrease risk of preventable diseases and screening for fall risk and other medical concerns.    This visit is provided free of charge (no copay) for all Medicare recipients. The clinical pharmacists at [facility] have begun to conduct these Wellness Visits which will also include a thorough review of all your medications.    As you primary medical provider recommend that you make an appointment for your Annual Wellness Visit if you have not done so already this year.  You may set up this appointment before you leave today or you may call back ([phone]) and schedule an appointment.  Please make sure when you call that you mention that you are scheduling your Annual Wellness Visit with the clinical pharmacist so that the appointment may be made for the proper length of time.     Continue current medications. Continue good therapeutic lifestyle changes which include good diet and exercise. Fall precautions discussed with patient. If an FOBT was given today- please return it to our front desk. If you are over 50 years old - you may need Prevnar 13 or the adult Pneumonia vaccine.  **Flu shots are available--- please call and schedule a FLU-CLINIC appointment**  After your visit with us today you will receive a survey in the mail or online from [employer] regarding your care with us. Please take a moment to fill this out. Your feedback is very  important to us as you can help us better understand your patient needs as well as improve your experience and satisfaction. WE CARE ABOUT YOU!!!    

## 2016-10-25 NOTE — Addendum Note (Signed)
Addended by: Zannie Cove on: 10/25/2016 09:58 AM   Modules accepted: Orders

## 2016-10-25 NOTE — Progress Notes (Signed)
Subjective:    Patient ID: Charles Golden, male    DOB: 05-14-1926, 83 y.o.   MRN: [id number]  HPI Pt here for follow up and management of chronic medical problems which includes gerd. He is taking medication regularly.This patient is followed regularly by his gastroenterologist, his urologist, and his pulmonologist. He has chronic bronchitis with bronchiectasis. He has collagenous colitis that is followed by the gastroenterologist. He is followed by Dr. Earlean Shawl and Dr. Ashok Cordia. The patient denies any chest pain or more shortness of breath than usual. He denies any nausea or vomiting or blood in the stool and no black tarry bowel movements. He is passing his water well despite his enlarged prostate gland. He has an upcoming appointment with the urologist and now plans to see the gastroenterologist only if needed. He follows up regularly with the pulmonologist about every 6 months. His biggest concern currently is his weakness and fatigue especially early in the morning. He is currently doing a oxygen level test for his pulmonologist and has not heard the results of this. His mind is good but he does worry about his health.     Patient Active Problem List   Diagnosis Date Noted  . Thoracic aortic atherosclerosis ([place]) 05/03/2015  . Upper airway cough syndrome 04/28/2015  . GERD (gastroesophageal reflux disease) 04/11/2015  . Tinea corporis 02/15/2015  . Acute medial meniscal tear 07/20/2014  . COPD GOLD I  12/08/2013  . Small bowel volvulus ([place]) 08/05/2013  . Chronic headache 08/05/2013  . Left inguinal hernia 08/05/2013  . Incisional hernia, without obstruction or gangrene 08/05/2013  . Bronchiectasis without acute exacerbation ([place]) 06/22/2013  . Headache 05/01/2013  . Obstructive chronic bronchitis without exacerbation ([place]) 04/30/2013  . Left hip pain 04/29/2013  . BPH (benign prostatic hyperplasia) 01/05/2013  . IBS (irritable bowel syndrome) 09/03/2012  . Chronic rhinosinusitis  09/03/2012  . Lung nodules 05/04/2003   Outpatient Encounter Prescriptions as of 10/25/2016  Medication Sig  . albuterol (PROVENTIL) (2.5 MG/3ML) 0.083% nebulizer solution Take 3 mLs (2.5 mg total) by nebulization every 6 (six) hours as needed for wheezing or shortness of breath.  Marland Kitchen aspirin EC 81 MG tablet Take 81 mg by mouth daily.  Marland Kitchen azelastine (ASTELIN) 0.1 % nasal spray PLACE 1 SPRAY INTO THE NOSE 1 DAY OR 1 DOSE. USE IN EACH NOSTRIL AS DIRECTED  . Cholecalciferol (VITAMIN D3) 2000 UNITS TABS Take 1 tablet by mouth daily.  . Cyanocobalamin (VITAMIN B-12) 5000 MCG SUBL Place 1 tablet under the tongue daily.  Marland Kitchen doxazosin (CARDURA) 8 MG tablet TAKE 1/2 TO 1 TABLET BY MOUTH DAILY  . finasteride (PROSCAR) 5 MG tablet Take 1 tablet by mouth daily.  . fluticasone (FLONASE) 50 MCG/ACT nasal spray 1 spray in each nostril qd  . guaiFENesin (MUCINEX) 600 MG 12 hr tablet Take 600 mg by mouth as needed. Reported on 05/25/2015  . latanoprost (XALATAN) 0.005 % ophthalmic solution Place 1 drop into the left eye at bedtime.  . Probiotic Product (ALIGN) 4 MG CAPS Take 1 tablet by mouth daily.  Marland Kitchen Respiratory Therapy Supplies (FLUTTER) DEVI 1 Device by Other route as needed.  Marland Kitchen SBI/Protein Isolate (ENTERAGAM PO) Take by mouth.  . sodium chloride HYPERTONIC 3 % nebulizer solution Take by nebulization 2 (two) times daily.  . [DISCONTINUED] Tiotropium Bromide-Olodaterol (STIOLTO RESPIMAT) 2.5-2.5 MCG/ACT AERS Inhale 2 puffs into the lungs daily.   No facility-administered encounter medications on file as of 10/25/2016.      Review of Systems  Constitutional: Positive for fatigue.  HENT: Negative.   Eyes: Negative.   Respiratory: Negative.   Cardiovascular: Negative.   Gastrointestinal: Positive for diarrhea (Dr Earlean Shawl following).  Endocrine: Negative.   Genitourinary: Negative.   Musculoskeletal: Negative.   Skin: Negative.   Allergic/Immunologic: Negative.   Neurological: Negative.   Hematological:  Negative.   Psychiatric/Behavioral: Negative.        Objective:   Physical Exam  Constitutional: He is oriented to person, place, and time. He appears well-developed and well-nourished. No distress.  The patient is pleasant and alert  HENT:  Head: Normocephalic and atraumatic.  Right Ear: External ear normal.  Left Ear: External ear normal.  Nose: Nose normal.  Mouth/Throat: Oropharynx is clear and moist. No oropharyngeal exudate.  Minimal nasal congestion and sinus congestion today which is unusual. Also no obvious drainage in the posterior throat which is also unusual for this patient.  Eyes: Conjunctivae and EOM are normal. Pupils are equal, round, and reactive to light. Right eye exhibits no discharge. Left eye exhibits no discharge. No scleral icterus.  Neck: Normal range of motion. Neck supple. No thyromegaly present.  No bruits thyromegaly or anterior cervical adenopathy  Cardiovascular: Normal rate, regular rhythm, normal heart sounds and intact distal pulses.   No murmur heard. The heart is regular at 72/m  Pulmonary/Chest: Effort normal and breath sounds normal. No respiratory distress. He has no wheezes. He has no rales. He exhibits no tenderness.  No axillary adenopathy. Lungs were clear today even with having the patient cough. There is no wheezing or congestion with coughing. This is also unusual for this patient. Meaning he seems to be doing very well at this point in time.  Abdominal: Soft. Bowel sounds are normal. He exhibits no mass. There is no tenderness. There is no rebound and no guarding.  The patient does have a ventral hernia and with standing a fairly large right inguinal hernia which is reducible. The patient says this is tender at times and sore at times. Otherwise no liver or spleen enlargement abdominal masses or bruits  Genitourinary:  Genitourinary Comments: The patient follows up regularly with his urologist because of his BPH.  Musculoskeletal: Normal  range of motion. He exhibits no edema.  Lymphadenopathy:    He has no cervical adenopathy.  Neurological: He is alert and oriented to person, place, and time. He has normal reflexes. No cranial nerve deficit.  Skin: Skin is warm and dry. No rash noted.  Psychiatric: He has a normal mood and affect. His behavior is normal. Judgment and thought content normal.  Nursing note and vitals reviewed.  BP 102/65 (BP Location: Left Arm)   Pulse 67   Temp 97.3 F (36.3 C) (Oral)   Ht 5' 10.5" (1.791 m)   Wt 160 lb (72.6 kg)   BMI 22.63 kg/m         Assessment & Plan:  1. Vitamin D deficiency -Continue with current treatment pending results of lab work - CBC with Differential/Platelet - VITAMIN D 25 Hydroxy (Vit-D Deficiency, Fractures)  2. Thoracic aortic atherosclerosis ([place]) -Continue with aggressive therapeutic lifestyle changes which include diet and exercise - CBC with Differential/Platelet - BMP8+EGFR - Lipid panel  3. Benign prostatic hyperplasia, unspecified whether lower urinary tract symptoms present -Continue to follow up regularly with the urologist as planned - CBC with Differential/Platelet  4. Gastroesophageal reflux disease, esophagitis presence not specified -The patient is currently having no problems with reflux or complaints. He does see the gastroenterologist on  an as needed basis. - CBC with Differential/Platelet - Hepatic function panel  5. Other fatigue -He is currently having an oxygen study done by the pulmonologist and the results of this have not been returned. We will check a thyroid profile today. - CBC with Differential/Platelet - BMP8+EGFR - Thyroid Panel With TSH  6. Obstructive chronic bronchitis without exacerbation ([facility]) -Continue current breathing medications and inhalers as well as Mucinex and Flonase. -Always avoid irritating environments and wear protective equipment especially with mowing the yard and being out in the pollen.  7. COPD  GOLD I  -Continue to follow-up with pulmonology and use inhalers Mucinex etc.  8. Right inguinal hernia -Appointment with Dr. Arnoldo Morale for evaluation especially because the patient does complain of some discomfort with this at times. -The patient was reminded that if he developed any severe pain in this area he should go to the emergency room immediately.  Patient Instructions                       Medicare Annual Wellness Visit  [place] and the medical providers at [facility] strive to bring you the best medical care.  In doing so we not only want to address your current medical conditions and concerns but also to detect new conditions early and prevent illness, disease and health-related problems.    Medicare offers a yearly Wellness Visit which allows our clinical staff to assess your need for preventative services including immunizations, lifestyle education, counseling to decrease risk of preventable diseases and screening for fall risk and other medical concerns.    This visit is provided free of charge (no copay) for all Medicare recipients. The clinical pharmacists at [facility] have begun to conduct these Wellness Visits which will also include a thorough review of all your medications.    As you primary medical provider recommend that you make an appointment for your Annual Wellness Visit if you have not done so already this year.  You may set up this appointment before you leave today or you may call back ([phone]) and schedule an appointment.  Please make sure when you call that you mention that you are scheduling your Annual Wellness Visit with the clinical pharmacist so that the appointment may be made for the proper length of time.     Continue current medications. Continue good therapeutic lifestyle changes which include good diet and exercise. Fall precautions discussed with patient. If an FOBT was given today- please return  it to our front desk. If you are over 54 years old - you may need Prevnar 52 or the adult Pneumonia vaccine.  **Flu shots are available--- please call and schedule a FLU-CLINIC appointment**  After your visit with [place] today you will receive a survey in the mail or online from [employer] regarding your care with [place]. Please take a moment to fill this out. Your feedback is very important to [place] as you can help [place] better understand your patient needs as well as improve your experience and satisfaction. WE CARE ABOUT YOU!!!     Arrie Senate MD

## 2016-10-26 ENCOUNTER — Telehealth: Payer: Self-pay | Admitting: Pulmonary Disease

## 2016-10-26 LAB — BMP8+EGFR
BUN/Creatinine Ratio: 10 (ref 10–24)
BUN: 14 mg/dL (ref 8–27)
CO2: 24 mmol/L (ref 18–29)
CREATININE: 1.45 mg/dL — AB (ref 0.76–1.27)
Calcium: 9 mg/dL (ref 8.6–10.2)
Chloride: 104 mmol/L (ref 96–106)
GFR, EST AFRICAN AMERICAN: 49 mL/min/{1.73_m2} — AB (ref >59)
GFR, EST NON AFRICAN AMERICAN: 42 mL/min/{1.73_m2} — AB (ref >59)
Glucose: 97 mg/dL (ref 65–99)
Potassium: 4.5 mmol/L (ref 3.5–5.2)
Sodium: 140 mmol/L (ref 134–144)

## 2016-10-26 LAB — CBC WITH DIFFERENTIAL/PLATELET
BASOS: 0 %
Basophils Absolute: 0 10*3/uL (ref 0.0–0.2)
EOS (ABSOLUTE): 0.2 10*3/uL (ref 0.0–0.4)
Eos: 3 %
HEMATOCRIT: 42.5 % (ref 37.5–51.0)
HEMOGLOBIN: 13.9 g/dL (ref 13.0–17.7)
IMMATURE GRANS (ABS): 0 10*3/uL (ref 0.0–0.1)
Immature Granulocytes: 0 %
LYMPHS: 28 %
Lymphocytes Absolute: 1.9 10*3/uL (ref 0.7–3.1)
MCH: 30.6 pg (ref 26.6–33.0)
MCHC: 32.7 g/dL (ref 31.5–35.7)
MCV: 94 fL (ref 79–97)
MONOCYTES: 7 %
Monocytes Absolute: 0.5 10*3/uL (ref 0.1–0.9)
NEUTROS ABS: 4.3 10*3/uL (ref 1.4–7.0)
NEUTROS PCT: 62 %
Platelets: 254 10*3/uL (ref 150–379)
RBC: 4.54 x10E6/uL (ref 4.14–5.80)
RDW: 13.8 % (ref 12.3–15.4)
WBC: 7 10*3/uL (ref 3.4–10.8)

## 2016-10-26 LAB — THYROID PANEL WITH TSH
Free Thyroxine Index: 1.8 (ref 1.2–4.9)
T3 Uptake Ratio: 25 % (ref 24–39)
T4 TOTAL: 7 ug/dL (ref 4.5–12.0)
TSH: 2.73 u[IU]/mL (ref 0.450–4.500)

## 2016-10-26 LAB — HEPATIC FUNCTION PANEL
ALK PHOS: 83 IU/L (ref 39–117)
ALT: 10 IU/L (ref 0–44)
AST: 15 IU/L (ref 0–40)
Albumin: 3.7 g/dL (ref 3.5–4.7)
Bilirubin Total: 0.3 mg/dL (ref 0.0–1.2)
Bilirubin, Direct: 0.11 mg/dL (ref 0.00–0.40)
TOTAL PROTEIN: 6.3 g/dL (ref 6.0–8.5)

## 2016-10-26 LAB — LIPID PANEL
CHOL/HDL RATIO: 2.7 ratio (ref 0.0–5.0)
CHOLESTEROL TOTAL: 147 mg/dL (ref 100–199)
HDL: 55 mg/dL (ref >39)
LDL CALC: 78 mg/dL (ref 0–99)
Triglycerides: 72 mg/dL (ref 0–149)
VLDL CHOLESTEROL CAL: 14 mg/dL (ref 5–40)

## 2016-10-26 LAB — VITAMIN D 25 HYDROXY (VIT D DEFICIENCY, FRACTURES): VIT D 25 HYDROXY: 38 ng/mL (ref 30.0–100.0)

## 2016-10-26 NOTE — Telephone Encounter (Signed)
OVERNIGHT OXIMETRY 05/24 - 10/19/16:  Performed on room air. Lowest saturation 85% Route 23 minutes 44 seconds of saturation less than or equal to 88% on room air. Lowest heart rate 42 bpm. Average saturation 92% & average pulse 63 bpm.  Please contact the patient and let him know that his overnight oximetry shows significant desaturation and is concerning for possible sleep apnea. This could certainly be contributing to the fatigue that we discussed at his appointment. If he is willing, please order a polysomnogram to evaluate for sleep apnea given his nocturnal hypoxia. Thank you.

## 2016-10-30 ENCOUNTER — Encounter: Payer: Self-pay | Admitting: Pulmonary Disease

## 2016-10-30 DIAGNOSIS — R5383 Other fatigue: Secondary | ICD-10-CM

## 2016-10-30 NOTE — Telephone Encounter (Signed)
See 6/5 email

## 2016-11-02 NOTE — Telephone Encounter (Signed)
Followed up with patient as I did not see a response from the email. Pt was informed of results and recommendations for polysomnogram. Pt states he has stuff going in his personal life and he can't make a decision now if he wants to have test done. Patient states he will call us first of the week of 6/10 to give [place] a decision.

## 2016-11-05 ENCOUNTER — Ambulatory Visit (INDEPENDENT_AMBULATORY_CARE_PROVIDER_SITE_OTHER): Payer: Medicare Other | Admitting: Physician Assistant

## 2016-11-05 ENCOUNTER — Encounter: Payer: Self-pay | Admitting: Physician Assistant

## 2016-11-05 VITALS — BP 124/67 | HR 86 | Temp 96.4°F | Ht 70.0 in | Wt 162.0 lb

## 2016-11-05 DIAGNOSIS — S60551A Superficial foreign body of right hand, initial encounter: Secondary | ICD-10-CM | POA: Diagnosis not present

## 2016-11-05 DIAGNOSIS — Z23 Encounter for immunization: Secondary | ICD-10-CM

## 2016-11-05 MED ORDER — CEPHALEXIN 500 MG PO CAPS: 500.0000 mg | ORAL_CAPSULE | Freq: Two times a day (BID) | ORAL | 0 refills | Status: DC

## 2016-11-05 NOTE — Progress Notes (Signed)
Subjective:     Patient ID: Charles Golden, male   DOB: Nov 06, 1925, 83 y.o.   MRN: [id number]  HPI Pt with splinter to the palm of the R hand Splinter from old untreated 2x4 + redness and pain Unsure of last tetanus inj   Review of Systems Denies drainage No pain to the hand or wrist     Objective:   Physical Exam + erythema to the palmar thenar area ++ TTP of same No drainage from the site Area cleansed with Betadine Attempted to unroof with TB needle After unsuccessful attempted pt asked for further incision Consent obtained area again cleanse Anesth with 1.5 cc lido with epi Small incision made Unable to find splinter Area cleansed and dressing placed     Assessment:     1. Foreign body of right hand, initial encounter        Plan:     Wound care reviewed Tetanus inj updated today Keflex 500mg  bid x 1 week F/U if any S/S of infection

## 2016-11-05 NOTE — Patient Instructions (Signed)

## 2016-11-05 NOTE — Telephone Encounter (Signed)
Test has been ordered.

## 2016-11-05 NOTE — Telephone Encounter (Signed)
[phone] pt calling back  Pt is ok with the test being ordered

## 2016-11-07 DIAGNOSIS — Z961 Presence of intraocular lens: Secondary | ICD-10-CM | POA: Diagnosis not present

## 2016-11-07 DIAGNOSIS — H401122 Primary open-angle glaucoma, left eye, moderate stage: Secondary | ICD-10-CM | POA: Diagnosis not present

## 2016-11-07 DIAGNOSIS — H04123 Dry eye syndrome of bilateral lacrimal glands: Secondary | ICD-10-CM | POA: Diagnosis not present

## 2016-11-07 DIAGNOSIS — H40051 Ocular hypertension, right eye: Secondary | ICD-10-CM | POA: Diagnosis not present

## 2016-11-12 ENCOUNTER — Encounter: Payer: Self-pay | Admitting: Pulmonary Disease

## 2016-11-12 NOTE — Telephone Encounter (Signed)
Called pt d/t this sounding like a "sick" message- Pt states that X1 week ago he started having sinus congestion, PND, fatigue, chills.  About the time these s/s started, pt had a splinter removed from finger and was given an abx (could not remember the name of abx) which did not help his symptoms.  Pt states his congestion has worsened and settled in chest, prod cough with lots of green thick mucus worse qam.   Per pt's chart, pt was given Cephalexin 500mg  BID X7 days.  Pt requesting further recs.  Uses [facility] in [place].    JN please advise on recs.  Thanks!

## 2016-11-12 NOTE — Telephone Encounter (Signed)
He needs to come for [facility] tomorrow.

## 2016-11-12 NOTE — Telephone Encounter (Signed)
Pt scheduled to see MW tomorrow at 8:45.  Nothing further needed.

## 2016-11-12 NOTE — Telephone Encounter (Signed)
Sending to DOD as Durene Cal has not yet addressed. VS please advise on recs.  Thanks!

## 2016-11-13 ENCOUNTER — Ambulatory Visit (INDEPENDENT_AMBULATORY_CARE_PROVIDER_SITE_OTHER)
Admission: RE | Admit: 2016-11-13 | Discharge: 2016-11-13 | Disposition: A | Discharge status: Home / Self Care | Payer: Medicare Other | Source: Ambulatory Visit | Attending: Internal Medicine | Admitting: Internal Medicine

## 2016-11-13 ENCOUNTER — Other Ambulatory Visit: Payer: Self-pay

## 2016-11-13 ENCOUNTER — Ambulatory Visit (INDEPENDENT_AMBULATORY_CARE_PROVIDER_SITE_OTHER): Payer: Medicare Other | Admitting: Internal Medicine

## 2016-11-13 ENCOUNTER — Other Ambulatory Visit: Payer: Medicare Other

## 2016-11-13 ENCOUNTER — Encounter: Payer: Self-pay | Admitting: Internal Medicine

## 2016-11-13 VITALS — BP 110/60 | HR 70 | Temp 98.3°F | Ht 70.0 in | Wt 161.0 lb

## 2016-11-13 DIAGNOSIS — R05 Cough: Secondary | ICD-10-CM

## 2016-11-13 DIAGNOSIS — R059 Cough, unspecified: Secondary | ICD-10-CM

## 2016-11-13 DIAGNOSIS — J449 Chronic obstructive pulmonary disease, unspecified: Secondary | ICD-10-CM | POA: Diagnosis not present

## 2016-11-13 MED ORDER — AMOXICILLIN-POT CLAVULANATE 875-125 MG PO TABS: 1.0000 | ORAL_TABLET | Freq: Two times a day (BID) | ORAL | 0 refills | Status: AC

## 2016-11-13 MED ORDER — PREDNISONE 10 MG PO TABS: ORAL_TABLET | ORAL | 0 refills | Status: DC

## 2016-11-13 NOTE — Telephone Encounter (Signed)
Spoke with pt's wife, she states pt has no made it home yet. FYI to nurse please see JN message below about wanting pt to do a sputum culture

## 2016-11-13 NOTE — Telephone Encounter (Signed)
JN  This pt was seen today by Dr. Melvyn Novas, I do not see where they saw your message about the sputum cultures. Did you still want the patient to do these?

## 2016-11-13 NOTE — Progress Notes (Signed)
Spoke with pt and notified of results per Dr. Wert. Pt verbalized understanding and denied any questions. 

## 2016-11-13 NOTE — Telephone Encounter (Signed)
Spoke with pt about JN message. Pt agreed to the additional  testing. Due to him living 40 mins away he will try to find time sometime within in a week to get the cups. He had no further questions at this time. The test were ordered . Nothing further is needed

## 2016-11-13 NOTE — Telephone Encounter (Signed)
Please order a sputum culture for AFB, Fungus & routine bacteria. Can we get him in to see me or one of the NPs if he is agreeable? Thanks.

## 2016-11-13 NOTE — Telephone Encounter (Signed)
Patient returning our call - he can be reached at [phone] -pr

## 2016-11-13 NOTE — Progress Notes (Addendum)
Subjective:    Patient ID: Charles Golden, male    DOB: Jun 06, 1925    MRN: [id number]   Brief patient profile:  88 yowm MM with GOLD I copd/bronchiectasis  sp smoking cessation in 2008     04/28/2015  f/u ov/Wert re: persistent cough /GOLD I copd   Chief Complaint  Patient presents with  . Acute Visit    JN pt here for congestion and cough: pt states over thanksgivning he had a bad cold and some diarreah after he started takingthe zantac Dr. Ashok Cordia perscribed. pt here is to see if he can get off the Zantac.   cough is worse day than noct/ more dry than wet on symb 160 2bid assoc with sense of pnds  Baseline doe = MMRC1 = can walk nl pace, flat grade, can't hurry or go uphills or steps s sob / mainly limited by fatigue   rec Stop the zantac and symbicort  dulera 100 Take 2 puffs first thing in am and then another 2 puffs about 12 hours later - call us if you like it better than the symbicort and we'll call it in for you or the lower strength of symbicort Work on inhaler technique:       11/13/2016 acute extended ov/Wert re: cough flare in pt with COPD/ gold I with  bronchiectasis  Chief Complaint  Patient presents with  . Acute Visit    Pt c/o prod cough with green sputum and increased SOB for the past 5-6 days. He had fever 2 days ago. He also c/o frequent HA.   acutely worse cough  x 6 days  - only using neb in evening "to help me cough" - does help breathing some but doesn't use if for  This purpose, even prn  Only using flutter once every 2 days for 10 min ( not the way he was instructed)  Doe now = MMRC3 = can't walk 100 yards even at a slow pace at a flat grade s stopping due to sob    No obvious day to day or daytime variability or assoc   mucus plugs or hemoptysis or cp or chest tightness, subjective wheeze or overt  hb symptoms. No unusual exp hx or h/o childhood pna/ asthma or knowledge of premature birth.  Sleeping ok without nocturnal  or early am exacerbation  of  respiratory  c/o's or need for noct saba. Also denies any obvious fluctuation of symptoms with weather or environmental changes or other aggravating or alleviating factors except as outlined above   Current Medications, Allergies, Complete Past Medical History, Past Surgical History, Family History, and Social History were reviewed in [employer] record.  ROS  The following are not active complaints unless bolded sore throat, dysphagia, dental problems, itching, sneezing,  nasal congestion or excess/ purulent secretions, ear ache,   fever, chills, sweats, unintended wt loss, classically pleuritic or exertional cp,  orthopnea pnd or leg swelling, presyncope, palpitations, abdominal pain, anorexia, nausea, vomiting, diarrhea  or change in bowel or bladder habits, change in stools or urine, dysuria,hematuria,  rash, arthralgias, visual complaints, headache, numbness, weakness or ataxia or problems with walking or coordination,  change in mood/affect or memory.                  Objective:   Physical Exam   Amb wm congested cough but otherwise nad/ Has not used his nebulizer yet today.  Wt Readings from Last 3 Encounters:  11/13/16 161  lb (73 kg)  11/05/16 162 lb (73.5 kg)  10/25/16 160 lb (72.6 kg)    Vital signs reviewed  - Note on arrival 02 sats  95% on RA     HEENT: nl dentition, turbinates bilaterally, and oropharynx. Nl external ear canals without cough reflex   NECK :  without JVD/Nodes/TM/ nl carotid upstrokes bilaterally   LUNGS: no acc muscle use,  Nl contour chest with pan exp wheeze prior to any saba on day of ov   CV:  RRR  no s3 or murmur or increase in P2, and no edema   ABD:  soft and nontender with nl inspiratory excursion in the supine position. No bruits or organomegaly appreciated, bowel sounds nl  MS:  Nl gait/ ext warm without deformities, calf tenderness, cyanosis or clubbing No obvious joint restrictions   SKIN: warm and dry without  lesions    NEURO:  alert, approp, nl sensorium with  no motor or cerebellar deficits apparent.    PFT 12/20/15: FVC 3.41 L (91%) FEV1 2.21 L (85%) FEV1/FVC 0.65 FEF 25-75 1.09 L (68%) negative bronchodilator response 07/13/15: FVC 3.63 L (96%) FEV1 2.40 L (92%) FEV1/FVC 0.66 FEF 25-75 1.17 L (71%) positive bronchodilator response TLC 8.07 L (112%) RV 143% ERV 106% DLCO corrected 75%  06/22/13: FVC 3.74 L (89%) FEV1 2.39 L (77%) FEV1/FVC 0.64 FEF 25-75 1.17 L (46%)  6MWT 12/21/15:  Walked 360 meters / Baseline Sat 99% on RA / Nadir Sat 97% 07/13/15:  Walked 360 meters / Baseline Sat 97% on RA / Nadir Sat 97% on RA     CXR PA and Lateral:   11/13/2016 :    I personally reviewed images and agree with radiology impression as follows:    COPD. No pneumonia nor CHF. Tiny soft tissue density lung nodules are present bilaterally but are stable.        Assessment & Plan:

## 2016-11-13 NOTE — Patient Instructions (Addendum)
For cough > mucinex up to 1200 mg every 12 hours and use the flutter valve as much as possible  Whenever hocking or coughing > Try prilosec otc 20mg   Take 30-60 min before first meal of the day and Pepcid ac (famotidine) 20 mg one @  bedtime until cough is improved for at least a week    Augmentin 875 mg take one pill twice daily  X 10 days - take at breakfast and supper with large glass of water.  It would help reduce the usual side effects (diarrhea and yeast infections) if you ate cultured yogurt at lunch.   Prednisone 10 mg take  4 each am x 2 days,   2 each am x 2 days,  1 each am x 2 days and stop    When wheeze / short of breath >  Use albuterol 2.5 mg every 4 hours by itself as needed but you should improve and be able to resume use as before once you complete above regimen  Please remember to go to the  x-ray department downstairs in the basement  for your tests - we will call you with the results when they are available.     Please schedule a follow up office visit in 6 weeks, call sooner if needed with Dr Ashok Cordia

## 2016-11-14 ENCOUNTER — Encounter: Payer: Self-pay | Admitting: Internal Medicine

## 2016-11-14 ENCOUNTER — Other Ambulatory Visit: Payer: Medicare Other

## 2016-11-14 DIAGNOSIS — R05 Cough: Secondary | ICD-10-CM | POA: Diagnosis not present

## 2016-11-14 DIAGNOSIS — R059 Cough, unspecified: Secondary | ICD-10-CM

## 2016-11-14 NOTE — Assessment & Plan Note (Addendum)
12/20/15: FVC 3.41 L (91%) FEV1 2.21 L (85%) FEV1/FVC 0.65 FEF 25-75 1.09 L (68%) negative bronchodilator response 07/13/15: FVC 3.63 L (96%) FEV1 2.40 L (92%) FEV1/FVC 0.66 FEF 25-75 1.17 L (71%) positive bronchodilator response TLC 8.07 L (112%) RV 143% ERV 106% DLCO corrected 75%  06/22/13: FVC 3.74 L (89%) FEV1 2.39 L (77%) FEV1/FVC 0.64 FEF 25-75 1.17 L (46%)  Acute flare in setting of possible sinusitis with fever and headache and an expiratory wheezing on exam.  His present regimen is more designed to control chronic bronchiectasis than  the treatment of acute exacerbation of COPD but it certainly could be that he is having acute exacerbation of obstructive bronchiectasis. Regardless of what we call it I recommended   the following dualr rx  prednisone for 6 day taper  And  Augmentin for 10 days and also reviewed with him in detail how to suppress secondary reflux and to maximize mucociliary clearance by using maximum doses of Mucinex and flutter valve which he already possesses but does not use the way Dr. Ashok Cordia intended.  I had an extended discussion with the patient reviewing all relevant studies completed to date and  lasting 25 minutes of a 40  minute acute visit with pt not seen by me in over 2 years so not familiar to me     re  severe non-specific but potentially very serious refractory respiratory symptoms of uncertain and potentially multiple  etiologies.   Each maintenance medication was reviewed in detail including most importantly the difference between maintenance and prns and under what circumstances the prns are to be triggered using an action plan format that is not reflected in the computer generated alphabetically organized AVS.    Please see AVS for specific instructions unique to this office visit that I personally wrote and verbalized to the the pt in detail and then reviewed with pt  by my nurse highlighting any changes in therapy/plan of care  recommended at today's  visit.

## 2016-11-15 ENCOUNTER — Ambulatory Visit (INDEPENDENT_AMBULATORY_CARE_PROVIDER_SITE_OTHER): Payer: Medicare Other | Admitting: General Surgery

## 2016-11-15 ENCOUNTER — Ambulatory Visit: Payer: Medicare Other | Attending: Pulmonary Disease | Admitting: Neurology

## 2016-11-15 ENCOUNTER — Encounter: Payer: Self-pay | Admitting: General Surgery

## 2016-11-15 VITALS — BP 120/68 | HR 68 | Temp 98.2°F | Resp 18 | Ht 70.0 in | Wt 162.0 lb

## 2016-11-15 DIAGNOSIS — G4761 Periodic limb movement disorder: Secondary | ICD-10-CM | POA: Insufficient documentation

## 2016-11-15 DIAGNOSIS — Z7982 Long term (current) use of aspirin: Secondary | ICD-10-CM | POA: Insufficient documentation

## 2016-11-15 DIAGNOSIS — R5383 Other fatigue: Secondary | ICD-10-CM | POA: Diagnosis not present

## 2016-11-15 DIAGNOSIS — K4091 Unilateral inguinal hernia, without obstruction or gangrene, recurrent: Secondary | ICD-10-CM

## 2016-11-15 DIAGNOSIS — Z79899 Other long term (current) drug therapy: Secondary | ICD-10-CM | POA: Insufficient documentation

## 2016-11-15 DIAGNOSIS — Z7951 Long term (current) use of inhaled steroids: Secondary | ICD-10-CM | POA: Diagnosis not present

## 2016-11-15 DIAGNOSIS — G4733 Obstructive sleep apnea (adult) (pediatric): Secondary | ICD-10-CM | POA: Diagnosis not present

## 2016-11-15 NOTE — Progress Notes (Signed)
Charles Golden; [id number]; November 15, 1925   HPI   Patient is an 83 year old white male who was referred to my care for evaluation and treatment by Charles Golden for a recurrent right inguinal hernia.  Patient states he has had for some time, but he was told by his primary care physician to have it evaluated.  It is made worse with straining or coughing.  He is able to reduce it on its own.  He currently has 6 out pain when it sticks out.  He also has a splinter in his right hand which was removed 10 days ago by primary care physician.  He thinks they are splinter may be still present.  No nausea or vomiting have been noted.  He had a right inguinal hernia repair in the 1970s. Past Medical History:  Diagnosis Date  . Bronchitis, chronic ([place])   . COPD (chronic obstructive pulmonary disease) ([place])   . Enlarged prostate   . GERD (gastroesophageal reflux disease)   . Glucagonoma   . Hernia, incisional    at present  . HOH (hard of hearing)   . IBS (irritable bowel syndrome)   . Sciatic pain   . Sinus congestion     Past Surgical History:  Procedure Laterality Date  . BOWEL RESECTION N/A 09/04/2012   Procedure: SMALL BOWEL RESECTION;  Surgeon: Charles Heinz, MD;  Location: [facility];  Service: General;  Laterality: N/A;  Anastimosis  . HEMORRHOID SURGERY    . HERNIA REPAIR Right 70's  . INGUINAL HERNIA REPAIR Left 09/14/2013   Procedure: LEFT INGUINAL HERNIORRHAPHY;  Surgeon: Charles So, MD;  Location: [facility];  Service: General;  Laterality: Left;  . INSERTION OF MESH Left 09/14/2013   Procedure: INSERTION OF MESH;  Surgeon: Charles So, MD;  Location: [facility];  Service: General;  Laterality: Left;  . KNEE ARTHROSCOPY Left 07/21/2014   Procedure: LEFT KNEE ARTHROSCOPY WITH MEDIAL MENISCAL DEBRIDEMENT ;  Surgeon: Charles Alf, MD;  Location: [facility];  Service: Orthopedics;  Laterality: Left;  . LAPAROTOMY N/A 09/04/2012   Procedure: EXPLORATORY LAPAROTOMY;  Surgeon: Charles Heinz, MD;  Location:  [facility];  Service: General;  Laterality: N/A;  . SKIN LESION EXCISION     Charles Golden  . TONSILLECTOMY      Family History  Problem Relation Age of Onset  . Heart disease Mother        Valve replacement and pacemaker  . Colon cancer Father   . Asthma Unknown        cousin    Current Outpatient Prescriptions on File Prior to Visit  Medication Sig Dispense Refill  . albuterol (PROVENTIL) (2.5 MG/3ML) 0.083% nebulizer solution Take 3 mLs (2.5 mg total) by nebulization every 6 (six) hours as needed for wheezing or shortness of breath. 75 mL 3  . amoxicillin-clavulanate (AUGMENTIN) 875-125 MG tablet Take 1 tablet by mouth 2 (two) times daily. 20 tablet 0  . aspirin EC 81 MG tablet Take 81 mg by mouth daily.    Marland Kitchen azelastine (ASTELIN) 0.1 % nasal spray PLACE 1 SPRAY INTO THE NOSE 1 DAY OR 1 DOSE. USE IN EACH NOSTRIL AS DIRECTED 30 mL 11  . Cholecalciferol (VITAMIN D3) 2000 UNITS TABS Take 1 tablet by mouth daily.    . Cyanocobalamin (VITAMIN B-12) 5000 MCG SUBL Place 1 tablet under the tongue daily.    Marland Kitchen doxazosin (CARDURA) 8 MG tablet TAKE 1/2 TO 1 TABLET BY MOUTH DAILY 30 tablet 4  .  finasteride (PROSCAR) 5 MG tablet Take 1 tablet by mouth daily.  10  . fluticasone (FLONASE) 50 MCG/ACT nasal spray 1 spray in each nostril qd 16 g 11  . guaiFENesin (MUCINEX) 600 MG 12 hr tablet Take 600 mg by mouth as needed. Reported on 05/25/2015    . latanoprost (XALATAN) 0.005 % ophthalmic solution Place 1 drop into the left eye at bedtime. 2.5 mL 1  . predniSONE (DELTASONE) 10 MG tablet Take  4 each am x 2 days,   2 each am x 2 days,  1 each am x 2 days and stop 14 tablet 0  . Probiotic Product (ALIGN) 4 MG CAPS Take 1 tablet by mouth daily.    Marland Kitchen Respiratory Therapy Supplies (FLUTTER) DEVI 1 Device by Other route as needed. 1 each 0  . sodium chloride HYPERTONIC 3 % nebulizer solution Take by nebulization 2 (two) times daily. 750 mL 3   No current facility-administered medications on file prior to visit.      Allergies  Allergen Reactions  . Demerol [Meperidine] Other (See Comments)    Pt. States "he woke up during a colonoscopy"  . Meperidine Hcl Other (See Comments)    Unknown   . Spiriva Handihaler [Tiotropium Bromide Monohydrate] Other (See Comments)    Mild Urinary Retention    History  Alcohol Use  . 0.0 oz/week    Comment: Rare beer or wine.    History  Smoking Status  . Former Smoker  . Packs/day: 1.00  . Years: 30.00  . Types: Cigarettes  . Quit date: 05/28/2006  Smokeless Tobacco  . Never Used    Review of Systems  Constitutional: Positive for malaise/fatigue.  HENT: Positive for sinus pain.   Eyes: Negative.   Respiratory: Positive for cough, shortness of breath and wheezing.   Cardiovascular: Negative.   Gastrointestinal: Negative.   Genitourinary: Positive for frequency.  Musculoskeletal: Negative.   Skin: Negative.   Neurological: Negative.   Endo/Heme/Allergies: Negative.   Psychiatric/Behavioral: Negative.     Objective   Vitals:   11/15/16 1109  BP: 120/68  Pulse: 68  Resp: 18  Temp: 98.2 F (36.8 C)    Physical Exam  Constitutional: He is oriented to person, place, and time and well-developed, well-nourished, and in no distress.  HENT:  Head: Normocephalic and atraumatic.  Neck: Normal range of motion. Neck supple.  Cardiovascular: Normal rate, regular rhythm and normal heart sounds.   No murmur heard. Pulmonary/Chest: Effort normal and breath sounds normal. He has no wheezes. He has no rales.  Abdominal: Soft. Bowel sounds are normal. He exhibits no distension. There is no tenderness.  Easily reducible right inguinal hernia present.  No hydrocele present.  Neurological: He is alert and oriented to person, place, and time.  Skin: Skin is warm and dry.  Vitals reviewed. Charles. Tawanna Golden notes reviewed.  Assessment    Recurrent right inguinal hernia Plan    patient states he is not too symptomatic at this time, does not like to delay  surgery if at all possible.  I did describe the signs and symptoms of incarceration and held to reduce the hernia.  Should it stays incarcerated, he was instructed to go the emergency room.  He will follow up in several weeks since he still think this is present in his right hand.

## 2016-11-15 NOTE — Patient Instructions (Signed)

## 2016-11-21 LAB — FUNGUS CULTURE W SMEAR

## 2016-11-24 NOTE — Procedures (Signed)
Herrick A. Merlene Laughter, MD     [email or web address]             NOCTURNAL POLYSOMNOGRAPHY   LOCATION: [facility]   Patient Name: Charles Golden, Charles Golden Date: 11/15/2016 Gender: Male D.O.B: [birth year] Age (years): 89 Referring Provider: Javier Glazier Height (inches): 70 Interpreting Physician: Phillips Odor MD, ABSM Weight (lbs): 160 RPSGT: Peak, Robert BMI: 23 MRN: [id number] Neck Size: 16.00 CLINICAL INFORMATION Sleep Study Type: NPSG  Indication for sleep study: Fatigue  Epworth Sleepiness Score: 2  SLEEP STUDY TECHNIQUE As per the AASM Manual for the Scoring of Sleep and Associated Events v2.3 (April 2016) with a hypopnea requiring 4% desaturations.  The channels recorded and monitored were frontal, central and occipital EEG, electrooculogram (EOG), submentalis EMG (chin), nasal and oral airflow, thoracic and abdominal wall motion, anterior tibialis EMG, snore microphone, electrocardiogram, and pulse oximetry.  MEDICATIONS Medications self-administered by patient taken the night of the study : N/A  Current Outpatient Prescriptions:  .  albuterol (PROVENTIL) (2.5 MG/3ML) 0.083% nebulizer solution, Take 3 mLs (2.5 mg total) by nebulization every 6 (six) hours as needed for wheezing or shortness of breath., Disp: 75 mL, Rfl: 3 .  aspirin EC 81 MG tablet, Take 81 mg by mouth daily., Disp: , Rfl:  .  azelastine (ASTELIN) 0.1 % nasal spray, PLACE 1 SPRAY INTO THE NOSE 1 DAY OR 1 DOSE. USE IN EACH NOSTRIL AS DIRECTED, Disp: 30 mL, Rfl: 11 .  Cholecalciferol (VITAMIN D3) 2000 UNITS TABS, Take 1 tablet by mouth daily., Disp: , Rfl:  .  Cyanocobalamin (VITAMIN B-12) 5000 MCG SUBL, Place 1 tablet under the tongue daily., Disp: , Rfl:  .  doxazosin (CARDURA) 8 MG tablet, TAKE 1/2 TO 1 TABLET BY MOUTH DAILY, Disp: 30 tablet, Rfl: 4 .  finasteride (PROSCAR) 5 MG tablet, Take 1 tablet by mouth daily., Disp: , Rfl: 10 .  fluticasone (FLONASE) 50 MCG/ACT nasal spray,  1 spray in each nostril qd, Disp: 16 g, Rfl: 11 .  guaiFENesin (MUCINEX) 600 MG 12 hr tablet, Take 600 mg by mouth as needed. Reported on 05/25/2015, Disp: , Rfl:  .  latanoprost (XALATAN) 0.005 % ophthalmic solution, Place 1 drop into the left eye at bedtime., Disp: 2.5 mL, Rfl: 1 .  predniSONE (DELTASONE) 10 MG tablet, Take  4 each am x 2 days,   2 each am x 2 days,  1 each am x 2 days and stop, Disp: 14 tablet, Rfl: 0 .  Probiotic Product (ALIGN) 4 MG CAPS, Take 1 tablet by mouth daily., Disp: , Rfl:  .  Respiratory Therapy Supplies (FLUTTER) DEVI, 1 Device by Other route as needed., Disp: 1 each, Rfl: 0 .  sodium chloride HYPERTONIC 3 % nebulizer solution, Take by nebulization 2 (two) times daily., Disp: 750 mL, Rfl: 3   SLEEP ARCHITECTURE The study was initiated at 10:02:05 PM and ended at 5:10:04 AM.  Sleep onset time was 74.4 minutes and the sleep efficiency was 58.7%. The total sleep time was 251.0 minutes.  Stage REM latency was 77.0 minutes.  The patient spent 18.72% of the night in stage N1 sleep, 62.56% in stage N2 sleep, 0.80% in stage N3 and 17.92% in REM.  Alpha intrusion was absent.  Supine sleep was 28.77%.  RESPIRATORY PARAMETERS The overall apnea/hypopnea index (AHI) was 3.6 per hour. There were 13 total apneas, including 13 obstructive, 0 central and 0 mixed apneas. There were 2 hypopneas and 44 RERAs.  The AHI during Stage  REM sleep was 0.0 per hour.  AHI while supine was 12.5 per hour.  The mean oxygen saturation was 93.80%. The minimum SpO2 during sleep was 88.00%.  Moderate snoring was noted during this study.  CARDIAC DATA The 2 lead EKG demonstrated sinus rhythm. The mean heart rate was 54.80 beats per minute. Other EKG findings include: None. LEG MOVEMENT DATA The total PLMS were 207 with a resulting PLMS index of 49.47. Associated arousal with leg movement index was 4.8.  IMPRESSIONS - Severe periodic limb movements of sleep occurred during the  study. - Abnormal sleep architecture is noted with a reduced sleep efficiency and are reduced slow wave sleep.  Delano Metz, MD Diplomate, American Board of Sleep Medicine.  ELECTRONICALLY SIGNED ON:  11/24/2016, 10:05 PM [place] PH: [phone]   FX: [phone] [place]

## 2016-11-25 ENCOUNTER — Encounter: Payer: Self-pay | Admitting: Family Medicine

## 2016-11-25 DIAGNOSIS — G2581 Restless legs syndrome: Secondary | ICD-10-CM | POA: Insufficient documentation

## 2016-11-30 ENCOUNTER — Telehealth: Payer: Self-pay | Admitting: Pulmonary Disease

## 2016-11-30 NOTE — Telephone Encounter (Signed)
Appointment scheduled to see CY on 03/04/17 at 130pm. Pt verbalized understanding. Nothing further is needed.

## 2016-11-30 NOTE — Telephone Encounter (Signed)
Per staff message received from [place]:  Please contact the patient and let him know that his sleep study did not show sleep apnea but did show a limb movement disorder while sleeping. Simply put, he moves his extremities frequently and periodically while sleeping which could be affecting his sleep quality. Please put in a referral to see one of our sleep specialists. Thank you.   Pt was contacted and made aware of results and referral for sleep specialist.

## 2016-12-05 DIAGNOSIS — R350 Frequency of micturition: Secondary | ICD-10-CM | POA: Diagnosis not present

## 2016-12-05 DIAGNOSIS — R351 Nocturia: Secondary | ICD-10-CM | POA: Diagnosis not present

## 2016-12-05 DIAGNOSIS — N401 Enlarged prostate with lower urinary tract symptoms: Secondary | ICD-10-CM | POA: Diagnosis not present

## 2016-12-25 NOTE — Progress Notes (Signed)
Subjective:    Patient ID: Charles Golden, male    DOB: Oct 11, 1925, 83 y.o.   MRN: [id number]  C.C.:  Follow-up for Chronic Bronchitis/Mild Bronchiectasis & GERD.  HPI  Chronic bronchitis/mild bronchiectasis: Prescribed 3% hypertonic saline along with flutter valve twice daily. Patient also previously prescribed Spiriva Respimat. Spiriva stopped previously due to urinary retention. Prescribed prednisone and Augmentin during previous appointment for exacerbation in June with Dr. Melvyn Novas. He does feel his cough may be slightly better. He reports minimal wheezing. He is using his nebulizer in the morning and flutter valve at night. He admits he forgets to use it at times.   GERD: Prescribed Zantac previously which she stopped using in favor of Rolaids for intermittent reflux control. He reports he hasn't had any reflux or dyspepsia recently. No morning brash water taste.   Review of Systems  He denies any fever or chills. He is having intermittent headaches. Denies any focal vision loss. No chest pain or pressure.   Allergies  Allergen Reactions  . Demerol [Meperidine] Other (See Comments)    Pt. States "he woke up during a colonoscopy"  . Meperidine Hcl Other (See Comments)    Unknown   . Spiriva Handihaler [Tiotropium Bromide Monohydrate] Other (See Comments)    Mild Urinary Retention    Current Outpatient Prescriptions on File Prior to Visit  Medication Sig Dispense Refill  . albuterol (PROVENTIL) (2.5 MG/3ML) 0.083% nebulizer solution Take 3 mLs (2.5 mg total) by nebulization every 6 (six) hours as needed for wheezing or shortness of breath. 75 mL 3  . aspirin EC 81 MG tablet Take 81 mg by mouth daily.    Marland Kitchen azelastine (ASTELIN) 0.1 % nasal spray PLACE 1 SPRAY INTO THE NOSE 1 DAY OR 1 DOSE. USE IN EACH NOSTRIL AS DIRECTED 30 mL 11  . Cholecalciferol (VITAMIN D3) 2000 UNITS TABS Take 1 tablet by mouth daily.    . Cyanocobalamin (VITAMIN B-12) 5000 MCG SUBL Place 1 tablet under the tongue  daily.    Marland Kitchen doxazosin (CARDURA) 8 MG tablet TAKE 1/2 TO 1 TABLET BY MOUTH DAILY 30 tablet 4  . finasteride (PROSCAR) 5 MG tablet Take 1 tablet by mouth daily.  10  . fluticasone (FLONASE) 50 MCG/ACT nasal spray 1 spray in each nostril qd 16 g 11  . guaiFENesin (MUCINEX) 600 MG 12 hr tablet Take 600 mg by mouth as needed. Reported on 05/25/2015    . latanoprost (XALATAN) 0.005 % ophthalmic solution Place 1 drop into the left eye at bedtime. 2.5 mL 1  . Probiotic Product (ALIGN) 4 MG CAPS Take 1 tablet by mouth daily.    Marland Kitchen Respiratory Therapy Supplies (FLUTTER) DEVI 1 Device by Other route as needed. 1 each 0  . sodium chloride HYPERTONIC 3 % nebulizer solution Take by nebulization 2 (two) times daily. 750 mL 3   No current facility-administered medications on file prior to visit.     Past Medical History:  Diagnosis Date  . Bronchitis, chronic ([place])   . COPD (chronic obstructive pulmonary disease) ([place])   . Enlarged prostate   . GERD (gastroesophageal reflux disease)   . Glucagonoma   . Hernia, incisional    at present  . HOH (hard of hearing)   . IBS (irritable bowel syndrome)   . Sciatic pain   . Sinus congestion     Past Surgical History:  Procedure Laterality Date  . BOWEL RESECTION N/A 09/04/2012   Procedure: SMALL BOWEL RESECTION;  Surgeon: Ruby Cola  Dondra Prader, MD;  Location: [facility];  Service: General;  Laterality: N/A;  Anastimosis  . HEMORRHOID SURGERY    . HERNIA REPAIR Right 70's  . INGUINAL HERNIA REPAIR Left 09/14/2013   Procedure: LEFT INGUINAL HERNIORRHAPHY;  Surgeon: Jamesetta So, MD;  Location: [facility];  Service: General;  Laterality: Left;  . INSERTION OF MESH Left 09/14/2013   Procedure: INSERTION OF MESH;  Surgeon: Jamesetta So, MD;  Location: [facility];  Service: General;  Laterality: Left;  . KNEE ARTHROSCOPY Left 07/21/2014   Procedure: LEFT KNEE ARTHROSCOPY WITH MEDIAL MENISCAL DEBRIDEMENT ;  Surgeon: Gearlean Alf, MD;  Location: [facility];  Service: Orthopedics;   Laterality: Left;  . LAPAROTOMY N/A 09/04/2012   Procedure: EXPLORATORY LAPAROTOMY;  Surgeon: Donato Heinz, MD;  Location: [facility];  Service: General;  Laterality: N/A;  . SKIN LESION EXCISION     [name]  . TONSILLECTOMY      Family History  Problem Relation Age of Onset  . Heart disease Mother        Valve replacement and pacemaker  . Colon cancer Father   . Asthma Unknown        cousin    Social History   Social History  . Marital status: Married    Spouse name: [name]  . Number of children: 3  . Years of education: 12+   Occupational History  . Retired     Immunologist   Social History Main Topics  . Smoking status: Former Smoker    Packs/day: 1.00    Years: 30.00    Types: Cigarettes    Quit date: 05/28/2006  . Smokeless tobacco: Never Used  . Alcohol use 0.0 oz/week     Comment: Rare beer or wine.  . Drug use: No  . Sexual activity: Yes    Birth control/ protection: None   Other Topics Concern  . None   Social History Narrative   Patient lives at home with [name].    Patient has 3 children.    Patient has his BS   Patient is retired.    Drinks about 2 cups of coffee per day.      [facility]:   He is still married. 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 He is from [place].             Objective:   Physical Exam BP 118/64 (BP Location: Left Arm, Cuff Size: Normal)   Pulse 77   Ht 5\' 10"  (1.778 m)   Wt 163 lb 6.4 oz (74.1 kg)   SpO2 97%   BMI 23.45 kg/m   General:  Awake. Alert. No acute distress. Elderly Caucasian male. Integument:  Warm & dry. No rash. No bruising. Extremities:  No cyanosis or clubbing.  HEENT: Minimal nasal turbinate swelling. No oral ulcers. No scleral icterus. Cardiovascular:  Regular rate. No edema. Regular rhythm.  Pulmonary:  Good aeration bilaterally. Clear with auscultation. Normal work of breathing on room  air. Abdomen: Soft. Normal bowel sounds. Nondistended. Musculoskeletal:  Normal bulk and tone. No joint deformity or effusion appreciated.  PFT 12/20/15: FVC 3.41 L (91%) FEV1 2.21 L (85%) FEV1/FVC 0.65 FEF 25-75 1.09 L (68%) negative bronchodilator response 07/13/15: FVC 3.63 L (96%) FEV1 2.40 L (92%) FEV1/FVC 0.66 FEF 25-75 1.17 L (71%) positive bronchodilator response TLC 8.07 L (112%) RV 143% ERV 106% DLCO corrected 75%  06/22/13:  FVC 3.74 L (89%) FEV1 2.39 L (77%) FEV1/FVC 0.64 FEF 25-75 1.17 L (46%)  6MWT 12/21/15:  Walked 360 meters / Baseline Sat 99% on RA / Nadir Sat 97% 07/13/15:  Walked 360 meters / Baseline Sat 97% on RA / Nadir Sat 97% on RA   POLYSOMNOGRAM (11/15/16):  PLMS index of 49.47. Supine AHI 12.5 events per hour. Lowest saturation 88%. Overall AHI 3.6 events/hr.   IMAGING CXR PA/LAT 11/13/16 (personally reviewed by me):  No developing mass or opacity. No pleural effusion. Heart normal in size & mediastinum normal in contour.  CXR PA/LAT 05/30/16 (previously reviewed by me):  Hyperinflation with flattening of the diaphragms. No focal opacity appreciated. No pleural effusion. Heart normal in size & mediastinum normal in contour.  CT CHEST W/O 04/19/15 (previously reviewed by me): Mild centrilobular emphysematous changes. Subcentimeter peripheral left upper lobe nodules noted. Small hiatal hernia. No pericardial effusion. No pleural effusion or thickening. Calcified nodules within spleen noted.  CT CHEST W/O 05/04/03 (previously reviewed by me): Mild cylindrical bronchiectasis with linear opacity particularly in the right lower lobe suggestive of scar formation. Some hilar node calcification especially on the left. 2 separate subcentimeter nodules within the inferior segment of the lingula one of which is at least partially calcified. No pleural effusion or thickening appreciated. No pericardial effusion.  MICROBIOLOGY Sputum AFB Ctx (11/14/16):  Pending Sputum Ctx (06/01/16):   Oral Flora / Fungus negative / AFB negative  Sputum Ctx (04/13/16):  Oral Flora  Sputum Ctx (11/09/15):  Oral Flora / Fungus negative / AFB negative Sputum Ctx (04/19/15): Oral Flora/AFB negative/Fungal negative  LABS 05/30/16 CBC:  8.9/12.3/38.5/267  04/11/15 Alpha-1 antitrypsin: MM (153) IgM: 31 IgA: 195 IgG: 822    Assessment & Plan:  83 y.o. male with bronchiectasis and GERD. Reviewing patient's previous x-ray imaging demonstrates no focal opacity. His sputum AFB is pending from June. Symptomatically he seems to be reasonably well-controlled with regards to his underlying bronchiectasis and has no symptoms from his reflux. We did briefly discuss his voice changes which could be due to some silent laryngo-esophageal reflux. We also discussed his periodic limb movement disorder and the patient wishes to defer further workup and evaluation by a sleep specialist at this time. I instructed the patient to contact my office if he had any new breathing problems or questions before his next appointment.  1. Chronic bronchitis/mild bronchiectasis:  Recommended continued use of hypertonic saline and flutter valve at least once daily. 2. GERD:  Minimal symptoms. No new medications. 3. Periodic limb movement disorder:  He wishes to cancel his appointment with Dr. Annamaria Boots.  4. Voice changes: Recommended contacting ENT for a follow-up appointment. 5. Health maintenance: Status post Pneumovax January 1999 & Prevnar July 2015. 6. Follow-up: Return to clinic in 6 months or sooner if needed.  Sonia Baller Ashok Cordia, M.D. [facility] Pager:  [phone] After 3pm or if no response, call [phone] 9:55 AM 12/26/16

## 2016-12-26 ENCOUNTER — Ambulatory Visit (INDEPENDENT_AMBULATORY_CARE_PROVIDER_SITE_OTHER): Payer: Medicare Other | Admitting: Pulmonary Disease

## 2016-12-26 ENCOUNTER — Encounter: Payer: Self-pay | Admitting: Pulmonary Disease

## 2016-12-26 VITALS — BP 118/64 | HR 77 | Ht 70.0 in | Wt 163.4 lb

## 2016-12-26 DIAGNOSIS — K219 Gastro-esophageal reflux disease without esophagitis: Secondary | ICD-10-CM

## 2016-12-26 DIAGNOSIS — J479 Bronchiectasis, uncomplicated: Secondary | ICD-10-CM

## 2016-12-26 NOTE — Patient Instructions (Addendum)
   Continue using your 3% hypertonic saline and flutter valve twice daily. At least do your flutter valve.  Remember to call Dr. Erik Obey for an appointment to address your sore throat & voice changes.  You can cancel your appointment with Dr. Annamaria Boots.   Call me if you have any new breathing problems or feel your cough is getting worse.

## 2016-12-31 LAB — AFB CULTURE WITH SMEAR (NOT AT ARMC)

## 2017-03-04 ENCOUNTER — Institutional Professional Consult (permissible substitution): Payer: Medicare Other | Admitting: Internal Medicine

## 2017-03-05 ENCOUNTER — Encounter: Payer: Self-pay | Admitting: Family Medicine

## 2017-03-05 ENCOUNTER — Ambulatory Visit (INDEPENDENT_AMBULATORY_CARE_PROVIDER_SITE_OTHER): Payer: Medicare Other | Admitting: Family Medicine

## 2017-03-05 VITALS — BP 97/63 | HR 72 | Temp 97.1°F | Ht 70.0 in | Wt 166.0 lb

## 2017-03-05 DIAGNOSIS — N400 Enlarged prostate without lower urinary tract symptoms: Secondary | ICD-10-CM

## 2017-03-05 DIAGNOSIS — Z23 Encounter for immunization: Secondary | ICD-10-CM | POA: Diagnosis not present

## 2017-03-05 DIAGNOSIS — I700 Atherosclerosis of aorta: Secondary | ICD-10-CM

## 2017-03-05 DIAGNOSIS — K219 Gastro-esophageal reflux disease without esophagitis: Secondary | ICD-10-CM | POA: Diagnosis not present

## 2017-03-05 DIAGNOSIS — J479 Bronchiectasis, uncomplicated: Secondary | ICD-10-CM

## 2017-03-05 DIAGNOSIS — R5383 Other fatigue: Secondary | ICD-10-CM

## 2017-03-05 DIAGNOSIS — L723 Sebaceous cyst: Secondary | ICD-10-CM

## 2017-03-05 DIAGNOSIS — E559 Vitamin D deficiency, unspecified: Secondary | ICD-10-CM

## 2017-03-05 DIAGNOSIS — K4090 Unilateral inguinal hernia, without obstruction or gangrene, not specified as recurrent: Secondary | ICD-10-CM | POA: Diagnosis not present

## 2017-03-05 DIAGNOSIS — J449 Chronic obstructive pulmonary disease, unspecified: Secondary | ICD-10-CM | POA: Diagnosis not present

## 2017-03-05 NOTE — Addendum Note (Signed)
Addended by: Zannie Cove on: 03/05/2017 11:53 AM   Modules accepted: Orders

## 2017-03-05 NOTE — Progress Notes (Signed)
Subjective:    Patient ID: Charles Golden, male    DOB: Sep 27, 1925, 83 y.o.   MRN: [id number]  HPI Pt here for follow up and management of chronic medical problems which includes gerd. He is taking medication regularly. This is an 83 year old patient who looks and acts much younger than his age. He has COPD atherosclerosis of the aorta BPH. Has a cyst on his back each we have 6 looked at in the past. Also has a right inguinal hernia. His vital signs are stable his weight is up 3 pounds. He will get his flu shot today. The patient also complains of decreased energy. The right inguinal hernia bothers him off and on he has seen the surgeon about this and was told by the surgeon that if he had increased pain that he should come back and see him. This is a recurrent hernia. The patient sees the pulmonologist about every 6 months and this is Dr. Ashok Cordia work. He sees Dr. Jeffie Pollock every 6-12 months for his BPH. He sees Dr. Erik Obey, his air nose and throat specialist periodically because of drainage and congestion. He denies any chest pain. He says his COPD and shortness of breath or stable. Currently his bowel movements are formed and there is no blood in them. He has nocturia 1-2 times at night.      Patient Active Problem List   Diagnosis Date Noted  . Restless leg syndrome 11/25/2016  . Thoracic aortic atherosclerosis ([place]) 05/03/2015  . Upper airway cough syndrome 04/28/2015  . GERD (gastroesophageal reflux disease) 04/11/2015  . Tinea corporis 02/15/2015  . Acute medial meniscal tear 07/20/2014  . COPD GOLD I  12/08/2013  . Small bowel volvulus ([place]) 08/05/2013  . Chronic headache 08/05/2013  . Left inguinal hernia 08/05/2013  . Incisional hernia, without obstruction or gangrene 08/05/2013  . Bronchiectasis without acute exacerbation ([place]) 06/22/2013  . Headache 05/01/2013  . Obstructive chronic bronchitis without exacerbation ([place]) 04/30/2013  . Left hip pain 04/29/2013  . BPH (benign  prostatic hyperplasia) 01/05/2013  . IBS (irritable bowel syndrome) 09/03/2012  . Chronic rhinosinusitis 09/03/2012  . Lung nodules 05/04/2003   Outpatient Encounter Prescriptions as of 03/05/2017  Medication Sig  . albuterol (PROVENTIL) (2.5 MG/3ML) 0.083% nebulizer solution Take 3 mLs (2.5 mg total) by nebulization every 6 (six) hours as needed for wheezing or shortness of breath.  Marland Kitchen aspirin EC 81 MG tablet Take 81 mg by mouth daily.  Marland Kitchen azelastine (ASTELIN) 0.1 % nasal spray PLACE 1 SPRAY INTO THE NOSE 1 DAY OR 1 DOSE. USE IN EACH NOSTRIL AS DIRECTED  . Cholecalciferol (VITAMIN D3) 2000 UNITS TABS Take 1 tablet by mouth daily.  . Cyanocobalamin (VITAMIN B-12) 5000 MCG SUBL Place 1 tablet under the tongue daily.  Marland Kitchen doxazosin (CARDURA) 8 MG tablet TAKE 1/2 TO 1 TABLET BY MOUTH DAILY  . finasteride (PROSCAR) 5 MG tablet Take 1 tablet by mouth daily.  . fluticasone (FLONASE) 50 MCG/ACT nasal spray 1 spray in each nostril qd  . guaiFENesin (MUCINEX) 600 MG 12 hr tablet Take 600 mg by mouth as needed. Reported on 05/25/2015  . latanoprost (XALATAN) 0.005 % ophthalmic solution Place 1 drop into the left eye at bedtime.  . Probiotic Product (ALIGN) 4 MG CAPS Take 1 tablet by mouth daily.  Marland Kitchen Respiratory Therapy Supplies (FLUTTER) DEVI 1 Device by Other route as needed.  . sodium chloride HYPERTONIC 3 % nebulizer solution Take by nebulization 2 (two) times daily.   No facility-administered  encounter medications on file as of 03/05/2017.      Review of Systems  Constitutional: Negative.   HENT: Negative.   Eyes: Negative.   Respiratory: Negative.   Cardiovascular: Negative.   Gastrointestinal: Negative.        F/u hernia right groin  Endocrine: Negative.   Genitourinary: Negative.   Musculoskeletal: Negative.   Skin: Negative.        Cyst on back - getting better   Allergic/Immunologic: Negative.   Neurological: Negative.   Hematological: Negative.   Psychiatric/Behavioral: Negative.         Objective:   Physical Exam  Constitutional: He is oriented to person, place, and time. He appears well-developed and well-nourished. No distress.  The patient is pleasant and alert and appears Xanax much younger than his age  HENT:  Head: Normocephalic and atraumatic.  Right Ear: External ear normal.  Left Ear: External ear normal.  Nose: Nose normal.  Mouth/Throat: Oropharynx is clear and moist. No oropharyngeal exudate.  The patient wears bilateral hearing aids  Eyes: Pupils are equal, round, and reactive to light. Conjunctivae and EOM are normal. Right eye exhibits no discharge. Left eye exhibits no discharge. No scleral icterus.  Neck: Normal range of motion. Neck supple. No thyromegaly present.  No bruits thyromegaly or anterior cervical adenopathy  Cardiovascular: Normal rate, regular rhythm, normal heart sounds and intact distal pulses.   No murmur heard. The heart is regular at 60/m  Pulmonary/Chest: Effort normal. No respiratory distress. He has wheezes. He has no rales. He exhibits no tenderness.  He has rhonchi with coughing The patient has a congested cough and expiratory wheezes, there is no axillary adenopathy  Abdominal: Soft. Bowel sounds are normal. He exhibits mass. There is no tenderness. There is no rebound and no guarding.  The patient has a large right inguinal hernia that is reducible but is protruding with standing.  Musculoskeletal: Normal range of motion. He exhibits no edema.  Lymphadenopathy:    He has no cervical adenopathy.  Neurological: He is alert and oriented to person, place, and time. He has normal reflexes. No cranial nerve deficit.  Skin: Skin is warm and dry. No rash noted.  There is a sebaceous cyst just to the left of the lower lumbar spine area which appears to be healing. We will not do any intervention unless he gets more inflamed because antibiotics aggravate his intestinal symptoms and cause diarrhea.  Psychiatric: He has a normal  mood and affect. His behavior is normal. Judgment and thought content normal.  Nursing note and vitals reviewed.   BP 97/63 (BP Location: Left Arm)   Pulse 72   Temp (!) 97.1 F (36.2 C) (Oral)   Ht _0  (1.778 m)   Wt 166 lb (75.3 kg)   BMI 23.82 kg/m        Assessment & Plan:  .1. Vitamin D deficiency -Continue current treatment pending results of lab work - CBC with Differential/Platelet - VITAMIN D 25 Hydroxy (Vit-D Deficiency, Fractures)  2. Thoracic aortic atherosclerosis ([place]) -Continue with aggressive therapeutic lifestyle changes including diet and exercise pending results of lab work - BMP8+EGFR - CBC with Differential/Platelet - Lipid panel  3. Benign prostatic hyperplasia, unspecified whether lower urinary tract symptoms present -Follow-up with urology as planned - CBC with Differential/Platelet  4. Gastroesophageal reflux disease, esophagitis presence not specified -Follow-up with gastroenterology as needed - BMP8+EGFR - CBC with Differential/Platelet - Hepatic function panel  5. Decreased energy -Check labs and make sure  thyroid tests is been checked within the past year  6. Right inguinal hernia -Follow-up with Dr. Arnoldo Morale if pain recurs or go to the emergency room  7. Obstructive chronic bronchitis without exacerbation ([place]) -Follow-up with Dr. Ashok Cordia as needed  8. Bronchiectasis without complication ([place]) -Follow-up with pulmonology as needed  9. Sebaceous cyst -If this gets more inflamed he will call us back and we will consider putting him on an antibiotic at that time otherwise watchful waiting.  Patient Instructions                       Medicare Annual Wellness Visit  [facility] and the medical providers at [facility] strive to bring you the best medical care.  In doing so we not only want to address your current medical conditions and concerns but also to detect new conditions early and prevent illness, disease  and health-related problems.    Medicare offers a yearly Wellness Visit which allows our clinical staff to assess your need for preventative services including immunizations, lifestyle education, counseling to decrease risk of preventable diseases and screening for fall risk and other medical concerns.    This visit is provided free of charge (no copay) for all Medicare recipients. The clinical pharmacists at [facility] have begun to conduct these Wellness Visits which will also include a thorough review of all your medications.    As you primary medical provider recommend that you make an appointment for your Annual Wellness Visit if you have not done so already this year.  You may set up this appointment before you leave today or you may call back ([phone]) and schedule an appointment.  Please make sure when you call that you mention that you are scheduling your Annual Wellness Visit with the clinical pharmacist so that the appointment may be made for the proper length of time.     Continue current medications. Continue good therapeutic lifestyle changes which include good diet and exercise. Fall precautions discussed with patient. If an FOBT was given today- please return it to our front desk. If you are over 11 years old - you may need Prevnar 27 or the adult Pneumonia vaccine.  **Flu shots are available--- please call and schedule a FLU-CLINIC appointment**  After your visit with [place] today you will receive a survey in the mail or online from [employer] regarding your care with [place]. Please take a moment to fill this out. Your feedback is very important to [place] as you can help [place] better understand your patient needs as well as improve your experience and satisfaction. WE CARE ABOUT YOU!!!  The patient is encouraged to use his nebulizer treatment with albuterol at least twice daily especially during this season of the year He should continue to drink plenty of fluids and  stay well hydrated He should keep follow-up appointments with his urologist, pulmonologist, and ear nose and throat specialist. He should continue with his Mucinex twice a day with a large glass of water He should avoid environments that will aggravate his breathing He should go to the emergency room immediately if he develops severe pain with his right inguinal hernia and follow-up with his surgeon that has seen him about this   Arrie Senate MD

## 2017-03-05 NOTE — Patient Instructions (Addendum)
Medicare Annual Wellness Visit  [facility] and the medical providers at [facility] strive to bring you the best medical care.  In doing so we not only want to address your current medical conditions and concerns but also to detect new conditions early and prevent illness, disease and health-related problems.    Medicare offers a yearly Wellness Visit which allows our clinical staff to assess your need for preventative services including immunizations, lifestyle education, counseling to decrease risk of preventable diseases and screening for fall risk and other medical concerns.    This visit is provided free of charge (no copay) for all Medicare recipients. The clinical pharmacists at [facility] have begun to conduct these Wellness Visits which will also include a thorough review of all your medications.    As you primary medical provider recommend that you make an appointment for your Annual Wellness Visit if you have not done so already this year.  You may set up this appointment before you leave today or you may call back ([phone]) and schedule an appointment.  Please make sure when you call that you mention that you are scheduling your Annual Wellness Visit with the clinical pharmacist so that the appointment may be made for the proper length of time.     Continue current medications. Continue good therapeutic lifestyle changes which include good diet and exercise. Fall precautions discussed with patient. If an FOBT was given today- please return it to our front desk. If you are over 31 years old - you may need Prevnar 68 or the adult Pneumonia vaccine.  **Flu shots are available--- please call and schedule a FLU-CLINIC appointment**  After your visit with [place] today you will receive a survey in the mail or online from [employer] regarding your care with [place]. Please take a moment to fill this out. Your feedback is very  important to [place] as you can help [place] better understand your patient needs as well as improve your experience and satisfaction. WE CARE ABOUT YOU!!!  The patient is encouraged to use his nebulizer treatment with albuterol at least twice daily especially during this season of the year He should continue to drink plenty of fluids and stay well hydrated He should keep follow-up appointments with his urologist, pulmonologist, and ear nose and throat specialist. He should continue with his Mucinex twice a day with a large glass of water He should avoid environments that will aggravate his breathing He should go to the emergency room immediately if he develops severe pain with his right inguinal hernia and follow-up with his surgeon that has seen him about this

## 2017-03-06 LAB — CBC WITH DIFFERENTIAL/PLATELET
Basophils Absolute: 0 10*3/uL (ref 0.0–0.2)
Basos: 0 %
EOS (ABSOLUTE): 0.3 10*3/uL (ref 0.0–0.4)
Eos: 4 %
HEMOGLOBIN: 13.9 g/dL (ref 13.0–17.7)
Hematocrit: 42.1 % (ref 37.5–51.0)
Immature Grans (Abs): 0 10*3/uL (ref 0.0–0.1)
Immature Granulocytes: 0 %
LYMPHS ABS: 2 10*3/uL (ref 0.7–3.1)
Lymphs: 27 %
MCH: 30.3 pg (ref 26.6–33.0)
MCHC: 33 g/dL (ref 31.5–35.7)
MCV: 92 fL (ref 79–97)
MONOCYTES: 7 %
MONOS ABS: 0.5 10*3/uL (ref 0.1–0.9)
NEUTROS ABS: 4.5 10*3/uL (ref 1.4–7.0)
Neutrophils: 62 %
Platelets: 260 10*3/uL (ref 150–379)
RBC: 4.58 x10E6/uL (ref 4.14–5.80)
RDW: 14.2 % (ref 12.3–15.4)
WBC: 7.3 10*3/uL (ref 3.4–10.8)

## 2017-03-06 LAB — BMP8+EGFR
BUN / CREAT RATIO: 15 (ref 10–24)
BUN: 19 mg/dL (ref 8–27)
CO2: 21 mmol/L (ref 20–29)
Calcium: 8.8 mg/dL (ref 8.6–10.2)
Chloride: 105 mmol/L (ref 96–106)
Creatinine, Ser: 1.31 mg/dL — ABNORMAL HIGH (ref 0.76–1.27)
GFR calc non Af Amer: 48 mL/min/{1.73_m2} — ABNORMAL LOW (ref >59)
GFR, EST AFRICAN AMERICAN: 55 mL/min/{1.73_m2} — AB (ref >59)
GLUCOSE: 95 mg/dL (ref 65–99)
POTASSIUM: 4.9 mmol/L (ref 3.5–5.2)
SODIUM: 142 mmol/L (ref 134–144)

## 2017-03-06 LAB — LIPID PANEL
Chol/HDL Ratio: 2.8 ratio (ref 0.0–5.0)
Cholesterol, Total: 136 mg/dL (ref 100–199)
HDL: 49 mg/dL (ref >39)
LDL CALC: 70 mg/dL (ref 0–99)
TRIGLYCERIDES: 86 mg/dL (ref 0–149)
VLDL Cholesterol Cal: 17 mg/dL (ref 5–40)

## 2017-03-06 LAB — HEPATIC FUNCTION PANEL
ALBUMIN: 3.8 g/dL (ref 3.5–4.7)
ALT: 9 IU/L (ref 0–44)
AST: 16 IU/L (ref 0–40)
Alkaline Phosphatase: 85 IU/L (ref 39–117)
BILIRUBIN TOTAL: 0.3 mg/dL (ref 0.0–1.2)
Bilirubin, Direct: 0.07 mg/dL (ref 0.00–0.40)
Total Protein: 6.2 g/dL (ref 6.0–8.5)

## 2017-03-06 LAB — VITAMIN D 25 HYDROXY (VIT D DEFICIENCY, FRACTURES): Vit D, 25-Hydroxy: 49.9 ng/mL (ref 30.0–100.0)

## 2017-03-07 ENCOUNTER — Telehealth: Payer: Self-pay | Admitting: Family Medicine

## 2017-03-07 LAB — THYROID PANEL WITH TSH
Free Thyroxine Index: 1.7 (ref 1.2–4.9)
T3 UPTAKE RATIO: 25 % (ref 24–39)
T4 TOTAL: 6.7 ug/dL (ref 4.5–12.0)
TSH: 2.46 u[IU]/mL (ref 0.450–4.500)

## 2017-03-07 LAB — SPECIMEN STATUS REPORT

## 2017-03-08 NOTE — Telephone Encounter (Signed)
lmtcb on lab report

## 2017-03-19 ENCOUNTER — Ambulatory Visit (INDEPENDENT_AMBULATORY_CARE_PROVIDER_SITE_OTHER): Payer: Medicare Other | Admitting: *Deleted

## 2017-03-19 ENCOUNTER — Ambulatory Visit: Payer: Medicare Other | Admitting: *Deleted

## 2017-03-19 ENCOUNTER — Encounter: Payer: Self-pay | Admitting: *Deleted

## 2017-03-19 VITALS — BP 107/56 | HR 62 | Ht 70.0 in | Wt 168.0 lb

## 2017-03-19 DIAGNOSIS — Z0000 Encounter for general adult medical examination without abnormal findings: Secondary | ICD-10-CM

## 2017-03-19 NOTE — Progress Notes (Addendum)
Subjective:   Charles Golden is a 83 y.o. male who presents for an Initial Medicare Annual Wellness Visit. Mr Aday is retired and lives at home with his wife. He has 3 adult daughters and multiple grandchildren.   Review of Systems  Reports that health is about the same as last year.   Cardiac Risk Factors include: advanced age (>15men, >72 women);sedentary lifestyle;male gender  Other systems negative today.   Objective:    Today's Vitals   03/19/17 0911  BP: (!) 107/56  Pulse: 62  Weight: 168 lb (76.2 kg)  Height: 5\' 10"  (1.778 m)   Body mass index is 24.11 kg/m.  Current Medications (verified) Outpatient Encounter Prescriptions as of 03/19/2017  Medication Sig  . albuterol (PROVENTIL) (2.5 MG/3ML) 0.083% nebulizer solution Take 3 mLs (2.5 mg total) by nebulization every 6 (six) hours as needed for wheezing or shortness of breath.  Marland Kitchen aspirin EC 81 MG tablet Take 81 mg by mouth daily.  Marland Kitchen azelastine (ASTELIN) 0.1 % nasal spray PLACE 1 SPRAY INTO THE NOSE 1 DAY OR 1 DOSE. USE IN EACH NOSTRIL AS DIRECTED  . Cholecalciferol (VITAMIN D3) 2000 UNITS TABS Take 1 tablet by mouth daily.  . Cyanocobalamin (VITAMIN B-12) 5000 MCG SUBL Place 1 tablet under the tongue daily.  Marland Kitchen doxazosin (CARDURA) 8 MG tablet TAKE 1/2 TO 1 TABLET BY MOUTH DAILY  . finasteride (PROSCAR) 5 MG tablet Take 1 tablet by mouth daily.  . fluticasone (FLONASE) 50 MCG/ACT nasal spray 1 spray in each nostril qd  . guaiFENesin (MUCINEX) 600 MG 12 hr tablet Take 600 mg by mouth as needed. Reported on 05/25/2015  . latanoprost (XALATAN) 0.005 % ophthalmic solution Place 1 drop into the left eye at bedtime.  . Probiotic Product (ALIGN) 4 MG CAPS Take 1 tablet by mouth daily.  Marland Kitchen Respiratory Therapy Supplies (FLUTTER) DEVI 1 Device by Other route as needed.  . sodium chloride HYPERTONIC 3 % nebulizer solution Take by nebulization 2 (two) times daily.   No facility-administered encounter medications on file as of  03/19/2017.     Allergies (verified) Demerol [meperidine]; Meperidine hcl; and Spiriva handihaler [tiotropium bromide monohydrate]   History: Past Medical History:  Diagnosis Date  . Bronchitis, chronic ([place])   . COPD (chronic obstructive pulmonary disease) ([place])   . Enlarged prostate   . GERD (gastroesophageal reflux disease)   . Glucagonoma   . Hernia, incisional    at present  . HOH (hard of hearing)   . IBS (irritable bowel syndrome)   . Sciatic pain   . Sinus congestion    Past Surgical History:  Procedure Laterality Date  . BOWEL RESECTION N/A 09/04/2012   Procedure: SMALL BOWEL RESECTION;  Surgeon: Donato Heinz, MD;  Location: [facility];  Service: General;  Laterality: N/A;  Anastimosis  . HEMORRHOID SURGERY    . HERNIA REPAIR Right 70's  . INGUINAL HERNIA REPAIR Left 09/14/2013   Procedure: LEFT INGUINAL HERNIORRHAPHY;  Surgeon: Jamesetta So, MD;  Location: [facility];  Service: General;  Laterality: Left;  . INSERTION OF MESH Left 09/14/2013   Procedure: INSERTION OF MESH;  Surgeon: Jamesetta So, MD;  Location: [facility];  Service: General;  Laterality: Left;  . KNEE ARTHROSCOPY Left 07/21/2014   Procedure: LEFT KNEE ARTHROSCOPY WITH MEDIAL MENISCAL DEBRIDEMENT ;  Surgeon: Gearlean Alf, MD;  Location: [facility];  Service: Orthopedics;  Laterality: Left;  . LAPAROTOMY N/A 09/04/2012   Procedure: EXPLORATORY LAPAROTOMY;  Surgeon: Ruby Cola  Dondra Prader, MD;  Location: [facility];  Service: General;  Laterality: N/A;  . SKIN LESION EXCISION     [name]  . TONSILLECTOMY     Family History  Problem Relation Age of Onset  . Heart disease Mother        Valve replacement and pacemaker  . Colon cancer Father   . Asthma Unknown        cousin  . Healthy Daughter   . Healthy Daughter   . Healthy Daughter    Social History   Occupational History  . Retired     Immunologist   Social History Main Topics  . Smoking status: Former Smoker    Packs/day: 1.00    Years: 30.00     Types: Cigarettes    Quit date: 05/28/2006  . Smokeless tobacco: Never Used  . Alcohol use 0.0 oz/week     Comment: Rare beer or wine.  . Drug use: No  . Sexual activity: Yes    Birth control/ protection: None   Tobacco Counseling No tobacco use  Activities of Daily Living In your present state of health, do you have any difficulty performing the following activities: 03/19/2017  Hearing? Y  Comment Uses hearing aids  Vision? N  Difficulty concentrating or making decisions? N  Walking or climbing stairs? N  Dressing or bathing? N  Doing errands, shopping? N  Preparing Food and eating ? N  Using the Toilet? N  In the past six months, have you accidently leaked urine? N  Do you have problems with loss of bowel control? N  Managing your Medications? N  Managing your Finances? N  Housekeeping or managing your Housekeeping? N  Some recent data might be hidden    Immunizations and Health Maintenance Immunization History  Administered Date(s) Administered  . Influenza, High Dose Seasonal PF 03/13/2016, 03/05/2017  . Influenza,inj,Quad PF,6+ Mos 03/20/2013, 03/08/2014, 03/15/2015  . Pneumococcal Conjugate-13 12/08/2013  . Pneumococcal Polysaccharide-23 05/28/1997  . Td 05/29/2007  . Tdap 11/05/2016  . Zoster 05/07/2006   There are no preventive care reminders to display for this patient.  Patient Care Team: Chipper Herb, MD as PCP - General (Family Medicine) Irine Seal, MD (Urology) Richmond Campbell, MD (Gastroenterology) Clent Jacks, MD (Ophthalmology) Elsie Stain, MD as Consulting Physician (Pulmonary Disease) Gaynelle Arabian, MD as Consulting Physician (Orthopedic Surgery) Michel Santee, MD as Consulting Physician (Neurology) Jodi Marble, MD as Consulting Physician (Otolaryngology)  No hospitalizations, ER visits, or surgeries this past year.     Assessment:   This is a routine wellness examination for [employer]   Hearing/Vision screen Hearing deficit  noted. Uses hearing aids. No problems with vision and eye exam is up to date.   Dietary issues and exercise activities discussed: Current Exercise Habits: The patient does not participate in regular exercise at present (Does some yardwork. Does not do any formal exercise), Exercise limited by: None identified   Diet: 3 meals a day and snacks  Goals    . Exercise 150 minutes per week (moderate activity)          Consider "Silver Sneakers" program      Depression Screen PHQ 2/9 Scores 03/19/2017 03/05/2017 11/05/2016 10/25/2016  PHQ - 2 Score 0 0 0 0  Exception Documentation - - - -    Fall Risk Fall Risk  03/19/2017 03/05/2017 11/05/2016 10/25/2016 08/27/2016  Falls in the past year? No No No No No  Comment - - - - -  Cognitive Function: MMSE - Mini Mental State Exam 03/19/2017 12/02/2014  Orientation to time 5 5  Orientation to Place 5 5  Registration 3 3  Attention/ Calculation 4 5  Recall 3 3  Language- name 2 objects 2 2  Language- repeat 1 1  Language- follow 3 step command 3 3  Language- read & follow direction 1 1  Write a sentence 1 1  Copy design 1 1  Total score 29 30        Screening Tests Health Maintenance  Topic Date Due  . TETANUS/TDAP  11/06/2026  . INFLUENZA VACCINE  Completed  . PNA vac Low Risk Adult  Completed        Plan:  Increase physical activity. Consider "Silver Sneakers" program Review advance directives. Bring a signed/notarized copy to our office.  Keep f/u with PCP   I have personally reviewed and noted the following in the patient's chart:   . Medical and social history . Use of alcohol, tobacco or illicit drugs  . Current medications and supplements . Functional ability and status . Nutritional status . Physical activity . Advanced directives . List of other physicians . Hospitalizations, surgeries, and ER visits in previous 12 months . Vitals . Screenings to include cognitive, depression, and falls . Referrals and  appointments  In addition, I have reviewed and discussed with patient certain preventive protocols, quality metrics, and best practice recommendations. A written personalized care plan for preventive services as well as general preventive health recommendations were provided to patient.     Chong Sicilian, RN  03/19/2017   I have reviewed and agree with the above AWV documentation.   Arrie Senate MD

## 2017-03-19 NOTE — Patient Instructions (Signed)
  [name] , Thank you for taking time to come for your Medicare Wellness Visit. I appreciate your ongoing commitment to your health goals. Please review the following plan we discussed and let me know if I can assist you in the future.   These are the goals we discussed: Goals    . Exercise 150 minutes per week (moderate activity)          Consider "Silver Sneakers" program       This is a list of the screening recommended for you and due dates:  Health Maintenance  Topic Date Due  . Tetanus Vaccine  11/06/2026  . Flu Shot  Completed  . Pneumonia vaccines  Completed   Review Advance Directives with family and bring a signed/notarized copy to our office.

## 2017-03-29 ENCOUNTER — Other Ambulatory Visit: Payer: Medicare Other

## 2017-03-29 ENCOUNTER — Ambulatory Visit (INDEPENDENT_AMBULATORY_CARE_PROVIDER_SITE_OTHER): Payer: Medicare Other | Admitting: Pulmonary Disease

## 2017-03-29 ENCOUNTER — Encounter: Payer: Self-pay | Admitting: Pulmonary Disease

## 2017-03-29 VITALS — BP 116/68 | HR 73 | Ht 70.0 in | Wt 169.0 lb

## 2017-03-29 DIAGNOSIS — J471 Bronchiectasis with (acute) exacerbation: Secondary | ICD-10-CM

## 2017-03-29 DIAGNOSIS — R05 Cough: Secondary | ICD-10-CM | POA: Diagnosis not present

## 2017-03-29 DIAGNOSIS — J479 Bronchiectasis, uncomplicated: Secondary | ICD-10-CM | POA: Diagnosis not present

## 2017-03-29 DIAGNOSIS — R059 Cough, unspecified: Secondary | ICD-10-CM

## 2017-03-29 MED ORDER — PREDNISONE 20 MG PO TABS: 40.0000 mg | ORAL_TABLET | Freq: Every day | ORAL | 0 refills | Status: DC

## 2017-03-29 MED ORDER — AZITHROMYCIN 250 MG PO TABS: ORAL_TABLET | ORAL | 0 refills | Status: DC

## 2017-03-29 NOTE — Patient Instructions (Addendum)
   Use your Mucinex twice daily for now while you're ill.  Use your Flutter valve at least twice daily to help clear out the mucus from your lungs.  Make sure you drop off the sputum sample in the cup we gave you today to the lab within 4 hours and do not refrigerate it.  I'm sending in prescriptions for Prednisone & a Z-pak to your pharmacy.  Call or e-mail me if you aren't getting better after a couple of days.   TESTS ORDERED: 1. Sputum Culture for AFB, Fungus, & Routine Bacteria.

## 2017-03-29 NOTE — Progress Notes (Addendum)
Subjective:    Patient ID: Charles Golden, male    DOB: Apr 13, 1926, 83 y.o.   MRN: [id number]  [facility]  Acute visit for cough with known Chronic Bronchitis/Mild Bronchiectasis & GERD.  HPI  Cough:  He reports starting Sunday he developed a cough. All week he is producing copious mucus. He is producing a "yellow" mucus with some clear mucus as well. No recent sick contacts but was in office on 10/23 for a wellness checkup.   Chronic bronchitis/mild bronchiectasis: Last treated in our clinic in June for acute exacerbation with prednisone and Augmentin. Prescribed 3% hypertonic saline and flutter valve for airway clearance. Recommended to do this procedure at least once daily. He reports he has been using his flutter valve more frequently.   GERD: Previously using Rolaids for intermittent reflux. Prior to that was on Zantac. No reflux or dyspepsia.   Review of Systems  He reports he has had increased headaches. Increased fatigue. He reports a sore throat. He isn't sure the sore throat preceded his symptoms. He denies any fever, chills, or sweats. He reports increased sinus congestion & drainage lately.  Allergies  Allergen Reactions  . Demerol [Meperidine] Other (See Comments)    Pt. States "he woke up during a colonoscopy"  . Spiriva Handihaler [Tiotropium Bromide Monohydrate] Other (See Comments)    Mild Urinary Retention    Current Outpatient Prescriptions on File Prior to Visit  Medication Sig Dispense Refill  . albuterol (PROVENTIL) (2.5 MG/3ML) 0.083% nebulizer solution Take 3 mLs (2.5 mg total) by nebulization every 6 (six) hours as needed for wheezing or shortness of breath. 75 mL 3  . aspirin EC 81 MG tablet Take 81 mg by mouth daily.    Marland Kitchen azelastine (ASTELIN) 0.1 % nasal spray PLACE 1 SPRAY INTO THE NOSE 1 DAY OR 1 DOSE. USE IN EACH NOSTRIL AS DIRECTED 30 mL 11  . Cholecalciferol (VITAMIN D3) 2000 UNITS TABS Take 1 tablet by mouth daily.    . Cyanocobalamin (VITAMIN B-12) 5000 MCG  SUBL Place 1 tablet under the tongue daily.    Marland Kitchen doxazosin (CARDURA) 8 MG tablet TAKE 1/2 TO 1 TABLET BY MOUTH DAILY 30 tablet 4  . finasteride (PROSCAR) 5 MG tablet Take 1 tablet by mouth daily.  10  . fluticasone (FLONASE) 50 MCG/ACT nasal spray 1 spray in each nostril qd 16 g 11  . guaiFENesin (MUCINEX) 600 MG 12 hr tablet Take 600 mg by mouth as needed. Reported on 05/25/2015    . latanoprost (XALATAN) 0.005 % ophthalmic solution Place 1 drop into the left eye at bedtime. 2.5 mL 1  . Probiotic Product (ALIGN) 4 MG CAPS Take 1 tablet by mouth daily.    Marland Kitchen Respiratory Therapy Supplies (FLUTTER) DEVI 1 Device by Other route as needed. 1 each 0  . sodium chloride HYPERTONIC 3 % nebulizer solution Take by nebulization 2 (two) times daily. 750 mL 3   No current facility-administered medications on file prior to visit.     Past Medical History:  Diagnosis Date  . Bronchitis, chronic ([place])   . COPD (chronic obstructive pulmonary disease) ([place])   . Enlarged prostate   . GERD (gastroesophageal reflux disease)   . Glucagonoma   . Hernia, incisional    at present  . HOH (hard of hearing)   . IBS (irritable bowel syndrome)   . Sciatic pain   . Sinus congestion     Past Surgical History:  Procedure Laterality Date  . BOWEL RESECTION  N/A 09/04/2012   Procedure: SMALL BOWEL RESECTION;  Surgeon: Donato Heinz, MD;  Location: [facility];  Service: General;  Laterality: N/A;  Anastimosis  . HEMORRHOID SURGERY    . HERNIA REPAIR Right 70's  . INGUINAL HERNIA REPAIR Left 09/14/2013   Procedure: LEFT INGUINAL HERNIORRHAPHY;  Surgeon: Jamesetta So, MD;  Location: [facility];  Service: General;  Laterality: Left;  . INSERTION OF MESH Left 09/14/2013   Procedure: INSERTION OF MESH;  Surgeon: Jamesetta So, MD;  Location[facility];  Service: General;  Laterality: Left;  . KNEE ARTHROSCOPY Left 07/21/2014   Procedure: LEFT KNEE ARTHROSCOPY WITH MEDIAL MENISCAL DEBRIDEMENT ;  Surgeon: Gearlean Alf, MD;   [facility];  Service: Orthopedics;  Laterality: Left;  . LAPAROTOMY N/A 09/04/2012   Procedure: EXPLORATORY LAPAROTOMY;  Surgeon: Donato Heinz, MD;  Location: [facility];  Service: General;  Laterality: N/A;  . SKIN LESION EXCISION     [name]  . TONSILLECTOMY      Family History  Problem Relation Age of Onset  . Heart disease Mother        Valve replacement and pacemaker  . Colon cancer Father   . Asthma Unknown        cousin  . Healthy Daughter   . Healthy Daughter   . Healthy Daughter     Social History   Social History  . Marital status: Married    Spouse name: [name]  . Number of children: 3  . Years of education: 12+   Occupational History  . Retired     Immunologist   Social History Main Topics  . Smoking status: Former Smoker    Packs/day: 1.00    Years: 30.00    Types: Cigarettes    Quit date: 05/28/2006  . Smokeless tobacco: Never Used  . Alcohol use 0.0 oz/week     Comment: Rare beer or wine.  . Drug use: No  . Sexual activity: Yes    Birth control/ protection: None   Other Topics Concern  . None   Social History Narrative   Patient lives at home with [name].    Patient has 3 children.    Patient has his BS   Patient is retired.    Drinks about 2 cups of coffee per day.      [facility]:   He is still married. 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 He is from [place].             Objective:   Physical Exam BP 116/68 (BP Location: Right Arm, Cuff Size: Normal)   Pulse 73   Ht 5\' 10"  (1.778 m)   Wt 169 lb (76.7 kg)   SpO2 97%   BMI 24.25 kg/m   General:  Awake. No distress. Comfortable. Accompanied by wife today. Integument:  Warm & dry. No rash on exposed skin. No bruising. Extremities:  No cyanosis or clubbing.  HEENT:  Moist mucus membranes. No nasal turbinate swelling. No scleral icterus. Cardiovascular:  Regular rate. No JVD. No  edema.  Pulmonary:  Coarse breath sounds bilaterally with mild intermittent wheeze. Normal work of breathing on room air. Abdomen: Soft. Normal bowel sounds. Nondistended.  Musculoskeletal:  Normal bulk and tone. No joint deformity or effusion appreciated. Neurological:  Cranial nerves 2-12 grossly in tact. Moving all 4 extremities equally.   PFT 12/20/15: FVC 3.41 L (91%) FEV1  2.21 L (85%) FEV1/FVC 0.65 FEF 25-75 1.09 L (68%) negative bronchodilator response 07/13/15: FVC 3.63 L (96%) FEV1 2.40 L (92%) FEV1/FVC 0.66 FEF 25-75 1.17 L (71%) positive bronchodilator response TLC 8.07 L (112%) RV 143% ERV 106% DLCO corrected 75%  06/22/13: FVC 3.74 L (89%) FEV1 2.39 L (77%) FEV1/FVC 0.64 FEF 25-75 1.17 L (46%)  6MWT 12/21/15:  Walked 360 meters / Baseline Sat 99% on RA / Nadir Sat 97% 07/13/15:  Walked 360 meters / Baseline Sat 97% on RA / Nadir Sat 97% on RA   POLYSOMNOGRAM (11/15/16):  PLMS index of 49.47. Supine AHI 12.5 events per hour. Lowest saturation 88%. Overall AHI 3.6 events/hr.   IMAGING CXR PA/LAT 11/13/16 (previously reviewed by me):  No developing mass or opacity. No pleural effusion. Heart normal in size & mediastinum normal in contour.  CXR PA/LAT 05/30/16 (previously reviewed by me):  Hyperinflation with flattening of the diaphragms. No focal opacity appreciated. No pleural effusion. Heart normal in size & mediastinum normal in contour.  CT CHEST W/O 04/19/15 (previously reviewed by me): Mild centrilobular emphysematous changes. Subcentimeter peripheral left upper lobe nodules noted. Small hiatal hernia. No pericardial effusion. No pleural effusion or thickening. Calcified nodules within spleen noted.  CT CHEST W/O 05/04/03 (previously reviewed by me): Mild cylindrical bronchiectasis with linear opacity particularly in the right lower lobe suggestive of scar formation. Some hilar node calcification especially on the left. 2 separate subcentimeter nodules within the inferior segment  of the lingula one of which is at least partially calcified. No pleural effusion or thickening appreciated. No pericardial effusion.  MICROBIOLOGY Sputum AFB Ctx (11/14/16):  Negative  Sputum Ctx (06/01/16):  Oral Flora / Fungus negative / AFB negative  Sputum Ctx (04/13/16):  Oral Flora  Sputum Ctx (11/09/15):  Oral Flora / Fungus negative / AFB negative Sputum Ctx (04/19/15): Oral Flora/AFB negative/Fungal negative  LABS 05/30/16 CBC:  8.9/12.3/38.5/267  04/11/15 Alpha-1 antitrypsin: MM (153) IgM: 31 IgA: 195 IgG: 822    Assessment & Plan:  83 y.o. male with mild bronchiectasis and minimal reflux. Known history of chronic bronchitis. Previous spirometry showed only mild airway obstruction and CT imaging showed only mild bronchiectasis previously. Patient's previous sputum cultures have not yielded any atypical organisms. His reflux appears to be minimally significant and not contributing to his current productive cough. I instructed the patient to notify me if he was not improving symptomatically by the start of the week.   1. Chronic bronchitis/mild bronchiectasis w/ mild exacerbation: Prescribing prednisone 40 mg daily 5 days & a Z-Pak. Patient to continue using his flutter valve at least twice daily for airway clearance and increase his Mucinex to twice daily. Checking sputum culture for AFB, fungus, and bacteria. 2. GERD: Continuing as needed over-the-counter medications.  3. Health maintenance: Status post influenza vaccine October 2018, Pneumovax January 1999 & Prevnar July 2015. 4. Follow-up: Return to clinic in 3 months or sooner if needed.   Sonia Baller Ashok Cordia, M.D. [facility] Pager:  [phone] After 3pm or if no response, call [phone] 2:11 PM 03/29/17

## 2017-03-31 LAB — RESPIRATORY CULTURE OR RESPIRATORY AND SPUTUM CULTURE
MICRO NUMBER: 81232947
RESULT: NORMAL
SPECIMEN QUALITY: ADEQUATE

## 2017-04-04 ENCOUNTER — Telehealth: Payer: Self-pay | Admitting: Pulmonary Disease

## 2017-04-04 MED ORDER — DOXYCYCLINE HYCLATE 100 MG PO TABS: 100.0000 mg | ORAL_TABLET | Freq: Two times a day (BID) | ORAL | 0 refills | Status: DC

## 2017-04-04 NOTE — Telephone Encounter (Signed)
I called and spoke with the pt's spouse, [name] and notified of recs per JN. She verbalized understanding and repeated the instructions back to me  Rx was sent to [facility]

## 2017-04-04 NOTE — Telephone Encounter (Signed)
Pt was seen for AWV 03/19/17 and was made aware of results.

## 2017-04-04 NOTE — Addendum Note (Signed)
Addended by: Rosana Berger on: 04/04/2017 03:42 PM   Modules accepted: Orders

## 2017-04-04 NOTE — Telephone Encounter (Signed)
Patient still is having cough producing a yellow mucus with wheezing. Has completed his course of Azithromycin & Prednisone. Please send in a prescription for Doxycycline 100mg  po bid - #20 - no refills. Remind the patient not to take the medication with any dairy products. He should take the pill with a full glass of water & remain upright for 1 hour afterward. Please send to the [facility] in [place]. Thanks.

## 2017-04-22 ENCOUNTER — Ambulatory Visit (INDEPENDENT_AMBULATORY_CARE_PROVIDER_SITE_OTHER): Payer: Medicare Other

## 2017-04-22 ENCOUNTER — Encounter: Payer: Self-pay | Admitting: Family

## 2017-04-22 ENCOUNTER — Ambulatory Visit (INDEPENDENT_AMBULATORY_CARE_PROVIDER_SITE_OTHER): Payer: Medicare Other | Admitting: Family

## 2017-04-22 VITALS — BP 117/56 | HR 76 | Temp 97.2°F | Ht 70.0 in | Wt 174.6 lb

## 2017-04-22 DIAGNOSIS — M778 Other enthesopathies, not elsewhere classified: Secondary | ICD-10-CM

## 2017-04-22 DIAGNOSIS — M7581 Other shoulder lesions, right shoulder: Secondary | ICD-10-CM | POA: Diagnosis not present

## 2017-04-22 DIAGNOSIS — M25511 Pain in right shoulder: Secondary | ICD-10-CM

## 2017-04-22 DIAGNOSIS — Y92009 Unspecified place in unspecified non-institutional (private) residence as the place of occurrence of the external cause: Secondary | ICD-10-CM

## 2017-04-22 DIAGNOSIS — S4991XA Unspecified injury of right shoulder and upper arm, initial encounter: Secondary | ICD-10-CM | POA: Diagnosis not present

## 2017-04-22 DIAGNOSIS — W19XXXA Unspecified fall, initial encounter: Secondary | ICD-10-CM

## 2017-04-22 MED ORDER — NAPROXEN 250 MG PO TABS: 250.0000 mg | ORAL_TABLET | Freq: Two times a day (BID) | ORAL | 0 refills | Status: DC

## 2017-04-22 NOTE — Patient Instructions (Signed)

## 2017-04-22 NOTE — Progress Notes (Signed)
   Subjective:    Patient ID: Charles Golden, male    DOB: [birth year], 83 y.o.   MRN: [id number]  Fall  Pertinent negatives include no numbness or tingling.  Shoulder Injury   The right shoulder is affected. The incident occurred more than 1 week ago. The injury mechanism was a fall. The quality of the pain is described as aching. The pain radiates to the chest. The pain is at a severity of 9/10. The pain is moderate. Pertinent negatives include no numbness or tingling. The symptoms are aggravated by movement and palpation. He has tried acetaminophen, non-weight bearing and NSAIDs for the symptoms. The treatment provided mild relief.      Review of Systems  Musculoskeletal:       Right shoulder pain   Neurological: Negative for tingling and numbness.  All other systems reviewed and are negative.      Objective:   Physical Exam  Constitutional: He is oriented to person, place, and time. He appears well-developed and well-nourished. No distress.  HENT:  Head: Normocephalic.  Eyes: Pupils are equal, round, and reactive to light. Right eye exhibits no discharge. Left eye exhibits no discharge.  Neck: Normal range of motion. Neck supple. No thyromegaly present.  Cardiovascular: Normal rate, regular rhythm, normal heart sounds and intact distal pulses.  No murmur heard. Pulmonary/Chest: Effort normal and breath sounds normal. No respiratory distress. He has no wheezes.  Abdominal: Soft. Bowel sounds are normal. He exhibits no distension. There is no tenderness.  Musculoskeletal: He exhibits tenderness. He exhibits no edema.  Pain in right shoulder with abduction, full ROM, with tenderness radiating down into right axillary area  Neurological: He is alert and oriented to person, place, and time.  Skin: Skin is warm and dry. No rash noted. No erythema.  Psychiatric: He has a normal mood and affect. His behavior is normal. Judgment and thought content normal.  Vitals reviewed.   Shoulder  x-ray- Negative for fracture Preliminary reading by Evelina Dun, FNP [facility]   BP (!) 117/56   Pulse 76   Temp (!) 97.2 F (36.2 C) (Oral)   Ht 5\' 10"  (1.778 m)   Wt 174 lb 9.6 oz (79.2 kg)   BMI 25.05 kg/m      Assessment & Plan:  1. Acute pain of right shoulder - DG Shoulder Right; Future - naproxen (NAPROSYN) 250 MG tablet; Take 1 tablet (250 mg total) by mouth 2 (two) times daily with a meal.  Dispense: 14 tablet; Refill: 0  2. Fall in home, initial encounter - DG Shoulder Right; Future - naproxen (NAPROSYN) 250 MG tablet; Take 1 tablet (250 mg total) by mouth 2 (two) times daily with a meal.  Dispense: 14 tablet; Refill: 0  3. Shoulder tendonitis, right - DG Shoulder Right; Future - naproxen (NAPROSYN) 250 MG tablet; Take 1 tablet (250 mg total) by mouth 2 (two) times daily with a meal.  Dispense: 14 tablet; Refill: 0  Rest Ice ROM exercises discussed Take Naprosyn BID with food- No other NSAID's for 7 days If pain does not improve may need to send for [place] to rule out tear RTO prn and keep appts with PCP  nn   Evelina Dun, FNP

## 2017-04-29 LAB — FUNGUS CULTURE W SMEAR
MICRO NUMBER: 81232946
SMEAR: NONE SEEN
SPECIMEN QUALITY:: ADEQUATE

## 2017-05-09 DIAGNOSIS — H401122 Primary open-angle glaucoma, left eye, moderate stage: Secondary | ICD-10-CM | POA: Diagnosis not present

## 2017-05-09 DIAGNOSIS — Z961 Presence of intraocular lens: Secondary | ICD-10-CM | POA: Diagnosis not present

## 2017-05-09 DIAGNOSIS — H40051 Ocular hypertension, right eye: Secondary | ICD-10-CM | POA: Diagnosis not present

## 2017-05-18 LAB — MYCOBACTERIA,CULT W/FLUOROCHROME SMEAR
MICRO NUMBER:: 81232945
SMEAR:: NONE SEEN
SPECIMEN QUALITY: ADEQUATE

## 2017-06-26 ENCOUNTER — Ambulatory Visit (INDEPENDENT_AMBULATORY_CARE_PROVIDER_SITE_OTHER): Payer: Medicare Other | Admitting: Pulmonary Disease

## 2017-06-26 ENCOUNTER — Encounter: Payer: Self-pay | Admitting: Pulmonary Disease

## 2017-06-26 DIAGNOSIS — K219 Gastro-esophageal reflux disease without esophagitis: Secondary | ICD-10-CM | POA: Diagnosis not present

## 2017-06-26 DIAGNOSIS — J479 Bronchiectasis, uncomplicated: Secondary | ICD-10-CM

## 2017-06-26 MED ORDER — SODIUM CHLORIDE 3 % IN NEBU: INHALATION_SOLUTION | RESPIRATORY_TRACT | 3 refills | Status: DC | PRN

## 2017-06-26 MED ORDER — ALBUTEROL SULFATE (2.5 MG/3ML) 0.083% IN NEBU: 2.5000 mg | INHALATION_SOLUTION | Freq: Four times a day (QID) | RESPIRATORY_TRACT | 3 refills | Status: DC | PRN

## 2017-06-26 NOTE — Progress Notes (Signed)
Subjective:   PATIENT ID: Charles Golden GENDER: male DOB: 1925/10/23, MRN: [id number]  Synopsis: Former patient of Dr. Ashok Cordia with bronchiectasis and GERD.  He had pneumonia that hospitalized him as a child.   HPI  Chief Complaint  Patient presents with  . Follow-up    Pt has congestion, prod cough-clear some yellow. Pt has wheezing SOB   [name] is here to establish care with me.  He has bronchiectasis and he says that he has chest congestion all the time.  He says that he frequently takes antibiotics, but they often give him diarrhea.  He says that the diarrhea cleared up until last night.  He has been taking budesonide for his diarrhea which helps.    He says that he coughs up clear mucus on a day to day basis.  Some days it is yellow in color.  He has been treated by Drs. Young, Advice worker and [employer].  He says that over the years he has been diagnosed with bronchitis and COPD.  He smoked a pack a day for "a long time", quit in 2008 after starting smoking in his late 20's.    He was hospitalized at age 68 and was cared for Dr. Aubery Lapping for "double pneumonia".  He says that he was hospitalized on "VJ day" in 1945.    He started complaining of respiratory problems 10-15.  He takes mucinex regularly twice a day. He uses a flutter valve regularly and he does albuterol as needed.    Past Medical History:  Diagnosis Date  . Bronchitis, chronic ([place])   . COPD (chronic obstructive pulmonary disease) ([place])   . Enlarged prostate   . GERD (gastroesophageal reflux disease)   . Glucagonoma   . Hernia, incisional    at present  . HOH (hard of hearing)   . IBS (irritable bowel syndrome)   . Sciatic pain   . Sinus congestion      Family History  Problem Relation Age of Onset  . Heart disease Mother        Valve replacement and pacemaker  . Colon cancer Father   . Asthma Unknown        cousin  . Healthy Daughter   . Healthy Daughter   . Healthy Daughter      Social History    Socioeconomic History  . Marital status: Married    Spouse name: Charles Golden  . Number of children: 3  . Years of education: 12+  . Highest education level: Not on file  Social Needs  . Financial resource strain: Not on file  . Food insecurity - worry: Not on file  . Food insecurity - inability: Not on file  . Transportation needs - medical: Not on file  . Transportation needs - non-medical: Not on file  Occupational History  . Occupation: Retired    Comment: Immunologist  Tobacco Use  . Smoking status: Former Smoker    Packs/day: 1.00    Years: 30.00    Pack years: 30.00    Types: Cigarettes    Last attempt to quit: 05/28/2006    Years since quitting: 11.0  . Smokeless tobacco: Never Used  Substance and Sexual Activity  . Alcohol use: Yes    Alcohol/week: 0.0 oz    Comment: Rare beer or wine.  . Drug use: No  . Sexual activity: Yes    Birth control/protection: None  Other Topics Concern  . Not on file  Social History Narrative  Patient lives at home with [name].    Patient has 3 children.    Patient has his BS   Patient is retired.    Drinks about 2 cups of coffee per day.      [facility]:   He is still married. 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 He is from [place].         Allergies  Allergen Reactions  . Demerol [Meperidine] Other (See Comments)    Pt. States "he woke up during a colonoscopy"  . Spiriva Handihaler [Tiotropium Bromide Monohydrate] Other (See Comments)    Mild Urinary Retention     Outpatient Medications Prior to Visit  Medication Sig Dispense Refill  . albuterol (PROVENTIL) (2.5 MG/3ML) 0.083% nebulizer solution Take 3 mLs (2.5 mg total) by nebulization every 6 (six) hours as needed for wheezing or shortness of breath. 75 mL 3  . aspirin EC 81 MG tablet Take 81 mg by mouth daily.    Marland Kitchen azelastine (ASTELIN) 0.1 % nasal spray PLACE 1 SPRAY INTO THE  NOSE 1 DAY OR 1 DOSE. USE IN EACH NOSTRIL AS DIRECTED 30 mL 11  . Cholecalciferol (VITAMIN D3) 2000 UNITS TABS Take 1 tablet by mouth daily.    . Cyanocobalamin (VITAMIN B-12) 5000 MCG SUBL Place 1 tablet under the tongue daily.    Marland Kitchen doxazosin (CARDURA) 8 MG tablet TAKE 1/2 TO 1 TABLET BY MOUTH DAILY 30 tablet 4  . finasteride (PROSCAR) 5 MG tablet Take 1 tablet by mouth daily.  10  . fluticasone (FLONASE) 50 MCG/ACT nasal spray 1 spray in each nostril qd 16 g 11  . guaiFENesin (MUCINEX) 600 MG 12 hr tablet Take 600 mg by mouth as needed. Reported on 05/25/2015    . latanoprost (XALATAN) 0.005 % ophthalmic solution Place 1 drop into the left eye at bedtime. 2.5 mL 1  . Probiotic Product (ALIGN) 4 MG CAPS Take 1 tablet by mouth daily.    Marland Kitchen Respiratory Therapy Supplies (FLUTTER) DEVI 1 Device by Other route as needed. 1 each 0  . sodium chloride HYPERTONIC 3 % nebulizer solution Take by nebulization 2 (two) times daily. 750 mL 3  . naproxen (NAPROSYN) 250 MG tablet Take 1 tablet (250 mg total) by mouth 2 (two) times daily with a meal. (Patient not taking: Reported on 06/26/2017) 14 tablet 0   No facility-administered medications prior to visit.     Review of Systems  Constitutional: Negative for chills and malaise/fatigue.  HENT: Negative for congestion and nosebleeds.   Respiratory: Positive for cough and sputum production. Negative for shortness of breath, wheezing and stridor.   Cardiovascular: Negative for chest pain, claudication and leg swelling.  Neurological: Negative for weakness.      Objective:  Physical Exam   Vitals:   06/26/17 1531  BP: 100/62  Pulse: 82  SpO2: 95%  Weight: 171 lb 12.8 oz (77.9 kg)  Height: 5\' 10"  (1.778 m)    Gen: well appearing HENT: OP clear, TM's clear, neck supple PULM: CTA B, normal percussion CV: RRR, no mgr, trace edema GI: BS+, soft, nontender Derm: no cyanosis or rash Psyche: normal mood and affect   CBC    Component Value  Date/Time   WBC 7.3 03/05/2017 0846   WBC 7.5 08/23/2014 0937   WBC 8.2 07/16/2014 1400   RBC 4.58 03/05/2017 0846   RBC 4.68 (A) 08/23/2014 0937   RBC 4.50  07/16/2014 1400   HGB 13.9 03/05/2017 0846   HCT 42.1 03/05/2017 0846   PLT 260 03/05/2017 0846   MCV 92 03/05/2017 0846   MCH 30.3 03/05/2017 0846   MCH 28.4 08/23/2014 0937   MCH 30.4 07/16/2014 1400   MCHC 33.0 03/05/2017 0846   MCHC 30.9 (A) 08/23/2014 0937   MCHC 31.9 07/16/2014 1400   RDW 14.2 03/05/2017 0846   LYMPHSABS 2.0 03/05/2017 0846   MONOABS 0.6 09/09/2013 1031   EOSABS 0.3 03/05/2017 0846   BASOSABS 0.0 03/05/2017 0846     PFT 12/20/15: FVC 3.41 L (91%) FEV1 2.21 L (85%) FEV1/FVC 0.65 FEF 25-75 1.09 L (68%) negative bronchodilator response 07/13/15: FVC 3.63 L (96%) FEV1 2.40 L (92%) FEV1/FVC 0.66 FEF 25-75 1.17 L (71%) positive bronchodilator response TLC 8.07 L (112%) RV 143% ERV 106% DLCO corrected 75%  06/22/13: FVC 3.74 L (89%) FEV1 2.39 L (77%) FEV1/FVC 0.64 FEF 25-75 1.17 L (46%)  6MWT 12/21/15:  Walked 360 meters / Baseline Sat 99% on RA / Nadir Sat 97% 07/13/15:  Walked 360 meters / Baseline Sat 97% on RA / Nadir Sat 97% on RA   POLYSOMNOGRAM (11/15/16):  PLMS index of 49.47. Supine AHI 12.5 events per hour. Lowest saturation 88%. Overall AHI 3.6 events/hr.   IMAGING CT CHEST W/O 04/19/15: images reviewed: mild cylindrical bronchiectasis in bases, calcified mediastinal nodes   MICROBIOLOGY 03/2017 Sputum culture > neg Sputum AFB Ctx (11/14/16):  Negative Sputum Ctx (06/01/16):  Oral Flora / Fungus negative / AFB negative  Sputum Ctx (04/13/16):  Oral Flora  Sputum Ctx (11/09/15):  Oral Flora / Fungus negative / AFB negative Sputum Ctx (04/19/15): Oral Flora/AFB negative/Fungal negative  LABS 05/30/16 CBC:  8.9/12.3/38.5/267  04/11/15 Alpha-1 antitrypsin: MM (153) IgM: 31 IgA: 195 IgG: 822        Assessment & Plan:   Bronchiectasis without complication (HCC) - Plan: albuterol  (PROVENTIL) (2.5 MG/3ML) 0.083% nebulizer solution  Gastroesophageal reflux disease, esophagitis presence not specified - Plan: albuterol (PROVENTIL) (2.5 MG/3ML) 0.083% nebulizer solution  Discussion: Mr. Hensen is 13 and has mild cylindrical bronchiectasis that produce moderate symptoms and that he has a significant amount of mucus production every day and he has recurrent episodes of bronchitis throughout the year.  We spent an extensive amount of time today going over his history.  I think that he likely has bronchiectasis because of severe pneumonia he experienced at age 40.  He did smoke for many years but interestingly he does not really have a COPD phenotype.  Plan: Bronchiectasis: In order to reduce the risk of respiratory infection you should do the following: Hypertonic saline twice a day Albuterol twice a day Flutter valve twice a day Call me if you have increasing chest congestion or mucus production  We will see you back in 6 months or sooner if needed  32 minutes spent in direct consultation with the patient, 45-minute visit     Current Outpatient Medications:  .  albuterol (PROVENTIL) (2.5 MG/3ML) 0.083% nebulizer solution, Take 3 mLs (2.5 mg total) by nebulization every 6 (six) hours as needed for wheezing or shortness of breath., Disp: 75 mL, Rfl: 3 .  aspirin EC 81 MG tablet, Take 81 mg by mouth daily., Disp: , Rfl:  .  azelastine (ASTELIN) 0.1 % nasal spray, PLACE 1 SPRAY INTO THE NOSE 1 DAY OR 1 DOSE. USE IN EACH NOSTRIL AS DIRECTED, Disp: 30 mL, Rfl: 11 .  Cholecalciferol (VITAMIN D3) 2000 UNITS TABS, Take  1 tablet by mouth daily., Disp: , Rfl:  .  Cyanocobalamin (VITAMIN B-12) 5000 MCG SUBL, Place 1 tablet under the tongue daily., Disp: , Rfl:  .  doxazosin (CARDURA) 8 MG tablet, TAKE 1/2 TO 1 TABLET BY MOUTH DAILY, Disp: 30 tablet, Rfl: 4 .  finasteride (PROSCAR) 5 MG tablet, Take 1 tablet by mouth daily., Disp: , Rfl: 10 .  fluticasone (FLONASE) 50 MCG/ACT nasal  spray, 1 spray in each nostril qd, Disp: 16 g, Rfl: 11 .  guaiFENesin (MUCINEX) 600 MG 12 hr tablet, Take 600 mg by mouth as needed. Reported on 05/25/2015, Disp: , Rfl:  .  latanoprost (XALATAN) 0.005 % ophthalmic solution, Place 1 drop into the left eye at bedtime., Disp: 2.5 mL, Rfl: 1 .  Probiotic Product (ALIGN) 4 MG CAPS, Take 1 tablet by mouth daily., Disp: , Rfl:  .  Respiratory Therapy Supplies (FLUTTER) DEVI, 1 Device by Other route as needed., Disp: 1 each, Rfl: 0 .  sodium chloride HYPERTONIC 3 % nebulizer solution, Take by nebulization 2 (two) times daily., Disp: 750 mL, Rfl: 3

## 2017-06-26 NOTE — Patient Instructions (Addendum)
Bronchiectasis: In order to reduce the risk of respiratory infection you should do the following: Hypertonic saline twice a day Albuterol twice a day Flutter valve twice a day Call me if you have increasing chest congestion or mucus production  We will see you back in 6 months or sooner if needed

## 2017-07-19 ENCOUNTER — Encounter: Payer: Self-pay | Admitting: Family Medicine

## 2017-07-19 ENCOUNTER — Ambulatory Visit (INDEPENDENT_AMBULATORY_CARE_PROVIDER_SITE_OTHER): Payer: Medicare Other | Admitting: Family Medicine

## 2017-07-19 VITALS — BP 94/58 | HR 73 | Temp 97.1°F | Ht 70.0 in | Wt 174.0 lb

## 2017-07-19 DIAGNOSIS — G8929 Other chronic pain: Secondary | ICD-10-CM

## 2017-07-19 DIAGNOSIS — R5383 Other fatigue: Secondary | ICD-10-CM | POA: Diagnosis not present

## 2017-07-19 DIAGNOSIS — K219 Gastro-esophageal reflux disease without esophagitis: Secondary | ICD-10-CM | POA: Diagnosis not present

## 2017-07-19 DIAGNOSIS — R51 Headache: Secondary | ICD-10-CM

## 2017-07-19 DIAGNOSIS — E559 Vitamin D deficiency, unspecified: Secondary | ICD-10-CM | POA: Diagnosis not present

## 2017-07-19 DIAGNOSIS — I700 Atherosclerosis of aorta: Secondary | ICD-10-CM | POA: Diagnosis not present

## 2017-07-19 DIAGNOSIS — J479 Bronchiectasis, uncomplicated: Secondary | ICD-10-CM | POA: Diagnosis not present

## 2017-07-19 DIAGNOSIS — K4090 Unilateral inguinal hernia, without obstruction or gangrene, not specified as recurrent: Secondary | ICD-10-CM

## 2017-07-19 DIAGNOSIS — J449 Chronic obstructive pulmonary disease, unspecified: Secondary | ICD-10-CM

## 2017-07-19 DIAGNOSIS — Z87891 Personal history of nicotine dependence: Secondary | ICD-10-CM | POA: Diagnosis not present

## 2017-07-19 DIAGNOSIS — N400 Enlarged prostate without lower urinary tract symptoms: Secondary | ICD-10-CM

## 2017-07-19 DIAGNOSIS — H9113 Presbycusis, bilateral: Secondary | ICD-10-CM | POA: Insufficient documentation

## 2017-07-19 DIAGNOSIS — H6123 Impacted cerumen, bilateral: Secondary | ICD-10-CM | POA: Diagnosis not present

## 2017-07-19 DIAGNOSIS — H9193 Unspecified hearing loss, bilateral: Secondary | ICD-10-CM | POA: Diagnosis not present

## 2017-07-19 NOTE — Patient Instructions (Addendum)
Medicare Annual Wellness Visit  [facility] and the medical providers at [facility] strive to bring you the best medical care.  In doing so we not only want to address your current medical conditions and concerns but also to detect new conditions early and prevent illness, disease and health-related problems.    Medicare offers a yearly Wellness Visit which allows our clinical staff to assess your need for preventative services including immunizations, lifestyle education, counseling to decrease risk of preventable diseases and screening for fall risk and other medical concerns.    This visit is provided free of charge (no copay) for all Medicare recipients. The clinical pharmacists at [facility] have begun to conduct these Wellness Visits which will also include a thorough review of all your medications.    As you primary medical provider recommend that you make an appointment for your Annual Wellness Visit if you have not done so already this year.  You may set up this appointment before you leave today or you may call back ([phone]) and schedule an appointment.  Please make sure when you call that you mention that you are scheduling your Annual Wellness Visit with the clinical pharmacist so that the appointment may be made for the proper length of time.     Continue current medications. Continue good therapeutic lifestyle changes which include good diet and exercise. Fall precautions discussed with patient. If an FOBT was given today- please return it to our front desk. If you are over 80 years old - you may need Prevnar 5 or the adult Pneumonia vaccine.  **Flu shots are available--- please call and schedule a FLU-CLINIC appointment**  After your visit with [place] today you will receive a survey in the mail or online from [employer] regarding your care with [place]. Please take a moment to fill this out. Your feedback is very  important to [place] as you can help [place] better understand your patient needs as well as improve your experience and satisfaction. WE CARE ABOUT YOU!!!   Continue to follow-up with Dr. Lake Bells Call and make a follow-up appointment with Dr. Jannifer Franklin Try to begin some walking exercises that you can gradually increase as the weather warms up Eat less sugar Drink more water Continue with Mucinex Astelin and fluticasone Use nasal saline for head congestion Use nebulizer treatment more regularly and especially if developing cough or increased wheezing Remember that Excedrin Migraine has aspirin in it and if you are taking this you do not need to take the baby aspirin.  Too much aspirin can irritate the hiatal hernia more and cause more gastritis. If you develop severe pain with the inguinal hernia he should get to the emergency room immediately.

## 2017-07-19 NOTE — Progress Notes (Signed)
Subjective:    Patient ID: Charles Golden, male    DOB: 1926-04-11, 84 y.o.   MRN: [id number]  HPI Pt here for follow up and management of chronic medical problems which includes gerd and copd. He is taking medication regularly.  The patient today complains of ongoing fatigue and headaches.  He has questions about Excedrin Migraine and baby aspirin.  He is followed regularly by the urologist and gets a rectal exam by him.  He is due to return in FOBT and this will be given to him today to return.  He will also get lab work today.  The patient has multiple complaints.  This includes fatigue and headaches.  We discussed the Excedrin Migraine and the amount of aspirin that is in each pill and I reminded him that this the caffeine intake could irritate his hernia more.  He also has questions about the 81 mg aspirin and if he is taking Excedrin Migraine he does not need the 81 mg aspirin.  He complains of decreased energy and increased napping.  He says his loose stools are better.  He still has the headache and these are frontal in nature.  He has plans to reschedule a visit with Dr. Jannifer Franklin the neurologist.  He says he may have 4-5 headaches over a 7-day period of time.  He denies any chest pain and his shortness of breath is stable.  He has had some back pain in the lumbar area on the left side.  He says he is passing his water without problems.  He is currently seeing Dr. Lake Bells, the pulmonologist.  X-rays from October 2017 of the lumbar spine were reviewed and he does have scoliosis degenerative anterior listhesis at L2-3 L3 and 4 and L4 and 5 with facet degeneration at L4 and 5.  This could certainly be responsible for some of the pain that he is having in his left low back.  He will be given a copy of this report today.    Patient Active Problem List   Diagnosis Date Noted  . Restless leg syndrome 11/25/2016  . Thoracic aortic atherosclerosis ([place]) 05/03/2015  . Upper airway cough syndrome 04/28/2015   . GERD (gastroesophageal reflux disease) 04/11/2015  . Tinea corporis 02/15/2015  . Acute medial meniscal tear 07/20/2014  . COPD GOLD I  12/08/2013  . Small bowel volvulus ([place]) 08/05/2013  . Chronic headache 08/05/2013  . Left inguinal hernia 08/05/2013  . Incisional hernia, without obstruction or gangrene 08/05/2013  . Bronchiectasis with (acute) exacerbation ([place]) 06/22/2013  . Headache 05/01/2013  . Obstructive chronic bronchitis without exacerbation ([place]) 04/30/2013  . Left hip pain 04/29/2013  . BPH (benign prostatic hyperplasia) 01/05/2013  . IBS (irritable bowel syndrome) 09/03/2012  . Chronic rhinosinusitis 09/03/2012  . Lung nodules 05/04/2003   Outpatient Encounter Medications as of 07/19/2017  Medication Sig  . albuterol (PROVENTIL) (2.5 MG/3ML) 0.083% nebulizer solution Take 3 mLs (2.5 mg total) by nebulization every 6 (six) hours as needed for wheezing or shortness of breath.  Marland Kitchen aspirin EC 81 MG tablet Take 81 mg by mouth daily.  Marland Kitchen azelastine (ASTELIN) 0.1 % nasal spray PLACE 1 SPRAY INTO THE NOSE 1 DAY OR 1 DOSE. USE IN EACH NOSTRIL AS DIRECTED  . Cholecalciferol (VITAMIN D3) 2000 UNITS TABS Take 1 tablet by mouth daily.  . Cyanocobalamin (VITAMIN B-12) 5000 MCG SUBL Place 1 tablet under the tongue daily.  Marland Kitchen doxazosin (CARDURA) 8 MG tablet TAKE 1/2 TO 1 TABLET BY MOUTH  DAILY  . finasteride (PROSCAR) 5 MG tablet Take 1 tablet by mouth daily.  . fluticasone (FLONASE) 50 MCG/ACT nasal spray 1 spray in each nostril qd  . guaiFENesin (MUCINEX) 600 MG 12 hr tablet Take 600 mg by mouth as needed. Reported on 05/25/2015  . latanoprost (XALATAN) 0.005 % ophthalmic solution Place 1 drop into the left eye at bedtime.  . Probiotic Product (ALIGN) 4 MG CAPS Take 1 tablet by mouth daily.  Marland Kitchen Respiratory Therapy Supplies (FLUTTER) DEVI 1 Device by Other route as needed.  . sodium chloride HYPERTONIC 3 % nebulizer solution Take by nebulization 2 (two) times daily.  . [DISCONTINUED]  sodium chloride HYPERTONIC 3 % nebulizer solution Take by nebulization as needed for other.   No facility-administered encounter medications on file as of 07/19/2017.       Review of Systems  Constitutional: Positive for fatigue.  HENT: Negative.   Eyes: Negative.   Respiratory: Negative.   Cardiovascular: Negative.   Gastrointestinal: Negative.   Endocrine: Negative.   Genitourinary: Negative.   Musculoskeletal: Negative.   Skin: Negative.   Allergic/Immunologic: Negative.   Neurological: Positive for headaches.  Hematological: Negative.   Psychiatric/Behavioral: Negative.        Objective:   Physical Exam  Constitutional: He is oriented to person, place, and time. He appears well-developed and well-nourished. No distress.  The patient is pleasant and alert and has many questions about the things that are bothering him.  He is soon to be 84 years old and in fact I think he is doing quite well for his age.  HENT:  Head: Normocephalic and atraumatic.  Right Ear: External ear normal.  Left Ear: External ear normal.  Mouth/Throat: Oropharynx is clear and moist. No oropharyngeal exudate.  Nasal congestion bilaterally.  Minimal drainage posterior throat, in the past I have seen purulent yellow drainage so his current treatment seems to be working well for his head congestion and drainage.  The patient has diminished hearing.  He wears a hearing aid currently in the left ear and is replacing the one that was lost for the right ear.  Eyes: Conjunctivae and EOM are normal. Pupils are equal, round, and reactive to light. Right eye exhibits no discharge. Left eye exhibits no discharge. No scleral icterus.  Neck: Normal range of motion. Neck supple. No thyromegaly present.  No bruits thyromegaly or anterior cervical adenopathy  Cardiovascular: Normal rate, regular rhythm and normal heart sounds.  No murmur heard. The heart is regular at 72/min with decreased pedal pulses  Pulmonary/Chest:  Effort normal. No respiratory distress. He has wheezes. He has no rales. He exhibits no tenderness.  Slight bronchial congestion with coughing and expiratory wheezes.  He has been worse in the past.  No axillary adenopathy.  Abdominal: Soft. Bowel sounds are normal. He exhibits no mass. There is no tenderness. There is no rebound and no guarding.  Genitourinary: Rectum normal, prostate normal and penis normal.  Musculoskeletal: Normal range of motion. He exhibits no edema or tenderness.  Lymphadenopathy:    He has no cervical adenopathy.  Neurological: He is alert and oriented to person, place, and time. He has normal reflexes. No cranial nerve deficit.  Skin: Skin is warm and dry. No rash noted.  Psychiatric: He has a normal mood and affect. His behavior is normal. Judgment and thought content normal.  Nursing note and vitals reviewed.  BP (!) 94/58 (BP Location: Left Arm)   Pulse 73   Temp (!) 97.1 F (36.2  C) (Oral)   Ht 5' 10"  (1.778 m)   Wt 174 lb (78.9 kg)   BMI 24.97 kg/m         Assessment & Plan:  1. Vitamin D deficiency -Continue current treatment pending results of lab work - CBC with Differential/Platelet - VITAMIN D 25 Hydroxy (Vit-D Deficiency, Fractures)  2. Thoracic aortic atherosclerosis ([place]) -Continue aggressive therapeutic lifestyle changes which include exercise and diet - CBC with Differential/Platelet - BMP8+EGFR - Lipid panel  3. Benign prostatic hyperplasia, unspecified whether lower urinary tract symptoms present -The patient is voiding well currently. - CBC with Differential/Platelet  4. Gastroesophageal reflux disease, esophagitis presence not specified -The patient should continue to be careful with caffeine intake and aspirin intake as much as possible and this is why recommended that he take Tylenol arthritis for his aches and pains instead of Excedrin migraine. - CBC with Differential/Platelet - Hepatic function panel  5. COPD GOLD I    -Continue follow-up with pulmonology - CBC with Differential/Platelet  6. Other fatigue - CBC with Differential/Platelet - BMP8+EGFR - Thyroid Panel With TSH  7. Chronic nonintractable headache, unspecified headache type -Patient will make appointment to follow-up with neurology regarding ongoing frontal headache which sounds more contraction headache type than anything else.  8. Bronchiectasis without complication ([place]) -Continue follow-up with pulmonology  9. Right inguinal hernia -This still bothers him at times and he understands that if he develops any severe pain that he get to the emergency room as soon as possible  10. Bilateral hearing loss, unspecified hearing loss type -Replace lost hearing aid right ear  Patient Instructions                       Medicare Annual Wellness Visit  [facility] and the medical providers at [facility] strive to bring you the best medical care.  In doing so we not only want to address your current medical conditions and concerns but also to detect new conditions early and prevent illness, disease and health-related problems.    Medicare offers a yearly Wellness Visit which allows our clinical staff to assess your need for preventative services including immunizations, lifestyle education, counseling to decrease risk of preventable diseases and screening for fall risk and other medical concerns.    This visit is provided free of charge (no copay) for all Medicare recipients. The clinical pharmacists at [facility] have begun to conduct these Wellness Visits which will also include a thorough review of all your medications.    As you primary medical provider recommend that you make an appointment for your Annual Wellness Visit if you have not done so already this year.  You may set up this appointment before you leave today or you may call back ([phone]) and schedule an appointment.  Please make sure  when you call that you mention that you are scheduling your Annual Wellness Visit with the clinical pharmacist so that the appointment may be made for the proper length of time.     Continue current medications. Continue good therapeutic lifestyle changes which include good diet and exercise. Fall precautions discussed with patient. If an FOBT was given today- please return it to our front desk. If you are over 53 years old - you may need Prevnar 30 or the adult Pneumonia vaccine.  **Flu shots are available--- please call and schedule a FLU-CLINIC appointment**  After your visit with [place] today you will receive a survey in  the mail or online from [employer] regarding your care with [place]. Please take a moment to fill this out. Your feedback is very important to [place] as you can help [place] better understand your patient needs as well as improve your experience and satisfaction. WE CARE ABOUT YOU!!!   Continue to follow-up with Dr. Lake Bells Call and make a follow-up appointment with Dr. Jannifer Franklin Try to begin some walking exercises that you can gradually increase as the weather warms up Eat less sugar Drink more water Continue with Mucinex Astelin and fluticasone Use nasal saline for head congestion Use nebulizer treatment more regularly and especially if developing cough or increased wheezing Remember that Excedrin Migraine has aspirin in it and if you are taking this you do not need to take the baby aspirin.  Too much aspirin can irritate the hiatal hernia more and cause more gastritis. If you develop severe pain with the inguinal hernia he should get to the emergency room immediately.  Arrie Senate MD

## 2017-07-20 LAB — HEPATIC FUNCTION PANEL
ALT: 10 IU/L (ref 0–44)
AST: 15 IU/L (ref 0–40)
Albumin: 3.8 g/dL (ref 3.5–4.7)
Alkaline Phosphatase: 90 IU/L (ref 39–117)
Bilirubin Total: 0.4 mg/dL (ref 0.0–1.2)
Bilirubin, Direct: 0.13 mg/dL (ref 0.00–0.40)
TOTAL PROTEIN: 6.2 g/dL (ref 6.0–8.5)

## 2017-07-20 LAB — CBC WITH DIFFERENTIAL/PLATELET
Basophils Absolute: 0 10*3/uL (ref 0.0–0.2)
Basos: 0 %
EOS (ABSOLUTE): 0.2 10*3/uL (ref 0.0–0.4)
Eos: 3 %
HEMATOCRIT: 42.4 % (ref 37.5–51.0)
Hemoglobin: 13.8 g/dL (ref 13.0–17.7)
IMMATURE GRANULOCYTES: 0 %
Immature Grans (Abs): 0 10*3/uL (ref 0.0–0.1)
LYMPHS ABS: 2 10*3/uL (ref 0.7–3.1)
Lymphs: 27 %
MCH: 30.5 pg (ref 26.6–33.0)
MCHC: 32.5 g/dL (ref 31.5–35.7)
MCV: 94 fL (ref 79–97)
MONOS ABS: 0.6 10*3/uL (ref 0.1–0.9)
Monocytes: 8 %
NEUTROS PCT: 62 %
Neutrophils Absolute: 4.5 10*3/uL (ref 1.4–7.0)
PLATELETS: 240 10*3/uL (ref 150–379)
RBC: 4.53 x10E6/uL (ref 4.14–5.80)
RDW: 14.1 % (ref 12.3–15.4)
WBC: 7.3 10*3/uL (ref 3.4–10.8)

## 2017-07-20 LAB — BMP8+EGFR
BUN / CREAT RATIO: 11 (ref 10–24)
BUN: 17 mg/dL (ref 8–27)
CALCIUM: 9.1 mg/dL (ref 8.6–10.2)
CHLORIDE: 106 mmol/L (ref 96–106)
CO2: 23 mmol/L (ref 20–29)
Creatinine, Ser: 1.49 mg/dL — ABNORMAL HIGH (ref 0.76–1.27)
GFR, EST AFRICAN AMERICAN: 47 mL/min/{1.73_m2} — AB (ref >59)
GFR, EST NON AFRICAN AMERICAN: 41 mL/min/{1.73_m2} — AB (ref >59)
Glucose: 94 mg/dL (ref 65–99)
POTASSIUM: 4.8 mmol/L (ref 3.5–5.2)
SODIUM: 142 mmol/L (ref 134–144)

## 2017-07-20 LAB — LIPID PANEL
CHOL/HDL RATIO: 2.8 ratio (ref 0.0–5.0)
Cholesterol, Total: 153 mg/dL (ref 100–199)
HDL: 55 mg/dL (ref >39)
LDL Calculated: 85 mg/dL (ref 0–99)
Triglycerides: 66 mg/dL (ref 0–149)
VLDL Cholesterol Cal: 13 mg/dL (ref 5–40)

## 2017-07-20 LAB — THYROID PANEL WITH TSH
FREE THYROXINE INDEX: 1.8 (ref 1.2–4.9)
T3 UPTAKE RATIO: 24 % (ref 24–39)
T4 TOTAL: 7.4 ug/dL (ref 4.5–12.0)
TSH: 2.52 u[IU]/mL (ref 0.450–4.500)

## 2017-07-20 LAB — VITAMIN D 25 HYDROXY (VIT D DEFICIENCY, FRACTURES): VIT D 25 HYDROXY: 41 ng/mL (ref 30.0–100.0)

## 2017-07-22 ENCOUNTER — Ambulatory Visit: Payer: Medicare Other | Admitting: Family Medicine

## 2017-07-23 ENCOUNTER — Other Ambulatory Visit: Payer: Medicare Other

## 2017-07-23 DIAGNOSIS — Z1211 Encounter for screening for malignant neoplasm of colon: Secondary | ICD-10-CM | POA: Diagnosis not present

## 2017-07-24 ENCOUNTER — Other Ambulatory Visit: Payer: Self-pay | Admitting: *Deleted

## 2017-07-24 DIAGNOSIS — Z87442 Personal history of urinary calculi: Secondary | ICD-10-CM | POA: Diagnosis not present

## 2017-07-24 DIAGNOSIS — N401 Enlarged prostate with lower urinary tract symptoms: Secondary | ICD-10-CM | POA: Diagnosis not present

## 2017-07-24 DIAGNOSIS — N481 Balanitis: Secondary | ICD-10-CM | POA: Diagnosis not present

## 2017-07-24 DIAGNOSIS — R351 Nocturia: Secondary | ICD-10-CM | POA: Diagnosis not present

## 2017-07-24 MED ORDER — FLUTICASONE PROPIONATE 50 MCG/ACT NA SUSP: NASAL | 11 refills | Status: DC

## 2017-07-24 MED ORDER — AZELASTINE HCL 0.1 % NA SOLN: NASAL | 11 refills | Status: DC

## 2017-07-25 LAB — FECAL OCCULT BLOOD, IMMUNOCHEMICAL: FECAL OCCULT BLD: NEGATIVE

## 2017-08-12 DIAGNOSIS — L304 Erythema intertrigo: Secondary | ICD-10-CM | POA: Diagnosis not present

## 2017-08-12 DIAGNOSIS — L570 Actinic keratosis: Secondary | ICD-10-CM | POA: Diagnosis not present

## 2017-08-12 DIAGNOSIS — D225 Melanocytic nevi of trunk: Secondary | ICD-10-CM | POA: Diagnosis not present

## 2017-09-23 DIAGNOSIS — L304 Erythema intertrigo: Secondary | ICD-10-CM | POA: Diagnosis not present

## 2017-09-23 DIAGNOSIS — L821 Other seborrheic keratosis: Secondary | ICD-10-CM | POA: Diagnosis not present

## 2017-10-25 ENCOUNTER — Encounter: Payer: Self-pay | Admitting: Neurology

## 2017-10-25 ENCOUNTER — Telehealth: Payer: Self-pay

## 2017-10-25 NOTE — Telephone Encounter (Signed)
-----   Message from Kathrynn Ducking, MD sent at 10/25/2017 12:31 PM EDT ----- Please call this gentleman and have him worked in sometime in the next 2 or 3 weeks for a revisit.  Thank you.

## 2017-10-25 NOTE — Telephone Encounter (Signed)
I called pt, offered him an appt with Dr. Jannifer Franklin on 10/30/17 at 7:30am, check in 7:00am. Pt verbalized understanding of appt date and time and is agreeable to this appt.

## 2017-10-30 ENCOUNTER — Ambulatory Visit (INDEPENDENT_AMBULATORY_CARE_PROVIDER_SITE_OTHER): Payer: Medicare Other | Admitting: Neurology

## 2017-10-30 ENCOUNTER — Encounter: Payer: Self-pay | Admitting: Neurology

## 2017-10-30 VITALS — BP 120/82 | HR 63 | Ht 70.5 in | Wt 174.0 lb

## 2017-10-30 DIAGNOSIS — G8929 Other chronic pain: Secondary | ICD-10-CM

## 2017-10-30 DIAGNOSIS — R51 Headache: Secondary | ICD-10-CM | POA: Diagnosis not present

## 2017-10-30 MED ORDER — DIVALPROEX SODIUM 500 MG PO DR TAB: 500.0000 mg | DELAYED_RELEASE_TABLET | Freq: Two times a day (BID) | ORAL | 3 refills | Status: DC

## 2017-10-30 NOTE — Progress Notes (Signed)
Reason for visit: Headache  Charles Golden is an 84 y.o. male  History of present illness:  Charles Golden is a 84 year old right-handed white male with a history of headaches over the last 7 or 8 years.  The patient has been on gabapentin caffeine intake.  The patient claims that he has taken gabapentin and Zonegran in the past without much benefit.  The only thing that helps his headaches is up to 2 cups of coffee that is quite strong daily and he will take Excedrin Migraine for his headache, he claims he averages about 2 tablets a week.  The patient may get early morning headaches, he claims that he may get headaches at other times as well.  The headaches are frontal in nature, and are not severe.  He denies any nausea or vomiting or vision changes.  The patient has a daughter with severe migraine headache.  The patient was seen 2 years ago, he has never followed up until now.  The patient returns for further evaluation.  Past Medical History:  Diagnosis Date  . Bronchitis, chronic ([place])   . COPD (chronic obstructive pulmonary disease) ([place])   . Enlarged prostate   . GERD (gastroesophageal reflux disease)   . Glucagonoma   . Hernia, incisional    at present  . HOH (hard of hearing)   . IBS (irritable bowel syndrome)   . Sciatic pain   . Sinus congestion     Past Surgical History:  Procedure Laterality Date  . BOWEL RESECTION N/A 09/04/2012   Procedure: SMALL BOWEL RESECTION;  Surgeon: Donato Heinz, MD;  Location: [facility];  Service: General;  Laterality: N/A;  Anastimosis  . HEMORRHOID SURGERY    . HERNIA REPAIR Right 70's  . INGUINAL HERNIA REPAIR Left 09/14/2013   Procedure: LEFT INGUINAL HERNIORRHAPHY;  Surgeon: Jamesetta So, MD;  Location: [facility];  Service: General;  Laterality: Left;  . INSERTION OF MESH Left 09/14/2013   Procedure: INSERTION OF MESH;  Surgeon: Jamesetta So, MD;  Location: [facility];  Service: General;  Laterality: Left;  . KNEE ARTHROSCOPY Left 07/21/2014   Procedure: LEFT KNEE ARTHROSCOPY WITH MEDIAL MENISCAL DEBRIDEMENT ;  Surgeon: Gearlean Alf, MD;  Location: [facility];  Service: Orthopedics;  Laterality: Left;  . LAPAROTOMY N/A 09/04/2012   Procedure: EXPLORATORY LAPAROTOMY;  Surgeon: Donato Heinz, MD;  Location: [facility];  Service: General;  Laterality: N/A;  . SKIN LESION EXCISION     [name]  . TONSILLECTOMY      Family History  Problem Relation Age of Onset  . Heart disease Mother        Valve replacement and pacemaker  . Colon cancer Father   . Asthma Unknown        cousin  . Healthy Daughter   . Healthy Daughter   . Healthy Daughter     Social history:  reports that he quit smoking about 11 years ago. His smoking use included cigarettes. He has a 30.00 pack-year smoking history. He has never used smokeless tobacco. He reports that he drinks alcohol. He reports that he does not use drugs.    Allergies  Allergen Reactions  . Demerol [Meperidine] Other (See Comments)    Pt. States "he woke up during a colonoscopy"  . Spiriva Handihaler [Tiotropium Bromide Monohydrate] Other (See Comments)    Mild Urinary Retention    Medications:  Prior to Admission medications   Medication Sig Start Date End Date Taking?  Authorizing Provider  albuterol (PROVENTIL) (2.5 MG/3ML) 0.083% nebulizer solution Take 3 mLs (2.5 mg total) by nebulization every 6 (six) hours as needed for wheezing or shortness of breath. 06/26/17  Yes Juanito Doom, MD  aspirin EC 81 MG tablet Take 81 mg by mouth daily.   Yes [provider]  azelastine (ASTELIN) 0.1 % nasal spray PLACE 1 SPRAY INTO THE NOSE 1 DAY OR 1 DOSE. USE IN EACH NOSTRIL AS DIRECTED 07/24/17  Yes Chipper Herb, MD  Cholecalciferol (VITAMIN D3) 2000 UNITS TABS Take 1 tablet by mouth daily.   Yes [provider]  Cyanocobalamin (VITAMIN B-12) 5000 MCG SUBL Place 1 tablet under the tongue daily.   Yes [provider]  doxazosin (CARDURA) 8 MG tablet TAKE 1/2 TO 1  TABLET BY MOUTH DAILY 10/02/16  Yes Chipper Herb, MD  finasteride (PROSCAR) 5 MG tablet Take 1 tablet by mouth daily. 03/19/16  Yes [provider]  fluticasone Asencion Islam) 50 MCG/ACT nasal spray 1 spray in each nostril qd 07/24/17  Yes Chipper Herb, MD  guaiFENesin (MUCINEX) 600 MG 12 hr tablet Take 600 mg by mouth as needed. Reported on 05/25/2015   Yes [provider]  latanoprost (XALATAN) 0.005 % ophthalmic solution Place 1 drop into the left eye at bedtime. 06/01/16  Yes Chipper Herb, MD  Probiotic Product (ALIGN) 4 MG CAPS Take 1 tablet by mouth daily.   Yes [provider]  Respiratory Therapy Supplies (FLUTTER) DEVI 1 Device by Other route as needed. 07/05/16  Yes Javier Glazier, MD  sodium chloride HYPERTONIC 3 % nebulizer solution Take by nebulization 2 (two) times daily. 06/07/16  Yes Javier Glazier, MD    ROS:  Out of a complete 14 system review of symptoms, the patient complains only of the following symptoms, and all other reviewed systems are negative.  Fatigue Hearing loss Wheezing, shortness of breath Abdominal pain Frequency of urination Headache  Blood pressure 120/82, pulse 63, height 5' 10.5" (1.791 m), weight 174 lb (78.9 kg).  Physical Exam  General: The patient is alert and cooperative at the time of the examination.  Skin: No significant peripheral edema is noted.   Neurologic Exam  Mental status: The patient is alert and oriented x 3 at the time of the examination. The patient has apparent normal recent and remote memory, with an apparently normal attention span and concentration ability.   Cranial nerves: Facial symmetry is present. Speech is normal, no aphasia or dysarthria is noted. Extraocular movements are full. Visual fields are full.  Motor: The patient has good strength in all 4 extremities.  Sensory examination: Soft touch sensation is symmetric on the face, arms, and legs.  Coordination: The patient has  good finger-nose-finger and heel-to-shin bilaterally.  Gait and station: The patient has a normal gait. Tandem gait is slightly unsteady. Romberg is negative. No drift is seen.  Reflexes: Deep tendon reflexes are symmetric.   Assessment/Plan:  1.  Headache, migraine versus caffeine withdrawal headache  The patient has a headache pattern that responds quite well to caffeine.  The only 2 types of headaches that will respond to caffeine as the patient describes.  Migraine will respond to caffeine, and caffeine withdrawal headaches will respond to caffeine.  The patient is drinking 2 cups of very strong coffee a day, this may be enough to create caffeine withdrawal headache.  We discussed the possibility of coming off caffeine completely, the patient does not wish to consider  this.  We will try Depakote starting at 500 mg daily for 2 weeks and then go to 500 mg twice daily.  If the migraine treatments do not help him, we will need to come off of caffeine in the future.  He will follow-up for his next revisit in October.  Jill Alexanders MD 10/30/2017 7:52 AM  [facility] [address] [place], [place] [address]  Phone [phone] Fax [phone]

## 2017-10-30 NOTE — Patient Instructions (Signed)
We will start Depakote 500 mg tablet one tablet a day for 2 weeks, then take one tablet twice a day.

## 2017-11-05 ENCOUNTER — Encounter: Payer: Self-pay | Admitting: Neurology

## 2017-11-06 ENCOUNTER — Other Ambulatory Visit: Payer: Self-pay | Admitting: Neurology

## 2017-11-06 MED ORDER — VENLAFAXINE HCL ER 37.5 MG PO CP24: ORAL_CAPSULE | ORAL | 3 refills | Status: DC

## 2017-11-11 ENCOUNTER — Encounter: Payer: Self-pay | Admitting: Neurology

## 2017-11-12 DIAGNOSIS — H40051 Ocular hypertension, right eye: Secondary | ICD-10-CM | POA: Diagnosis not present

## 2017-11-12 DIAGNOSIS — Z961 Presence of intraocular lens: Secondary | ICD-10-CM | POA: Diagnosis not present

## 2017-11-12 DIAGNOSIS — H26491 Other secondary cataract, right eye: Secondary | ICD-10-CM | POA: Diagnosis not present

## 2017-11-12 DIAGNOSIS — H401122 Primary open-angle glaucoma, left eye, moderate stage: Secondary | ICD-10-CM | POA: Diagnosis not present

## 2017-11-12 DIAGNOSIS — H353131 Nonexudative age-related macular degeneration, bilateral, early dry stage: Secondary | ICD-10-CM | POA: Diagnosis not present

## 2017-11-13 ENCOUNTER — Encounter: Payer: Self-pay | Admitting: Neurology

## 2017-11-21 ENCOUNTER — Encounter: Payer: Self-pay | Admitting: Family Medicine

## 2017-11-21 ENCOUNTER — Ambulatory Visit (INDEPENDENT_AMBULATORY_CARE_PROVIDER_SITE_OTHER): Payer: Medicare Other

## 2017-11-21 ENCOUNTER — Ambulatory Visit (INDEPENDENT_AMBULATORY_CARE_PROVIDER_SITE_OTHER): Payer: Medicare Other | Admitting: Family Medicine

## 2017-11-21 VITALS — BP 117/70 | HR 60 | Temp 97.7°F | Ht 70.5 in | Wt 175.0 lb

## 2017-11-21 DIAGNOSIS — E559 Vitamin D deficiency, unspecified: Secondary | ICD-10-CM

## 2017-11-21 DIAGNOSIS — K4090 Unilateral inguinal hernia, without obstruction or gangrene, not specified as recurrent: Secondary | ICD-10-CM | POA: Diagnosis not present

## 2017-11-21 DIAGNOSIS — R5383 Other fatigue: Secondary | ICD-10-CM | POA: Diagnosis not present

## 2017-11-21 DIAGNOSIS — R51 Headache: Secondary | ICD-10-CM

## 2017-11-21 DIAGNOSIS — M25511 Pain in right shoulder: Secondary | ICD-10-CM | POA: Diagnosis not present

## 2017-11-21 DIAGNOSIS — I700 Atherosclerosis of aorta: Secondary | ICD-10-CM | POA: Diagnosis not present

## 2017-11-21 DIAGNOSIS — R519 Headache, unspecified: Secondary | ICD-10-CM

## 2017-11-21 DIAGNOSIS — N400 Enlarged prostate without lower urinary tract symptoms: Secondary | ICD-10-CM

## 2017-11-21 DIAGNOSIS — M19011 Primary osteoarthritis, right shoulder: Secondary | ICD-10-CM | POA: Diagnosis not present

## 2017-11-21 DIAGNOSIS — J449 Chronic obstructive pulmonary disease, unspecified: Secondary | ICD-10-CM | POA: Diagnosis not present

## 2017-11-21 DIAGNOSIS — Z136 Encounter for screening for cardiovascular disorders: Secondary | ICD-10-CM | POA: Diagnosis not present

## 2017-11-21 DIAGNOSIS — K219 Gastro-esophageal reflux disease without esophagitis: Secondary | ICD-10-CM | POA: Diagnosis not present

## 2017-11-21 DIAGNOSIS — R6889 Other general symptoms and signs: Secondary | ICD-10-CM | POA: Diagnosis not present

## 2017-11-21 NOTE — Progress Notes (Signed)
Subjective:    Patient ID: Charles Golden, male    DOB: 1925/10/27, 84 y.o.   MRN: [id number]  HPI  Pt here for follow up and management of chronic medical problems which includes gerd. He is taking medication regularly.  This patient has a long history of COPD cough headache chronic rhinosinusitis and GERD.  He has been seeing Dr. Jannifer Franklin recently for his ongoing headache symptoms.  The diagnosis at the time was considered migraine versus caffeine withdrawal headache.  He was started on Depakote and this was from a visit early in June of this month.  He is also been seen by Dr. Erik Obey because of presbycusis of both ears.  He has been seen by Dr. Lake Bells because of bronchiectasis.  The patient has been followed by the neurologist because of his headaches and several different medicines have been tried which he has not tolerated.  He is currently on removing caffeine from his diet even though he continues to take some Excedrin headache no more than 3 days a week.  The headaches are frontal in nature and are severe.  He also complains of ongoing fatigue.  We will get additional lab work today.  He denies any chest pain and says that his shortness of breath has increased slightly.  He is currently not using his albuterol inhaler and he was encouraged to go back and use this as recommended by the pulmonologist with his nebulizer.  In general in the past he has had loose bowel movements but he is now in more of a constipation mood.  He has not seen any blood in the stool or had any black tarry bowel movements.  He is passing his water without problems.   Patient Active Problem List   Diagnosis Date Noted  . Restless leg syndrome 11/25/2016  . Thoracic aortic atherosclerosis ([place]) 05/03/2015  . Upper airway cough syndrome 04/28/2015  . GERD (gastroesophageal reflux disease) 04/11/2015  . Tinea corporis 02/15/2015  . Acute medial meniscal tear 07/20/2014  . COPD GOLD I  12/08/2013  . Small bowel volvulus  ([place]) 08/05/2013  . Chronic headache 08/05/2013  . Left inguinal hernia 08/05/2013  . Incisional hernia, without obstruction or gangrene 08/05/2013  . Bronchiectasis with (acute) exacerbation ([place]) 06/22/2013  . Headache 05/01/2013  . Obstructive chronic bronchitis without exacerbation ([place]) 04/30/2013  . Left hip pain 04/29/2013  . BPH (benign prostatic hyperplasia) 01/05/2013  . IBS (irritable bowel syndrome) 09/03/2012  . Chronic rhinosinusitis 09/03/2012  . Lung nodules 05/04/2003   Outpatient Encounter Medications as of 11/21/2017  Medication Sig  . albuterol (PROVENTIL) (2.5 MG/3ML) 0.083% nebulizer solution Take 3 mLs (2.5 mg total) by nebulization every 6 (six) hours as needed for wheezing or shortness of breath.  Marland Kitchen aspirin EC 81 MG tablet Take 81 mg by mouth daily.  Marland Kitchen azelastine (ASTELIN) 0.1 % nasal spray PLACE 1 SPRAY INTO THE NOSE 1 DAY OR 1 DOSE. USE IN EACH NOSTRIL AS DIRECTED  . Cholecalciferol (VITAMIN D3) 2000 UNITS TABS Take 1 tablet by mouth daily.  . Cyanocobalamin (VITAMIN B-12) 5000 MCG SUBL Place 1 tablet under the tongue daily.  Marland Kitchen doxazosin (CARDURA) 8 MG tablet TAKE 1/2 TO 1 TABLET BY MOUTH DAILY  . finasteride (PROSCAR) 5 MG tablet Take 1 tablet by mouth daily.  . fluticasone (FLONASE) 50 MCG/ACT nasal spray 1 spray in each nostril qd  . guaiFENesin (MUCINEX) 600 MG 12 hr tablet Take 600 mg by mouth as needed. Reported on 05/25/2015  .  latanoprost (XALATAN) 0.005 % ophthalmic solution Place 1 drop into the left eye at bedtime.  . Probiotic Product (ALIGN) 4 MG CAPS Take 1 tablet by mouth daily.  Marland Kitchen Respiratory Therapy Supplies (FLUTTER) DEVI 1 Device by Other route as needed.  . sodium chloride HYPERTONIC 3 % nebulizer solution Take by nebulization 2 (two) times daily.  . [DISCONTINUED] venlafaxine XR (EFFEXOR XR) 37.5 MG 24 hr capsule 1 tablet daily for 2 weeks, then take 2 tablets daily   No facility-administered encounter medications on file as of 11/21/2017.         Review of Systems  Constitutional: Positive for fatigue.  HENT: Negative.   Eyes: Negative.   Respiratory: Negative.   Cardiovascular: Negative.   Gastrointestinal: Negative.   Endocrine: Negative.   Genitourinary: Negative.   Musculoskeletal: Negative.   Skin: Negative.   Allergic/Immunologic: Negative.   Neurological: Positive for headaches.  Hematological: Negative.   Psychiatric/Behavioral: Negative.        Objective:   Physical Exam  Constitutional: He is oriented to person, place, and time. He appears well-developed and well-nourished. No distress.  The patient has several complaints and the biggest one is his fatigue and not being able to do what he would like to do.  I think his mind is about 84 years old and his body is 84 years old.  HENT:  Head: Normocephalic and atraumatic.  Right Ear: External ear normal.  Left Ear: External ear normal.  Nose: Nose normal.  Mouth/Throat: Oropharynx is clear and moist. No oropharyngeal exudate.  Eyes: Pupils are equal, round, and reactive to light. Conjunctivae and EOM are normal. Right eye exhibits no discharge. Left eye exhibits no discharge. No scleral icterus.  Neck: Normal range of motion. Neck supple. No thyromegaly present.  No bruits thyromegaly or anterior cervical adenopathy  Cardiovascular: Normal rate, regular rhythm and normal heart sounds.  No murmur heard. Heart is regular at 60/min.  Distal pulses were difficult to palpate.  Pulmonary/Chest: Effort normal and breath sounds normal. No respiratory distress. He has no wheezes. He has no rales. He exhibits no tenderness.  Breath sounds were very good for this patient usually there is more wheezing and congestion and the lungs were fairly clear anteriorly and posteriorly today.  No axillary adenopathy  Abdominal: Soft. Bowel sounds are normal. He exhibits no mass. There is no tenderness. There is no guarding.  Musculoskeletal: Normal range of motion. He exhibits no  edema.  Some right posterior shoulder tenderness to palpation with fairly good mobility.  Lymphadenopathy:    He has no cervical adenopathy.  Neurological: He is alert and oriented to person, place, and time. He has normal reflexes. No cranial nerve deficit.  Skin: Skin is warm and dry. No rash noted.  Psychiatric: He has a normal mood and affect. His behavior is normal. Judgment and thought content normal.  The patient has normal mood affect and behavior just wants to have more energy.  He has been tried on Effexor but did not tolerate this.  Nursing note and vitals reviewed.  BP 117/70 (BP Location: Left Arm)   Pulse 60   Temp 97.7 F (36.5 C) (Oral)   Ht 5' 10.5" (1.791 m)   Wt 175 lb (79.4 kg)   BMI 24.75 kg/m         Assessment & Plan:  1. Thoracic aortic atherosclerosis ([place]) -Continue with aggressive therapeutic lifestyle changes as much as possible - BMP8+EGFR - CBC with Differential/Platelet - Lipid panel  2.  Vitamin D deficiency -Continue with vitamin D replacement pending results of lab work - CBC with Differential/Platelet - VITAMIN D 25 Hydroxy (Vit-D Deficiency, Fractures)  3. Benign prostatic hyperplasia, unspecified whether lower urinary tract symptoms present -No complaints today with voiding. - CBC with Differential/Platelet  4. Gastroesophageal reflux disease, esophagitis presence not specified -Start ranitidine 150 mg twice daily over-the-counter before breakfast and supper - CBC with Differential/Platelet - Hepatic function panel  5. COPD GOLD I  -Restart nebulizer treatment with albuterol and avoid irritating environments is much as possible.  Continue with Mucinex and drink plenty of fluids and stay well-hydrated - CBC with Differential/Platelet  6. Other fatigue - BMP8+EGFR - CBC with Differential/Platelet - VITAMIN D 25 Hydroxy (Vit-D Deficiency, Fractures) - Vitamin B12 - Thyroid Panel With TSH  7. Nonintractable headache, unspecified  chronicity pattern, unspecified headache type -Continue to follow-up with neurology and follow his recommendations regarding caffeine withdrawal - BMP8+EGFR - CBC with Differential/Platelet  8. Right inguinal hernia -This continues to bother him but no worse than in the past and he understands if he develops severe pain that he would get to the emergency room as soon as possible  Patient Instructions                       Medicare Annual Wellness Visit  [facility] and the medical providers at [facility] strive to bring you the best medical care.  In doing so we not only want to address your current medical conditions and concerns but also to detect new conditions early and prevent illness, disease and health-related problems.    Medicare offers a yearly Wellness Visit which allows our clinical staff to assess your need for preventative services including immunizations, lifestyle education, counseling to decrease risk of preventable diseases and screening for fall risk and other medical concerns.    This visit is provided free of charge (no copay) for all Medicare recipients. The clinical pharmacists at [facility] have begun to conduct these Wellness Visits which will also include a thorough review of all your medications.    As you primary medical provider recommend that you make an appointment for your Annual Wellness Visit if you have not done so already this year.  You may set up this appointment before you leave today or you may call back ([phone]) and schedule an appointment.  Please make sure when you call that you mention that you are scheduling your Annual Wellness Visit with the clinical pharmacist so that the appointment may be made for the proper length of time.      Continue current medications. Continue good therapeutic lifestyle changes which include good diet and exercise. Fall precautions discussed with patient. If an FOBT was  given today- please return it to our front desk. If you are over 108 years old - you may need Prevnar 37 or the adult Pneumonia vaccine.  **Flu shots are available--- please call and schedule a FLU-CLINIC appointment**  After your visit with [place] today you will receive a survey in the mail or online from [employer] regarding your care with [place]. Please take a moment to fill this out. Your feedback is very important to [place] as you can help [place] better understand your patient needs as well as improve your experience and satisfaction. WE CARE ABOUT YOU!!!   Discontinue baby aspirin Take ranitidine 150 mg or Zantac, the equate brand at [facility] and take 1 twice daily before breakfast  and supper Restart the albuterol nebulizer and at least use it once daily as recommended by the pulmonologist Continue with headache management as recommended by the neurologist, Dr. Jannifer Franklin Continue to work on diminishing the caffeine intake as much as possible We will call with lab work results as soon as these results become available  Arrie Senate MD

## 2017-11-21 NOTE — Patient Instructions (Addendum)
Medicare Annual Wellness Visit  [facility] and the medical providers at [facility] strive to bring you the best medical care.  In doing so we not only want to address your current medical conditions and concerns but also to detect new conditions early and prevent illness, disease and health-related problems.    Medicare offers a yearly Wellness Visit which allows our clinical staff to assess your need for preventative services including immunizations, lifestyle education, counseling to decrease risk of preventable diseases and screening for fall risk and other medical concerns.    This visit is provided free of charge (no copay) for all Medicare recipients. The clinical pharmacists at [facility] have begun to conduct these Wellness Visits which will also include a thorough review of all your medications.    As you primary medical provider recommend that you make an appointment for your Annual Wellness Visit if you have not done so already this year.  You may set up this appointment before you leave today or you may call back ([phone]) and schedule an appointment.  Please make sure when you call that you mention that you are scheduling your Annual Wellness Visit with the clinical pharmacist so that the appointment may be made for the proper length of time.      Continue current medications. Continue good therapeutic lifestyle changes which include good diet and exercise. Fall precautions discussed with patient. If an FOBT was given today- please return it to our front desk. If you are over 54 years old - you may need Prevnar 45 or the adult Pneumonia vaccine.  **Flu shots are available--- please call and schedule a FLU-CLINIC appointment**  After your visit with [place] today you will receive a survey in the mail or online from [employer] regarding your care with [place]. Please take a moment to fill this out. Your feedback is very  important to [place] as you can help [place] better understand your patient needs as well as improve your experience and satisfaction. WE CARE ABOUT YOU!!!   Discontinue baby aspirin Take ranitidine 150 mg or Zantac, the equate brand at [facility] and take 1 twice daily before breakfast and supper Restart the albuterol nebulizer and at least use it once daily as recommended by the pulmonologist Continue with headache management as recommended by the neurologist, Dr. Jannifer Franklin Continue to work on diminishing the caffeine intake as much as possible We will call with lab work results as soon as these results become available We will schedule a visit with orthopedic surgeon to look at your right shoulder pain when he is here on his next visit.

## 2017-11-22 LAB — BMP8+EGFR
BUN / CREAT RATIO: 14 (ref 10–24)
BUN: 20 mg/dL (ref 10–36)
CALCIUM: 8.9 mg/dL (ref 8.6–10.2)
CHLORIDE: 107 mmol/L — AB (ref 96–106)
CO2: 24 mmol/L (ref 20–29)
Creatinine, Ser: 1.38 mg/dL — ABNORMAL HIGH (ref 0.76–1.27)
GFR calc non Af Amer: 45 mL/min/{1.73_m2} — ABNORMAL LOW (ref >59)
GFR, EST AFRICAN AMERICAN: 52 mL/min/{1.73_m2} — AB (ref >59)
Glucose: 94 mg/dL (ref 65–99)
POTASSIUM: 5.1 mmol/L (ref 3.5–5.2)
SODIUM: 142 mmol/L (ref 134–144)

## 2017-11-22 LAB — CBC WITH DIFFERENTIAL/PLATELET
BASOS ABS: 0 10*3/uL (ref 0.0–0.2)
Basos: 0 %
EOS (ABSOLUTE): 0.3 10*3/uL (ref 0.0–0.4)
Eos: 5 %
HEMOGLOBIN: 13.8 g/dL (ref 13.0–17.7)
Hematocrit: 42.1 % (ref 37.5–51.0)
Immature Grans (Abs): 0 10*3/uL (ref 0.0–0.1)
Immature Granulocytes: 0 %
LYMPHS ABS: 1.8 10*3/uL (ref 0.7–3.1)
Lymphs: 26 %
MCH: 30 pg (ref 26.6–33.0)
MCHC: 32.8 g/dL (ref 31.5–35.7)
MCV: 92 fL (ref 79–97)
MONOS ABS: 0.6 10*3/uL (ref 0.1–0.9)
Monocytes: 9 %
NEUTROS ABS: 4.3 10*3/uL (ref 1.4–7.0)
Neutrophils: 60 %
PLATELETS: 246 10*3/uL (ref 150–450)
RBC: 4.6 x10E6/uL (ref 4.14–5.80)
RDW: 13.6 % (ref 12.3–15.4)
WBC: 7.1 10*3/uL (ref 3.4–10.8)

## 2017-11-22 LAB — THYROID PANEL WITH TSH
FREE THYROXINE INDEX: 1.7 (ref 1.2–4.9)
T3 Uptake Ratio: 25 % (ref 24–39)
T4, Total: 6.9 ug/dL (ref 4.5–12.0)
TSH: 3.46 u[IU]/mL (ref 0.450–4.500)

## 2017-11-22 LAB — HEPATIC FUNCTION PANEL
ALBUMIN: 3.9 g/dL (ref 3.2–4.6)
ALK PHOS: 76 IU/L (ref 39–117)
ALT: 10 IU/L (ref 0–44)
AST: 15 IU/L (ref 0–40)
BILIRUBIN TOTAL: 0.3 mg/dL (ref 0.0–1.2)
Bilirubin, Direct: 0.1 mg/dL (ref 0.00–0.40)
TOTAL PROTEIN: 6.2 g/dL (ref 6.0–8.5)

## 2017-11-22 LAB — LIPID PANEL
CHOLESTEROL TOTAL: 159 mg/dL (ref 100–199)
Chol/HDL Ratio: 2.9 ratio (ref 0.0–5.0)
HDL: 54 mg/dL (ref >39)
LDL CALC: 88 mg/dL (ref 0–99)
TRIGLYCERIDES: 86 mg/dL (ref 0–149)
VLDL Cholesterol Cal: 17 mg/dL (ref 5–40)

## 2017-11-22 LAB — VITAMIN B12: Vitamin B-12: >2000 pg/mL — ABNORMAL HIGH (ref 232–1245)

## 2017-11-22 LAB — VITAMIN D 25 HYDROXY (VIT D DEFICIENCY, FRACTURES): Vit D, 25-Hydroxy: 65.1 ng/mL (ref 30.0–100.0)

## 2018-02-12 DIAGNOSIS — H11139 Conjunctival pigmentations, unspecified eye: Secondary | ICD-10-CM | POA: Diagnosis not present

## 2018-02-12 DIAGNOSIS — R0602 Shortness of breath: Secondary | ICD-10-CM | POA: Diagnosis not present

## 2018-02-12 DIAGNOSIS — K5900 Constipation, unspecified: Secondary | ICD-10-CM | POA: Diagnosis not present

## 2018-02-12 DIAGNOSIS — Z7982 Long term (current) use of aspirin: Secondary | ICD-10-CM | POA: Diagnosis not present

## 2018-02-12 DIAGNOSIS — R5383 Other fatigue: Secondary | ICD-10-CM | POA: Diagnosis not present

## 2018-02-12 DIAGNOSIS — Z800 Family history of malignant neoplasm of digestive organs: Secondary | ICD-10-CM | POA: Diagnosis not present

## 2018-02-12 DIAGNOSIS — Z86010 Personal history of colonic polyps: Secondary | ICD-10-CM | POA: Diagnosis not present

## 2018-02-12 DIAGNOSIS — K5909 Other constipation: Secondary | ICD-10-CM | POA: Diagnosis not present

## 2018-02-12 DIAGNOSIS — R12 Heartburn: Secondary | ICD-10-CM | POA: Diagnosis not present

## 2018-02-12 DIAGNOSIS — R0609 Other forms of dyspnea: Secondary | ICD-10-CM | POA: Diagnosis not present

## 2018-02-17 IMAGING — US US EXTREM LOW VENOUS BILAT
1 series · 13 of 24 positions shown · non-contrast
Comparison: None.

CLINICAL DATA: Bilateral lower extremity pain. Symptoms are worse
on the right side.



[Series 1: us extrem low venous bilat · 0.05mm/px · 13 of 77 slices shown]
[im 1/77]
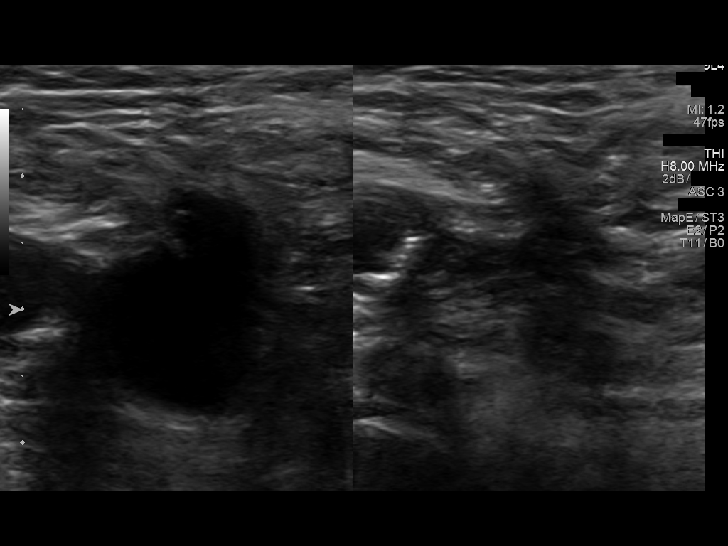
[im 7/77]
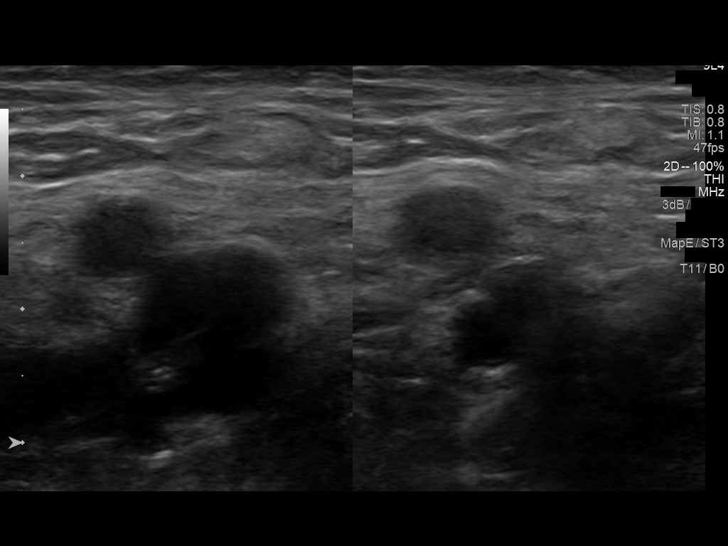
[im 14/77]
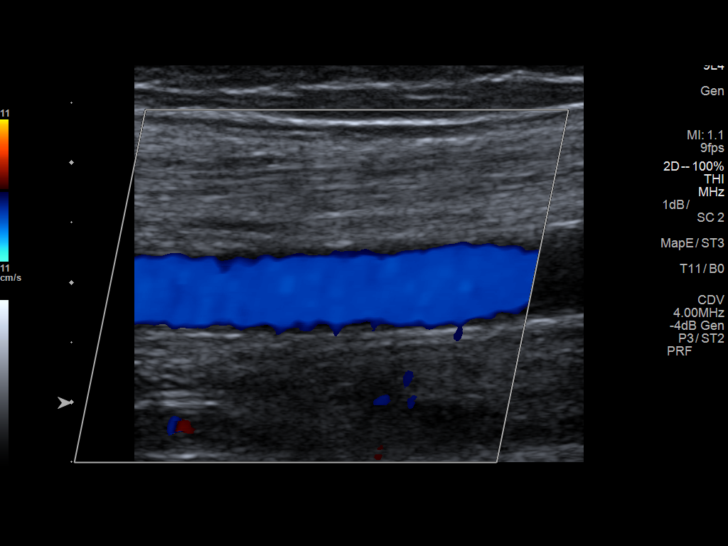
[im 20/77]
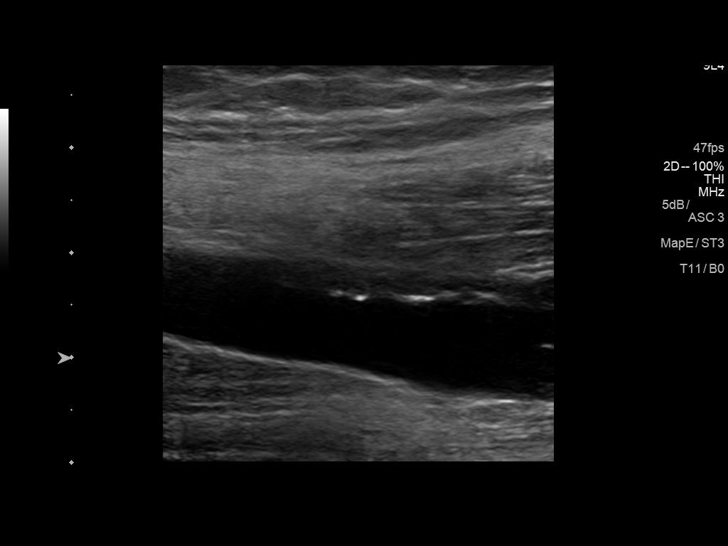
[im 27/77]
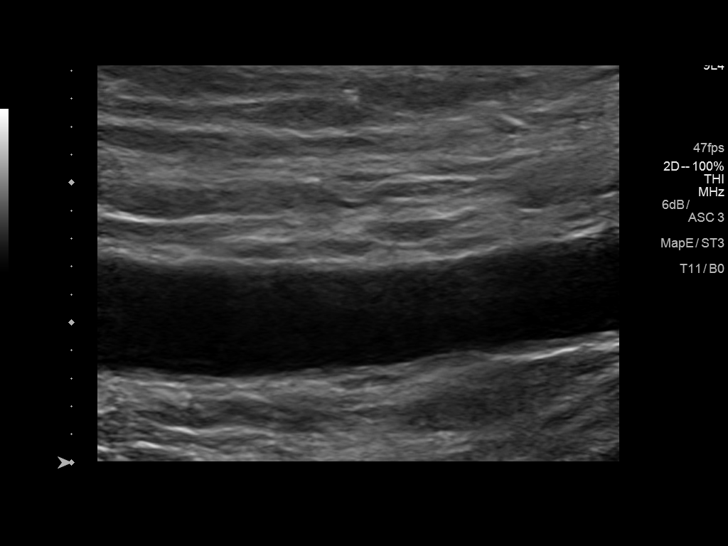
[im 34/77]
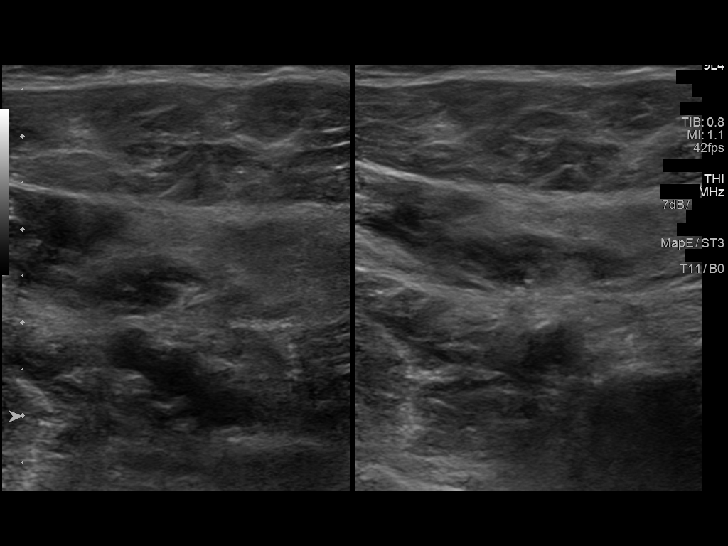
[im 40/77]
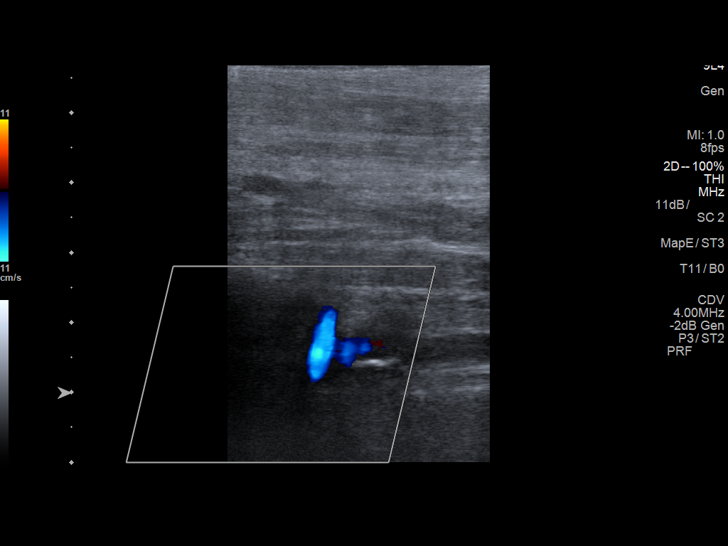
[im 43/77]
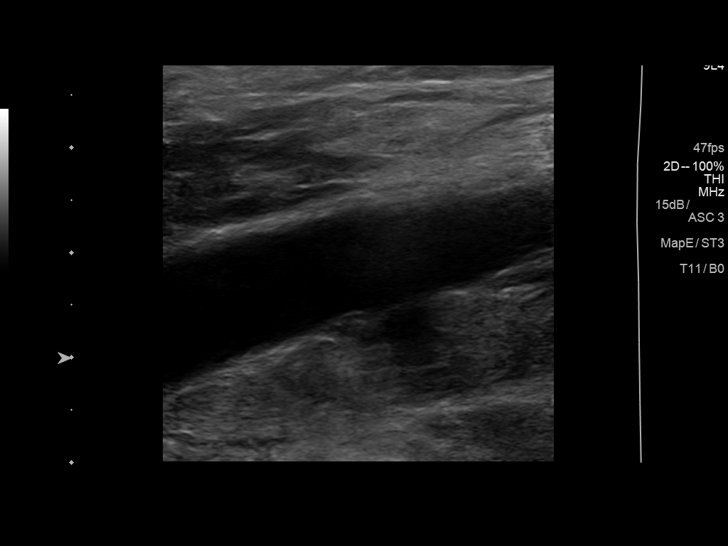
[im 50/77]
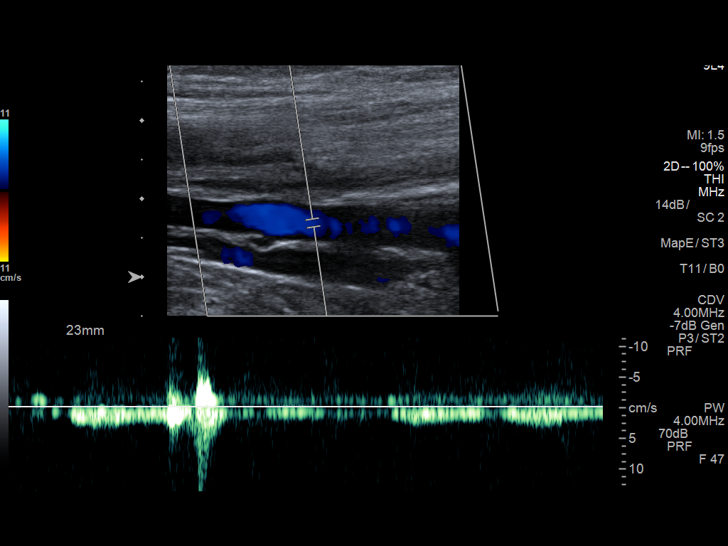
[im 57/77]
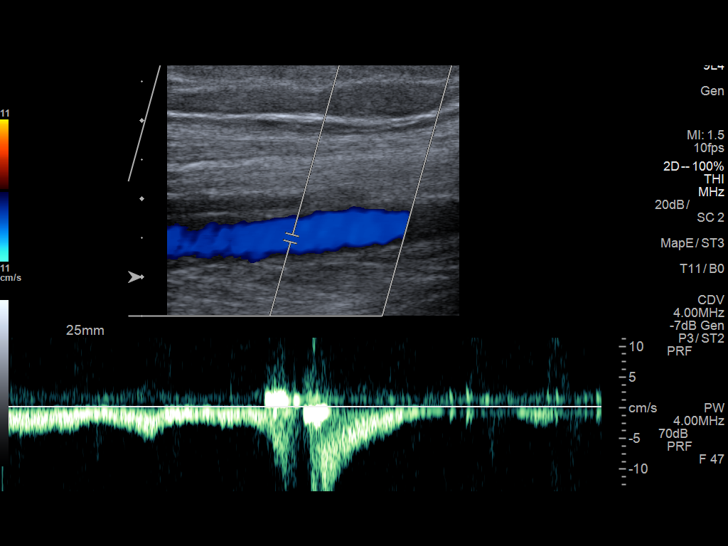
[im 63/77]
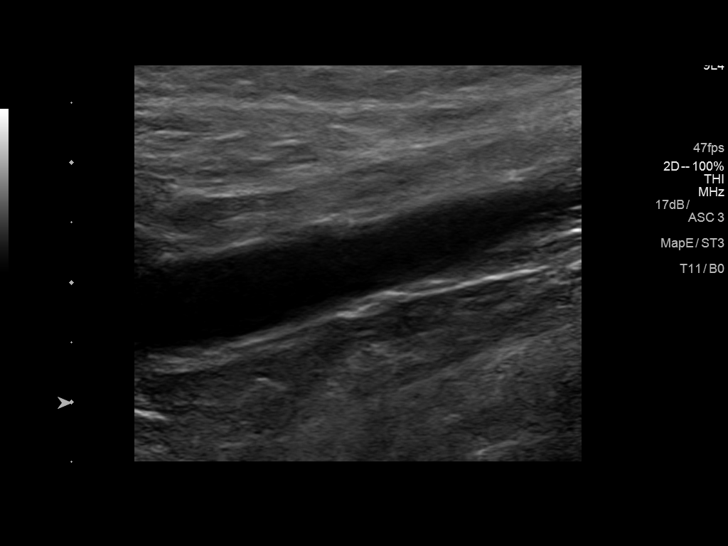
[im 70/77]
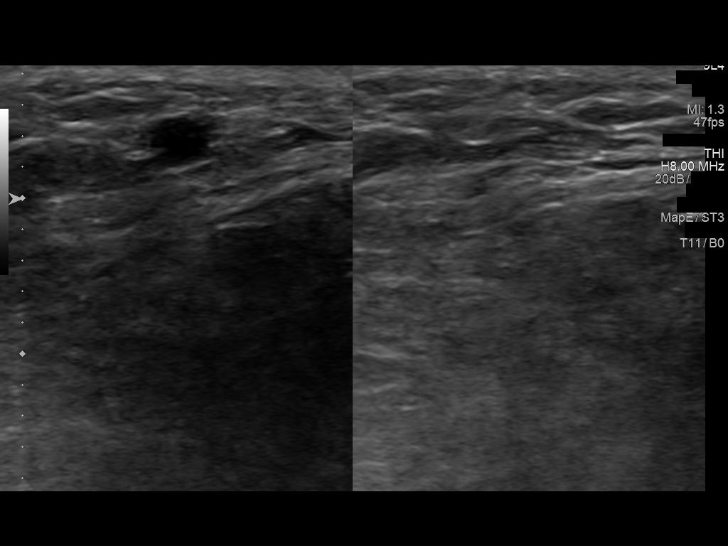
[im 77/77]
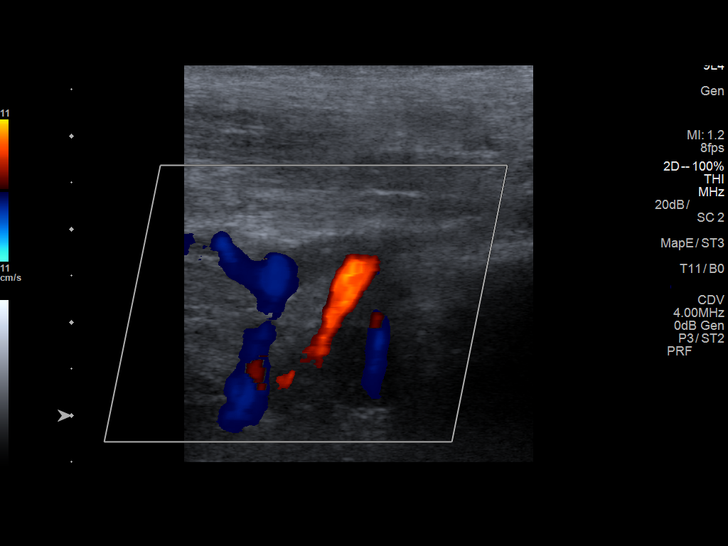

[13 of 24 positions shown; findings below may reference images not displayed]

FINDINGS: RIGHT LOWER EXTREMITY

Common Femoral Vein: No evidence of thrombus. Normal
compressibility, respiratory phasicity and response to augmentation.

Saphenofemoral Junction: No evidence of thrombus. Normal
compressibility and flow on color Doppler imaging.

Profunda Femoral Vein: No evidence of thrombus. Normal
compressibility and flow on color Doppler imaging.

Femoral Vein: No evidence of thrombus. Normal compressibility,
respiratory phasicity and response to augmentation.

Popliteal Vein: No evidence of thrombus. Normal compressibility,
respiratory phasicity and response to augmentation.

Calf Veins: No evidence of thrombus. Normal compressibility and flow
on color Doppler imaging.

Superficial Great Saphenous Vein: No evidence of thrombus. Normal
compressibility and flow on color Doppler imaging.

LEFT LOWER EXTREMITY

Common Femoral Vein: No evidence of thrombus. Normal
compressibility, respiratory phasicity and response to augmentation.

Saphenofemoral Junction: No evidence of thrombus. Normal
compressibility and flow on color Doppler imaging.

Profunda Femoral Vein: No evidence of thrombus. Normal
compressibility and flow on color Doppler imaging.

Femoral Vein: No evidence of thrombus. Normal compressibility,
respiratory phasicity and response to augmentation.

Popliteal Vein: No evidence of thrombus. Normal compressibility,
respiratory phasicity and response to augmentation.

Calf Veins: No evidence of thrombus. Normal compressibility and flow
on color Doppler imaging.

Superficial Great Saphenous Vein: No evidence of thrombus. Normal
compressibility and flow on color Doppler imaging.
IMPRESSION: No evidence of deep venous thrombosis.

## 2018-02-20 ENCOUNTER — Other Ambulatory Visit: Payer: Medicare Other

## 2018-02-20 ENCOUNTER — Encounter: Payer: Self-pay | Admitting: Pulmonary Disease

## 2018-02-20 ENCOUNTER — Ambulatory Visit (INDEPENDENT_AMBULATORY_CARE_PROVIDER_SITE_OTHER): Payer: Medicare Other | Admitting: Pulmonary Disease

## 2018-02-20 DIAGNOSIS — J479 Bronchiectasis, uncomplicated: Secondary | ICD-10-CM

## 2018-02-20 DIAGNOSIS — K219 Gastro-esophageal reflux disease without esophagitis: Secondary | ICD-10-CM | POA: Diagnosis not present

## 2018-02-20 MED ORDER — ALBUTEROL SULFATE (2.5 MG/3ML) 0.083% IN NEBU: 2.5000 mg | INHALATION_SOLUTION | Freq: Four times a day (QID) | RESPIRATORY_TRACT | 3 refills | Status: DC | PRN

## 2018-02-20 MED ORDER — SODIUM CHLORIDE 3 % IN NEBU: INHALATION_SOLUTION | Freq: Two times a day (BID) | RESPIRATORY_TRACT | 3 refills | Status: DC

## 2018-02-20 NOTE — Progress Notes (Signed)
Subjective:   PATIENT ID: Charles Golden GENDER: male DOB: [birth year], MRN: [id number]  Synopsis: Former patient of Dr. Ashok Cordia with bronchiectasis and GERD.  He had pneumonia that hospitalized him as a child on VJ Day 1943   HPI  Chief Complaint  Patient presents with  . Follow-up    increased shortness of breath, productive cough (clear to yellow)   Charles Golden reports that he is feeling more fatigue since turning 89.  He says that he decided to stop using albuterol and hypertonic saline for some reason.  He says that his mucus production has not been too significant lately.  He is usually producing clear mucus when he does cough something up.  He has not had bronchitis or pneumonia since the last visit.  His granddaughter dropped his flutter valve in the toilet so he has not been using that for a few weeks.  He says that he had a sleep study a number of years ago which was normal.  He does wake up many times at night to go to the bathroom.  Past Medical History:  Diagnosis Date  . Bronchitis, chronic ([place])   . COPD (chronic obstructive pulmonary disease) ([place])   . Enlarged prostate   . GERD (gastroesophageal reflux disease)   . Glucagonoma   . Hernia, incisional    at present  . HOH (hard of hearing)   . IBS (irritable bowel syndrome)   . Sciatic pain   . Sinus congestion      Review of Systems  Constitutional: Negative for chills and malaise/fatigue.  HENT: Negative for congestion and nosebleeds.   Respiratory: Positive for cough and sputum production. Negative for shortness of breath, wheezing and stridor.   Cardiovascular: Negative for chest pain, claudication and leg swelling.  Neurological: Negative for weakness.      Objective:  Physical Exam   Vitals:   02/20/18 1207  BP: 138/70  Pulse: 73  SpO2: 95%  Weight: 174 lb 12.8 oz (79.3 kg)  Height: 5' 10.67" (1.795 m)    Gen: well appearing HENT: OP clear, TM's clear, neck supple PULM: CTA B, normal  percussion CV: RRR, no mgr, trace edema GI: BS+, soft, nontender Derm: no cyanosis or rash Psyche: normal mood and affect   CBC    Component Value Date/Time   WBC 7.1 11/21/2017 1112   WBC 7.5 08/23/2014 0937   WBC 8.2 07/16/2014 1400   RBC 4.60 11/21/2017 1112   RBC 4.68 (A) 08/23/2014 0937   RBC 4.50 07/16/2014 1400   HGB 13.8 11/21/2017 1112   HCT 42.1 11/21/2017 1112   PLT 246 11/21/2017 1112   MCV 92 11/21/2017 1112   MCH 30.0 11/21/2017 1112   MCH 28.4 08/23/2014 0937   MCH 30.4 07/16/2014 1400   MCHC 32.8 11/21/2017 1112   MCHC 30.9 (A) 08/23/2014 0937   MCHC 31.9 07/16/2014 1400   RDW 13.6 11/21/2017 1112   LYMPHSABS 1.8 11/21/2017 1112   MONOABS 0.6 09/09/2013 1031   EOSABS 0.3 11/21/2017 1112   BASOSABS 0.0 11/21/2017 1112     PFT 12/20/15: FVC 3.41 L (91%) FEV1 2.21 L (85%) FEV1/FVC 0.65 FEF 25-75 1.09 L (68%) negative bronchodilator response 07/13/15: FVC 3.63 L (96%) FEV1 2.40 L (92%) FEV1/FVC 0.66 FEF 25-75 1.17 L (71%) positive bronchodilator response TLC 8.07 L (112%) RV 143% ERV 106% DLCO corrected 75%  06/22/13: FVC 3.74 L (89%) FEV1 2.39 L (77%) FEV1/FVC 0.64 FEF 25-75 1.17 L (46%)  6MWT 12/21/15:  Walked 360 meters / Baseline Sat 99% on RA / Nadir Sat 97% 07/13/15:  Walked 360 meters / Baseline Sat 97% on RA / Nadir Sat 97% on RA   POLYSOMNOGRAM (11/15/16):  PLMS index of 49.47. Supine AHI 12.5 events per hour. Lowest saturation 88%. Overall AHI 3.6 events/hr.   IMAGING CT CHEST W/O 04/19/15: images reviewed: mild cylindrical bronchiectasis in bases, calcified mediastinal nodes   MICROBIOLOGY 03/2017 Sputum culture > neg Sputum AFB Ctx (11/14/16):  Negative Sputum Ctx (06/01/16):  Oral Flora / Fungus negative / AFB negative  Sputum Ctx (04/13/16):  Oral Flora  Sputum Ctx (11/09/15):  Oral Flora / Fungus negative / AFB negative Sputum Ctx (04/19/15): Oral Flora/AFB negative/Fungal negative  LABS 05/30/16 CBC:   8.9/12.3/38.5/267  04/11/15 Alpha-1 antitrypsin: MM (153) IgM: 31 IgA: 195 IgG: 822        Assessment & Plan:   No diagnosis found.  Discussion: Charles Golden reports worsening fatigue.  It is not clear to me what is causing this.  Of note in 2018 his O2 saturation was borderline while sleeping so I wonder if nocturnal hypoxemia could cause this.  I explained to him today that fatigue is quite a nonspecific finding.  It does bother me that he has not been using his medications regularly though he does not report symptoms of worsening bronchiectasis to be an explanation for his fatigue.  I would like for him to resume regular mucociliary clearance measures because certainly ongoing bronchiectasis could contribute to some degree to his fatigue and he is not doing anything to help with that right now.  Plan: Bronchiectasis: This term means that you have bigger and more dilated airways and retain more mucus You really need to make sure that you focus on clearing mucus out twice a day: To that and I really want you to use the albuterol twice a day and the hypertonic saline twice a day I will prescribe another flutter valve type device called an [place] a flu shot with your primary care doctor Practice good hand hygiene  Worsening fatigue, new problem: We will check your oxygen level while walking today (normal), We will check your oxygen level while sleeping Follow-up with primary care We will check your mucus for signs of an infection, please provide [place] with a sample  We will see you back in 3 months to make sure things are going okay     Current Outpatient Medications:  .  albuterol (PROVENTIL) (2.5 MG/3ML) 0.083% nebulizer solution, Take 3 mLs (2.5 mg total) by nebulization every 6 (six) hours as needed for wheezing or shortness of breath., Disp: 75 mL, Rfl: 3 .  azelastine (ASTELIN) 0.1 % nasal spray, PLACE 1 SPRAY INTO THE NOSE 1 DAY OR 1 DOSE. USE IN EACH NOSTRIL AS DIRECTED,  Disp: 30 mL, Rfl: 11 .  Cholecalciferol (VITAMIN D3) 2000 UNITS TABS, Take 1 tablet by mouth daily., Disp: , Rfl:  .  Cyanocobalamin (VITAMIN B-12) 5000 MCG SUBL, Place 1 tablet under the tongue daily., Disp: , Rfl:  .  doxazosin (CARDURA) 8 MG tablet, TAKE 1/2 TO 1 TABLET BY MOUTH DAILY, Disp: 30 tablet, Rfl: 4 .  finasteride (PROSCAR) 5 MG tablet, Take 1 tablet by mouth daily., Disp: , Rfl: 10 .  fluticasone (FLONASE) 50 MCG/ACT nasal spray, 1 spray in each nostril qd, Disp: 16 g, Rfl: 11 .  guaiFENesin (MUCINEX) 600 MG 12 hr tablet, Take 600 mg by mouth as needed. Reported on 05/25/2015, Disp: , Rfl:  .  latanoprost (XALATAN) 0.005 % ophthalmic solution, Place 1 drop into the left eye at bedtime., Disp: 2.5 mL, Rfl: 1 .  Respiratory Therapy Supplies (FLUTTER) DEVI, 1 Device by Other route as needed., Disp: 1 each, Rfl: 0 .  sodium chloride HYPERTONIC 3 % nebulizer solution, Take by nebulization 2 (two) times daily., Disp: 750 mL, Rfl: 3

## 2018-02-20 NOTE — Patient Instructions (Signed)
Bronchiectasis: This term means that you have bigger and more dilated airways and retain more mucus You really need to make sure that you focus on clearing mucus out twice a day: To that and I really want you to use the albuterol twice a day and the hypertonic saline twice a day I will prescribe another flutter valve type device called an [place] a flu shot with your primary care doctor Practice good hand hygiene  Worsening fatigue, new problem: We will check your oxygen level while walking today (normal), We will check your oxygen level while sleeping Follow-up with primary care We will check your mucus for signs of an infection, please provide [place] with a sample  We will see you back in 3 months to make sure things are going okay

## 2018-03-07 ENCOUNTER — Ambulatory Visit (INDEPENDENT_AMBULATORY_CARE_PROVIDER_SITE_OTHER): Payer: Medicare Other

## 2018-03-07 DIAGNOSIS — Z23 Encounter for immunization: Secondary | ICD-10-CM

## 2018-03-17 DIAGNOSIS — R198 Other specified symptoms and signs involving the digestive system and abdomen: Secondary | ICD-10-CM | POA: Diagnosis not present

## 2018-03-17 DIAGNOSIS — K219 Gastro-esophageal reflux disease without esophagitis: Secondary | ICD-10-CM | POA: Diagnosis not present

## 2018-03-17 DIAGNOSIS — Z800 Family history of malignant neoplasm of digestive organs: Secondary | ICD-10-CM | POA: Diagnosis not present

## 2018-03-17 DIAGNOSIS — R51 Headache: Secondary | ICD-10-CM | POA: Diagnosis not present

## 2018-03-17 DIAGNOSIS — K5900 Constipation, unspecified: Secondary | ICD-10-CM | POA: Diagnosis not present

## 2018-03-21 ENCOUNTER — Encounter: Payer: Medicare Other | Admitting: *Deleted

## 2018-03-21 LAB — RESPIRATORY CULTURE OR RESPIRATORY AND SPUTUM CULTURE
MICRO NUMBER:: 91158687
RESULT: NORMAL
SPECIMEN QUALITY:: ADEQUATE

## 2018-03-21 LAB — FUNGUS CULTURE W SMEAR
MICRO NUMBER:: 91158686
SPECIMEN QUALITY:: ADEQUATE

## 2018-03-24 ENCOUNTER — Ambulatory Visit (INDEPENDENT_AMBULATORY_CARE_PROVIDER_SITE_OTHER): Payer: Medicare Other | Admitting: Neurology

## 2018-03-24 ENCOUNTER — Encounter: Payer: Self-pay | Admitting: Neurology

## 2018-03-24 ENCOUNTER — Other Ambulatory Visit: Payer: Self-pay

## 2018-03-24 ENCOUNTER — Encounter

## 2018-03-24 VITALS — BP 126/74 | HR 63 | Resp 18 | Ht 70.5 in | Wt 176.5 lb

## 2018-03-24 DIAGNOSIS — G441 Vascular headache, not elsewhere classified: Secondary | ICD-10-CM

## 2018-03-24 MED ORDER — TOPIRAMATE 25 MG PO TABS: 25.0000 mg | ORAL_TABLET | Freq: Every day | ORAL | 3 refills | Status: DC

## 2018-03-24 NOTE — Progress Notes (Signed)
Reason for visit: Headache  Charles Golden is an 84 y.o. male  History of present illness:  Charles Golden is a 84 year old right-handed white male with a history of intractable headache.  The patient began having headaches only 5 or 6 years ago, he did not have headaches throughout his life.  The patient drinks about 1.5 cups of coffee daily, he is now using regular coffee mixed half-and-half with decaf coffee.  The patient still using Excedrin migraines, he has not been able to stop this medication.  Indicates that the Excedrin tablets will eliminate the headache rapidly.  Headaches can be severe but are not associated with nausea or vomiting, photophobia or phonophobia.  The patient made a record of his headaches, over the last 25 days, the patient had 16 headache days.  The patient may get a headache after nap during the day, the headaches may come on anytime.  He returns to this office for an evaluation.  Past Medical History:  Diagnosis Date  . Bronchitis, chronic ([place])   . COPD (chronic obstructive pulmonary disease) ([place])   . Enlarged prostate   . GERD (gastroesophageal reflux disease)   . Glucagonoma   . Hernia, incisional    at present  . HOH (hard of hearing)   . IBS (irritable bowel syndrome)   . Sciatic pain   . Sinus congestion     Past Surgical History:  Procedure Laterality Date  . BOWEL RESECTION N/A 09/04/2012   Procedure: SMALL BOWEL RESECTION;  Surgeon: Donato Heinz, MD;  Location: [facility];  Service: General;  Laterality: N/A;  Anastimosis  . HEMORRHOID SURGERY    . HERNIA REPAIR Right 70's  . INGUINAL HERNIA REPAIR Left 09/14/2013   Procedure: LEFT INGUINAL HERNIORRHAPHY;  Surgeon: Jamesetta So, MD;  Location: [facility];  Service: General;  Laterality: Left;  . INSERTION OF MESH Left 09/14/2013   Procedure: INSERTION OF MESH;  Surgeon: Jamesetta So, MD;  Location: [facility];  Service: General;  Laterality: Left;  . KNEE ARTHROSCOPY Left 07/21/2014   Procedure: LEFT  KNEE ARTHROSCOPY WITH MEDIAL MENISCAL DEBRIDEMENT ;  Surgeon: Gearlean Alf, MD;  Location: [facility];  Service: Orthopedics;  Laterality: Left;  . LAPAROTOMY N/A 09/04/2012   Procedure: EXPLORATORY LAPAROTOMY;  Surgeon: Donato Heinz, MD;  Location: [facility];  Service: General;  Laterality: N/A;  . SKIN LESION EXCISION     [name]  . TONSILLECTOMY      Family History  Problem Relation Age of Onset  . Heart disease Mother        Valve replacement and pacemaker  . Colon cancer Father   . Asthma Unknown        cousin  . Healthy Daughter   . Healthy Daughter   . Healthy Daughter     Social history:  reports that he quit smoking about 11 years ago. His smoking use included cigarettes. He has a 30.00 pack-year smoking history. He has never used smokeless tobacco. He reports that he drinks alcohol. He reports that he does not use drugs.    Allergies  Allergen Reactions  . Demerol [Meperidine] Other (See Comments)    Pt. States "he woke up during a colonoscopy"  . Spiriva Handihaler [Tiotropium Bromide Monohydrate] Other (See Comments)    Mild Urinary Retention    Medications:  Prior to Admission medications   Medication Sig Start Date End Date Taking? Authorizing Provider  albuterol (PROVENTIL) (2.5 MG/3ML) 0.083% nebulizer solution Take  3 mLs (2.5 mg total) by nebulization every 6 (six) hours as needed for wheezing or shortness of breath. 02/20/18  Yes Juanito Doom, MD  azelastine (ASTELIN) 0.1 % nasal spray PLACE 1 SPRAY INTO THE NOSE 1 DAY OR 1 DOSE. USE IN EACH NOSTRIL AS DIRECTED 07/24/17  Yes Chipper Herb, MD  Cholecalciferol (VITAMIN D3) 2000 UNITS TABS Take 1 tablet by mouth daily.   Yes [provider]  Cyanocobalamin (VITAMIN B-12) 5000 MCG SUBL Place 1 tablet under the tongue daily.   Yes [provider]  doxazosin (CARDURA) 8 MG tablet TAKE 1/2 TO 1 TABLET BY MOUTH DAILY 10/02/16  Yes Chipper Herb, MD  finasteride (PROSCAR) 5 MG tablet Take 1  tablet by mouth daily. 03/19/16  Yes [provider]  fluticasone Asencion Islam) 50 MCG/ACT nasal spray 1 spray in each nostril qd 07/24/17  Yes Chipper Herb, MD  guaiFENesin (MUCINEX) 600 MG 12 hr tablet Take 600 mg by mouth as needed. Reported on 05/25/2015   Yes [provider]  latanoprost (XALATAN) 0.005 % ophthalmic solution Place 1 drop into the left eye at bedtime. 06/01/16  Yes Chipper Herb, MD  Respiratory Therapy Supplies (FLUTTER) DEVI 1 Device by Other route as needed. 07/05/16  Yes Javier Glazier, MD  sodium chloride HYPERTONIC 3 % nebulizer solution Take by nebulization 2 (two) times daily. 02/20/18  Yes Juanito Doom, MD    ROS:  Out of a complete 14 system review of symptoms, the patient complains only of the following symptoms, and all other reviewed systems are negative.  Fatigue Hearing loss Shortness of breath Headache  Blood pressure 126/74, pulse 63, resp. rate 18, height 5' 10.5" (1.791 m), weight 176 lb 8 oz (80.1 kg).  Physical Exam  General: The patient is alert and cooperative at the time of the examination.  Skin: No significant peripheral edema is noted.   Neurologic Exam  Mental status: The patient is alert and oriented x 3 at the time of the examination. The patient has apparent normal recent and remote memory, with an apparently normal attention span and concentration ability.   Cranial nerves: Facial symmetry is present. Speech is normal, no aphasia or dysarthria is noted. Extraocular movements are full. Visual fields are full.  Motor: The patient has good strength in all 4 extremities.  Sensory examination: Soft touch sensation is symmetric on the face, arms, and legs.  Coordination: The patient has good finger-nose-finger and heel-to-shin bilaterally.  Gait and station: The patient has a normal gait. Tandem gait is unsteady. Romberg is negative. No drift is seen.  Reflexes: Deep tendon reflexes are  symmetric.   Assessment/Plan:  1.  Intractable headache  The patient has had a lot of difficulty trying to come off of caffeine completely.  He has converted to drinking less coffee, and mixing it with decaf, and he is using decaffeinated soft drinks, he drinks ice tea on occasion.  I have indicated he is to stop the Excedrin Migraine, use Advil instead.  We will try Topamax 25 mg at night, previously he has not been able to tolerate Effexor or Depakote, he did tolerate gabapentin but did not wish to continue the medication.  He will follow-up in 6 months.  Jill Alexanders MD 03/24/2018 8:03 AM  [facility] [address] [place], [place] [address]  Phone [phone] Fax [phone]

## 2018-03-24 NOTE — Patient Instructions (Signed)
Stop the Exedrine migraine tablets.  Use Advil instead.  Start Topamax at night at 25 mg.  Topamax (topiramate) is a seizure medication that has an FDA approval for seizures and for migraine headache. Potential side effects of this medication include weight loss, cognitive slowing, tingling in the fingers and toes, and carbonated drinks will taste bad. If any significant side effects are noted on this drug, please contact our office.

## 2018-03-25 DIAGNOSIS — D1801 Hemangioma of skin and subcutaneous tissue: Secondary | ICD-10-CM | POA: Diagnosis not present

## 2018-03-25 DIAGNOSIS — L821 Other seborrheic keratosis: Secondary | ICD-10-CM | POA: Diagnosis not present

## 2018-03-25 DIAGNOSIS — D229 Melanocytic nevi, unspecified: Secondary | ICD-10-CM | POA: Diagnosis not present

## 2018-03-25 DIAGNOSIS — L708 Other acne: Secondary | ICD-10-CM | POA: Diagnosis not present

## 2018-03-25 DIAGNOSIS — Z85828 Personal history of other malignant neoplasm of skin: Secondary | ICD-10-CM | POA: Diagnosis not present

## 2018-03-28 ENCOUNTER — Ambulatory Visit (INDEPENDENT_AMBULATORY_CARE_PROVIDER_SITE_OTHER): Payer: Medicare Other | Admitting: Family Medicine

## 2018-03-28 ENCOUNTER — Encounter: Payer: Self-pay | Admitting: Family Medicine

## 2018-03-28 VITALS — BP 95/59 | Temp 96.7°F | Ht 70.5 in | Wt 173.0 lb

## 2018-03-28 DIAGNOSIS — R519 Headache, unspecified: Secondary | ICD-10-CM

## 2018-03-28 DIAGNOSIS — R5383 Other fatigue: Secondary | ICD-10-CM

## 2018-03-28 DIAGNOSIS — J449 Chronic obstructive pulmonary disease, unspecified: Secondary | ICD-10-CM | POA: Diagnosis not present

## 2018-03-28 DIAGNOSIS — R51 Headache: Secondary | ICD-10-CM | POA: Diagnosis not present

## 2018-03-28 DIAGNOSIS — E559 Vitamin D deficiency, unspecified: Secondary | ICD-10-CM

## 2018-03-28 DIAGNOSIS — K219 Gastro-esophageal reflux disease without esophagitis: Secondary | ICD-10-CM

## 2018-03-28 DIAGNOSIS — J479 Bronchiectasis, uncomplicated: Secondary | ICD-10-CM | POA: Diagnosis not present

## 2018-03-28 DIAGNOSIS — N400 Enlarged prostate without lower urinary tract symptoms: Secondary | ICD-10-CM | POA: Diagnosis not present

## 2018-03-28 DIAGNOSIS — I700 Atherosclerosis of aorta: Secondary | ICD-10-CM | POA: Diagnosis not present

## 2018-03-28 NOTE — Patient Instructions (Addendum)
Medicare Annual Wellness Visit  [facility] and the medical providers at [facility] strive to bring you the best medical care.  In doing so we not only want to address your current medical conditions and concerns but also to detect new conditions early and prevent illness, disease and health-related problems.    Medicare offers a yearly Wellness Visit which allows our clinical staff to assess your need for preventative services including immunizations, lifestyle education, counseling to decrease risk of preventable diseases and screening for fall risk and other medical concerns.    This visit is provided free of charge (no copay) for all Medicare recipients. The clinical pharmacists at [facility] have begun to conduct these Wellness Visits which will also include a thorough review of all your medications.    As you primary medical provider recommend that you make an appointment for your Annual Wellness Visit if you have not done so already this year.  You may set up this appointment before you leave today or you may call back ([phone]) and schedule an appointment.  Please make sure when you call that you mention that you are scheduling your Annual Wellness Visit with the clinical pharmacist so that the appointment may be made for the proper length of time.     Continue current medications. Continue good therapeutic lifestyle changes which include good diet and exercise. Fall precautions discussed with patient. If an FOBT was given today- please return it to our front desk. If you are over 76 years old - you may need Prevnar 55 or the adult Pneumonia vaccine.  **Flu shots are available--- please call and schedule a FLU-CLINIC appointment**  After your visit with [place] today you will receive a survey in the mail or online from [employer] regarding your care with [place]. Please take a moment to fill this out. Your feedback is very  important to [place] as you can help [place] better understand your patient needs as well as improve your experience and satisfaction. WE CARE ABOUT YOU!!!   Continue to follow-up with neurology and pulmonology and urology as planned. Continue to follow-up with gastroenterology as planned Stay active but avoid climbing Drink plenty of fluids and stay well-hydrated Continue to take Mucinex and use inhalers as directed by pulmonology Keep the house as cool as possible and avoid the use of overhead fans or oscillating fans that dry your airways out too much

## 2018-03-28 NOTE — Progress Notes (Signed)
Subjective:    Patient ID: Charles Golden, male    DOB: [birth year], 84 y.o.   MRN: [id number]  HPI Pt here for follow up and management of chronic medical problems which includes gerd. He is taking medication regularly.  [name] comes to the visit today by self.  His wife is been very sick recently in the hospital but is doing better.  He wants to discuss vitamins today and fatigue today.  He has been seeing the neurologist regularly and is also seeing the urologist.  He will get lab work today.  His vital signs are stable his blood pressure is 95/59.  He is also having some headache issues for which he sees the neurologist.  He has COPD and GERD.  The patient today denies any chest pain pressure or tightness.  He does have ongoing shortness of breath with his COPD.  He denies any trouble with his stomach other than irritable bowel symptoms of constipation and diarrhea.  He is tending more toward constipation now and does take his MiraLAX regularly and will try to drink more water.  He denies any blood in the stool or black tarry bowel movements.  He says his urine passage is slow and he does see Dr. Tama Golden in once a year and that visit is coming up soon.  His vital signs are stable.    Patient Active Problem List   Diagnosis Date Noted  . Restless leg syndrome 11/25/2016  . Thoracic aortic atherosclerosis ([place]) 05/03/2015  . Upper airway cough syndrome 04/28/2015  . GERD (gastroesophageal reflux disease) 04/11/2015  . Tinea corporis 02/15/2015  . Acute medial meniscal tear 07/20/2014  . COPD GOLD I  12/08/2013  . Small bowel volvulus ([place]) 08/05/2013  . Chronic headache 08/05/2013  . Left inguinal hernia 08/05/2013  . Incisional hernia, without obstruction or gangrene 08/05/2013  . Bronchiectasis with (acute) exacerbation ([place]) 06/22/2013  . Headache 05/01/2013  . Obstructive chronic bronchitis without exacerbation ([place]) 04/30/2013  . Left hip pain 04/29/2013  . BPH (benign prostatic  hyperplasia) 01/05/2013  . IBS (irritable bowel syndrome) 09/03/2012  . Chronic rhinosinusitis 09/03/2012  . Lung nodules 05/04/2003   Outpatient Encounter Medications as of 03/28/2018  Medication Sig  . albuterol (PROVENTIL) (2.5 MG/3ML) 0.083% nebulizer solution Take 3 mLs (2.5 mg total) by nebulization every 6 (six) hours as needed for wheezing or shortness of breath.  Marland Kitchen azelastine (ASTELIN) 0.1 % nasal spray PLACE 1 SPRAY INTO THE NOSE 1 DAY OR 1 DOSE. USE IN EACH NOSTRIL AS DIRECTED  . Cholecalciferol (VITAMIN D3) 2000 UNITS TABS Take 1 tablet by mouth daily.  . Cyanocobalamin (VITAMIN B-12) 5000 MCG SUBL Place 1 tablet under the tongue daily.  Marland Kitchen doxazosin (CARDURA) 8 MG tablet TAKE 1/2 TO 1 TABLET BY MOUTH DAILY  . finasteride (PROSCAR) 5 MG tablet Take 1 tablet by mouth daily.  . fluticasone (FLONASE) 50 MCG/ACT nasal spray 1 spray in each nostril qd  . guaiFENesin (MUCINEX) 600 MG 12 hr tablet Take 600 mg by mouth as needed. Reported on 05/25/2015  . latanoprost (XALATAN) 0.005 % ophthalmic solution Place 1 drop into the left eye at bedtime.  Marland Kitchen Respiratory Therapy Supplies (FLUTTER) DEVI 1 Device by Other route as needed.  . sodium chloride HYPERTONIC 3 % nebulizer solution Take by nebulization 2 (two) times daily.  Marland Kitchen topiramate (TOPAMAX) 25 MG tablet Take 1 tablet (25 mg total) by mouth at bedtime.   No facility-administered encounter medications on file as of  03/28/2018.       Review of Systems  Constitutional: Positive for fatigue.  HENT: Negative.   Eyes: Negative.   Respiratory: Negative.   Cardiovascular: Negative.   Gastrointestinal: Negative.   Endocrine: Negative.   Genitourinary: Negative.   Musculoskeletal: Negative.   Skin: Negative.   Allergic/Immunologic: Negative.   Neurological: Negative.   Hematological: Negative.   Psychiatric/Behavioral: Negative.        Objective:   Physical Exam  Constitutional: He is oriented to person, place, and time. He  appears well-developed and well-nourished. No distress.  The patient is pleasant and alert and looks and acts much younger than his stated age of 8 years.  In general he is feeling well today despite his multiple problems for which she currently has a handle on.  HENT:  Head: Normocephalic and atraumatic.  Right Ear: External ear normal.  Left Ear: External ear normal.  Nose: Nose normal.  Mouth/Throat: Oropharynx is clear and moist. No oropharyngeal exudate.  Throat is clear and no drainage  Eyes: Pupils are equal, round, and reactive to light. Conjunctivae and EOM are normal. Right eye exhibits no discharge. Left eye exhibits no discharge. No scleral icterus.  Neck: Normal range of motion. Neck supple. No thyromegaly present.  No thyromegaly or anterior cervical adenopathy  Cardiovascular: Normal rate, normal heart sounds and intact distal pulses.  No murmur heard. The heart is slightly irregular at 72/min  Pulmonary/Chest: Effort normal. No respiratory distress. He has no wheezes. He has no rales. He exhibits no tenderness.  Slightly decreased air movement but no wheezes rales no axillary adenopathy or chest wall masses  Abdominal: Soft. Bowel sounds are normal. He exhibits no distension and no mass. There is tenderness. There is no guarding.  Patient has a midline incisional hernia with a midline scar.  He has had a volvulus in the past.  He has a right inguinal hernia.  He is obviously slightly tender in these areas.  He has no liver or spleen enlargement.  No epigastric tenderness.  No inguinal adenopathy.  Genitourinary:  Genitourinary Comments: Patient is due urology visit soon  Musculoskeletal: Normal range of motion. He exhibits no edema or tenderness.  Lymphadenopathy:    He has no cervical adenopathy.  Neurological: He is alert and oriented to person, place, and time. He has normal reflexes. No cranial nerve deficit.  Reflexes are 1+ or less bilaterally and equal in lower  extremity  Skin: Skin is warm and dry. No rash noted.  Psychiatric: He has a normal mood and affect. His behavior is normal. Judgment and thought content normal.  [name] mood affect and behavior are normal for him.  Nursing note and vitals reviewed.   BP (!) 95/59 (BP Location: Left Arm)   Temp (!) 96.7 F (35.9 C) (Oral)   Ht 5' 10.5" (1.791 m)   Wt 173 lb (78.5 kg)   BMI 24.47 kg/m        Assessment & Plan:  1. Thoracic aortic atherosclerosis ([place]) -Continue with as aggressive therapeutic lifestyle changes as possible including diet and exercise to control cholesterol and triglycerides - CBC with Differential/Platelet - Lipid panel - Hepatic function panel  2. Vitamin D deficiency -Continue with vitamin D replacement pending results of lab work - CBC with Differential/Platelet - VITAMIN D 25 Hydroxy (Vit-D Deficiency, Fractures)  3. Gastroesophageal reflux disease, esophagitis presence not specified -Continue to watch diet closely and avoid irritating foods - CBC with Differential/Platelet - Hepatic function panel  4.  COPD GOLD I  -Continue with Mucinex and inhalers and follow-up with pulmonology as planned - CBC with Differential/Platelet  5. Other fatigue - BMP8+EGFR - CBC with Differential/Platelet - Lipid panel - Thyroid Panel With TSH  6. Nonintractable headache, unspecified chronicity pattern, unspecified headache type -Follow-up with neurology as planned  7. Bronchiectasis without complication ([place]) -Follow-up with pulmonology as planned  8. Benign prostatic hyperplasia, unspecified whether lower urinary tract symptoms present -Appointment with urology coming up soon as patient does have a slow stream.  9. Obstructive chronic bronchitis without exacerbation ([place]) -Follow-up with pulmonology as planned  Patient Instructions                       Medicare Annual Wellness Visit  [facility] and the medical providers at [facility] strive to bring you the best medical care.  In doing so we not only want to address your current medical conditions and concerns but also to detect new conditions early and prevent illness, disease and health-related problems.    Medicare offers a yearly Wellness Visit which allows our clinical staff to assess your need for preventative services including immunizations, lifestyle education, counseling to decrease risk of preventable diseases and screening for fall risk and other medical concerns.    This visit is provided free of charge (no copay) for all Medicare recipients. The clinical pharmacists at [facility] have begun to conduct these Wellness Visits which will also include a thorough review of all your medications.    As you primary medical provider recommend that you make an appointment for your Annual Wellness Visit if you have not done so already this year.  You may set up this appointment before you leave today or you may call back ([phone]) and schedule an appointment.  Please make sure when you call that you mention that you are scheduling your Annual Wellness Visit with the clinical pharmacist so that the appointment may be made for the proper length of time.     Continue current medications. Continue good therapeutic lifestyle changes which include good diet and exercise. Fall precautions discussed with patient. If an FOBT was given today- please return it to our front desk. If you are over 96 years old - you may need Prevnar 45 or the adult Pneumonia vaccine.  **Flu shots are available--- please call and schedule a FLU-CLINIC appointment**  After your visit with [place] today you will receive a survey in the mail or online from [employer] regarding your care with [place]. Please take a moment to fill this out. Your feedback is very important to [place] as you can help [place] better understand your patient needs as well as improve your experience and satisfaction. WE  CARE ABOUT YOU!!!   Continue to follow-up with neurology and pulmonology and urology as planned. Continue to follow-up with gastroenterology as planned Stay active but avoid climbing Drink plenty of fluids and stay well-hydrated Continue to take Mucinex and use inhalers as directed by pulmonology Keep the house as cool as possible and avoid the use of overhead fans or oscillating fans that dry your airways out too much  Arrie Senate MD

## 2018-03-29 LAB — CBC WITH DIFFERENTIAL/PLATELET
BASOS ABS: 0 10*3/uL (ref 0.0–0.2)
Basos: 1 %
EOS (ABSOLUTE): 0.2 10*3/uL (ref 0.0–0.4)
Eos: 3 %
HEMOGLOBIN: 14 g/dL (ref 13.0–17.7)
Hematocrit: 41.8 % (ref 37.5–51.0)
IMMATURE GRANS (ABS): 0 10*3/uL (ref 0.0–0.1)
Immature Granulocytes: 0 %
Lymphocytes Absolute: 1.8 10*3/uL (ref 0.7–3.1)
Lymphs: 30 %
MCH: 31.1 pg (ref 26.6–33.0)
MCHC: 33.5 g/dL (ref 31.5–35.7)
MCV: 93 fL (ref 79–97)
MONOCYTES: 8 %
Monocytes Absolute: 0.5 10*3/uL (ref 0.1–0.9)
Neutrophils Absolute: 3.6 10*3/uL (ref 1.4–7.0)
Neutrophils: 58 %
PLATELETS: 248 10*3/uL (ref 150–450)
RBC: 4.5 x10E6/uL (ref 4.14–5.80)
RDW: 12.6 % (ref 12.3–15.4)
WBC: 6 10*3/uL (ref 3.4–10.8)

## 2018-03-29 LAB — BMP8+EGFR
BUN/Creatinine Ratio: 15 (ref 10–24)
BUN: 23 mg/dL (ref 10–36)
CALCIUM: 9.3 mg/dL (ref 8.6–10.2)
CHLORIDE: 105 mmol/L (ref 96–106)
CO2: 22 mmol/L (ref 20–29)
Creatinine, Ser: 1.55 mg/dL — ABNORMAL HIGH (ref 0.76–1.27)
GFR calc Af Amer: 45 mL/min/{1.73_m2} — ABNORMAL LOW (ref >59)
GFR calc non Af Amer: 39 mL/min/{1.73_m2} — ABNORMAL LOW (ref >59)
Glucose: 94 mg/dL (ref 65–99)
Potassium: 4.6 mmol/L (ref 3.5–5.2)
Sodium: 142 mmol/L (ref 134–144)

## 2018-03-29 LAB — THYROID PANEL WITH TSH
Free Thyroxine Index: 2.3 (ref 1.2–4.9)
T3 Uptake Ratio: 28 % (ref 24–39)
T4 TOTAL: 8.1 ug/dL (ref 4.5–12.0)
TSH: 2.62 u[IU]/mL (ref 0.450–4.500)

## 2018-03-29 LAB — LIPID PANEL
CHOLESTEROL TOTAL: 161 mg/dL (ref 100–199)
Chol/HDL Ratio: 2.9 ratio (ref 0.0–5.0)
HDL: 55 mg/dL (ref >39)
LDL CALC: 90 mg/dL (ref 0–99)
Triglycerides: 78 mg/dL (ref 0–149)
VLDL Cholesterol Cal: 16 mg/dL (ref 5–40)

## 2018-03-29 LAB — HEPATIC FUNCTION PANEL
ALBUMIN: 4 g/dL (ref 3.2–4.6)
ALK PHOS: 72 IU/L (ref 39–117)
ALT: 9 IU/L (ref 0–44)
AST: 16 IU/L (ref 0–40)
BILIRUBIN, DIRECT: 0.11 mg/dL (ref 0.00–0.40)
Bilirubin Total: 0.3 mg/dL (ref 0.0–1.2)
TOTAL PROTEIN: 6.2 g/dL (ref 6.0–8.5)

## 2018-03-29 LAB — VITAMIN D 25 HYDROXY (VIT D DEFICIENCY, FRACTURES): Vit D, 25-Hydroxy: 56.4 ng/mL (ref 30.0–100.0)

## 2018-04-09 LAB — MYCOBACTERIA,CULT W/FLUOROCHROME SMEAR
MICRO NUMBER: 91158684
SMEAR: NONE SEEN
SPECIMEN QUALITY: ADEQUATE

## 2018-04-15 ENCOUNTER — Encounter: Payer: Medicare Other | Admitting: *Deleted

## 2018-04-15 MED ORDER — DOXYCYCLINE HYCLATE 100 MG PO TABS: 100.0000 mg | ORAL_TABLET | Freq: Two times a day (BID) | ORAL | 0 refills | Status: DC

## 2018-04-15 NOTE — Telephone Encounter (Signed)
Dr. Lake Bells please advise on patients e-mail below. Thank you.

## 2018-05-05 ENCOUNTER — Encounter: Payer: Self-pay | Admitting: *Deleted

## 2018-05-05 ENCOUNTER — Ambulatory Visit: Payer: Medicare Other | Admitting: *Deleted

## 2018-05-05 VITALS — BP 123/75 | HR 67 | Ht 70.5 in | Wt 175.0 lb

## 2018-05-05 DIAGNOSIS — Z0000 Encounter for general adult medical examination without abnormal findings: Secondary | ICD-10-CM | POA: Diagnosis not present

## 2018-05-05 DIAGNOSIS — Z23 Encounter for immunization: Secondary | ICD-10-CM

## 2018-05-05 NOTE — Progress Notes (Signed)
Subjective:   Charles Golden is a 84 y.o. male who presents for Medicare Annual/Subsequent preventive examination.  Charles Golden is retired from [employer].  He enjoys reading and spending time with his family.  He is active in his church, the [employer], and fireman's relief.  He lives at home with his wife.  They have 3 daughters, 6 grandchildren, and 1 great grandchild.  He feels his health is unchanged from last year, though he states he feels his energy level has decreased.  He reports not ER visits, hospitalizations, or surgeries in the past year.   Review of Systems:   All systems negative today  Cardiac Risk Factors include: advanced age (>6men, >32 women);male gender     Objective:    Vitals: BP 123/75   Pulse 67   Ht 5' 10.5" (1.791 m)   Wt 175 lb (79.4 kg)   BMI 24.75 kg/m   Body mass index is 24.75 kg/m.  Advanced Directives 05/05/2018 03/19/2017 12/20/2014 12/02/2014 07/16/2014 06/28/2014 09/09/2013  Does Patient Have a Medical Advance Directive? Yes No Yes No No Yes Patient does not have advance directive;Patient would not like information  Type of Scientist, forensic Power of [place];Living will - [place] -  Does patient want to make changes to medical advance directive? No - Patient declined - - - - - -  Copy of [place] in Chart? No - copy requested - - - - - -  Would patient like information on creating a medical advance directive? - Yes (MAU/Ambulatory/Procedural Areas - Information given) - No - patient declined information No - patient declined information - -  Pre-existing out of facility DNR order (yellow form or pink MOST form) - - - - - - No    Tobacco Social History   Tobacco Use  Smoking Status Former Smoker  . Packs/day: 1.00  . Years: 30.00  . Pack years: 30.00  . Types: Cigarettes  . Last attempt to quit: 05/28/2006  . Years since quitting: 11.9    Smokeless Tobacco Never Used     Counseling given: No   Clinical Intake:     Pain Score: 0-No pain                 Past Medical History:  Diagnosis Date  . Bronchitis, chronic ([place])   . COPD (chronic obstructive pulmonary disease) ([place])   . Enlarged prostate   . GERD (gastroesophageal reflux disease)   . Glucagonoma   . Hernia, incisional    at present  . HOH (hard of hearing)   . IBS (irritable bowel syndrome)   . Sciatic pain   . Sinus congestion    Past Surgical History:  Procedure Laterality Date  . BOWEL RESECTION N/A 09/04/2012   Procedure: SMALL BOWEL RESECTION;  Surgeon: Donato Heinz, MD;  Location: [facility];  Service: General;  Laterality: N/A;  Anastimosis  . HEMORRHOID SURGERY    . HERNIA REPAIR Right 70's  . INGUINAL HERNIA REPAIR Left 09/14/2013   Procedure: LEFT INGUINAL HERNIORRHAPHY;  Surgeon: Jamesetta So, MD;  Location: [facility];  Service: General;  Laterality: Left;  . INSERTION OF MESH Left 09/14/2013   Procedure: INSERTION OF MESH;  Surgeon: Jamesetta So, MD;  Location: [facility];  Service: General;  Laterality: Left;  . KNEE ARTHROSCOPY Left 07/21/2014   Procedure: LEFT KNEE ARTHROSCOPY WITH MEDIAL MENISCAL DEBRIDEMENT ;  Surgeon: Pilar Plate  Zella Ball, MD;  Location: [facility];  Service: Orthopedics;  Laterality: Left;  . LAPAROTOMY N/A 09/04/2012   Procedure: EXPLORATORY LAPAROTOMY;  Surgeon: Donato Heinz, MD;  Location: [facility];  Service: General;  Laterality: N/A;  . SKIN LESION EXCISION     [name]  . TONSILLECTOMY     Family History  Problem Relation Age of Onset  . Heart disease Mother        Valve replacement and pacemaker  . Colon cancer Father   . Asthma Unknown        cousin  . Healthy Daughter   . Healthy Daughter   . Healthy Daughter    Social History   Socioeconomic History  . Marital status: Married    Spouse name: Charles Golden  . Number of children: 3  . Years of education: 12+  . Highest education level: Bachelor's degree  (e.g., BA, AB, BS)  Occupational History  . Occupation: Retired    Comment: Landscape architect  . Financial resource strain: Not hard at all  . Food insecurity:    Worry: Never true    Inability: Never true  . Transportation needs:    Medical: No    Non-medical: No  Tobacco Use  . Smoking status: Former Smoker    Packs/day: 1.00    Years: 30.00    Pack years: 30.00    Types: Cigarettes    Last attempt to quit: 05/28/2006    Years since quitting: 11.9  . Smokeless tobacco: Never Used  Substance and Sexual Activity  . Alcohol use: Not Currently    Alcohol/week: 0.0 standard drinks  . Drug use: No  . Sexual activity: Yes    Birth control/protection: None  Lifestyle  . Physical activity:    Days per week: 0 days    Minutes per session: 0 min  . Stress: Not at all  Relationships  . Social connections:    Talks on phone: More than three times a week    Gets together: More than three times a week    Attends religious service: More than 4 times per year    Active member of club or organization: Yes    Attends meetings of clubs or organizations: More than 4 times per year    Relationship status: Married  Other Topics Concern  . Not on file  Social History Narrative   Patient lives at home with [name].    Patient has 3 children.    Patient has his BS   Patient is retired.    Drinks about 2 cups of coffee per day.      [facility]:   He is still married. 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 He is from [place].        Outpatient Encounter Medications as of 05/05/2018  Medication Sig  . albuterol (PROVENTIL) (2.5 MG/3ML) 0.083% nebulizer solution Take 3 mLs (2.5 mg total) by nebulization every 6 (six) hours as needed for wheezing or shortness of breath.  Marland Kitchen azelastine (ASTELIN) 0.1 % nasal spray PLACE 1 SPRAY INTO THE NOSE 1 DAY OR 1 DOSE. USE IN EACH NOSTRIL AS DIRECTED    . Cholecalciferol (VITAMIN D3) 2000 UNITS TABS Take 1 tablet by mouth daily.  . Cyanocobalamin (VITAMIN B-12) 5000 MCG SUBL Place 1 tablet under the tongue daily.  Marland Kitchen doxazosin (CARDURA) 8 MG tablet TAKE 1/2 TO 1 TABLET BY MOUTH  DAILY  . finasteride (PROSCAR) 5 MG tablet Take 1 tablet by mouth daily.  . fluticasone (FLONASE) 50 MCG/ACT nasal spray 1 spray in each nostril qd  . guaiFENesin (MUCINEX) 600 MG 12 hr tablet Take 600 mg by mouth as needed. Reported on 05/25/2015  . latanoprost (XALATAN) 0.005 % ophthalmic solution Place 1 drop into the left eye at bedtime.  Marland Kitchen Respiratory Therapy Supplies (FLUTTER) DEVI 1 Device by Other route as needed.  . sodium chloride HYPERTONIC 3 % nebulizer solution Take by nebulization 2 (two) times daily.  Marland Kitchen topiramate (TOPAMAX) 25 MG tablet Take 1 tablet (25 mg total) by mouth at bedtime.  . [DISCONTINUED] doxycycline (VIBRA-TABS) 100 MG tablet Take 1 tablet (100 mg total) by mouth 2 (two) times daily.   No facility-administered encounter medications on file as of 05/05/2018.     Activities of Daily Living In your present state of health, do you have any difficulty performing the following activities: 05/05/2018  Hearing? Y  Comment Wears hearing aids bilaterally  Vision? N  Difficulty concentrating or making decisions? N  Walking or climbing stairs? N  Dressing or bathing? N  Doing errands, shopping? N  Preparing Food and eating ? N  Using the Toilet? N  In the past six months, have you accidently leaked urine? Y  Comment Occassional urine leaking  Do you have problems with loss of bowel control? N  Managing your Medications? N  Managing your Finances? N  Housekeeping or managing your Housekeeping? N  Some recent data might be hidden    Patient Care Team: Chipper Herb, MD as PCP - General (Family Medicine) Irine Seal, MD (Urology) Richmond Campbell, MD (Gastroenterology) Clent Jacks, MD (Ophthalmology) Gaynelle Arabian, MD as Consulting  Physician (Orthopedic Surgery) Jodi Marble, MD as Consulting Physician (Otolaryngology) Juanito Doom, MD as Consulting Physician (Pulmonary Disease) Kathrynn Ducking, MD as Consulting Physician (Neurology)   Assessment:   This is a routine wellness examination for [employer]  Exercise Activities and Dietary recommendations  Mr. Odenthal states he eats 3 meals per day.  Usually Pancakes or eggs for breakfast, ham and cheese sandwich for lunch, and meat, green beans, and potatoes for supper.  Recommended diet of mostly non-starchy vegetables, fruits, lean proteins, and whole grains.  Patient states he has access and resources to get all the food he needs.   Current Exercise Habits: The patient does not participate in regular exercise at present, Exercise limited by: respiratory conditions(s)  Goals    . Exercise 150 min/wk Moderate Activity (pt-stated)     Walk to daughters house and back (one block away) twice daily- everyday.     . Exercise 150 minutes per week (moderate activity)     Consider "Silver Sneakers" program       Fall Risk Fall Risk  05/05/2018 03/28/2018 11/21/2017 10/30/2017 07/19/2017  Falls in the past year? 0 0 No Yes Yes  Comment - - - - -  Number falls in past yr: - - - 1 1  Injury with Fall? - - - No No  Follow up - - - Falls prevention discussed -   Is the patient's home free of loose throw rugs in walkways, pet beds, electrical cords, etc?   yes      Grab bars in the bathroom? yes      Handrails on the stairs?   yes      Adequate lighting?   yes   Depression Screen PHQ 2/9 Scores 05/05/2018 03/28/2018 11/21/2017  07/19/2017  PHQ - 2 Score 0 0 0 0  Exception Documentation - - - -    Cognitive Function MMSE - Mini Mental State Exam 05/05/2018 03/19/2017 12/02/2014  Orientation to time 5 5 5   Orientation to Place 5 5 5   Registration 3 3 3   Attention/ Calculation 4 4 5   Recall 3 3 3   Language- name 2 objects 2 2 2   Language- repeat 1 1 1   Language- follow 3  step command 3 3 3   Language- read & follow direction 1 1 1   Write a sentence 1 1 1   Copy design 1 1 1   Total score 29 29 30         Immunization History  Administered Date(s) Administered  . Influenza, High Dose Seasonal PF 03/13/2016, 03/05/2017, 03/07/2018  . Influenza,inj,Quad PF,6+ Mos 03/20/2013, 03/08/2014, 03/15/2015  . Pneumococcal Conjugate-13 12/08/2013  . Pneumococcal Polysaccharide-23 05/28/1997  . Td 05/29/2007  . Tdap 11/05/2016  . Zoster 05/07/2006    Qualifies for Shingles Vaccine? Yes, 1st dose given today  Screening Tests Health Maintenance  Topic Date Due  . TETANUS/TDAP  11/06/2026  . INFLUENZA VACCINE  Completed  . PNA vac Low Risk Adult  Completed   Cancer Screenings: Lung: Low Dose CT Chest recommended if Age 21-80 years, 30 pack-year currently smoking OR have quit w/in 15years. Patient does not qualify. Colorectal: Up to date, no further screening colonoscopies indicated due to age  Additional Screenings:  Hepatitis C Screening:  Not indicated        Plan:     Work on your goal of increasing your walking- walk to daughters house up the street and back twice per day.  Bring a copy of your Healthcare Power of Attorney and Living Will to our office to be filed in your medical record.  You will need the 2nd dose after 07/06/2018.    I have personally reviewed and noted the following in the patient's chart:   . Medical and social history . Use of alcohol, tobacco or illicit drugs  . Current medications and supplements . Functional ability and status . Nutritional status . Physical activity . Advanced directives . List of other physicians . Hospitalizations, surgeries, and ER visits in previous 12 months . Vitals . Screenings to include cognitive, depression, and falls . Referrals and appointments  In addition, I have reviewed and discussed with patient certain preventive protocols, quality metrics, and best practice recommendations. A  written personalized care plan for preventive services as well as general preventive health recommendations were provided to patient.     Denyce Robert, RN  05/05/2018

## 2018-05-05 NOTE — Patient Instructions (Addendum)
Please work on your goal of increasing your walking- walk to daughters house up the street and back twice per day.   At your convenience, please bring a copy of your Healthcare Power of Attorney and Living Will to our office to be filed in your medical record.   You received your 1st Shingrix (shingles) vaccine today.  You will need the 2nd dose after 07/06/2018.   Thank you for coming in for your Annual Wellness Visit today!!  Preventive Care 82 Years and Older, Male Preventive care refers to lifestyle choices and visits with your health care provider that can promote health and wellness. What does preventive care include?  A yearly physical exam. This is also called an annual well check.  Dental exams once or twice a year.  Routine eye exams. Ask your health care provider how often you should have your eyes checked.  Personal lifestyle choices, including: ? Daily care of your teeth and gums. ? Regular physical activity. ? Eating a healthy diet. ? Avoiding tobacco and drug use. ? Limiting alcohol use. ? Practicing safe sex. ? Taking low doses of aspirin every day. ? Taking vitamin and mineral supplements as recommended by your health care provider. What happens during an annual well check? The services and screenings done by your health care provider during your annual well check will depend on your age, overall health, lifestyle risk factors, and family history of disease. Counseling Your health care provider may ask you questions about your:  Alcohol use.  Tobacco use.  Drug use.  Emotional well-being.  Home and relationship well-being.  Sexual activity.  Eating habits.  History of falls.  Memory and ability to understand (cognition).  Work and work Statistician.  Screening You may have the following tests or measurements:  Height, weight, and BMI.  Blood pressure.  Lipid and cholesterol levels. These may be checked every 5 years, or more frequently if you  are over 57 years old.  Skin check.  Lung cancer screening. You may have this screening every year starting at age 33 if you have a 30-pack-year history of smoking and currently smoke or have quit within the past 15 years.  Fecal occult blood test (FOBT) of the stool. You may have this test every year starting at age 17.  Flexible sigmoidoscopy or colonoscopy. You may have a sigmoidoscopy every 5 years or a colonoscopy every 10 years starting at age 17.  Prostate cancer screening. Recommendations will vary depending on your family history and other risks.  Hepatitis C blood test.  Hepatitis B blood test.  Sexually transmitted disease (STD) testing.  Diabetes screening. This is done by checking your blood sugar (glucose) after you have not eaten for a while (fasting). You may have this done every 1-3 years.  Abdominal aortic aneurysm (AAA) screening. You may need this if you are a current or former smoker.  Osteoporosis. You may be screened starting at age 28 if you are at high risk.  Talk with your health care provider about your test results, treatment options, and if necessary, the need for more tests. Vaccines Your health care provider may recommend certain vaccines, such as:  Influenza vaccine. This is recommended every year.  Tetanus, diphtheria, and acellular pertussis (Tdap, Td) vaccine. You may need a Td booster every 10 years.  Varicella vaccine. You may need this if you have not been vaccinated.  Zoster vaccine. You may need this after age 26.  Measles, mumps, and rubella (MMR) vaccine. You may  need at least one dose of MMR if you were born in 1957 or later. You may also need a second dose.  Pneumococcal 13-valent conjugate (PCV13) vaccine. One dose is recommended after age 72.  Pneumococcal polysaccharide (PPSV23) vaccine. One dose is recommended after age 28.  Meningococcal vaccine. You may need this if you have certain conditions.  Hepatitis A vaccine. You may  need this if you have certain conditions or if you travel or work in places where you may be exposed to hepatitis A.  Hepatitis B vaccine. You may need this if you have certain conditions or if you travel or work in places where you may be exposed to hepatitis B.  Haemophilus influenzae type b (Hib) vaccine. You may need this if you have certain risk factors.  Talk to your health care provider about which screenings and vaccines you need and how often you need them. This information is not intended to replace advice given to you by your health care provider. Make sure you discuss any questions you have with your health care provider. Document Released: 06/10/2015 Document Revised: 02/01/2016 Document Reviewed: 03/15/2015 Elsevier Interactive Patient Education  [employer].

## 2018-05-13 DIAGNOSIS — H40051 Ocular hypertension, right eye: Secondary | ICD-10-CM | POA: Diagnosis not present

## 2018-05-13 DIAGNOSIS — H401122 Primary open-angle glaucoma, left eye, moderate stage: Secondary | ICD-10-CM | POA: Diagnosis not present

## 2018-05-16 ENCOUNTER — Ambulatory Visit: Payer: Medicare Other | Admitting: Pulmonary Disease

## 2018-06-03 ENCOUNTER — Ambulatory Visit (INDEPENDENT_AMBULATORY_CARE_PROVIDER_SITE_OTHER): Payer: Medicare Other | Admitting: Pulmonary Disease

## 2018-06-03 ENCOUNTER — Encounter: Payer: Self-pay | Admitting: Pulmonary Disease

## 2018-06-03 VITALS — BP 114/70 | HR 73 | Ht 70.5 in | Wt 175.0 lb

## 2018-06-03 DIAGNOSIS — J479 Bronchiectasis, uncomplicated: Secondary | ICD-10-CM | POA: Diagnosis not present

## 2018-06-03 DIAGNOSIS — R5383 Other fatigue: Secondary | ICD-10-CM | POA: Diagnosis not present

## 2018-06-03 NOTE — Progress Notes (Signed)
Subjective:   PATIENT ID: Charles Golden GENDER: male DOB: 12/06/1925, MRN: [id number]  Synopsis: Former patient of Dr. Ashok Cordia with bronchiectasis and GERD.  He had pneumonia that hospitalized him as a child on VJ Day 1945   HPI  Chief Complaint  Patient presents with  . Follow-up    pt states he is doing well, c/o baseline prod cough with lots of yellow mucus.     He still has a lot of fatigue.  He notes headache quite a bit as well.  He says he has them about 2 times per week.  He has ben seeing Dr. Jannifer Franklin for this.  His predominant problem is fatigue and some dyspnea.  Fatigue is worse than dyspnea.  He still produces mucus about three times a day.    Past Medical History:  Diagnosis Date  . Bronchitis, chronic ([place])   . COPD (chronic obstructive pulmonary disease) ([place])   . Enlarged prostate   . GERD (gastroesophageal reflux disease)   . Glucagonoma   . Hernia, incisional    at present  . HOH (hard of hearing)   . IBS (irritable bowel syndrome)   . Sciatic pain   . Sinus congestion      Review of Systems  Constitutional: Negative for chills and malaise/fatigue.  HENT: Negative for congestion and nosebleeds.   Respiratory: Positive for cough and sputum production. Negative for shortness of breath, wheezing and stridor.   Cardiovascular: Negative for chest pain, claudication and leg swelling.  Neurological: Negative for weakness.      Objective:  Physical Exam   Vitals:   06/03/18 1155  BP: 114/70  Pulse: 73  SpO2: 98%  Weight: 175 lb (79.4 kg)  Height: 5' 10.5" (1.791 m)    Gen: well appearing HENT: OP clear, TM's clear, neck supple PULM: CTA B, normal percussion CV: RRR, no mgr, trace edema GI: BS+, soft, nontender Derm: no cyanosis or rash Psyche: normal mood and affect    CBC    Component Value Date/Time   WBC 6.0 03/28/2018 1129   WBC 7.5 08/23/2014 0937   WBC 8.2 07/16/2014 1400   RBC 4.50 03/28/2018 1129   RBC 4.68 (A) 08/23/2014  0937   RBC 4.50 07/16/2014 1400   HGB 14.0 03/28/2018 1129   HCT 41.8 03/28/2018 1129   PLT 248 03/28/2018 1129   MCV 93 03/28/2018 1129   MCH 31.1 03/28/2018 1129   MCH 28.4 08/23/2014 0937   MCH 30.4 07/16/2014 1400   MCHC 33.5 03/28/2018 1129   MCHC 30.9 (A) 08/23/2014 0937   MCHC 31.9 07/16/2014 1400   RDW 12.6 03/28/2018 1129   LYMPHSABS 1.8 03/28/2018 1129   MONOABS 0.6 09/09/2013 1031   EOSABS 0.2 03/28/2018 1129   BASOSABS 0.0 03/28/2018 1129     PFT 12/20/15: FVC 3.41 L (91%) FEV1 2.21 L (85%) FEV1/FVC 0.65 FEF 25-75 1.09 L (68%) negative bronchodilator response 07/13/15: FVC 3.63 L (96%) FEV1 2.40 L (92%) FEV1/FVC 0.66 FEF 25-75 1.17 L (71%) positive bronchodilator response TLC 8.07 L (112%) RV 143% ERV 106% DLCO corrected 75%  06/22/13: FVC 3.74 L (89%) FEV1 2.39 L (77%) FEV1/FVC 0.64 FEF 25-75 1.17 L (46%)  6MWT 12/21/15:  Walked 360 meters / Baseline Sat 99% on RA / Nadir Sat 97% 07/13/15:  Walked 360 meters / Baseline Sat 97% on RA / Nadir Sat 97% on RA   POLYSOMNOGRAM (11/15/16):  PLMS index of 49.47. Supine AHI 12.5 events per hour. Lowest  saturation 88%. Overall AHI 3.6 events/hr.   IMAGING CT CHEST W/O 04/19/15: images reviewed: mild cylindrical bronchiectasis in bases, calcified mediastinal nodes   MICROBIOLOGY September 2019 sputum culture AFB, fungal, bacterial: Unremarkable, Candida glabrata 03/2017 Sputum culture > neg Sputum AFB Ctx (11/14/16):  Negative Sputum Ctx (06/01/16):  Oral Flora / Fungus negative / AFB negative  Sputum Ctx (04/13/16):  Oral Flora  Sputum Ctx (11/09/15):  Oral Flora / Fungus negative / AFB negative Sputum Ctx (04/19/15): Oral Flora/AFB negative/Fungal negative  LABS 05/30/16 CBC:  8.9/12.3/38.5/267  04/11/15 Alpha-1 antitrypsin: MM (153) IgM: 31 IgA: 195 IgG: 822        Assessment & Plan:   Bronchiectasis without complication (HCC)  Fatigue, unspecified type  Discussion: From a bronchiectasis standpoint this  is been a stable interval for [place], he did have one exacerbation which is not unexpected.  I do not have a clear explanation for his fatigue other than the fact that he is 39 and has a chronic lung disease.  I do not think that the bronchiectasis is suddenly worsened.  He does not have evidence of an underlying severe chronic infection based on the sputum culture we did previously.  Unfortunately I am still waiting for the results of the overnight oximetry test.  I think that he may benefit from drinking caffeinated coffee.  Plan: Bronchiectasis: Make sure you clear out mucus twice a day: Hypertonic saline and albuterol nebulized twice a day and using the flutter valve is helpful Practice good hand hygiene Stay active Call me if you have bronchitis or worsening chest symptoms  We will see you back in 6 months or sooner if needed     Current Outpatient Medications:  .  albuterol (PROVENTIL) (2.5 MG/3ML) 0.083% nebulizer solution, Take 3 mLs (2.5 mg total) by nebulization every 6 (six) hours as needed for wheezing or shortness of breath., Disp: 75 mL, Rfl: 3 .  azelastine (ASTELIN) 0.1 % nasal spray, PLACE 1 SPRAY INTO THE NOSE 1 DAY OR 1 DOSE. USE IN EACH NOSTRIL AS DIRECTED, Disp: 30 mL, Rfl: 11 .  Cholecalciferol (VITAMIN D3) 2000 UNITS TABS, Take 1 tablet by mouth daily., Disp: , Rfl:  .  Cyanocobalamin (VITAMIN B-12) 5000 MCG SUBL, Place 1 tablet under the tongue daily., Disp: , Rfl:  .  doxazosin (CARDURA) 8 MG tablet, TAKE 1/2 TO 1 TABLET BY MOUTH DAILY, Disp: 30 tablet, Rfl: 4 .  finasteride (PROSCAR) 5 MG tablet, Take 1 tablet by mouth daily., Disp: , Rfl: 10 .  fluticasone (FLONASE) 50 MCG/ACT nasal spray, 1 spray in each nostril qd, Disp: 16 g, Rfl: 11 .  guaiFENesin (MUCINEX) 600 MG 12 hr tablet, Take 600 mg by mouth as needed. Reported on 05/25/2015, Disp: , Rfl:  .  latanoprost (XALATAN) 0.005 % ophthalmic solution, Place 1 drop into the left eye at bedtime., Disp: 2.5 mL, Rfl:  1 .  Respiratory Therapy Supplies (FLUTTER) DEVI, 1 Device by Other route as needed., Disp: 1 each, Rfl: 0 .  sodium chloride HYPERTONIC 3 % nebulizer solution, Take by nebulization 2 (two) times daily., Disp: 750 mL, Rfl: 3 .  topiramate (TOPAMAX) 25 MG tablet, Take 1 tablet (25 mg total) by mouth at bedtime., Disp: 30 tablet, Rfl: 3

## 2018-06-03 NOTE — Patient Instructions (Signed)
Bronchiectasis: Make sure you clear out mucus twice a day: Hypertonic saline and albuterol nebulized twice a day and using the flutter valve is helpful Practice good hand hygiene Stay active Call me if you have bronchitis or worsening chest symptoms  We will see you back in 6 months or sooner if needed

## 2018-06-13 ENCOUNTER — Other Ambulatory Visit: Payer: Self-pay | Admitting: Internal Medicine

## 2018-06-13 ENCOUNTER — Ambulatory Visit
Admission: RE | Admit: 2018-06-13 | Discharge: 2018-06-13 | Disposition: A | Discharge status: Home / Self Care | Payer: Medicare Other | Source: Ambulatory Visit | Attending: Internal Medicine | Admitting: Internal Medicine

## 2018-06-13 DIAGNOSIS — R197 Diarrhea, unspecified: Secondary | ICD-10-CM

## 2018-06-13 DIAGNOSIS — K5900 Constipation, unspecified: Secondary | ICD-10-CM

## 2018-06-13 DIAGNOSIS — R5381 Other malaise: Secondary | ICD-10-CM | POA: Diagnosis not present

## 2018-06-18 DIAGNOSIS — K52831 Collagenous colitis: Secondary | ICD-10-CM | POA: Diagnosis not present

## 2018-06-18 DIAGNOSIS — K219 Gastro-esophageal reflux disease without esophagitis: Secondary | ICD-10-CM | POA: Diagnosis not present

## 2018-06-18 DIAGNOSIS — K439 Ventral hernia without obstruction or gangrene: Secondary | ICD-10-CM | POA: Diagnosis not present

## 2018-07-17 ENCOUNTER — Ambulatory Visit (INDEPENDENT_AMBULATORY_CARE_PROVIDER_SITE_OTHER): Payer: Medicare Other | Admitting: *Deleted

## 2018-07-17 DIAGNOSIS — Z23 Encounter for immunization: Secondary | ICD-10-CM

## 2018-07-17 NOTE — Progress Notes (Signed)
Pt given 2nd Shingrix vaccine Tolerated well 

## 2018-07-24 ENCOUNTER — Other Ambulatory Visit: Payer: Self-pay | Admitting: Family Medicine

## 2018-08-05 ENCOUNTER — Ambulatory Visit (INDEPENDENT_AMBULATORY_CARE_PROVIDER_SITE_OTHER): Payer: Medicare Other | Admitting: Family Medicine

## 2018-08-05 ENCOUNTER — Encounter: Payer: Self-pay | Admitting: Family Medicine

## 2018-08-05 VITALS — BP 109/64 | HR 73 | Temp 97.7°F | Ht 70.5 in | Wt 169.0 lb

## 2018-08-05 DIAGNOSIS — R51 Headache: Secondary | ICD-10-CM

## 2018-08-05 DIAGNOSIS — G8929 Other chronic pain: Secondary | ICD-10-CM

## 2018-08-05 DIAGNOSIS — I739 Peripheral vascular disease, unspecified: Secondary | ICD-10-CM

## 2018-08-05 DIAGNOSIS — J449 Chronic obstructive pulmonary disease, unspecified: Secondary | ICD-10-CM | POA: Diagnosis not present

## 2018-08-05 DIAGNOSIS — K219 Gastro-esophageal reflux disease without esophagitis: Secondary | ICD-10-CM | POA: Diagnosis not present

## 2018-08-05 DIAGNOSIS — R531 Weakness: Secondary | ICD-10-CM | POA: Diagnosis not present

## 2018-08-05 DIAGNOSIS — I700 Atherosclerosis of aorta: Secondary | ICD-10-CM

## 2018-08-05 DIAGNOSIS — K4090 Unilateral inguinal hernia, without obstruction or gangrene, not specified as recurrent: Secondary | ICD-10-CM

## 2018-08-05 DIAGNOSIS — J479 Bronchiectasis, uncomplicated: Secondary | ICD-10-CM

## 2018-08-05 DIAGNOSIS — E559 Vitamin D deficiency, unspecified: Secondary | ICD-10-CM | POA: Diagnosis not present

## 2018-08-05 NOTE — Patient Instructions (Addendum)
Medicare Annual Wellness Visit  [facility] and the medical providers at [facility] strive to bring you the best medical care.  In doing so we not only want to address your current medical conditions and concerns but also to detect new conditions early and prevent illness, disease and health-related problems.    Medicare offers a yearly Wellness Visit which allows our clinical staff to assess your need for preventative services including immunizations, lifestyle education, counseling to decrease risk of preventable diseases and screening for fall risk and other medical concerns.    This visit is provided free of charge (no copay) for all Medicare recipients. The clinical pharmacists at [facility] have begun to conduct these Wellness Visits which will also include a thorough review of all your medications.    As you primary medical provider recommend that you make an appointment for your Annual Wellness Visit if you have not done so already this year.  You may set up this appointment before you leave today or you may call back ([phone]) and schedule an appointment.  Please make sure when you call that you mention that you are scheduling your Annual Wellness Visit with the clinical pharmacist so that the appointment may be made for the proper length of time.     Continue current medications. Continue good therapeutic lifestyle changes which include good diet and exercise. Fall precautions discussed with patient. If an FOBT was given today- please return it to our front desk. If you are over 48 years old - you may need Prevnar 15 or the adult Pneumonia vaccine.  **Flu shots are available--- please call and schedule a FLU-CLINIC appointment**  After your visit with [place] today you will receive a survey in the mail or online from [employer] regarding your care with [place]. Please take a moment to fill this out. Your feedback is very  important to [place] as you can help [place] better understand your patient needs as well as improve your experience and satisfaction. WE CARE ABOUT YOU!!!   Please call Dr. Arnoldo Morale and have him look at your right inguinal hernia since you are having increasing problems with discomfort with this. Continue to drink plenty of water and fluids Try to walk regularly as much as possible as this will be good for your back Take Tylenol if needed for back pain Follow-up with pulmonology and gastroenterology as needed Follow-up with urology as planned

## 2018-08-05 NOTE — Progress Notes (Signed)
Subjective:    Patient ID: Charles Golden, male    DOB: 1925-06-04, 85 y.o.   MRN: [id number]  HPI Pt here for follow up and management of chronic medical problems which includes COPD. He is taking medication regularly.  The patient is doing well overall.  He continues to have low back pain which is getting better and complains with ongoing fatigue.  He will have lab work done today and was given an FOBT to return.  His rectal exams are done by the urologist.  His weight is down 6 pounds from the previous visit and the remainder of his vital signs are stable.  He is 85 years old.  He certainly does not seem to be that all to me.  This patient sees multiple specialist.  He has a history of headache irritable bowel syndrome COPD thoracic aortic atherosclerosis and upper airway cough syndrome.  He is using inhalers taking B12 and using nebulizer treatments.  He sees Dr. Lake Bells, Dr. Jannifer Franklin, Dr. Earlean Shawl, Dr. Erik Obey, and Dr. Jeffie Pollock.  The patient is pleasant and fairly relaxed today and seems to be stable with all of his problems and issues.  He denies any chest pain.  He says he has no more shortness of breath than usual and is followed regularly by Dr. Lake Bells.  He has problems with loose stools and he has worked out an arrangement with his gastroenterologist to use budesonide for this and this helps considerably.  The loose stools seem to be under good control.  He denies any problems with blood in the stool or black tarry bowel movements or problems with swallowing.  He is passing his water well and sees Dr. Jeffie Pollock the urologist regularly for this.  He is concerned somewhat about his right inguinal hernia which seems to be giving him more trouble and I suggested that he go back and see Dr. Arnoldo Morale who is familiar with him so that he is aware of the degree of problem he is currently having with the right inguinal hernia and he will do that.  The headaches are doing better as long as he takes Excedrin Migraine and  drinks his caffeine.     Patient Active Problem List   Diagnosis Date Noted  . Restless leg syndrome 11/25/2016  . Thoracic aortic atherosclerosis ([place]) 05/03/2015  . Upper airway cough syndrome 04/28/2015  . GERD (gastroesophageal reflux disease) 04/11/2015  . Tinea corporis 02/15/2015  . Acute medial meniscal tear 07/20/2014  . COPD GOLD I  12/08/2013  . Small bowel volvulus ([place]) 08/05/2013  . Chronic headache 08/05/2013  . Left inguinal hernia 08/05/2013  . Incisional hernia, without obstruction or gangrene 08/05/2013  . Bronchiectasis with (acute) exacerbation ([place]) 06/22/2013  . Headache 05/01/2013  . Obstructive chronic bronchitis without exacerbation ([place]) 04/30/2013  . Left hip pain 04/29/2013  . BPH (benign prostatic hyperplasia) 01/05/2013  . IBS (irritable bowel syndrome) 09/03/2012  . Chronic rhinosinusitis 09/03/2012  . Lung nodules 05/04/2003   Outpatient Encounter Medications as of 08/05/2018  Medication Sig  . albuterol (PROVENTIL) (2.5 MG/3ML) 0.083% nebulizer solution Take 3 mLs (2.5 mg total) by nebulization every 6 (six) hours as needed for wheezing or shortness of breath.  . Azelastine HCl 137 MCG/SPRAY SOLN PLACE 1 SPRAY INTO THE NOSE 1 DAY OR 1 DOSE. USE IN EACH NOSTRIL AS DIRECTED  . budesonide (ENTOCORT EC) 3 MG 24 hr capsule Take by mouth.  . Cholecalciferol (VITAMIN D3) 2000 UNITS TABS Take 1 tablet by mouth daily.  Marland Kitchen  Cyanocobalamin (VITAMIN B-12) 5000 MCG SUBL Place 1 tablet under the tongue daily.  Marland Kitchen doxazosin (CARDURA) 8 MG tablet TAKE 1/2 TO 1 TABLET BY MOUTH DAILY  . finasteride (PROSCAR) 5 MG tablet Take 1 tablet by mouth daily.  . fluticasone (FLONASE) 50 MCG/ACT nasal spray 1 SPRAY IN EACH NOSTRIL ONCE A DAY  . guaiFENesin (MUCINEX) 600 MG 12 hr tablet Take 600 mg by mouth as needed. Reported on 05/25/2015  . latanoprost (XALATAN) 0.005 % ophthalmic solution Place 1 drop into the left eye at bedtime.  Marland Kitchen Respiratory Therapy Supplies (FLUTTER)  DEVI 1 Device by Other route as needed.  . sodium chloride HYPERTONIC 3 % nebulizer solution Take by nebulization 2 (two) times daily.  . [DISCONTINUED] topiramate (TOPAMAX) 25 MG tablet Take 1 tablet (25 mg total) by mouth at bedtime.   No facility-administered encounter medications on file as of 08/05/2018.      Review of Systems  Constitutional: Positive for fatigue.  HENT: Negative.   Eyes: Negative.   Respiratory: Negative.   Cardiovascular: Negative.   Gastrointestinal: Negative.   Endocrine: Negative.   Genitourinary: Negative.   Musculoskeletal: Positive for back pain (low - at times -getting some better ).  Skin: Negative.   Allergic/Immunologic: Negative.   Neurological: Negative.   Hematological: Negative.   Psychiatric/Behavioral: Negative.        Objective:   Physical Exam Vitals signs and nursing note reviewed.  Constitutional:      General: He is not in acute distress.    Appearance: Normal appearance. He is well-developed and normal weight. He is not ill-appearing.     Comments: Patient is pleasant and doing well for his soon-to-be age of 85 years.  HENT:     Head: Normocephalic and atraumatic.     Right Ear: Tympanic membrane, ear canal and external ear normal. There is no impacted cerumen.     Left Ear: Tympanic membrane, ear canal and external ear normal. There is no impacted cerumen.     Nose: Nose normal. No congestion.     Mouth/Throat:     Mouth: Mucous membranes are dry.     Pharynx: Oropharynx is clear. No oropharyngeal exudate or posterior oropharyngeal erythema.  Eyes:     General: No scleral icterus.       Right eye: No discharge.        Left eye: No discharge.     Extraocular Movements: Extraocular movements intact.     Conjunctiva/sclera: Conjunctivae normal.     Pupils: Pupils are equal, round, and reactive to light.  Neck:     Musculoskeletal: Normal range of motion and neck supple.     Thyroid: No thyromegaly.     Vascular: No carotid  bruit.     Trachea: No tracheal deviation.     Comments: No bruits thyromegaly or anterior cervical adenopathy Cardiovascular:     Rate and Rhythm: Normal rate. Rhythm irregular.     Heart sounds: Normal heart sounds. No murmur.     Comments: The heart is slightly irregular at 72/min pulses and lower extremity and feet were diminished.  He denies having claudication type pain.  He says that his problems with walking or generalized weakness and not pain in the leg weakness. Pulmonary:     Effort: Pulmonary effort is normal. No respiratory distress.     Breath sounds: Normal breath sounds. No wheezing or rales.     Comments: Breath sounds were good today for him with no wheezes or rhonchi.  No axillary adenopathy Abdominal:     General: Bowel sounds are normal. There is no distension.     Palpations: Abdomen is soft. There is no mass.     Tenderness: There is no abdominal tenderness. There is no guarding.     Hernia: A hernia is present.     Comments: Patient has a midline incisional hernia and a large right inguinal hernia.  The right inguinal hernia is reducible.  This is the one that is giving him more trouble now.  No liver or spleen enlargement no masses with good inguinal pulses.  Musculoskeletal: Normal range of motion.        General: No tenderness.     Right lower leg: No edema.     Left lower leg: No edema.  Lymphadenopathy:     Cervical: No cervical adenopathy.  Skin:    General: Skin is warm and dry.     Findings: No rash.  Neurological:     General: No focal deficit present.     Mental Status: He is alert and oriented to person, place, and time. Mental status is at baseline.     Cranial Nerves: No cranial nerve deficit.     Gait: Gait normal.     Deep Tendon Reflexes: Reflexes are normal and symmetric. Reflexes normal.  Psychiatric:        Mood and Affect: Mood normal.        Behavior: Behavior normal.        Thought Content: Thought content normal.        Judgment:  Judgment normal.     Comments: Mood affect and behavior for this patient is normal.    BP 109/64 (BP Location: Left Arm)   Pulse 73   Temp 97.7 F (36.5 C) (Oral)   Ht 5' 10.5" (1.791 m)   Wt 169 lb (76.7 kg)   BMI 23.91 kg/m         Assessment & Plan:  1. Thoracic aortic atherosclerosis ([place]) -Continue with as aggressive therapeutic lifestyle changes as possible including diet and exercise to control cholesterol. - CBC with Differential/Platelet - Lipid panel  2. Gastroesophageal reflux disease, esophagitis presence not specified -Continue to watch diet closely and avoid fried foods and spicy foods as much as possible - CBC with Differential/Platelet - Hepatic function panel  3. Vitamin D deficiency -Continue with vitamin D replacement pending results of lab work - CBC with Differential/Platelet - VITAMIN D 25 Hydroxy (Vit-D Deficiency, Fractures)  4. COPD GOLD I  -Continue follow-up with Dr. Lake Bells and continue with inhalers and nebulizer treatments. - BMP8+EGFR - CBC with Differential/Platelet - Hepatic function panel  5. Bronchiectasis without complication ([place]) -Continue with follow-up with Dr. Lake Bells and inhalers and Mucinex.  6. Obstructive chronic bronchitis without exacerbation ([place]) -Continue with current treatment  7. Right inguinal hernia -Follow-up with Dr. Arnoldo Morale.  Patient says he will call if he cannot get an appointment he will call us and we will get the appointment.  8. Chronic nonintractable headache, unspecified headache type -Follow-up with neurology as planned  9. Weakness -Review labs  10. Peripheral vascular insufficiency ([facility]) -Walk regularly and is much as possible and may need arterial Dopplers  Patient Instructions                       Medicare Annual Wellness Visit  [facility] and the medical providers at [facility] strive to bring you the best medical care.  In  doing so we not only want to address  your current medical conditions and concerns but also to detect new conditions early and prevent illness, disease and health-related problems.    Medicare offers a yearly Wellness Visit which allows our clinical staff to assess your need for preventative services including immunizations, lifestyle education, counseling to decrease risk of preventable diseases and screening for fall risk and other medical concerns.    This visit is provided free of charge (no copay) for all Medicare recipients. The clinical pharmacists at [facility] have begun to conduct these Wellness Visits which will also include a thorough review of all your medications.    As you primary medical provider recommend that you make an appointment for your Annual Wellness Visit if you have not done so already this year.  You may set up this appointment before you leave today or you may call back ([phone]) and schedule an appointment.  Please make sure when you call that you mention that you are scheduling your Annual Wellness Visit with the clinical pharmacist so that the appointment may be made for the proper length of time.     Continue current medications. Continue good therapeutic lifestyle changes which include good diet and exercise. Fall precautions discussed with patient. If an FOBT was given today- please return it to our front desk. If you are over 4 years old - you may need Prevnar 36 or the adult Pneumonia vaccine.  **Flu shots are available--- please call and schedule a FLU-CLINIC appointment**  After your visit with [place] today you will receive a survey in the mail or online from [employer] regarding your care with [place]. Please take a moment to fill this out. Your feedback is very important to [place] as you can help [place] better understand your patient needs as well as improve your experience and satisfaction. WE CARE ABOUT YOU!!!   Please call Dr. Arnoldo Morale and have him look at your right inguinal hernia  since you are having increasing problems with discomfort with this. Continue to drink plenty of water and fluids Try to walk regularly as much as possible as this will be good for your back Take Tylenol if needed for back pain Follow-up with pulmonology and gastroenterology as needed Follow-up with urology as planned  Arrie Senate MD

## 2018-08-06 LAB — CBC WITH DIFFERENTIAL/PLATELET
Basophils Absolute: 0 10*3/uL (ref 0.0–0.2)
Basos: 1 %
EOS (ABSOLUTE): 0.1 10*3/uL (ref 0.0–0.4)
EOS: 2 %
Hematocrit: 37.9 % (ref 37.5–51.0)
Hemoglobin: 13.2 g/dL (ref 13.0–17.7)
Immature Grans (Abs): 0 10*3/uL (ref 0.0–0.1)
Immature Granulocytes: 0 %
Lymphocytes Absolute: 1.6 10*3/uL (ref 0.7–3.1)
Lymphs: 25 %
MCH: 31 pg (ref 26.6–33.0)
MCHC: 34.8 g/dL (ref 31.5–35.7)
MCV: 89 fL (ref 79–97)
MONOS ABS: 0.5 10*3/uL (ref 0.1–0.9)
Monocytes: 8 %
Neutrophils Absolute: 4.2 10*3/uL (ref 1.4–7.0)
Neutrophils: 64 %
Platelets: 261 10*3/uL (ref 150–450)
RBC: 4.26 x10E6/uL (ref 4.14–5.80)
RDW: 12.6 % (ref 11.6–15.4)
WBC: 6.6 10*3/uL (ref 3.4–10.8)

## 2018-08-06 LAB — LIPID PANEL
CHOL/HDL RATIO: 2.6 ratio (ref 0.0–5.0)
Cholesterol, Total: 145 mg/dL (ref 100–199)
HDL: 55 mg/dL (ref >39)
LDL Calculated: 74 mg/dL (ref 0–99)
Triglycerides: 79 mg/dL (ref 0–149)
VLDL Cholesterol Cal: 16 mg/dL (ref 5–40)

## 2018-08-06 LAB — HEPATIC FUNCTION PANEL
ALT: 11 IU/L (ref 0–44)
AST: 17 IU/L (ref 0–40)
Albumin: 3.8 g/dL (ref 3.5–4.6)
Alkaline Phosphatase: 71 IU/L (ref 39–117)
Bilirubin Total: 0.3 mg/dL (ref 0.0–1.2)
Bilirubin, Direct: 0.14 mg/dL (ref 0.00–0.40)
Total Protein: 5.9 g/dL — ABNORMAL LOW (ref 6.0–8.5)

## 2018-08-06 LAB — BMP8+EGFR
BUN/Creatinine Ratio: 15 (ref 10–24)
BUN: 23 mg/dL (ref 10–36)
CO2: 21 mmol/L (ref 20–29)
Calcium: 9.2 mg/dL (ref 8.6–10.2)
Chloride: 107 mmol/L — ABNORMAL HIGH (ref 96–106)
Creatinine, Ser: 1.49 mg/dL — ABNORMAL HIGH (ref 0.76–1.27)
GFR calc Af Amer: 47 mL/min/{1.73_m2} — ABNORMAL LOW (ref >59)
GFR calc non Af Amer: 41 mL/min/{1.73_m2} — ABNORMAL LOW (ref >59)
Glucose: 95 mg/dL (ref 65–99)
Potassium: 4.7 mmol/L (ref 3.5–5.2)
Sodium: 142 mmol/L (ref 134–144)

## 2018-08-06 LAB — VITAMIN D 25 HYDROXY (VIT D DEFICIENCY, FRACTURES): Vit D, 25-Hydroxy: 58 ng/mL (ref 30.0–100.0)

## 2018-08-13 ENCOUNTER — Other Ambulatory Visit: Payer: Medicare Other

## 2018-08-13 ENCOUNTER — Other Ambulatory Visit: Payer: Self-pay

## 2018-08-13 DIAGNOSIS — Z1211 Encounter for screening for malignant neoplasm of colon: Secondary | ICD-10-CM | POA: Diagnosis not present

## 2018-08-14 LAB — FECAL OCCULT BLOOD, IMMUNOCHEMICAL: Fecal Occult Bld: NEGATIVE

## 2018-09-16 ENCOUNTER — Other Ambulatory Visit: Payer: Self-pay | Admitting: *Deleted

## 2018-09-16 MED ORDER — DOXAZOSIN MESYLATE 8 MG PO TABS: 4.0000 mg | ORAL_TABLET | Freq: Every day | ORAL | 4 refills | Status: DC

## 2018-09-16 MED ORDER — FINASTERIDE 5 MG PO TABS: 5.0000 mg | ORAL_TABLET | Freq: Every day | ORAL | 0 refills | Status: DC

## 2018-09-16 NOTE — Addendum Note (Signed)
Addended by: Antonietta Barcelona D on: 09/16/2018 09:08 AM   Modules accepted: Orders

## 2018-09-16 NOTE — Addendum Note (Signed)
Addended by: Zannie Cove on: 09/16/2018 09:21 AM   Modules accepted: Orders

## 2018-10-21 ENCOUNTER — Ambulatory Visit: Payer: Medicare Other | Admitting: Neurology

## 2018-10-22 ENCOUNTER — Ambulatory Visit: Payer: Medicare Other | Admitting: Neurology

## 2018-11-05 ENCOUNTER — Encounter: Payer: Self-pay | Admitting: Family Medicine

## 2018-12-15 ENCOUNTER — Emergency Department (HOSPITAL_COMMUNITY)
Admission: EM | Admit: 2018-12-15 | Discharge: 2018-12-15 | Disposition: A | Discharge status: Home / Self Care | Payer: Medicare Other | Attending: Emergency Medicine | Admitting: Emergency Medicine

## 2018-12-15 ENCOUNTER — Encounter (HOSPITAL_COMMUNITY): Payer: Self-pay

## 2018-12-15 ENCOUNTER — Other Ambulatory Visit: Payer: Self-pay

## 2018-12-15 DIAGNOSIS — Z79899 Other long term (current) drug therapy: Secondary | ICD-10-CM | POA: Insufficient documentation

## 2018-12-15 DIAGNOSIS — K4091 Unilateral inguinal hernia, without obstruction or gangrene, recurrent: Secondary | ICD-10-CM | POA: Diagnosis not present

## 2018-12-15 DIAGNOSIS — R1030 Lower abdominal pain, unspecified: Secondary | ICD-10-CM | POA: Diagnosis not present

## 2018-12-15 DIAGNOSIS — Z87891 Personal history of nicotine dependence: Secondary | ICD-10-CM | POA: Diagnosis not present

## 2018-12-15 DIAGNOSIS — J449 Chronic obstructive pulmonary disease, unspecified: Secondary | ICD-10-CM | POA: Diagnosis not present

## 2018-12-15 DIAGNOSIS — R109 Unspecified abdominal pain: Secondary | ICD-10-CM | POA: Diagnosis present

## 2018-12-15 DIAGNOSIS — K4090 Unilateral inguinal hernia, without obstruction or gangrene, not specified as recurrent: Secondary | ICD-10-CM | POA: Diagnosis not present

## 2018-12-15 MED ORDER — ONDANSETRON HCL 4 MG/2ML IJ SOLN: 4.0000 mg | Freq: Once | INTRAMUSCULAR | Status: DC

## 2018-12-15 MED ORDER — MORPHINE SULFATE (PF) 4 MG/ML IV SOLN: 4.0000 mg | Freq: Once | INTRAVENOUS | Status: DC

## 2018-12-15 NOTE — ED Triage Notes (Signed)
Pt presents to ED with complaints of inguinal hernia pain. Pt states he had operation for hernia in 2015 and Dr Arnoldo Morale told him if he wasn't able to push it back in to come to ED. Pt states it popped out this morning and he is unable to get it back in.

## 2018-12-15 NOTE — Discharge Instructions (Addendum)
Try a hernia truss/inguinal hernia belt. When you go to the bathroom for a bowel movement, use your hand to help support/hold your hernia to prevent it from coming out more.  Follow up with Dr. Arnoldo Morale. Return to the ER for any worsening or concerning symptoms.

## 2018-12-15 NOTE — ED Notes (Signed)
EDP at bedside  

## 2018-12-15 NOTE — ED Provider Notes (Signed)
[facility] EMERGENCY DEPARTMENT Provider Note   CSN: [id number] Arrival date & time: 12/15/18  1201    History   Chief Complaint Chief Complaint  Patient presents with  . Inguinal Hernia    HPI Charles Golden is a 85 y.o. male.     85yo male presents with abdominal pain and groin pain, states right inguinal hernia will not reduce. Patient had hernia repair (unsure which side) several years ago, has a known right side hernia and his doctor has told him to go to the ER if he's unable to reduce his hernia at home. Patient tried several times at home unsuccessfully, reports abdominal pain, denies fevers, chills, nausea, vomiting, changes in bowel or bladder habits. Last bowel movement was earlier today, reports straining for a bowel movement.      Past Medical History:  Diagnosis Date  . Bronchitis, chronic ([place])   . COPD (chronic obstructive pulmonary disease) ([place])   . Enlarged prostate   . GERD (gastroesophageal reflux disease)   . Glucagonoma   . Hernia, incisional    at present  . HOH (hard of hearing)   . IBS (irritable bowel syndrome)   . Sciatic pain   . Sinus congestion     Patient Active Problem List   Diagnosis Date Noted  . Restless leg syndrome 11/25/2016  . Thoracic aortic atherosclerosis ([place]) 05/03/2015  . Upper airway cough syndrome 04/28/2015  . GERD (gastroesophageal reflux disease) 04/11/2015  . Tinea corporis 02/15/2015  . Acute medial meniscal tear 07/20/2014  . COPD GOLD I  12/08/2013  . Small bowel volvulus ([place]) 08/05/2013  . Chronic headache 08/05/2013  . Left inguinal hernia 08/05/2013  . Incisional hernia, without obstruction or gangrene 08/05/2013  . Bronchiectasis with (acute) exacerbation ([place]) 06/22/2013  . Headache 05/01/2013  . Obstructive chronic bronchitis without exacerbation ([place]) 04/30/2013  . Left hip pain 04/29/2013  . BPH (benign prostatic hyperplasia) 01/05/2013  . IBS (irritable bowel syndrome) 09/03/2012  . Chronic  rhinosinusitis 09/03/2012  . Lung nodules 05/04/2003    Past Surgical History:  Procedure Laterality Date  . BOWEL RESECTION N/A 09/04/2012   Procedure: SMALL BOWEL RESECTION;  Surgeon: Donato Heinz, MD;  Location: [facility];  Service: General;  Laterality: N/A;  Anastimosis  . HEMORRHOID SURGERY    . HERNIA REPAIR Right 70's  . INGUINAL HERNIA REPAIR Left 09/14/2013   Procedure: LEFT INGUINAL HERNIORRHAPHY;  Surgeon: Jamesetta So, MD;  Location: [facility];  Service: General;  Laterality: Left;  . INSERTION OF MESH Left 09/14/2013   Procedure: INSERTION OF MESH;  Surgeon: Jamesetta So, MD;  Location: [facility];  Service: General;  Laterality: Left;  . KNEE ARTHROSCOPY Left 07/21/2014   Procedure: LEFT KNEE ARTHROSCOPY WITH MEDIAL MENISCAL DEBRIDEMENT ;  Surgeon: Gearlean Alf, MD;  Location: [facility];  Service: Orthopedics;  Laterality: Left;  . LAPAROTOMY N/A 09/04/2012   Procedure: EXPLORATORY LAPAROTOMY;  Surgeon: Donato Heinz, MD;  Location: [facility];  Service: General;  Laterality: N/A;  . SKIN LESION EXCISION     [name]  . TONSILLECTOMY          Home Medications    Prior to Admission medications   Medication Sig Start Date End Date Taking? Authorizing Provider  albuterol (PROVENTIL) (2.5 MG/3ML) 0.083% nebulizer solution Take 3 mLs (2.5 mg total) by nebulization every 6 (six) hours as needed for wheezing or shortness of breath. 02/20/18  Yes Juanito Doom, MD  Azelastine HCl 137 MCG/SPRAY  SOLN PLACE 1 SPRAY INTO THE NOSE 1 DAY OR 1 DOSE. USE IN EACH NOSTRIL AS DIRECTED 07/25/18  Yes Chipper Herb, MD  budesonide (ENTOCORT EC) 3 MG 24 hr capsule Take by mouth. 06/13/18  Yes [provider]  Cholecalciferol (VITAMIN D3) 2000 UNITS TABS Take 1 tablet by mouth daily.   Yes [provider]  Cyanocobalamin (VITAMIN B-12) 5000 MCG SUBL Place 1 tablet under the tongue daily.   Yes [provider]  doxazosin (CARDURA) 8 MG tablet Take 0.5-1 tablets (4-8 mg  total) by mouth daily. 09/16/18  Yes Chipper Herb, MD  finasteride (PROSCAR) 5 MG tablet Take 1 tablet (5 mg total) by mouth daily. 09/16/18  Yes Chipper Herb, MD  fluticasone [facility]) 50 MCG/ACT nasal spray 1 SPRAY IN EACH NOSTRIL ONCE A DAY 07/25/18  Yes Chipper Herb, MD  guaiFENesin (MUCINEX) 600 MG 12 hr tablet Take 600 mg by mouth as needed. Reported on 05/25/2015   Yes [provider]  latanoprost (XALATAN) 0.005 % ophthalmic solution Place 1 drop into the left eye at bedtime. 06/01/16  Yes Chipper Herb, MD  Respiratory Therapy Supplies (FLUTTER) DEVI 1 Device by Other route as needed. 07/05/16  Yes Javier Glazier, MD  sodium chloride HYPERTONIC 3 % nebulizer solution Take by nebulization 2 (two) times daily. 02/20/18  Yes Juanito Doom, MD    Family History Family History  Problem Relation Age of Onset  . Heart disease Mother        Valve replacement and pacemaker  . Colon cancer Father   . Asthma Other        cousin  . Healthy Daughter   . Healthy Daughter   . Healthy Daughter     Social History Social History   Tobacco Use  . Smoking status: Former Smoker    Packs/day: 1.00    Years: 30.00    Pack years: 30.00    Types: Cigarettes    Quit date: 05/28/2006    Years since quitting: 12.5  . Smokeless tobacco: Never Used  Substance Use Topics  . Alcohol use: Not Currently    Alcohol/week: 0.0 standard drinks  . Drug use: No     Allergies   Demerol [meperidine] and Spiriva handihaler [tiotropium bromide monohydrate]   Review of Systems Review of Systems  Constitutional: Negative for chills and fever.  Gastrointestinal: Positive for abdominal pain. Negative for constipation, diarrhea, nausea and vomiting.  Genitourinary: Negative for difficulty urinating.  Skin: Negative for rash and wound.  Neurological: Negative for weakness.  Psychiatric/Behavioral: Negative for confusion.  All other systems reviewed and are negative.    Physical  Exam Updated Vital Signs BP (!) 133/114 (BP Location: Right Arm)   Pulse 72   Temp 98.2 F (36.8 C) (Oral)   Resp 16   Ht 5\' 10"  (1.778 m)   Wt 73.5 kg   SpO2 99%   BMI 23.24 kg/m   Physical Exam Vitals signs and nursing note reviewed.  Constitutional:      General: He is not in acute distress.    Appearance: He is well-developed. He is not diaphoretic.  HENT:     Head: Normocephalic and atraumatic.  Cardiovascular:     Rate and Rhythm: Normal rate and regular rhythm.     Pulses: Normal pulses.     Heart sounds: Normal heart sounds.  Pulmonary:     Effort: Pulmonary effort is normal.     Breath sounds: Normal breath  sounds.  Abdominal:     Tenderness: There is generalized abdominal tenderness.     Hernia: A hernia is present. Hernia is present in the ventral area and right inguinal area.    Skin:    General: Skin is warm and dry.     Findings: No erythema or rash.  Neurological:     Mental Status: He is alert and oriented to person, place, and time.  Psychiatric:        Behavior: Behavior normal.      ED Treatments / Results  Labs (all labs ordered are listed, but only abnormal results are displayed) Labs Reviewed - No data to display  EKG None  Radiology No results found.  Procedures Procedures (including critical care time)  Medications Ordered in ED Medications - No data to display   Initial Impression / Assessment and Plan / ED Course  I have reviewed the triage vital signs and the nursing notes.  Pertinent labs & imaging results that were available during my care of the patient were reviewed by me and considered in my medical decision making (see chart for details).  Clinical Course as of Dec 15 1503  Mon Dec 15, 1953  4826 85 year old male with known right inguinal hernia and ventral hernia.  Patient states that his surgeon has told him that if he is unable to reduce his hernia he should come to the emergency room.  Patient states he has tried  at home unsuccessfully to reduce the hernia, reports generalized abdominal pain.  He denies any associated nausea, vomiting, changes in bowel or bladder habits, fevers, chills.  On exam patient has a large right inguinal hernia which is tender to the touch, unable to reduce with patient lying supine.  Also noted to have generalized abdominal pain and tenderness around his small ventral hernia.  Patient was seen by Dr. Alvino Chapel, ER attending, placed in Trendelenburg with ice pack applied to inguinal hernia.  On reassessment, Dr. Alvino Chapel was able to reduce the hernia, pain in the abdomen has resolved.  Patient was observed in the emergency room, he is ambulatory to the bathroom without assistance and with resolution of his symptoms today.  Patient to follow-up with his surgeon, recommend he try a hernia truss or belt.  Return to ER if unable to reduce his hernia.   [LM]    Clinical Course User Index [LM] Tacy Learn, PA-C      Final Clinical Impressions(s) / ED Diagnoses   Final diagnoses:  Unilateral recurrent inguinal hernia without obstruction or gangrene    ED Discharge Orders    None       Tacy Learn, PA-C 12/15/18 1505    Davonna Belling, MD 12/16/18 [phone]

## 2018-12-15 NOTE — ED Notes (Signed)
Inguinal hernia noted to right side

## 2018-12-18 ENCOUNTER — Encounter (HOSPITAL_COMMUNITY): Payer: Self-pay

## 2018-12-18 ENCOUNTER — Ambulatory Visit (INDEPENDENT_AMBULATORY_CARE_PROVIDER_SITE_OTHER): Payer: Medicare Other | Admitting: General Surgery

## 2018-12-18 ENCOUNTER — Other Ambulatory Visit: Payer: Self-pay

## 2018-12-18 ENCOUNTER — Encounter (HOSPITAL_COMMUNITY)
Admission: RE | Admit: 2018-12-18 | Discharge: 2018-12-18 | Disposition: A | Discharge status: Home / Self Care | Payer: Medicare Other | Source: Ambulatory Visit | Attending: General Surgery | Admitting: General Surgery

## 2018-12-18 ENCOUNTER — Encounter: Payer: Self-pay | Admitting: General Surgery

## 2018-12-18 VITALS — BP 119/77 | HR 70 | Temp 96.8°F | Resp 16 | Ht 70.5 in | Wt 173.0 lb

## 2018-12-18 DIAGNOSIS — Z20828 Contact with and (suspected) exposure to other viral communicable diseases: Secondary | ICD-10-CM | POA: Insufficient documentation

## 2018-12-18 DIAGNOSIS — Z01812 Encounter for preprocedural laboratory examination: Secondary | ICD-10-CM | POA: Insufficient documentation

## 2018-12-18 DIAGNOSIS — K4091 Unilateral inguinal hernia, without obstruction or gangrene, recurrent: Secondary | ICD-10-CM | POA: Diagnosis not present

## 2018-12-18 HISTORY — DX: Unspecified glaucoma: H40.9

## 2018-12-18 NOTE — Patient Instructions (Signed)
Inguinal Hernia, Adult °An inguinal hernia develops when fat or the intestines push through a weak spot in a muscle where your leg meets your lower abdomen (groin). This creates a bulge. This kind of hernia could also be: °· In your scrotum, if you are male. °· In folds of skin around your vagina, if you are male. °There are three types of inguinal hernias: °· Hernias that can be pushed back into the abdomen (are reducible). This type rarely causes pain. °· Hernias that are not reducible (are incarcerated). °· Hernias that are not reducible and lose their blood supply (are strangulated). This type of hernia requires emergency surgery. °What are the causes? °This condition is caused by having a weak spot in the muscles or tissues in the groin. This weak spot develops over time. The hernia may poke through the weak spot when you suddenly strain your lower abdominal muscles, such as when you: °· Lift a heavy object. °· Strain to have a bowel movement. Constipation can lead to straining. °· Cough. °What increases the risk? °This condition is more likely to develop in: °· Men. °· Pregnant women. °· People who: °? Are overweight. °? Work in jobs that require long periods of standing or heavy lifting. °? Have had an inguinal hernia before. °? Smoke or have lung disease. These factors can lead to long-lasting (chronic) coughing. °What are the signs or symptoms? °Symptoms may depend on the size of the hernia. Often, a small inguinal hernia has no symptoms. Symptoms of a larger hernia may include: °· A lump in the groin area. This is easier to see when standing. It might not be visible when lying down. °· Pain or burning in the groin. This may get worse when lifting, straining, or coughing. °· A dull ache or a feeling of pressure in the groin. °· In men, an unusual lump in the scrotum. °Symptoms of a strangulated inguinal hernia may include: °· A bulge in your groin that is very painful and tender to the touch. °· A bulge  that turns red or purple. °· Fever, nausea, and vomiting. °· Inability to have a bowel movement or to pass gas. °How is this diagnosed? °This condition is diagnosed based on your symptoms, your medical history, and a physical exam. Your health care provider may feel your groin area and ask you to cough. °How is this treated? °Treatment depends on the size of your hernia and whether you have symptoms. If you do not have symptoms, your health care provider may have you watch your hernia carefully and have you come in for follow-up visits. If your hernia is large or if you have symptoms, you may need surgery to repair the hernia. °Follow these instructions at home: °Lifestyle °· Avoid lifting heavy objects. °· Avoid standing for long periods of time. °· Do not use any products that contain nicotine or tobacco, such as cigarettes and e-cigarettes. If you need help quitting, ask your health care provider. °· Maintain a healthy weight. °Preventing constipation °· Take actions to prevent constipation. Constipation leads to straining with bowel movements, which can make a hernia worse or cause a hernia repair to break down. Your health care provider may recommend that you: °? Drink enough fluid to keep your urine pale yellow. °? Eat foods that are high in fiber, such as fresh fruits and vegetables, whole grains, and beans. °? Limit foods that are high in fat and processed sugars, such as fried or sweet foods. °? Take an over-the-counter   or prescription medicine for constipation. °General instructions °· You may try to push the hernia back in place by very gently pressing on it while lying down. Do not try to force the bulge back in if it will not push in easily. °· Watch your hernia for any changes in shape, size, or color. Get help right away if you notice any changes. °· Take over-the-counter and prescription medicines only as told by your health care provider. °· Keep all follow-up visits as told by your health care  provider. This is important. °Contact a health care provider if: °· You have a fever. °· You develop new symptoms. °· Your symptoms get worse. °Get help right away if: °· You have pain in your groin that suddenly gets worse. °· You have a bulge in your groin that: °? Suddenly gets bigger and does not get smaller. °? Becomes red or purple or painful to the touch. °· You are a man and you have a sudden pain in your scrotum, or the size of your scrotum suddenly changes. °· You cannot push the hernia back in place by very gently pressing on it when you are lying down. Do not try to force the bulge back in if it will not push in easily. °· You have nausea or vomiting that does not go away. °· You have a fast heartbeat. °· You cannot have a bowel movement or pass gas. °These symptoms may represent a serious problem that is an emergency. Do not wait to see if the symptoms will go away. Get medical help right away. Call your local emergency services (911 in the [place] °Summary °· An inguinal hernia develops when fat or the intestines push through a weak spot in a muscle where your leg meets your lower abdomen (groin). °· This condition is caused by having a weak spot in muscles or tissue in your groin. °· Symptoms may depend on the size of the hernia, and they may include pain or swelling in your groin. A small inguinal hernia often has no symptoms. °· Treatment may not be needed if you do not have symptoms. If you have symptoms or a large hernia, you may need surgery to repair the hernia. °· Avoid lifting heavy objects. Also avoid standing for long amounts of time. °This information is not intended to replace advice given to you by your health care provider. Make sure you discuss any questions you have with your health care provider. °Document Released: 09/30/2008 Document Revised: 06/15/2017 Document Reviewed: 02/13/2017 °Elsevier Patient Education © 2020 [employer] ° °

## 2018-12-18 NOTE — H&P (Signed)
Charles Golden; [id number]; Aug 03, 1925   HPI Patient is a 85 year old white male who was referred to my care by Dr. Laurance Flatten in the emergency room for evaluation treatment of a right inguinal hernia.  Patient has had a known right inguinal hernia for some time now, but he presented to the emergency room earlier this week with incarceration.  It was difficult to reduce it but it was reduced by the ER physician.  He states that it is made worse with straining.  It does keep popping out and he pushes it back in.  He denies any nausea or vomiting.  He has had no recent fevers.  He states the pain is 2 out of 10. Past Medical History:  Diagnosis Date  . Bronchitis, chronic ([place])   . COPD (chronic obstructive pulmonary disease) ([place])   . Enlarged prostate   . GERD (gastroesophageal reflux disease)   . Glucagonoma   . Hernia, incisional    at present  . HOH (hard of hearing)   . IBS (irritable bowel syndrome)   . Sciatic pain   . Sinus congestion     Past Surgical History:  Procedure Laterality Date  . BOWEL RESECTION N/A 09/04/2012   Procedure: SMALL BOWEL RESECTION;  Surgeon: Donato Heinz, MD;  Location: [facility];  Service: General;  Laterality: N/A;  Anastimosis  . HEMORRHOID SURGERY    . HERNIA REPAIR Right 70's  . INGUINAL HERNIA REPAIR Left 09/14/2013   Procedure: LEFT INGUINAL HERNIORRHAPHY;  Surgeon: Jamesetta So, MD;  Location: [facility];  Service: General;  Laterality: Left;  . INSERTION OF MESH Left 09/14/2013   Procedure: INSERTION OF MESH;  Surgeon: Jamesetta So, MD;  Location: [facility];  Service: General;  Laterality: Left;  . KNEE ARTHROSCOPY Left 07/21/2014   Procedure: LEFT KNEE ARTHROSCOPY WITH MEDIAL MENISCAL DEBRIDEMENT ;  Surgeon: Gearlean Alf, MD;  Location: [facility];  Service: Orthopedics;  Laterality: Left;  . LAPAROTOMY N/A 09/04/2012   Procedure: EXPLORATORY LAPAROTOMY;  Surgeon: Donato Heinz, MD;  Location: [facility];  Service: General;  Laterality: N/A;  . SKIN LESION  EXCISION     [name]  . TONSILLECTOMY      Family History  Problem Relation Age of Onset  . Heart disease Mother        Valve replacement and pacemaker  . Colon cancer Father   . Asthma Other        cousin  . Healthy Daughter   . Healthy Daughter   . Healthy Daughter     Current Outpatient Medications on File Prior to Visit  Medication Sig Dispense Refill  . albuterol (PROVENTIL) (2.5 MG/3ML) 0.083% nebulizer solution Take 3 mLs (2.5 mg total) by nebulization every 6 (six) hours as needed for wheezing or shortness of breath. 75 mL 3  . Azelastine HCl 137 MCG/SPRAY SOLN PLACE 1 SPRAY INTO THE NOSE 1 DAY OR 1 DOSE. USE IN EACH NOSTRIL AS DIRECTED 30 mL 5  . budesonide (ENTOCORT EC) 3 MG 24 hr capsule Take by mouth.    . Cholecalciferol (VITAMIN D3) 2000 UNITS TABS Take 1 tablet by mouth daily.    . Cyanocobalamin (VITAMIN B-12) 5000 MCG SUBL Place 1 tablet under the tongue daily.    Marland Kitchen doxazosin (CARDURA) 8 MG tablet Take 0.5-1 tablets (4-8 mg total) by mouth daily. 30 tablet 4  . finasteride (PROSCAR) 5 MG tablet Take 1 tablet (5 mg total) by mouth daily. 90 tablet 0  .  fluticasone (FLONASE) 50 MCG/ACT nasal spray 1 SPRAY IN EACH NOSTRIL ONCE A DAY 16 g 5  . guaiFENesin (MUCINEX) 600 MG 12 hr tablet Take 600 mg by mouth as needed. Reported on 05/25/2015    . latanoprost (XALATAN) 0.005 % ophthalmic solution Place 1 drop into the left eye at bedtime. 2.5 mL 1  . Respiratory Therapy Supplies (FLUTTER) DEVI 1 Device by Other route as needed. 1 each 0  . sodium chloride HYPERTONIC 3 % nebulizer solution Take by nebulization 2 (two) times daily. 750 mL 3   No current facility-administered medications on file prior to visit.     Allergies  Allergen Reactions  . Demerol [Meperidine] Other (See Comments)    Pt. States "he woke up during a colonoscopy"  . Spiriva Handihaler [Tiotropium Bromide Monohydrate] Other (See Comments)    Mild Urinary Retention    Social History    Substance and Sexual Activity  Alcohol Use Not Currently  . Alcohol/week: 0.0 standard drinks    Social History   Tobacco Use  Smoking Status Former Smoker  . Packs/day: 1.00  . Years: 30.00  . Pack years: 30.00  . Types: Cigarettes  . Quit date: 05/28/2006  . Years since quitting: 12.5  Smokeless Tobacco Never Used    Review of Systems  Constitutional: Negative.   HENT: Positive for hearing loss.   Eyes: Negative.   Respiratory: Negative.   Cardiovascular: Negative.   Gastrointestinal: Positive for abdominal pain.  Genitourinary: Negative.   Musculoskeletal: Negative.   Skin: Negative.   Neurological: Negative.   Endo/Heme/Allergies: Negative.   Psychiatric/Behavioral: Negative.     Objective   Vitals:   12/18/18 0952  BP: 119/77  Pulse: 70  Resp: 16  Temp: (!) 96.8 F (36 C)  SpO2: 94%    Physical Exam Vitals signs reviewed.  Constitutional:      Appearance: Normal appearance. He is normal weight. He is not ill-appearing.  HENT:     Head: Normocephalic and atraumatic.  Cardiovascular:     Rate and Rhythm: Normal rate and regular rhythm.     Heart sounds: Normal heart sounds. No murmur. No friction rub. No gallop.   Pulmonary:     Effort: Pulmonary effort is normal. No respiratory distress.     Breath sounds: Normal breath sounds. No stridor. No wheezing, rhonchi or rales.  Abdominal:     General: Abdomen is flat. Bowel sounds are normal. There is no distension.     Palpations: Abdomen is soft. There is no mass.     Tenderness: There is no abdominal tenderness. There is no guarding or rebound.     Hernia: A hernia is present.     Comments: A reducible right inguinal hernia is noted.  No left inguinal hernias noted.  He also has an upper midline incisional hernia from a previous abdominal surgery.  This is large and easily reducible.  Skin:    General: Skin is warm and dry.  Neurological:     Mental Status: He is alert and oriented to person, place,  and time.    Previous notes reviewed Assessment  Recurrent right inguinal hernia Plan   Patient is scheduled for a recurrent right inguinal herniorrhaphy with mesh on 12/23/2018.  The risks and benefits of the procedure including bleeding, infection, mesh use, the possibility of recurrence of the hernia were fully explained to the patient, who gave informed consent.

## 2018-12-18 NOTE — Patient Instructions (Signed)
DEMBA NIGH  12/18/2018     @PREFPERIOPPHARMACY @   Your procedure is scheduled on  12/23/2018  Report to [facility] at  [phone]  A.M.  Call this number if you have problems the morning of surgery:  [phone]   Remember:  Do not eat or drink after midnight.                         Take these medicines the morning of surgery with A SIP OF WATER  CARDURA, Albuterol inhaler.    Do not wear jewelry, make-up or nail polish.  Do not wear lotions, powders, or perfumes, or deodorant.  Do not shave 48 hours prior to surgery.  Men may shave face and neck.  Do not bring valuables to the hospital.  [facility] is not responsible for any belongings or valuables.  Contacts, dentures or bridgework may not be worn into surgery.  Leave your suitcase in the car.  After surgery it may be brought to your room.  For patients admitted to the hospital, discharge time will be determined by your treatment team.  Patients discharged the day of surgery will not be allowed to drive home.   Name and phone number of your driver:   daughter Special instructions:  None  Please read over the following fact sheets that you were given. Anesthesia Post-op Instructions and Care and Recovery After Surgery       Open Hernia Repair, Adult, Care After These instructions give you information about caring for yourself after your procedure. Your doctor may also give you more specific instructions. If you have problems or questions, contact your doctor. Follow these instructions at home: Surgical cut (incision) care   Follow instructions from your doctor about how to take care of your surgical cut area. Make sure you: ? Wash your hands with soap and water before you change your bandage (dressing). If you cannot use soap and water, use hand sanitizer. ? Change your bandage as told by your doctor. ? Leave stitches (sutures), skin glue, or skin tape (adhesive) strips in place. They may need to stay in  place for 2 weeks or longer. If tape strips get loose and curl up, you may trim the loose edges. Do not remove tape strips completely unless your doctor says it is okay.  Check your surgical cut every day for signs of infection. Check for: ? More redness, swelling, or pain. ? More fluid or blood. ? Warmth. ? Pus or a bad smell. Activity  Do not drive or use heavy machinery while taking prescription pain medicine. Do not drive until your doctor says it is okay.  Until your doctor says it is okay: ? Do not lift anything that is heavier than 10 lb (4.5 kg). ? Do not play contact sports.  Return to your normal activities as told by your doctor. Ask your doctor what activities are safe. General instructions  To prevent or treat having a hard time pooping (constipation) while you are taking prescription pain medicine, your doctor may recommend that you: ? Drink enough fluid to keep your pee (urine) clear or pale yellow. ? Take over-the-counter or prescription medicines. ? Eat foods that are high in fiber, such as fresh fruits and vegetables, whole grains, and beans. ? Limit foods that are high in fat and processed sugars, such as fried and sweet foods.  Take over-the-counter and prescription medicines only as told  by your doctor.  Do not take baths, swim, or use a hot tub until your doctor says it is okay.  Keep all follow-up visits as told by your doctor. This is important. Contact a doctor if:  You develop a rash.  You have more redness, swelling, or pain around your surgical cut.  You have more fluid or blood coming from your surgical cut.  Your surgical cut feels warm to the touch.  You have pus or a bad smell coming from your surgical cut.  You have a fever or chills.  You have blood in your poop (stool).  You have not pooped in 2-3 days.  Medicine does not help your pain. Get help right away if:  You have chest pain or you are short of breath.  You feel  light-headed.  You feel weak and dizzy (feel faint).  You have very bad pain.  You throw up (vomit) and your pain is worse. This information is not intended to replace advice given to you by your health care provider. Make sure you discuss any questions you have with your health care provider. Document Released: 06/04/2014 Document Revised: 09/05/2018 Document Reviewed: 10/26/2015 Elsevier Patient Education  2020 [place] Anesthesia, Adult, Care After This sheet gives you information about how to care for yourself after your procedure. Your health care provider may also give you more specific instructions. If you have problems or questions, contact your health care provider. What can I expect after the procedure? After the procedure, the following side effects are common:  Pain or discomfort at the IV site.  Nausea.  Vomiting.  Sore throat.  Trouble concentrating.  Feeling cold or chills.  Weak or tired.  Sleepiness and fatigue.  Soreness and body aches. These side effects can affect parts of the body that were not involved in surgery. Follow these instructions at home:  For at least 24 hours after the procedure:  Have a responsible adult stay with you. It is important to have someone help care for you until you are awake and alert.  Rest as needed.  Do not: ? Participate in activities in which you could fall or become injured. ? Drive. ? Use heavy machinery. ? Drink alcohol. ? Take sleeping pills or medicines that cause drowsiness. ? Make important decisions or sign legal documents. ? Take care of children on your own. Eating and drinking  Follow any instructions from your health care provider about eating or drinking restrictions.  When you feel hungry, start by eating small amounts of foods that are soft and easy to digest (bland), such as toast. Gradually return to your regular diet.  Drink enough fluid to keep your urine pale yellow.  If  you vomit, rehydrate by drinking water, juice, or clear broth. General instructions  If you have sleep apnea, surgery and certain medicines can increase your risk for breathing problems. Follow instructions from your health care provider about wearing your sleep device: ? Anytime you are sleeping, including during daytime naps. ? While taking prescription pain medicines, sleeping medicines, or medicines that make you drowsy.  Return to your normal activities as told by your health care provider. Ask your health care provider what activities are safe for you.  Take over-the-counter and prescription medicines only as told by your health care provider.  If you smoke, do not smoke without supervision.  Keep all follow-up visits as told by your health care provider. This is important. Contact a health care provider if:  You have nausea or vomiting that does not get better with medicine.  You cannot eat or drink without vomiting.  You have pain that does not get better with medicine.  You are unable to pass urine.  You develop a skin rash.  You have a fever.  You have redness around your IV site that gets worse. Get help right away if:  You have difficulty breathing.  You have chest pain.  You have blood in your urine or stool, or you vomit blood. Summary  After the procedure, it is common to have a sore throat or nausea. It is also common to feel tired.  Have a responsible adult stay with you for the first 24 hours after general anesthesia. It is important to have someone help care for you until you are awake and alert.  When you feel hungry, start by eating small amounts of foods that are soft and easy to digest (bland), such as toast. Gradually return to your regular diet.  Drink enough fluid to keep your urine pale yellow.  Return to your normal activities as told by your health care provider. Ask your health care provider what activities are safe for you. This  information is not intended to replace advice given to you by your health care provider. Make sure you discuss any questions you have with your health care provider. Document Released: 08/20/2000 Document Revised: 05/17/2017 Document Reviewed: 12/28/2016 Elsevier Patient Education  2020 [employer]. How to Use Chlorhexidine for Bathing Chlorhexidine gluconate (CHG) is a germ-killing (antiseptic) solution that is used to clean the skin. It can get rid of the bacteria that normally live on the skin and can keep them away for about 24 hours. To clean your skin with CHG, you may be given:  A CHG solution to use in the shower or as part of a sponge bath.  A prepackaged cloth that contains CHG. Cleaning your skin with CHG may help lower the risk for infection:  While you are staying in the intensive care unit of the hospital.  If you have a vascular access, such as a central line, to provide short-term or long-term access to your veins.  If you have a catheter to drain urine from your bladder.  If you are on a ventilator. A ventilator is a machine that helps you breathe by moving air in and out of your lungs.  After surgery. What are the risks? Risks of using CHG include:  A skin reaction.  Hearing loss, if CHG gets in your ears.  Eye injury, if CHG gets in your eyes and is not rinsed out.  The CHG product catching fire. Make sure that you avoid smoking and flames after applying CHG to your skin. Do not use CHG:  If you have a chlorhexidine allergy or have previously reacted to chlorhexidine.  On babies younger than 63 months of age. How to use CHG solution  Use CHG only as told by your health care provider, and follow the instructions on the label.  Use the full amount of CHG as directed. Usually, this is one bottle. During a shower Follow these steps when using CHG solution during a shower (unless your health care provider gives you different instructions): 1. Start the  shower. 2. Use your normal soap and shampoo to wash your face and hair. 3. Turn off the shower or move out of the shower stream. 4. Pour the CHG onto a clean washcloth. Do not use any type of brush or  rough-edged sponge. 5. Starting at your neck, lather your body down to your toes. Make sure you follow these instructions: ? If you will be having surgery, pay special attention to the part of your body where you will be having surgery. Scrub this area for at least 1 minute. ? Do not use CHG on your head or face. If the solution gets into your ears or eyes, rinse them well with water. ? Avoid your genital area. ? Avoid any areas of skin that have broken skin, cuts, or scrapes. ? Scrub your back and under your arms. Make sure to wash skin folds. 6. Let the lather sit on your skin for 1-2 minutes or as long as told by your health care provider. 7. Thoroughly rinse your entire body in the shower. Make sure that all body creases and crevices are rinsed well. 8. Dry off with a clean towel. Do not put any substances on your body afterward-such as powder, lotion, or perfume-unless you are told to do so by your health care provider. Only use lotions that are recommended by the manufacturer. 9. Put on clean clothes or pajamas. 10. If it is the night before your surgery, sleep in clean sheets.  During a sponge bath Follow these steps when using CHG solution during a sponge bath (unless your health care provider gives you different instructions): 1. Use your normal soap and shampoo to wash your face and hair. 2. Pour the CHG onto a clean washcloth. 3. Starting at your neck, lather your body down to your toes. Make sure you follow these instructions: ? If you will be having surgery, pay special attention to the part of your body where you will be having surgery. Scrub this area for at least 1 minute. ? Do not use CHG on your head or face. If the solution gets into your ears or eyes, rinse them well with  water. ? Avoid your genital area. ? Avoid any areas of skin that have broken skin, cuts, or scrapes. ? Scrub your back and under your arms. Make sure to wash skin folds. 4. Let the lather sit on your skin for 1-2 minutes or as long as told by your health care provider. 5. Using a different clean, wet washcloth, thoroughly rinse your entire body. Make sure that all body creases and crevices are rinsed well. 6. Dry off with a clean towel. Do not put any substances on your body afterward-such as powder, lotion, or perfume-unless you are told to do so by your health care provider. Only use lotions that are recommended by the manufacturer. 7. Put on clean clothes or pajamas. 8. If it is the night before your surgery, sleep in clean sheets. How to use CHG prepackaged cloths  Only use CHG cloths as told by your health care provider, and follow the instructions on the label.  Use the CHG cloth on clean, dry skin.  Do not use the CHG cloth on your head or face unless your health care provider tells you to.  When washing with the CHG cloth: ? Avoid your genital area. ? Avoid any areas of skin that have broken skin, cuts, or scrapes. Before surgery Follow these steps when using a CHG cloth to clean before surgery (unless your health care provider gives you different instructions): 1. Using the CHG cloth, vigorously scrub the part of your body where you will be having surgery. Scrub using a back-and-forth motion for 3 minutes. The area on your body should be completely  wet with CHG when you are done scrubbing. 2. Do not rinse. Discard the cloth and let the area air-dry. Do not put any substances on the area afterward, such as powder, lotion, or perfume. 3. Put on clean clothes or pajamas. 4. If it is the night before your surgery, sleep in clean sheets.  For general bathing Follow these steps when using CHG cloths for general bathing (unless your health care provider gives you different  instructions). 1. Use a separate CHG cloth for each area of your body. Make sure you wash between any folds of skin and between your fingers and toes. Wash your body in the following order, switching to a new cloth after each step: ? The front of your neck, shoulders, and chest. ? Both of your arms, under your arms, and your hands. ? Your stomach and groin area, avoiding the genitals. ? Your right leg and foot. ? Your left leg and foot. ? The back of your neck, your back, and your buttocks. 2. Do not rinse. Discard the cloth and let the area air-dry. Do not put any substances on your body afterward-such as powder, lotion, or perfume-unless you are told to do so by your health care provider. Only use lotions that are recommended by the manufacturer. 3. Put on clean clothes or pajamas. Contact a health care provider if:  Your skin gets irritated after scrubbing.  You have questions about using your solution or cloth. Get help right away if:  Your eyes become very red or swollen.  Your eyes itch badly.  Your skin itches badly and is red or swollen.  Your hearing changes.  You have trouble seeing.  You have swelling or tingling in your mouth or throat.  You have trouble breathing.  You swallow any chlorhexidine. Summary  Chlorhexidine gluconate (CHG) is a germ-killing (antiseptic) solution that is used to clean the skin. Cleaning your skin with CHG may help to lower your risk for infection.  You may be given CHG to use for bathing. It may be in a bottle or in a prepackaged cloth to use on your skin. Carefully follow your health care provider's instructions and the instructions on the product label.  Do not use CHG if you have a chlorhexidine allergy.  Contact your health care provider if your skin gets irritated after scrubbing. This information is not intended to replace advice given to you by your health care provider. Make sure you discuss any questions you have with your  health care provider. Document Released: 02/06/2012 Document Revised: 07/31/2018 Document Reviewed: 04/11/2017 Elsevier Patient Education  2020 [employer].

## 2018-12-18 NOTE — Progress Notes (Signed)
Charles Golden; [id number]; 1926-04-07   HPI Patient is a 85 year old white male who was referred to my care by Dr. Laurance Flatten in the emergency room for evaluation treatment of a right inguinal hernia.  Patient has had a known right inguinal hernia for some time now, but he presented to the emergency room earlier this week with incarceration.  It was difficult to reduce it but it was reduced by the ER physician.  He states that it is made worse with straining.  It does keep popping out and he pushes it back in.  He denies any nausea or vomiting.  He has had no recent fevers.  He states the pain is 2 out of 10. Past Medical History:  Diagnosis Date  . Bronchitis, chronic ([place])   . COPD (chronic obstructive pulmonary disease) ([place])   . Enlarged prostate   . GERD (gastroesophageal reflux disease)   . Glucagonoma   . Hernia, incisional    at present  . HOH (hard of hearing)   . IBS (irritable bowel syndrome)   . Sciatic pain   . Sinus congestion     Past Surgical History:  Procedure Laterality Date  . BOWEL RESECTION N/A 09/04/2012   Procedure: SMALL BOWEL RESECTION;  Surgeon: Donato Heinz, MD;  Location: [facility];  Service: General;  Laterality: N/A;  Anastimosis  . HEMORRHOID SURGERY    . HERNIA REPAIR Right 70's  . INGUINAL HERNIA REPAIR Left 09/14/2013   Procedure: LEFT INGUINAL HERNIORRHAPHY;  Surgeon: Jamesetta So, MD;  Location: [facility];  Service: General;  Laterality: Left;  . INSERTION OF MESH Left 09/14/2013   Procedure: INSERTION OF MESH;  Surgeon: Jamesetta So, MD;  Location: [facility];  Service: General;  Laterality: Left;  . KNEE ARTHROSCOPY Left 07/21/2014   Procedure: LEFT KNEE ARTHROSCOPY WITH MEDIAL MENISCAL DEBRIDEMENT ;  Surgeon: Gearlean Alf, MD;  Location: [facility];  Service: Orthopedics;  Laterality: Left;  . LAPAROTOMY N/A 09/04/2012   Procedure: EXPLORATORY LAPAROTOMY;  Surgeon: Donato Heinz, MD;  Location: [facility];  Service: General;  Laterality: N/A;  . SKIN LESION  EXCISION     [name]  . TONSILLECTOMY      Family History  Problem Relation Age of Onset  . Heart disease Mother        Valve replacement and pacemaker  . Colon cancer Father   . Asthma Other        cousin  . Healthy Daughter   . Healthy Daughter   . Healthy Daughter     Current Outpatient Medications on File Prior to Visit  Medication Sig Dispense Refill  . albuterol (PROVENTIL) (2.5 MG/3ML) 0.083% nebulizer solution Take 3 mLs (2.5 mg total) by nebulization every 6 (six) hours as needed for wheezing or shortness of breath. 75 mL 3  . Azelastine HCl 137 MCG/SPRAY SOLN PLACE 1 SPRAY INTO THE NOSE 1 DAY OR 1 DOSE. USE IN EACH NOSTRIL AS DIRECTED 30 mL 5  . budesonide (ENTOCORT EC) 3 MG 24 hr capsule Take by mouth.    . Cholecalciferol (VITAMIN D3) 2000 UNITS TABS Take 1 tablet by mouth daily.    . Cyanocobalamin (VITAMIN B-12) 5000 MCG SUBL Place 1 tablet under the tongue daily.    Marland Kitchen doxazosin (CARDURA) 8 MG tablet Take 0.5-1 tablets (4-8 mg total) by mouth daily. 30 tablet 4  . finasteride (PROSCAR) 5 MG tablet Take 1 tablet (5 mg total) by mouth daily. 90 tablet 0  .  fluticasone (FLONASE) 50 MCG/ACT nasal spray 1 SPRAY IN EACH NOSTRIL ONCE A DAY 16 g 5  . guaiFENesin (MUCINEX) 600 MG 12 hr tablet Take 600 mg by mouth as needed. Reported on 05/25/2015    . latanoprost (XALATAN) 0.005 % ophthalmic solution Place 1 drop into the left eye at bedtime. 2.5 mL 1  . Respiratory Therapy Supplies (FLUTTER) DEVI 1 Device by Other route as needed. 1 each 0  . sodium chloride HYPERTONIC 3 % nebulizer solution Take by nebulization 2 (two) times daily. 750 mL 3   No current facility-administered medications on file prior to visit.     Allergies  Allergen Reactions  . Demerol [Meperidine] Other (See Comments)    Pt. States "he woke up during a colonoscopy"  . Spiriva Handihaler [Tiotropium Bromide Monohydrate] Other (See Comments)    Mild Urinary Retention    Social History    Substance and Sexual Activity  Alcohol Use Not Currently  . Alcohol/week: 0.0 standard drinks    Social History   Tobacco Use  Smoking Status Former Smoker  . Packs/day: 1.00  . Years: 30.00  . Pack years: 30.00  . Types: Cigarettes  . Quit date: 05/28/2006  . Years since quitting: 12.5  Smokeless Tobacco Never Used    Review of Systems  Constitutional: Negative.   HENT: Positive for hearing loss.   Eyes: Negative.   Respiratory: Negative.   Cardiovascular: Negative.   Gastrointestinal: Positive for abdominal pain.  Genitourinary: Negative.   Musculoskeletal: Negative.   Skin: Negative.   Neurological: Negative.   Endo/Heme/Allergies: Negative.   Psychiatric/Behavioral: Negative.     Objective   Vitals:   12/18/18 0952  BP: 119/77  Pulse: 70  Resp: 16  Temp: (!) 96.8 F (36 C)  SpO2: 94%    Physical Exam Vitals signs reviewed.  Constitutional:      Appearance: Normal appearance. He is normal weight. He is not ill-appearing.  HENT:     Head: Normocephalic and atraumatic.  Cardiovascular:     Rate and Rhythm: Normal rate and regular rhythm.     Heart sounds: Normal heart sounds. No murmur. No friction rub. No gallop.   Pulmonary:     Effort: Pulmonary effort is normal. No respiratory distress.     Breath sounds: Normal breath sounds. No stridor. No wheezing, rhonchi or rales.  Abdominal:     General: Abdomen is flat. Bowel sounds are normal. There is no distension.     Palpations: Abdomen is soft. There is no mass.     Tenderness: There is no abdominal tenderness. There is no guarding or rebound.     Hernia: A hernia is present.     Comments: A reducible right inguinal hernia is noted.  No left inguinal hernias noted.  He also has an upper midline incisional hernia from a previous abdominal surgery.  This is large and easily reducible.  Skin:    General: Skin is warm and dry.  Neurological:     Mental Status: He is alert and oriented to person, place,  and time.    Previous notes reviewed Assessment  Recurrent right inguinal hernia Plan   Patient is scheduled for a recurrent right inguinal herniorrhaphy with mesh on 12/23/2018.  The risks and benefits of the procedure including bleeding, infection, mesh use, the possibility of recurrence of the hernia were fully explained to the patient, who gave informed consent.

## 2018-12-19 ENCOUNTER — Other Ambulatory Visit (HOSPITAL_COMMUNITY)
Admission: RE | Admit: 2018-12-19 | Discharge: 2018-12-19 | Disposition: A | Discharge status: Home / Self Care | Payer: Medicare Other | Source: Ambulatory Visit | Attending: General Surgery | Admitting: General Surgery

## 2018-12-19 DIAGNOSIS — Z01812 Encounter for preprocedural laboratory examination: Secondary | ICD-10-CM | POA: Diagnosis not present

## 2018-12-19 DIAGNOSIS — Z20828 Contact with and (suspected) exposure to other viral communicable diseases: Secondary | ICD-10-CM | POA: Diagnosis not present

## 2018-12-20 LAB — SARS CORONAVIRUS 2 (TAT 6-24 HRS): SARS Coronavirus 2: NEGATIVE

## 2018-12-22 ENCOUNTER — Encounter: Payer: Self-pay | Admitting: Family Medicine

## 2018-12-22 ENCOUNTER — Ambulatory Visit: Payer: Medicare Other | Admitting: Family Medicine

## 2018-12-22 ENCOUNTER — Ambulatory Visit (INDEPENDENT_AMBULATORY_CARE_PROVIDER_SITE_OTHER): Payer: Medicare Other | Admitting: Family Medicine

## 2018-12-22 DIAGNOSIS — J324 Chronic pansinusitis: Secondary | ICD-10-CM

## 2018-12-22 DIAGNOSIS — I700 Atherosclerosis of aorta: Secondary | ICD-10-CM | POA: Diagnosis not present

## 2018-12-22 DIAGNOSIS — J449 Chronic obstructive pulmonary disease, unspecified: Secondary | ICD-10-CM | POA: Diagnosis not present

## 2018-12-22 DIAGNOSIS — R351 Nocturia: Secondary | ICD-10-CM | POA: Diagnosis not present

## 2018-12-22 DIAGNOSIS — N401 Enlarged prostate with lower urinary tract symptoms: Secondary | ICD-10-CM

## 2018-12-22 MED ORDER — DOXAZOSIN MESYLATE 8 MG PO TABS: 4.0000 mg | ORAL_TABLET | Freq: Every day | ORAL | 4 refills | Status: DC

## 2018-12-22 MED ORDER — FLUTICASONE PROPIONATE 50 MCG/ACT NA SUSP: NASAL | 5 refills | Status: DC

## 2018-12-22 MED ORDER — FINASTERIDE 5 MG PO TABS: 5.0000 mg | ORAL_TABLET | Freq: Every day | ORAL | 0 refills | Status: DC

## 2018-12-22 NOTE — Progress Notes (Signed)
Virtual Visit via telephone Note Due to COVID-19 pandemic this visit was conducted virtually. This visit type was conducted due to national recommendations for restrictions regarding the COVID-19 Pandemic (e.g. social distancing, sheltering in place) in an effort to limit this patient's exposure and mitigate transmission in our community. All issues noted in this document were discussed and addressed.  A physical exam was not performed with this format.   I connected with Charles Golden on 12/22/18 at 1200 by telephone and verified that I am speaking with the correct person using two identifiers. This was the fourth attempt to reach pt.  DELVECCHIO MADOLE is currently located at home and family is currently with them during visit. The provider, [name], FNP is located in their office at time of visit.  I discussed the limitations, risks, security and privacy concerns of performing an evaluation and management service by telephone and the availability of in person appointments. I also discussed with the patient that there may be a patient responsible charge related to this service. The patient expressed understanding and agreed to proceed.  Subjective:  Patient ID: Charles Golden, male    DOB: June 12, 1925, 85 y.o.   MRN: [id number]  Chief Complaint:  Medical Management of Chronic Issues   HPI: Charles Golden is a 85 y.o. male presenting on 12/22/2018 for Medical Management of Chronic Issues  1. Benign prostatic hyperplasia with nocturia Doing well on medications. Still has a weak stream and nocturia. No hematuria, rectal pain, fever, chills, night sweats, or weight loss. No postural hypotension symptoms.   2. Thoracic aortic atherosclerosis (HCC) No chest pain, shortness of breath, palpitations, dizziness, or syncope. No abdominal pain or weakness.   3. Chronic pansinusitis On daily nasal sprays with great relief of symptoms.   4. Obstructive chronic bronchitis without exacerbation ([place]) Pt  does have some exertional shortness of breath. States he has a follow up with pulmonology and will mention this to them. No increased sputum production or cough. No fever, chills, weakness, or confusion. Slight fatigue.   Relevant past medical, surgical, family, and social history reviewed and updated as indicated.  Allergies and medications reviewed and updated.   Past Medical History:  Diagnosis Date  . Bronchitis, chronic ([place])   . COPD (chronic obstructive pulmonary disease) ([place])   . Enlarged prostate   . GERD (gastroesophageal reflux disease)   . Glaucoma   . Glucagonoma   . Hernia, incisional    at present  . HOH (hard of hearing)   . IBS (irritable bowel syndrome)   . Sciatic pain   . Sinus congestion     Past Surgical History:  Procedure Laterality Date  . BOWEL RESECTION N/A 09/04/2012   Procedure: SMALL BOWEL RESECTION;  Surgeon: Donato Heinz, MD;  Location: [facility];  Service: General;  Laterality: N/A;  Anastimosis  . HEMORRHOID SURGERY    . HERNIA REPAIR Bilateral 70's  . INGUINAL HERNIA REPAIR Left 09/14/2013   Procedure: LEFT INGUINAL HERNIORRHAPHY;  Surgeon: Jamesetta So, MD;  Location: [facility];  Service: General;  Laterality: Left;  . INSERTION OF MESH Left 09/14/2013   Procedure: INSERTION OF MESH;  Surgeon: Jamesetta So, MD;  Location: [facility];  Service: General;  Laterality: Left;  . KNEE ARTHROSCOPY Left 07/21/2014   Procedure: LEFT KNEE ARTHROSCOPY WITH MEDIAL MENISCAL DEBRIDEMENT ;  Surgeon: Gearlean Alf, MD;  Location: [facility];  Service: Orthopedics;  Laterality: Left;  . LAPAROTOMY N/A 09/04/2012  Procedure: EXPLORATORY LAPAROTOMY;  Surgeon: Donato Heinz, MD;  Location: [facility];  Service: General;  Laterality: N/A;  . SKIN LESION EXCISION     [name]  . TONSILLECTOMY      Social History   Socioeconomic History  . Marital status: Married    Spouse name: [name]  . Number of children: 3  . Years of education: 12+  . Highest education level:  Bachelor's degree (e.g., BA, AB, BS)  Occupational History  . Occupation: Retired    Comment: Landscape architect  . Financial resource strain: Not hard at all  . Food insecurity    Worry: Never true    Inability: Never true  . Transportation needs    Medical: No    Non-medical: No  Tobacco Use  . Smoking status: Former Smoker    Packs/day: 1.00    Years: 30.00    Pack years: 30.00    Types: Cigarettes    Quit date: 05/28/2006    Years since quitting: 12.5  . Smokeless tobacco: Never Used  Substance and Sexual Activity  . Alcohol use: Not Currently    Alcohol/week: 0.0 standard drinks  . Drug use: No  . Sexual activity: Yes    Birth control/protection: None  Lifestyle  . Physical activity    Days per week: 0 days    Minutes per session: 0 min  . Stress: Not at all  Relationships  . Social connections    Talks on phone: More than three times a week    Gets together: More than three times a week    Attends religious service: More than 4 times per year    Active member of club or organization: Yes    Attends meetings of clubs or organizations: More than 4 times per year    Relationship status: Married  . Intimate partner violence    Fear of current or ex partner: No    Emotionally abused: No    Physically abused: No    Forced sexual activity: No  Other Topics Concern  . Not on file  Social History Narrative   Patient lives at home with his wife [name].    Patient has 3 children.    Patient has his BS   Patient is retired.    Drinks about 2 cups of coffee per day.      [facility]:   He is still married. 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 He is from [place].        Outpatient Encounter Medications as of 12/22/2018  Medication Sig  . albuterol (PROVENTIL) (2.5 MG/3ML) 0.083% nebulizer solution Take 3 mLs (2.5 mg total) by nebulization every 6 (six) hours as needed for  wheezing or shortness of breath.  . Azelastine HCl 137 MCG/SPRAY SOLN PLACE 1 SPRAY INTO THE NOSE 1 DAY OR 1 DOSE. USE IN EACH NOSTRIL AS DIRECTED  . budesonide (ENTOCORT EC) 3 MG 24 hr capsule Take by mouth.  . Cholecalciferol (VITAMIN D3) 2000 UNITS TABS Take 1 tablet by mouth daily.  . Cyanocobalamin (VITAMIN B-12) 5000 MCG SUBL Place 1 tablet under the tongue daily.  Marland Kitchen doxazosin (CARDURA) 8 MG tablet Take 0.5-1 tablets (4-8 mg total) by mouth daily.  . finasteride (PROSCAR) 5 MG tablet Take 1 tablet (5 mg total) by mouth daily.  . fluticasone (FLONASE) 50 MCG/ACT nasal spray 1 SPRAY IN EACH NOSTRIL ONCE A DAY  .  guaiFENesin (MUCINEX) 600 MG 12 hr tablet Take 600 mg by mouth as needed. Reported on 05/25/2015  . latanoprost (XALATAN) 0.005 % ophthalmic solution Place 1 drop into the left eye at bedtime.  Marland Kitchen Respiratory Therapy Supplies (FLUTTER) DEVI 1 Device by Other route as needed.  . sodium chloride HYPERTONIC 3 % nebulizer solution Take by nebulization 2 (two) times daily.   No facility-administered encounter medications on file as of 12/22/2018.     Allergies  Allergen Reactions  . Demerol [Meperidine] Other (See Comments)    Pt. States "he woke up during a colonoscopy"  . Spiriva Handihaler [Tiotropium Bromide Monohydrate] Other (See Comments)    Mild Urinary Retention    Review of Systems  Constitutional: Positive for fatigue. Negative for activity change, appetite change, chills, diaphoresis, fever and unexpected weight change.  Eyes: Negative for photophobia and visual disturbance.  Respiratory: Positive for shortness of breath (exertional). Negative for cough and wheezing.   Cardiovascular: Negative for chest pain, palpitations and leg swelling.  Gastrointestinal: Negative for abdominal distention, abdominal pain, anal bleeding, blood in stool, constipation, diarrhea, nausea, rectal pain and vomiting.  Endocrine: Negative for polydipsia, polyphagia and polyuria.   Genitourinary: Positive for difficulty urinating. Negative for decreased urine volume, discharge, dysuria, flank pain, frequency, genital sores, hematuria, penile pain, penile swelling, scrotal swelling, testicular pain and urgency.  Musculoskeletal: Negative for arthralgias and myalgias.  Neurological: Negative for dizziness, tremors, seizures, syncope, facial asymmetry, speech difficulty, weakness, light-headedness, numbness and headaches.  All other systems reviewed and are negative.        Observations/Objective: No vital signs or physical exam, this was a telephone or virtual health encounter.  Pt alert and oriented, answers all questions appropriately, and able to speak in full sentences.    Assessment and Plan: Camrin was seen today for medical management of chronic issues.  Diagnoses and all orders for this visit:  Benign prostatic hyperplasia with nocturia Well controlled with medications. Continue medications as prescribed. Report any new or worsening symptoms.   Thoracic aortic atherosclerosis (HCC) Pt declines ASA therapy. No chest pain or abdominal pain.   Chronic pansinusitis Doing well on nasal sprays. No sinus pressure or pain. Continue symptomatic care.   Obstructive chronic bronchitis without exacerbation ([place]) Followed by pulmonology. Has upcoming appointment.     Follow Up Instructions: Return in about 3 months (around 03/24/2019), or if symptoms worsen or fail to improve, for chronic follow up.    I discussed the assessment and treatment plan with the patient. The patient was provided an opportunity to ask questions and all were answered. The patient agreed with the plan and demonstrated an understanding of the instructions.   The patient was advised to call back or seek an in-person evaluation if the symptoms worsen or if the condition fails to improve as anticipated.  The above assessment and management plan was discussed with the patient. The patient  verbalized understanding of and has agreed to the management plan. Patient is aware to call the clinic if symptoms persist or worsen. Patient is aware when to return to the clinic for a follow-up visit. Patient educated on when it is appropriate to go to the emergency department.    I provided 25 minutes of non-face-to-face time during this encounter. The call started at 1200. The call ended at 1225. The other time was used for coordination of care.    [name], FNP-C [facility] [address] [place], [place] [address] [phone] 12/22/18

## 2018-12-23 ENCOUNTER — Encounter (HOSPITAL_COMMUNITY)
Admission: RE | Disposition: A | Discharge status: Home / Self Care | Payer: Self-pay | Source: Home / Self Care | Attending: General Surgery

## 2018-12-23 ENCOUNTER — Ambulatory Visit (HOSPITAL_COMMUNITY)
Admission: RE | Admit: 2018-12-23 | Discharge: 2018-12-23 | Disposition: A | Discharge status: Home / Self Care | Payer: Medicare Other | Attending: General Surgery | Admitting: General Surgery

## 2018-12-23 ENCOUNTER — Ambulatory Visit (HOSPITAL_COMMUNITY): Payer: Medicare Other | Admitting: Anesthesiology

## 2018-12-23 ENCOUNTER — Encounter (HOSPITAL_COMMUNITY): Payer: Self-pay

## 2018-12-23 ENCOUNTER — Other Ambulatory Visit: Payer: Self-pay

## 2018-12-23 DIAGNOSIS — K219 Gastro-esophageal reflux disease without esophagitis: Secondary | ICD-10-CM | POA: Insufficient documentation

## 2018-12-23 DIAGNOSIS — K589 Irritable bowel syndrome without diarrhea: Secondary | ICD-10-CM | POA: Insufficient documentation

## 2018-12-23 DIAGNOSIS — Z79899 Other long term (current) drug therapy: Secondary | ICD-10-CM | POA: Diagnosis not present

## 2018-12-23 DIAGNOSIS — G709 Myoneural disorder, unspecified: Secondary | ICD-10-CM | POA: Insufficient documentation

## 2018-12-23 DIAGNOSIS — J449 Chronic obstructive pulmonary disease, unspecified: Secondary | ICD-10-CM | POA: Insufficient documentation

## 2018-12-23 DIAGNOSIS — K432 Incisional hernia without obstruction or gangrene: Secondary | ICD-10-CM | POA: Insufficient documentation

## 2018-12-23 DIAGNOSIS — N400 Enlarged prostate without lower urinary tract symptoms: Secondary | ICD-10-CM | POA: Diagnosis not present

## 2018-12-23 DIAGNOSIS — K4091 Unilateral inguinal hernia, without obstruction or gangrene, recurrent: Secondary | ICD-10-CM

## 2018-12-23 DIAGNOSIS — Z87891 Personal history of nicotine dependence: Secondary | ICD-10-CM | POA: Insufficient documentation

## 2018-12-23 HISTORY — PX: INGUINAL HERNIA REPAIR: SHX194

## 2018-12-23 SURGERY — REPAIR, HERNIA, INGUINAL, ADULT
Anesthesia: General | Laterality: Right

## 2018-12-23 MED ORDER — PROMETHAZINE HCL 25 MG/ML IJ SOLN: 6.2500 mg | INTRAMUSCULAR | Status: DC | PRN

## 2018-12-23 MED ORDER — CEFAZOLIN SODIUM-DEXTROSE 2-4 GM/100ML-% IV SOLN
2.0000 g | INTRAVENOUS | Status: AC
  Administered 2018-12-23: 2 g via INTRAVENOUS
  Filled 2018-12-23: qty 100

## 2018-12-23 MED ORDER — LACTATED RINGERS IV SOLN: INTRAVENOUS | Status: DC

## 2018-12-23 MED ORDER — HYDROMORPHONE HCL 1 MG/ML IJ SOLN
0.2500 mg | INTRAMUSCULAR | Status: DC | PRN
  Administered 2018-12-23 (×3): 0.5 mg via INTRAVENOUS
  Filled 2018-12-23 (×3): qty 0.5

## 2018-12-23 MED ORDER — SODIUM CHLORIDE 0.9 % IR SOLN
Status: DC | PRN
  Administered 2018-12-23: 1000 mL

## 2018-12-23 MED ORDER — CHLORHEXIDINE GLUCONATE CLOTH 2 % EX PADS: 6.0000 | MEDICATED_PAD | Freq: Once | CUTANEOUS | Status: DC

## 2018-12-23 MED ORDER — BUPIVACAINE LIPOSOME 1.3 % IJ SUSP
INTRAMUSCULAR | Status: DC | PRN
  Administered 2018-12-23: 20 mL

## 2018-12-23 MED ORDER — FENTANYL CITRATE (PF) 250 MCG/5ML IJ SOLN
INTRAMUSCULAR | Status: AC
  Filled 2018-12-23: qty 5

## 2018-12-23 MED ORDER — KETOROLAC TROMETHAMINE 30 MG/ML IJ SOLN
15.0000 mg | Freq: Once | INTRAMUSCULAR | Status: AC
  Administered 2018-12-23: 15 mg via INTRAVENOUS
  Filled 2018-12-23: qty 1

## 2018-12-23 MED ORDER — SODIUM CHLORIDE 0.9% FLUSH
INTRAVENOUS | Status: AC
  Filled 2018-12-23: qty 10

## 2018-12-23 MED ORDER — BUPIVACAINE LIPOSOME 1.3 % IJ SUSP
INTRAMUSCULAR | Status: AC
  Filled 2018-12-23: qty 20

## 2018-12-23 MED ORDER — PROPOFOL 10 MG/ML IV BOLUS
INTRAVENOUS | Status: DC | PRN
  Administered 2018-12-23: 120 mg via INTRAVENOUS

## 2018-12-23 MED ORDER — LACTATED RINGERS IV SOLN
INTRAVENOUS | Status: DC | PRN
  Administered 2018-12-23: 07:00:00 via INTRAVENOUS

## 2018-12-23 MED ORDER — PROPOFOL 10 MG/ML IV BOLUS
INTRAVENOUS | Status: AC
  Filled 2018-12-23: qty 40

## 2018-12-23 MED ORDER — PHENYLEPHRINE HCL (PRESSORS) 10 MG/ML IV SOLN
INTRAVENOUS | Status: DC | PRN
  Administered 2018-12-23: 80 ug via INTRAVENOUS

## 2018-12-23 MED ORDER — HYDROCODONE-ACETAMINOPHEN 7.5-325 MG PO TABS
1.0000 | ORAL_TABLET | Freq: Once | ORAL | Status: AC | PRN
  Administered 2018-12-23: 1 via ORAL
  Filled 2018-12-23: qty 1

## 2018-12-23 MED ORDER — TRAMADOL HCL 50 MG PO TABS: 50.0000 mg | ORAL_TABLET | Freq: Four times a day (QID) | ORAL | 0 refills | Status: DC | PRN

## 2018-12-23 MED ORDER — GLYCOPYRROLATE 0.2 MG/ML IJ SOLN
INTRAMUSCULAR | Status: DC | PRN
  Administered 2018-12-23: .2 mg via INTRAVENOUS

## 2018-12-23 MED ORDER — FENTANYL CITRATE (PF) 100 MCG/2ML IJ SOLN
INTRAMUSCULAR | Status: DC | PRN
  Administered 2018-12-23: 50 ug via INTRAVENOUS
  Administered 2018-12-23 (×2): 25 ug via INTRAVENOUS
  Administered 2018-12-23: 50 ug via INTRAVENOUS

## 2018-12-23 MED ORDER — MIDAZOLAM HCL 2 MG/2ML IJ SOLN: 0.5000 mg | Freq: Once | INTRAMUSCULAR | Status: DC | PRN

## 2018-12-23 SURGICAL SUPPLY — 35 items
CLOTH BEACON ORANGE TIMEOUT ST (SAFETY) ×2 IMPLANT
COVER LIGHT HANDLE STERIS (MISCELLANEOUS) ×4 IMPLANT
COVER WAND RF STERILE (DRAPES) ×1 IMPLANT
DERMABOND ADVANCED (GAUZE/BANDAGES/DRESSINGS) ×1
DERMABOND ADVANCED .7 DNX12 (GAUZE/BANDAGES/DRESSINGS) ×1 IMPLANT
DRAIN PENROSE 18X1/2 LTX STRL (DRAIN) ×2 IMPLANT
ELECT REM PT RETURN 9FT ADLT (ELECTROSURGICAL) ×2
ELECTRODE REM PT RTRN 9FT ADLT (ELECTROSURGICAL) ×1 IMPLANT
GAUZE SPONGE 4X4 12PLY STRL (GAUZE/BANDAGES/DRESSINGS) ×2 IMPLANT
GLOVE BIO SURGEON STRL SZ7 (GLOVE) ×2 IMPLANT
GLOVE BIOGEL PI IND STRL 7.0 (GLOVE) ×2 IMPLANT
GLOVE BIOGEL PI INDICATOR 7.0 (GLOVE) ×3
GLOVE SURG SS PI 7.5 STRL IVOR (GLOVE) ×2 IMPLANT
GOWN STRL REUS W/TWL LRG LVL3 (GOWN DISPOSABLE) ×6 IMPLANT
INST SET MINOR GENERAL (KITS) ×2 IMPLANT
KIT TURNOVER KIT A (KITS) ×2 IMPLANT
MANIFOLD NEPTUNE II (INSTRUMENTS) ×2 IMPLANT
MESH HERNIA 1.6X1.9 PLUG LRG (Mesh General) IMPLANT
MESH HERNIA PLUG LRG (Mesh General) ×1 IMPLANT
NDL HYPO 18GX1.5 BLUNT FILL (NEEDLE) IMPLANT
NDL HYPO 21X1.5 SAFETY (NEEDLE) ×1 IMPLANT
NEEDLE HYPO 18GX1.5 BLUNT FILL (NEEDLE) ×2 IMPLANT
NEEDLE HYPO 21X1.5 SAFETY (NEEDLE) ×2 IMPLANT
NS IRRIG 1000ML POUR BTL (IV SOLUTION) ×2 IMPLANT
PACK MINOR (CUSTOM PROCEDURE TRAY) ×2 IMPLANT
PAD ARMBOARD 7.5X6 YLW CONV (MISCELLANEOUS) ×2 IMPLANT
SET BASIN LINEN APH (SET/KITS/TRAYS/PACK) ×2 IMPLANT
SUT MNCRL AB 4-0 PS2 18 (SUTURE) ×2 IMPLANT
SUT NOVA NAB GS-22 2 2-0 T-19 (SUTURE) ×5 IMPLANT
SUT VIC AB 2-0 CT1 27 (SUTURE) ×1
SUT VIC AB 2-0 CT1 TAPERPNT 27 (SUTURE) ×1 IMPLANT
SUT VIC AB 3-0 SH 27 (SUTURE) ×1
SUT VIC AB 3-0 SH 27X BRD (SUTURE) ×1 IMPLANT
SYR 20CC LL (SYRINGE) ×2 IMPLANT
SYR 30ML LL (SYRINGE) ×1 IMPLANT

## 2018-12-23 NOTE — Transfer of Care (Signed)
Immediate Anesthesia Transfer of Care Note  Patient: METE PURDUM  Procedure(s) Performed: RECURRENT RIGHT INGUINAL HERNIA  REPAIR  WITH MESH (Right )  Patient Location: PACU  Anesthesia Type:General  Level of Consciousness: awake and alert   Airway & Oxygen Therapy: Patient Spontanous Breathing and Patient connected to face mask oxygen  Post-op Assessment: Report given to RN  Post vital signs: Reviewed and stable  Last Vitals:  Vitals Value Taken Time  BP 115/71 12/23/18 0845  Temp    Pulse 70 12/23/18 0847  Resp 12 12/23/18 0847  SpO2 100 % 12/23/18 0847  Vitals shown include unvalidated device data.  Last Pain:  Vitals:   12/23/18 0647  TempSrc: Oral  PainSc:       Patients Stated Pain Goal: 5 (76/72/09 4709)  Complications: No apparent anesthesia complications

## 2018-12-23 NOTE — Interval H&P Note (Signed)
History and Physical Interval Note:  12/23/2018 7:18 AM  Charles Golden  has presented today for surgery, with the diagnosis of inguinal hernia.  The various methods of treatment have been discussed with the patient and family. After consideration of risks, benefits and other options for treatment, the patient has consented to  Procedure(s): HERNIA REPAIR INGUINAL ADULT WITH MESH (Right) as a surgical intervention.  [name] history has been reviewed, patient examined, no change in status, stable for surgery.  I have reviewed the patient's chart and labs.  Questions were answered to the patient's satisfaction.     Aviva Signs

## 2018-12-23 NOTE — Anesthesia Procedure Notes (Signed)
Procedure Name: LMA Insertion Date/Time: 12/23/2018 7:38 AM Performed by: Ollen Bowl, CRNA Pre-anesthesia Checklist: Patient identified, Patient being monitored, Emergency Drugs available, Timeout performed and Suction available Patient Re-evaluated:Patient Re-evaluated prior to induction Oxygen Delivery Method: Circle System Utilized Preoxygenation: Pre-oxygenation with 100% oxygen Induction Type: IV induction Ventilation: Mask ventilation without difficulty LMA: LMA inserted LMA Size: 4.0 Number of attempts: 1 Placement Confirmation: positive ETCO2 and breath sounds checked- equal and bilateral

## 2018-12-23 NOTE — Op Note (Signed)
Patient:  Charles Golden  DOB:  04/07/1926  MRN:  [id number]   Preop Diagnosis: Recurrent right inguinal hernia  Postop Diagnosis: Same  Procedure: Recurrent right inguinal herniorrhaphy with mesh  Surgeon: Aviva Signs, MD  Anes: General  Indications: Patient is a 85 year old white male who presents with a recurrent right inguinal hernia.  The risks and benefits of the procedure including bleeding, infection, mesh use, the possibility of recurrence of the hernia were fully explained to the patient, who gave informed consent.  Procedure note: The patient was placed in supine position.  After general anesthesia was administered, the right groin region was prepped and draped using the usual sterile technique with Betadine.  Surgical site confirmation was performed.  A right groin incision was made through the previous surgical incision site down to the external oblique aponeurosis.  The aponeurosis was incised to the external ring.  A Penrose drain was placed around the spermatic cord.  The patient was noted to have a large indirect hernia.  This was freed away from the spermatic cord up to the peritoneal reflection and inverted.  A large Bard PerFix plug was then inserted.  An onlay patch was placed along the floor of the inguinal canal and secured superiorly to the conjoined tendon and inferiorly to the shelving edge of Poupart's ligament using 2-0 Novafil interrupted sutures.  The internal ring was re-created using a 2-0 Novafil interrupted suture.  The external oblique aponeurosis was reapproximated using a 2-0 Vicryl running suture.  The subcutaneous layer was reapproximated using a 3-0 Vicryl interrupted suture.  The skin was closed using a 4-0 Monocryl subcuticular suture.  Exparel was instilled into the surrounding wound.  The incision was covered with Dermabond.  All tape and needle counts were correct at the end of the procedure.  The patient was awakened and transferred to PACU in stable  condition.  Complications: None  EBL: Minimal  Specimen: None

## 2018-12-23 NOTE — Anesthesia Preprocedure Evaluation (Signed)
Anesthesia Evaluation  Patient identified by MRN, date of birth, ID band Patient awake    Reviewed: Allergy & Precautions, NPO status , Patient's Chart, lab work & pertinent test results  Airway Mallampati: II  TM Distance: >3 FB Neck ROM: Full    Dental no notable dental hx. (+) Partial Upper, Missing   Pulmonary COPD,  COPD inhaler, former smoker,    Pulmonary exam normal breath sounds clear to auscultation       Cardiovascular Exercise Tolerance: Good negative cardio ROS Normal cardiovascular examI Rhythm:Regular Rate:Normal     Neuro/Psych  Headaches,  Neuromuscular disease negative psych ROS   GI/Hepatic Neg liver ROS, GERD  Controlled,Denied GERD when questions -no current GERD meds    Endo/Other  negative endocrine ROS  Renal/GU negative Renal ROS  negative genitourinary   Musculoskeletal negative musculoskeletal ROS (+)   Abdominal   Peds negative pediatric ROS (+)  Hematology negative hematology ROS (+)   Anesthesia Other Findings   Reproductive/Obstetrics negative OB ROS                             Anesthesia Physical Anesthesia Plan  ASA: II  Anesthesia Plan: General   Post-op Pain Management:    Induction: Intravenous  PONV Risk Score and Plan: 2 and Ondansetron  Airway Management Planned: LMA  Additional Equipment:   Intra-op Plan:   Post-operative Plan: Extubation in OR  Informed Consent: I have reviewed the patients History and Physical, chart, labs and discussed the procedure including the risks, benefits and alternatives for the proposed anesthesia with the patient or authorized representative who has indicated his/her understanding and acceptance.     Dental advisory given  Plan Discussed with: CRNA  Anesthesia Plan Comments: (Plan Full PPE  Plan GA (LMA) /GETA as needed )        Anesthesia Quick Evaluation

## 2018-12-23 NOTE — Anesthesia Postprocedure Evaluation (Signed)
Anesthesia Post Note  Patient: Charles Golden  Procedure(s) Performed: RECURRENT RIGHT INGUINAL HERNIA  REPAIR  WITH MESH (Right )  Patient location during evaluation: PACU Anesthesia Type: General Level of consciousness: awake and alert and oriented Pain management: pain level controlled Vital Signs Assessment: post-procedure vital signs reviewed and stable Respiratory status: spontaneous breathing Cardiovascular status: blood pressure returned to baseline and stable Postop Assessment: no apparent nausea or vomiting Anesthetic complications: no     Last Vitals:  Vitals:   12/23/18 0930 12/23/18 0954  BP:  (!) 142/57  Pulse: (!) 58 (!) 55  Resp: 15 16  Temp:  (!) 36.4 C  SpO2: 93% 99%    Last Pain:  Vitals:   12/23/18 1026  TempSrc:   PainSc: 6                  Anamari Galeas

## 2018-12-23 NOTE — Discharge Instructions (Signed)
Open Hernia Repair, Adult, Care After This sheet gives you information about how to care for yourself after your procedure. Your health care provider may also give you more specific instructions. If you have problems or questions, contact your health care provider. What can I expect after the procedure? After the procedure, it is common to have:  Mild discomfort.  Slight bruising.  Minor swelling.  Pain in the abdomen. Follow these instructions at home: Incision care   Follow instructions from your health care provider about how to take care of your incision area. Make sure you: ? Wash your hands with soap and water before you change your bandage (dressing). If soap and water are not available, use hand sanitizer. ? Change your dressing as told by your health care provider. ? Leave stitches (sutures), skin glue, or adhesive strips in place. These skin closures may need to stay in place for 2 weeks or longer. If adhesive strip edges start to loosen and curl up, you may trim the loose edges. Do not remove adhesive strips completely unless your health care provider tells you to do that.  Check your incision area every day for signs of infection. Check for: ? More redness, swelling, or pain. ? More fluid or blood. ? Warmth. ? Pus or a bad smell. Activity  Do not drive or use heavy machinery while taking prescription pain medicine. Do not drive until your health care provider approves.  Until your health care provider approves: ? Do not lift anything that is heavier than 10 lb (4.5 kg). ? Do not play contact sports.  Return to your normal activities as told by your health care provider. Ask your health care provider what activities are safe. General instructions  To prevent or treat constipation while you are taking prescription pain medicine, your health care provider may recommend that you: ? Drink enough fluid to keep your urine clear or pale yellow. ? Take over-the-counter or  prescription medicines. ? Eat foods that are high in fiber, such as fresh fruits and vegetables, whole grains, and beans. ? Limit foods that are high in fat and processed sugars, such as fried and sweet foods.  Take over-the-counter and prescription medicines only as told by your health care provider.  Do not take tub baths or go swimming until your health care provider approves.  Keep all follow-up visits as told by your health care provider. This is important. Contact a health care provider if:  You develop a rash.  You have more redness, swelling, or pain around your incision.  You have more fluid or blood coming from your incision.  Your incision feels warm to the touch.  You have pus or a bad smell coming from your incision.  You have a fever or chills.  You have blood in your stool (feces).  You have not had a bowel movement in 2-3 days.  Your pain is not controlled with medicine. Get help right away if:  You have chest pain or shortness of breath.  You feel light-headed or feel faint.  You have severe pain.  You vomit and your pain is worse. This information is not intended to replace advice given to you by your health care provider. Make sure you discuss any questions you have with your health care provider. Document Released: 12/01/2004 Document Revised: 04/26/2017 Document Reviewed: 10/26/2015 Elsevier Patient Education  2020 [employer].

## 2018-12-24 ENCOUNTER — Encounter (HOSPITAL_COMMUNITY): Payer: Self-pay | Admitting: General Surgery

## 2019-01-06 ENCOUNTER — Telehealth (INDEPENDENT_AMBULATORY_CARE_PROVIDER_SITE_OTHER): Payer: Self-pay | Admitting: General Surgery

## 2019-01-06 ENCOUNTER — Other Ambulatory Visit: Payer: Self-pay

## 2019-01-06 DIAGNOSIS — Z09 Encounter for follow-up examination after completed treatment for conditions other than malignant neoplasm: Secondary | ICD-10-CM

## 2019-01-06 NOTE — Telephone Encounter (Signed)
Phone call visit performed.  Having some incisional burning, mild.  No constipation.  Pain decreasing.  Ambulating ok.  No fevers, chills.  Will return in several weeks if he wants me to check his incision.  Overall, is pleased with results.

## 2019-01-13 ENCOUNTER — Telehealth: Payer: Self-pay | Admitting: Pulmonary Disease

## 2019-01-13 DIAGNOSIS — J471 Bronchiectasis with (acute) exacerbation: Secondary | ICD-10-CM

## 2019-01-13 DIAGNOSIS — J449 Chronic obstructive pulmonary disease, unspecified: Secondary | ICD-10-CM

## 2019-01-13 MED ORDER — DOXYCYCLINE HYCLATE 100 MG PO TABS: 100.0000 mg | ORAL_TABLET | Freq: Two times a day (BID) | ORAL | 0 refills | Status: DC

## 2019-01-13 NOTE — Telephone Encounter (Signed)
Called and spoke with pt letting him know the info stated by [facility] and that she sent an abx to pharmacy for him. Stated to him that he needs to resume neb sol and flutter valve and pt verbalized understanding. Nothing further needed.

## 2019-01-13 NOTE — Telephone Encounter (Signed)
Beth can we send an Rx for flutter valve and nebulizer supplies? BQ is not available.

## 2019-01-13 NOTE — Telephone Encounter (Signed)
Primary Pulmonologist: BQ Last office visit and with whom: 06/03/2018 with [facility] What do we see them for (pulmonary problems): bronchiectasis Last OV assessment/plan: Instructions    Return in about 6 months (around 12/02/2018). Bronchiectasis: Make sure you clear out mucus twice a day: Hypertonic saline and albuterol nebulized twice a day and using the flutter valve is helpful Practice good hand hygiene Stay active Call me if you have bronchitis or worsening chest symptoms  We will see you back in 6 months or sooner if needed     Was appointment offered to patient (explain)?  Pt wants recommendations   Reason for call: called and spoke with pt who stated he has had increased fatigue, sob, and cough with clear to yellow phlegm which he said has become worse over the last 4-5 months.  Asked pt if he has used his nebulizer and he said he has not used it in a long while and pt also has not been using the flutter valve.  Pt denies any complaints of fever. Asked pt if he had any complaints of body aches or chills and he did not say that he did but states he just does not feel good and not like himself. Pt said with the fatigue and SOB, he can walk about 15 feet and then has to stop and sit down.  Beth, please advise on recs for pt. Thanks!

## 2019-01-13 NOTE — Telephone Encounter (Signed)
I will send in an abx from suspected bronchiectasis flare. He needs to resume hypertonic saline and albuterol nebulizer twice daily and use flutter valve after. If he has any possible exposure to covid recommend getting tested.

## 2019-01-13 NOTE — Telephone Encounter (Signed)
Yes, thank you.

## 2019-01-21 ENCOUNTER — Other Ambulatory Visit: Payer: Self-pay

## 2019-01-21 ENCOUNTER — Ambulatory Visit (INDEPENDENT_AMBULATORY_CARE_PROVIDER_SITE_OTHER): Payer: Medicare Other | Admitting: Family Medicine

## 2019-01-21 ENCOUNTER — Encounter: Payer: Self-pay | Admitting: Family Medicine

## 2019-01-21 VITALS — BP 125/74 | HR 83 | Temp 97.5°F | Ht 70.5 in | Wt 171.0 lb

## 2019-01-21 DIAGNOSIS — R5383 Other fatigue: Secondary | ICD-10-CM

## 2019-01-21 DIAGNOSIS — K582 Mixed irritable bowel syndrome: Secondary | ICD-10-CM

## 2019-01-21 DIAGNOSIS — J441 Chronic obstructive pulmonary disease with (acute) exacerbation: Secondary | ICD-10-CM | POA: Diagnosis not present

## 2019-01-21 DIAGNOSIS — R0609 Other forms of dyspnea: Secondary | ICD-10-CM

## 2019-01-21 DIAGNOSIS — W57XXXD Bitten or stung by nonvenomous insect and other nonvenomous arthropods, subsequent encounter: Secondary | ICD-10-CM | POA: Diagnosis not present

## 2019-01-21 DIAGNOSIS — I700 Atherosclerosis of aorta: Secondary | ICD-10-CM | POA: Diagnosis not present

## 2019-01-21 DIAGNOSIS — K219 Gastro-esophageal reflux disease without esophagitis: Secondary | ICD-10-CM | POA: Diagnosis not present

## 2019-01-21 DIAGNOSIS — E559 Vitamin D deficiency, unspecified: Secondary | ICD-10-CM | POA: Diagnosis not present

## 2019-01-21 DIAGNOSIS — R5381 Other malaise: Secondary | ICD-10-CM | POA: Insufficient documentation

## 2019-01-21 DIAGNOSIS — R531 Weakness: Secondary | ICD-10-CM

## 2019-01-21 MED ORDER — PREDNISONE 20 MG PO TABS: ORAL_TABLET | ORAL | 0 refills | Status: DC

## 2019-01-21 NOTE — Patient Instructions (Signed)
Chronic Obstructive Pulmonary Disease °Chronic obstructive pulmonary disease (COPD) is a long-term (chronic) lung problem. When you have COPD, it is hard for air to get in and out of your lungs. Usually the condition gets worse over time, and your lungs will never return to normal. There are things you can do to keep yourself as healthy as possible. °· Your doctor may treat your condition with: °? Medicines. °? Oxygen. °? Lung surgery. °· Your doctor may also recommend: °? Rehabilitation. This includes steps to make your body work better. It may involve a team of specialists. °? Quitting smoking, if you smoke. °? Exercise and changes to your diet. °? Comfort measures (palliative care). °Follow these instructions at home: °Medicines °· Take over-the-counter and prescription medicines only as told by your doctor. °· Talk to your doctor before taking any cough or allergy medicines. You may need to avoid medicines that cause your lungs to be dry. °Lifestyle °· If you smoke, stop. Smoking makes the problem worse. If you need help quitting, ask your doctor. °· Avoid being around things that make your breathing worse. This may include smoke, chemicals, and fumes. °· Stay active, but remember to rest as well. °· Learn and use tips on how to relax. °· Make sure you get enough sleep. Most adults need at least 7 hours of sleep every night. °· Eat healthy foods. Eat smaller meals more often. Rest before meals. °Controlled breathing °Learn and use tips on how to control your breathing as told by your doctor. Try: °· Breathing in (inhaling) through your nose for 1 second. Then, pucker your lips and breath out (exhale) through your lips for 2 seconds. °· Putting one hand on your belly (abdomen). Breathe in slowly through your nose for 1 second. Your hand on your belly should move out. Pucker your lips and breathe out slowly through your lips. Your hand on your belly should move in as you breathe out. ° °Controlled coughing °Learn  and use controlled coughing to clear mucus from your lungs. Follow these steps: °1. Lean your head a little forward. °2. Breathe in deeply. °3. Try to hold your breath for 3 seconds. °4. Keep your mouth slightly open while coughing 2 times. °5. Spit any mucus out into a tissue. °6. Rest and do the steps again 1 or 2 times as needed. °General instructions °· Make sure you get all the shots (vaccines) that your doctor recommends. Ask your doctor about a flu shot and a pneumonia shot. °· Use oxygen therapy and pulmonary rehabilitation if told by your doctor. If you need home oxygen therapy, ask your doctor if you should buy a tool to measure your oxygen level (oximeter). °· Make a COPD action plan with your doctor. This helps you to know what to do if you feel worse than usual. °· Manage any other conditions you have as told by your doctor. °· Avoid going outside when it is very hot, cold, or humid. °· Avoid people who have a sickness you can catch (contagious). °· Keep all follow-up visits as told by your doctor. This is important. °Contact a doctor if: °· You cough up more mucus than usual. °· There is a change in the color or thickness of the mucus. °· It is harder to breathe than usual. °· Your breathing is faster than usual. °· You have trouble sleeping. °· You need to use your medicines more often than usual. °· You have trouble doing your normal activities such as getting dressed   or walking around the house. °Get help right away if: °· You have shortness of breath while resting. °· You have shortness of breath that stops you from: °? Being able to talk. °? Doing normal activities. °· Your chest hurts for longer than 5 minutes. °· Your skin color is more blue than usual. °· Your pulse oximeter shows that you have low oxygen for longer than 5 minutes. °· You have a fever. °· You feel too tired to breathe normally. °Summary °· Chronic obstructive pulmonary disease (COPD) is a long-term lung problem. °· The way your  lungs work will never return to normal. Usually the condition gets worse over time. There are things you can do to keep yourself as healthy as possible. °· Take over-the-counter and prescription medicines only as told by your doctor. °· If you smoke, stop. Smoking makes the problem worse. °This information is not intended to replace advice given to you by your health care provider. Make sure you discuss any questions you have with your health care provider. °Document Released: 10/31/2007 Document Revised: 04/26/2017 Document Reviewed: 06/18/2016 °[employer] Patient Education © 2020 [employer] ° °

## 2019-01-22 LAB — BRAIN NATRIURETIC PEPTIDE: BNP: 80.1 pg/mL (ref 0.0–100.0)

## 2019-01-22 NOTE — Progress Notes (Signed)
Subjective:  Patient ID: Charles Golden, male    DOB: [birth year], 85 y.o.   MRN: [id number]  Patient Care Team: Baruch Gouty, FNP as PCP - General (Family Medicine) Irine Seal, MD (Urology) Richmond Campbell, MD (Gastroenterology) Clent Jacks, MD (Ophthalmology) Gaynelle Arabian, MD as Consulting Physician (Orthopedic Surgery) Jodi Marble, MD as Consulting Physician (Otolaryngology) Juanito Doom, MD as Consulting Physician (Pulmonary Disease) Kathrynn Ducking, MD as Consulting Physician (Neurology)   Chief Complaint:  Medical Management of Chronic Issues, COPD, Gastroesophageal Reflux, and Extremity Weakness   HPI: Charles Golden is a 85 y.o. male presenting on 01/21/2019 for Medical Management of Chronic Issues, COPD, Gastroesophageal Reflux, and Extremity Weakness   1. Thoracic aortic atherosclerosis ([place])  Not on ASA or statin therapy. Is as active as he can be daily and does watch what he eats.    2. Gastroesophageal reflux disease without esophagitis  Diet controlled.  Cough - Yes Sore throat - No Voice change - No Hemoptysis - No Dysphagia or dyspepsia - No Water brash - No Red Flags (weight loss, hematochezia, melena, weight loss, early satiety, fevers, odynophagia, or persistent vomiting) - No   3. COPD with exacerbation ([place])  Ongoing exertional shortness, of breath, increased thick sputum production, fatigue, and cough. Pt was to start antibiotics prescribed by Dr. Lake Bells but did not. Wanted to talk with PCP first. Pt reports the symptoms are getting worse. No chest pain or fever. Thick yellowish-brown sputum production. Has tried Mucinex with some relief. Is not using inhalers or nebulizer treatments as discussed with the pulmonologist. Pt states he was unsure if this was necessary. Long discussion about disease process and need for proper use of prescribed medications to help with symptoms and to prevent exacerbations. Pt states he will start taking  medications as prescribed.    4. Vitamin D deficiency  Pt is taking oral repletion therapy. Denies bone pain and tenderness, muscle weakness, fracture, and difficulty walking. Lab Results  Component Value Date   VD25OH 54.7 01/21/2019   VD25OH 58.0 08/05/2018   VD25OH 56.4 03/28/2018   Lab Results  Component Value Date   CALCIUM 9.1 01/21/2019      5. Weakness   6. Other fatigue   7. Tick bite, subsequent encounter   8. DOE (dyspnea on exertion)  Pt reports increasing fatigue. States he can take 2-3 naps per day. Pt states this has been getting worse over the last 1-2 years. States he feels worn out all of the time but wants to be able to continue to do what he used to do. Pt states he has generalized weakness that seems to be progressing. No focal deficits or confusion. States he can walk 50-100 feet and have to stop to rest due to shortness of breath and fatigue. No chest pain, dizziness, or syncope. Does feel the DOE is increasing over the last several weeks. No fever or chills. He denies changes in weight. No orthopnea or PND. Feels he should not feel this way and would like answers. Pt aware these things tend to come with age due to deconditioning of the body. Pt feels this is not the cause. Pt states his did have a tick bite on his abdomen a while back. States he has had Lyme Disease in the past and wonders if this could be the cause. States he does not recall a rash but unsure if he had one. No rash at present. Does have occasional joint aches and  pains. No confusion, fever, chills, or n/v.    9. Irritable bowel syndrome with both constipation and diarrhea  Ongoing for several years. Does not take medications. No rectal bleeding, nausea, or vomiting. States his bowel movements balance themselves out.      Relevant past medical, surgical, family, and social history reviewed and updated as indicated.  Allergies and medications reviewed and updated. Date reviewed: Chart in  [facility].   Past Medical History:  Diagnosis Date   Bronchitis, chronic (HCC)    COPD (chronic obstructive pulmonary disease) (HCC)    Enlarged prostate    GERD (gastroesophageal reflux disease)    Glaucoma    Glucagonoma    Hernia, incisional    at present   [facility] (hard of hearing)    IBS (irritable bowel syndrome)    Sciatic pain    Sinus congestion     Past Surgical History:  Procedure Laterality Date   BOWEL RESECTION N/A 09/04/2012   Procedure: SMALL BOWEL RESECTION;  Surgeon: Donato Heinz, MD;  Location: [facility];  Service: General;  Laterality: N/A;  Anastimosis   HEMORRHOID SURGERY     HERNIA REPAIR Bilateral 70's   INGUINAL HERNIA REPAIR Left 09/14/2013   Procedure: LEFT INGUINAL HERNIORRHAPHY;  Surgeon: Jamesetta So, MD;  Location: [facility];  Service: General;  Laterality: Left;   INGUINAL HERNIA REPAIR Right 12/23/2018   Procedure: RECURRENT RIGHT INGUINAL HERNIA  REPAIR  WITH MESH;  Surgeon: Aviva Signs, MD;  Location: [facility];  Service: General;  Laterality: Right;   INSERTION OF MESH Left 09/14/2013   Procedure: INSERTION OF MESH;  Surgeon: Jamesetta So, MD;  Location: [facility];  Service: General;  Laterality: Left;   KNEE ARTHROSCOPY Left 07/21/2014   Procedure: LEFT KNEE ARTHROSCOPY WITH MEDIAL MENISCAL DEBRIDEMENT ;  Surgeon: Gearlean Alf, MD;  Location: [facility];  Service: Orthopedics;  Laterality: Left;   LAPAROTOMY N/A 09/04/2012   Procedure: EXPLORATORY LAPAROTOMY;  Surgeon: Donato Heinz, MD;  Location: [facility];  Service: General;  Laterality: N/A;   SKIN LESION EXCISION     [name]   TONSILLECTOMY      Social History   Socioeconomic History   Marital status: Married    Spouse name: [name]   Number of children: 3   Years of education: 12+   Highest education level: Bachelor's degree (e.g., BA, AB, BS)  Occupational History   Occupation: Retired    Comment: Optometrist strain: Not  hard at [employer]    Worry: Never true    Inability: Never true   Transportation needs    Medical: No    Non-medical: No  Tobacco Use   Smoking status: Former Smoker    Packs/day: 1.00    Years: 30.00    Pack years: 30.00    Types: Cigarettes    Quit date: 05/28/2006    Years since quitting: 12.6   Smokeless tobacco: Never Used  Substance and Sexual Activity   Alcohol use: Not Currently    Alcohol/week: 0.0 standard drinks   Drug use: No   Sexual activity: Yes    Birth control/protection: None  Lifestyle   Physical activity    Days per week: 0 days    Minutes per session: 0 min   Stress: Not at all  Relationships   Social connections    Talks on phone: More than three times a week    Gets together:  More than three times a week    Attends religious service: More than 4 times per year    Active member of club or organization: Yes    Attends meetings of clubs or organizations: More than 4 times per year    Relationship status: Married   Intimate partner violence    Fear of current or ex partner: No    Emotionally abused: No    Physically abused: No    Forced sexual activity: No  Other Topics Concern   Not on file  Social History Narrative   Patient lives at home with his wife [place].    Patient has 3 children.    Patient has his BS   Patient is retired.    Drinks about 2 cups of coffee per day.      [facility]:   He is still married. 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 He is from [place].        Outpatient Encounter Medications as of 01/21/2019  Medication Sig   Azelastine HCl 137 MCG/SPRAY SOLN PLACE 1 SPRAY INTO THE NOSE 1 DAY OR 1 DOSE. USE IN EACH NOSTRIL AS DIRECTED   Cholecalciferol (VITAMIN D3) 2000 UNITS TABS Take 1 tablet by mouth daily.   Cyanocobalamin (VITAMIN B-12) 5000 MCG SUBL Place 1 tablet under the tongue daily.   doxazosin (CARDURA)  8 MG tablet Take 0.5-1 tablets (4-8 mg total) by mouth daily.   finasteride (PROSCAR) 5 MG tablet Take 1 tablet (5 mg total) by mouth daily.   fluticasone (FLONASE) 50 MCG/ACT nasal spray 1 SPRAY IN EACH NOSTRIL ONCE A DAY   guaiFENesin (MUCINEX) 600 MG 12 hr tablet Take 600 mg by mouth as needed. Reported on 05/25/2015   latanoprost (XALATAN) 0.005 % ophthalmic solution Place 1 drop into the left eye at bedtime.   Respiratory Therapy Supplies (FLUTTER) DEVI 1 Device by Other route as needed.   albuterol (PROVENTIL) (2.5 MG/3ML) 0.083% nebulizer solution Take 3 mLs (2.5 mg total) by nebulization every 6 (six) hours as needed for wheezing or shortness of breath. (Patient not taking: Reported on 01/21/2019)   predniSONE (DELTASONE) 20 MG tablet 2 po at sametime daily for 5 days   sodium chloride HYPERTONIC 3 % nebulizer solution Take by nebulization 2 (two) times daily. (Patient not taking: Reported on 01/21/2019)   traMADol (ULTRAM) 50 MG tablet Take 1 tablet (50 mg total) by mouth every 6 (six) hours as needed. (Patient not taking: Reported on 01/21/2019)   [DISCONTINUED] budesonide (ENTOCORT EC) 3 MG 24 hr capsule Take by mouth.   [DISCONTINUED] doxycycline (VIBRA-TABS) 100 MG tablet Take 1 tablet (100 mg total) by mouth 2 (two) times daily.   No facility-administered encounter medications on file as of 01/21/2019.     Allergies  Allergen Reactions   Demerol [Meperidine] Other (See Comments)    Pt. States "he woke up during a colonoscopy"   Spiriva Handihaler [Tiotropium Bromide Monohydrate] Other (See Comments)    Mild Urinary Retention    Review of Systems  Constitutional: Positive for fatigue. Negative for activity change, appetite change, chills, diaphoresis, fever and unexpected weight change.  HENT: Positive for hearing loss.   Eyes: Negative.  Negative for photophobia and visual disturbance.  Respiratory: Positive for cough, shortness of breath (DOE) and wheezing.  Negative for chest tightness.   Cardiovascular: Negative for chest pain, palpitations and leg swelling.  Gastrointestinal: Positive for constipation and diarrhea.  Negative for abdominal distention, abdominal pain, anal bleeding, blood in stool, nausea, rectal pain and vomiting.  Endocrine: Negative.  Negative for cold intolerance, heat intolerance, polydipsia, polyphagia and polyuria.  Genitourinary: Positive for difficulty urinating (BPH). Negative for decreased urine volume, discharge, dysuria, frequency, hematuria, penile pain, penile swelling, scrotal swelling, testicular pain and urgency.  Musculoskeletal: Negative for arthralgias and myalgias.  Skin: Negative.  Negative for color change and pallor.  Allergic/Immunologic: Negative.   Neurological: Positive for weakness (generalized). Negative for dizziness, tremors, seizures, syncope, facial asymmetry, speech difficulty, light-headedness, numbness and headaches.  Hematological: Negative.   Psychiatric/Behavioral: Negative for agitation, confusion, hallucinations, sleep disturbance and suicidal ideas.  All other systems reviewed and are negative.       Objective:  BP 125/74    Pulse 83    Temp (!) 97.5 F (36.4 C)    Ht 5' 10.5" (1.791 m)    Wt 171 lb (77.6 kg)    SpO2 96%    BMI 24.19 kg/m    Wt Readings from Last 3 Encounters:  01/21/19 171 lb (77.6 kg)  12/23/18 173 lb 1 oz (78.5 kg)  12/18/18 173 lb 1 oz (78.5 kg)    Physical Exam Vitals signs and nursing note reviewed.  Constitutional:      General: He is not in acute distress.    Appearance: Normal appearance. He is well-developed, well-groomed and normal weight. He is not ill-appearing, toxic-appearing or diaphoretic.  HENT:     Head: Normocephalic and atraumatic.     Jaw: There is normal jaw occlusion.     Right Ear: Decreased hearing noted.     Left Ear: Decreased hearing noted.     Nose: Nose normal.     Mouth/Throat:     Lips: Pink.     Mouth: Mucous membranes  are moist.     Pharynx: Oropharynx is clear. Uvula midline.  Eyes:     General: Lids are normal.     Extraocular Movements: Extraocular movements intact.     Conjunctiva/sclera: Conjunctivae normal.     Pupils: Pupils are equal, round, and reactive to light.  Neck:     Musculoskeletal: Normal range of motion and neck supple.     Thyroid: No thyroid mass, thyromegaly or thyroid tenderness.     Vascular: No carotid bruit or JVD.     Trachea: Trachea and phonation normal.  Cardiovascular:     Rate and Rhythm: Normal rate and regular rhythm.     Chest Wall: PMI is not displaced.     Pulses: Normal pulses.     Heart sounds: Normal heart sounds. No murmur. No friction rub. No gallop.   Pulmonary:     Effort: Pulmonary effort is normal. No respiratory distress.     Breath sounds: Examination of the right-lower field reveals wheezing. Examination of the left-lower field reveals wheezing. Wheezing present.  Abdominal:     General: Bowel sounds are normal. There is no distension or abdominal bruit.     Palpations: Abdomen is soft. There is no hepatomegaly or splenomegaly.     Tenderness: There is no abdominal tenderness. There is no right CVA tenderness or left CVA tenderness.     Hernia: A hernia is present. Hernia is present in the right inguinal area (recent repair, healed well, no tenderness or erythema).     Comments: Midline incisional hernia from previous surgery. No tenderness. Soft and easily reduced.  Musculoskeletal: Normal range of motion.     Right lower leg: No  edema.     Left lower leg: No edema.  Lymphadenopathy:     Cervical: No cervical adenopathy.  Skin:    General: Skin is warm and dry.     Capillary Refill: Capillary refill takes less than 2 seconds.     Coloration: Skin is not cyanotic, jaundiced or pale.     Findings: No rash.  Neurological:     General: No focal deficit present.     Mental Status: He is alert and oriented to person, place, and time.     Cranial  Nerves: Cranial nerves are intact. No cranial nerve deficit.     Sensory: Sensation is intact. No sensory deficit.     Motor: Motor function is intact. No weakness.     Coordination: Coordination is intact. Coordination normal.     Gait: Gait is intact. Gait normal.     Deep Tendon Reflexes: Reflexes are normal and symmetric. Reflexes normal.  Psychiatric:        Attention and Perception: Attention and perception normal.        Mood and Affect: Mood and affect normal.        Speech: Speech normal.        Behavior: Behavior normal. Behavior is cooperative.        Thought Content: Thought content normal.        Cognition and Memory: Cognition and memory normal.        Judgment: Judgment normal.     Results for orders placed or performed in visit on 01/21/19  CMP14+EGFR  Result Value Ref Range   Glucose 106 (H) 65 - 99 mg/dL   BUN 23 10 - 36 mg/dL   Creatinine, Ser 1.53 (H) 0.76 - 1.27 mg/dL   GFR calc non Af Amer 39 (L) >59 mL/min/1.73   GFR calc Af Amer 45 (L) >59 mL/min/1.73   BUN/Creatinine Ratio 15 10 - 24   Sodium 142 134 - 144 mmol/L   Potassium 5.1 3.5 - 5.2 mmol/L   Chloride 104 96 - 106 mmol/L   CO2 23 20 - 29 mmol/L   Calcium 9.1 8.6 - 10.2 mg/dL   Total Protein 6.1 6.0 - 8.5 g/dL   Albumin 4.0 3.5 - 4.6 g/dL   Globulin, Total 2.1 1.5 - 4.5 g/dL   Albumin/Globulin Ratio 1.9 1.2 - 2.2   Bilirubin Total 0.2 0.0 - 1.2 mg/dL   Alkaline Phosphatase 81 39 - 117 IU/L   AST 17 0 - 40 IU/L   ALT 9 0 - 44 IU/L  CBC with Differential/Platelet  Result Value Ref Range   WBC 7.6 3.4 - 10.8 x10E3/uL   RBC 4.56 4.14 - 5.80 x10E6/uL   Hemoglobin 13.6 13.0 - 17.7 g/dL   Hematocrit 41.9 37.5 - 51.0 %   MCV 92 79 - 97 fL   MCH 29.8 26.6 - 33.0 pg   MCHC 32.5 31.5 - 35.7 g/dL   RDW 12.3 11.6 - 15.4 %   Platelets 250 150 - 450 x10E3/uL   Neutrophils 61 Not Estab. %   Lymphs 26 Not Estab. %   Monocytes 8 Not Estab. %   Eos 4 Not Estab. %   Basos 1 Not Estab. %   Neutrophils  Absolute 4.7 1.4 - 7.0 x10E3/uL   Lymphocytes Absolute 2.0 0.7 - 3.1 x10E3/uL   Monocytes Absolute 0.6 0.1 - 0.9 x10E3/uL   EOS (ABSOLUTE) 0.3 0.0 - 0.4 x10E3/uL   Basophils Absolute 0.1 0.0 - 0.2 x10E3/uL   Immature  Granulocytes 0 Not Estab. %   Immature Grans (Abs) 0.0 0.0 - 0.1 x10E3/uL  Lipid panel  Result Value Ref Range   Cholesterol, Total 153 100 - 199 mg/dL   Triglycerides 77 0 - 149 mg/dL   HDL 54 >39 mg/dL   VLDL Cholesterol Cal 15 5 - 40 mg/dL   LDL Calculated 84 0 - 99 mg/dL   Chol/HDL Ratio 2.8 0.0 - 5.0 ratio  Thyroid Panel With TSH  Result Value Ref Range   TSH 3.230 0.450 - 4.500 uIU/mL   T4, Total 7.2 4.5 - 12.0 ug/dL   T3 Uptake Ratio 27 24 - 39 %   Free Thyroxine Index 1.9 1.2 - 4.9  Rocky mtn spotted fvr abs pnl(IgG+IgM)  Result Value Ref Range   RMSF IgG WILL FOLLOW    RMSF IgM WILL FOLLOW   Lyme Ab/Western Blot Reflex  Result Value Ref Range   Lyme IgG/IgM Ab WILL FOLLOW    LYME DISEASE AB, QUANT, IGM WILL FOLLOW   VITAMIN D 25 Hydroxy (Vit-D Deficiency, Fractures)  Result Value Ref Range   Vit D, 25-Hydroxy 54.7 30.0 - 100.0 ng/mL  Vitamin B12  Result Value Ref Range   Vitamin B-12 WILL FOLLOW        Pertinent labs & imaging results that were available during my care of the patient were reviewed by me and considered in my medical decision making.  Assessment & Plan:  Hyun was seen today for medical management of chronic issues, copd, gastroesophageal reflux and extremity weakness.  Diagnoses and all orders for this visit:  Thoracic aortic atherosclerosis ([place]) Diet and exercise encouraged. Labs pending.  -     CMP14+EGFR -     Lipid panel  Gastroesophageal reflux disease without esophagitis Diet discussed. Avoid fried, spicy, fatty, and acidic foods. Avoid caffeine, nicotine, and alcohol. Do not eat 2-3 hours before bedtime and stay upright for at least 1-2 hours after eating. Eat small frequent meals. Avoid NSAID's like motrin and aleve.  Report any new or worsening symptoms. Follow up as discussed or sooner if needed.   -     CBC with Differential/Platelet  COPD with exacerbation ([place]) Ongoing and worsening symptoms. Pt has not restarted the nebulizer treatments or the doxycycline prescribed by pulmonology. Pt aware to restart these and add a burt of steroids. Report any new or worsening symptoms. Follow up for reevaluation in 1-2 weeks or sooner if needed.  -     predniSONE (DELTASONE) 20 MG tablet; 2 po at sametime daily for 5 days  Vitamin D deficiency Labs pending. Continue repletion therapy. If indicated, will change repletion dosage. Eat foods rich in Vit D including milk, orange juice, yogurt with vitamin D added, salmon or mackerel, canned tuna fish, cereals with vitamin D added, and cod liver oil. Get out in the sun but make sure to wear at least SPF 30 sunscreen.  -     CBC with Differential/Platelet -     VITAMIN D 25 Hydroxy (Vit-D Deficiency, Fractures) -     Vitamin B12  Weakness Other fatigue Likely due to age and deconditioning. Will check below labs. Pt aware to take medications as prescribed. Diet and exercise discussed. Changes associated with aging discussed.  -     CMP14+EGFR -     CBC with Differential/Platelet -     Brain natriuretic peptide -     Thyroid Panel With TSH -     Rocky mtn spotted fvr abs pnl(IgG+IgM) -  Lyme Ab/Western Blot Reflex -     Vitamin B12  Tick bite, subsequent encounter Will check below due to previous bite and ongoing fatigue, weakness, and diarrhea.  -     Rocky mtn spotted fvr abs pnl(IgG+IgM) -     Lyme Ab/Western Blot Reflex  DOE (dyspnea on exertion) Likely due to age and deconditioning. Will check below. Pt aware to take medications as prescribed and to follow up in 2 weeks. Report any new or worsening symptoms.  -     CMP14+EGFR -     CBC with Differential/Platelet -     Brain natriuretic peptide -     Thyroid Panel With TSH  Irritable bowel syndrome with  both constipation and diarrhea Symptomatic care discussed. Report any new or worsening symptoms. Will check for B12 deficiency.  -     Vitamin B12     Continue all other maintenance medications.  Follow up plan: Return in about 2 weeks (around 02/04/2019), or if symptoms worsen or fail to improve, for COPD.  Continue healthy lifestyle choices, including diet (rich in fruits, vegetables, and lean proteins, and low in salt and simple carbohydrates) and exercise (at least 30 minutes of moderate physical activity daily).  Educational handout given for COPD  The above assessment and management plan was discussed with the patient. The patient verbalized understanding of and has agreed to the management plan. Patient is aware to call the clinic if symptoms persist or worsen. Patient is aware when to return to the clinic for a follow-up visit. Patient educated on when it is appropriate to go to the emergency department.   Total time spent with patient 40 minutes.  Greater than 50% of encounter spent in coordination of care/counseling.  [name], FNP-C [facility] [phone]

## 2019-01-23 LAB — LIPID PANEL
Chol/HDL Ratio: 2.8 ratio (ref 0.0–5.0)
Cholesterol, Total: 153 mg/dL (ref 100–199)
HDL: 54 mg/dL (ref >39)
LDL Calculated: 84 mg/dL (ref 0–99)
Triglycerides: 77 mg/dL (ref 0–149)
VLDL Cholesterol Cal: 15 mg/dL (ref 5–40)

## 2019-01-23 LAB — THYROID PANEL WITH TSH
Free Thyroxine Index: 1.9 (ref 1.2–4.9)
T3 Uptake Ratio: 27 % (ref 24–39)
T4, Total: 7.2 ug/dL (ref 4.5–12.0)
TSH: 3.23 u[IU]/mL (ref 0.450–4.500)

## 2019-01-23 LAB — CBC WITH DIFFERENTIAL/PLATELET
Basophils Absolute: 0.1 10*3/uL (ref 0.0–0.2)
Basos: 1 %
EOS (ABSOLUTE): 0.3 10*3/uL (ref 0.0–0.4)
Eos: 4 %
Hematocrit: 41.9 % (ref 37.5–51.0)
Hemoglobin: 13.6 g/dL (ref 13.0–17.7)
Immature Grans (Abs): 0 10*3/uL (ref 0.0–0.1)
Immature Granulocytes: 0 %
Lymphocytes Absolute: 2 10*3/uL (ref 0.7–3.1)
Lymphs: 26 %
MCH: 29.8 pg (ref 26.6–33.0)
MCHC: 32.5 g/dL (ref 31.5–35.7)
MCV: 92 fL (ref 79–97)
Monocytes Absolute: 0.6 10*3/uL (ref 0.1–0.9)
Monocytes: 8 %
Neutrophils Absolute: 4.7 10*3/uL (ref 1.4–7.0)
Neutrophils: 61 %
Platelets: 250 10*3/uL (ref 150–450)
RBC: 4.56 x10E6/uL (ref 4.14–5.80)
RDW: 12.3 % (ref 11.6–15.4)
WBC: 7.6 10*3/uL (ref 3.4–10.8)

## 2019-01-23 LAB — CMP14+EGFR
ALT: 9 IU/L (ref 0–44)
AST: 17 IU/L (ref 0–40)
Albumin/Globulin Ratio: 1.9 (ref 1.2–2.2)
Albumin: 4 g/dL (ref 3.5–4.6)
Alkaline Phosphatase: 81 IU/L (ref 39–117)
BUN/Creatinine Ratio: 15 (ref 10–24)
BUN: 23 mg/dL (ref 10–36)
Bilirubin Total: 0.2 mg/dL (ref 0.0–1.2)
CO2: 23 mmol/L (ref 20–29)
Calcium: 9.1 mg/dL (ref 8.6–10.2)
Chloride: 104 mmol/L (ref 96–106)
Creatinine, Ser: 1.53 mg/dL — ABNORMAL HIGH (ref 0.76–1.27)
GFR calc Af Amer: 45 mL/min/{1.73_m2} — ABNORMAL LOW (ref >59)
GFR calc non Af Amer: 39 mL/min/{1.73_m2} — ABNORMAL LOW (ref >59)
Globulin, Total: 2.1 g/dL (ref 1.5–4.5)
Glucose: 106 mg/dL — ABNORMAL HIGH (ref 65–99)
Potassium: 5.1 mmol/L (ref 3.5–5.2)
Sodium: 142 mmol/L (ref 134–144)
Total Protein: 6.1 g/dL (ref 6.0–8.5)

## 2019-01-23 LAB — LYME AB/WESTERN BLOT REFLEX
LYME DISEASE AB, QUANT, IGM: <0.8 index (ref 0.00–0.79)
Lyme IgG/IgM Ab: <0.91 {ISR} (ref 0.00–0.90)

## 2019-01-23 LAB — RMSF, IGG, IFA: RMSF, IGG, IFA: <1:64 {titer}

## 2019-01-23 LAB — ROCKY MTN SPOTTED FVR ABS PNL(IGG+IGM)
RMSF IgG: POSITIVE — AB
RMSF IgM: 0.5 index (ref 0.00–0.89)

## 2019-01-23 LAB — VITAMIN D 25 HYDROXY (VIT D DEFICIENCY, FRACTURES): Vit D, 25-Hydroxy: 54.7 ng/mL (ref 30.0–100.0)

## 2019-01-23 LAB — VITAMIN B12: Vitamin B-12: 1746 pg/mL — ABNORMAL HIGH (ref 232–1245)

## 2019-01-26 ENCOUNTER — Telehealth: Payer: Self-pay | Admitting: *Deleted

## 2019-01-27 NOTE — Telephone Encounter (Signed)
Patient aware.

## 2019-01-27 NOTE — Telephone Encounter (Signed)
Ok. Let us know if this gets worse.

## 2019-02-03 ENCOUNTER — Encounter: Payer: Self-pay | Admitting: General Surgery

## 2019-02-04 ENCOUNTER — Other Ambulatory Visit: Payer: Self-pay

## 2019-02-04 ENCOUNTER — Telehealth: Payer: Self-pay

## 2019-02-04 NOTE — Telephone Encounter (Signed)
Dr. Arnoldo Morale  -----After hernia surgery on July 28---as I reported to you on August 4---one of my testicles (right one) became very "sensitive"-----wanted to let you know that is still the case,-------I have a "routine" appointment with my urologist on the 16th Sept.  Should I just wait and have him look into it? Appreciate any suggestions.      Charles Golden------ (in [place])

## 2019-02-05 ENCOUNTER — Encounter: Payer: Self-pay | Admitting: Physician Assistant

## 2019-02-05 ENCOUNTER — Ambulatory Visit (INDEPENDENT_AMBULATORY_CARE_PROVIDER_SITE_OTHER): Payer: Medicare Other | Admitting: Physician Assistant

## 2019-02-05 VITALS — BP 108/70 | HR 81 | Temp 96.9°F | Ht 70.5 in | Wt 170.4 lb

## 2019-02-05 DIAGNOSIS — J441 Chronic obstructive pulmonary disease with (acute) exacerbation: Secondary | ICD-10-CM | POA: Diagnosis not present

## 2019-02-05 DIAGNOSIS — R0609 Other forms of dyspnea: Secondary | ICD-10-CM | POA: Diagnosis not present

## 2019-02-06 DIAGNOSIS — K219 Gastro-esophageal reflux disease without esophagitis: Secondary | ICD-10-CM

## 2019-02-06 DIAGNOSIS — J479 Bronchiectasis, uncomplicated: Secondary | ICD-10-CM

## 2019-02-06 NOTE — Progress Notes (Signed)
BP 108/70   Pulse 81   Temp (!) 96.9 F (36.1 C) (Temporal)   Ht 5' 10.5" (1.791 m)   Wt 170 lb 6.4 oz (77.3 kg)   SpO2 97%   BMI 24.10 kg/m    Subjective:    Patient ID: Charles Golden, male    DOB: 08-Nov-1925, 85 y.o.   MRN: [id number]  HPI: Charles Golden is a 85 y.o. male presenting on 02/05/2019 for COPD (2 week rck )  Patient comes in for weekly recheck on COPD exacerbation.  He was seen [name], [place].  At that time they had restarted his medications.  He states that he still continues to be very tired, he has dyspnea on exertion.  He has some congestion.  But he denies significant color.  He denies fever.  He states that the use of the inhaler today has helped a little bit.  He has seen Dr. [place] Bells in the past.  He is pulmonology.  I have put a referral in for him to go back and see him to see if there is some other treatments that they can will call or we will can look further for his respiratory distress.    He states at times he does have tingling and numbness from his knees down.  But he is treated with over-the-counter B12 and his levels were normal on a recent check.  He denies any cramping in his calves whenever he walks, he does not have cravings away.    And he most recently had a cyst on his back that ruptured spontaneously and he like for me to look at it.   Past Medical History:  Diagnosis Date  . Bronchitis, chronic ([place])   . COPD (chronic obstructive pulmonary disease) ([place])   . Enlarged prostate   . GERD (gastroesophageal reflux disease)   . Glaucoma   . Glucagonoma   . Hernia, incisional    at present  . HOH (hard of hearing)   . IBS (irritable bowel syndrome)   . Sciatic pain   . Sinus congestion    Relevant past medical, surgical, family and social history reviewed and updated as indicated. Interim medical history since our last visit reviewed. Allergies and medications reviewed and updated. DATA REVIEWED: CHART IN EPIC  Family History  reviewed for pertinent findings.  Review of Systems  Constitutional: Positive for fatigue. Negative for appetite change, fever and unexpected weight change.  Eyes: Negative for pain and visual disturbance.  Respiratory: Positive for shortness of breath. Negative for cough, chest tightness and wheezing.   Cardiovascular: Negative.  Negative for chest pain, palpitations and leg swelling.  Gastrointestinal: Negative.  Negative for abdominal pain, diarrhea, nausea and vomiting.  Genitourinary: Negative.   Skin: Negative.  Negative for color change and rash.  Neurological: Positive for weakness and numbness. Negative for headaches.  Psychiatric/Behavioral: Negative.     Allergies as of 02/05/2019      Reactions   Demerol [meperidine] Other (See Comments)   Pt. States "he woke up during a colonoscopy"   Spiriva Handihaler [tiotropium Bromide Monohydrate] Other (See Comments)   Mild Urinary Retention      Medication List       Accurate as of February 05, 2019 11:59 PM. If you have any questions, ask your nurse or doctor.        STOP taking these medications   predniSONE 20 MG tablet Commonly known as: Deltasone Stopped by: Terald Sleeper, PA-C  TAKE these medications   albuterol (2.5 MG/3ML) 0.083% nebulizer solution Commonly known as: PROVENTIL Take 3 mLs (2.5 mg total) by nebulization every 6 (six) hours as needed for wheezing or shortness of breath.   Azelastine HCl 137 MCG/SPRAY Soln PLACE 1 SPRAY INTO THE NOSE 1 DAY OR 1 DOSE. USE IN EACH NOSTRIL AS DIRECTED   doxazosin 8 MG tablet Commonly known as: CARDURA Take 0.5-1 tablets (4-8 mg total) by mouth daily.   finasteride 5 MG tablet Commonly known as: PROSCAR Take 1 tablet (5 mg total) by mouth daily.   fluticasone 50 MCG/ACT nasal spray Commonly known as: FLONASE 1 SPRAY IN EACH NOSTRIL ONCE A DAY   Flutter Devi 1 Device by Other route as needed.   guaiFENesin 600 MG 12 hr tablet Commonly known as: MUCINEX  Take 600 mg by mouth as needed. Reported on 05/25/2015   latanoprost 0.005 % ophthalmic solution Commonly known as: XALATAN Place 1 drop into the left eye at bedtime.   sodium chloride HYPERTONIC 3 % nebulizer solution Take by nebulization 2 (two) times daily.   traMADol 50 MG tablet Commonly known as: Ultram Take 1 tablet (50 mg total) by mouth every 6 (six) hours as needed.   Vitamin B-12 5000 MCG Subl Place 1 tablet under the tongue daily.   Vitamin D3 50 MCG (2000 UT) Tabs Take 1 tablet by mouth daily.          Objective:    BP 108/70   Pulse 81   Temp (!) 96.9 F (36.1 C) (Temporal)   Ht 5' 10.5" (1.791 m)   Wt 170 lb 6.4 oz (77.3 kg)   SpO2 97%   BMI 24.10 kg/m   Allergies  Allergen Reactions  . Demerol [Meperidine] Other (See Comments)    Pt. States "he woke up during a colonoscopy"  . Spiriva Handihaler [Tiotropium Bromide Monohydrate] Other (See Comments)    Mild Urinary Retention    Wt Readings from Last 3 Encounters:  02/05/19 170 lb 6.4 oz (77.3 kg)  01/21/19 171 lb (77.6 kg)  12/23/18 173 lb 1 oz (78.5 kg)    Physical Exam Vitals signs and nursing note reviewed.  Constitutional:      General: He is not in acute distress.    Appearance: He is well-developed.  HENT:     Head: Normocephalic and atraumatic.  Eyes:     Conjunctiva/sclera: Conjunctivae normal.     Pupils: Pupils are equal, round, and reactive to light.  Cardiovascular:     Rate and Rhythm: Normal rate and regular rhythm.     Heart sounds: Normal heart sounds.  Pulmonary:     Effort: Pulmonary effort is normal. No respiratory distress.     Breath sounds: Normal breath sounds.  Skin:    General: Skin is warm and dry.          Comments: Area of a ruptured sebaceous cyst that is well closed and noninfected  Psychiatric:        Behavior: Behavior normal.     Results for orders placed or performed in visit on 01/21/19  CMP14+EGFR  Result Value Ref Range   Glucose 106 (H)  65 - 99 mg/dL   BUN 23 10 - 36 mg/dL   Creatinine, Ser 1.53 (H) 0.76 - 1.27 mg/dL   GFR calc non Af Amer 39 (L) >59 mL/min/1.73   GFR calc Af Amer 45 (L) >59 mL/min/1.73   BUN/Creatinine Ratio 15 10 - 24   Sodium 142  134 - 144 mmol/L   Potassium 5.1 3.5 - 5.2 mmol/L   Chloride 104 96 - 106 mmol/L   CO2 23 20 - 29 mmol/L   Calcium 9.1 8.6 - 10.2 mg/dL   Total Protein 6.1 6.0 - 8.5 g/dL   Albumin 4.0 3.5 - 4.6 g/dL   Globulin, Total 2.1 1.5 - 4.5 g/dL   Albumin/Globulin Ratio 1.9 1.2 - 2.2   Bilirubin Total 0.2 0.0 - 1.2 mg/dL   Alkaline Phosphatase 81 39 - 117 IU/L   AST 17 0 - 40 IU/L   ALT 9 0 - 44 IU/L  CBC with Differential/Platelet  Result Value Ref Range   WBC 7.6 3.4 - 10.8 x10E3/uL   RBC 4.56 4.14 - 5.80 x10E6/uL   Hemoglobin 13.6 13.0 - 17.7 g/dL   Hematocrit 41.9 37.5 - 51.0 %   MCV 92 79 - 97 fL   MCH 29.8 26.6 - 33.0 pg   MCHC 32.5 31.5 - 35.7 g/dL   RDW 12.3 11.6 - 15.4 %   Platelets 250 150 - 450 x10E3/uL   Neutrophils 61 Not Estab. %   Lymphs 26 Not Estab. %   Monocytes 8 Not Estab. %   Eos 4 Not Estab. %   Basos 1 Not Estab. %   Neutrophils Absolute 4.7 1.4 - 7.0 x10E3/uL   Lymphocytes Absolute 2.0 0.7 - 3.1 x10E3/uL   Monocytes Absolute 0.6 0.1 - 0.9 x10E3/uL   EOS (ABSOLUTE) 0.3 0.0 - 0.4 x10E3/uL   Basophils Absolute 0.1 0.0 - 0.2 x10E3/uL   Immature Granulocytes 0 Not Estab. %   Immature Grans (Abs) 0.0 0.0 - 0.1 x10E3/uL  Brain natriuretic peptide  Result Value Ref Range   BNP 80.1 0.0 - 100.0 pg/mL  Lipid panel  Result Value Ref Range   Cholesterol, Total 153 100 - 199 mg/dL   Triglycerides 77 0 - 149 mg/dL   HDL 54 >39 mg/dL   VLDL Cholesterol Cal 15 5 - 40 mg/dL   LDL Calculated 84 0 - 99 mg/dL   Chol/HDL Ratio 2.8 0.0 - 5.0 ratio  Thyroid Panel With TSH  Result Value Ref Range   TSH 3.230 0.450 - 4.500 uIU/mL   T4, Total 7.2 4.5 - 12.0 ug/dL   T3 Uptake Ratio 27 24 - 39 %   Free Thyroxine Index 1.9 1.2 - 4.9  Rocky mtn spotted fvr abs  pnl(IgG+IgM)  Result Value Ref Range   RMSF IgG Positive (A) Negative   RMSF IgM 0.50 0.00 - 0.89 index  Lyme Ab/Western Blot Reflex  Result Value Ref Range   Lyme IgG/IgM Ab <0.91 0.00 - 0.90 ISR   LYME DISEASE AB, QUANT, IGM <0.80 0.00 - 0.79 index  VITAMIN D 25 Hydroxy (Vit-D Deficiency, Fractures)  Result Value Ref Range   Vit D, 25-Hydroxy 54.7 30.0 - 100.0 ng/mL  Vitamin B12  Result Value Ref Range   Vitamin B-12 1,746 (H) 232 - 1,245 pg/mL  RMSF, IgG, IFA  Result Value Ref Range   RMSF, IGG, IFA <1:64 Neg <1:64      Assessment & Plan:   1. COPD with exacerbation ([place]) - Ambulatory referral to Pulmonology  2. Dyspnea on exertion - Ambulatory referral to Pulmonology   Continue all other maintenance medications as listed above.  Follow up plan: Return in about 3 weeks (around 02/26/2019) for recheck with [facility].  Educational handout given for [place] PA-C [place],  [place] [address] [phone]   02/06/2019, 5:27 PM

## 2019-02-09 MED ORDER — ALBUTEROL SULFATE (2.5 MG/3ML) 0.083% IN NEBU: 2.5000 mg | INHALATION_SOLUTION | Freq: Four times a day (QID) | RESPIRATORY_TRACT | 1 refills | Status: DC | PRN

## 2019-02-09 MED ORDER — SODIUM CHLORIDE 3 % IN NEBU: INHALATION_SOLUTION | Freq: Two times a day (BID) | RESPIRATORY_TRACT | 1 refills | Status: DC

## 2019-02-11 DIAGNOSIS — N481 Balanitis: Secondary | ICD-10-CM | POA: Diagnosis not present

## 2019-02-11 DIAGNOSIS — N401 Enlarged prostate with lower urinary tract symptoms: Secondary | ICD-10-CM | POA: Diagnosis not present

## 2019-02-11 DIAGNOSIS — R351 Nocturia: Secondary | ICD-10-CM | POA: Diagnosis not present

## 2019-02-11 DIAGNOSIS — R8271 Bacteriuria: Secondary | ICD-10-CM | POA: Diagnosis not present

## 2019-03-03 ENCOUNTER — Other Ambulatory Visit: Payer: Self-pay

## 2019-03-04 ENCOUNTER — Encounter: Payer: Self-pay | Admitting: Family Medicine

## 2019-03-04 ENCOUNTER — Ambulatory Visit (INDEPENDENT_AMBULATORY_CARE_PROVIDER_SITE_OTHER): Payer: Medicare Other | Admitting: Family Medicine

## 2019-03-04 VITALS — BP 103/55 | HR 79 | Temp 97.7°F | Resp 20 | Ht 70.5 in | Wt 171.0 lb

## 2019-03-04 DIAGNOSIS — Z23 Encounter for immunization: Secondary | ICD-10-CM | POA: Diagnosis not present

## 2019-03-04 DIAGNOSIS — J449 Chronic obstructive pulmonary disease, unspecified: Secondary | ICD-10-CM

## 2019-03-04 DIAGNOSIS — K219 Gastro-esophageal reflux disease without esophagitis: Secondary | ICD-10-CM | POA: Diagnosis not present

## 2019-03-04 MED ORDER — OMEPRAZOLE 20 MG PO CPDR: 20.0000 mg | DELAYED_RELEASE_CAPSULE | Freq: Every day | ORAL | 3 refills | Status: DC

## 2019-03-04 MED ORDER — FLUTICASONE FUROATE-VILANTEROL 200-25 MCG/INH IN AEPB: 1.0000 | INHALATION_SPRAY | Freq: Every day | RESPIRATORY_TRACT | 6 refills | Status: AC

## 2019-03-04 NOTE — Patient Instructions (Signed)
It was a pleasure seeing you today, Charles Golden.  Information regarding what we discussed is included in this packet.  Please make an appointment to see me in 3 months.   In a few days you may receive a survey in the mail or online from [employer] regarding your visit with [place] today. Please take a moment to fill this out. Your feedback is very important to our office. It can help [place] better understand your needs as well as improve your experience and satisfaction. Thank you for taking your time to complete it. We care about you.  Because of recent events of COVID-19 ("Coronavirus"), please follow CDC recommendations:   1. Wash your hand frequently 2. Avoid touching your face 3. Stay away from people who are sick 4. If you have symptoms such as fever, cough, shortness of breath then call your healthcare provider for further guidance 5. If you are sick, STAY AT HOME, unless otherwise directed by your healthcare provider. 6. Follow directions from state and national officials regarding staying safe    Please feel free to call our office if any questions or concerns arise.  Warm Regards, [name], FNP-C [facility] [address] [place], [place] [address] [phone]

## 2019-03-04 NOTE — Progress Notes (Addendum)
Subjective:  Patient ID: Charles Golden, male    DOB: [birth year], 85 y.o.   MRN: [id number]  Patient Care Team: Baruch Gouty, FNP as PCP - General (Family Medicine) Irine Seal, MD (Urology) Richmond Campbell, MD (Gastroenterology) Clent Jacks, MD (Ophthalmology) Gaynelle Arabian, MD as Consulting Physician (Orthopedic Surgery) Jodi Marble, MD as Consulting Physician (Otolaryngology) Juanito Doom, MD as Consulting Physician (Pulmonary Disease) Kathrynn Ducking, MD as Consulting Physician (Neurology)   Chief Complaint:  COPD (follow up - 3 week )   HPI: Charles Golden is a 85 y.o. male presenting on 03/04/2019 for COPD (follow up - 3 week )   Charles Golden is a 85 yo male who presents for a 3 week follow up for COPD. He reports that his symptoms are unchanged since his last visit. He reports that he did take the oral steroids and antibiotic that the pulmonologist prescribed previously. He has not seen a pulmonologist since the latest referral. He reports fatigue, especially in the morning. He continues to need 2-3 naps a day. He denies snoring or hx or sleep apnea. He reports exertional dyspnea that is worse in the morning. He can walk about 30 feet in the am before having to sit due to SOB. In the afternoon he can manage 60 feet. He continues to have a large amount of sputum production, the color ranges from clear, to white, to yellow. He denies CP, palpitation, fever, or chills. He uses his albuterol inhaler 1-2 times a day with some relief. He continues to take mucinex daily.   He also reports an increase in heartburn symptoms. He has good relief with tums.  He occasional feels light-headed, about 2x a week for a few minutes. He hasn't noticed if this occur with position changes or exertion.   Shortness of Breath This is a chronic problem. The problem has been unchanged. Associated symptoms include sputum production and wheezing. Pertinent negatives include no abdominal pain, chest  pain, claudication, ear pain, fever, leg swelling, orthopnea, PND, rash, sore throat, syncope or vomiting. The symptoms are aggravated by exercise and any activity. He has tried beta agonist inhalers and oral steroids for the symptoms. The treatment provided mild relief. His past medical history is significant for COPD.  Gastroesophageal Reflux He complains of coughing, heartburn and wheezing. He reports no abdominal pain, no chest pain, no hoarse voice, no nausea or no sore throat. The problem occurs frequently. The problem has been gradually worsening. The heartburn is of moderate intensity. The heartburn does not wake him from sleep. The heartburn does not limit his activity. The heartburn doesn't change with position. The symptoms are aggravated by certain foods. Associated symptoms include fatigue. Pertinent negatives include no melena, orthopnea or weight loss. He has tried an antacid for the symptoms. The treatment provided moderate relief.    Relevant past medical, surgical, family, and social history reviewed and updated as indicated.  Allergies and medications reviewed and updated. Date reviewed: Chart in Epic.   Past Medical History:  Diagnosis Date  . Bronchitis, chronic ([place])   . COPD (chronic obstructive pulmonary disease) ([place])   . Enlarged prostate   . GERD (gastroesophageal reflux disease)   . Glaucoma   . Glucagonoma   . Hernia, incisional    at present  . HOH (hard of hearing)   . IBS (irritable bowel syndrome)   . Sciatic pain   . Sinus congestion     Past Surgical History:  Procedure Laterality Date  .  BOWEL RESECTION N/A 09/04/2012   Procedure: SMALL BOWEL RESECTION;  Surgeon: Donato Heinz, MD;  Location: [facility];  Service: General;  Laterality: N/A;  Anastimosis  . HEMORRHOID SURGERY    . HERNIA REPAIR Bilateral 70's  . INGUINAL HERNIA REPAIR Left 09/14/2013   Procedure: LEFT INGUINAL HERNIORRHAPHY;  Surgeon: Jamesetta So, MD;  Location: [facility];  Service:  General;  Laterality: Left;  . INGUINAL HERNIA REPAIR Right 12/23/2018   Procedure: RECURRENT RIGHT INGUINAL HERNIA  REPAIR  WITH MESH;  Surgeon: Aviva Signs, MD;  Location: [facility];  Service: General;  Laterality: Right;  . INSERTION OF MESH Left 09/14/2013   Procedure: INSERTION OF MESH;  Surgeon: Jamesetta So, MD;  Location: [facility];  Service: General;  Laterality: Left;  . KNEE ARTHROSCOPY Left 07/21/2014   Procedure: LEFT KNEE ARTHROSCOPY WITH MEDIAL MENISCAL DEBRIDEMENT ;  Surgeon: Gearlean Alf, MD;  Location: [facility];  Service: Orthopedics;  Laterality: Left;  . LAPAROTOMY N/A 09/04/2012   Procedure: EXPLORATORY LAPAROTOMY;  Surgeon: Donato Heinz, MD;  Location: [facility];  Service: General;  Laterality: N/A;  . SKIN LESION EXCISION     [name]  . TONSILLECTOMY      Social History   Socioeconomic History  . Marital status: Married    Spouse name: [name]  . Number of children: 3  . Years of education: 12+  . Highest education level: Bachelor's degree (e.g., BA, AB, BS)  Occupational History  . Occupation: Retired    Comment: Landscape architect  . Financial resource strain: Not hard at all  . Food insecurity    Worry: Never true    Inability: Never true  . Transportation needs    Medical: No    Non-medical: No  Tobacco Use  . Smoking status: Former Smoker    Packs/day: 1.00    Years: 30.00    Pack years: 30.00    Types: Cigarettes    Quit date: 05/28/2006    Years since quitting: 12.7  . Smokeless tobacco: Never Used  Substance and Sexual Activity  . Alcohol use: Not Currently    Alcohol/week: 0.0 standard drinks  . Drug use: No  . Sexual activity: Yes    Birth control/protection: None  Lifestyle  . Physical activity    Days per week: 0 days    Minutes per session: 0 min  . Stress: Not at all  Relationships  . Social connections    Talks on phone: More than three times a week    Gets together: More than three times a week    Attends religious  service: More than 4 times per year    Active member of club or organization: Yes    Attends meetings of clubs or organizations: More than 4 times per year    Relationship status: Married  . Intimate partner violence    Fear of current or ex partner: No    Emotionally abused: No    Physically abused: No    Forced sexual activity: No  Other Topics Concern  . Not on file  Social History Narrative   Patient lives at home with his wife [name].    Patient has 3 children.    Patient has his BS   Patient is retired.    Drinks about 2 cups of coffee per day.      [facility]:   He is still married. He previously worked as a Immunologist. No significant dust  exposure. He mostly worked with synthetic fibers. He is from [place].        Outpatient Encounter Medications as of 03/04/2019  Medication Sig  . albuterol (PROVENTIL) (2.5 MG/3ML) 0.083% nebulizer solution Take 3 mLs (2.5 mg total) by nebulization every 6 (six) hours as needed for wheezing or shortness of breath.  . Azelastine HCl 137 MCG/SPRAY SOLN PLACE 1 SPRAY INTO THE NOSE 1 DAY OR 1 DOSE. USE IN EACH NOSTRIL AS DIRECTED  . Cholecalciferol (VITAMIN D3) 2000 UNITS TABS Take 1 tablet by mouth daily.  . Cyanocobalamin (VITAMIN B-12) 5000 MCG SUBL Place 1 tablet under the tongue daily.  Marland Kitchen doxazosin (CARDURA) 8 MG tablet Take 0.5-1 tablets (4-8 mg total) by mouth daily.  . finasteride (PROSCAR) 5 MG tablet Take 1 tablet (5 mg total) by mouth daily.  . fluticasone (FLONASE) 50 MCG/ACT nasal spray 1 SPRAY IN EACH NOSTRIL ONCE A DAY  . guaiFENesin (MUCINEX) 600 MG 12 hr tablet Take 600 mg by mouth as needed. Reported on 05/25/2015  . latanoprost (XALATAN) 0.005 % ophthalmic solution Place 1 drop into the left eye at bedtime.  Marland Kitchen Respiratory Therapy Supplies (FLUTTER) DEVI 1 Device by Other route as needed.  . sodium chloride HYPERTONIC 3 % nebulizer solution Take by nebulization 2 (two) times daily.  . traMADol (ULTRAM) 50 MG  tablet Take 1 tablet (50 mg total) by mouth every 6 (six) hours as needed.  . fluticasone furoate-vilanterol (BREO ELLIPTA) 200-25 MCG/INH AEPB Inhale 1 puff into the lungs daily.  Marland Kitchen omeprazole (PRILOSEC) 20 MG capsule Take 1 capsule (20 mg total) by mouth daily.   No facility-administered encounter medications on file as of 03/04/2019.     Allergies  Allergen Reactions  . Demerol [Meperidine] Other (See Comments)    Pt. States "he woke up during a colonoscopy"  . Spiriva Handihaler [Tiotropium Bromide Monohydrate] Other (See Comments)    Mild Urinary Retention    Review of Systems  Constitutional: Positive for fatigue. Negative for chills, diaphoresis, fever, unexpected weight change and weight loss.  HENT: Negative for congestion, ear pain, hoarse voice, postnasal drip, sneezing, sore throat, trouble swallowing and voice change.   Eyes: Negative for visual disturbance.  Respiratory: Positive for cough, sputum production, shortness of breath and wheezing.   Cardiovascular: Negative for chest pain, palpitations, orthopnea, claudication, leg swelling, syncope and PND.  Gastrointestinal: Positive for heartburn. Negative for abdominal pain, blood in stool, constipation, diarrhea, melena, nausea and vomiting.  Genitourinary: Negative for decreased urine volume and dysuria.  Musculoskeletal: Negative for myalgias.  Skin: Negative for rash.  Neurological: Positive for light-headedness. Negative for dizziness, syncope, speech difficulty, weakness and numbness.  Psychiatric/Behavioral: Negative for confusion and dysphoric mood.        Objective:  BP (!) 103/55   Pulse 79   Temp 97.7 F (36.5 C)   Resp 20   Ht 5' 10.5" (1.791 m)   Wt 171 lb (77.6 kg)   SpO2 97%   BMI 24.19 kg/m    Wt Readings from Last 3 Encounters:  03/04/19 171 lb (77.6 kg)  02/05/19 170 lb 6.4 oz (77.3 kg)  01/21/19 171 lb (77.6 kg)    Physical Exam Constitutional:      General: He is not in acute  distress.    Appearance: Normal appearance. He is normal weight. He is not ill-appearing, toxic-appearing or diaphoretic.  HENT:     Head: Normocephalic and atraumatic.     Right Ear: Tympanic membrane normal.  Left Ear: Tympanic membrane normal.     Nose: Nose normal.     Mouth/Throat:     Mouth: Mucous membranes are moist.     Pharynx: Oropharynx is clear.  Eyes:     Extraocular Movements: Extraocular movements intact.     Pupils: Pupils are equal, round, and reactive to light.  Cardiovascular:     Rate and Rhythm: Normal rate and regular rhythm.     Pulses: Normal pulses.     Heart sounds: Normal heart sounds. No murmur.  Pulmonary:     Effort: Pulmonary effort is normal. No respiratory distress.     Breath sounds: No stridor. Examination of the right-upper field reveals decreased breath sounds. Examination of the left-upper field reveals decreased breath sounds. Examination of the right-middle field reveals decreased breath sounds. Examination of the left-middle field reveals decreased breath sounds. Examination of the right-lower field reveals decreased breath sounds. Examination of the left-lower field reveals decreased breath sounds. Decreased breath sounds present. No wheezing, rhonchi or rales.  Chest:     Chest wall: No tenderness.  Abdominal:     General: Bowel sounds are normal.     Palpations: Abdomen is soft.     Tenderness: There is no abdominal tenderness.  Musculoskeletal:     Right lower leg: No edema.     Left lower leg: No edema.  Skin:    General: Skin is warm and dry.     Capillary Refill: Capillary refill takes less than 2 seconds.  Neurological:     Mental Status: He is alert and oriented to person, place, and time. Mental status is at baseline.     Gait: Gait normal.  Psychiatric:        Mood and Affect: Mood normal.        Behavior: Behavior normal.        Thought Content: Thought content normal.        Judgment: Judgment normal.     Results for  orders placed or performed in visit on 01/21/19  CMP14+EGFR  Result Value Ref Range   Glucose 106 (H) 65 - 99 mg/dL   BUN 23 10 - 36 mg/dL   Creatinine, Ser 1.53 (H) 0.76 - 1.27 mg/dL   GFR calc non Af Amer 39 (L) >59 mL/min/1.73   GFR calc Af Amer 45 (L) >59 mL/min/1.73   BUN/Creatinine Ratio 15 10 - 24   Sodium 142 134 - 144 mmol/L   Potassium 5.1 3.5 - 5.2 mmol/L   Chloride 104 96 - 106 mmol/L   CO2 23 20 - 29 mmol/L   Calcium 9.1 8.6 - 10.2 mg/dL   Total Protein 6.1 6.0 - 8.5 g/dL   Albumin 4.0 3.5 - 4.6 g/dL   Globulin, Total 2.1 1.5 - 4.5 g/dL   Albumin/Globulin Ratio 1.9 1.2 - 2.2   Bilirubin Total 0.2 0.0 - 1.2 mg/dL   Alkaline Phosphatase 81 39 - 117 IU/L   AST 17 0 - 40 IU/L   ALT 9 0 - 44 IU/L  CBC with Differential/Platelet  Result Value Ref Range   WBC 7.6 3.4 - 10.8 x10E3/uL   RBC 4.56 4.14 - 5.80 x10E6/uL   Hemoglobin 13.6 13.0 - 17.7 g/dL   Hematocrit 41.9 37.5 - 51.0 %   MCV 92 79 - 97 fL   MCH 29.8 26.6 - 33.0 pg   MCHC 32.5 31.5 - 35.7 g/dL   RDW 12.3 11.6 - 15.4 %   Platelets 250 150 - 450  x10E3/uL   Neutrophils 61 Not Estab. %   Lymphs 26 Not Estab. %   Monocytes 8 Not Estab. %   Eos 4 Not Estab. %   Basos 1 Not Estab. %   Neutrophils Absolute 4.7 1.4 - 7.0 x10E3/uL   Lymphocytes Absolute 2.0 0.7 - 3.1 x10E3/uL   Monocytes Absolute 0.6 0.1 - 0.9 x10E3/uL   EOS (ABSOLUTE) 0.3 0.0 - 0.4 x10E3/uL   Basophils Absolute 0.1 0.0 - 0.2 x10E3/uL   Immature Granulocytes 0 Not Estab. %   Immature Grans (Abs) 0.0 0.0 - 0.1 x10E3/uL  Brain natriuretic peptide  Result Value Ref Range   BNP 80.1 0.0 - 100.0 pg/mL  Lipid panel  Result Value Ref Range   Cholesterol, Total 153 100 - 199 mg/dL   Triglycerides 77 0 - 149 mg/dL   HDL 54 >39 mg/dL   VLDL Cholesterol Cal 15 5 - 40 mg/dL   LDL Calculated 84 0 - 99 mg/dL   Chol/HDL Ratio 2.8 0.0 - 5.0 ratio  Thyroid Panel With TSH  Result Value Ref Range   TSH 3.230 0.450 - 4.500 uIU/mL   T4, Total 7.2 4.5 -  12.0 ug/dL   T3 Uptake Ratio 27 24 - 39 %   Free Thyroxine Index 1.9 1.2 - 4.9  Rocky mtn spotted fvr abs pnl(IgG+IgM)  Result Value Ref Range   RMSF IgG Positive (A) Negative   RMSF IgM 0.50 0.00 - 0.89 index  Lyme Ab/Western Blot Reflex  Result Value Ref Range   Lyme IgG/IgM Ab <0.91 0.00 - 0.90 ISR   LYME DISEASE AB, QUANT, IGM <0.80 0.00 - 0.79 index  VITAMIN D 25 Hydroxy (Vit-D Deficiency, Fractures)  Result Value Ref Range   Vit D, 25-Hydroxy 54.7 30.0 - 100.0 ng/mL  Vitamin B12  Result Value Ref Range   Vitamin B-12 1,746 (H) 232 - 1,245 pg/mL  RMSF, IgG, IFA  Result Value Ref Range   RMSF, IGG, IFA <1:64 Neg <1:64       Pertinent labs & imaging results that were available during my care of the patient were reviewed by me and considered in my medical decision making.  Assessment & Plan:  Victorious was seen today for copd.  Diagnoses and all orders for this visit:  COPD GOLD I  No alarming signs on exam or concern for infectious process at this time. Start Breo Ellipta for daily control of COPD symptoms. Continue albuterol as needed. Discussed disease process and progression of COPD.  Will check on referral to pulmonology -     fluticasone furoate-vilanterol (BREO ELLIPTA) 200-25 MCG/INH AEPB; Inhale 1 puff into the lungs daily.  GERD without esophagitis No red flags present. Diet discussed. Avoid fried, spicy, fatty, greasy, and acidic foods. Avoid caffeine, nicotine, and alcohol. Do not eat 2-3 hours before bedtime and stay upright for at least 1-2 hours after eating. Eat small frequent meals. Avoid NSAID's like motrin and aleve. Medications as prescribed. Report any new or worsening symptoms. Follow up as discussed or sooner if needed.   -     omeprazole (PRILOSEC) 20 MG capsule; Take 1 capsule (20 mg total) by mouth daily.  Influenza vaccine given today.   Continue all other maintenance medications.  Follow up plan: Return in about 3 months (around 06/04/2019), or if  symptoms worsen or fail to improve.  Continue healthy lifestyle choices, including diet (rich in fruits, vegetables, and lean proteins, and low in salt and simple carbohydrates) and exercise (at least 30  minutes of moderate physical activity daily).  Educational handout given for health maintenance.  The above assessment and management plan was discussed with the patient. The patient verbalized understanding of and has agreed to the management plan. Patient is aware to call the clinic if they develop any new symptoms or if symptoms persist or worsen. Patient is aware when to return to the clinic for a follow-up visit. Patient educated on when it is appropriate to go to the emergency department.   Marjorie Smolder, RN, FNP student [facility] [phone]   I personally was present during the history, physical exam, and medical decision-making activities of this service and have verified that the service and findings are accurately documented in the nurse practitioner student's note.  [name], FNP-C [facility] [address] [place], [place] [address] [phone]

## 2019-03-05 DIAGNOSIS — H353131 Nonexudative age-related macular degeneration, bilateral, early dry stage: Secondary | ICD-10-CM | POA: Diagnosis not present

## 2019-03-05 DIAGNOSIS — H401122 Primary open-angle glaucoma, left eye, moderate stage: Secondary | ICD-10-CM | POA: Diagnosis not present

## 2019-03-05 DIAGNOSIS — H40051 Ocular hypertension, right eye: Secondary | ICD-10-CM | POA: Diagnosis not present

## 2019-03-05 DIAGNOSIS — Z961 Presence of intraocular lens: Secondary | ICD-10-CM | POA: Diagnosis not present

## 2019-03-10 ENCOUNTER — Encounter: Payer: Self-pay | Admitting: Family Medicine

## 2019-03-10 ENCOUNTER — Telehealth: Payer: Self-pay

## 2019-03-10 NOTE — Telephone Encounter (Signed)
Patient aware that referral was sent to [facility]. He will wait for their call.

## 2019-03-10 NOTE — Telephone Encounter (Signed)
Patient is calling to check on pulmonology referral. Please advise

## 2019-03-19 ENCOUNTER — Other Ambulatory Visit: Payer: Self-pay | Admitting: Family Medicine

## 2019-03-19 DIAGNOSIS — R519 Headache, unspecified: Secondary | ICD-10-CM

## 2019-03-19 NOTE — Progress Notes (Unsigned)
heaa

## 2019-03-25 DIAGNOSIS — N401 Enlarged prostate with lower urinary tract symptoms: Secondary | ICD-10-CM | POA: Diagnosis not present

## 2019-03-25 DIAGNOSIS — R351 Nocturia: Secondary | ICD-10-CM | POA: Diagnosis not present

## 2019-03-25 DIAGNOSIS — R3912 Poor urinary stream: Secondary | ICD-10-CM | POA: Diagnosis not present

## 2019-04-06 ENCOUNTER — Ambulatory Visit (INDEPENDENT_AMBULATORY_CARE_PROVIDER_SITE_OTHER): Payer: Medicare Other | Admitting: Pulmonary Disease

## 2019-04-06 ENCOUNTER — Other Ambulatory Visit: Payer: Self-pay

## 2019-04-06 ENCOUNTER — Ambulatory Visit (INDEPENDENT_AMBULATORY_CARE_PROVIDER_SITE_OTHER): Payer: Medicare Other

## 2019-04-06 ENCOUNTER — Encounter: Payer: Self-pay | Admitting: Pulmonary Disease

## 2019-04-06 VITALS — BP 122/68 | HR 68 | Temp 98.7°F | Ht 70.5 in | Wt 172.6 lb

## 2019-04-06 DIAGNOSIS — J439 Emphysema, unspecified: Secondary | ICD-10-CM | POA: Diagnosis not present

## 2019-04-06 DIAGNOSIS — J449 Chronic obstructive pulmonary disease, unspecified: Secondary | ICD-10-CM

## 2019-04-06 DIAGNOSIS — J479 Bronchiectasis, uncomplicated: Secondary | ICD-10-CM | POA: Diagnosis not present

## 2019-04-06 NOTE — Progress Notes (Signed)
Charles Golden, Critical Care, and Sleep Medicine  Chief Complaint  Patient presents with  . COPD with exaberation, Dyspnea on exertion    Has been getting out of breath for over a year.    Constitutional:  BP 122/68 (BP Location: Left Arm, Patient Position: Sitting, Cuff Size: Normal)   Pulse 68   Temp 98.7 F (37.1 C)   Ht 5' 10.5" (1.791 m)   Wt 172 lb 9.6 oz (78.3 kg)   SpO2 97% Comment: on room air  BMI 24.42 kg/m   Past Medical History:  Sciatica, IBS, Glaucoma, Glucagonoma, GERD, BPH  Brief Summary:  Charles Golden is a 85 y.o. male former smoker with BTX, COPD/emphysema and GERD.  Previously seen by Dr. Ashok Golden and Dr. Lake Golden.  He has been getting more short of breath with exertion and fatigues more easily.  He brings up sputum multiple times per day.  This is usually thick, milky white to yellow.  Also gets intermittent wheeze.  Cough and sputum worst in the morning.  Not having fever, hemoptysis, weight loss, or chest pain.    Has been using Breo, hypertonic saline, and albuterol once per day.  He hasn't been using flutter valve - his granddaughter threw it in the toilet.  He has been using flonase and azelastine for years, but isn't sure how much these are helping.  Physical Exam:   Appearance - well kempt   ENMT - clear nasal mucosa, midline nasal  septum, no oral exudates, no LAN, trachea midline  Respiratory - normal chest wall, normal respiratory effort, no accessory muscle use, no wheeze/rales  CV - s1s2 regular rate and rhythm, no murmurs, no peripheral edema, radial pulses symmetric  GI - soft, non tender, no masses  Lymph - no adenopathy noted in neck and axillary areas  MSK - normal gait  Ext - no cyanosis, clubbing, or joint inflammation noted  Skin - no rashes, lesions, or ulcers  Neuro - normal strength, oriented x 3  Psych - normal mood and affect   Assessment/Plan:   Bronchiectasis. - he has progressive symptoms of dyspnea and  fatigue - continue mucinex, hypertonic saline - resume flutter valve if above measures aren't effective - will arrange for sputum culture and AFB  COPD with chronic bronchitis and emphysema. - continue Breo and prn albuterol  Allergic rhinitis. - reassess at next visit whether he can change flonase and azelastine to prn   Patient Instructions  Chest xray today  Will arrange for testing of your phlegm  Follow up in 6 to 8 weeks    [name], MD [place] Pager: [phone] 04/06/2019, 1:58 PM  Flow Sheet     Golden tests:  A1AT 04/11/15 >> 153, MM PFT 07/13/15 >> FEV1 2.40 (92%), FEV1% 66, TLC 8.07 (112%), DLCO 75%, +BD  Chest imaging:  CT chest 04/19/15 >> mild cylindrical BTX in bases, mild centrilobular emphysema, calcified mediastinal LN  Sleep tests:  PSG 11/15/16 >> AHI 3.6  Medications:   Allergies as of 04/06/2019      Reactions   Demerol [meperidine] Other (See Comments)   Pt. States "he woke up during a colonoscopy"   Spiriva Handihaler [tiotropium Bromide Monohydrate] Other (See Comments)   Mild Urinary Retention      Medication List       Accurate as of April 06, 2019  1:58 PM. If you have any questions, ask your nurse or doctor.        STOP taking these  medications   doxazosin 8 MG tablet Commonly known as: CARDURA Stopped by: [name], MD   omeprazole 20 MG capsule Commonly known as: PRILOSEC Stopped by: [name], MD     TAKE these medications   albuterol (2.5 MG/3ML) 0.083% nebulizer solution Commonly known as: PROVENTIL Take 3 mLs (2.5 mg total) by nebulization every 6 (six) hours as needed for wheezing or shortness of breath.   Albuterol Sulfate 2.5 MG/0.5ML Nebu SMARTSIG:1 Vial(s) Via Nebulizer Every 6 Hours PRN   Azelastine HCl 137 MCG/SPRAY Soln PLACE 1 SPRAY INTO THE NOSE 1 DAY OR 1 DOSE. USE IN EACH NOSTRIL AS DIRECTED   Breo Ellipta 200-25 MCG/INH Aepb Generic drug: fluticasone  furoate-vilanterol Inhale 1 puff into the lungs daily.   finasteride 5 MG tablet Commonly known as: PROSCAR Take 1 tablet (5 mg total) by mouth daily.   fluticasone 50 MCG/ACT nasal spray Commonly known as: FLONASE 1 SPRAY IN EACH NOSTRIL ONCE A DAY   Flutter Devi 1 Device by Other route as needed.   guaiFENesin 600 MG 12 hr tablet Commonly known as: MUCINEX Take 600 mg by mouth as needed. Reported on 05/25/2015   latanoprost 0.005 % ophthalmic solution Commonly known as: XALATAN Place 1 drop into the left eye at bedtime.   sodium chloride HYPERTONIC 3 % nebulizer solution Take by nebulization 2 (two) times daily.   tamsulosin 0.4 MG Caps capsule Commonly known as: FLOMAX Take 0.4 mg by mouth daily.   timolol 0.5 % ophthalmic solution Commonly known as: TIMOPTIC Place 1 drop into the left eye every morning.   traMADol 50 MG tablet Commonly known as: Ultram Take 1 tablet (50 mg total) by mouth every 6 (six) hours as needed.   Vitamin B-12 5000 MCG Subl Place 1 tablet under the tongue daily.   Vitamin D3 50 MCG (2000 UT) Tabs Take 1 tablet by mouth daily.       Past Surgical History:  He  has a past surgical history that includes Hemorrhoid surgery; laparotomy (N/A, 09/04/2012); Bowel resection (N/A, 09/04/2012); Inguinal hernia repair (Left, 09/14/2013); Insertion of mesh (Left, 09/14/2013); Tonsillectomy; Knee arthroscopy (Left, 07/21/2014); Skin lesion excision; Hernia repair (Bilateral, 70's); and Inguinal hernia repair (Right, 12/23/2018).  Family History:  His family history includes Asthma in an other family member; Colon cancer in his father; Healthy in his daughter, daughter, and daughter; Heart disease in his mother.  Social History:  He  reports that he quit smoking about 12 years ago. His smoking use included cigarettes. He has a 30.00 pack-year smoking history. He has never used smokeless tobacco. He reports previous alcohol use. He reports that he does not  use drugs.

## 2019-04-06 NOTE — Patient Instructions (Signed)
Chest xray today  Will arrange for testing of your phlegm  Follow up in 6 to 8 weeks

## 2019-04-07 ENCOUNTER — Other Ambulatory Visit: Payer: Medicare Other

## 2019-04-07 ENCOUNTER — Telehealth: Payer: Self-pay | Admitting: Pulmonary Disease

## 2019-04-07 DIAGNOSIS — J479 Bronchiectasis, uncomplicated: Secondary | ICD-10-CM

## 2019-04-07 DIAGNOSIS — J449 Chronic obstructive pulmonary disease, unspecified: Secondary | ICD-10-CM | POA: Diagnosis not present

## 2019-04-07 NOTE — Telephone Encounter (Signed)
Dg Chest 2 View  Result Date: 04/06/2019 CLINICAL DATA:  11/13/2016 EXAM: CHEST - 2 VIEW COMPARISON:  11/13/2016 FINDINGS: Cardiomediastinal silhouette unchanged in size and contour. No evidence of central vascular congestion or interlobular septal thickening. No pneumothorax or pleural effusion. Stigmata of emphysema, with increased retrosternal airspace, flattened hemidiaphragms, increased AP diameter, and hyperinflation on the AP view. No confluent airspace disease with coarsened interstitial markings. Degenerative changes of the spine with no displaced fracture IMPRESSION: Chronic changes and emphysema without evidence of acute cardiopulmonary disease Electronically Signed   By: Corrie Mckusick D.O.   On: 04/06/2019 16:23     Please let him know CXR shows chronic changes from COPD with emphysema.  No other worrisome findings.

## 2019-04-08 ENCOUNTER — Other Ambulatory Visit: Payer: Medicare Other

## 2019-04-09 ENCOUNTER — Telehealth: Payer: Self-pay | Admitting: Family Medicine

## 2019-04-09 DIAGNOSIS — Z20828 Contact with and (suspected) exposure to other viral communicable diseases: Secondary | ICD-10-CM | POA: Diagnosis not present

## 2019-04-09 NOTE — Telephone Encounter (Signed)
Left message to call back  

## 2019-04-09 NOTE — Telephone Encounter (Signed)
Called the patient and advised him of the results. Patient voiced understanding. Nothing further needed at this time.

## 2019-04-09 NOTE — Telephone Encounter (Signed)
Message routed to Dr. Halford Chessman   Dr. Halford Chessman---   ------Today, (Nov 12) my daughter was advised that her test for Comid 19, (taken on Nov 9), was- Positive (+).   My wife and I had brief contacts, (using masks ,distance etc.) with her, on Sat and Sunday (Nov 7 & 8). To be on safe side, we went ahead and were tested today (Nov. 12) for the virus. (results in 3-7 days).  Thought that I should advise you that I had an appointment with you on [place]- Nov 9.  My wife and I have had no symptoms of the virus.  ---I had an X-Ray on the 9th and have turned in 2 samples of mucus to the lab on [address]/   Thanks   [employer]

## 2019-04-14 NOTE — Telephone Encounter (Signed)
Patient never returned call, encounter closed 

## 2019-05-18 ENCOUNTER — Ambulatory Visit: Payer: Medicare Other | Admitting: Pulmonary Disease

## 2019-05-22 LAB — MYCOBACTERIA,CULT W/FLUOROCHROME SMEAR
MICRO NUMBER:: 1084162
SMEAR:: NONE SEEN
SPECIMEN QUALITY:: ADEQUATE

## 2019-05-22 LAB — RESPIRATORY CULTURE OR RESPIRATORY AND SPUTUM CULTURE
MICRO NUMBER:: 1084163
RESULT:: NORMAL
SPECIMEN QUALITY:: ADEQUATE

## 2019-05-22 LAB — EXTRA

## 2019-05-26 NOTE — Progress Notes (Signed)
@Patient  ID: Charles Golden, male    DOB: 06-21-1925, 85 y.o.   MRN: [id number]  Chief Complaint  Patient presents with  . Follow-up    1 month f/u. States his breathing has been ok since last visit.     Referring provider: Baruch Gouty, FNP  HPI:  85 year old male former smoker followed in our office for bronchiectasis, COPD and emphysema  PMH: History of chronic sinusitis, IBS, headache, GERD, restless leg, vitamin D deficiency, weakness Smoker/ Smoking History: Former smoker.  Quit 2008.  30-pack-year smoking history. Maintenance: Breo Ellipta 200 Pt of: Dr. Halford Chessman   05/27/2019  - Visit   85 year old male former smoker followed in our office for bronchiectasis, COPD and emphysema.  Patient completing a 4-week follow-up with our office today after establishing care with Dr. Halford Chessman.  Patient is a former Chief Operating Officer patient.  At last office visit patient's chest x-ray showed no acute findings.  Patient also had sputum that was tested which did not show any acute abnormalities.  Patient has still not started his flutter valve as instructed.  Patient also is confused regarding how to use his hypertonic saline.  Patient reports adherence to his Breo Ellipta 200.  Patient reports that he feels about the same.  His persisting complaint is that he feels that he is still having worsened fatigue.  He has been working with primary care regarding this.  He has follow-up with them in January/2021.   Tests:  A1AT 04/11/15 >> 153, MM PFT 07/13/15 >> FEV1 2.40 (92%), FEV1% 66, TLC 8.07 (112%), DLCO 75%, +BD CT chest 04/19/15 >> mild cylindrical BTX in bases, mild centrilobular emphysema, calcified mediastinal LN PSG 11/15/16 >> AHI 3.6  FENO:  No results found for: NITRICOXIDE  PFT: PFT Results Latest Ref Rng & Units 12/20/2015 07/13/2015  FVC-Pre L 3.41 3.63  FVC-Predicted Pre % 91 96  FVC-Post L 3.50 3.97  FVC-Predicted Post % 93 105  Pre FEV1/FVC % % 65 66  Post FEV1/FCV % % 69 68   FEV1-Pre L 2.21 2.40  FEV1-Predicted Pre % 85 92  FEV1-Post L 2.43 2.71  DLCO UNC% % - 70  DLCO COR %Predicted % - 74  TLC L - 8.07  TLC % Predicted % - 112  RV % Predicted % - 143    WALK:  SIX MIN WALK 02/20/2018 12/21/2015 07/13/2015  Medications - Zonegran None  Supplimental Oxygen during Test? (L/min) No No No  Laps - 7 7  Partial Lap (in Meters) - 24 24  Baseline BP (sitting) - 104/72 120/70  Baseline Heartrate - 67 76  Baseline Dyspnea (Borg Scale) - 2 1  Baseline Fatigue (Borg Scale) - 2 3  Baseline SPO2 - 99 97  BP (sitting) - 120/78 122/74  Heartrate - 84 87  Dyspnea (Borg Scale) - 4 1  Fatigue (Borg Scale) - 4 3  SPO2 - 97 97  BP (sitting) - 110/70 122/70  Heartrate - 76 77  SPO2 - 99 98  Stopped or Paused before Six Minutes - No No  Interpretation - Hip pain Hip pain;Leg pain;Calf pain  Distance Completed - 360 360  Tech Comments: steady walk, no desat, gait was off TA/CMA - -    Imaging: No results found.  Lab Results:  CBC    Component Value Date/Time   WBC 7.6 01/21/2019 1637   WBC 7.5 08/23/2014 0937   WBC 8.2 07/16/2014 1400   RBC 4.56 01/21/2019 1637   RBC  4.68 (A) 08/23/2014 0937   RBC 4.50 07/16/2014 1400   HGB 13.6 01/21/2019 1637   HCT 41.9 01/21/2019 1637   PLT 250 01/21/2019 1637   MCV 92 01/21/2019 1637   MCH 29.8 01/21/2019 1637   MCH 28.4 08/23/2014 0937   MCH 30.4 07/16/2014 1400   MCHC 32.5 01/21/2019 1637   MCHC 30.9 (A) 08/23/2014 0937   MCHC 31.9 07/16/2014 1400   RDW 12.3 01/21/2019 1637   LYMPHSABS 2.0 01/21/2019 1637   MONOABS 0.6 09/09/2013 1031   EOSABS 0.3 01/21/2019 1637   BASOSABS 0.1 01/21/2019 1637    BMET    Component Value Date/Time   NA 142 01/21/2019 1637   K 5.1 01/21/2019 1637   CL 104 01/21/2019 1637   CO2 23 01/21/2019 1637   GLUCOSE 106 (H) 01/21/2019 1637   GLUCOSE 108 (H) 07/16/2014 1400   BUN 23 01/21/2019 1637   CREATININE 1.53 (H) 01/21/2019 1637   CREATININE 1.36 (H) 09/25/2012 1108    CALCIUM 9.1 01/21/2019 1637   GFRNONAA 39 (L) 01/21/2019 1637   GFRNONAA 47 (L) 09/25/2012 1108   GFRAA 45 (L) 01/21/2019 1637   GFRAA 54 (L) 09/25/2012 1108    BNP    Component Value Date/Time   BNP 80.1 01/21/2019 1637    ProBNP No results found for: PROBNP  Specialty Problems      Pulmonary Problems   Lung nodules    2 sub-cm nodules in lingula      Chronic rhinosinusitis   Obstructive chronic bronchitis without exacerbation (HCC)    Spirometry 06/22/2013: FEV1 77% predicted FVC 89% predicted FEV1 to FVC ratio 64% predicted      Bronchiectasis with (acute) exacerbation (HCC)   COPD GOLD I     12/20/15: FVC 3.41 L (91%) FEV1 2.21 L (85%) FEV1/FVC 0.65 FEF 25-75 1.09 L (68%) negative bronchodilator response 07/13/15: FVC 3.63 L (96%) FEV1 2.40 L (92%) FEV1/FVC 0.66 FEF 25-75 1.17 L (71%) positive bronchodilator response TLC 8.07 L (112%) RV 143% ERV 106% DLCO corrected 75%  06/22/13: FVC 3.74 L (89%) FEV1 2.39 L (77%) FEV1/FVC 0.64 FEF 25-75 1.17 L (46%)            Upper airway cough syndrome   Deviated nasal septum   Bronchiectasis without complication (HCC)      Allergies  Allergen Reactions  . Demerol [Meperidine] Other (See Comments)    Pt. States "he woke up during a colonoscopy"  . Spiriva Handihaler [Tiotropium Bromide Monohydrate] Other (See Comments)    Mild Urinary Retention    Immunization History  Administered Date(s) Administered  . Fluad Quad(high Dose 65+) 03/04/2019  . Influenza, High Dose Seasonal PF 03/13/2016, 03/05/2017, 03/07/2018  . Influenza,inj,Quad PF,6+ Mos 03/20/2013, 03/08/2014, 03/15/2015  . Pneumococcal Conjugate-13 12/08/2013  . Pneumococcal Polysaccharide-23 05/28/1997  . Td 05/29/2007  . Tdap 11/05/2016  . Zoster 05/07/2006  . Zoster Recombinat (Shingrix) 05/05/2018, 07/17/2018    Past Medical History:  Diagnosis Date  . Bronchitis, chronic ([place])   . COPD (chronic obstructive pulmonary disease) ([place])   .  Enlarged prostate   . GERD (gastroesophageal reflux disease)   . Glaucoma   . Glucagonoma   . Hernia, incisional    at present  . HOH (hard of hearing)   . IBS (irritable bowel syndrome)   . Sciatic pain   . Sinus congestion     Tobacco History: Social History   Tobacco Use  Smoking Status Former Smoker  . Packs/day: 1.00  .  Years: 30.00  . Pack years: 30.00  . Types: Cigarettes  . Quit date: 05/28/2006  . Years since quitting: 13.0  Smokeless Tobacco Never Used   Counseling given: Yes   Continue to not smoke  Outpatient Encounter Medications as of 05/27/2019  Medication Sig  . albuterol (PROVENTIL) (2.5 MG/3ML) 0.083% nebulizer solution Take 3 mLs (2.5 mg total) by nebulization every 6 (six) hours as needed for wheezing or shortness of breath.  . Albuterol Sulfate 2.5 MG/0.5ML NEBU SMARTSIG:1 Vial(s) Via Nebulizer Every 6 Hours PRN  . Azelastine HCl 137 MCG/SPRAY SOLN PLACE 1 SPRAY INTO THE NOSE 1 DAY OR 1 DOSE. USE IN EACH NOSTRIL AS DIRECTED  . BREO ELLIPTA 200-25 MCG/INH AEPB Inhale 1 puff into the lungs daily.  . Cholecalciferol (VITAMIN D3) 2000 UNITS TABS Take 1 tablet by mouth daily.  . Cyanocobalamin (VITAMIN B-12) 5000 MCG SUBL Place 1 tablet under the tongue daily.  . finasteride (PROSCAR) 5 MG tablet Take 1 tablet (5 mg total) by mouth daily.  . fluticasone (FLONASE) 50 MCG/ACT nasal spray 1 SPRAY IN EACH NOSTRIL ONCE A DAY  . guaiFENesin (MUCINEX) 600 MG 12 hr tablet Take 600 mg by mouth as needed. Reported on 05/25/2015  . latanoprost (XALATAN) 0.005 % ophthalmic solution Place 1 drop into the left eye at bedtime.  Marland Kitchen Respiratory Therapy Supplies (FLUTTER) DEVI 1 Device by Other route as needed.  . sodium chloride HYPERTONIC 3 % nebulizer solution Take by nebulization 2 (two) times daily.  . tamsulosin (FLOMAX) 0.4 MG CAPS capsule Take 0.4 mg by mouth daily.  . timolol (TIMOPTIC) 0.5 % ophthalmic solution Place 1 drop into the left eye every morning.  .  [DISCONTINUED] traMADol (ULTRAM) 50 MG tablet Take 1 tablet (50 mg total) by mouth every 6 (six) hours as needed.   No facility-administered encounter medications on file as of 05/27/2019.     Review of Systems  Review of Systems  Constitutional: Positive for fatigue. Negative for activity change, chills, fever and unexpected weight change.  HENT: Negative for postnasal drip, rhinorrhea, sinus pressure, sinus pain and sore throat.   Eyes: Negative.   Respiratory: Positive for shortness of breath. Negative for cough and wheezing.   Cardiovascular: Negative for chest pain and palpitations.  Endocrine: Negative.   Genitourinary: Negative.   Musculoskeletal: Negative.   Skin: Negative.   Neurological: Negative for dizziness and headaches.  Psychiatric/Behavioral: Negative.  Negative for dysphoric mood. The patient is not nervous/anxious.   All other systems reviewed and are negative.    Physical Exam  BP 114/70 (BP Location: Left Arm, Patient Position: Sitting, Cuff Size: Normal)   Pulse 74   Temp (!) 97.2 F (36.2 C) (Temporal)   Ht 5' 10.5" (1.791 m)   Wt 172 lb (78 kg)   SpO2 98% Comment: on RA  BMI 24.33 kg/m   Wt Readings from Last 5 Encounters:  05/27/19 172 lb (78 kg)  04/06/19 172 lb 9.6 oz (78.3 kg)  03/04/19 171 lb (77.6 kg)  02/05/19 170 lb 6.4 oz (77.3 kg)  01/21/19 171 lb (77.6 kg)    BMI Readings from Last 5 Encounters:  05/27/19 24.33 kg/m  04/06/19 24.42 kg/m  03/04/19 24.19 kg/m  02/05/19 24.10 kg/m  01/21/19 24.19 kg/m     Physical Exam Vitals and nursing note reviewed.  Constitutional:      General: He is not in acute distress.    Appearance: Normal appearance. He is obese.  HENT:  Head: Normocephalic and atraumatic.     Right Ear: Hearing, tympanic membrane, ear canal and external ear normal.     Left Ear: Hearing, tympanic membrane, ear canal and external ear normal.     Nose: Rhinorrhea present. No mucosal edema or congestion.      Right Turbinates: Not enlarged.     Left Turbinates: Not enlarged.     Mouth/Throat:     Mouth: Mucous membranes are dry.     Pharynx: Oropharynx is clear. No oropharyngeal exudate.  Eyes:     Pupils: Pupils are equal, round, and reactive to light.  Cardiovascular:     Rate and Rhythm: Normal rate and regular rhythm.     Pulses: Normal pulses.     Heart sounds: Normal heart sounds. No murmur.  Pulmonary:     Effort: Pulmonary effort is normal.     Breath sounds: Normal breath sounds. No decreased breath sounds, wheezing or rales.  Musculoskeletal:     Cervical back: Normal range of motion.     Right lower leg: No edema.     Left lower leg: No edema.  Lymphadenopathy:     Cervical: No cervical adenopathy.  Skin:    General: Skin is warm and dry.     Capillary Refill: Capillary refill takes less than 2 seconds.     Findings: No erythema or rash.  Neurological:     General: No focal deficit present.     Mental Status: He is alert and oriented to person, place, and time.     Motor: No weakness.     Coordination: Coordination normal.     Gait: Gait is intact. Gait normal.  Psychiatric:        Mood and Affect: Mood normal.        Behavior: Behavior normal. Behavior is cooperative.        Thought Content: Thought content normal.        Judgment: Judgment normal.       Assessment & Plan:   Bronchiectasis without complication (HCC) Plan: Restart flutter valve use -twice daily Restart hypertonic saline prior to flutter valve use Consider reculturing sputum if sputum becomes discolored  Chronic rhinosinusitis History of Likely large component of flares  Plan: Continue Astelin spray Continue fluticasone nasal spray Can start nasal saline rinses twice daily  COPD GOLD I  Plan: Continue Breo Ellipta 200 Continue albuterol nebulized meds as needed for shortness of breath or wheezing Continue hypertonic saline nebs twice daily Continue flutter valve Follow-up in 2  months  Fatigue Plan: Continue to work with primary care regarding your fatigue    Return in about 2 months (around 07/26/2019), or if symptoms worsen or fail to improve, for Follow up with Dr. Halford Chessman.   Lauraine Rinne, NP 05/27/2019   This appointment was 32 minutes long with over 50% of the time in direct face-to-face patient care, assessment, plan of care, and follow-up.

## 2019-05-27 ENCOUNTER — Other Ambulatory Visit: Payer: Self-pay

## 2019-05-27 ENCOUNTER — Ambulatory Visit (INDEPENDENT_AMBULATORY_CARE_PROVIDER_SITE_OTHER): Payer: Medicare Other | Admitting: Pulmonary Disease

## 2019-05-27 ENCOUNTER — Encounter: Payer: Self-pay | Admitting: Pulmonary Disease

## 2019-05-27 VITALS — BP 114/70 | HR 74 | Temp 97.2°F | Ht 70.5 in | Wt 172.0 lb

## 2019-05-27 DIAGNOSIS — J479 Bronchiectasis, uncomplicated: Secondary | ICD-10-CM | POA: Diagnosis not present

## 2019-05-27 DIAGNOSIS — J449 Chronic obstructive pulmonary disease, unspecified: Secondary | ICD-10-CM

## 2019-05-27 DIAGNOSIS — R5383 Other fatigue: Secondary | ICD-10-CM | POA: Diagnosis not present

## 2019-05-27 DIAGNOSIS — J324 Chronic pansinusitis: Secondary | ICD-10-CM | POA: Diagnosis not present

## 2019-05-27 DIAGNOSIS — J471 Bronchiectasis with (acute) exacerbation: Secondary | ICD-10-CM | POA: Insufficient documentation

## 2019-05-27 NOTE — Assessment & Plan Note (Signed)
History of Likely large component of flares  Plan: Continue Astelin spray Continue fluticasone nasal spray Can start nasal saline rinses twice daily

## 2019-05-27 NOTE — Patient Instructions (Signed)
You were seen today by Lauraine Rinne, NP  for:   1. Bronchiectasis without complication ([place])  Take your hypertonic saline twice daily, do this no matter what Use your flutter valve after the hypertonic saline  Bronchiectasis: This is the medical term which indicates that you have damage, dilated airways making you more susceptible to respiratory infection. Use a flutter valve 10 breaths twice a day or 4 to 5 breaths 4-5 times a day to help clear mucus out Let us know if you have cough with change in mucus color or fevers or chills.  At that point you would need an antibiotic. Maintain a healthy nutritious diet, eating whole foods Take your medications as prescribed    2. COPD GOLD I   Breo Ellipta 200 >>> Take 1 puff daily in the morning right when you wake up >>>Rinse your mouth out after use >>>This is a daily maintenance inhaler, NOT a rescue inhaler >>>Contact our office if you are having difficulties affording or obtaining this medication >>>It is important for you to be able to take this daily and not miss any doses   Can use your albuterol nebulized meds every 6-8 hours as needed for shortness of breath or wheezing  Note your daily symptoms > remember "red flags" for COPD:   >>>Increase in cough >>>increase in sputum production >>>increase in shortness of breath or activity  intolerance.   If you notice these symptoms, please call the office to be seen.   3. Chronic pansinusitis /allergic rhinitis  Continue Astelin nasal spray Continue Flonase nasal spray  Can consider starting nasal saline rinses  4. Fatigue, unspecified type  Continue to follow-up with primary care regarding persistent fatigue    Follow Up:    Return in about 2 months (around 07/26/2019), or if symptoms worsen or fail to improve, for Follow up with Dr. Halford Chessman.   Please do your part to reduce the spread of COVID-19:      Reduce your risk of any infection  and COVID19 by using the similar  precautions used for avoiding the common cold or flu:  Marland Kitchen Wash your hands often with soap and warm water for at least 20 seconds.  If soap and water are not readily available, use an alcohol-based hand sanitizer with at least 60% alcohol.  . If coughing or sneezing, cover your mouth and nose by coughing or sneezing into the elbow areas of your shirt or coat, into a tissue or into your sleeve (not your hands). Langley Gauss A MASK when in public  . Avoid shaking hands with others and consider head nods or verbal greetings only. . Avoid touching your eyes, nose, or mouth with unwashed hands.  . Avoid close contact with people who are sick. . Avoid places or events with large numbers of people in one location, like concerts or sporting events. . If you have some symptoms but not all symptoms, continue to monitor at home and seek medical attention if your symptoms worsen. . If you are having a medical emergency, call 911.   [place] / e-Visit: [email or web address]         [facility]: [phone]  [facility]: [id number]                   [facility]: [id number]     It is flu season:   >>> Best ways to protect herself from the flu: Receive  the yearly flu vaccine, practice good hand hygiene washing with soap and also using hand sanitizer when available, eat a nutritious meals, get adequate rest, hydrate appropriately   Please contact the office if your symptoms worsen or you have concerns that you are not improving.   Thank you for choosing [facility] for your healthcare, and for allowing [place] to partner with you on your healthcare journey. I am thankful to be able to provide care to you today.   Wyn Quaker FNP-C

## 2019-05-27 NOTE — Assessment & Plan Note (Signed)
Plan: Restart flutter valve use -twice daily Restart hypertonic saline prior to flutter valve use Consider reculturing sputum if sputum becomes discolored

## 2019-05-27 NOTE — Assessment & Plan Note (Signed)
Plan: Continue to work with primary care regarding your fatigue

## 2019-05-27 NOTE — Assessment & Plan Note (Signed)
Plan: Continue Breo Ellipta 200 Continue albuterol nebulized meds as needed for shortness of breath or wheezing Continue hypertonic saline nebs twice daily Continue flutter valve Follow-up in 2 months

## 2019-06-04 ENCOUNTER — Ambulatory Visit (INDEPENDENT_AMBULATORY_CARE_PROVIDER_SITE_OTHER): Payer: Medicare Other | Admitting: Family Medicine

## 2019-06-04 ENCOUNTER — Encounter: Payer: Self-pay | Admitting: Family Medicine

## 2019-06-04 DIAGNOSIS — J479 Bronchiectasis, uncomplicated: Secondary | ICD-10-CM | POA: Diagnosis not present

## 2019-06-04 DIAGNOSIS — R351 Nocturia: Secondary | ICD-10-CM | POA: Diagnosis not present

## 2019-06-04 DIAGNOSIS — J449 Chronic obstructive pulmonary disease, unspecified: Secondary | ICD-10-CM | POA: Diagnosis not present

## 2019-06-04 DIAGNOSIS — R54 Age-related physical debility: Secondary | ICD-10-CM

## 2019-06-04 DIAGNOSIS — I700 Atherosclerosis of aorta: Secondary | ICD-10-CM

## 2019-06-04 DIAGNOSIS — N401 Enlarged prostate with lower urinary tract symptoms: Secondary | ICD-10-CM

## 2019-06-04 DIAGNOSIS — E559 Vitamin D deficiency, unspecified: Secondary | ICD-10-CM | POA: Diagnosis not present

## 2019-06-04 NOTE — Progress Notes (Signed)
Virtual Visit via telephone Note Due to COVID-19 pandemic this visit was conducted virtually. This visit type was conducted due to national recommendations for restrictions regarding the COVID-19 Pandemic (e.g. social distancing, sheltering in place) in an effort to limit this patient's exposure and mitigate transmission in our community. All issues noted in this document were discussed and addressed.  A physical exam was not performed with this format.   I connected with Charles Golden on 06/04/2019 at [facility] by telephone and verified that I am speaking with the correct person using two identifiers. Charles Golden is currently located at home and no one is currently with them during visit. The provider, [name], FNP is located in their office at time of visit.  I discussed the limitations, risks, security and privacy concerns of performing an evaluation and management service by telephone and the availability of in person appointments. I also discussed with the patient that there may be a patient responsible charge related to this service. The patient expressed understanding and agreed to proceed.  Subjective:  Patient ID: Charles Golden, male    DOB: 13-Apr-1926, 86 y.o.   MRN: [id number]  Chief Complaint:  Medical Management of Chronic Issues   HPI: Charles Golden is a 86 y.o. male presenting on 06/04/2019 for Medical Management of Chronic Issues  1. Obstructive chronic bronchitis without exacerbation ([place]) 2. Bronchiectasis without complication ([place]) Pt reports symptoms have been well controlled. He was recently seen by pulmonology and was given a good report. No changes on chest xray and negative sputum studies. Pt is compliant with treatment regimen. Does stay tired all of the time and feels this is worsening slightly. This is not associated with exertion. States he feels fatigues all of the time.    3. Vitamin D deficiency Pt is taking oral repletion therapy. Denies bone pain and  tenderness, muscle weakness, fracture, and difficulty walking. Lab Results  Component Value Date   VD25OH 54.7 01/21/2019   VD25OH 58.0 08/05/2018   VD25OH 56.4 03/28/2018   Lab Results  Component Value Date   CALCIUM 9.1 01/21/2019    4. Advanced age Pt is 86 and is having a difficult time accepting his aging. He reports ongoing fatigue that does not seem to be improving. Lab work has been unremarkable for underlying causes. He has chronic kidney disease that has been stable. He is sleeping well and tries to stay active. He is not drinking ensure or boost. He does not take a multivitamin daily.   5. Thoracic aortic atherosclerosis (HCC) Pt is not on ASA or statin therapy and does not wish to be due to age. He denies chest pain, abdominal pain, palpitations, dizziness, or syncope.   6. Benign prostatic hyperplasia with nocturia BPH symptoms are fairly controlled with finasteride and tamsulosin. Pt takes these medications nightly. He denies dizziness, lightheadedness, or syncope.      Relevant past medical, surgical, family, and social history reviewed and updated as indicated.  Allergies and medications reviewed and updated.   Past Medical History:  Diagnosis Date  . Bronchitis, chronic ([place])   . COPD (chronic obstructive pulmonary disease) ([place])   . Enlarged prostate   . GERD (gastroesophageal reflux disease)   . Glaucoma   . Glucagonoma   . Hernia, incisional    at present  . HOH (hard of hearing)   . IBS (irritable bowel syndrome)   . Sciatic pain   . Sinus congestion     Past Surgical History:  Procedure Laterality Date  . BOWEL RESECTION N/A 09/04/2012   Procedure: SMALL BOWEL RESECTION;  Surgeon: Donato Heinz, MD;  Location: [facility];  Service: General;  Laterality: N/A;  Anastimosis  . HEMORRHOID SURGERY    . HERNIA REPAIR Bilateral 70's  . INGUINAL HERNIA REPAIR Left 09/14/2013   Procedure: LEFT INGUINAL HERNIORRHAPHY;  Surgeon: Jamesetta So, MD;  Location:  [facility];  Service: General;  Laterality: Left;  . INGUINAL HERNIA REPAIR Right 12/23/2018   Procedure: RECURRENT RIGHT INGUINAL HERNIA  REPAIR  WITH MESH;  Surgeon: Aviva Signs, MD;  Location: [facility];  Service: General;  Laterality: Right;  . INSERTION OF MESH Left 09/14/2013   Procedure: INSERTION OF MESH;  Surgeon: Jamesetta So, MD;  Location: [facility];  Service: General;  Laterality: Left;  . KNEE ARTHROSCOPY Left 07/21/2014   Procedure: LEFT KNEE ARTHROSCOPY WITH MEDIAL MENISCAL DEBRIDEMENT ;  Surgeon: Gearlean Alf, MD;  Location: [facility];  Service: Orthopedics;  Laterality: Left;  . LAPAROTOMY N/A 09/04/2012   Procedure: EXPLORATORY LAPAROTOMY;  Surgeon: Donato Heinz, MD;  Location: [facility];  Service: General;  Laterality: N/A;  . SKIN LESION EXCISION     [name]  . TONSILLECTOMY      Social History   Socioeconomic History  . Marital status: Married    Spouse name: [name]  . Number of children: 3  . Years of education: 12+  . Highest education level: Bachelor's degree (e.g., BA, AB, BS)  Occupational History  . Occupation: Retired    Comment: Immunologist  Tobacco Use  . Smoking status: Former Smoker    Packs/day: 1.00    Years: 30.00    Pack years: 30.00    Types: Cigarettes    Quit date: 05/28/2006    Years since quitting: 13.0  . Smokeless tobacco: Never Used  Substance and Sexual Activity  . Alcohol use: Not Currently    Alcohol/week: 0.0 standard drinks  . Drug use: No  . Sexual activity: Yes    Birth control/protection: None  Other Topics Concern  . Not on file  Social History Narrative   Patient lives at home with his wife [name].    Patient has 3 children.    Patient has his BS   Patient is retired.    Drinks about 2 cups of coffee per day.      [facility]:   He is still married. 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 He is from [place].       Social Determinants  of Health   Financial Resource Strain:   . Difficulty of Paying Living Expenses: Not on file  Food Insecurity:   . Worried About Charity fundraiser in the Last Year: Not on file  . Ran Out of Food in the Last Year: Not on file  Transportation Needs:   . Lack of Transportation (Medical): Not on file  . Lack of Transportation (Non-Medical): Not on file  Physical Activity:   . Days of Exercise per Week: Not on file  . Minutes of Exercise per Session: Not on file  Stress:   . Feeling of Stress : Not on file  Social Connections:   . Frequency of Communication with Friends and Family: Not on file  . Frequency of Social Gatherings with Friends and Family: Not on file  . Attends Religious Services: Not on file  . Active Member of Clubs  or Organizations: Not on file  . Attends Archivist Meetings: Not on file  . Marital Status: Not on file  Intimate Partner Violence:   . Fear of Current or Ex-Partner: Not on file  . Emotionally Abused: Not on file  . Physically Abused: Not on file  . Sexually Abused: Not on file    Outpatient Encounter Medications as of 06/04/2019  Medication Sig  . albuterol (PROVENTIL) (2.5 MG/3ML) 0.083% nebulizer solution Take 3 mLs (2.5 mg total) by nebulization every 6 (six) hours as needed for wheezing or shortness of breath.  . Albuterol Sulfate 2.5 MG/0.5ML NEBU SMARTSIG:1 Vial(s) Via Nebulizer Every 6 Hours PRN  . Azelastine HCl 137 MCG/SPRAY SOLN PLACE 1 SPRAY INTO THE NOSE 1 DAY OR 1 DOSE. USE IN EACH NOSTRIL AS DIRECTED  . BREO ELLIPTA 200-25 MCG/INH AEPB Inhale 1 puff into the lungs daily.  . Cholecalciferol (VITAMIN D3) 2000 UNITS TABS Take 1 tablet by mouth daily.  . Cyanocobalamin (VITAMIN B-12) 5000 MCG SUBL Place 1 tablet under the tongue daily.  . finasteride (PROSCAR) 5 MG tablet Take 1 tablet (5 mg total) by mouth daily.  . fluticasone (FLONASE) 50 MCG/ACT nasal spray 1 SPRAY IN EACH NOSTRIL ONCE A DAY  . guaiFENesin (MUCINEX) 600 MG 12 hr  tablet Take 600 mg by mouth as needed. Reported on 05/25/2015  . latanoprost (XALATAN) 0.005 % ophthalmic solution Place 1 drop into the left eye at bedtime.  Marland Kitchen Respiratory Therapy Supplies (FLUTTER) DEVI 1 Device by Other route as needed.  . sodium chloride HYPERTONIC 3 % nebulizer solution Take by nebulization 2 (two) times daily.  . tamsulosin (FLOMAX) 0.4 MG CAPS capsule Take 0.4 mg by mouth daily.  . timolol (TIMOPTIC) 0.5 % ophthalmic solution Place 1 drop into the left eye every morning.   No facility-administered encounter medications on file as of 06/04/2019.    Allergies  Allergen Reactions  . Demerol [Meperidine] Other (See Comments)    Pt. States "he woke up during a colonoscopy"  . Spiriva Handihaler [Tiotropium Bromide Monohydrate] Other (See Comments)    Mild Urinary Retention    Review of Systems  Constitutional: Positive for fatigue. Negative for activity change, appetite change, chills, diaphoresis, fever and unexpected weight change.  HENT: Negative.   Eyes: Negative.  Negative for photophobia and visual disturbance.  Respiratory: Positive for cough (chronic) and shortness of breath (chronic). Negative for chest tightness and wheezing.   Cardiovascular: Negative for chest pain, palpitations and leg swelling.  Gastrointestinal: Negative for abdominal pain, blood in stool, constipation, diarrhea, nausea and vomiting.  Endocrine: Negative.   Genitourinary: Negative for decreased urine volume, difficulty urinating, dysuria, flank pain, frequency, hematuria, penile pain, penile swelling, scrotal swelling, testicular pain and urgency.  Musculoskeletal: Negative for arthralgias, back pain and myalgias.  Skin: Negative.  Negative for color change and pallor.  Allergic/Immunologic: Negative.   Neurological: Positive for weakness (generalized). Negative for dizziness, syncope, light-headedness and headaches.  Hematological: Negative.   Psychiatric/Behavioral: Negative for  confusion, hallucinations, sleep disturbance and suicidal ideas.  All other systems reviewed and are negative.        Observations/Objective: No vital signs or physical exam, this was a telephone or virtual health encounter.  Pt alert and oriented, answers all questions appropriately, and able to speak in full sentences.    Assessment and Plan: Charles Golden was seen today for medical management of chronic issues.  Diagnoses and all orders for this visit:  Obstructive chronic bronchitis without exacerbation ([place])  Bronchiectasis without complication ([place]) Doing well on current regimen. Is followed by pulmonology. Pt will continue to follow up with pulmonology   Vitamin D deficiency Continue repletion therapy. If indicated, will change repletion dosage. Eat foods rich in Vit D including milk, orange juice, yogurt with vitamin D added, salmon or mackerel, canned tuna fish, cereals with vitamin D added, and cod liver oil. Get out in the sun but make sure to wear at least SPF 30 sunscreen.   Advanced age Pt having a difficult time adjusting to aging. Long discussion today about the aging process and symptoms that go along with that process. Pt reluctant to believe his symptoms are related to age and wishes to try to feel more energetic. Pt states he is going to start taking a multivitamin daily to see if beneficial.   Thoracic aortic atherosclerosis ([place]) Not on statin or ASA therapy and does not wish to start due to age.   Benign prostatic hyperplasia with nocturia Well controlled with current regimen, will continue.     Follow Up Instructions: Return in about 3 months (around 09/02/2019), or if symptoms worsen or fail to improve.    I discussed the assessment and treatment plan with the patient. The patient was provided an opportunity to ask questions and all were answered. The patient agreed with the plan and demonstrated an understanding of the instructions.   The patient was advised to  call back or seek an in-person evaluation if the symptoms worsen or if the condition fails to improve as anticipated.  The above assessment and management plan was discussed with the patient. The patient verbalized understanding of and has agreed to the management plan. Patient is aware to call the clinic if they develop any new symptoms or if symptoms persist or worsen. Patient is aware when to return to the clinic for a follow-up visit. Patient educated on when it is appropriate to go to the emergency department.    I provided 25 minutes of non-face-to-face time during this encounter. The call started at 0915. The call ended at [place]. The other time was used for coordination of care.    [name], FNP-C [facility] [address] [place], [place] [address] [phone] 06/04/2019

## 2019-06-09 ENCOUNTER — Ambulatory Visit (INDEPENDENT_AMBULATORY_CARE_PROVIDER_SITE_OTHER): Payer: Medicare Other | Admitting: *Deleted

## 2019-06-09 DIAGNOSIS — Z0000 Encounter for general adult medical examination without abnormal findings: Secondary | ICD-10-CM

## 2019-06-09 NOTE — Progress Notes (Signed)
MEDICARE ANNUAL WELLNESS VISIT  06/09/2019  Telephone Visit Disclaimer This Medicare AWV was conducted by telephone due to national recommendations for restrictions regarding the COVID-19 Pandemic (e.g. social distancing).  I verified, using two identifiers, that I am speaking with Charles Golden or their authorized healthcare agent. I discussed the limitations, risks, security, and privacy concerns of performing an evaluation and management service by telephone and the potential availability of an in-person appointment in the future. The patient expressed understanding and agreed to proceed.   Subjective:  Charles Golden is a 86 y.o. male patient of Rakes, Connye Burkitt, FNP who had a Medicare Annual Wellness Visit today via telephone. Escar is Retired and lives with their spouse. he has 3 children. he reports that he is socially active and does interact with friends/family regularly. he is minimally physically active and enjoys history, watching Westerns on TV, genealogy, trying to stay active in [place] and spending time with his family .  Patient Care Team: Baruch Gouty, FNP as PCP - General (Family Medicine) Irine Seal, MD (Urology) Richmond Campbell, MD (Gastroenterology) Clent Jacks, MD (Ophthalmology) Gaynelle Arabian, MD as Consulting Physician (Orthopedic Surgery) Jodi Marble, MD as Consulting Physician (Otolaryngology) Juanito Doom, MD as Consulting Physician (Pulmonary Disease) Kathrynn Ducking, MD as Consulting Physician (Neurology)  Advanced Directives 06/09/2019 12/23/2018 12/18/2018 12/15/2018 05/05/2018 03/19/2017 12/20/2014  Does Patient Have a Medical Advance Directive? Yes Yes Yes Yes Yes No Yes  Type of Paramedic of [place];Living will Healthcare Power of [place];Living will [place];Living will [place];Living will - [place]  Does patient want to make  changes to medical advance directive? No - Patient declined No - Patient declined Yes (MAU/Ambulatory/Procedural Areas - Information given) - No - Patient declined - -  Copy of [place] in Chart? No - copy requested - - - No - copy requested - -  Would patient like information on creating a medical advance directive? - - - - - Yes (MAU/Ambulatory/Procedural Areas - Information given) -  Pre-existing out of facility DNR order (yellow form or pink MOST form) - - - - - - -    Hospital Utilization Over the Past 12 Months: # of hospitalizations or ER visits: 0 # of surgeries: 1  Review of Systems    Patient reports that his overall health is unchanged compared to last year.  History obtained from chart review  Patient Reported Readings (BP, Pulse, CBG, Weight, etc) none  Pain Assessment Pain : No/denies pain     Current Medications & Allergies (verified) Allergies as of 06/09/2019      Reactions   Demerol [meperidine] Other (See Comments)   Pt. States "he woke up during a colonoscopy"   Spiriva Handihaler [tiotropium Bromide Monohydrate] Other (See Comments)   Mild Urinary Retention      Medication List       Accurate as of June 09, 2019  9:00 AM. If you have any questions, ask your nurse or doctor.        albuterol (2.5 MG/3ML) 0.083% nebulizer solution Commonly known as: PROVENTIL Take 3 mLs (2.5 mg total) by nebulization every 6 (six) hours as needed for wheezing or shortness of breath.   Azelastine HCl 137 MCG/SPRAY Soln PLACE 1 SPRAY INTO THE NOSE 1 DAY OR 1 DOSE. USE IN EACH NOSTRIL AS DIRECTED   Breo Ellipta 200-25 MCG/INH Aepb Generic drug: fluticasone  furoate-vilanterol Inhale 1 puff into the lungs daily.   finasteride 5 MG tablet Commonly known as: PROSCAR Take 1 tablet (5 mg total) by mouth daily.   fluticasone 50 MCG/ACT nasal spray Commonly known as: FLONASE 1 SPRAY IN EACH NOSTRIL ONCE A DAY   Flutter Devi 1 Device by Other  route as needed.   guaiFENesin 600 MG 12 hr tablet Commonly known as: MUCINEX Take 600 mg by mouth as needed. Reported on 05/25/2015   latanoprost 0.005 % ophthalmic solution Commonly known as: XALATAN Place 1 drop into the left eye at bedtime.   sodium chloride HYPERTONIC 3 % nebulizer solution Take by nebulization 2 (two) times daily.   tamsulosin 0.4 MG Caps capsule Commonly known as: FLOMAX Take 0.4 mg by mouth daily.   timolol 0.5 % ophthalmic solution Commonly known as: TIMOPTIC Place 1 drop into the left eye every morning.   Vitamin B-12 5000 MCG Subl Place 1 tablet under the tongue daily.   Vitamin D3 50 MCG (2000 UT) Tabs Take 1 tablet by mouth daily.       History (reviewed): Past Medical History:  Diagnosis Date  . Bronchitis, chronic ([place])   . COPD (chronic obstructive pulmonary disease) ([place])   . Enlarged prostate   . GERD (gastroesophageal reflux disease)   . Glaucoma   . Glucagonoma   . Hernia, incisional    at present  . HOH (hard of hearing)   . IBS (irritable bowel syndrome)   . Sciatic pain   . Sinus congestion    Past Surgical History:  Procedure Laterality Date  . BOWEL RESECTION N/A 09/04/2012   Procedure: SMALL BOWEL RESECTION;  Surgeon: Donato Heinz, MD;  Location: [facility];  Service: General;  Laterality: N/A;  Anastimosis  . HEMORRHOID SURGERY    . HERNIA REPAIR Bilateral 70's  . INGUINAL HERNIA REPAIR Left 09/14/2013   Procedure: LEFT INGUINAL HERNIORRHAPHY;  Surgeon: Jamesetta So, MD;  Location: [facility];  Service: General;  Laterality: Left;  . INGUINAL HERNIA REPAIR Right 12/23/2018   Procedure: RECURRENT RIGHT INGUINAL HERNIA  REPAIR  WITH MESH;  Surgeon: Aviva Signs, MD;  Location: [facility];  Service: General;  Laterality: Right;  . INSERTION OF MESH Left 09/14/2013   Procedure: INSERTION OF MESH;  Surgeon: Jamesetta So, MD;  Location: [facility];  Service: General;  Laterality: Left;  . KNEE ARTHROSCOPY Left 07/21/2014   Procedure:  LEFT KNEE ARTHROSCOPY WITH MEDIAL MENISCAL DEBRIDEMENT ;  Surgeon: Gearlean Alf, MD;  Location: [facility];  Service: Orthopedics;  Laterality: Left;  . LAPAROTOMY N/A 09/04/2012   Procedure: EXPLORATORY LAPAROTOMY;  Surgeon: Donato Heinz, MD;  Location: [facility];  Service: General;  Laterality: N/A;  . SKIN LESION EXCISION     [name]  . TONSILLECTOMY     Family History  Problem Relation Age of Onset  . Heart disease Mother        Valve replacement and pacemaker  . Colon cancer Father   . Asthma Other        cousin  . Healthy Daughter   . Healthy Daughter   . Healthy Daughter    Social History   Socioeconomic History  . Marital status: Married    Spouse name: [name]  . Number of children: 3  . Years of education: 12+  . Highest education level: Bachelor's degree (e.g., BA, AB, BS)  Occupational History  . Occupation: Retired    Comment: Immunologist  Tobacco Use  .  Smoking status: Former Smoker    Packs/day: 1.00    Years: 30.00    Pack years: 30.00    Types: Cigarettes    Quit date: 05/28/2006    Years since quitting: 13.0  . Smokeless tobacco: Never Used  Substance and Sexual Activity  . Alcohol use: Not Currently    Alcohol/week: 0.0 standard drinks  . Drug use: No  . Sexual activity: Yes    Birth control/protection: None  Other Topics Concern  . Not on file  Social History Narrative   Patient lives at home with his wife [name].    Patient has 3 children.    Patient has his BS   Patient is retired.    Drinks about 2 cups of coffee per day.      [facility]:   He is still married. 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 He is from [place].       Social Determinants of Health   Financial Resource Strain: Low Risk   . Difficulty of Paying Living Expenses: Not hard at all  Food Insecurity: No Food Insecurity  . Worried About Charity fundraiser in the Last Year: Never  true  . Ran Out of Food in the Last Year: Never true  Transportation Needs: No Transportation Needs  . Lack of Transportation (Medical): No  . Lack of Transportation (Non-Medical): No  Physical Activity: Inactive  . Days of Exercise per Week: 0 days  . Minutes of Exercise per Session: 0 min  Stress: No Stress Concern Present  . Feeling of Stress : Not at all  Social Connections: Not Isolated  . Frequency of Communication with Friends and Family: More than three times a week  . Frequency of Social Gatherings with Friends and Family: More than three times a week  . Attends Religious Services: More than 4 times per year  . Active Member of Clubs or Organizations: Yes  . Attends Archivist Meetings: More than 4 times per year  . Marital Status: Married    Activities of Daily Living In your present state of health, do you have any difficulty performing the following activities: 06/09/2019 12/23/2018  Hearing? Y -  Comment wears bilateral hearing aids -  Vision? N -  Comment wears glasses all the time-gets yearly eye exam -  Difficulty concentrating or making decisions? N -  Walking or climbing stairs? N -  Comment pt states he can climb stairs but doesn't like to because he doesn't want to fall -  Dressing or bathing? N -  Comment - -  Doing errands, shopping? N N  Preparing Food and eating ? N -  Using the Toilet? N -  In the past six months, have you accidently leaked urine? N -  Do you have problems with loss of bowel control? N -  Managing your Medications? N -  Managing your Finances? N -  Housekeeping or managing your Housekeeping? N -  Some recent data might be hidden    Patient Education/ Literacy How often do you need to have someone help you when you read instructions, pamphlets, or other written materials from your doctor or pharmacy?: 1 - Never What is the last grade level you completed in school?: Bachelors Degree  Exercise Current Exercise Habits: The  patient does not participate in regular exercise at present, Exercise limited by: respiratory conditions(s)  Diet Patient reports consuming 3 meals a day  and 2 snack(s) a day Patient reports that his primary diet is: Regular Patient reports that she does have regular access to food.   Depression Screen PHQ 2/9 Scores 06/09/2019 03/04/2019 02/05/2019 01/21/2019 08/05/2018 05/05/2018 03/28/2018  PHQ - 2 Score 0 0 0 0 0 0 0  Exception Documentation - - - - - - -     Fall Risk Fall Risk  06/09/2019 03/04/2019 02/05/2019 01/21/2019 08/05/2018  Falls in the past year? 0 0 0 0 0  Comment - - - - -  Number falls in past yr: - - - - -  Injury with Fall? - - - - -  Follow up - - - - -     Objective:  Charles Golden seemed alert and oriented and he participated appropriately during our telephone visit.  Blood Pressure Weight BMI  BP Readings from Last 3 Encounters:  05/27/19 114/70  04/06/19 122/68  03/04/19 (!) 103/55   Wt Readings from Last 3 Encounters:  05/27/19 172 lb (78 kg)  04/06/19 172 lb 9.6 oz (78.3 kg)  03/04/19 171 lb (77.6 kg)   BMI Readings from Last 1 Encounters:  05/27/19 24.33 kg/m    *Unable to obtain current vital signs, weight, and BMI due to telephone visit type  Hearing/Vision  . Kobee did not seem to have difficulty with hearing/understanding during the telephone conversation . Reports that he has had a formal eye exam by an eye care professional within the past year . Reports that he has not had a formal hearing evaluation within the past year *Unable to fully assess hearing and vision during telephone visit type  Cognitive Function: 6CIT Screen 06/09/2019  What Year? 0 points  What month? 0 points  What time? 0 points  Count back from 20 0 points  Months in reverse 0 points  Repeat phrase 0 points  Total Score 0   (Normal:0-7, Significant for Dysfunction: >8)  Normal Cognitive Function Screening: Yes   Immunization & Health Maintenance  Record Immunization History  Administered Date(s) Administered  . Fluad Quad(high Dose 65+) 03/04/2019  . Influenza, High Dose Seasonal PF 03/13/2016, 03/05/2017, 03/07/2018  . Influenza,inj,Quad PF,6+ Mos 03/20/2013, 03/08/2014, 03/15/2015  . Pneumococcal Conjugate-13 12/08/2013  . Pneumococcal Polysaccharide-23 05/28/1997  . Td 05/29/2007  . Tdap 11/05/2016  . Zoster 05/07/2006  . Zoster Recombinat (Shingrix) 05/05/2018, 07/17/2018    Health Maintenance  Topic Date Due  . TETANUS/TDAP  11/06/2026  . INFLUENZA VACCINE  Completed  . PNA vac Low Risk Adult  Completed       Assessment  This is a routine wellness examination for Charles Golden.  Health Maintenance: Due or Overdue There are no preventive care reminders to display for this patient.  Charles Golden does not need a referral for Community Assistance: Care Management:   no Social Work:    no Prescription Assistance:  no Nutrition/Diabetes Education:  no   Plan:  Personalized Goals Goals Addressed            This Visit's Progress   . DIET - INCREASE WATER INTAKE       Try to drink 6-8 glasses of water daily      Personalized Health Maintenance & Screening Recommendations  Advanced directives: has an advanced directive - a copy HAS NOT been provided.  Lung Cancer Screening Recommended: no (Low Dose CT Chest recommended if Age 98-80 years, 30 pack-year currently smoking OR have quit w/in past 15 years) Hepatitis C Screening recommended: no  HIV Screening recommended: no  Advanced Directives: Written information was not prepared per patient's request.  Referrals & Orders No orders of the defined types were placed in this encounter.   Follow-up Plan . Follow-up with Baruch Gouty, FNP as planned . Bring a copy of your Advanced Directives in for our records   I have personally reviewed and noted the following in the patient's chart:   . Medical and social history . Use of alcohol, tobacco or illicit  drugs  . Current medications and supplements . Functional ability and status . Nutritional status . Physical activity . Advanced directives . List of other physicians . Hospitalizations, surgeries, and ER visits in previous 12 months . Vitals . Screenings to include cognitive, depression, and falls . Referrals and appointments  In addition, I have reviewed and discussed with Charles Golden certain preventive protocols, quality metrics, and best practice recommendations. A written personalized care plan for preventive services as well as general preventive health recommendations is available and can be mailed to the patient at his request.      Milas Hock, LPN  [phone]

## 2019-06-09 NOTE — Patient Instructions (Signed)

## 2019-06-19 DIAGNOSIS — Z23 Encounter for immunization: Secondary | ICD-10-CM | POA: Diagnosis not present

## 2019-07-17 DIAGNOSIS — Z23 Encounter for immunization: Secondary | ICD-10-CM | POA: Diagnosis not present

## 2019-07-24 ENCOUNTER — Ambulatory Visit: Payer: Medicare Other | Admitting: Pulmonary Disease

## 2019-08-11 ENCOUNTER — Encounter: Payer: Self-pay | Admitting: *Deleted

## 2019-08-18 ENCOUNTER — Telehealth: Payer: Self-pay | Admitting: Family Medicine

## 2019-08-18 NOTE — Chronic Care Management (AMB) (Signed)
  Chronic Care Management   Note  08/18/2019 Name: Charles Golden MRN: [id number] DOB: [birth year]  Charles Golden is a 86 y.o. year old male who is a primary care patient of Rakes, Connye Burkitt, FNP. I reached out to [name] by phone today in response to a referral sent by Charles Golden [facility] health plan.     [name] was given information about Chronic Care Management services today including:  1. CCM service includes personalized support from designated clinical staff supervised by his physician, including individualized plan of care and coordination with other care providers 2. 24/7 contact phone numbers for assistance for urgent and routine care needs. 3. Service will only be billed when office clinical staff spend 20 minutes or more in a month to coordinate care. 4. Only one practitioner may furnish and bill the service in a calendar month. 5. The patient may stop CCM services at any time (effective at the end of the month) by phone call to the office staff. 6. The patient will be responsible for cost sharing (co-pay) of up to 20% of the service fee (after annual deductible is met).  Patient agreed to services and verbal consent obtained.   Follow up plan: Telephone appointment with care management team member scheduled for:02/09/2020.  [place],  [id number] Direct Dial: [phone] [email or web address]: [email or web address]

## 2019-09-01 DIAGNOSIS — H6122 Impacted cerumen, left ear: Secondary | ICD-10-CM | POA: Diagnosis not present

## 2019-09-04 ENCOUNTER — Ambulatory Visit: Payer: Medicare Other | Admitting: Family Medicine

## 2019-09-04 ENCOUNTER — Other Ambulatory Visit: Payer: Self-pay

## 2019-09-04 ENCOUNTER — Ambulatory Visit (INDEPENDENT_AMBULATORY_CARE_PROVIDER_SITE_OTHER): Payer: Medicare Other | Admitting: Family Medicine

## 2019-09-04 ENCOUNTER — Encounter: Payer: Self-pay | Admitting: Family Medicine

## 2019-09-04 VITALS — BP 137/77 | HR 69 | Temp 98.0°F | Ht 70.5 in | Wt 177.2 lb

## 2019-09-04 DIAGNOSIS — N401 Enlarged prostate with lower urinary tract symptoms: Secondary | ICD-10-CM | POA: Diagnosis not present

## 2019-09-04 DIAGNOSIS — J449 Chronic obstructive pulmonary disease, unspecified: Secondary | ICD-10-CM

## 2019-09-04 DIAGNOSIS — E559 Vitamin D deficiency, unspecified: Secondary | ICD-10-CM

## 2019-09-04 DIAGNOSIS — R5383 Other fatigue: Secondary | ICD-10-CM

## 2019-09-04 DIAGNOSIS — R351 Nocturia: Secondary | ICD-10-CM | POA: Diagnosis not present

## 2019-09-04 DIAGNOSIS — R531 Weakness: Secondary | ICD-10-CM

## 2019-09-04 DIAGNOSIS — E538 Deficiency of other specified B group vitamins: Secondary | ICD-10-CM

## 2019-09-04 DIAGNOSIS — I700 Atherosclerosis of aorta: Secondary | ICD-10-CM

## 2019-09-04 MED ORDER — ALBUTEROL SULFATE HFA 108 (90 BASE) MCG/ACT IN AERS: 2.0000 | INHALATION_SPRAY | Freq: Four times a day (QID) | RESPIRATORY_TRACT | 11 refills | Status: DC | PRN

## 2019-09-04 MED ORDER — FINASTERIDE 5 MG PO TABS: 5.0000 mg | ORAL_TABLET | Freq: Every day | ORAL | 3 refills | Status: DC

## 2019-09-04 NOTE — Progress Notes (Signed)
Subjective:  Patient ID: Charles Golden, male    DOB: 08/28/1925, 86 y.o.   MRN: [id number]  Patient Care Team: Baruch Gouty, FNP as PCP - General (Family Medicine) Irine Seal, MD (Urology) Richmond Campbell, MD (Gastroenterology) Clent Jacks, MD (Ophthalmology) Gaynelle Arabian, MD as Consulting Physician (Orthopedic Surgery) Jodi Marble, MD as Consulting Physician (Otolaryngology) Juanito Doom, MD as Consulting Physician (Pulmonary Disease) Kathrynn Ducking, MD as Consulting Physician (Neurology) Ilean China, RN as Registered Nurse   Chief Complaint:  Medical Management of Chronic Issues and COPD   HPI: Charles Golden is a 86 y.o. male presenting on 09/04/2019 for Medical Management of Chronic Issues and COPD  1. COPD mixed type ([place]) Pt is followed by pulmonology on a regular basis. He uses his Breo daily and albuterol nebulizer on an as needed bracelet. Does have exertional SHOB and some fatigue and malaise. States he has noticed an increase in the fatigue and malaise over the last several weeks.   2. Thoracic aortic atherosclerosis (HCC) Pt is not on ASA or statin therapy and does not wish to be. He does watch his diet and stays very active for his age. No chest pain, abdominal pain, or lower extremity weakness.   3. Benign prostatic hyperplasia with nocturia Pt does follow with urology on a regular basis. States he does have frequency and nocturia. No retention or post void dribbling. No rectal or scrotal pressure. No hematuria.   4. Vitamin D deficiency On repletion therapy. No recent fracture, falls, or injuries. Does feel fatigue and malaise.   5. Vitamin B12 deficiency On oral repletion therapy. No oral lesions or rashes. Does have fatigue and malaise.      Relevant past medical, surgical, family, and social history reviewed and updated as indicated.  Allergies and medications reviewed and updated. Date reviewed: Chart in Epic.   Past Medical  History:  Diagnosis Date  . Bronchitis, chronic ([place])   . COPD (chronic obstructive pulmonary disease) ([place])   . Enlarged prostate   . GERD (gastroesophageal reflux disease)   . Glaucoma   . Glucagonoma   . Hernia, incisional    at present  . HOH (hard of hearing)   . IBS (irritable bowel syndrome)   . Sciatic pain   . Sinus congestion     Past Surgical History:  Procedure Laterality Date  . BOWEL RESECTION N/A 09/04/2012   Procedure: SMALL BOWEL RESECTION;  Surgeon: Donato Heinz, MD;  Location: [facility];  Service: General;  Laterality: N/A;  Anastimosis  . HEMORRHOID SURGERY    . HERNIA REPAIR Bilateral 70's  . INGUINAL HERNIA REPAIR Left 09/14/2013   Procedure: LEFT INGUINAL HERNIORRHAPHY;  Surgeon: Jamesetta So, MD;  Location: [facility];  Service: General;  Laterality: Left;  . INGUINAL HERNIA REPAIR Right 12/23/2018   Procedure: RECURRENT RIGHT INGUINAL HERNIA  REPAIR  WITH MESH;  Surgeon: Aviva Signs, MD;  Location: [facility];  Service: General;  Laterality: Right;  . INSERTION OF MESH Left 09/14/2013   Procedure: INSERTION OF MESH;  Surgeon: Jamesetta So, MD;  Location: [facility];  Service: General;  Laterality: Left;  . KNEE ARTHROSCOPY Left 07/21/2014   Procedure: LEFT KNEE ARTHROSCOPY WITH MEDIAL MENISCAL DEBRIDEMENT ;  Surgeon: Gearlean Alf, MD;  Location: [facility];  Service: Orthopedics;  Laterality: Left;  . LAPAROTOMY N/A 09/04/2012   Procedure: EXPLORATORY LAPAROTOMY;  Surgeon: Donato Heinz, MD;  Location: [facility];  Service: General;  Laterality: N/A;  . SKIN LESION EXCISION     Dr Wolicki  . TONSILLECTOMY      Social History   Socioeconomic History  . Marital status: Married    Spouse name: [name]  . Number of children: 3  . Years of education: 12+  . Highest education level: Bachelor's degree (e.g., BA, AB, BS)  Occupational History  . Occupation: Retired    Comment: Textile Engineer  Tobacco Use  . Smoking status: Former Smoker    Packs/day: 1.00     Years: 30.00    Pack years: 30.00    Types: Cigarettes    Quit date: 05/28/2006    Years since quitting: 13.2  . Smokeless tobacco: Never Used  Substance and Sexual Activity  . Alcohol use: Not Currently    Alcohol/week: 0.0 standard drinks  . Drug use: No  . Sexual activity: Yes    Birth control/protection: None  Other Topics Concern  . Not on file  Social History Narrative   Patient lives at home with [name].    Patient has 3 children.    Patient has his BS   Patient is retired.    Drinks about 2 cups of coffee per day.      [facility]:   He is still married. He previously worked as a textile engineer. No significant dust exposure. He mostly worked with synthetic fibers. He is from [place].       Social Determinants of Health   Financial Resource Strain: Low Risk   . Difficulty of Paying Living Expenses: Not hard at all  Food Insecurity: No Food Insecurity  . Worried About Running Out of Food in the Last Year: Never true  . Ran Out of Food in the Last Year: Never true  Transportation Needs: No Transportation Needs  . Lack of Transportation (Medical): No  . Lack of Transportation (Non-Medical): No  Physical Activity: Inactive  . Days of Exercise per Week: 0 days  . Minutes of Exercise per Session: 0 min  Stress: No Stress Concern Present  . Feeling of Stress : Not at all  Social Connections: Not Isolated  . Frequency of Communication with Friends and Family: More than three times a week  . Frequency of Social Gatherings with Friends and Family: More than three times a week  . Attends Religious Services: More than 4 times per year  . Active Member of Clubs or Organizations: Yes  . Attends Club or Organization Meetings: More than 4 times per year  . Marital Status: Married  Intimate Partner Violence: Not At Risk  . Fear of Current or Ex-Partner: No  . Emotionally Abused: No  . Physically Abused: No  . Sexually Abused: No    Outpatient Encounter  Medications as of 09/04/2019  Medication Sig  . albuterol (PROVENTIL) (2.5 MG/3ML) 0.083% nebulizer solution Take 3 mLs (2.5 mg total) by nebulization every 6 (six) hours as needed for wheezing or shortness of breath.  . Azelastine HCl 137 MCG/SPRAY SOLN PLACE 1 SPRAY INTO THE NOSE 1 DAY OR 1 DOSE. USE IN EACH NOSTRIL AS DIRECTED  . BREO ELLIPTA 200-25 MCG/INH AEPB Inhale 1 puff into the lungs daily.  . Cholecalciferol (VITAMIN D3) 2000 UNITS TABS Take 1 tablet by mouth daily.  . Cyanocobalamin (VITAMIN B-12) 5000 MCG SUBL Place 1 tablet under the tongue daily.  . finasteride (PROSCAR) 5 MG tablet Take 1 tablet (5 mg total) by mouth daily.  . fluticasone (FLONASE) 50 MCG/ACT nasal   spray 1 SPRAY IN EACH NOSTRIL ONCE A DAY  . guaiFENesin (MUCINEX) 600 MG 12 hr tablet Take 600 mg by mouth as needed. Reported on 05/25/2015  . latanoprost (XALATAN) 0.005 % ophthalmic solution Place 1 drop into the left eye at bedtime.  . Respiratory Therapy Supplies (FLUTTER) DEVI 1 Device by Other route as needed.  . sodium chloride HYPERTONIC 3 % nebulizer solution Take by nebulization 2 (two) times daily.  . tamsulosin (FLOMAX) 0.4 MG CAPS capsule Take 0.4 mg by mouth daily.  . timolol (TIMOPTIC) 0.5 % ophthalmic solution Place 1 drop into the left eye every morning.  . [DISCONTINUED] finasteride (PROSCAR) 5 MG tablet Take 1 tablet (5 mg total) by mouth daily.  . albuterol (VENTOLIN HFA) 108 (90 Base) MCG/ACT inhaler Inhale 2 puffs into the lungs every 6 (six) hours as needed for wheezing or shortness of breath.   No facility-administered encounter medications on file as of 09/04/2019.    Allergies  Allergen Reactions  . Demerol [Meperidine] Other (See Comments)    Pt. States "he woke up during a colonoscopy"  . Spiriva Handihaler [Tiotropium Bromide Monohydrate] Other (See Comments)    Mild Urinary Retention    Review of Systems  Constitutional: Positive for activity change and fatigue. Negative for appetite  change, chills, diaphoresis, fever and unexpected weight change.  HENT: Negative.   Eyes: Negative.  Negative for photophobia and visual disturbance.  Respiratory: Negative for cough, chest tightness and shortness of breath.   Cardiovascular: Negative for chest pain, palpitations and leg swelling.  Gastrointestinal: Negative for abdominal pain, blood in stool, constipation, diarrhea, nausea and vomiting.  Endocrine: Negative.  Negative for cold intolerance, heat intolerance, polydipsia, polyphagia and polyuria.  Genitourinary: Positive for frequency. Negative for decreased urine volume, difficulty urinating, dysuria, flank pain, scrotal swelling and urgency.  Musculoskeletal: Negative for arthralgias and myalgias.  Skin: Negative.   Allergic/Immunologic: Negative.   Neurological: Negative for dizziness, tremors, seizures, syncope, facial asymmetry, speech difficulty, weakness, light-headedness, numbness and headaches.  Hematological: Negative.   Psychiatric/Behavioral: Negative for confusion, hallucinations, sleep disturbance and suicidal ideas.  All other systems reviewed and are negative.       Objective:  BP 137/77   Pulse 69   Temp 98 F (36.7 C)   Ht 5' 10.5" (1.791 m)   Wt 177 lb 3.2 oz (80.4 kg)   SpO2 96%   BMI 25.07 kg/m    Wt Readings from Last 3 Encounters:  09/04/19 177 lb 3.2 oz (80.4 kg)  05/27/19 172 lb (78 kg)  04/06/19 172 lb 9.6 oz (78.3 kg)    Physical Exam Vitals and nursing note reviewed.  Constitutional:      General: He is not in acute distress.    Appearance: Normal appearance. He is well-developed and well-groomed. He is not ill-appearing, toxic-appearing or diaphoretic.  HENT:     Head: Normocephalic and atraumatic.     Jaw: There is normal jaw occlusion.     Right Ear: Decreased hearing noted.     Left Ear: Decreased hearing noted.     Ears:     Comments: Bilateral hearing aids    Nose: Nose normal.     Mouth/Throat:     Lips: Pink.      Mouth: Mucous membranes are moist.     Pharynx: Oropharynx is clear. Uvula midline.  Eyes:     General: Lids are normal.     Extraocular Movements: Extraocular movements intact.     Conjunctiva/sclera: Conjunctivae normal.       Pupils: Pupils are equal, round, and reactive to light.  Neck:     Thyroid: No thyroid mass, thyromegaly or thyroid tenderness.     Vascular: No carotid bruit or JVD.     Trachea: Trachea and phonation normal.  Cardiovascular:     Rate and Rhythm: Normal rate and regular rhythm.     Chest Wall: PMI is not displaced.     Pulses: Normal pulses.     Heart sounds: Normal heart sounds. No murmur. No friction rub. No gallop.   Pulmonary:     Effort: Pulmonary effort is normal. No respiratory distress.     Breath sounds: Normal breath sounds. No wheezing.  Abdominal:     General: Bowel sounds are normal. There is no distension or abdominal bruit.     Palpations: Abdomen is soft. There is no hepatomegaly or splenomegaly.     Tenderness: There is no abdominal tenderness. There is no right CVA tenderness, left CVA tenderness, guarding or rebound.     Hernia: A hernia (midline incisional) is present.  Musculoskeletal:        General: Normal range of motion.     Cervical back: Normal range of motion and neck supple.     Right lower leg: No edema.     Left lower leg: No edema.  Lymphadenopathy:     Cervical: No cervical adenopathy.  Skin:    General: Skin is warm and dry.     Capillary Refill: Capillary refill takes less than 2 seconds.     Coloration: Skin is not cyanotic, jaundiced or pale.     Findings: No rash.  Neurological:     General: No focal deficit present.     Mental Status: He is alert and oriented to person, place, and time.     Cranial Nerves: Cranial nerves are intact. No cranial nerve deficit.     Sensory: Sensation is intact. No sensory deficit.     Motor: Motor function is intact. No weakness.     Coordination: Coordination is intact.  Coordination normal.     Gait: Gait is intact. Gait normal.     Deep Tendon Reflexes: Reflexes are normal and symmetric. Reflexes normal.  Psychiatric:        Attention and Perception: Attention and perception normal.        Mood and Affect: Mood and affect normal.        Speech: Speech normal.        Behavior: Behavior normal. Behavior is cooperative.        Thought Content: Thought content normal.        Cognition and Memory: Cognition and memory normal.        Judgment: Judgment normal.     Results for orders placed or performed in visit on 04/07/19  MYCOBACTERIA, CULTURE, WITH FLUOROCHROME SMEAR   Specimen: Sputum  Result Value Ref Range   MICRO NUMBER: [id number]    SPECIMEN QUALITY: Adequate    Source: SPUTUM    STATUS: FINAL    SMEAR: No acid fast bacilli seen.    RESULT:      No Mycobacterium species isolated after 6 weeks incubation.  Respiratory or Resp and Sputum Culture   Specimen: Sputum  Result Value Ref Range   MICRO NUMBER: [id number]    SPECIMEN QUALITY: Adequate    Source SPUTUM    STATUS: FINAL    GRAM STAIN: (A)     Gram stain indicates that the specimen is representative of the lower respiratory   tract. No white blood cells seen Many Gram positive cocci Few Gram negative bacilli Few Gram negative coccobacilli   RESULT: Growth of normal oropharyngeal flora.   Extra  Result Value Ref Range   EXTRA TUBE RECEIVED     SPECIMEN TYPE RECEIVED: 1SU        Pertinent labs & imaging results that were available during my care of the patient were reviewed by me and considered in my medical decision making.  Assessment & Plan:  Kass was seen today for medical management of chronic issues and copd.  Diagnoses and all orders for this visit:  COPD mixed type ([place]) Pt followed by pulmonology on a regular basis. Well controlled on previous therapy.  -     albuterol (VENTOLIN HFA) 108 (90 Base) MCG/ACT inhaler; Inhale 2 puffs into the lungs every 6 (six) hours as  needed for wheezing or shortness of breath. -     CBC with Differential/Platelet  Thoracic aortic atherosclerosis (HCC) Not on ASA or statin therapy and does not wish to be.   Benign prostatic hyperplasia with nocturia Followed by urology on a regular basis. No new or worsening symptoms. Continue current therapy.  -     finasteride (PROSCAR) 5 MG tablet; Take 1 tablet (5 mg total) by mouth daily.  Weakness Fatigue, unspecified type Ongoing and worsening symptoms. Will check below for possible causes. Fatigue and weakness is likely due to aging.  -     CMP14+EGFR -     CBC with Differential/Platelet -     Thyroid Panel With TSH -     VITAMIN D 25 Hydroxy (Vit-D Deficiency, Fractures) -     Vitamin B12  Vitamin D deficiency Labs pending. Continue repletion therapy. If indicated, will change repletion dosage. Eat foods rich in Vit D including milk, orange juice, yogurt with vitamin D added, salmon or mackerel, canned tuna fish, cereals with vitamin D added, and cod liver oil. Get out in the sun but make sure to wear at least SPF 30 sunscreen.  -     VITAMIN D 25 Hydroxy (Vit-D Deficiency, Fractures)  Vitamin B12 deficiency On repletion therapy. Will check below today.  -     Vitamin B12     Continue all other maintenance medications.  Follow up plan: Return in about 3 months (around 12/04/2019), or if symptoms worsen or fail to improve.   Continue healthy lifestyle choices, including diet (rich in fruits, vegetables, and lean proteins, and low in salt and simple carbohydrates) and exercise (at least 30 minutes of moderate physical activity daily).   The above assessment and management plan was discussed with the patient. The patient verbalized understanding of and has agreed to the management plan. Patient is aware to call the clinic if they develop any new symptoms or if symptoms persist or worsen. Patient is aware when to return to the clinic for a follow-up visit. Patient educated  on when it is appropriate to go to the emergency department.   [name], FNP-C [facility] [phone]

## 2019-09-05 LAB — THYROID PANEL WITH TSH
Free Thyroxine Index: 1.8 (ref 1.2–4.9)
T3 Uptake Ratio: 25 % (ref 24–39)
T4, Total: 7.2 ug/dL (ref 4.5–12.0)
TSH: 2.36 u[IU]/mL (ref 0.450–4.500)

## 2019-09-05 LAB — CBC WITH DIFFERENTIAL/PLATELET
Basophils Absolute: 0 10*3/uL (ref 0.0–0.2)
Basos: 0 %
EOS (ABSOLUTE): 0.3 10*3/uL (ref 0.0–0.4)
Eos: 3 %
Hematocrit: 44.2 % (ref 37.5–51.0)
Hemoglobin: 14.5 g/dL (ref 13.0–17.7)
Immature Grans (Abs): 0 10*3/uL (ref 0.0–0.1)
Immature Granulocytes: 0 %
Lymphocytes Absolute: 2.4 10*3/uL (ref 0.7–3.1)
Lymphs: 27 %
MCH: 30.5 pg (ref 26.6–33.0)
MCHC: 32.8 g/dL (ref 31.5–35.7)
MCV: 93 fL (ref 79–97)
Monocytes Absolute: 0.9 10*3/uL (ref 0.1–0.9)
Monocytes: 10 %
Neutrophils Absolute: 5.1 10*3/uL (ref 1.4–7.0)
Neutrophils: 60 %
Platelets: 278 10*3/uL (ref 150–450)
RBC: 4.76 x10E6/uL (ref 4.14–5.80)
RDW: 12.6 % (ref 11.6–15.4)
WBC: 8.7 10*3/uL (ref 3.4–10.8)

## 2019-09-05 LAB — CMP14+EGFR
ALT: 9 IU/L (ref 0–44)
AST: 17 IU/L (ref 0–40)
Albumin/Globulin Ratio: 1.5 (ref 1.2–2.2)
Albumin: 3.8 g/dL (ref 3.5–4.6)
Alkaline Phosphatase: 91 IU/L (ref 39–117)
BUN/Creatinine Ratio: 18 (ref 10–24)
BUN: 26 mg/dL (ref 10–36)
Bilirubin Total: 0.2 mg/dL (ref 0.0–1.2)
CO2: 24 mmol/L (ref 20–29)
Calcium: 9.5 mg/dL (ref 8.6–10.2)
Chloride: 105 mmol/L (ref 96–106)
Creatinine, Ser: 1.43 mg/dL — ABNORMAL HIGH (ref 0.76–1.27)
GFR calc Af Amer: 49 mL/min/{1.73_m2} — ABNORMAL LOW (ref >59)
GFR calc non Af Amer: 43 mL/min/{1.73_m2} — ABNORMAL LOW (ref >59)
Globulin, Total: 2.5 g/dL (ref 1.5–4.5)
Glucose: 94 mg/dL (ref 65–99)
Potassium: 5 mmol/L (ref 3.5–5.2)
Sodium: 139 mmol/L (ref 134–144)
Total Protein: 6.3 g/dL (ref 6.0–8.5)

## 2019-09-05 LAB — VITAMIN D 25 HYDROXY (VIT D DEFICIENCY, FRACTURES): Vit D, 25-Hydroxy: 43.8 ng/mL (ref 30.0–100.0)

## 2019-09-05 LAB — VITAMIN B12: Vitamin B-12: 1201 pg/mL (ref 232–1245)

## 2019-09-09 ENCOUNTER — Ambulatory Visit: Payer: Medicare Other | Admitting: Family Medicine

## 2019-09-09 NOTE — Progress Notes (Signed)
Renal function remains declined but stable. Will continue to monitor. Glucose and liver functions are normal. CBC, thyroid function, Vit D, and Vit B 12 are all normal.

## 2019-09-10 ENCOUNTER — Encounter: Payer: Self-pay | Admitting: Family Medicine

## 2019-09-18 ENCOUNTER — Ambulatory Visit (INDEPENDENT_AMBULATORY_CARE_PROVIDER_SITE_OTHER): Payer: Medicare Other | Admitting: Pulmonary Disease

## 2019-09-18 ENCOUNTER — Other Ambulatory Visit: Payer: Self-pay

## 2019-09-18 ENCOUNTER — Encounter: Payer: Self-pay | Admitting: Pulmonary Disease

## 2019-09-18 VITALS — BP 122/62 | HR 67 | Temp 97.4°F | Ht 70.5 in | Wt 174.0 lb

## 2019-09-18 DIAGNOSIS — J479 Bronchiectasis, uncomplicated: Secondary | ICD-10-CM

## 2019-09-18 DIAGNOSIS — J449 Chronic obstructive pulmonary disease, unspecified: Secondary | ICD-10-CM

## 2019-09-18 DIAGNOSIS — J4489 Other specified chronic obstructive pulmonary disease: Secondary | ICD-10-CM

## 2019-09-18 NOTE — Patient Instructions (Signed)
Use breo daily  Can use albuterol nebulizer or ventolin interchangeably   Follow up in 6 months

## 2019-09-18 NOTE — Progress Notes (Signed)
[facility]  Chief Complaint  Patient presents with  . Follow-up    follow up, SOB "little worse" since last visit, fatigue worse no cough      Constitutional:  BP 122/62 (Cuff Size: Normal)   Pulse 67   Temp (!) 97.4 F (36.3 C) (Temporal)   Ht 5' 10.5" (1.791 m)   Wt 174 lb (78.9 kg)   SpO2 97%   BMI 24.61 kg/m   Past Medical History:  Sciatica, IBS, Glaucoma, Glucagonoma, GERD, BPH  Brief Summary:  Charles Golden is a 86 y.o. male former smoker with BTX, COPD/emphysema and GERD.  Subjective:   Sputum for culture and AFB were negative from November 2020.  He gets sputum production more in the morning.  Not having chest pain, fever, hemoptysis, sinus congestion.  Gets intermittent wheezing.  Received COVID vaccine.  Physical Exam:   Appearance - well kempt   ENMT - no sinus tenderness, no oral exudate, no LAN, no stridor  Respiratory - equal breath sounds bilaterally, no wheezing or rales  CV - s1s2 regular rate and rhythm, no murmurs  Ext - no clubbing, no edema  Skin - no rashes  Psych - normal mood and affect   Assessment/Plan:   Bronchiectasis. - discussed roles for his different therapies - continue albuterol and hypertonic saline - continue mucinex, flutter valve as needed  COPD with chronic bronchitis and emphysema. - continue breo - explained that albuterol nebulizer and ventolin can be used interchangeably  Allergic rhinitis. - prn flonase, azelastine  A total of  22 minutes spent addressing patient care issues on day of visit.   Follow up:   Patient Instructions  Use breo daily  Can use albuterol nebulizer or ventolin interchangeably   Follow up in 6 months   Signature:  [name], MD [place] Pager: [phone] 09/18/2019, 12:33 PM  Flow Sheet     Pulmonary tests:  A1AT 04/11/15 >> 153, MM PFT 07/13/15 >> FEV1 2.40 (92%), FEV1% 66, TLC 8.07 (112%), DLCO  75%, +BD  Chest imaging:  CT chest 04/19/15 >> mild cylindrical BTX in bases, mild centrilobular emphysema, calcified mediastinal LN  Sleep tests:  PSG 11/15/16 >> AHI 3.6  Medications:   Allergies as of 09/18/2019      Reactions   Demerol [meperidine] Other (See Comments)   Pt. States "he woke up during a colonoscopy"   Spiriva Handihaler [tiotropium Bromide Monohydrate] Other (See Comments)   Mild Urinary Retention      Medication List       Accurate as of September 18, 2019 12:33 PM. If you have any questions, ask your nurse or doctor.        albuterol (2.5 MG/3ML) 0.083% nebulizer solution Commonly known as: PROVENTIL Take 3 mLs (2.5 mg total) by nebulization every 6 (six) hours as needed for wheezing or shortness of breath.   albuterol 108 (90 Base) MCG/ACT inhaler Commonly known as: VENTOLIN HFA Inhale 2 puffs into the lungs every 6 (six) hours as needed for wheezing or shortness of breath.   Azelastine HCl 137 MCG/SPRAY Soln PLACE 1 SPRAY INTO THE NOSE 1 DAY OR 1 DOSE. USE IN EACH NOSTRIL AS DIRECTED   Breo Ellipta 200-25 MCG/INH Aepb Generic drug: fluticasone furoate-vilanterol Inhale 1 puff into the lungs daily.   finasteride 5 MG tablet Commonly known as: PROSCAR Take 1 tablet (5 mg total) by mouth daily.   fluticasone 50 MCG/ACT nasal spray Commonly known as:  FLONASE 1 SPRAY IN EACH NOSTRIL ONCE A DAY   Flutter Devi 1 Device by Other route as needed.   guaiFENesin 600 MG 12 hr tablet Commonly known as: MUCINEX Take 600 mg by mouth as needed. Reported on 05/25/2015   latanoprost 0.005 % ophthalmic solution Commonly known as: XALATAN Place 1 drop into the left eye at bedtime.   sodium chloride HYPERTONIC 3 % nebulizer solution Take by nebulization 2 (two) times daily.   tamsulosin 0.4 MG Caps capsule Commonly known as: FLOMAX Take 0.4 mg by mouth daily.   timolol 0.5 % ophthalmic solution Commonly known as: TIMOPTIC Place 1 drop into the left eye  every morning.   Vitamin B-12 5000 MCG Subl Place 1 tablet under the tongue daily.   Vitamin D3 50 MCG (2000 UT) Tabs Take 1 tablet by mouth daily.       Past Surgical History:  He  has a past surgical history that includes Hemorrhoid surgery; laparotomy (N/A, 09/04/2012); Bowel resection (N/A, 09/04/2012); Inguinal hernia repair (Left, 09/14/2013); Insertion of mesh (Left, 09/14/2013); Tonsillectomy; Knee arthroscopy (Left, 07/21/2014); Skin lesion excision; Hernia repair (Bilateral, 70's); and Inguinal hernia repair (Right, 12/23/2018).  Family History:  His family history includes Asthma in an other family member; Colon cancer in his father; Healthy in his daughter, daughter, and daughter; Heart disease in his mother.  Social History:  He  reports that he quit smoking about 13 years ago. His smoking use included cigarettes. He has a 30.00 pack-year smoking history. He has never used smokeless tobacco. He reports previous alcohol use. He reports that he does not use drugs.

## 2019-09-25 DIAGNOSIS — H903 Sensorineural hearing loss, bilateral: Secondary | ICD-10-CM | POA: Insufficient documentation

## 2019-10-12 DIAGNOSIS — R350 Frequency of micturition: Secondary | ICD-10-CM | POA: Diagnosis not present

## 2019-10-12 DIAGNOSIS — R351 Nocturia: Secondary | ICD-10-CM | POA: Diagnosis not present

## 2019-10-12 DIAGNOSIS — R3912 Poor urinary stream: Secondary | ICD-10-CM | POA: Diagnosis not present

## 2019-10-12 DIAGNOSIS — N401 Enlarged prostate with lower urinary tract symptoms: Secondary | ICD-10-CM | POA: Diagnosis not present

## 2019-10-17 ENCOUNTER — Other Ambulatory Visit: Payer: Self-pay | Admitting: Family Medicine

## 2019-10-20 ENCOUNTER — Other Ambulatory Visit: Payer: Self-pay | Admitting: *Deleted

## 2019-10-20 DIAGNOSIS — J324 Chronic pansinusitis: Secondary | ICD-10-CM

## 2019-10-20 MED ORDER — FLUTICASONE PROPIONATE 50 MCG/ACT NA SUSP: NASAL | 0 refills | Status: DC

## 2019-10-23 ENCOUNTER — Other Ambulatory Visit: Payer: Self-pay | Admitting: *Deleted

## 2019-10-23 MED ORDER — BREO ELLIPTA 200-25 MCG/INH IN AEPB: 1.0000 | INHALATION_SPRAY | Freq: Every day | RESPIRATORY_TRACT | 1 refills | Status: DC

## 2019-11-11 DIAGNOSIS — H40051 Ocular hypertension, right eye: Secondary | ICD-10-CM | POA: Diagnosis not present

## 2019-11-11 DIAGNOSIS — H401121 Primary open-angle glaucoma, left eye, mild stage: Secondary | ICD-10-CM | POA: Diagnosis not present

## 2019-12-07 ENCOUNTER — Other Ambulatory Visit: Payer: Self-pay | Admitting: Family Medicine

## 2019-12-07 DIAGNOSIS — J324 Chronic pansinusitis: Secondary | ICD-10-CM

## 2019-12-08 ENCOUNTER — Other Ambulatory Visit: Payer: Self-pay

## 2019-12-08 ENCOUNTER — Ambulatory Visit (INDEPENDENT_AMBULATORY_CARE_PROVIDER_SITE_OTHER): Payer: Medicare Other | Admitting: Family Medicine

## 2019-12-08 VITALS — BP 122/82 | HR 78 | Temp 97.9°F | Ht 70.0 in | Wt 175.0 lb

## 2019-12-08 DIAGNOSIS — R351 Nocturia: Secondary | ICD-10-CM

## 2019-12-08 DIAGNOSIS — N401 Enlarged prostate with lower urinary tract symptoms: Secondary | ICD-10-CM | POA: Diagnosis not present

## 2019-12-08 DIAGNOSIS — Z7689 Persons encountering health services in other specified circumstances: Secondary | ICD-10-CM | POA: Diagnosis not present

## 2019-12-08 DIAGNOSIS — R5382 Chronic fatigue, unspecified: Secondary | ICD-10-CM

## 2019-12-08 DIAGNOSIS — J449 Chronic obstructive pulmonary disease, unspecified: Secondary | ICD-10-CM

## 2019-12-08 NOTE — Progress Notes (Signed)
Subjective: CC: Establish care, fatigue, COPD PCP: [name], DO INO:MVEHM R Wernette is a 86 y.o. male presenting to clinic today for:  1.  Chronic fatigue Patient reports many year history of stable, chronic fatigue.  He recently had metabolic work-up which was unremarkable.  He denies any blood loss.  He does have known COPD and is followed by pulmonology for this.  He feels relatively stable on his current medications.  No hemoptysis.  No unplanned weight loss or night sweats.  Denies any known snoring but does note that he wakes up at least 1-2 times a night to go to the restroom.  He has known BPH.  He is followed by urology for this.  He had a sleep study done several years ago that he reports was unremarkable.  He takes at least 2 naps per day.  Denies any falling asleep easily (no falling asleep during television, driving or riding in a car), no loss of consciousness with increased emotional states.    ROS: Per HPI  Allergies  Allergen Reactions  . Demerol [Meperidine] Other (See Comments)    Pt. States "he woke up during a colonoscopy"  . Spiriva Handihaler [Tiotropium Bromide Monohydrate] Other (See Comments)    Mild Urinary Retention   Past Medical History:  Diagnosis Date  . Bronchitis, chronic ([place])   . COPD (chronic obstructive pulmonary disease) ([place])   . Enlarged prostate   . GERD (gastroesophageal reflux disease)   . Glaucoma   . Glucagonoma   . Hernia, incisional    at present  . HOH (hard of hearing)   . IBS (irritable bowel syndrome)   . Sciatic pain   . Sinus congestion     Current Outpatient Medications:  .  azelastine (ASTELIN) 0.1 % nasal spray, PLACE 1 SPRAY INTO THE NOSE 1 DAY OR 1 DOSE. USE IN EACH NOSTRIL AS DIRECTED, Disp: 30 mL, Rfl: 0 .  fluticasone (FLONASE) 50 MCG/ACT nasal spray, 1 SPRAY IN EACH NOSTRIL ONCE A DAY, Disp: 16 g, Rfl: 0 .  albuterol (PROVENTIL) (2.5 MG/3ML) 0.083% nebulizer solution, Take 3 mLs (2.5 mg total) by  nebulization every 6 (six) hours as needed for wheezing or shortness of breath., Disp: 360 mL, Rfl: 1 .  albuterol (VENTOLIN HFA) 108 (90 Base) MCG/ACT inhaler, Inhale 2 puffs into the lungs every 6 (six) hours as needed for wheezing or shortness of breath., Disp: 18 g, Rfl: 11 .  BREO ELLIPTA 200-25 MCG/INH AEPB, Inhale 1 puff into the lungs daily., Disp: 1 each, Rfl: 1 .  Cholecalciferol (VITAMIN D3) 2000 UNITS TABS, Take 1 tablet by mouth daily., Disp: , Rfl:  .  Cyanocobalamin (VITAMIN B-12) 5000 MCG SUBL, Place 1 tablet under the tongue daily., Disp: , Rfl:  .  finasteride (PROSCAR) 5 MG tablet, Take 1 tablet (5 mg total) by mouth daily., Disp: 90 tablet, Rfl: 3 .  guaiFENesin (MUCINEX) 600 MG 12 hr tablet, Take 600 mg by mouth as needed. Reported on 05/25/2015, Disp: , Rfl:  .  latanoprost (XALATAN) 0.005 % ophthalmic solution, Place 1 drop into the left eye at bedtime., Disp: 2.5 mL, Rfl: 1 .  Respiratory Therapy Supplies (FLUTTER) DEVI, 1 Device by Other route as needed., Disp: 1 each, Rfl: 0 .  sodium chloride HYPERTONIC 3 % nebulizer solution, Take by nebulization 2 (two) times daily., Disp: 750 mL, Rfl: 1 .  tamsulosin (FLOMAX) 0.4 MG CAPS capsule, Take 0.4 mg by mouth daily., Disp: , Rfl:  .  timolol (TIMOPTIC) 0.5 % ophthalmic solution, Place 1 drop into the left eye every morning., Disp: , Rfl:  Social History   Socioeconomic History  . Marital status: Married    Spouse name: [name]  . Number of children: 3  . Years of education: 12+  . Highest education level: Bachelor's degree (e.g., BA, AB, BS)  Occupational History  . Occupation: Retired    Comment: Immunologist  Tobacco Use  . Smoking status: Former Smoker    Packs/day: 1.00    Years: 30.00    Pack years: 30.00    Types: Cigarettes    Quit date: 05/28/2006    Years since quitting: 13.5  . Smokeless tobacco: Never Used  Vaping Use  . Vaping Use: Never used  Substance and Sexual Activity  . Alcohol use: Not  Currently    Alcohol/week: 0.0 standard drinks  . Drug use: No  . Sexual activity: Yes    Birth control/protection: None  Other Topics Concern  . Not on file  Social History Narrative   Patient lives at home with his wife [name].    Patient has 3 children.    Patient has his BS   Patient is retired.    Drinks about 2 cups of coffee per day.      [facility]:   He is still married. 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 He is from [place].       Social Determinants of Health   Financial Resource Strain: Low Risk   . Difficulty of Paying Living Expenses: Not hard at all  Food Insecurity: No Food Insecurity  . Worried About Charity fundraiser in the Last Year: Never true  . Ran Out of Food in the Last Year: Never true  Transportation Needs: No Transportation Needs  . Lack of Transportation (Medical): No  . Lack of Transportation (Non-Medical): No  Physical Activity: Inactive  . Days of Exercise per Week: 0 days  . Minutes of Exercise per Session: 0 min  Stress: No Stress Concern Present  . Feeling of Stress : Not at all  Social Connections: Socially Integrated  . Frequency of Communication with Friends and Family: More than three times a week  . Frequency of Social Gatherings with Friends and Family: More than three times a week  . Attends Religious Services: More than 4 times per year  . Active Member of Clubs or Organizations: Yes  . Attends Archivist Meetings: More than 4 times per year  . Marital Status: Married  Human resources officer Violence: Not At Risk  . Fear of Current or Ex-Partner: No  . Emotionally Abused: No  . Physically Abused: No  . Sexually Abused: No   Family History  Problem Relation Age of Onset  . Heart disease Mother        Valve replacement and pacemaker  . Colon cancer Father   . Asthma Other        cousin  . Healthy Daughter   . Healthy Daughter   .  Healthy Daughter     Objective: Office vital signs reviewed. BP 122/82   Pulse 78   Temp 97.9 F (36.6 C) (Temporal)   Ht 5\' 10"  (1.778 m)   Wt 175 lb (79.4 kg)   SpO2 96%   BMI 25.11 kg/m   Physical Examination:  General: Awake, alert, well nourished, No acute distress HEENT: Normal, sclera white, MMM Cardio: regular  rate and rhythm, S1S2 heard, no murmurs appreciated Pulm: clear to auscultation bilaterally, no wheezes, rhonchi or rales; normal work of breathing on room air Neuro: No focal neurologic deficits  Assessment/ Plan: 86 y.o. male   1. Chronic fatigue Possibly due to a sleep disturbance.  I offered referral for repeat sleep study but he declined this today.  I reviewed his most recent labs which again were unremarkable.  We reviewed diet and I could not find really any deficits in his diet that may be contributing.  He certainly is not in taking excess sugar or caffeine.  2. COPD mixed type (HCC) Stable  3. Benign prostatic hyperplasia with nocturia Stable  4. Establishing care with new doctor, encounter for   No orders of the defined types were placed in this encounter.  No orders of the defined types were placed in this encounter.    [name], DO [place] [phone]

## 2019-12-08 NOTE — Patient Instructions (Signed)
As we discussed, your labs were normal.   Ok to take multivitamin Recommended dose of Vit D is 800 IU daily. Recommendation for B12 is 2.4mcg daily  Consider sleep study for your fatigue.  Let me know if you want to pursue this. Fresh air, physical activity (walking, swimming) will improve energy.

## 2019-12-24 ENCOUNTER — Encounter: Payer: Self-pay | Admitting: Family Medicine

## 2019-12-24 DIAGNOSIS — D225 Melanocytic nevi of trunk: Secondary | ICD-10-CM | POA: Diagnosis not present

## 2019-12-24 DIAGNOSIS — D485 Neoplasm of uncertain behavior of skin: Secondary | ICD-10-CM | POA: Diagnosis not present

## 2019-12-24 DIAGNOSIS — L821 Other seborrheic keratosis: Secondary | ICD-10-CM | POA: Diagnosis not present

## 2019-12-24 DIAGNOSIS — L708 Other acne: Secondary | ICD-10-CM | POA: Diagnosis not present

## 2020-01-15 ENCOUNTER — Other Ambulatory Visit: Payer: Self-pay | Admitting: Family Medicine

## 2020-01-15 ENCOUNTER — Ambulatory Visit: Payer: Medicare Other | Admitting: Family Medicine

## 2020-01-15 DIAGNOSIS — J324 Chronic pansinusitis: Secondary | ICD-10-CM

## 2020-01-16 ENCOUNTER — Other Ambulatory Visit: Payer: Self-pay | Admitting: Family Medicine

## 2020-01-16 ENCOUNTER — Encounter: Payer: Self-pay | Admitting: Family Medicine

## 2020-01-18 ENCOUNTER — Other Ambulatory Visit: Payer: Self-pay

## 2020-01-18 MED ORDER — BREO ELLIPTA 200-25 MCG/INH IN AEPB: 1.0000 | INHALATION_SPRAY | Freq: Every day | RESPIRATORY_TRACT | 1 refills | Status: DC

## 2020-02-09 ENCOUNTER — Ambulatory Visit: Payer: Medicare Other | Admitting: *Deleted

## 2020-02-09 DIAGNOSIS — J449 Chronic obstructive pulmonary disease, unspecified: Secondary | ICD-10-CM

## 2020-02-09 DIAGNOSIS — I700 Atherosclerosis of aorta: Secondary | ICD-10-CM

## 2020-02-09 NOTE — Patient Instructions (Signed)
Please reach out to the Chronic Care Management team with any care coordination or chronic disease management needs.   Chong Sicilian, BSN, RN-BC Embedded Chronic Care Manager [facility] [place] Management Direct Dial: [phone]

## 2020-02-09 NOTE — Chronic Care Management (AMB) (Signed)
  Chronic Care Management   Initial Visit Note  02/09/2020 Name: Charles Golden MRN: [id number] DOB: [birth year]  Referred by: [name], DO Reason for referral : Chronic Care Management (Initial Visit)   Charles Golden is a 86 y.o. year old male who is a primary care patient of [name], DO. The CCM team was consulted for assistance with chronic disease management and care coordination needs related to COPD and thoracic aortic atherosclerosis  I talked with [name] by telephone today regarding management of his chronic medical conditions. He does not have any resource or CCM needs at this time and he feels that his medical conditions are well controlled. He was appreciative of the telephone call but does not believe he needs CCM services at this time. He will reach out int he future if the need arises.   Plan:   CCM enrollment status changed to "previously enrolled" as per patient request on 02/09/20 to discontinue enrollment. Case closed to case management services in primary care home.   Chong Sicilian, BSN, RN-BC Embedded Chronic Care Manager [facility] [place] Management Direct Dial: [phone]

## 2020-03-02 ENCOUNTER — Ambulatory Visit: Payer: Medicare Other

## 2020-03-15 ENCOUNTER — Ambulatory Visit (INDEPENDENT_AMBULATORY_CARE_PROVIDER_SITE_OTHER): Payer: Medicare Other

## 2020-03-15 ENCOUNTER — Other Ambulatory Visit: Payer: Self-pay

## 2020-03-15 DIAGNOSIS — Z23 Encounter for immunization: Secondary | ICD-10-CM

## 2020-03-22 ENCOUNTER — Other Ambulatory Visit: Payer: Self-pay | Admitting: Family Medicine

## 2020-04-12 DIAGNOSIS — Z23 Encounter for immunization: Secondary | ICD-10-CM | POA: Diagnosis not present

## 2020-05-04 ENCOUNTER — Ambulatory Visit: Payer: Medicare Other

## 2020-05-09 DIAGNOSIS — H353131 Nonexudative age-related macular degeneration, bilateral, early dry stage: Secondary | ICD-10-CM | POA: Diagnosis not present

## 2020-05-09 DIAGNOSIS — H40051 Ocular hypertension, right eye: Secondary | ICD-10-CM | POA: Diagnosis not present

## 2020-05-09 DIAGNOSIS — Z961 Presence of intraocular lens: Secondary | ICD-10-CM | POA: Diagnosis not present

## 2020-05-09 DIAGNOSIS — H401121 Primary open-angle glaucoma, left eye, mild stage: Secondary | ICD-10-CM | POA: Diagnosis not present

## 2020-05-30 DIAGNOSIS — N481 Balanitis: Secondary | ICD-10-CM | POA: Diagnosis not present

## 2020-05-30 DIAGNOSIS — N401 Enlarged prostate with lower urinary tract symptoms: Secondary | ICD-10-CM | POA: Diagnosis not present

## 2020-05-30 DIAGNOSIS — Z87442 Personal history of urinary calculi: Secondary | ICD-10-CM | POA: Diagnosis not present

## 2020-05-30 DIAGNOSIS — R351 Nocturia: Secondary | ICD-10-CM | POA: Diagnosis not present

## 2020-06-06 ENCOUNTER — Ambulatory Visit: Payer: Medicare Other | Admitting: Pulmonary Disease

## 2020-06-09 ENCOUNTER — Ambulatory Visit (INDEPENDENT_AMBULATORY_CARE_PROVIDER_SITE_OTHER): Payer: Medicare Other | Admitting: *Deleted

## 2020-06-09 DIAGNOSIS — Z0000 Encounter for general adult medical examination without abnormal findings: Secondary | ICD-10-CM | POA: Diagnosis not present

## 2020-06-09 NOTE — Progress Notes (Signed)
MEDICARE ANNUAL WELLNESS VISIT  06/09/2020  Telephone Visit Disclaimer This Medicare AWV was conducted by telephone due to national recommendations for restrictions regarding the COVID-19 Pandemic (e.g. social distancing).  I verified, using two identifiers, that I am speaking with Charles Golden or their authorized healthcare agent. I discussed the limitations, risks, security, and privacy concerns of performing an evaluation and management service by telephone and the potential availability of an in-person appointment in the future. The patient expressed understanding and agreed to proceed.  Location of Patient: Home Location of Provider (nurse):  [facility]  Subjective:    IVEY CINA is a 87 y.o. male patient of [name], DO who had a Medicare Annual Wellness Visit today via telephone. Avelardo is Retired and lives with his wife of 11 years. he has 3 children. he reports that he is socially active and does interact with friends/family regularly. he is not physically active and enjoys reading and watching tv especially sports.  Patient Care Team: [name], DO as PCP - General (Family Medicine) Irine Seal, MD (Urology) Richmond Campbell, MD (Gastroenterology) Clent Jacks, MD (Ophthalmology) Gaynelle Arabian, MD as Consulting Physician (Orthopedic Surgery) Juanito Doom, MD as Consulting Physician (Pulmonary Disease) Kathrynn Ducking, MD as Consulting Physician (Neurology) Ilean China, RN as Registered Nurse [name], MD as Consulting Physician (Pulmonary Disease)  Advanced Directives 06/09/2020 06/09/2019 12/23/2018 12/18/2018 12/15/2018 05/05/2018 03/19/2017  Does Patient Have a Medical Advance Directive? Yes Yes Yes Yes Yes Yes No  Type of Paramedic of [place];Living will [place];Living will Healthcare Power of [place];Living will [place];Living will [place];Living will -  Does patient want to make changes to medical advance directive? No - Patient declined No - Patient declined No - Patient declined Yes (MAU/Ambulatory/Procedural Areas - Information given) - No - Patient declined -  Copy of [place] in Chart? No - copy requested No - copy requested - - - No - copy requested -  Would patient like information on creating a medical advance directive? - - - - - - Yes (MAU/Ambulatory/Procedural Areas - Information given)  Pre-existing out of facility DNR order (yellow form or pink MOST form) - - - - - - -    Hospital Utilization Over the Past 12 Months: # of hospitalizations or ER visits: 0 # of surgeries: 0  Review of Systems    Patient reports that his overall health is unchanged compared to last year.  History obtained from chart review and the patient  Patient Reported Readings (BP, Pulse, CBG, Weight, etc) none  Pain Assessment Pain : No/denies pain     Current Medications & Allergies (verified) Allergies as of 06/09/2020      Reactions   Demerol [meperidine] Other (See Comments)   Pt. States "he woke up during a colonoscopy"   Spiriva Handihaler [tiotropium Bromide Monohydrate] Other (See Comments)   Mild Urinary Retention      Medication List       Accurate as of June 09, 2020  8:52 AM. If you have any questions, ask your nurse or doctor.        albuterol (2.5 MG/3ML) 0.083% nebulizer solution Commonly known as: PROVENTIL Take 3 mLs (2.5 mg total) by nebulization every 6 (six) hours as needed for wheezing or shortness of breath.   albuterol 108 (90 Base) MCG/ACT inhaler Commonly known as:  VENTOLIN HFA Inhale 2 puffs into the lungs every 6 (six) hours as needed for wheezing or shortness of breath.   azelastine 0.1 % nasal spray Commonly known as: ASTELIN PLACE 1 SPRAY INTO THE NOSE 1 DAY OR 1 DOSE. USE IN EACH NOSTRIL AS DIRECTED   Breo Ellipta  200-25 MCG/INH Aepb Generic drug: fluticasone furoate-vilanterol Inhale 1 puff into the lungs daily.   finasteride 5 MG tablet Commonly known as: PROSCAR Take 1 tablet (5 mg total) by mouth daily.   fluticasone 50 MCG/ACT nasal spray Commonly known as: FLONASE 1 SPRAY IN EACH NOSTRIL ONCE A DAY   guaiFENesin 600 MG 12 hr tablet Commonly known as: MUCINEX Take 600 mg by mouth as needed. Reported on 05/25/2015   latanoprost 0.005 % ophthalmic solution Commonly known as: XALATAN Place 1 drop into the left eye at bedtime.   sodium chloride HYPERTONIC 3 % nebulizer solution Take by nebulization 2 (two) times daily.   tamsulosin 0.4 MG Caps capsule Commonly known as: FLOMAX Take 0.4 mg by mouth daily.   timolol 0.5 % ophthalmic solution Commonly known as: TIMOPTIC Place 1 drop into the left eye every morning.   Vitamin B-12 5000 MCG Subl Place 1 tablet under the tongue daily.   Vitamin D3 50 MCG (2000 UT) Tabs Take 1 tablet by mouth daily.       History (reviewed): Past Medical History:  Diagnosis Date  . Bronchitis, chronic ([place])   . COPD (chronic obstructive pulmonary disease) ([place])   . Enlarged prostate   . GERD (gastroesophageal reflux disease)   . Glaucoma   . Glucagonoma   . Hernia, incisional    at present  . HOH (hard of hearing)   . IBS (irritable bowel syndrome)   . Sciatic pain   . Sinus congestion    Past Surgical History:  Procedure Laterality Date  . BOWEL RESECTION N/A 09/04/2012   Procedure: SMALL BOWEL RESECTION;  Surgeon: Donato Heinz, MD;  Location: [facility];  Service: General;  Laterality: N/A;  Anastimosis  . HEMORRHOID SURGERY    . HERNIA REPAIR Bilateral 70's  . INGUINAL HERNIA REPAIR Left 09/14/2013   Procedure: LEFT INGUINAL HERNIORRHAPHY;  Surgeon: Jamesetta So, MD;  Location: [facility];  Service: General;  Laterality: Left;  . INGUINAL HERNIA REPAIR Right 12/23/2018   Procedure: RECURRENT RIGHT INGUINAL HERNIA  REPAIR  WITH MESH;   Surgeon: Aviva Signs, MD;  Location: [facility];  Service: General;  Laterality: Right;  . INSERTION OF MESH Left 09/14/2013   Procedure: INSERTION OF MESH;  Surgeon: Jamesetta So, MD;  Location: [facility];  Service: General;  Laterality: Left;  . KNEE ARTHROSCOPY Left 07/21/2014   Procedure: LEFT KNEE ARTHROSCOPY WITH MEDIAL MENISCAL DEBRIDEMENT ;  Surgeon: Gearlean Alf, MD;  Location: [facility];  Service: Orthopedics;  Laterality: Left;  . LAPAROTOMY N/A 09/04/2012   Procedure: EXPLORATORY LAPAROTOMY;  Surgeon: Donato Heinz, MD;  Location: [facility];  Service: General;  Laterality: N/A;  . SKIN LESION EXCISION     [name]  . TONSILLECTOMY     Family History  Problem Relation Age of Onset  . Heart disease Mother        Valve replacement and pacemaker  . Colon cancer Father   . Asthma Other        cousin  . Healthy Daughter   . Healthy Daughter   . Healthy Daughter    Social History   Socioeconomic History  . Marital  status: Married    Spouse name: [name]  . Number of children: 3  . Years of education: 12+  . Highest education level: Bachelor's degree (e.g., BA, AB, BS)  Occupational History  . Occupation: Retired    Comment: Immunologist  Tobacco Use  . Smoking status: Former Smoker    Packs/day: 1.00    Years: 30.00    Pack years: 30.00    Types: Cigarettes    Quit date: 05/28/2006    Years since quitting: 14.0  . Smokeless tobacco: Never Used  Vaping Use  . Vaping Use: Never used  Substance and Sexual Activity  . Alcohol use: Not Currently    Alcohol/week: 0.0 standard drinks  . Drug use: No  . Sexual activity: Yes    Birth control/protection: None  Other Topics Concern  . Not on file  Social History Narrative   Patient lives at home with his wife [name].    Patient has 3 children.    Patient has his BS   Patient is retired.    Drinks about 2 cups of coffee per day.      [facility] [facility]:   He is still married. He previously worked as a Immunologist.  No significant dust exposure. He mostly worked with synthetic fibers. He is from [place].       Social Determinants of Health   Financial Resource Strain: Low Risk   . Difficulty of Paying Living Expenses: Not hard at all  Food Insecurity: Not on file  Transportation Needs: No Transportation Needs  . Lack of Transportation (Medical): No  . Lack of Transportation (Non-Medical): No  Physical Activity: Not on file  Stress: Not on file  Social Connections: Not on file    Activities of Daily Living In your present state of health, do you have any difficulty performing the following activities: 06/09/2020  Hearing? Y  Comment Wears hearing aids  Vision? N  Comment wears glasses  Difficulty concentrating or making decisions? N  Walking or climbing stairs? Y  Comment At time with stairs  Dressing or bathing? N  Doing errands, shopping? N  Preparing Food and eating ? N  Using the Toilet? N  In the past six months, have you accidently leaked urine? N  Do you have problems with loss of bowel control? N  Managing your Medications? N  Managing your Finances? N  Housekeeping or managing your Housekeeping? N  Some recent data might be hidden    Patient Education/ Literacy How often do you need to have someone help you when you read instructions, pamphlets, or other written materials from your doctor or pharmacy?: 1 - Never What is the last grade level you completed in school?: Bachelors degree  Exercise Current Exercise Habits: The patient does not participate in regular exercise at present, Exercise limited by: respiratory conditions(s)  Diet Patient reports consuming 3 meals a day and 2 snack(s) a day Patient reports that his primary diet is: Regular Patient reports that she does have regular access to food.   Depression Screen PHQ 2/9 Scores 06/09/2020 12/08/2019 09/04/2019 06/09/2019 03/04/2019 02/05/2019 01/21/2019  PHQ - 2 Score 0 0 0 0 0 0 0  PHQ- 9 Score - 0 - - - - -   Exception Documentation - - - - - - -     Fall Risk Fall Risk  06/09/2020 12/08/2019 09/04/2019 06/09/2019 03/04/2019  Falls in the past year? 0 0 0 0 0  Comment - - - - -  Number falls in past yr: - - - - -  Injury with Fall? - - - - -  Follow up - - - - -     Objective:  [name] seemed alert and oriented and he participated appropriately during our telephone visit.  Blood Pressure Weight BMI  BP Readings from Last 3 Encounters:  12/08/19 122/82  09/18/19 122/62  09/04/19 137/77   Wt Readings from Last 3 Encounters:  12/08/19 175 lb (79.4 kg)  09/18/19 174 lb (78.9 kg)  09/04/19 177 lb 3.2 oz (80.4 kg)   BMI Readings from Last 1 Encounters:  12/08/19 25.11 kg/m    *Unable to obtain current vital signs, weight, and BMI due to telephone visit type  Hearing/Vision  . Emron did  seem to have difficulty with hearing/understanding during the telephone conversation . Reports that he has had a formal eye exam by an eye care professional within the past year . Reports that he has not had a formal hearing evaluation within the past year *Unable to fully assess hearing and vision during telephone visit type  Cognitive Function: 6CIT Screen 06/09/2020 06/09/2019  What Year? 0 points 0 points  What month? 0 points 0 points  What time? 0 points 0 points  Count back from 20 0 points 0 points  Months in reverse 0 points 0 points  Repeat phrase 0 points 0 points  Total Score 0 0   (Normal:0-7, Significant for Dysfunction: >8)  Normal Cognitive Function Screening: Yes   Immunization & Health Maintenance Record Immunization History  Administered Date(s) Administered  . Fluad Quad(high Dose 65+) 03/04/2019  . Influenza, High Dose Seasonal PF 03/13/2016, 03/05/2017, 03/07/2018  . Influenza,inj,Quad PF,6+ Mos 03/20/2013, 03/08/2014, 03/15/2015  . Moderna Sars-Covid-2 Vaccination 06/19/2019, 07/17/2019, 04/12/2020  . Pneumococcal Conjugate-13 12/08/2013  . Pneumococcal  Polysaccharide-23 05/28/1997  . Td 05/29/2007  . Tdap 11/05/2016  . Zoster 05/07/2006  . Zoster Recombinat (Shingrix) 05/05/2018, 07/17/2018    Health Maintenance  Topic Date Due  . INFLUENZA VACCINE  12/27/2019  . COVID-19 Vaccine (4 - Booster for Moderna series) 10/10/2020  . TETANUS/TDAP  11/06/2026  . PNA vac Low Risk Adult  Completed       Assessment  This is a routine wellness examination for [name].  Health Maintenance: Due or Overdue Health Maintenance Due  Topic Date Due  . INFLUENZA VACCINE  12/27/2019    [name] does not need a referral for Community Assistance: Patient is under the CCM nurse care Care Management:   no Social Work:    no Prescription Assistance:  no Nutrition/Diabetes Education:  no   Plan:  Personalized Goals Goals Addressed            This Visit's Progress   . AWV       06/09/2020 AWV Goal: Fall Prevention  . Over the next year, patient will decrease their risk for falls by: o Using assistive devices, such as a cane or walker, as needed o Identifying fall risks within their home and correcting them by: - Removing throw rugs - Adding handrails to stairs or ramps - Removing clutter and keeping a clear pathway throughout the home - Increasing light, especially at night - Adding shower handles/bars - Raising toilet seat o Identifying potential personal risk factors for falls: - Medication side effects - Incontinence/urgency - Vestibular dysfunction - Hearing loss - Musculoskeletal disorders - Neurological disorders - Orthostatic hypotension  06/09/2020 AWV Goal: Exercise for General Health  Patient will verbalize understanding of the benefits of increased physical activity:  Exercising regularly is important. It will improve your overall fitness, flexibility, and endurance.  Regular exercise also will improve your overall health. It can help you control your weight, reduce stress, and improve your bone  density.  Over the next year, patient will increase physical activity as tolerated with a goal of at least 150 minutes of moderate physical activity per week.   You can tell that you are exercising at a moderate intensity if your heart starts beating faster and you start breathing faster but can still hold a conversation.  Moderate-intensity exercise ideas include:  Walking 1 mile (1.6 km) in about 15 minutes  Biking  Hiking  Golfing  Dancing  Water aerobics  Patient will verbalize understanding of everyday activities that increase physical activity by providing examples like the following: ? Yard work, such as: ? Pushing a Conservation officer, nature ? Raking and bagging leaves ? Washing your car ? Pushing a stroller ? Shoveling snow ? Gardening ? Washing windows or floors  Patient will be able to explain general safety guidelines for exercising:   Before you start a new exercise program, talk with your health care provider.  Do not exercise so much that you hurt yourself, feel dizzy, or get very short of breath.  Wear comfortable clothes and wear shoes with good support.  Drink plenty of water while you exercise to prevent dehydration or heat stroke.  Work out until your breathing and your heartbeat get faster.       Personalized Health Maintenance & Screening Recommendations  Patient is up to date at this time  Lung Cancer Screening Recommended: no (Low Dose CT Chest recommended if Age 43-80 years, 30 pack-year currently smoking OR have quit w/in past 15 years) Hepatitis C Screening recommended: no HIV Screening recommended: not applicable  Advanced Directives: Written information was not prepared per patient's request.  Referrals & Orders No orders of the defined types were placed in this encounter.   Follow-up Plan . Follow-up with [name], DO as planned . Schedule yearly hearing exam . AVS printed and mailed   I have personally reviewed and noted the  following in the patient's chart:   . Medical and social history . Use of alcohol, tobacco or illicit drugs  . Current medications and supplements . Functional ability and status . Nutritional status . Physical activity . Advanced directives . List of other physicians . Hospitalizations, surgeries, and ER visits in previous 12 months . Vitals . Screenings to include cognitive, depression, and falls . Referrals and appointments  In addition, I have reviewed and discussed with Charles Golden certain preventive protocols, quality metrics, and best practice recommendations. A written personalized care plan for preventive services as well as general preventive health recommendations is available and can be mailed to the patient at his request.      Lynnea Ferrier, LPN  [id number]

## 2020-06-09 NOTE — Patient Instructions (Signed)
[place] Maintenance Summary and Written Plan of Care  [name] ,  Thank you for allowing me to perform your Medicare Annual Wellness Visit and for your ongoing commitment to your health.   Health Maintenance & Immunization History Health Maintenance  Topic Date Due  . COVID-19 Vaccine (4 - Booster for Moderna series) 10/10/2020  . TETANUS/TDAP  11/06/2026  . INFLUENZA VACCINE  Completed  . PNA vac Low Risk Adult  Completed   Immunization History  Administered Date(s) Administered  . Fluad Quad(high Dose 65+) 03/04/2019, 03/15/2020  . Influenza, High Dose Seasonal PF 03/13/2016, 03/05/2017, 03/07/2018  . Influenza,inj,Quad PF,6+ Mos 03/20/2013, 03/08/2014, 03/15/2015  . Moderna Sars-Covid-2 Vaccination 06/19/2019, 07/17/2019, 04/12/2020  . Pneumococcal Conjugate-13 12/08/2013  . Pneumococcal Polysaccharide-23 05/28/1997  . Td 05/29/2007  . Tdap 11/05/2016  . Zoster 05/07/2006  . Zoster Recombinat (Shingrix) 05/05/2018, 07/17/2018    These are the patient goals that we discussed: Goals Addressed            This Visit's Progress   . AWV       06/09/2020 AWV Goal: Fall Prevention  . Over the next year, patient will decrease their risk for falls by: o Using assistive devices, such as a cane or walker, as needed o Identifying fall risks within their home and correcting them by: - Removing throw rugs - Adding handrails to stairs or ramps - Removing clutter and keeping a clear pathway throughout the home - Increasing light, especially at night - Adding shower handles/bars - Raising toilet seat o Identifying potential personal risk factors for falls: - Medication side effects - Incontinence/urgency - Vestibular dysfunction - Hearing loss - Musculoskeletal disorders - Neurological disorders - Orthostatic hypotension  06/09/2020 AWV Goal: Exercise for General Health   Patient will verbalize understanding of the benefits of increased  physical activity:  Exercising regularly is important. It will improve your overall fitness, flexibility, and endurance.  Regular exercise also will improve your overall health. It can help you control your weight, reduce stress, and improve your bone density.  Over the next year, patient will increase physical activity as tolerated with a goal of at least 150 minutes of moderate physical activity per week.   You can tell that you are exercising at a moderate intensity if your heart starts beating faster and you start breathing faster but can still hold a conversation.  Moderate-intensity exercise ideas include:  Walking 1 mile (1.6 km) in about 15 minutes  Biking  Hiking  Golfing  Dancing  Water aerobics  Patient will verbalize understanding of everyday activities that increase physical activity by providing examples like the following: ? Yard work, such as: ? Pushing a Conservation officer, nature ? Raking and bagging leaves ? Washing your car ? Pushing a stroller ? Shoveling snow ? Gardening ? Washing windows or floors  Patient will be able to explain general safety guidelines for exercising:   Before you start a new exercise program, talk with your health care provider.  Do not exercise so much that you hurt yourself, feel dizzy, or get very short of breath.  Wear comfortable clothes and wear shoes with good support.  Drink plenty of water while you exercise to prevent dehydration or heat stroke.  Work out until your breathing and your heartbeat get faster.         This is a list of Health Maintenance Items that are overdue or due now: There are no preventive care reminders to display for this  patient.   Orders/Referrals Placed Today: No orders of the defined types were placed in this encounter.  (Contact our referral department at [phone] if you have not spoken with someone about your referral appointment within the next 5 days)    Follow-up Plan . Follow-up with  [name], DO as planned . Schedule yearly hearing exam

## 2020-06-10 ENCOUNTER — Other Ambulatory Visit: Payer: Self-pay

## 2020-06-10 ENCOUNTER — Ambulatory Visit (INDEPENDENT_AMBULATORY_CARE_PROVIDER_SITE_OTHER): Payer: Medicare Other | Admitting: Family Medicine

## 2020-06-10 VITALS — BP 112/78 | HR 78 | Temp 97.4°F | Wt 172.0 lb

## 2020-06-10 DIAGNOSIS — J449 Chronic obstructive pulmonary disease, unspecified: Secondary | ICD-10-CM | POA: Diagnosis not present

## 2020-06-10 DIAGNOSIS — N1831 Chronic kidney disease, stage 3a: Secondary | ICD-10-CM

## 2020-06-10 DIAGNOSIS — N401 Enlarged prostate with lower urinary tract symptoms: Secondary | ICD-10-CM

## 2020-06-10 DIAGNOSIS — L259 Unspecified contact dermatitis, unspecified cause: Secondary | ICD-10-CM | POA: Diagnosis not present

## 2020-06-10 DIAGNOSIS — R351 Nocturia: Secondary | ICD-10-CM

## 2020-06-10 DIAGNOSIS — R5382 Chronic fatigue, unspecified: Secondary | ICD-10-CM

## 2020-06-10 DIAGNOSIS — J324 Chronic pansinusitis: Secondary | ICD-10-CM

## 2020-06-10 MED ORDER — AZELASTINE HCL 0.1 % NA SOLN: NASAL | 5 refills | Status: DC

## 2020-06-10 MED ORDER — FINASTERIDE 5 MG PO TABS
5.0000 mg | ORAL_TABLET | Freq: Every day | ORAL | 3 refills | Status: DC
Start: 2020-06-10 — End: 2021-04-28

## 2020-06-10 MED ORDER — FLUTICASONE PROPIONATE 50 MCG/ACT NA SUSP: 2.0000 | Freq: Every day | NASAL | 5 refills | Status: DC

## 2020-06-10 MED ORDER — BREO ELLIPTA 200-25 MCG/INH IN AEPB: 1.0000 | INHALATION_SPRAY | Freq: Every day | RESPIRATORY_TRACT | 1 refills | Status: DC

## 2020-06-10 NOTE — Patient Instructions (Signed)
You had labs performed today.  You will be contacted with the results of the labs once they are available, usually in the next 3 business days for routine lab work.  If you have an active my chart account, they will be released to your MyChart.  If you prefer to have these labs released to you via telephone, please let us know.  If you had a pap smear or biopsy performed, expect to be contacted in about 7-10 days.  Use excedrin sparingly.  Prefer tylenol extra strength given your kidney function.

## 2020-06-10 NOTE — Progress Notes (Signed)
Subjective: CC: Follow-up COPD, BPH PCP: [name], DO Charles Golden is a 87 y.o. male presenting to clinic today for:  1.  COPD Patient reports stability with Breo, Ventolin.  He has follow-up with pulmonology next month.  He does have a chronic cough that is most prevalent each morning.  Sometimes this is productive.  He is compliant with his allergy medications.  2. BPH He just had his checkup with Dr. Jeffie Pollock.  He said everything checked out well.  He continues to have some difficulty with stream but overall is able to eliminate the bladder.  Continues on Proscar and Flomax.  No concerns.  3. otitis Patient reports chronic ear itching on the right that is been present for years.  He just wanted to make sure that it is okay to use hydrocortisone as needed on the area.  Sometimes fluid feels like they are in his ears but overall does well   ROS: Per HPI  Allergies  Allergen Reactions  . Demerol [Meperidine] Other (See Comments)    Pt. States "he woke up during a colonoscopy"  . Spiriva Handihaler [Tiotropium Bromide Monohydrate] Other (See Comments)    Mild Urinary Retention   Past Medical History:  Diagnosis Date  . Bronchitis, chronic ([place])   . COPD (chronic obstructive pulmonary disease) ([place])   . Enlarged prostate   . GERD (gastroesophageal reflux disease)   . Glaucoma   . Glucagonoma   . Hernia, incisional    at present  . HOH (hard of hearing)   . IBS (irritable bowel syndrome)   . Sciatic pain   . Sinus congestion     Current Outpatient Medications:  .  albuterol (PROVENTIL) (2.5 MG/3ML) 0.083% nebulizer solution, Take 3 mLs (2.5 mg total) by nebulization every 6 (six) hours as needed for wheezing or shortness of breath., Disp: 360 mL, Rfl: 1 .  albuterol (VENTOLIN HFA) 108 (90 Base) MCG/ACT inhaler, Inhale 2 puffs into the lungs every 6 (six) hours as needed for wheezing or shortness of breath., Disp: 18 g, Rfl: 11 .  azelastine (ASTELIN) 0.1 %  nasal spray, PLACE 1 SPRAY INTO THE NOSE 1 DAY OR 1 DOSE. USE IN EACH NOSTRIL AS DIRECTED, Disp: 30 mL, Rfl: 5 .  BREO ELLIPTA 200-25 MCG/INH AEPB, Inhale 1 puff into the lungs daily., Disp: 60 each, Rfl: 1 .  Cholecalciferol (VITAMIN D3) 2000 UNITS TABS, Take 1 tablet by mouth daily., Disp: , Rfl:  .  Cyanocobalamin (VITAMIN B-12) 5000 MCG SUBL, Place 1 tablet under the tongue daily., Disp: , Rfl:  .  finasteride (PROSCAR) 5 MG tablet, Take 1 tablet (5 mg total) by mouth daily., Disp: 90 tablet, Rfl: 3 .  fluticasone (FLONASE) 50 MCG/ACT nasal spray, 1 SPRAY IN EACH NOSTRIL ONCE A DAY, Disp: 16 g, Rfl: 5 .  guaiFENesin (MUCINEX) 600 MG 12 hr tablet, Take 600 mg by mouth as needed. Reported on 05/25/2015, Disp: , Rfl:  .  latanoprost (XALATAN) 0.005 % ophthalmic solution, Place 1 drop into the left eye at bedtime., Disp: 2.5 mL, Rfl: 1 .  sodium chloride HYPERTONIC 3 % nebulizer solution, Take by nebulization 2 (two) times daily. (Patient not taking: No sig reported), Disp: 750 mL, Rfl: 1 .  tamsulosin (FLOMAX) 0.4 MG CAPS capsule, Take 0.4 mg by mouth daily., Disp: , Rfl:  .  timolol (TIMOPTIC) 0.5 % ophthalmic solution, Place 1 drop into the left eye every morning., Disp: , Rfl:  Social History  Socioeconomic History  . Marital status: Married    Spouse name: [name]  . Number of children: 3  . Years of education: 12+  . Highest education level: Bachelor's degree (e.g., BA, AB, BS)  Occupational History  . Occupation: Retired    Comment: Immunologist  Tobacco Use  . Smoking status: Former Smoker    Packs/day: 1.00    Years: 30.00    Pack years: 30.00    Types: Cigarettes    Quit date: 05/28/2006    Years since quitting: 14.0  . Smokeless tobacco: Never Used  Vaping Use  . Vaping Use: Never used  Substance and Sexual Activity  . Alcohol use: Not Currently    Alcohol/week: 0.0 standard drinks  . Drug use: No  . Sexual activity: Yes    Birth control/protection: None  Other  Topics Concern  . Not on file  Social History Narrative   Patient lives at home with his wife [name].    Patient has 3 children.    Patient has his BS   Patient is retired.    Drinks about 2 cups of coffee per day.       Pulmonary:   He is still married. 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 He is from [place].       Social Determinants of Health   Financial Resource Strain: Low Risk   . Difficulty of Paying Living Expenses: Not hard at all  Food Insecurity: Not on file  Transportation Needs: No Transportation Needs  . Lack of Transportation (Medical): No  . Lack of Transportation (Non-Medical): No  Physical Activity: Not on file  Stress: Not on file  Social Connections: Not on file  Intimate Partner Violence: Not on file   Family History  Problem Relation Age of Onset  . Heart disease Mother        Valve replacement and pacemaker  . Colon cancer Father   . Asthma Other        cousin  . Healthy Daughter   . Healthy Daughter   . Healthy Daughter     Objective: Office vital signs reviewed. BP 112/78   Pulse 78   Temp (!) 97.4 F (36.3 C)   Wt 172 lb (78 kg)   SpO2 98%   BMI 24.68 kg/m   Physical Examination:  General: Awake, alert, well nourished, No acute distress HEENT: Normal; right ear with external auditory canal dermatitis.  TMs intact. Cardio: regular rate and rhythm, S1S2 heard, no murmurs appreciated Pulm: clear to auscultation bilaterally, no wheezes, rhonchi or rales; normal work of breathing on room air Extremities: warm, well perfused, No edema, cyanosis or clubbing; +2 pulses bilaterally MSK: Ambulating independently with normal tone Assessment/ Plan: 87 y.o. male   Stage 3a chronic kidney disease ([place]) - Plan: Renal Function Panel, CBC, VITAMIN D 25 Hydroxy (Vit-D Deficiency, Fractures)  COPD mixed type (HCC)  Chronic fatigue  Benign prostatic hyperplasia  with nocturia - Plan: finasteride (PROSCAR) 5 MG tablet, PSA  Chronic pansinusitis - Plan: fluticasone (FLONASE) 50 MCG/ACT nasal spray  Contact dermatitis of external ear  Check renal function.  Advised against regular use of any NSAIDs  COPD stable.  Keep follow-up with pulmonology  Fatigue is stable.  BPH stable.  Renewal of Proscar.  Check PSA  Pansinusitis is stable.  Renewal of Astelin Flonase  Okay to use as needed hydrocortisone.  Advised against use for longer than 7  to 10 days at a time  No orders of the defined types were placed in this encounter.  No orders of the defined types were placed in this encounter.    [name], DO [place] [phone]

## 2020-06-11 LAB — RENAL FUNCTION PANEL
Albumin: 3.9 g/dL (ref 3.5–4.6)
BUN/Creatinine Ratio: 13 (ref 10–24)
BUN: 20 mg/dL (ref 10–36)
CO2: 22 mmol/L (ref 20–29)
Calcium: 9.5 mg/dL (ref 8.6–10.2)
Chloride: 108 mmol/L — ABNORMAL HIGH (ref 96–106)
Creatinine, Ser: 1.55 mg/dL — ABNORMAL HIGH (ref 0.76–1.27)
GFR calc Af Amer: 44 mL/min/{1.73_m2} — ABNORMAL LOW (ref >59)
GFR calc non Af Amer: 38 mL/min/{1.73_m2} — ABNORMAL LOW (ref >59)
Glucose: 85 mg/dL (ref 65–99)
Phosphorus: 3.2 mg/dL (ref 2.8–4.1)
Potassium: 5.2 mmol/L (ref 3.5–5.2)
Sodium: 145 mmol/L — ABNORMAL HIGH (ref 134–144)

## 2020-06-11 LAB — CBC
Hematocrit: 44.2 % (ref 37.5–51.0)
Hemoglobin: 15 g/dL (ref 13.0–17.7)
MCH: 31.1 pg (ref 26.6–33.0)
MCHC: 33.9 g/dL (ref 31.5–35.7)
MCV: 92 fL (ref 79–97)
Platelets: 291 10*3/uL (ref 150–450)
RBC: 4.83 x10E6/uL (ref 4.14–5.80)
RDW: 12.6 % (ref 11.6–15.4)
WBC: 8.3 10*3/uL (ref 3.4–10.8)

## 2020-06-11 LAB — VITAMIN D 25 HYDROXY (VIT D DEFICIENCY, FRACTURES): Vit D, 25-Hydroxy: 35.9 ng/mL (ref 30.0–100.0)

## 2020-06-11 LAB — PSA: Prostate Specific Ag, Serum: 2.3 ng/mL (ref 0.0–4.0)

## 2020-07-22 ENCOUNTER — Other Ambulatory Visit: Payer: Self-pay

## 2020-07-22 ENCOUNTER — Ambulatory Visit (INDEPENDENT_AMBULATORY_CARE_PROVIDER_SITE_OTHER): Payer: Medicare Other | Admitting: Pulmonary Disease

## 2020-07-22 ENCOUNTER — Encounter: Payer: Self-pay | Admitting: Pulmonary Disease

## 2020-07-22 VITALS — BP 124/80 | HR 57 | Temp 97.7°F | Ht 70.0 in | Wt 175.0 lb

## 2020-07-22 DIAGNOSIS — J479 Bronchiectasis, uncomplicated: Secondary | ICD-10-CM

## 2020-07-22 DIAGNOSIS — J449 Chronic obstructive pulmonary disease, unspecified: Secondary | ICD-10-CM

## 2020-07-22 NOTE — Progress Notes (Signed)
[facility]  Chief Complaint  Patient presents with  . Follow-up    Sob same. Breo and albuterol working okay.     Constitutional:  BP 124/80 (BP Location: Right Arm, Cuff Size: Normal)   Pulse (!) 57   Temp 97.7 F (36.5 C)   Ht 5\' 10"  (1.778 m)   Wt 175 lb (79.4 kg)   SpO2 100%   BMI 25.11 kg/m   Past Medical History:  Sciatica, IBS, Glaucoma, Glucagonoma, GERD, BPH  Past Surgical History:  He  has a past surgical history that includes Hemorrhoid surgery; laparotomy (N/A, 09/04/2012); Bowel resection (N/A, 09/04/2012); Inguinal hernia repair (Left, 09/14/2013); Insertion of mesh (Left, 09/14/2013); Tonsillectomy; Knee arthroscopy (Left, 07/21/2014); Skin lesion excision; Hernia repair (Bilateral, 70's); and Inguinal hernia repair (Right, 12/23/2018).  Brief Summary:  Charles Golden is a 87 y.o. male former smoker with BTX, COPD/emphysema and GERD.      Subjective:   He has cough with clear to yellow phlegm on daily basis.  Comes up easily.  Not having fever, chest pain, wheeze, or hemoptysis.  Gets winded if he walks to fast.  Feels sleepy and fatigued during the day.  Sleeps about 12 hrs per day.  Physical Exam:   Appearance - well kempt   ENMT - no sinus tenderness, no oral exudate, no LAN, Mallampati 2 airway, no stridor  Respiratory - decreased breath sounds bilaterally, no wheezing or rales  CV - s1s2 regular rate and rhythm, no murmurs  Ext - no clubbing, no edema  Skin - no rashes  Psych - normal mood and affect   Pulmonary testing:   A1AT 04/11/15 >> 153, MM  PFT 07/13/15 >> FEV1 2.40 (92%), FEV1% 66, TLC 8.07 (112%), DLCO 75%, +BD  Chest Imaging:   CT chest 04/19/15 >> mild cylindrical BTX in bases, mild centrilobular emphysema, calcified mediastinal LN  Sleep Tests:   PSG 11/15/16 >> AHI 3.6  Social History:  He  reports that he quit smoking about 14 years ago. His smoking use included cigarettes. He has  a 30.00 pack-year smoking history. He has never used smokeless tobacco. He reports previous alcohol use. He reports that he does not use drugs.  Family History:  His family history includes Asthma in an other family member; Colon cancer in his father; Healthy in his daughter, daughter, and daughter; Heart disease in his mother.     Assessment/Plan:   Bronchiectasis. - prn albuterol - hypertonic saline on backorder - advised he could use mucinex bid - advised he could try using flutter valve - discussed symptoms to monitor for that would indicate he has an infection developing, and would then need antibiotic therapy  COPD with chronic bronchitis and emphysema. - continue breo - explained that albuterol nebulizer and ventolin can be used interchangeably  Allergic rhinitis. - prn azelastine, and fluticasone  Time Spent Involved in Patient Care on Day of Examination:  22 minutes  Follow up:  Patient Instructions  Follow up in 6 months   Medication List:   Allergies as of 07/22/2020      Reactions   Demerol [meperidine] Other (See Comments)   Pt. States "he woke up during a colonoscopy"   Spiriva Handihaler [tiotropium Bromide Monohydrate] Other (See Comments)   Mild Urinary Retention      Medication List       Accurate as of July 22, 2020 12:12 PM. If you have any questions, ask your nurse or doctor.  albuterol (2.5 MG/3ML) 0.083% nebulizer solution Commonly known as: PROVENTIL Take 3 mLs (2.5 mg total) by nebulization every 6 (six) hours as needed for wheezing or shortness of breath.   albuterol 108 (90 Base) MCG/ACT inhaler Commonly known as: VENTOLIN HFA Inhale 2 puffs into the lungs every 6 (six) hours as needed for wheezing or shortness of breath.   azelastine 0.1 % nasal spray Commonly known as: ASTELIN PLACE 1 SPRAY INTO THE NOSE 1 DAY OR 1 DOSE. USE IN EACH NOSTRIL AS DIRECTED   Breo Ellipta 200-25 MCG/INH Aepb Generic drug: fluticasone  furoate-vilanterol Inhale 1 puff into the lungs daily.   finasteride 5 MG tablet Commonly known as: PROSCAR Take 1 tablet (5 mg total) by mouth daily.   fluticasone 50 MCG/ACT nasal spray Commonly known as: FLONASE Place 2 sprays into both nostrils daily.   guaiFENesin 600 MG 12 hr tablet Commonly known as: MUCINEX Take 600 mg by mouth as needed. Reported on 05/25/2015   latanoprost 0.005 % ophthalmic solution Commonly known as: XALATAN Place 1 drop into the left eye at bedtime.   tamsulosin 0.4 MG Caps capsule Commonly known as: FLOMAX Take 0.4 mg by mouth daily.   timolol 0.5 % ophthalmic solution Commonly known as: TIMOPTIC Place 1 drop into the left eye every morning.   Vitamin B-12 5000 MCG Subl Place 1 tablet under the tongue daily.   Vitamin D3 50 MCG (2000 UT) Tabs Take 1 tablet by mouth daily.       Signature:  [name], MD [place] Pager - [phone] 07/22/2020, 12:12 PM

## 2020-07-22 NOTE — Patient Instructions (Signed)
Follow up in 6 months 

## 2020-07-26 IMAGING — CR DG ABDOMEN 1V
1 series · 1 of 1 positions shown · non-contrast
Comparison: March 20, 2016

CLINICAL DATA: Diarrhea for a month. General discomfort. Evaluate
for obstruction.

EXAM:
ABDOMEN - 1 VIEW

[w abdomen upright]
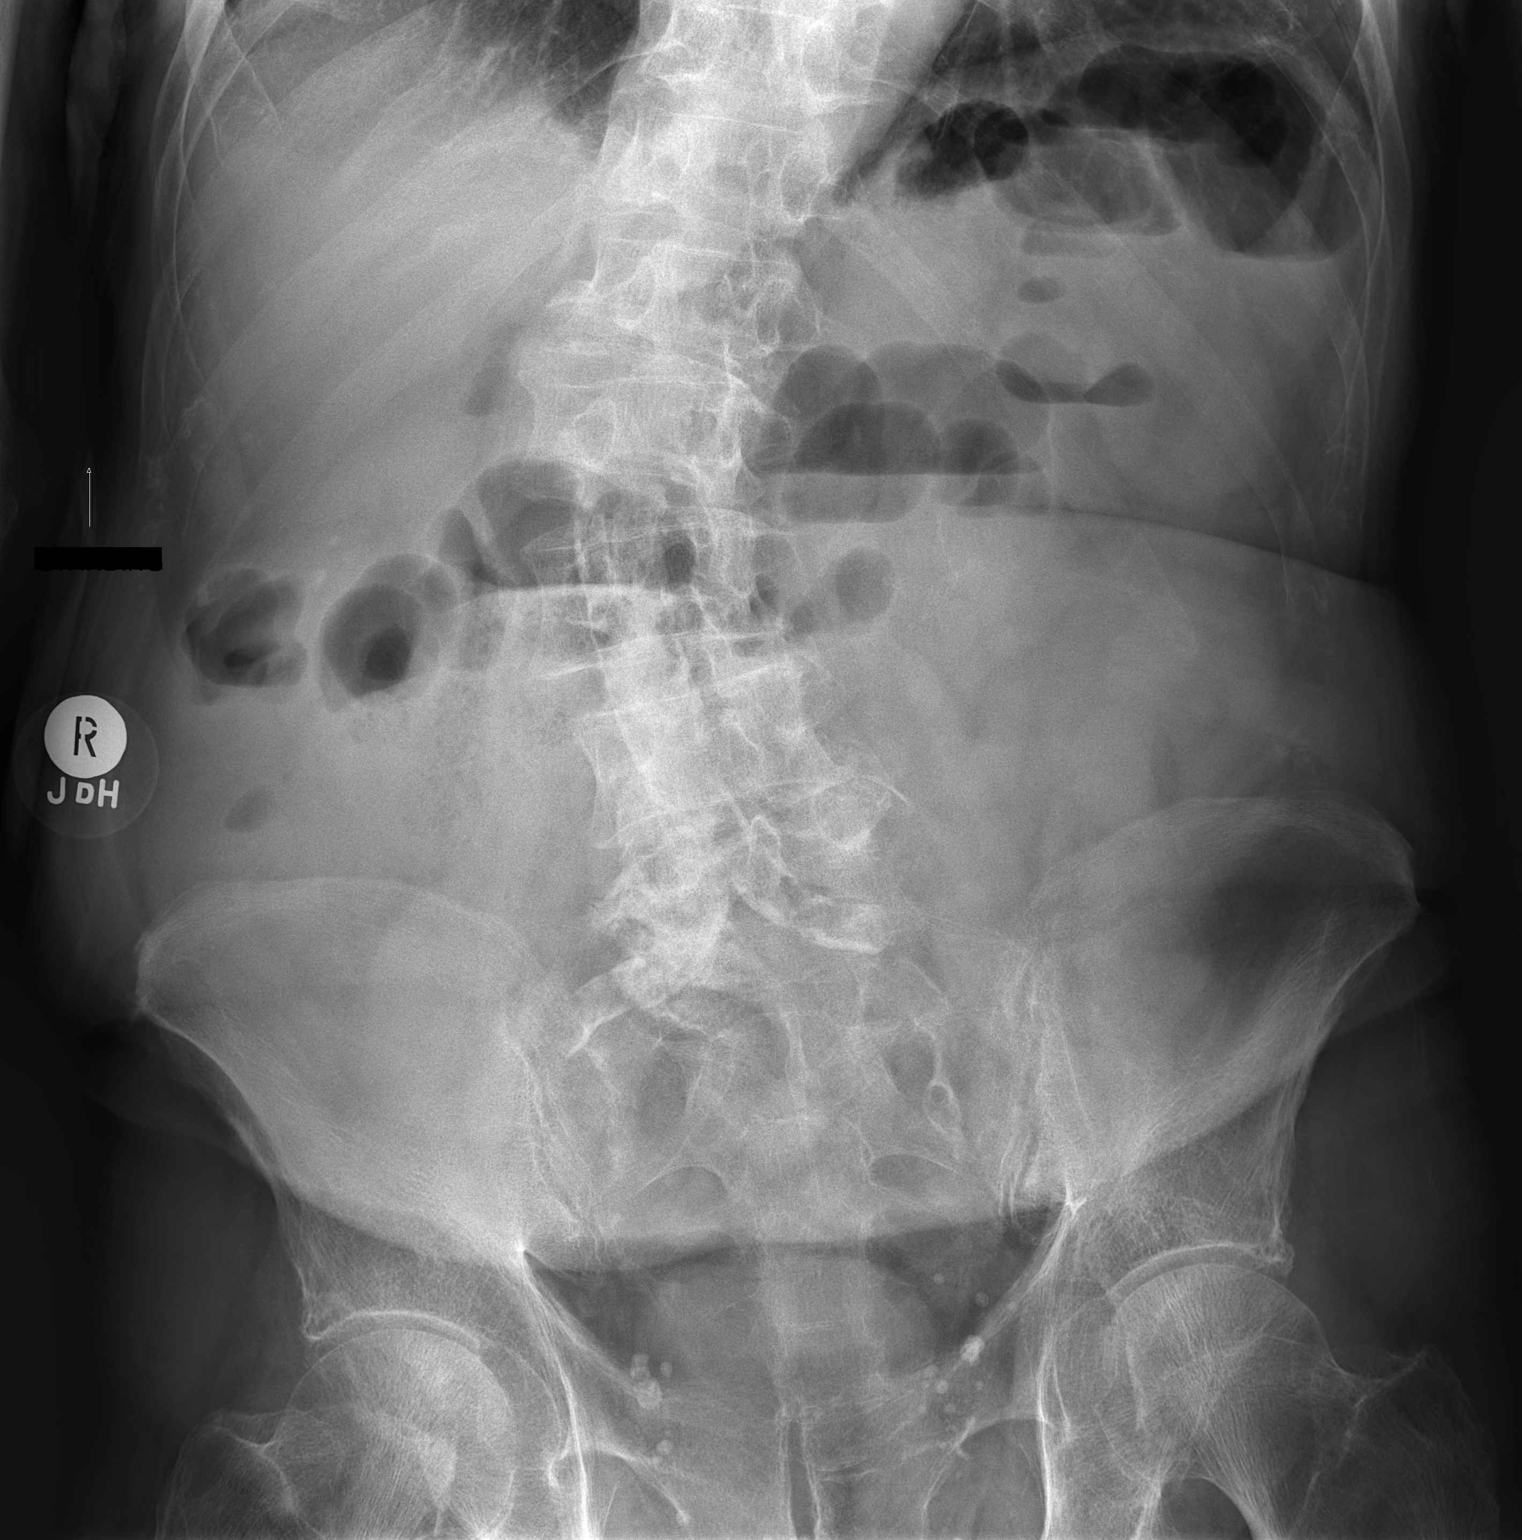

[1 of 1 positions shown; findings below may reference images not displayed]

FINDINGS: Multiple loops of small bowel are seen in the central abdomen with
air-fluid levels. Small bowel loops are mildly prominent measuring
up to 3.8 cm. There is no significant colonic gas noted. No free
air, portal venous gas, or pneumatosis.
IMPRESSION: Mildly dilated loops of small bowel with air-fluid levels and a
paucity of colonic gas. The findings suggest the possibility of a
relatively subtle small-bowel obstruction. Ileus or enteritis are
possibilities. CT imaging could better evaluate if clinically
warranted.

These results will be called to the ordering clinician or
representative by the Radiologist Assistant, and communication
documented in the PACS or zVision Dashboard.

## 2020-09-17 ENCOUNTER — Other Ambulatory Visit: Payer: Self-pay | Admitting: Family Medicine

## 2020-10-05 ENCOUNTER — Other Ambulatory Visit: Payer: Self-pay | Admitting: Family Medicine

## 2020-10-05 DIAGNOSIS — J324 Chronic pansinusitis: Secondary | ICD-10-CM

## 2020-11-10 DIAGNOSIS — Z961 Presence of intraocular lens: Secondary | ICD-10-CM | POA: Diagnosis not present

## 2020-11-10 DIAGNOSIS — H401122 Primary open-angle glaucoma, left eye, moderate stage: Secondary | ICD-10-CM | POA: Diagnosis not present

## 2020-11-10 DIAGNOSIS — H40051 Ocular hypertension, right eye: Secondary | ICD-10-CM | POA: Diagnosis not present

## 2020-11-17 ENCOUNTER — Other Ambulatory Visit: Payer: Self-pay | Admitting: Family Medicine

## 2020-11-17 DIAGNOSIS — J449 Chronic obstructive pulmonary disease, unspecified: Secondary | ICD-10-CM

## 2020-12-07 DIAGNOSIS — R3914 Feeling of incomplete bladder emptying: Secondary | ICD-10-CM | POA: Diagnosis not present

## 2020-12-07 DIAGNOSIS — R3912 Poor urinary stream: Secondary | ICD-10-CM | POA: Diagnosis not present

## 2020-12-07 DIAGNOSIS — N401 Enlarged prostate with lower urinary tract symptoms: Secondary | ICD-10-CM | POA: Diagnosis not present

## 2020-12-07 DIAGNOSIS — N481 Balanitis: Secondary | ICD-10-CM | POA: Diagnosis not present

## 2020-12-07 DIAGNOSIS — N3943 Post-void dribbling: Secondary | ICD-10-CM | POA: Diagnosis not present

## 2020-12-13 ENCOUNTER — Encounter: Payer: Self-pay | Admitting: Family Medicine

## 2020-12-13 ENCOUNTER — Ambulatory Visit (INDEPENDENT_AMBULATORY_CARE_PROVIDER_SITE_OTHER): Payer: Medicare Other | Admitting: Family Medicine

## 2020-12-13 ENCOUNTER — Other Ambulatory Visit: Payer: Self-pay

## 2020-12-13 VITALS — BP 142/92 | HR 96 | Temp 98.2°F | Ht 70.0 in | Wt 175.6 lb

## 2020-12-13 DIAGNOSIS — N401 Enlarged prostate with lower urinary tract symptoms: Secondary | ICD-10-CM | POA: Diagnosis not present

## 2020-12-13 DIAGNOSIS — N1831 Chronic kidney disease, stage 3a: Secondary | ICD-10-CM

## 2020-12-13 DIAGNOSIS — J449 Chronic obstructive pulmonary disease, unspecified: Secondary | ICD-10-CM | POA: Diagnosis not present

## 2020-12-13 DIAGNOSIS — D692 Other nonthrombocytopenic purpura: Secondary | ICD-10-CM | POA: Diagnosis not present

## 2020-12-13 DIAGNOSIS — E538 Deficiency of other specified B group vitamins: Secondary | ICD-10-CM | POA: Diagnosis not present

## 2020-12-13 DIAGNOSIS — R351 Nocturia: Secondary | ICD-10-CM

## 2020-12-13 DIAGNOSIS — I700 Atherosclerosis of aorta: Secondary | ICD-10-CM | POA: Diagnosis not present

## 2020-12-13 DIAGNOSIS — G44229 Chronic tension-type headache, not intractable: Secondary | ICD-10-CM | POA: Diagnosis not present

## 2020-12-13 MED ORDER — TRELEGY ELLIPTA 200-62.5-25 MCG/INH IN AEPB: 1.0000 | INHALATION_SPRAY | Freq: Every day | RESPIRATORY_TRACT | 0 refills | Status: DC

## 2020-12-13 NOTE — Progress Notes (Signed)
Charles Golden is a 87 y.o. male presents to office today for annual physical exam examination.    Concerns today include: 1.  COPD Patient with history of COPD.  He is currently under the care of pulmonology with last office visit in February.  He reports compliance with Breo, as needed albuterol.  He does feel that breathing is somewhat worse than his normal, citing that he feels short of breath both during activity and seated now.  No edema reported  2.  Chronic headache Patient reports a chronic headache that requires Excedrin to treat roughly 3-4 times per week.  This has been an issue for him for several decades and he used to be seen by a neurologist for this.  He does report some easy bruising but otherwise feels that symptoms are well controlled  3.  BPH Patient reports dribbling with urination.  He saw Dr. Jeffie Pollock recently and his Flomax was advanced to 0.8 mg daily, Proscar was continued.  Urinalysis was collected but they have ceased checking his PSA for many years now.  Occupation: retired, Marital status: married, Substance use: former smoker that quit in 2008. Diet: balanced, Exercise: tries to stay active but has chronic fatigue Last eye exam: UTD Last dental exam: UTD Last colonoscopy: UTD Refills needed today: none Immunizations needed: Immunization History  Administered Date(s) Administered   Fluad Quad(high Dose 65+) 03/04/2019, 03/15/2020   Influenza, High Dose Seasonal PF 03/13/2016, 03/05/2017, 03/07/2018   Influenza,inj,Quad PF,6+ Mos 03/20/2013, 03/08/2014, 03/15/2015   Moderna Sars-Covid-2 Vaccination 06/19/2019, 07/17/2019, 04/12/2020   Pneumococcal Conjugate-13 12/08/2013   Pneumococcal Polysaccharide-23 05/28/1997   Td 05/29/2007   Tdap 11/05/2016   Zoster Recombinat (Shingrix) 05/05/2018, 07/17/2018   Zoster, Live 05/07/2006     Past Medical History:  Diagnosis Date   Bronchitis, chronic (HCC)    COPD (chronic obstructive pulmonary disease) (HCC)     Enlarged prostate    GERD (gastroesophageal reflux disease)    Glaucoma    Glucagonoma    Hernia, incisional    at present   HOH (hard of hearing)    IBS (irritable bowel syndrome)    Sciatic pain    Sinus congestion    Social History   Socioeconomic History   Marital status: Married    Spouse name: [name]   Number of children: 3   Years of education: 12+   Highest education level: Bachelor's degree (e.g., BA, AB, BS)  Occupational History   Occupation: Retired    Comment: Immunologist  Tobacco Use   Smoking status: Former    Packs/day: 1.00    Years: 30.00    Pack years: 30.00    Types: Cigarettes    Quit date: 05/28/2006    Years since quitting: 14.5   Smokeless tobacco: Never  Vaping Use   Vaping Use: Never used  Substance and Sexual Activity   Alcohol use: Not Currently    Alcohol/week: 0.0 standard drinks   Drug use: No   Sexual activity: Yes    Birth control/protection: None  Other Topics Concern   Not on file  Social History Narrative   Patient lives at home with [name].    Patient has 3 children.    Patient has his BS   Patient is retired.    Drinks about 2 cups of coffee per day.      [facility]:   He is still married. 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 He is from [place].       Social Determinants of Health   Financial Resource Strain: Low Risk    Difficulty of Paying Living Expenses: Not hard at all  Food Insecurity: Not on file  Transportation Needs: No Transportation Needs   Lack of Transportation (Medical): No   Lack of Transportation (Non-Medical): No  Physical Activity: Not on file  Stress: Not on file  Social Connections: Not on file  Intimate Partner Violence: Not on file   Past Surgical History:  Procedure Laterality Date   BOWEL RESECTION N/A 09/04/2012   Procedure: SMALL BOWEL RESECTION;  Surgeon: Donato Heinz, MD;  Location:  [facility];  Service: General;  Laterality: N/A;  Anastimosis   HEMORRHOID SURGERY     HERNIA REPAIR Bilateral 70's   INGUINAL HERNIA REPAIR Left 09/14/2013   Procedure: LEFT INGUINAL HERNIORRHAPHY;  Surgeon: Jamesetta So, MD;  Location: [facility];  Service: General;  Laterality: Left;   INGUINAL HERNIA REPAIR Right 12/23/2018   Procedure: RECURRENT RIGHT INGUINAL HERNIA  REPAIR  WITH MESH;  Surgeon: Aviva Signs, MD;  Location: [facility];  Service: General;  Laterality: Right;   INSERTION OF MESH Left 09/14/2013   Procedure: INSERTION OF MESH;  Surgeon: Jamesetta So, MD;  Location: [facility];  Service: General;  Laterality: Left;   KNEE ARTHROSCOPY Left 07/21/2014   Procedure: LEFT KNEE ARTHROSCOPY WITH MEDIAL MENISCAL DEBRIDEMENT ;  Surgeon: Gearlean Alf, MD;  Location: [facility];  Service: Orthopedics;  Laterality: Left;   LAPAROTOMY N/A 09/04/2012   Procedure: EXPLORATORY LAPAROTOMY;  Surgeon: Donato Heinz, MD;  Location: [facility];  Service: General;  Laterality: N/A;   SKIN LESION EXCISION     [name]   TONSILLECTOMY     Family History  Problem Relation Age of Onset   Heart disease Mother        Valve replacement and pacemaker   Colon cancer Father    Asthma Other        cousin   Healthy Daughter    Healthy Daughter    Healthy Daughter     Current Outpatient Medications:    azelastine (ASTELIN) 0.1 % nasal spray, PLACE 1 SPRAY INTO THE NOSE 1 DAY OR 1 DOSE. USE IN EACH NOSTRIL AS DIRECTED, Disp: 30 mL, Rfl: 5   BREO ELLIPTA 200-25 MCG/INH AEPB, Inhale 1 puff into the lungs daily., Disp: 60 each, Rfl: 2   Cholecalciferol (VITAMIN D3) 2000 UNITS TABS, Take 1 tablet by mouth daily., Disp: , Rfl:    Cyanocobalamin (VITAMIN B-12) 5000 MCG SUBL, Place 1 tablet under the tongue daily., Disp: , Rfl:    finasteride (PROSCAR) 5 MG tablet, Take 1 tablet (5 mg total) by mouth daily., Disp: 90 tablet, Rfl: 3   fluticasone (FLONASE) 50 MCG/ACT nasal spray, 1 SPRAY IN EACH NOSTRIL ONCE A DAY, Disp:  16 g, Rfl: 5   guaiFENesin (MUCINEX) 600 MG 12 hr tablet, Take 600 mg by mouth as needed. Reported on 05/25/2015, Disp: , Rfl:    latanoprost (XALATAN) 0.005 % ophthalmic solution, Place 1 drop into the left eye at bedtime., Disp: 2.5 mL, Rfl: 1   tamsulosin (FLOMAX) 0.4 MG CAPS capsule, Take 0.4 mg by mouth daily., Disp: , Rfl:    timolol (TIMOPTIC) 0.5 % ophthalmic solution, Place 1 drop into the left eye every morning., Disp: , Rfl:    VENTOLIN HFA 108 (90 Base) MCG/ACT inhaler, INHALE 2 PUFFS EVERY 6 HOURS  AS NEEDED FOR WHEEZING OR SHORTNESS OF BREATH, Disp: 18 g, Rfl: 0   albuterol (PROVENTIL) (2.5 MG/3ML) 0.083% nebulizer solution, Take 3 mLs (2.5 mg total) by nebulization every 6 (six) hours as needed for wheezing or shortness of breath. (Patient not taking: Reported on 12/13/2020), Disp: 360 mL, Rfl: 1  Allergies  Allergen Reactions   Demerol [Meperidine] Other (See Comments)    Pt. States "he woke up during a colonoscopy"   Spiriva Handihaler [Tiotropium Bromide Monohydrate] Other (See Comments)    Mild Urinary Retention     ROS: Review of Systems Pertinent items noted in HPI and remainder of comprehensive ROS otherwise negative.    Physical exam BP (!) 142/92   Pulse 96   Temp 98.2 F (36.8 C)   Ht 5\' 10"  (1.778 m)   Wt 175 lb 9.6 oz (79.7 kg)   SpO2 96%   BMI 25.20 kg/m  General appearance: alert, cooperative, appears stated age, and no distress Head: Normocephalic, without obvious abnormality, atraumatic Eyes: negative findings: lids and lashes normal, conjunctivae and sclerae normal, corneas clear, and pupils equal, round, reactive to light and accomodation Ears: normal TM's and external ear canals both ears Nose: Nares normal. Septum midline. Mucosa normal. No drainage or sinus tenderness. Throat:  Dentures on the bottom partial on top.  Oropharynx clear.  Mallampati 1 Neck: no adenopathy, supple, symmetrical, trachea midline, and thyroid not enlarged, symmetric, no  tenderness/mass/nodules Back: symmetric, no curvature. ROM normal. No CVA tenderness. Lungs: clear to auscultation bilaterally Chest wall: no tenderness Heart: regular rate and rhythm, S1, S2 normal, no murmur, click, rub or gallop Abdomen: soft, non-tender; bowel sounds normal; no masses,  no organomegaly Extremities: extremities normal, atraumatic, no cyanosis or edema Pulses: 2+ and symmetric Skin:  Senile purpura noted along the right upper extremity Lymph nodes: Cervical, supraclavicular, and axillary nodes normal. Neurologic: Alert and oriented X 3, normal strength and tone. Normal symmetric reflexes. Normal coordination and gait    Assessment/ Plan: Charles Golden here for annual physical exam.   COPD mixed type ([place])  Chronic tension-type headache, not intractable  Benign prostatic hyperplasia with nocturia - Plan: CANCELED: PSA  Stage 3a chronic kidney disease ([place]) - Plan: Renal Function Panel, CBC  Thoracic aortic atherosclerosis ([place])  Vitamin B12 deficiency - Plan: Vitamin B12  Purpura senilis ([place])  Subjectively not well controlled COPD.  His pulmonary exam was fairly unremarkable today however.  He has good saturation on room air with no increased work of breathing.  I have given him a sample of Trelegy to see if perhaps this would help with his symptoms.  He does have a history noted for possible urinary retention with Spiriva so I did counsel him on this.  He understands to discontinue use of Trelegy should he develop any decreased urinary output.  Of note he was recently treated for BPH with advancement of his Flomax to 0.8 mg daily.    His headaches sound to be chronic and stable.  Caution use of aspirin given CKD 3 a.  I suspect that easy bruisability and senile purpura noted on exam may be related to this aspirin use  B12 added to today's labs given chronic fatigability and history of B12 deficiency   Charles Golden M. [name], DO

## 2020-12-13 NOTE — Patient Instructions (Addendum)
TRIAL of Trelegy (IN PLACE OF BREO).  This has medication similar to Spiriva.  If you notice decreased urine output, STOP trelegy.  You had labs performed today.  You will be contacted with the results of the labs once they are available, usually in the next 3 business days for routine lab work.  If you have an active my chart account, they will be released to your MyChart.  If you prefer to have these labs released to you via telephone, please let us know.   Preventive Care 3 Years and Older, Male Preventive care refers to lifestyle choices and visits with your health care provider that can promote health and wellness. This includes: A yearly physical exam. This is also called an annual wellness visit. Regular dental and eye exams. Immunizations. Screening for certain conditions. Healthy lifestyle choices, such as: Eating a healthy diet. Getting regular exercise. Not using drugs or products that contain nicotine and tobacco. Limiting alcohol use. What can I expect for my preventive care visit? Physical exam Your health care provider will check your: Height and weight. These may be used to calculate your BMI (body mass index). BMI is a measurement that tells if you are at a healthy weight. Heart rate and blood pressure. Body temperature. Skin for abnormal spots. Counseling Your health care provider may ask you questions about your: Past medical problems. Family's medical history. Alcohol, tobacco, and drug use. Emotional well-being. Home life and relationship well-being. Sexual activity. Diet, exercise, and sleep habits. History of falls. Memory and ability to understand (cognition). Work and work Statistician. Access to firearms. What immunizations do I need?  Vaccines are usually given at various ages, according to a schedule. Your health care provider will recommend vaccines for you based on your age, medicalhistory, and lifestyle or other factors, such as travel or where you  work. What tests do I need? Blood tests Lipid and cholesterol levels. These may be checked every 5 years, or more often depending on your overall health. Hepatitis C test. Hepatitis B test. Screening Lung cancer screening. You may have this screening every year starting at age 35 if you have a 30-pack-year history of smoking and currently smoke or have quit within the past 15 years. Colorectal cancer screening. All adults should have this screening starting at age 63 and continuing until age 32. Your health care provider may recommend screening at age 48 if you are at increased risk. You will have tests every 1-10 years, depending on your results and the type of screening test. Prostate cancer screening. Recommendations will vary depending on your family history and other risks. Genital exam to check for testicular cancer or hernias. Diabetes screening. This is done by checking your blood sugar (glucose) after you have not eaten for a while (fasting). You may have this done every 1-3 years. Abdominal aortic aneurysm (AAA) screening. You may need this if you are a current or former smoker. STD (sexually transmitted disease) testing, if you are at risk. Follow these instructions at home: Eating and drinking  Eat a diet that includes fresh fruits and vegetables, whole grains, lean protein, and low-fat dairy products. Limit your intake of foods with high amounts of sugar, saturated fats, and salt. Take vitamin and mineral supplements as recommended by your health care provider. Do not drink alcohol if your health care provider tells you not to drink. If you drink alcohol: Limit how much you have to 0-2 drinks a day. Be aware of how much alcohol is in  your drink. In the [place] one drink equals one 12 oz bottle of beer (355 mL), one 5 oz glass of wine (148 mL), or one 1 oz glass of hard liquor (44 mL).  Lifestyle Take daily care of your teeth and gums. Brush your teeth every morning and night  with fluoride toothpaste. Floss one time each day. Stay active. Exercise for at least 30 minutes 5 or more days each week. Do not use any products that contain nicotine or tobacco, such as cigarettes, e-cigarettes, and chewing tobacco. If you need help quitting, ask your health care provider. Do not use drugs. If you are sexually active, practice safe sex. Use a condom or other form of protection to prevent STIs (sexually transmitted infections). Talk with your health care provider about taking a low-dose aspirin or statin. Find healthy ways to cope with stress, such as: Meditation, yoga, or listening to music. Journaling. Talking to a trusted person. Spending time with friends and family. Safety Always wear your seat belt while driving or riding in a vehicle. Do not drive: If you have been drinking alcohol. Do not ride with someone who has been drinking. When you are tired or distracted. While texting. Wear a helmet and other protective equipment during sports activities. If you have firearms in your house, make sure you follow all gun safety procedures. What's next? Visit your health care provider once a year for an annual wellness visit. Ask your health care provider how often you should have your eyes and teeth checked. Stay up to date on all vaccines. This information is not intended to replace advice given to you by your health care provider. Make sure you discuss any questions you have with your healthcare provider. Document Revised: 02/10/2019 Document Reviewed: 05/08/2018 Elsevier Patient Education  2022 [employer].

## 2020-12-14 LAB — RENAL FUNCTION PANEL
Albumin: 4.1 g/dL (ref 3.5–4.6)
BUN/Creatinine Ratio: 14 (ref 10–24)
BUN: 21 mg/dL (ref 10–36)
CO2: 24 mmol/L (ref 20–29)
Calcium: 9.8 mg/dL (ref 8.6–10.2)
Chloride: 105 mmol/L (ref 96–106)
Creatinine, Ser: 1.49 mg/dL — ABNORMAL HIGH (ref 0.76–1.27)
Glucose: 98 mg/dL (ref 65–99)
Phosphorus: 3.5 mg/dL (ref 2.8–4.1)
Potassium: 5.1 mmol/L (ref 3.5–5.2)
Sodium: 142 mmol/L (ref 134–144)
eGFR: 43 mL/min/{1.73_m2} — ABNORMAL LOW (ref >59)

## 2020-12-14 LAB — CBC
Hematocrit: 45.3 % (ref 37.5–51.0)
Hemoglobin: 15 g/dL (ref 13.0–17.7)
MCH: 31.1 pg (ref 26.6–33.0)
MCHC: 33.1 g/dL (ref 31.5–35.7)
MCV: 94 fL (ref 79–97)
Platelets: 241 10*3/uL (ref 150–450)
RBC: 4.82 x10E6/uL (ref 4.14–5.80)
RDW: 12.6 % (ref 11.6–15.4)
WBC: 8.1 10*3/uL (ref 3.4–10.8)

## 2020-12-14 LAB — VITAMIN B12: Vitamin B-12: >2000 pg/mL — ABNORMAL HIGH (ref 232–1245)

## 2020-12-20 DIAGNOSIS — M25551 Pain in right hip: Secondary | ICD-10-CM | POA: Diagnosis not present

## 2020-12-20 DIAGNOSIS — M25562 Pain in left knee: Secondary | ICD-10-CM | POA: Insufficient documentation

## 2020-12-20 DIAGNOSIS — M25561 Pain in right knee: Secondary | ICD-10-CM | POA: Diagnosis not present

## 2020-12-20 DIAGNOSIS — M5459 Other low back pain: Secondary | ICD-10-CM | POA: Diagnosis not present

## 2020-12-20 DIAGNOSIS — M545 Low back pain, unspecified: Secondary | ICD-10-CM | POA: Insufficient documentation

## 2020-12-21 ENCOUNTER — Encounter: Payer: Self-pay | Admitting: Family Medicine

## 2020-12-22 NOTE — Telephone Encounter (Signed)
Yes prednisone can cause heart burn. You can take prilosec OTC

## 2020-12-26 NOTE — Telephone Encounter (Signed)
Would certainly advise him to follow-up with Dr. Gladstone Lighter.  Perhaps he can contact their office and see if he can get a sooner appointment?

## 2020-12-27 DIAGNOSIS — Z23 Encounter for immunization: Secondary | ICD-10-CM | POA: Diagnosis not present

## 2021-01-02 DIAGNOSIS — M5459 Other low back pain: Secondary | ICD-10-CM | POA: Diagnosis not present

## 2021-01-11 DIAGNOSIS — M5459 Other low back pain: Secondary | ICD-10-CM | POA: Diagnosis not present

## 2021-01-17 ENCOUNTER — Other Ambulatory Visit: Payer: Self-pay | Admitting: Family Medicine

## 2021-01-19 ENCOUNTER — Other Ambulatory Visit: Payer: Self-pay | Admitting: *Deleted

## 2021-01-19 DIAGNOSIS — I700 Atherosclerosis of aorta: Secondary | ICD-10-CM

## 2021-01-19 DIAGNOSIS — I714 Abdominal aortic aneurysm, without rupture, unspecified: Secondary | ICD-10-CM

## 2021-01-20 ENCOUNTER — Encounter: Payer: Self-pay | Admitting: Family Medicine

## 2021-01-20 ENCOUNTER — Other Ambulatory Visit: Payer: Self-pay

## 2021-01-20 ENCOUNTER — Encounter: Payer: Self-pay | Admitting: Vascular Surgery

## 2021-01-20 ENCOUNTER — Ambulatory Visit (INDEPENDENT_AMBULATORY_CARE_PROVIDER_SITE_OTHER): Payer: Medicare Other | Admitting: Vascular Surgery

## 2021-01-20 ENCOUNTER — Ambulatory Visit (HOSPITAL_COMMUNITY)
Admission: RE | Admit: 2021-01-20 | Discharge: 2021-01-20 | Disposition: A | Discharge status: Home / Self Care | Payer: Medicare Other | Source: Ambulatory Visit | Attending: Vascular Surgery | Admitting: Vascular Surgery

## 2021-01-20 VITALS — BP 138/79 | HR 63 | Temp 97.3°F | Resp 18 | Ht 70.0 in | Wt 175.0 lb

## 2021-01-20 DIAGNOSIS — I700 Atherosclerosis of aorta: Secondary | ICD-10-CM | POA: Insufficient documentation

## 2021-01-20 DIAGNOSIS — M25562 Pain in left knee: Secondary | ICD-10-CM

## 2021-01-20 DIAGNOSIS — M25561 Pain in right knee: Secondary | ICD-10-CM | POA: Diagnosis not present

## 2021-01-20 NOTE — Progress Notes (Signed)
Patient ID: Charles Golden, male   DOB: 1926/03/11, 87 y.o.   MRN: [id number]  Reason for Consult: New Patient (Initial Visit) (C/O bilateral lower extremity pain)   Referred by Latanya Maudlin, MD  Subjective:     HPI:  Charles Golden is a 87 y.o. male without previous history of vascular disease.  Recently he developed pain in his bilateral legs which he says is burning in nature and starts in his thighs radiating downward on the anterior part of the leg.  He denies ever having this pain before.  He does walk but is somewhat limited from the pain.  He has been evaluated by orthopedics without any frank bony injury.  He does not remember any trauma.  He does have an ingrown toenail on the left foot which is healing well.  He is a former smoker quit in 2008 does not have other risk factors for vascular disease.  Was previously on 81 mg of aspirin was stopped due to lack of vascular disease.  He was noted to have calcifications of his arteries on recent x-rays.  Other than his ingrown toenail he has no tissue loss or ulceration.  No personal or family history of aneurysm disease.  He denies any history of stroke, TIA or amaurosis.  Past Medical History:  Diagnosis Date   Bronchitis, chronic (HCC)    COPD (chronic obstructive pulmonary disease) (HCC)    Enlarged prostate    GERD (gastroesophageal reflux disease)    Glaucoma    Glucagonoma    Hernia, incisional    at present   HOH (hard of hearing)    IBS (irritable bowel syndrome)    Sciatic pain    Sinus congestion    Family History  Problem Relation Age of Onset   Heart disease Mother        Valve replacement and pacemaker   Colon cancer Father    Asthma Other        cousin   Healthy Daughter    Healthy Daughter    Healthy Daughter    Past Surgical History:  Procedure Laterality Date   BOWEL RESECTION N/A 09/04/2012   Procedure: SMALL BOWEL RESECTION;  Surgeon: Donato Heinz, MD;  Location: [facility];  Service: General;   Laterality: N/A;  Anastimosis   HEMORRHOID SURGERY     HERNIA REPAIR Bilateral 70's   INGUINAL HERNIA REPAIR Left 09/14/2013   Procedure: LEFT INGUINAL HERNIORRHAPHY;  Surgeon: Jamesetta So, MD;  Location: [facility];  Service: General;  Laterality: Left;   INGUINAL HERNIA REPAIR Right 12/23/2018   Procedure: RECURRENT RIGHT INGUINAL HERNIA  REPAIR  WITH MESH;  Surgeon: Aviva Signs, MD;  Location: [facility];  Service: General;  Laterality: Right;   INSERTION OF MESH Left 09/14/2013   Procedure: INSERTION OF MESH;  Surgeon: Jamesetta So, MD;  Location: [facility];  Service: General;  Laterality: Left;   KNEE ARTHROSCOPY Left 07/21/2014   Procedure: LEFT KNEE ARTHROSCOPY WITH MEDIAL MENISCAL DEBRIDEMENT ;  Surgeon: Gearlean Alf, MD;  Location: [facility];  Service: Orthopedics;  Laterality: Left;   LAPAROTOMY N/A 09/04/2012   Procedure: EXPLORATORY LAPAROTOMY;  Surgeon: Donato Heinz, MD;  Location: [facility];  Service: General;  Laterality: N/A;   SKIN LESION EXCISION     [name]   TONSILLECTOMY      Short Social History:  Social History   Tobacco Use   Smoking status: Former    Packs/day: 1.00  Years: 30.00    Pack years: 30.00    Types: Cigarettes    Quit date: 05/28/2006    Years since quitting: 14.6   Smokeless tobacco: Never  Substance Use Topics   Alcohol use: Not Currently    Alcohol/week: 0.0 standard drinks    Allergies  Allergen Reactions   Demerol [Meperidine] Other (See Comments)    Pt. States "he woke up during a colonoscopy"   Spiriva Handihaler [Tiotropium Bromide Monohydrate] Other (See Comments)    Mild Urinary Retention    Current Outpatient Medications  Medication Sig Dispense Refill   azelastine (ASTELIN) 0.1 % nasal spray PLACE 1 SPRAY IN EACH NOSTRIL ONCE A DAY AS DIRECTED 30 mL 4   BREO ELLIPTA 200-25 MCG/INH AEPB Inhale 1 puff into the lungs daily. 60 each 4   Cholecalciferol (VITAMIN D3) 2000 UNITS TABS Take 1 tablet by mouth daily.     Cyanocobalamin  (VITAMIN B-12) 5000 MCG SUBL Place 1 tablet under the tongue daily.     finasteride (PROSCAR) 5 MG tablet Take 1 tablet (5 mg total) by mouth daily. 90 tablet 3   fluticasone (FLONASE) 50 MCG/ACT nasal spray 1 SPRAY IN EACH NOSTRIL ONCE A DAY 16 g 5   guaiFENesin (MUCINEX) 600 MG 12 hr tablet Take 600 mg by mouth as needed. Reported on 05/25/2015     latanoprost (XALATAN) 0.005 % ophthalmic solution Place 1 drop into the left eye at bedtime. 2.5 mL 1   tamsulosin (FLOMAX) 0.4 MG CAPS capsule Take 0.8 mg by mouth daily.     timolol (TIMOPTIC) 0.5 % ophthalmic solution Place 1 drop into the left eye every morning.     VENTOLIN HFA 108 (90 Base) MCG/ACT inhaler INHALE 2 PUFFS EVERY 6 HOURS AS NEEDED FOR WHEEZING OR SHORTNESS OF BREATH 18 g 0   albuterol (PROVENTIL) (2.5 MG/3ML) 0.083% nebulizer solution Take 3 mLs (2.5 mg total) by nebulization every 6 (six) hours as needed for wheezing or shortness of breath. (Patient not taking: No sig reported) 360 mL 1   Fluticasone-Umeclidin-Vilant (TRELEGY ELLIPTA) 200-62.5-25 MCG/INH AEPB Inhale 1 puff into the lungs daily. (Patient not taking: Reported on 01/20/2021) 28 each 0   No current facility-administered medications for this visit.    Review of Systems  Constitutional:  Constitutional negative. HENT: HENT negative.  Eyes: Eyes negative.  Respiratory: Respiratory negative.  GI: Gastrointestinal negative.  Musculoskeletal: Positive for leg pain.  Skin:       Left great toe ingrown toenail Neurological: Neurological negative. Hematologic: Hematologic/lymphatic negative.  Psychiatric: Psychiatric negative.       Objective:  Objective   Vitals:   01/20/21 1143  BP: 138/79  Pulse: 63  Resp: 18  Temp: (!) 97.3 F (36.3 C)  TempSrc: Temporal  SpO2: 95%  Weight: 175 lb (79.4 kg)  Height: '5\' 10"'$  (1.778 m)   Body mass index is 25.11 kg/m.  Physical Exam HENT:     Head: Normocephalic.     Nose:     Comments: Wearing a mask Eyes:      Pupils: Pupils are equal, round, and reactive to light.  Cardiovascular:     Pulses:          Femoral pulses are 2+ on the right side and 2+ on the left side.      Popliteal pulses are 2+ on the right side and 2+ on the left side.       Dorsalis pedis pulses are 0 on the right side and  0 on the left side.       Posterior tibial pulses are 0 on the right side and 2+ on the left side.  Pulmonary:     Effort: Pulmonary effort is normal.  Abdominal:     General: Abdomen is flat.     Palpations: Abdomen is soft.  Musculoskeletal:        General: Normal range of motion.  Skin:    General: Skin is warm.     Capillary Refill: Capillary refill takes less than 2 seconds.  Neurological:     General: No focal deficit present.     Mental Status: He is alert.  Psychiatric:        Mood and Affect: Mood normal.    Data: ABI Findings:  +---------+------------------+-----+----------+--------+  Right    Rt Pressure (mmHg)IndexWaveform  Comment   +---------+------------------+-----+----------+--------+  Brachial 142                                        +---------+------------------+-----+----------+--------+  PTA      140               0.99 monophasic          +---------+------------------+-----+----------+--------+  DP       154               1.08 biphasic            +---------+------------------+-----+----------+--------+  Great Toe97                0.68 Abnormal            +---------+------------------+-----+----------+--------+   +---------+------------------+-----+---------+-------+  Left     Lt Pressure (mmHg)IndexWaveform Comment  +---------+------------------+-----+---------+-------+  Brachial 141                                      +---------+------------------+-----+---------+-------+  PTA      133               0.94 triphasic         +---------+------------------+-----+---------+-------+  DP       125               0.88  biphasic          +---------+------------------+-----+---------+-------+  Great Toe                                Bandage  +---------+------------------+-----+---------+-------+   +-------+-----------+-----------+------------+------------+  ABI/TBIToday's ABIToday's TBIPrevious ABIPrevious TBI  +-------+-----------+-----------+------------+------------+  Right  1.08       0.68                                 +-------+-----------+-----------+------------+------------+  Left   0.94       N/A                                  +-------+-----------+-----------+------------+------------+      Assessment/Plan:     87 year old male with bilateral lower extremity leg pain.  Given that he has palpable popliteal pulses bilaterally with pain starting in his bilateral thighs radiating downward does not appear to be related.  I have  recommended beginning aspirin 81 mg daily due to known vascular disease with risk factor of long history of smoking.  We will get him back in 1 year with ABIs.  He is to follow-up with his orthopedic surgeon regarding possible degenerative disc disease as an underlying cause.     Waynetta Sandy MD [facility]

## 2021-01-23 ENCOUNTER — Encounter (HOSPITAL_COMMUNITY): Payer: Medicare Other

## 2021-01-24 ENCOUNTER — Encounter: Payer: Medicare Other | Admitting: Vascular Surgery

## 2021-01-25 ENCOUNTER — Encounter: Payer: Self-pay | Admitting: Family Medicine

## 2021-01-25 ENCOUNTER — Ambulatory Visit (INDEPENDENT_AMBULATORY_CARE_PROVIDER_SITE_OTHER): Payer: Medicare Other | Admitting: Family Medicine

## 2021-01-25 ENCOUNTER — Other Ambulatory Visit: Payer: Self-pay

## 2021-01-25 VITALS — BP 126/88 | HR 101 | Temp 97.9°F | Ht 70.0 in | Wt 175.8 lb

## 2021-01-25 DIAGNOSIS — L03039 Cellulitis of unspecified toe: Secondary | ICD-10-CM | POA: Diagnosis not present

## 2021-01-25 DIAGNOSIS — M48061 Spinal stenosis, lumbar region without neurogenic claudication: Secondary | ICD-10-CM | POA: Diagnosis not present

## 2021-01-25 MED ORDER — HIBICLENS 4 % EX LIQD: Freq: Two times a day (BID) | CUTANEOUS | 0 refills | Status: DC

## 2021-01-25 MED ORDER — DOXYCYCLINE HYCLATE 100 MG PO TABS: 100.0000 mg | ORAL_TABLET | Freq: Two times a day (BID) | ORAL | 0 refills | Status: AC

## 2021-01-25 NOTE — Telephone Encounter (Signed)
Put the Hibiclens into warm water and soak for 5-10 minutes twice daily.

## 2021-01-25 NOTE — Progress Notes (Signed)
Subjective:  Patient ID: Charles Golden, male    DOB: 1925/07/13, 87 y.o.   MRN: [id number]  Patient Care Team: [name], DO as PCP - General (Family Medicine) Irine Seal, MD (Urology) Richmond Campbell, MD (Gastroenterology) Clent Jacks, MD (Ophthalmology) Gaynelle Arabian, MD as Consulting Physician (Orthopedic Surgery) Juanito Doom, MD as Consulting Physician (Pulmonary Disease) Kathrynn Ducking, MD as Consulting Physician (Neurology) [name], MD as Consulting Physician (Pulmonary Disease)   Chief Complaint:  Toe Pain (Left great toe infection /)   HPI: Charles Golden is a 87 y.o. male presenting on 01/25/2021 for Toe Pain (Left great toe infection /)   Pt presents today with left great toe pain, swelling, and erythema. States started out as an ingrown toenail. He tried to cut the toenail and toe became inflamed. He started soaking toe and applying compresses without relief of symptoms. Now has swelling, pain and erythema to entire great toe.   Toe Pain  The incident occurred more than 1 week ago. There was no injury mechanism. The pain is present in the left toes (left great toe). The pain is at a severity of 2/10. The pain is mild. The pain has been Fluctuating since onset. Pertinent negatives include no inability to bear weight, loss of motion, loss of sensation, muscle weakness, numbness or tingling. He reports no foreign bodies present. Treatments tried: soaks, cutting toenail, cool compresses, warm compresses. The treatment provided no relief.     Relevant past medical, surgical, family, and social history reviewed and updated as indicated.  Allergies and medications reviewed and updated. Data reviewed: Chart in Epic.   Past Medical History:  Diagnosis Date   Bronchitis, chronic (HCC)    COPD (chronic obstructive pulmonary disease) (HCC)    Enlarged prostate    GERD (gastroesophageal reflux disease)    Glaucoma    Glucagonoma    Hernia,  incisional    at present   [facility] (hard of hearing)    IBS (irritable bowel syndrome)    Sciatic pain    Sinus congestion       Outpatient Encounter Medications as of 01/25/2021  Medication Sig   chlorhexidine (HIBICLENS) 4 % external liquid Apply topically 2 (two) times daily.   doxycycline (VIBRA-TABS) 100 MG tablet Take 1 tablet (100 mg total) by mouth 2 (two) times daily for 10 days. 1 po bid   albuterol (PROVENTIL) (2.5 MG/3ML) 0.083% nebulizer solution Take 3 mLs (2.5 mg total) by nebulization every 6 (six) hours as needed for wheezing or shortness of breath. (Patient not taking: No sig reported)   azelastine (ASTELIN) 0.1 % nasal spray PLACE 1 SPRAY IN EACH NOSTRIL ONCE A DAY AS DIRECTED   BREO ELLIPTA 200-25 MCG/INH AEPB Inhale 1 puff into the lungs daily.   Cholecalciferol (VITAMIN D3) 2000 UNITS TABS Take 1 tablet by mouth daily.   Cyanocobalamin (VITAMIN B-12) 5000 MCG SUBL Place 1 tablet under the tongue daily.   finasteride (PROSCAR) 5 MG tablet Take 1 tablet (5 mg total) by mouth daily.   fluticasone (FLONASE) 50 MCG/ACT nasal spray 1 SPRAY IN EACH NOSTRIL ONCE A DAY   Fluticasone-Umeclidin-Vilant (TRELEGY ELLIPTA) 200-62.5-25 MCG/INH AEPB Inhale 1 puff into the lungs daily. (Patient not taking: Reported on 01/20/2021)   gabapentin (NEURONTIN) 300 MG capsule Take by mouth.   guaiFENesin (MUCINEX) 600 MG 12 hr tablet Take 600 mg by mouth as needed. Reported on 05/25/2015   latanoprost (XALATAN) 0.005 % ophthalmic solution Place  1 drop into the left eye at bedtime.   tamsulosin (FLOMAX) 0.4 MG CAPS capsule Take 0.8 mg by mouth daily.   timolol (TIMOPTIC) 0.5 % ophthalmic solution Place 1 drop into the left eye every morning.   VENTOLIN HFA 108 (90 Base) MCG/ACT inhaler INHALE 2 PUFFS EVERY 6 HOURS AS NEEDED FOR WHEEZING OR SHORTNESS OF BREATH   No facility-administered encounter medications on file as of 01/25/2021.    Allergies  Allergen Reactions   Demerol [Meperidine] Other  (See Comments)    Pt. States "he woke up during a colonoscopy"   Spiriva Handihaler [Tiotropium Bromide Monohydrate] Other (See Comments)    Mild Urinary Retention    Review of Systems  Constitutional:  Negative for activity change, appetite change, chills, diaphoresis, fatigue, fever and unexpected weight change.  HENT:  Positive for hearing loss.   Eyes: Negative.   Respiratory:  Negative for cough, chest tightness and shortness of breath.   Cardiovascular:  Negative for chest pain, palpitations and leg swelling.  Gastrointestinal:  Negative for abdominal pain, blood in stool, constipation, diarrhea, nausea and vomiting.  Endocrine: Negative.   Genitourinary:  Negative for decreased urine volume, difficulty urinating, dysuria, frequency and urgency.  Musculoskeletal:  Positive for arthralgias and myalgias.  Skin:  Positive for color change and wound. Negative for pallor and rash.  Allergic/Immunologic: Negative.   Neurological:  Negative for dizziness, tingling, tremors, seizures, syncope, facial asymmetry, speech difficulty, weakness, light-headedness, numbness and headaches.  Hematological: Negative.   Psychiatric/Behavioral:  Negative for confusion, hallucinations, sleep disturbance and suicidal ideas.   All other systems reviewed and are negative.      Objective:  BP 126/88   Pulse (!) 101   Temp 97.9 F (36.6 C) (Temporal)   Ht 5' 10"  (1.778 m)   Wt 175 lb 12.8 oz (79.7 kg)   SpO2 97%   BMI 25.22 kg/m    Wt Readings from Last 3 Encounters:  01/25/21 175 lb 12.8 oz (79.7 kg)  01/20/21 175 lb (79.4 kg)  12/13/20 175 lb 9.6 oz (79.7 kg)    Physical Exam Vitals and nursing note reviewed.  Constitutional:      General: He is not in acute distress.    Appearance: Normal appearance. He is well-developed, well-groomed and normal weight. He is not ill-appearing, toxic-appearing or diaphoretic.  HENT:     Head: Normocephalic and atraumatic.     Jaw: There is normal jaw  occlusion.     Right Ear: Hearing normal.     Left Ear: Hearing normal.     Nose: Nose normal.     Mouth/Throat:     Lips: Pink.     Mouth: Mucous membranes are moist.     Pharynx: Oropharynx is clear. Uvula midline.  Eyes:     General: Lids are normal.     Extraocular Movements: Extraocular movements intact.     Conjunctiva/sclera: Conjunctivae normal.     Pupils: Pupils are equal, round, and reactive to light.  Neck:     Thyroid: No thyroid mass, thyromegaly or thyroid tenderness.     Vascular: No carotid bruit or JVD.     Trachea: Trachea and phonation normal.  Cardiovascular:     Rate and Rhythm: Normal rate and regular rhythm.     Chest Wall: PMI is not displaced.     Pulses: Normal pulses.     Heart sounds: Normal heart sounds. No murmur heard.   No friction rub. No gallop.  Pulmonary:  Effort: Pulmonary effort is normal. No respiratory distress.     Breath sounds: Normal breath sounds. No wheezing.  Abdominal:     General: Bowel sounds are normal. There is no distension or abdominal bruit.     Palpations: Abdomen is soft. There is no hepatomegaly or splenomegaly.     Tenderness: There is no abdominal tenderness. There is no right CVA tenderness or left CVA tenderness.     Hernia: No hernia is present.  Musculoskeletal:        General: Normal range of motion.     Cervical back: Normal range of motion and neck supple.     Right lower leg: No edema.     Left lower leg: No edema.  Lymphadenopathy:     Cervical: No cervical adenopathy.  Skin:    General: Skin is warm and dry.     Capillary Refill: Capillary refill takes less than 2 seconds.     Coloration: Skin is not cyanotic, jaundiced or pale.     Findings: Erythema and wound present. No abrasion, abscess, acne, bruising, burn, ecchymosis, signs of injury, laceration, lesion, petechiae or rash.       Neurological:     General: No focal deficit present.     Mental Status: He is alert and oriented to person,  place, and time.     Sensory: Sensation is intact.     Motor: Motor function is intact.     Coordination: Coordination is intact.     Gait: Gait is intact.     Deep Tendon Reflexes: Reflexes are normal and symmetric.  Psychiatric:        Attention and Perception: Attention and perception normal.        Mood and Affect: Mood and affect normal.        Speech: Speech normal.        Behavior: Behavior normal. Behavior is cooperative.        Thought Content: Thought content normal.        Cognition and Memory: Cognition and memory normal.        Judgment: Judgment normal.    Results for orders placed or performed in visit on 12/13/20  Renal Function Panel  Result Value Ref Range   Glucose 98 65 - 99 mg/dL   BUN 21 10 - 36 mg/dL   Creatinine, Ser 1.49 (H) 0.76 - 1.27 mg/dL   eGFR 43 (L) >59 mL/min/1.73   BUN/Creatinine Ratio 14 10 - 24   Sodium 142 134 - 144 mmol/L   Potassium 5.1 3.5 - 5.2 mmol/L   Chloride 105 96 - 106 mmol/L   CO2 24 20 - 29 mmol/L   Calcium 9.8 8.6 - 10.2 mg/dL   Phosphorus 3.5 2.8 - 4.1 mg/dL   Albumin 4.1 3.5 - 4.6 g/dL  Vitamin B12  Result Value Ref Range   Vitamin B-12 >2000 (H) 232 - 1245 pg/mL  CBC  Result Value Ref Range   WBC 8.1 3.4 - 10.8 x10E3/uL   RBC 4.82 4.14 - 5.80 x10E6/uL   Hemoglobin 15.0 13.0 - 17.7 g/dL   Hematocrit 45.3 37.5 - 51.0 %   MCV 94 79 - 97 fL   MCH 31.1 26.6 - 33.0 pg   MCHC 33.1 31.5 - 35.7 g/dL   RDW 12.6 11.6 - 15.4 %   Platelets 241 150 - 450 x10E3/uL       Pertinent labs & imaging results that were available during my care of the patient were reviewed  by me and considered in my medical decision making.  Assessment & Plan:  Jobin was seen today for toe pain.  Diagnoses and all orders for this visit:  Paronychia of great toe Symptoms have been present for over 10 days, now with swelling and erythema to entire toe. Will treat with oral antibiotics along with Hibiclens soaks twice daily for 10 days. Pt aware to  report any new, worsening, or persistent symptoms.  -     chlorhexidine (HIBICLENS) 4 % external liquid; Apply topically 2 (two) times daily. -     doxycycline (VIBRA-TABS) 100 MG tablet; Take 1 tablet (100 mg total) by mouth 2 (two) times daily for 10 days. 1 po bid     Continue all other maintenance medications.  Follow up plan: Return in about 2 weeks (around 02/08/2021), or if symptoms worsen or fail to improve.   Continue healthy lifestyle choices, including diet (rich in fruits, vegetables, and lean proteins, and low in salt and simple carbohydrates) and exercise (at least 30 minutes of moderate physical activity daily).  Educational handout given for paronychia  The above assessment and management plan was discussed with the patient. The patient verbalized understanding of and has agreed to the management plan. Patient is aware to call the clinic if they develop any new symptoms or if symptoms persist or worsen. Patient is aware when to return to the clinic for a follow-up visit. Patient educated on when it is appropriate to go to the emergency department.   [name], FNP-C [facility] [phone]  Paronychia

## 2021-01-25 NOTE — Patient Instructions (Signed)
Chlorhexidine soaks twice daily. Antibiotics as prescribed.

## 2021-02-01 ENCOUNTER — Encounter: Payer: Self-pay | Admitting: Pulmonary Disease

## 2021-02-01 ENCOUNTER — Other Ambulatory Visit: Payer: Self-pay

## 2021-02-01 ENCOUNTER — Ambulatory Visit (INDEPENDENT_AMBULATORY_CARE_PROVIDER_SITE_OTHER): Payer: Medicare Other

## 2021-02-01 ENCOUNTER — Ambulatory Visit (INDEPENDENT_AMBULATORY_CARE_PROVIDER_SITE_OTHER): Payer: Medicare Other | Admitting: Pulmonary Disease

## 2021-02-01 VITALS — BP 130/78 | HR 86 | Temp 97.8°F | Ht 70.0 in | Wt 173.0 lb

## 2021-02-01 DIAGNOSIS — J479 Bronchiectasis, uncomplicated: Secondary | ICD-10-CM | POA: Diagnosis not present

## 2021-02-01 DIAGNOSIS — R059 Cough, unspecified: Secondary | ICD-10-CM

## 2021-02-01 DIAGNOSIS — J439 Emphysema, unspecified: Secondary | ICD-10-CM | POA: Diagnosis not present

## 2021-02-01 DIAGNOSIS — J449 Chronic obstructive pulmonary disease, unspecified: Secondary | ICD-10-CM

## 2021-02-01 LAB — CBC WITH DIFFERENTIAL/PLATELET
Basophils Absolute: 0 10*3/uL (ref 0.0–0.1)
Basophils Relative: 0.2 % (ref 0.0–3.0)
Eosinophils Absolute: 0.1 10*3/uL (ref 0.0–0.7)
Eosinophils Relative: 0.4 % (ref 0.0–5.0)
HCT: 47.2 % (ref 39.0–52.0)
Hemoglobin: 15.2 g/dL (ref 13.0–17.0)
Lymphocytes Relative: 11.5 % — ABNORMAL LOW (ref 12.0–46.0)
Lymphs Abs: 1.7 10*3/uL (ref 0.7–4.0)
MCHC: 32.3 g/dL (ref 30.0–36.0)
MCV: 94 fl (ref 78.0–100.0)
Monocytes Absolute: 1 10*3/uL (ref 0.1–1.0)
Monocytes Relative: 6.5 % (ref 3.0–12.0)
Neutro Abs: 12 10*3/uL — ABNORMAL HIGH (ref 1.4–7.7)
Neutrophils Relative %: 81.4 % — ABNORMAL HIGH (ref 43.0–77.0)
Platelets: 300 10*3/uL (ref 150.0–400.0)
RBC: 5.02 Mil/uL (ref 4.22–5.81)
RDW: 14.3 % (ref 11.5–15.5)
WBC: 14.8 10*3/uL — ABNORMAL HIGH (ref 4.0–10.5)

## 2021-02-01 LAB — COMPREHENSIVE METABOLIC PANEL
ALT: 12 U/L (ref 0–53)
AST: 18 U/L (ref 0–37)
Albumin: 3.9 g/dL (ref 3.5–5.2)
Alkaline Phosphatase: 88 U/L (ref 39–117)
BUN: 31 mg/dL — ABNORMAL HIGH (ref 6–23)
CO2: 25 mEq/L (ref 19–32)
Calcium: 9.6 mg/dL (ref 8.4–10.5)
Chloride: 106 mEq/L (ref 96–112)
Creatinine, Ser: 1.53 mg/dL — ABNORMAL HIGH (ref 0.40–1.50)
GFR: 38.98 mL/min — ABNORMAL LOW (ref >60.00)
Glucose, Bld: 107 mg/dL — ABNORMAL HIGH (ref 70–99)
Potassium: 5.2 mEq/L — ABNORMAL HIGH (ref 3.5–5.1)
Sodium: 139 mEq/L (ref 135–145)
Total Bilirubin: 0.5 mg/dL (ref 0.2–1.2)
Total Protein: 6.8 g/dL (ref 6.0–8.3)

## 2021-02-01 NOTE — Progress Notes (Signed)
[facility]  Chief Complaint  Patient presents with   Shortness of Breath    Constitutional:  BP 130/78   Pulse 86   Temp 97.8 F (36.6 C)   Ht '5\' 10"'$  (1.778 m)   Wt 173 lb (78.5 kg)   SpO2 99%   BMI 24.82 kg/m   Past Medical History:  Sciatica, IBS, Glaucoma, Glucagonoma, GERD, BPH  Past Surgical History:  He  has a past surgical history that includes Hemorrhoid surgery; laparotomy (N/A, 09/04/2012); Bowel resection (N/A, 09/04/2012); Inguinal hernia repair (Left, 09/14/2013); Insertion of mesh (Left, 09/14/2013); Tonsillectomy; Knee arthroscopy (Left, 07/21/2014); Skin lesion excision; Hernia repair (Bilateral, 70's); and Inguinal hernia repair (Right, 12/23/2018).  Brief Summary:  Charles Golden is a 87 y.o. male former smoker with BTX, COPD/emphysema and GERD.      Subjective:   He has more cough with yellow/green sputum.  He doesn't feel like he has energy.  He has been getting burning feeling in his legs.  He has MRI back set up.  He maintained SpO2 > 92% while walking on room air in office today.  Physical Exam:   Appearance - well kempt   ENMT - no sinus tenderness, no oral exudate, no LAN, Mallampati 3 airway, no stridor  Respiratory - scattered rhonchi  CV - s1s2 regular rate and rhythm, no murmurs  Ext - no clubbing, no edema  Skin - no rashes  Psych - normal mood and affect   Pulmonary testing:  A1AT 04/11/15 >> 153, MM PFT 07/13/15 >> FEV1 2.40 (92%), FEV1% 66, TLC 8.07 (112%), DLCO 75%, +BD  Chest Imaging:  CT chest 04/19/15 >> mild cylindrical BTX in bases, mild centrilobular emphysema, calcified mediastinal LN  Sleep Tests:  PSG 11/15/16 >> AHI 3.6  Social History:  He  reports that he quit smoking about 14 years ago. His smoking use included cigarettes. He has a 30.00 pack-year smoking history. He has never used smokeless tobacco. He reports that he does not currently use alcohol. He reports that he does  not use drugs.  Family History:  His family history includes Asthma in an other family member; Colon cancer in his father; Healthy in his daughter, daughter, and daughter; Heart disease in his mother.     Assessment/Plan:   Bronchiectasis. - he has progression of his symptoms - will arrange for chest xray, labs, sputum culture and sputum AFB - continue prn albuterol, mucinex, flutter valve - he is on doxycycline currently for foot infection   COPD with chronic bronchitis and emphysema. - continue breo - prn albuterol   Allergic rhinitis. - prn azelastine, flonase  Paresthesia in lower extremities. - advised he might need neurology assessment if MRI is unrevealing - he has follow up with Dr. Gladstone Lighter with orthopedics after his MRI  Time Spent Involved in Patient Care on Day of Examination:  36 minutes  Follow up:   Patient Instructions  Chest xray and lab tests today  Will arrange for sputum culture and sputum AFB culture  Follow up in 4 to 6 weeks  Medication List:   Allergies as of 02/01/2021       Reactions   Demerol [meperidine] Other (See Comments)   Pt. States "he woke up during a colonoscopy"   Spiriva Handihaler [tiotropium Bromide Monohydrate] Other (See Comments)   Mild Urinary Retention        Medication List        Accurate as of February 01, 2021 12:43 PM. If you have any questions, ask your nurse or doctor.          albuterol (2.5 MG/3ML) 0.083% nebulizer solution Commonly known as: PROVENTIL Take 3 mLs (2.5 mg total) by nebulization every 6 (six) hours as needed for wheezing or shortness of breath.   Ventolin HFA 108 (90 Base) MCG/ACT inhaler Generic drug: albuterol INHALE 2 PUFFS EVERY 6 HOURS AS NEEDED FOR WHEEZING OR SHORTNESS OF BREATH   azelastine 0.1 % nasal spray Commonly known as: ASTELIN PLACE 1 SPRAY IN EACH NOSTRIL ONCE A DAY AS DIRECTED   Breo Ellipta 200-25 MCG/INH Aepb Generic drug: fluticasone  furoate-vilanterol Inhale 1 puff into the lungs daily.   doxycycline 100 MG tablet Commonly known as: VIBRA-TABS Take 1 tablet (100 mg total) by mouth 2 (two) times daily for 10 days. 1 po bid   finasteride 5 MG tablet Commonly known as: PROSCAR Take 1 tablet (5 mg total) by mouth daily.   fluticasone 50 MCG/ACT nasal spray Commonly known as: FLONASE 1 SPRAY IN EACH NOSTRIL ONCE A DAY   gabapentin 300 MG capsule Commonly known as: NEURONTIN Take by mouth.   guaiFENesin 600 MG 12 hr tablet Commonly known as: MUCINEX Take 600 mg by mouth as needed. Reported on 05/25/2015   Hibiclens 4 % external liquid Generic drug: chlorhexidine Apply topically 2 (two) times daily.   latanoprost 0.005 % ophthalmic solution Commonly known as: XALATAN Place 1 drop into the left eye at bedtime.   tamsulosin 0.4 MG Caps capsule Commonly known as: FLOMAX Take 0.8 mg by mouth daily.   timolol 0.5 % ophthalmic solution Commonly known as: TIMOPTIC Place 1 drop into the left eye every morning.   Trelegy Ellipta 200-62.5-25 MCG/INH Aepb Generic drug: Fluticasone-Umeclidin-Vilant Inhale 1 puff into the lungs daily.   Vitamin B-12 5000 MCG Subl Place 1 tablet under the tongue daily.   Vitamin D3 50 MCG (2000 UT) Tabs Take 1 tablet by mouth daily.        Signature:  [name], MD [place] Pager - [phone] 02/01/2021, 12:43 PM

## 2021-02-01 NOTE — Patient Instructions (Signed)
Chest xray and lab tests today  Will arrange for sputum culture and sputum AFB culture  Follow up in 4 to 6 weeks

## 2021-02-08 ENCOUNTER — Other Ambulatory Visit: Payer: Self-pay | Admitting: Pulmonary Disease

## 2021-02-08 ENCOUNTER — Other Ambulatory Visit: Payer: Medicare Other

## 2021-02-08 DIAGNOSIS — M5459 Other low back pain: Secondary | ICD-10-CM | POA: Diagnosis not present

## 2021-02-08 DIAGNOSIS — R059 Cough, unspecified: Secondary | ICD-10-CM

## 2021-02-13 DIAGNOSIS — M5459 Other low back pain: Secondary | ICD-10-CM | POA: Diagnosis not present

## 2021-02-21 ENCOUNTER — Encounter: Payer: Self-pay | Admitting: Family Medicine

## 2021-02-27 ENCOUNTER — Encounter: Payer: Self-pay | Admitting: Family Medicine

## 2021-02-28 ENCOUNTER — Other Ambulatory Visit: Payer: Self-pay | Admitting: Family Medicine

## 2021-02-28 DIAGNOSIS — J449 Chronic obstructive pulmonary disease, unspecified: Secondary | ICD-10-CM

## 2021-02-28 LAB — SPECIMEN STATUS REPORT

## 2021-03-06 ENCOUNTER — Encounter: Payer: Self-pay | Admitting: Pulmonary Disease

## 2021-03-06 ENCOUNTER — Other Ambulatory Visit: Payer: Self-pay

## 2021-03-06 ENCOUNTER — Ambulatory Visit (INDEPENDENT_AMBULATORY_CARE_PROVIDER_SITE_OTHER): Payer: Medicare Other | Admitting: Pulmonary Disease

## 2021-03-06 VITALS — BP 130/70 | HR 71 | Temp 98.1°F | Ht 70.5 in | Wt 177.2 lb

## 2021-03-06 DIAGNOSIS — J479 Bronchiectasis, uncomplicated: Secondary | ICD-10-CM

## 2021-03-06 DIAGNOSIS — R0602 Shortness of breath: Secondary | ICD-10-CM

## 2021-03-06 DIAGNOSIS — J449 Chronic obstructive pulmonary disease, unspecified: Secondary | ICD-10-CM

## 2021-03-06 LAB — CBC
HCT: 42.8 % (ref 39.0–52.0)
Hemoglobin: 14 g/dL (ref 13.0–17.0)
MCHC: 32.8 g/dL (ref 30.0–36.0)
MCV: 93.5 fl (ref 78.0–100.0)
Platelets: 244 10*3/uL (ref 150.0–400.0)
RBC: 4.58 Mil/uL (ref 4.22–5.81)
RDW: 14.1 % (ref 11.5–15.5)
WBC: 7.4 10*3/uL (ref 4.0–10.5)

## 2021-03-06 NOTE — Progress Notes (Signed)
[facility]  Chief Complaint  Patient presents with   Follow-up    Constitutional:  BP 130/70 (BP Location: Left Arm, Patient Position: Sitting, Cuff Size: Normal)   Pulse 71   Temp 98.1 F (36.7 C) (Oral)   Ht 5' 10.5" (1.791 m)   Wt 177 lb 3.2 oz (80.4 kg)   BMI 25.07 kg/m   Past Medical History:  Sciatica, IBS, Glaucoma, Glucagonoma, GERD, BPH  Past Surgical History:  He  has a past surgical history that includes Hemorrhoid surgery; laparotomy (N/A, 09/04/2012); Bowel resection (N/A, 09/04/2012); Inguinal hernia repair (Left, 09/14/2013); Insertion of mesh (Left, 09/14/2013); Tonsillectomy; Knee arthroscopy (Left, 07/21/2014); Skin lesion excision; Hernia repair (Bilateral, 70's); and Inguinal hernia repair (Right, 12/23/2018).  Brief Summary:  Charles Golden is a 87 y.o. male former smoker with BTX, COPD/emphysema and GERD.      Subjective:   Chest xray from 02/02/21 showed changes of emphysema.  He tried taking his sputum sample to the lab few weeks ago.  There wasn't anyone at the lab, so he left his cup at the desk.  He is still feeling weak.  He gets burning feeling in his legs.  He has cough with thick white to yellow sputum.  Gets winded and has to rest if he walks too much.  Not having fever, or chest pain.  No hemoptysis.  Physical Exam:   Appearance - well kempt   ENMT - no sinus tenderness, no oral exudate, no LAN, Mallampati 3 airway, no stridor  Respiratory - scattered rhonchi that clear with coughing, no wheeze  CV - s1s2 regular rate and rhythm, no murmurs  Ext - no clubbing, no edema  Skin - no rashes  Psych - normal mood and affect    Pulmonary testing:  A1AT 04/11/15 >> 153, MM PFT 07/13/15 >> FEV1 2.40 (92%), FEV1% 66, TLC 8.07 (112%), DLCO 75%, +BD  Chest Imaging:  CT chest 04/19/15 >> mild cylindrical BTX in bases, mild centrilobular emphysema, calcified mediastinal LN  Sleep Tests:  PSG 11/15/16  >> AHI 3.6  Social History:  He  reports that he quit smoking about 14 years ago. His smoking use included cigarettes. He has a 30.00 pack-year smoking history. He has never used smokeless tobacco. He reports that he does not currently use alcohol. He reports that he does not use drugs.  Family History:  His family history includes Asthma in an other family member; Colon cancer in his father; Healthy in his daughter, daughter, and daughter; Heart disease in his mother.     Assessment/Plan:   Bronchiectasis. - will have him try to get sample of sputum again for culture and AFB - will arrange high resolution CT chest - continue bronchial hygiene with albuterol, mucinex, and flutter valve - defer additional antibiotics until above tests are available   COPD with chronic bronchitis and emphysema. - continue breo with prn albuterol   Allergic rhinitis. - prn azelastine, flonase  Paresthesia in lower extremities. - he has appointment with neurology set up  Time Spent Involved in Patient Care on Day of Examination:  34 minutes  Follow up:   Patient Instructions  Will arrange for sampling of your sputum  Lab test today  Will arrange for high resolution CT chest   Follow up in 2 months  Medication List:   Allergies as of 03/06/2021       Reactions   Demerol [meperidine] Other (See Comments)   Pt. States "  he woke up during a colonoscopy"   Spiriva Handihaler [tiotropium Bromide Monohydrate] Other (See Comments)   Mild Urinary Retention        Medication List        Accurate as of March 06, 2021 12:44 PM. If you have any questions, ask your nurse or doctor.          STOP taking these medications    fluticasone 50 MCG/ACT nasal spray Commonly known as: FLONASE Stopped by: [name], MD   gabapentin 300 MG capsule Commonly known as: NEURONTIN Stopped by: [name], MD   Hibiclens 4 % external liquid Generic drug: chlorhexidine Stopped by: [name], MD   Trelegy Ellipta 200-62.5-25 MCG/INH Aepb Generic drug: Fluticasone-Umeclidin-Vilant Stopped by: [name], MD       TAKE these medications    azelastine 0.1 % nasal spray Commonly known as: ASTELIN PLACE 1 SPRAY IN EACH NOSTRIL ONCE A DAY AS DIRECTED   Breo Ellipta 200-25 MCG/INH Aepb Generic drug: fluticasone furoate-vilanterol Inhale 1 puff into the lungs daily.   finasteride 5 MG tablet Commonly known as: PROSCAR Take 1 tablet (5 mg total) by mouth daily.   guaiFENesin 600 MG 12 hr tablet Commonly known as: MUCINEX Take 600 mg by mouth as needed. Reported on 05/25/2015   latanoprost 0.005 % ophthalmic solution Commonly known as: XALATAN Place 1 drop into the left eye at bedtime.   tamsulosin 0.4 MG Caps capsule Commonly known as: FLOMAX Take 0.8 mg by mouth daily.   timolol 0.5 % ophthalmic solution Commonly known as: TIMOPTIC Place 1 drop into the left eye every morning.   Ventolin HFA 108 (90 Base) MCG/ACT inhaler Generic drug: albuterol INHALE 2 PUFFS EVERY 6 HOURS AS NEEDED FOR WHEEZING OR SHORTNESS OF BREATH What changed: Another medication with the same name was removed. Continue taking this medication, and follow the directions you see here. Changed by: [name], MD   Vitamin B-12 5000 MCG Subl Place 1 tablet under the tongue daily.   Vitamin D3 50 MCG (2000 UT) Tabs Take 1 tablet by mouth daily.        Signature:  [name], MD [place] Pager - [phone] 03/06/2021, 12:44 PM

## 2021-03-06 NOTE — Patient Instructions (Signed)
Will arrange for sampling of your sputum  Lab test today  Will arrange for high resolution CT chest   Follow up in 2 months

## 2021-03-07 ENCOUNTER — Other Ambulatory Visit: Payer: Medicare Other

## 2021-03-07 ENCOUNTER — Ambulatory Visit (INDEPENDENT_AMBULATORY_CARE_PROVIDER_SITE_OTHER): Payer: Medicare Other

## 2021-03-07 DIAGNOSIS — J479 Bronchiectasis, uncomplicated: Secondary | ICD-10-CM

## 2021-03-07 DIAGNOSIS — Z23 Encounter for immunization: Secondary | ICD-10-CM

## 2021-03-13 ENCOUNTER — Ambulatory Visit (HOSPITAL_BASED_OUTPATIENT_CLINIC_OR_DEPARTMENT_OTHER)
Admission: RE | Admit: 2021-03-13 | Discharge: 2021-03-13 | Disposition: A | Discharge status: Home / Self Care | Payer: Medicare Other | Source: Ambulatory Visit | Attending: Pulmonary Disease | Admitting: Pulmonary Disease

## 2021-03-13 ENCOUNTER — Telehealth: Payer: Self-pay | Admitting: Pulmonary Disease

## 2021-03-13 ENCOUNTER — Other Ambulatory Visit: Payer: Self-pay

## 2021-03-13 DIAGNOSIS — J479 Bronchiectasis, uncomplicated: Secondary | ICD-10-CM | POA: Diagnosis not present

## 2021-03-13 DIAGNOSIS — I700 Atherosclerosis of aorta: Secondary | ICD-10-CM | POA: Diagnosis not present

## 2021-03-13 DIAGNOSIS — R0602 Shortness of breath: Secondary | ICD-10-CM | POA: Diagnosis not present

## 2021-03-13 NOTE — Telephone Encounter (Signed)
I have called and spoke with Charles Golden from [facility] and she stated that the order that they got did not list the smear.  I advised her that the smear would need to be done and she will change this order.  I advised her that the order that we see on our end has the smear ordered with it.  Nothing further is needed.

## 2021-03-28 ENCOUNTER — Other Ambulatory Visit: Payer: Self-pay | Admitting: Pulmonary Disease

## 2021-03-28 ENCOUNTER — Other Ambulatory Visit: Payer: Medicare Other

## 2021-03-28 DIAGNOSIS — J479 Bronchiectasis, uncomplicated: Secondary | ICD-10-CM

## 2021-03-28 LAB — AFB CULTURE WITH SMEAR (NOT AT ARMC)
Acid Fast Culture: NEGATIVE
Acid Fast Smear: NEGATIVE

## 2021-04-13 ENCOUNTER — Telehealth: Payer: Self-pay | Admitting: Pulmonary Disease

## 2021-04-13 NOTE — Telephone Encounter (Signed)
disregard

## 2021-04-24 ENCOUNTER — Ambulatory Visit: Payer: Medicare Other | Admitting: Pulmonary Disease

## 2021-04-26 ENCOUNTER — Ambulatory Visit: Payer: Medicare Other | Admitting: Neurology

## 2021-04-26 LAB — AFB CULTURE WITH SMEAR (NOT AT ARMC)
Acid Fast Culture: NEGATIVE
Acid Fast Smear: NEGATIVE

## 2021-04-27 ENCOUNTER — Observation Stay (HOSPITAL_BASED_OUTPATIENT_CLINIC_OR_DEPARTMENT_OTHER)
Admission: EM | Admit: 2021-04-27 | Discharge: 2021-04-28 | Disposition: A | Discharge status: Home / Self Care | Payer: Medicare Other | Source: Home / Self Care | Attending: Emergency Medicine | Admitting: Emergency Medicine

## 2021-04-27 ENCOUNTER — Emergency Department (HOSPITAL_COMMUNITY): Payer: Medicare Other

## 2021-04-27 DIAGNOSIS — G459 Transient cerebral ischemic attack, unspecified: Secondary | ICD-10-CM | POA: Insufficient documentation

## 2021-04-27 DIAGNOSIS — R4781 Slurred speech: Secondary | ICD-10-CM | POA: Diagnosis not present

## 2021-04-27 DIAGNOSIS — R351 Nocturia: Secondary | ICD-10-CM

## 2021-04-27 DIAGNOSIS — Z79899 Other long term (current) drug therapy: Secondary | ICD-10-CM | POA: Insufficient documentation

## 2021-04-27 DIAGNOSIS — N401 Enlarged prostate with lower urinary tract symptoms: Secondary | ICD-10-CM | POA: Diagnosis not present

## 2021-04-27 DIAGNOSIS — I639 Cerebral infarction, unspecified: Secondary | ICD-10-CM

## 2021-04-27 DIAGNOSIS — I672 Cerebral atherosclerosis: Secondary | ICD-10-CM | POA: Diagnosis not present

## 2021-04-27 DIAGNOSIS — J449 Chronic obstructive pulmonary disease, unspecified: Secondary | ICD-10-CM

## 2021-04-27 DIAGNOSIS — Z87891 Personal history of nicotine dependence: Secondary | ICD-10-CM | POA: Insufficient documentation

## 2021-04-27 DIAGNOSIS — N1832 Chronic kidney disease, stage 3b: Secondary | ICD-10-CM | POA: Insufficient documentation

## 2021-04-27 DIAGNOSIS — I6522 Occlusion and stenosis of left carotid artery: Secondary | ICD-10-CM | POA: Diagnosis not present

## 2021-04-27 DIAGNOSIS — R9431 Abnormal electrocardiogram [ECG] [EKG]: Secondary | ICD-10-CM | POA: Diagnosis not present

## 2021-04-27 DIAGNOSIS — I6602 Occlusion and stenosis of left middle cerebral artery: Secondary | ICD-10-CM | POA: Diagnosis not present

## 2021-04-27 DIAGNOSIS — N400 Enlarged prostate without lower urinary tract symptoms: Secondary | ICD-10-CM | POA: Diagnosis present

## 2021-04-27 DIAGNOSIS — R29818 Other symptoms and signs involving the nervous system: Secondary | ICD-10-CM | POA: Diagnosis not present

## 2021-04-27 HISTORY — DX: Transient cerebral ischemic attack, unspecified: G45.9

## 2021-04-27 LAB — CBC
HCT: 44.1 % (ref 39.0–52.0)
Hemoglobin: 14.4 g/dL (ref 13.0–17.0)
MCH: 31.3 pg (ref 26.0–34.0)
MCHC: 32.7 g/dL (ref 30.0–36.0)
MCV: 95.9 fL (ref 80.0–100.0)
Platelets: 280 10*3/uL (ref 150–400)
RBC: 4.6 MIL/uL (ref 4.22–5.81)
RDW: 13.2 % (ref 11.5–15.5)
WBC: 9.6 10*3/uL (ref 4.0–10.5)
nRBC: 0 % (ref 0.0–0.2)

## 2021-04-27 LAB — COMPREHENSIVE METABOLIC PANEL
ALT: 12 U/L (ref 0–44)
AST: 23 U/L (ref 15–41)
Albumin: 3.5 g/dL (ref 3.5–5.0)
Alkaline Phosphatase: 67 U/L (ref 38–126)
Anion gap: 7 (ref 5–15)
BUN: 23 mg/dL (ref 8–23)
CO2: 24 mmol/L (ref 22–32)
Calcium: 8.8 mg/dL — ABNORMAL LOW (ref 8.9–10.3)
Chloride: 107 mmol/L (ref 98–111)
Creatinine, Ser: 1.66 mg/dL — ABNORMAL HIGH (ref 0.61–1.24)
GFR, Estimated: 38 mL/min — ABNORMAL LOW (ref >60)
Glucose, Bld: 94 mg/dL (ref 70–99)
Potassium: 4.6 mmol/L (ref 3.5–5.1)
Sodium: 138 mmol/L (ref 135–145)
Total Bilirubin: 0.7 mg/dL (ref 0.3–1.2)
Total Protein: 6 g/dL — ABNORMAL LOW (ref 6.5–8.1)

## 2021-04-27 LAB — DIFFERENTIAL
Abs Immature Granulocytes: 0.03 10*3/uL (ref 0.00–0.07)
Basophils Absolute: 0 10*3/uL (ref 0.0–0.1)
Basophils Relative: 0 %
Eosinophils Absolute: 0.1 10*3/uL (ref 0.0–0.5)
Eosinophils Relative: 1 %
Immature Granulocytes: 0 %
Lymphocytes Relative: 24 %
Lymphs Abs: 2.3 10*3/uL (ref 0.7–4.0)
Monocytes Absolute: 1 10*3/uL (ref 0.1–1.0)
Monocytes Relative: 10 %
Neutro Abs: 6.1 10*3/uL (ref 1.7–7.7)
Neutrophils Relative %: 65 %

## 2021-04-27 LAB — I-STAT CHEM 8, ED
BUN: 25 mg/dL — ABNORMAL HIGH (ref 8–23)
Calcium, Ion: 1.17 mmol/L (ref 1.15–1.40)
Chloride: 105 mmol/L (ref 98–111)
Creatinine, Ser: 1.7 mg/dL — ABNORMAL HIGH (ref 0.61–1.24)
Glucose, Bld: 92 mg/dL (ref 70–99)
HCT: 44 % (ref 39.0–52.0)
Hemoglobin: 15 g/dL (ref 13.0–17.0)
Potassium: 4.6 mmol/L (ref 3.5–5.1)
Sodium: 139 mmol/L (ref 135–145)
TCO2: 24 mmol/L (ref 22–32)

## 2021-04-27 LAB — PROTIME-INR
INR: 1.1 (ref 0.8–1.2)
Prothrombin Time: 14.2 seconds (ref 11.4–15.2)

## 2021-04-27 LAB — APTT: aPTT: 28 seconds (ref 24–36)

## 2021-04-27 MED ORDER — TAMSULOSIN HCL 0.4 MG PO CAPS
0.8000 mg | ORAL_CAPSULE | Freq: Every day | ORAL | Status: DC
  Administered 2021-04-28: 0.8 mg via ORAL
  Filled 2021-04-27: qty 2

## 2021-04-27 MED ORDER — ACETAMINOPHEN 160 MG/5ML PO SOLN: 650.0000 mg | ORAL | Status: DC | PRN

## 2021-04-27 MED ORDER — STROKE: EARLY STAGES OF RECOVERY BOOK
Freq: Once | Status: AC
  Administered 2021-04-28: 1

## 2021-04-27 MED ORDER — LATANOPROST 0.005 % OP SOLN
1.0000 [drp] | Freq: Every day | OPHTHALMIC | Status: DC
  Filled 2021-04-27: qty 2.5

## 2021-04-27 MED ORDER — IOHEXOL 350 MG/ML SOLN
60.0000 mL | Freq: Once | INTRAVENOUS | Status: AC | PRN
  Administered 2021-04-27: 60 mL via INTRAVENOUS

## 2021-04-27 MED ORDER — SODIUM CHLORIDE 0.9 % IV SOLN: INTRAVENOUS | Status: DC

## 2021-04-27 MED ORDER — SODIUM CHLORIDE 0.9% FLUSH
3.0000 mL | Freq: Once | INTRAVENOUS | Status: AC
  Administered 2021-04-28: 3 mL via INTRAVENOUS

## 2021-04-27 MED ORDER — ENOXAPARIN SODIUM 30 MG/0.3ML IJ SOSY
30.0000 mg | PREFILLED_SYRINGE | INTRAMUSCULAR | Status: DC
  Administered 2021-04-28: 30 mg via SUBCUTANEOUS
  Filled 2021-04-27: qty 0.3

## 2021-04-27 MED ORDER — TIMOLOL MALEATE 0.5 % OP SOLN
1.0000 [drp] | Freq: Every morning | OPHTHALMIC | Status: DC
Start: 2021-04-28 — End: 2021-04-28
  Administered 2021-04-28: 1 [drp] via OPHTHALMIC
  Filled 2021-04-27: qty 5

## 2021-04-27 MED ORDER — ALBUTEROL SULFATE HFA 108 (90 BASE) MCG/ACT IN AERS
1.0000 | INHALATION_SPRAY | Freq: Four times a day (QID) | RESPIRATORY_TRACT | Status: DC | PRN
Start: 2021-04-27 — End: 2021-04-28

## 2021-04-27 MED ORDER — FLUTICASONE FUROATE-VILANTEROL 200-25 MCG/ACT IN AEPB
1.0000 | INHALATION_SPRAY | Freq: Every day | RESPIRATORY_TRACT | Status: DC
Start: 2021-04-28 — End: 2021-04-28
  Administered 2021-04-28: 1 via RESPIRATORY_TRACT
  Filled 2021-04-27: qty 28

## 2021-04-27 MED ORDER — ASPIRIN EC 325 MG PO TBEC
650.0000 mg | DELAYED_RELEASE_TABLET | Freq: Once | ORAL | Status: AC
  Administered 2021-04-27: 650 mg via ORAL
  Filled 2021-04-27: qty 2

## 2021-04-27 MED ORDER — ACETAMINOPHEN 650 MG RE SUPP: 650.0000 mg | RECTAL | Status: DC | PRN

## 2021-04-27 MED ORDER — ASPIRIN EC 81 MG PO TBEC: 81.0000 mg | DELAYED_RELEASE_TABLET | Freq: Every day | ORAL | Status: DC

## 2021-04-27 MED ORDER — ASPIRIN EC 325 MG PO TBEC
325.0000 mg | DELAYED_RELEASE_TABLET | Freq: Every day | ORAL | Status: DC
  Administered 2021-04-28: 325 mg via ORAL
  Filled 2021-04-27: qty 1

## 2021-04-27 MED ORDER — ACETAMINOPHEN 325 MG PO TABS: 650.0000 mg | ORAL_TABLET | ORAL | Status: DC | PRN

## 2021-04-27 MED ORDER — SENNOSIDES-DOCUSATE SODIUM 8.6-50 MG PO TABS: 1.0000 | ORAL_TABLET | Freq: Every evening | ORAL | Status: DC | PRN

## 2021-04-27 NOTE — H&P (Signed)
History and Physical   [name] ZHY:[id number] DOB: 06-20-1925 DOA: 04/27/2021  PCP: [name], DO   Patient coming from: Home  Chief Complaint: Word finding difficulty  HPI: Charles Golden is a 87 y.o. male with medical history significant of hearing loss, BPH, bronchiectasis, COPD, GERD, IBS, RLS, CKD presenting after having episode of word finding difficulty.  Patient did attend his wife's funeral today.  He did have an episode earlier today of word finding difficulty lasting around 10 minutes.  After this resolved he did have some issues with his gait.  He denies any confusion in the event.  No other focal neurologic deficits reported.  He denies fevers, chills, chest pain, shortness of breath, abdominal pain, constipation, diarrhea, nausea, vomiting.   ED Course: Vital signs in the ED significant for blood pressure in the 295M 841L systolic.  Lab work-up showed CMP with creatinine stable at 1.66, calcium 8.8, protein 6.0.  CBC within normal limits.  PT, PTT, INR within normal limits.  CT of the head showed no acute normality but did show a hyperdense left MCA.  CT a of the head neck showed occluded left internal carotid suspicious for acute occlusion.  Patient received aspirin and started on a rate of IV fluids in the ED.  Neurology was consulted and based on initial findings recommend observation with no intervention, but proceed with stroke work up.  Review of Systems: As per HPI otherwise all other systems reviewed and are negative.  Past Medical History:  Diagnosis Date   Bronchitis, chronic (HCC)    COPD (chronic obstructive pulmonary disease) (HCC)    Enlarged prostate    GERD (gastroesophageal reflux disease)    Glaucoma    Glucagonoma    Hernia, incisional    at present   [facility] (hard of hearing)    IBS (irritable bowel syndrome)    Sciatic pain    Sinus congestion     Past Surgical History:  Procedure Laterality Date   BOWEL RESECTION N/A 09/04/2012    Procedure: SMALL BOWEL RESECTION;  Surgeon: Donato Heinz, MD;  Location: [facility];  Service: General;  Laterality: N/A;  Anastimosis   HEMORRHOID SURGERY     HERNIA REPAIR Bilateral 70's   INGUINAL HERNIA REPAIR Left 09/14/2013   Procedure: LEFT INGUINAL HERNIORRHAPHY;  Surgeon: Jamesetta So, MD;  Location: [facility];  Service: General;  Laterality: Left;   INGUINAL HERNIA REPAIR Right 12/23/2018   Procedure: RECURRENT RIGHT INGUINAL HERNIA  REPAIR  WITH MESH;  Surgeon: Aviva Signs, MD;  Location: [facility];  Service: General;  Laterality: Right;   INSERTION OF MESH Left 09/14/2013   Procedure: INSERTION OF MESH;  Surgeon: Jamesetta So, MD;  Location: [facility];  Service: General;  Laterality: Left;   KNEE ARTHROSCOPY Left 07/21/2014   Procedure: LEFT KNEE ARTHROSCOPY WITH MEDIAL MENISCAL DEBRIDEMENT ;  Surgeon: Gearlean Alf, MD;  Location: [facility];  Service: Orthopedics;  Laterality: Left;   LAPAROTOMY N/A 09/04/2012   Procedure: EXPLORATORY LAPAROTOMY;  Surgeon: Donato Heinz, MD;  Location: [facility];  Service: General;  Laterality: N/A;   SKIN LESION EXCISION     [name]   TONSILLECTOMY      Social History  reports that he quit smoking about 14 years ago. His smoking use included cigarettes. He has a 30.00 pack-year smoking history. He has never used smokeless tobacco. He reports that he does not currently use alcohol. He reports that he does not use  drugs.  Allergies  Allergen Reactions   Demerol [Meperidine] Other (See Comments)    Pt. States "he woke up during a colonoscopy"   Spiriva Handihaler [Tiotropium Bromide Monohydrate] Other (See Comments)    Mild Urinary Retention    Family History  Problem Relation Age of Onset   Heart disease Mother        Valve replacement and pacemaker   Colon cancer Father    Asthma Other        cousin   Healthy Daughter    Healthy Daughter    Healthy Daughter   Reviewed on admission  Prior to Admission medications   Medication Sig Start  Date End Date Taking? Authorizing Provider  azelastine (ASTELIN) 0.1 % nasal spray PLACE 1 SPRAY IN EACH NOSTRIL ONCE A DAY AS DIRECTED Patient taking differently: 1 spray 2 (two) times daily. 01/17/21  Yes Gottschalk, Ashly M, DO  BREO ELLIPTA 200-25 MCG/INH AEPB Inhale 1 puff into the lungs daily. 01/17/21  Yes Ronnie Doss M, DO  Cholecalciferol (VITAMIN D3) 2000 UNITS TABS Take 1 tablet by mouth daily.   Yes [provider]  Cyanocobalamin (VITAMIN B-12) 5000 MCG SUBL Place 1 tablet under the tongue daily.   Yes [provider]  guaiFENesin (MUCINEX) 600 MG 12 hr tablet Take 600 mg by mouth daily as needed for to loosen phlegm.   Yes [provider]  latanoprost (XALATAN) 0.005 % ophthalmic solution Place 1 drop into the left eye at bedtime. 06/01/16  Yes Chipper Herb, MD  tamsulosin (FLOMAX) 0.4 MG CAPS capsule Take 0.8 mg by mouth daily. 02/11/19  Yes [provider]  timolol (TIMOPTIC) 0.5 % ophthalmic solution Place 1 drop into the left eye every morning. 11/24/18  Yes [provider]  VENTOLIN HFA 108 (90 Base) MCG/ACT inhaler INHALE 2 PUFFS EVERY 6 HOURS AS NEEDED FOR WHEEZING OR SHORTNESS OF BREATH Patient taking differently: 1 puff every 6 (six) hours as needed for shortness of breath. 02/28/21  Yes Gottschalk, Ashly M, DO  finasteride (PROSCAR) 5 MG tablet Take 1 tablet (5 mg total) by mouth daily. Patient not taking: Reported on 04/27/2021 06/10/20   [name], DO    Physical Exam: Vitals:   04/27/21 1852 04/27/21 2000  BP: (!) 157/84 (!) 146/75  Pulse: 66 80  Resp: 16 (!) 25  Temp: (!) 97.1 F (36.2 C)   TempSrc: Temporal   SpO2: 100% 98%   Physical Exam Constitutional:      General: He is not in acute distress.    Appearance: Normal appearance.  HENT:     Head: Normocephalic and atraumatic.     Mouth/Throat:     Mouth: Mucous membranes are moist.     Pharynx: Oropharynx is clear.  Eyes:     Extraocular  Movements: Extraocular movements intact.     Pupils: Pupils are equal, round, and reactive to light.  Cardiovascular:     Rate and Rhythm: Normal rate and regular rhythm.     Pulses: Normal pulses.     Heart sounds: Normal heart sounds.  Pulmonary:     Effort: Pulmonary effort is normal. No respiratory distress.     Breath sounds: Normal breath sounds.  Abdominal:     General: Bowel sounds are normal. There is no distension.     Palpations: Abdomen is soft.     Tenderness: There is no abdominal tenderness.  Musculoskeletal:        General: No swelling or deformity.  Skin:    General: Skin is warm and dry.  Neurological:     Comments: Mental Status: Patient is awake, alert, oriented x3 No signs of aphasia or neglect Cranial Nerves: II: Pupils equal, round, and reactive to light.   III,IV, VI: EOMI without ptosis or diploplia.  V: Facial sensation is symmetric to light touch. VII: Facial movement is symmetric.  VIII: hearing is intact to voice X: Uvula elevates symmetrically XI: Shoulder shrug is symmetric. XII: tongue is midline without atrophy or fasciculations.  Motor: Good effort thorughout, at Least 5/5 bilateral UE, 5/5 bilateral lower extremitiy  Sensory: Sensation is grossly intact bilateral UEs & LEs Cerebellar: Finger-Nose intact bilalat     Labs on Admission: I have personally reviewed following labs and imaging studies  CBC: Recent Labs  Lab 04/27/21 1826 04/27/21 1846  WBC 9.6  --   NEUTROABS 6.1  --   HGB 14.4 15.0  HCT 44.1 44.0  MCV 95.9  --   PLT 280  --     Basic Metabolic Panel: Recent Labs  Lab 04/27/21 1826 04/27/21 1846  NA 138 139  K 4.6 4.6  CL 107 105  CO2 24  --   GLUCOSE 94 92  BUN 23 25*  CREATININE 1.66* 1.70*  CALCIUM 8.8*  --     GFR: CrCl cannot be calculated (Unknown ideal weight.).  Liver Function Tests: Recent Labs  Lab 04/27/21 1826  AST 23  ALT 12  ALKPHOS 67  BILITOT 0.7  PROT 6.0*  ALBUMIN 3.5     Urine analysis:    Component Value Date/Time   COLORURINE YELLOW 09/04/2012 1334   APPEARANCEUR Clear 01/31/2016 1513   LABSPEC >1.030 (H) 09/04/2012 1334   PHURINE 5.5 09/04/2012 1334   GLUCOSEU Negative 01/31/2016 1513   HGBUR NEGATIVE 09/04/2012 1334   BILIRUBINUR Negative 01/31/2016 1513   KETONESUR NEGATIVE 09/04/2012 1334   PROTEINUR Negative 01/31/2016 1513   PROTEINUR NEGATIVE 09/04/2012 1334   UROBILINOGEN 0.2 09/04/2012 1334   NITRITE Negative 01/31/2016 1513   NITRITE NEGATIVE 09/04/2012 1334   LEUKOCYTESUR Negative 01/31/2016 1513    Radiological Exams on Admission: CT HEAD CODE STROKE WO CONTRAST  Result Date: 04/27/2021 CLINICAL DATA:  Code stroke.  Acute neuro deficit. EXAM: CT HEAD WITHOUT CONTRAST TECHNIQUE: Contiguous axial images were obtained from the base of the skull through the vertex without intravenous contrast. COMPARISON:  MRI head 05/14/2013.  CT head 03/11/2009 FINDINGS: Brain: Generalized atrophy. Negative for hydrocephalus. Negative for acute infarct, hemorrhage, mass. Vascular: Hyperdensity of the left middle cerebral artery in the sylvian fissure. Probable left M2 branch occlusion. Correlate with symptoms. Skull: Negative Sinuses/Orbits: Mild mucosal edema right maxillary sinus. Bilateral cataract extraction Other: None ASPECTS ([place] Stroke Program Early CT Score) - Ganglionic level infarction (caudate, lentiform nuclei, internal capsule, insula, M1-M3 cortex): 7 - Supraganglionic infarction (M4-M6 cortex): 3 Total score (0-10 with 10 being normal): 10 IMPRESSION: 1. Negative for acute infarct or hemorrhage 2. Hyperdense left MCA branch in the sylvian fissure likely due to acute thrombosis. Correlate with symptoms. 3. ASPECTS is 10 4. Code stroke imaging results were communicated on 04/27/2021 at 6:39 pm to provider Lindzen via text page amion Electronically Signed   By: Franchot Gallo M.D.   On: 04/27/2021 18:46   CT ANGIO HEAD NECK W WO CM (CODE  STROKE)  Result Date: 04/27/2021 CLINICAL DATA:  Acute neuro deficit. Speech abnormality which has cleared. EXAM: CT ANGIOGRAPHY HEAD AND NECK TECHNIQUE: Multidetector CT imaging of the  head and neck was performed using the standard protocol during bolus administration of intravenous contrast. Multiplanar CT image reconstructions and MIPs were obtained to evaluate the vascular anatomy. Carotid stenosis measurements (when applicable) are obtained utilizing NASCET criteria, using the distal internal carotid diameter as the denominator. CONTRAST:  69mL OMNIPAQUE IOHEXOL 350 MG/ML SOLN COMPARISON:  CT head 04/27/2021 FINDINGS: CTA NECK FINDINGS Aortic arch: Atherosclerotic aortic arch and proximal great vessels. Proximal great vessels are patent. Right carotid system: Atherosclerotic calcification right carotid bifurcation without significant stenosis Left carotid system: Atherosclerotic disease at the left carotid bifurcation. Occlusion of the proximal left internal carotid artery. Left external carotid artery patent. Vertebral arteries: Both vertebral arteries are patent to the skull base with mild stenosis proximally on the right. Skeleton: Cervical spondylosis.  No acute skeletal abnormality. Other neck: Negative for mass or adenopathy Upper chest: Lung apices clear bilaterally. Review of the MIP images confirms the above findings CTA HEAD FINDINGS Anterior circulation: Right cavernous carotid widely patent. Right anterior and right middle cerebral arteries widely patent. Left internal carotid artery is occluded with reconstitution of the supraclinoid segment. Left anterior cerebral artery patent. Left M1 segment patent. Occlusion of the proximal left M2 branch inferior division which appears acute. Posterior circulation: Both vertebral arteries patent to the basilar. PICA patent bilaterally. Basilar widely patent. Superior cerebellar and posterior cerebral arteries patent bilaterally. Venous sinuses: Normal  venous enhancement Anatomic variants: None Review of the MIP images confirms the above findings IMPRESSION: 1. Occlusion of the proximal left internal carotid artery of indeterminate age. However, this is likely acute given the acute occlusion of the inferior division of the left MCA M2 branch. This was hyperdense on CT consistent with acute thrombus. 2. No significant right carotid stenosis. No significant vertebral stenosis. 3. These results were called by telephone at the time of interpretation on 04/27/2021 at 7:24 pm to provider ERIC [facility] , who verbally acknowledged these results. Electronically Signed   By: Franchot Gallo M.D.   On: 04/27/2021 19:29    EKG: Independently reviewed.  Atrial fibrillation versus sinus arrhythmia with PACs at 69 bpm.  Nonspecific T wave flattening.  Assessment/Plan Principal Problem:   TIA (transient ischemic attack) Active Problems:   BPH (benign prostatic hyperplasia)   COPD GOLD I   Acute CVA > Patient did have episode word-finding difficulty lasting 10 minutes earlier today.  Also reported some gait difficulties following this. > Seen by neurology as a code stroke and they are following. > CT head with hyperdense left MCA.  CTA of the head neck showing occlusion of the left internal carotid and left M2.  Suspected to be acute likely embolic. > Neurology recommending no acute intervention such as stenting given his symptoms have resolved.  We will proceed with stroke work-up. - Appreciate neurology recommendations - ASA 325 mg daily  - Atorvastatin - Echocardiogram  - A1C  - Lipid panel  - Tele monitoring  - SLP eval - PT/OT  COPD - Continue home Breo and as needed albuterol  BPH - Continue home finasteride  DVT prophylaxis: Lovenox  Code Status:   Full Family Communication:  Family members updated at bedside Disposition Plan:   Patient is from:  Home  Anticipated DC to:  Home  Anticipated DC date:  1 to 3 days  Anticipated DC  barriers: None  Consults called:  Neurology seen patient in the ED as code stroke and are following.   Admission status:  Observation, telemetry   Severity of Illness:  The appropriate patient status for this patient is OBSERVATION. Observation status is judged to be reasonable and necessary in order to provide the required intensity of service to ensure the patient's safety. [name] presenting symptoms, physical exam findings, and initial radiographic and laboratory data in the context of their medical condition is felt to place them at decreased risk for further clinical deterioration. Furthermore, it is anticipated that the patient will be medically stable for discharge from the hospital within 2 midnights of admission.    Marcelyn Bruins MD [facility]  How to contact the [facility] Attending or Consulting provider [place] or covering provider during after hours [place], for this patient?   Check the care team in [facility] and look for a) attending/consulting [facility] provider listed and b) the [facility] team listed Log into [email or web address] and use [place]'s universal password to access. If you do not have the password, please contact the hospital operator. Locate the [facility] provider you are looking for under [facility] and page to a number that you can be directly reached. If you still have difficulty reaching the provider, please page the [facility] (Director on Call) for the Hospitalists listed on amion for assistance.  04/27/2021, 10:23 PM

## 2021-04-27 NOTE — ED Notes (Signed)
Code stroke activated per RN Anell Barr request, spoke to Fellows at [facility].

## 2021-04-27 NOTE — ED Notes (Signed)
Patient transported to CT with primary RN for repeat studies per Neuro MD Linzen.

## 2021-04-27 NOTE — ED Notes (Addendum)
EDP Lacretia Leigh at bedside with patient.

## 2021-04-27 NOTE — ED Provider Notes (Signed)
[facility] EMERGENCY DEPARTMENT Provider Note   CSN: [id number] Arrival date & time: 04/27/21  1813  An emergency department physician performed an initial assessment on this suspected stroke patient at [facility].  History Chief Complaint  Patient presents with   Code Stroke    Charles Golden is a 87 y.o. male.  87 year old male presents after having some word finding difficulties this afternoon.  States that lasted for approximately 10 minutes.  Did have some trouble with his gait after the speech issues resolved.  Denies any headache.  He did not feel confused.  He did attend the funeral of his wife today.  Denies any new medications.  No prior history of same.  No treatment use prior to arrival      Past Medical History:  Diagnosis Date   Bronchitis, chronic (HCC)    COPD (chronic obstructive pulmonary disease) (HCC)    Enlarged prostate    GERD (gastroesophageal reflux disease)    Glaucoma    Glucagonoma    Hernia, incisional    at present   [facility] (hard of hearing)    IBS (irritable bowel syndrome)    Sciatic pain    Sinus congestion     Patient Active Problem List   Diagnosis Date Noted   Chronic tension-type headache, not intractable 12/13/2020   Bilateral sensorineural hearing loss 09/25/2019   Bronchiectasis without complication ([place]) 26/94/8546   Fatigue 05/27/2019   Vitamin D deficiency 01/21/2019   Weakness 01/21/2019   Recurrent right inguinal hernia    Presbycusis of both ears 07/19/2017   Restless leg syndrome 11/25/2016   Collagenous colitis 07/24/2016   Deviated nasal septum 09/03/2015   Basal cell carcinoma of nose 09/03/2015   Thoracic aortic atherosclerosis ([place]) 05/03/2015   Upper airway cough syndrome 04/28/2015   GERD without esophagitis 04/11/2015   Acute medial meniscal tear 07/20/2014   COPD GOLD I  12/08/2013   Small bowel volvulus ([place]) 08/05/2013   Chronic headache 08/05/2013   Left inguinal hernia 08/05/2013    Incisional hernia, without obstruction or gangrene 08/05/2013   Headache 05/01/2013   Obstructive chronic bronchitis without exacerbation ([place]) 04/30/2013   Left hip pain 04/29/2013   BPH (benign prostatic hyperplasia) 01/05/2013   IBS (irritable bowel syndrome) 09/03/2012   Chronic rhinosinusitis 09/03/2012   Lung nodules 05/04/2003    Past Surgical History:  Procedure Laterality Date   BOWEL RESECTION N/A 09/04/2012   Procedure: SMALL BOWEL RESECTION;  Surgeon: Donato Heinz, MD;  Location: [facility];  Service: General;  Laterality: N/A;  Anastimosis   HEMORRHOID SURGERY     HERNIA REPAIR Bilateral 70's   INGUINAL HERNIA REPAIR Left 09/14/2013   Procedure: LEFT INGUINAL HERNIORRHAPHY;  Surgeon: Jamesetta So, MD;  Location: [facility];  Service: General;  Laterality: Left;   INGUINAL HERNIA REPAIR Right 12/23/2018   Procedure: RECURRENT RIGHT INGUINAL HERNIA  REPAIR  WITH MESH;  Surgeon: Aviva Signs, MD;  Location: [facility];  Service: General;  Laterality: Right;   INSERTION OF MESH Left 09/14/2013   Procedure: INSERTION OF MESH;  Surgeon: Jamesetta So, MD;  Location: [facility];  Service: General;  Laterality: Left;   KNEE ARTHROSCOPY Left 07/21/2014   Procedure: LEFT KNEE ARTHROSCOPY WITH MEDIAL MENISCAL DEBRIDEMENT ;  Surgeon: Gearlean Alf, MD;  Location: [facility];  Service: Orthopedics;  Laterality: Left;   LAPAROTOMY N/A 09/04/2012   Procedure: EXPLORATORY LAPAROTOMY;  Surgeon: Donato Heinz, MD;  Location: [facility];  Service: General;  Laterality: N/A;   SKIN LESION EXCISION     [name]   TONSILLECTOMY         Family History  Problem Relation Age of Onset   Heart disease Mother        Valve replacement and pacemaker   Colon cancer Father    Asthma Other        cousin   Healthy Daughter    Healthy Daughter    Healthy Daughter     Social History   Tobacco Use   Smoking status: Former    Packs/day: 1.00    Years: 30.00    Pack years: 30.00    Types: Cigarettes     Quit date: 05/28/2006    Years since quitting: 14.9   Smokeless tobacco: Never  Vaping Use   Vaping Use: Never used  Substance Use Topics   Alcohol use: Not Currently    Alcohol/week: 0.0 standard drinks   Drug use: No    Home Medications Prior to Admission medications   Medication Sig Start Date End Date Taking? Authorizing Provider  azelastine (ASTELIN) 0.1 % nasal spray PLACE 1 SPRAY IN EACH NOSTRIL ONCE A DAY AS DIRECTED 01/17/21   Gottschalk, Ashly M, DO  BREO ELLIPTA 200-25 MCG/INH AEPB Inhale 1 puff into the lungs daily. 01/17/21   [name], DO  Cholecalciferol (VITAMIN D3) 2000 UNITS TABS Take 1 tablet by mouth daily.    [provider]  Cyanocobalamin (VITAMIN B-12) 5000 MCG SUBL Place 1 tablet under the tongue daily.    [provider]  finasteride (PROSCAR) 5 MG tablet Take 1 tablet (5 mg total) by mouth daily. 06/10/20   [name], DO  guaiFENesin (MUCINEX) 600 MG 12 hr tablet Take 600 mg by mouth as needed. Reported on 05/25/2015    [provider]  latanoprost (XALATAN) 0.005 % ophthalmic solution Place 1 drop into the left eye at bedtime. 06/01/16   Chipper Herb, MD  tamsulosin (FLOMAX) 0.4 MG CAPS capsule Take 0.8 mg by mouth daily. 02/11/19   [provider]  timolol (TIMOPTIC) 0.5 % ophthalmic solution Place 1 drop into the left eye every morning. 11/24/18   [provider]  VENTOLIN HFA 108 (90 Base) MCG/ACT inhaler INHALE 2 PUFFS EVERY 6 HOURS AS NEEDED FOR WHEEZING OR SHORTNESS OF BREATH 02/28/21   Ronnie Doss M, DO    Allergies    Demerol [meperidine] and Spiriva handihaler [tiotropium bromide monohydrate]  Review of Systems   Review of Systems  All other systems reviewed and are negative.  Physical Exam Updated Vital Signs BP (!) 157/84 (BP Location: Right Arm)   Pulse 66   Temp (!) 97.1 F (36.2 C) (Temporal)   Resp 16   SpO2 100%   Physical Exam Vitals and nursing note reviewed.   Constitutional:      General: He is not in acute distress.    Appearance: Normal appearance. He is well-developed. He is not toxic-appearing.  HENT:     Head: Normocephalic and atraumatic.  Eyes:     General: Lids are normal.     Conjunctiva/sclera: Conjunctivae normal.     Pupils: Pupils are equal, round, and reactive to light.  Neck:     Thyroid: No thyroid mass.     Trachea: No tracheal deviation.  Cardiovascular:     Rate and Rhythm: Normal rate and regular rhythm.     Heart sounds: Normal heart sounds. No murmur heard.   No gallop.  Pulmonary:     Effort: Pulmonary effort is normal. No respiratory distress.     Breath sounds: Normal breath sounds. No stridor. No decreased breath sounds, wheezing, rhonchi or rales.  Abdominal:     General: There is no distension.     Palpations: Abdomen is soft.     Tenderness: There is no abdominal tenderness. There is no rebound.  Musculoskeletal:        General: No tenderness. Normal range of motion.     Cervical back: Normal range of motion and neck supple.  Skin:    General: Skin is warm and dry.     Findings: No abrasion or rash.  Neurological:     General: No focal deficit present.     Mental Status: He is alert and oriented to person, place, and time. Mental status is at baseline.     GCS: GCS eye subscore is 4. GCS verbal subscore is 5. GCS motor subscore is 6.     Cranial Nerves: Cranial nerves 2-12 are intact. No cranial nerve deficit.     Sensory: No sensory deficit.     Motor: Motor function is intact.  Psychiatric:        Attention and Perception: Attention normal.        Speech: Speech normal.        Behavior: Behavior normal.    ED Results / Procedures / Treatments   Labs (all labs ordered are listed, but only abnormal results are displayed) Labs Reviewed  I-STAT CHEM 8, ED - Abnormal; Notable for the following components:      Result Value   BUN 25 (*)    Creatinine, Ser 1.70 (*)    All other components within  normal limits  CBC  DIFFERENTIAL  PROTIME-INR  APTT  COMPREHENSIVE METABOLIC PANEL  CBG MONITORING, ED    EKG None  Radiology CT HEAD CODE STROKE WO CONTRAST  Result Date: 04/27/2021 CLINICAL DATA:  Code stroke.  Acute neuro deficit. EXAM: CT HEAD WITHOUT CONTRAST TECHNIQUE: Contiguous axial images were obtained from the base of the skull through the vertex without intravenous contrast. COMPARISON:  MRI head 05/14/2013.  CT head 03/11/2009 FINDINGS: Brain: Generalized atrophy. Negative for hydrocephalus. Negative for acute infarct, hemorrhage, mass. Vascular: Hyperdensity of the left middle cerebral artery in the sylvian fissure. Probable left M2 branch occlusion. Correlate with symptoms. Skull: Negative Sinuses/Orbits: Mild mucosal edema right maxillary sinus. Bilateral cataract extraction Other: None ASPECTS ([place] Stroke Program Early CT Score) - Ganglionic level infarction (caudate, lentiform nuclei, internal capsule, insula, M1-M3 cortex): 7 - Supraganglionic infarction (M4-M6 cortex): 3 Total score (0-10 with 10 being normal): 10 IMPRESSION: 1. Negative for acute infarct or hemorrhage 2. Hyperdense left MCA branch in the sylvian fissure likely due to acute thrombosis. Correlate with symptoms. 3. ASPECTS is 10 4. Code stroke imaging results were communicated on 04/27/2021 at 6:39 pm to provider Lindzen via text page amion Electronically Signed   By: Franchot Gallo M.D.   On: 04/27/2021 18:46    Procedures Procedures   Medications Ordered in ED Medications  sodium chloride flush (NS) 0.9 % injection 3 mL (has no administration in time range)    ED Course  I have reviewed the triage vital signs and the nursing notes.  Pertinent labs & imaging results that were available during my care of the patient were reviewed by me and considered in my medical decision making (see chart for details).    MDM Rules/Calculators/A&P  Patient seen by neurology as a code  stroke.  Patient imaging results noted as above.  Plan is to hold off intervention acutely at this time and to monitor.  Will admit to the hospital service Final Clinical Impression(s) / ED Diagnoses Final diagnoses:  None    Rx / DC Orders ED Discharge Orders     None        Lacretia Leigh, MD 04/27/21 2007

## 2021-04-27 NOTE — ED Notes (Signed)
On-call neurologist, Linzen, at bedside with patient performing assessment.

## 2021-04-27 NOTE — ED Provider Notes (Signed)
Emergency Medicine Provider Triage Evaluation Note  Charles Golden , a 87 y.o. male  was evaluated in triage.  Pt complains of slurred speech, difficulty ambulating x2 hours ago after his wife's funeral.  Reports sitting in the car with his son, unable to speak any sentences, feels like he is unable to find his words.  Had difficulty ambulating after getting out of the car.  The symptoms have now resolved but they lasted for approximately 10 minutes.  Review of Systems  Positive: Slurred speech, difficulty gait Negative: Fever, headache, chest pain, shortness of breath  Physical Exam  There were no vitals taken for this visit. Gen:   Awake, no distress   Resp:  Normal effort  MSK:   Moves extremities without difficulty  Other:  Facial asymmetry, no slurred speech noted on my eval, sensation is intact throughout.  Medical Decision Making  Medically screening exam initiated at 6:25 PM.  Appropriate orders placed.  EGYPT MARCHIANO was informed that the remainder of the evaluation will be completed by another provider, this initial triage assessment does not replace that evaluation, and the importance of remaining in the ED until their evaluation is complete.   And here with episode of slurred speech, difficulty with his gait, which later resolved after 10 minutes.  However patient is currently in the window.  Has a hard time expressing his words.  Last normal 1430   Portions of this note were generated with Lobbyist. Dictation errors may occur despite best attempts at proofreading.     Janeece Fitting, PA-C 04/27/21 1826    Daleen Bo, MD 04/27/21 [phone]

## 2021-04-27 NOTE — Consult Note (Signed)
NEURO HOSPITALIST CONSULT NOTE   Requestig physician: Dr. Zenia Resides  Reason for Consult: Transient dysarthria  History obtained from:  Patient and Chart     HPI:                                                                                                                                          Charles Golden is an 87 y.o. male presenting to the ED with a chief complaint of dysarthria and difficulty ambulating which suddenly started after he had attended the funeral of his wife. He was evaluated in triage and a Code Stroke was called. Symptoms started about 2 hours prior to presentation and resolved after about 10 minutes. He reported sitting in the car with his son, unable to speak any sentences, feeling like he was unable to find his words. He had difficulty ambulating after getting out of the car. Symptoms continued to be resolved in Triage and in CT after the Code Stroke was called.   CT head revealed no acute hemorrhage or hypodensity, but a dense left MCA sign was noted and CTA was then ordered, revealing a left ICA occlusion and left M2 occlusion. Patient has remained asymptomatic with a normal neurological exam while in the ED.   His PMHx includes COPD, sciatic pain, IBS, incisional hernia, glucagonoma and GERD. He has no prior history of stroke.   Past Medical History:  Diagnosis Date   Bronchitis, chronic (HCC)    COPD (chronic obstructive pulmonary disease) (HCC)    Enlarged prostate    GERD (gastroesophageal reflux disease)    Glaucoma    Glucagonoma    Hernia, incisional    at present   [facility] (hard of hearing)    IBS (irritable bowel syndrome)    Sciatic pain    Sinus congestion     Past Surgical History:  Procedure Laterality Date   BOWEL RESECTION N/A 09/04/2012   Procedure: SMALL BOWEL RESECTION;  Surgeon: Donato Heinz, MD;  Location: [facility];  Service: General;  Laterality: N/A;  Anastimosis   HEMORRHOID SURGERY     HERNIA REPAIR Bilateral 70's    INGUINAL HERNIA REPAIR Left 09/14/2013   Procedure: LEFT INGUINAL HERNIORRHAPHY;  Surgeon: Jamesetta So, MD;  Location: [facility];  Service: General;  Laterality: Left;   INGUINAL HERNIA REPAIR Right 12/23/2018   Procedure: RECURRENT RIGHT INGUINAL HERNIA  REPAIR  WITH MESH;  Surgeon: Aviva Signs, MD;  Location: [facility];  Service: General;  Laterality: Right;   INSERTION OF MESH Left 09/14/2013   Procedure: INSERTION OF MESH;  Surgeon: Jamesetta So, MD;  Location: [facility];  Service: General;  Laterality: Left;   KNEE ARTHROSCOPY Left 07/21/2014   Procedure: LEFT KNEE ARTHROSCOPY WITH MEDIAL MENISCAL DEBRIDEMENT ;  Surgeon: Pilar Plate  Zella Ball, MD;  Location: [facility];  Service: Orthopedics;  Laterality: Left;   LAPAROTOMY N/A 09/04/2012   Procedure: EXPLORATORY LAPAROTOMY;  Surgeon: Donato Heinz, MD;  Location: [facility];  Service: General;  Laterality: N/A;   SKIN LESION EXCISION     [name]   TONSILLECTOMY      Family History  Problem Relation Age of Onset   Heart disease Mother        Valve replacement and pacemaker   Colon cancer Father    Asthma Other        cousin   Healthy Daughter    Healthy Daughter    Healthy Daughter               Social History:  reports that he quit smoking about 14 years ago. His smoking use included cigarettes. He has a 30.00 pack-year smoking history. He has never used smokeless tobacco. He reports that he does not currently use alcohol. He reports that he does not use drugs.  Allergies  Allergen Reactions   Demerol [Meperidine] Other (See Comments)    Pt. States "he woke up during a colonoscopy"   Spiriva Handihaler [Tiotropium Bromide Monohydrate] Other (See Comments)    Mild Urinary Retention    HOME MEDICATIONS:                                                                                                                      No current facility-administered medications on file prior to encounter.   Current Outpatient Medications on File  Prior to Encounter  Medication Sig Dispense Refill   azelastine (ASTELIN) 0.1 % nasal spray PLACE 1 SPRAY IN EACH NOSTRIL ONCE A DAY AS DIRECTED 30 mL 4   BREO ELLIPTA 200-25 MCG/INH AEPB Inhale 1 puff into the lungs daily. 60 each 4   Cholecalciferol (VITAMIN D3) 2000 UNITS TABS Take 1 tablet by mouth daily.     Cyanocobalamin (VITAMIN B-12) 5000 MCG SUBL Place 1 tablet under the tongue daily.     guaiFENesin (MUCINEX) 600 MG 12 hr tablet Take 600 mg by mouth as needed. Reported on 05/25/2015     latanoprost (XALATAN) 0.005 % ophthalmic solution Place 1 drop into the left eye at bedtime. 2.5 mL 1   tamsulosin (FLOMAX) 0.4 MG CAPS capsule Take 0.8 mg by mouth daily.     timolol (TIMOPTIC) 0.5 % ophthalmic solution Place 1 drop into the left eye every morning.     finasteride (PROSCAR) 5 MG tablet Take 1 tablet (5 mg total) by mouth daily. 90 tablet 3   VENTOLIN HFA 108 (90 Base) MCG/ACT inhaler INHALE 2 PUFFS EVERY 6 HOURS AS NEEDED FOR WHEEZING OR SHORTNESS OF BREATH 18 g 0    ROS:  Does not endorse any symptoms other than as documented in the HPI.    BP (!) 157/84 (BP Location: Right Arm)   Pulse 66   Temp (!) 97.1 F (36.2 C) (Temporal)   Resp 16   SpO2 100%    General Examination:                                                                                                       Physical Exam  HEENT-  [place]/AT    Lungs- Respirations unlabored Skin: No rash to face or distal arms bilaterally   Neurological Examination Mental Status: Awake and alert. Oriented x 5. Speech fluent without dysarthria. Naming and comprehension intact. Good insight.  Cranial Nerves: II: Temporal visual fields intact with no extinction to DSS. PERRL.  III,IV, VI: No ptosis. EOMI. No nystagmus.  V: Temp sensation equal bilaterally VII: Smile is symmetric. No asymmetry of  facial movements while speaking.  VIII: Hearing intact to voice.  IX,X: No hypophonia XI: Symmetric XII: Midline tongue extension Motor: BUE 5/5 proximally and distally No pronator drift.  BLE 5/5 Sensory: Temp and light touch intact throughout, bilaterally. No extinction to DSS.  Deep Tendon Reflexes: 2+ bilateral brachioradialis and biceps. Hypoactive patellar reflexes.  Cerebellar: No ataxia with FNF bilaterally  Gait: Normal gait and station. No unsteadiness.    Lab Results: Basic Metabolic Panel: Recent Labs  Lab 04/27/21 1846  NA 139  K 4.6  CL 105  GLUCOSE 92  BUN 25*  CREATININE 1.70*    CBC: Recent Labs  Lab 04/27/21 1846  HGB 15.0  HCT 44.0    Cardiac Enzymes: No results for input(s): CKTOTAL, CKMB, CKMBINDEX, TROPONINI in the last 168 hours.  Lipid Panel: No results for input(s): CHOL, TRIG, HDL, CHOLHDL, VLDL, LDLCALC in the last 168 hours.  Imaging: CT HEAD CODE STROKE WO CONTRAST  Result Date: 04/27/2021 CLINICAL DATA:  Code stroke.  Acute neuro deficit. EXAM: CT HEAD WITHOUT CONTRAST TECHNIQUE: Contiguous axial images were obtained from the base of the skull through the vertex without intravenous contrast. COMPARISON:  MRI head 05/14/2013.  CT head 03/11/2009 FINDINGS: Brain: Generalized atrophy. Negative for hydrocephalus. Negative for acute infarct, hemorrhage, mass. Vascular: Hyperdensity of the left middle cerebral artery in the sylvian fissure. Probable left M2 branch occlusion. Correlate with symptoms. Skull: Negative Sinuses/Orbits: Mild mucosal edema right maxillary sinus. Bilateral cataract extraction Other: None ASPECTS ([place] Stroke Program Early CT Score) - Ganglionic level infarction (caudate, lentiform nuclei, internal capsule, insula, M1-M3 cortex): 7 - Supraganglionic infarction (M4-M6 cortex): 3 Total score (0-10 with 10 being normal): 10 IMPRESSION: 1. Negative for acute infarct or hemorrhage 2. Hyperdense left MCA branch in the sylvian  fissure likely due to acute thrombosis. Correlate with symptoms. 3. ASPECTS is 10 4. Code stroke imaging results were communicated on 04/27/2021 at 6:39 pm to provider Wildon Cuevas via text page amion Electronically Signed   By: Franchot Gallo M.D.   On: 04/27/2021 18:46     Assessment: 87 year old male presenting with transient dysarthria 1. Exam is nonfocal. No dysarthria  or dysphasia noted.  2. CT head: Negative for acute infarct or hemorrhage. ASPECTS is 10. Hyperdense left MCA branch in the sylvian fissure likely due to acute thrombosis.  3. Most likely etiology for presentation is TIA 4. Stroke risk factors: Advanced age 85. Not a candidate for IV thrombolysis given NIHSS of 0   Recommendations: 1. STAT CTA of head and neck 2. Further recommendations pending CTA.   Addendum: - CTA of head and neck reveals occlusion of the proximal left internal carotid artery of indeterminate age. However, this is likely acute given the acute occlusion of the inferior division of the left MCA M2 branch. This was hyperdense on CT consistent with acute thrombus. No significant right carotid stenosis. No significant vertebral stenosis. - Given NIHSS of 0 he is not a candidate for thrombectomy. Discussed with Dr. Margarita Sermons.  - Permissive HTN x 24 hours.  - ASA 650 mg crushed po x 1 now, then 81 mg po qd in conjunction with Plavix 75 mg po qd x 21 days, then continue with ASA monotherapy - Start atorvastatin - Q63minute neuro checks - PT/OT/Speech - IV NS at 75 cc/hr - Discussed with Dr. Rory Percy in sign out.    Electronically signed: Dr. Kerney Elbe 04/27/2021, 6:49 PM

## 2021-04-27 NOTE — ED Notes (Signed)
Phlebotomy Collected lab work on pt

## 2021-04-27 NOTE — ED Notes (Signed)
Patient transported to CT with primary RN. 

## 2021-04-27 NOTE — Code Documentation (Addendum)
Stroke Response Nurse Documentation Code Documentation  Charles Golden is a 86 y.o. male arriving to [facility] ED via Private Vehicle on 04/27/21. On No antithrombotic. Code stroke was activated by ED PA in triage.   Patient from home where he was LKW at 1430 and now complaining of slurred speech and gait imbalance. Patient had just gotten home from his wife's funeral and was talking with his children when he noticed having trouble speaking and some dizziness. Family called EMS and after they arrived they encouraged him to go to the ED but refused. When slurred speech occurred again pt agreed to go to ED by POV. In triage EDP activated the code stroke. Symptoms had completely resolved by the time he was in CT.    Stroke team at the bedside on patient arrival. Labs drawn and patient cleared for CT by Dr. Zenia Resides. Patient to CT with team. NIHSS 0, see documentation for details and code stroke times. The following imaging was completed:  CT, CTA head and neck. Patient is not a candidate for IV Thrombolytic due to symptoms resolved and patient too good to treat. Patient is not a candidate for IR due to asymptomatic but MD did call SRN to make team aware that pt does have areas of LVO's.    Care/Plan: Frequent monitoring q15 neuro checks and vitals, permissive HTN <220/120, admission to neuro progressive unit.   Bedside handoff with ED RN Lanny Hurst.    Johanne Mcglade, Rande Brunt  Stroke Response RN

## 2021-04-28 ENCOUNTER — Observation Stay (HOSPITAL_COMMUNITY): Payer: Medicare Other

## 2021-04-28 ENCOUNTER — Encounter (HOSPITAL_COMMUNITY): Payer: Self-pay | Admitting: Internal Medicine

## 2021-04-28 ENCOUNTER — Other Ambulatory Visit: Payer: Self-pay

## 2021-04-28 ENCOUNTER — Observation Stay (HOSPITAL_BASED_OUTPATIENT_CLINIC_OR_DEPARTMENT_OTHER): Payer: Medicare Other

## 2021-04-28 DIAGNOSIS — I6522 Occlusion and stenosis of left carotid artery: Secondary | ICD-10-CM

## 2021-04-28 DIAGNOSIS — J449 Chronic obstructive pulmonary disease, unspecified: Secondary | ICD-10-CM | POA: Diagnosis not present

## 2021-04-28 DIAGNOSIS — I639 Cerebral infarction, unspecified: Secondary | ICD-10-CM | POA: Diagnosis not present

## 2021-04-28 DIAGNOSIS — R29818 Other symptoms and signs involving the nervous system: Secondary | ICD-10-CM | POA: Diagnosis not present

## 2021-04-28 DIAGNOSIS — I6389 Other cerebral infarction: Secondary | ICD-10-CM

## 2021-04-28 DIAGNOSIS — R351 Nocturia: Secondary | ICD-10-CM | POA: Diagnosis not present

## 2021-04-28 DIAGNOSIS — N401 Enlarged prostate with lower urinary tract symptoms: Secondary | ICD-10-CM | POA: Diagnosis not present

## 2021-04-28 LAB — LIPID PANEL
Cholesterol: 125 mg/dL (ref 0–200)
HDL: 45 mg/dL (ref >40)
LDL Cholesterol: 72 mg/dL (ref 0–99)
Total CHOL/HDL Ratio: 2.8 RATIO
Triglycerides: 39 mg/dL (ref <150)
VLDL: 8 mg/dL (ref 0–40)

## 2021-04-28 LAB — HEMOGLOBIN A1C
Hgb A1c MFr Bld: 6 % — ABNORMAL HIGH (ref 4.8–5.6)
Mean Plasma Glucose: 125.5 mg/dL

## 2021-04-28 LAB — ECHOCARDIOGRAM COMPLETE
Area-P 1/2: 2.29 cm2
Height: 70 in
S' Lateral: 2.7 cm
Weight: 2720 oz

## 2021-04-28 LAB — CBG MONITORING, ED: Glucose-Capillary: 84 mg/dL (ref 70–99)

## 2021-04-28 MED ORDER — CLOPIDOGREL BISULFATE 75 MG PO TABS
75.0000 mg | ORAL_TABLET | Freq: Every day | ORAL | Status: DC
  Administered 2021-04-28: 75 mg via ORAL
  Filled 2021-04-28: qty 1

## 2021-04-28 MED ORDER — ATORVASTATIN CALCIUM 10 MG PO TABS
20.0000 mg | ORAL_TABLET | Freq: Every day | ORAL | Status: DC
Start: 2021-04-28 — End: 2021-04-28

## 2021-04-28 MED ORDER — FINASTERIDE 5 MG PO TABS
5.0000 mg | ORAL_TABLET | Freq: Every day | ORAL | Status: DC
  Administered 2021-04-28: 5 mg via ORAL
  Filled 2021-04-28: qty 1

## 2021-04-28 MED ORDER — ATORVASTATIN CALCIUM 40 MG PO TABS: 40.0000 mg | ORAL_TABLET | Freq: Every day | ORAL | Status: DC

## 2021-04-28 MED ORDER — CLOPIDOGREL BISULFATE 75 MG PO TABS: 75.0000 mg | ORAL_TABLET | Freq: Every day | ORAL | 2 refills | Status: DC

## 2021-04-28 MED ORDER — BUTALBITAL-APAP-CAFFEINE 50-325-40 MG PO TABS: 1.0000 | ORAL_TABLET | Freq: Four times a day (QID) | ORAL | Status: DC | PRN

## 2021-04-28 MED ORDER — ATORVASTATIN CALCIUM 40 MG PO TABS: 40.0000 mg | ORAL_TABLET | Freq: Every day | ORAL | 3 refills | Status: DC

## 2021-04-28 MED ORDER — ASPIRIN 325 MG PO TBEC: 325.0000 mg | DELAYED_RELEASE_TABLET | Freq: Every day | ORAL | 3 refills | Status: DC

## 2021-04-28 MED ORDER — ATORVASTATIN CALCIUM 40 MG PO TABS
40.0000 mg | ORAL_TABLET | Freq: Every day | ORAL | Status: DC
  Administered 2021-04-28: 40 mg via ORAL
  Filled 2021-04-28: qty 1

## 2021-04-28 MED ORDER — ASPIRIN-ACETAMINOPHEN-CAFFEINE 250-250-65 MG PO TABS
1.0000 | ORAL_TABLET | Freq: Three times a day (TID) | ORAL | Status: DC | PRN
  Filled 2021-04-28: qty 1

## 2021-04-28 MED ORDER — PANTOPRAZOLE SODIUM 40 MG PO TBEC: 40.0000 mg | DELAYED_RELEASE_TABLET | Freq: Every day | ORAL | 2 refills | Status: DC

## 2021-04-28 NOTE — Progress Notes (Signed)
Echocardiogram 2D Echocardiogram has been performed.  Oneal Deputy Kekai Geter RDCS 04/28/2021, 11:26 AM

## 2021-04-28 NOTE — ED Notes (Signed)
Pt is ambulatory to the bathroom using a walker. Had no difficulty walking to the bathroom.

## 2021-04-28 NOTE — ED Notes (Signed)
Whole linen changed d/t it being soaked. Pt resting comfortably in bed and given half a cup of coffee. Daughter still at bedside.

## 2021-04-28 NOTE — Discharge Summary (Signed)
PATIENT DETAILS Name: Charles Golden Age: 87 y.o. Sex: male Date of Birth: 12-28-1925 MRN: [id number]. Admitting Physician: Marcelyn Bruins, MD TIW:[email or web address], Koleen Distance, DO  Admit Date: 04/27/2021 Discharge date: 04/28/2021  Recommendations for Outpatient Follow-up:  Follow up with PCP in 1-2 weeks Please obtain CMP/CBC in one week Please ensure follow up with the stroke clinic.  Admitted From:  Home  Disposition: [place]: No  Equipment/Devices: None  Discharge Condition: Stable  CODE STATUS: FULL CODE  Diet recommendation:  Diet Order             Diet Heart Room service appropriate? Yes; Fluid consistency: Thin  Diet effective now           Diet - low sodium heart healthy                    Brief Summary: Patient is a 87 y.o. male with history of BPH, COPD, CKD stage IIIb, who was at his wife's funeral yesterday when he developed aphasia/unsteady gait for approximately 10 minutes-he was brought to the ED and found to have acute CVA.  See below for further details  Stroke work-up: 12/1>> CT angio head/neck: Occlusion of left internal carotid artery 12/2>> MRI brain: Acute infarct in the left insula 12/2>> Echo: EF 50-53%, grade 2 diastolic dysfunction. 12/2>> LDL: 72 12/2>> A1c: 6.0   Brief Hospital Course: Acute CVA: Due to left internal carotid artery occlusion-work-up as above-discussed with stroke MD-no role for intervention in the occluded left ICA-recommendations are aspirin/Plavix for 3 months followed by aspirin alone.  Continue high intensity statin on discharge.  Please ensure follow-up at stroke clinic.  Thankfully patient does not have any focal deficits on exam-he is dysarthria/aphasia that he had on initial presentation has completely resolved.  Discussed with PT/OT-no recommendations for home health services.   COPD: Not in exacerbation-continue bronchodilators   BPH: Continue finasteride/Flomax   Stage IIIb: Close to  baseline-follow periodically.   BMI Estimated body mass index is 24.39 kg/m as calculated from the following:   Height as of this encounter: 5\' 10"  (1.778 m).   Weight as of this encounter: 77.1 kg.  Procedures None  Discharge Diagnoses:  Principal Problem:   TIA (transient ischemic attack) Active Problems:   BPH (benign prostatic hyperplasia)   COPD GOLD I    Discharge Instructions:  Activity:  As tolerated with Full fall precautions use walker/cane & assistance as needed Discharge Instructions     Ambulatory referral to Neurology   Complete by: As directed    An appointment is requested in approximately: 8 weeks   Call MD for:  extreme fatigue   Complete by: As directed    Call MD for:  persistant dizziness or light-headedness   Complete by: As directed    Diet - low sodium heart healthy   Complete by: As directed    Discharge instructions   Complete by: As directed    Follow with Primary MD  [name], DO in 1-2 weeks  Please get a complete blood count and chemistry panel checked by your Primary MD at your next visit, and again as instructed by your Primary MD.  Get Medicines reviewed and adjusted: Please take all your medications with you for your next visit with your Primary MD  Laboratory/radiological data: Please request your Primary MD to go over all hospital tests and procedure/radiological results at the follow up, please ask your Primary MD to get all [facility]  records sent to his/her office.  In some cases, they will be blood work, cultures and biopsy results pending at the time of your discharge. Please request that your primary care M.D. follows up on these results.  Also Note the following: If you experience worsening of your admission symptoms, develop shortness of breath, life threatening emergency, suicidal or homicidal thoughts you must seek medical attention immediately by calling 911 or calling your MD immediately  if symptoms less  severe.  You must read complete instructions/literature along with all the possible adverse reactions/side effects for all the Medicines you take and that have been prescribed to you. Take any new Medicines after you have completely understood and accpet all the possible adverse reactions/side effects.   Do not drive when taking Pain medications or sleeping medications (Benzodaizepines)  Do not take more than prescribed Pain, Sleep and Anxiety Medications. It is not advisable to combine anxiety,sleep and pain medications without talking with your primary care practitioner  Special Instructions: If you have smoked or chewed Tobacco  in the last 2 yrs please stop smoking, stop any regular Alcohol  and or any Recreational drug use.  Wear Seat belts while driving.  Please note: You were cared for by a hospitalist during your hospital stay. Once you are discharged, your primary care physician will handle any further medical issues. Please note that NO REFILLS for any discharge medications will be authorized once you are discharged, as it is imperative that you return to your primary care physician (or establish a relationship with a primary care physician if you do not have one) for your post hospital discharge needs so that they can reassess your need for medications and monitor your lab values.   1.  Aspirin along with Plavix x3 months-then stop Plavix and continue aspirin   Increase activity slowly   Complete by: As directed       Allergies as of 04/28/2021       Reactions   Demerol [meperidine] Other (See Comments)   Pt. States "he woke up during a colonoscopy"   Spiriva Handihaler [tiotropium Bromide Monohydrate] Other (See Comments)   Mild Urinary Retention        Medication List     STOP taking these medications    finasteride 5 MG tablet Commonly known as: PROSCAR       TAKE these medications    aspirin 325 MG EC tablet Take 1 tablet (325 mg total) by mouth  daily. Start taking on: April 29, 2021   atorvastatin 40 MG tablet Commonly known as: LIPITOR Take 1 tablet (40 mg total) by mouth daily. Start taking on: April 29, 2021   azelastine 0.1 % nasal spray Commonly known as: ASTELIN PLACE 1 SPRAY IN EACH NOSTRIL ONCE A DAY AS DIRECTED What changed:  how much to take when to take this additional instructions   Breo Ellipta 200-25 MCG/ACT Aepb Generic drug: fluticasone furoate-vilanterol Inhale 1 puff into the lungs daily.   clopidogrel 75 MG tablet Commonly known as: PLAVIX Take 1 tablet (75 mg total) by mouth daily. Start taking on: April 29, 2021   guaiFENesin 600 MG 12 hr tablet Commonly known as: MUCINEX Take 600 mg by mouth daily as needed for to loosen phlegm.   latanoprost 0.005 % ophthalmic solution Commonly known as: XALATAN Place 1 drop into the left eye at bedtime.   pantoprazole 40 MG tablet Commonly known as: Protonix Take 1 tablet (40 mg total) by mouth daily.   tamsulosin 0.4  MG Caps capsule Commonly known as: FLOMAX Take 0.8 mg by mouth daily.   timolol 0.5 % ophthalmic solution Commonly known as: TIMOPTIC Place 1 drop into the left eye every morning.   Ventolin HFA 108 (90 Base) MCG/ACT inhaler Generic drug: albuterol INHALE 2 PUFFS EVERY 6 HOURS AS NEEDED FOR WHEEZING OR SHORTNESS OF BREATH What changed: See the new instructions.   Vitamin B-12 5000 MCG Subl Place 1 tablet under the tongue daily.   Vitamin D3 50 MCG (2000 UT) Tabs Take 1 tablet by mouth daily.        Follow-up Information     Ronnie Doss M, DO. Schedule an appointment as soon as possible for a visit in 1 week(s).   Specialty: Family Medicine Contact information: [place] [address] [phone]         [facility] Follow up.   Why: Office will call with date/time, If you dont hear from them,please give them a call Contact information: [address]     [address] [place] [address] [phone]               Allergies  Allergen Reactions   Demerol [Meperidine] Other (See Comments)    Pt. States "he woke up during a colonoscopy"   Spiriva Handihaler [Tiotropium Bromide Monohydrate] Other (See Comments)    Mild Urinary Retention      Consultations:  neurology   Other Procedures/Studies: MR BRAIN WO CONTRAST  Result Date: 04/28/2021 CLINICAL DATA:  Acute neurologic deficit EXAM: MRI HEAD WITHOUT CONTRAST TECHNIQUE: Multiplanar, multiecho pulse sequences of the brain and surrounding structures were obtained without intravenous contrast. COMPARISON:  None. FINDINGS: Brain: Small focus of abnormal diffusion restriction in the left insula. No other diffusion abnormality. Focus of chronic microhemorrhage in the right cerebellum . Normal white matter signal. Generalized volume loss without a clear lobar predilection. The midline structures are normal. Vascular: Occluded left ICA.  The other flow voids are normal. Skull and upper cervical spine: Normal calvarium and skull base. Visualized upper cervical spine and soft tissues are normal. Sinuses/Orbits:No paranasal sinus fluid levels or advanced mucosal thickening. No mastoid or middle ear effusion. Normal orbits. IMPRESSION: 1. Small focus of acute ischemia in the left insula. No hemorrhage or mass effect. 2. Occluded left ICA. Electronically Signed   By: Ulyses Jarred M.D.   On: 04/28/2021 01:50   ECHOCARDIOGRAM COMPLETE  Result Date: 04/28/2021    ECHOCARDIOGRAM REPORT   Patient Name:   Charles Golden Date of Exam: 04/28/2021 Medical Rec #:  [id number]     Height:       70.0 in Accession #:    [id number]    Weight:       170.0 lb Date of Birth:  01-23-1926     BSA:          1.948 m Patient Age:    44 years      BP:           158/79 mmHg Patient Gender: M             HR:           63 bpm. Exam Location:  Inpatient Procedure: 2D Echo, Color Doppler and Cardiac Doppler Indications:     Stroke i63.9  History:        Patient has no prior history of Echocardiogram examinations.  COPD.  Sonographer:    Raquel Sarna Senior RDCS Referring Phys: [id number] Candace Gallus MELVIN  Sonographer Comments: Technically difficult due to poor echo windows, repositioned at end of study for wall motion. IMPRESSIONS  1. Technically difficult study with limited views. Left ventricular ejection fraction, by estimation, is 60 to 65%. The left ventricle has normal function. The left ventricle has no regional wall motion abnormalities. Left ventricular diastolic parameters are consistent with Grade II diastolic dysfunction (pseudonormalization). Elevated left atrial pressure.  2. Right ventricle is poorly visualized but appears grossly normal systolic function and mild to moderately dilated  3. Left atrial size was mildly dilated.  4. The mitral valve is normal in structure. No evidence of mitral valve regurgitation.  5. The aortic valve was not well visualized. Aortic valve regurgitation is not visualized. No aortic stenosis is present. FINDINGS  Left Ventricle: Left ventricular ejection fraction, by estimation, is 60 to 65%. The left ventricle has normal function. The left ventricle has no regional wall motion abnormalities. The left ventricular internal cavity size was normal in size. There is  no left ventricular hypertrophy. Left ventricular diastolic parameters are consistent with Grade II diastolic dysfunction (pseudonormalization). Elevated left atrial pressure. Right Ventricle: The right ventricular size is moderately enlarged. Right vetricular wall thickness was not well visualized. Right ventricular systolic function is normal. Left Atrium: Left atrial size was mildly dilated. Right Atrium: Right atrial size was normal in size. Pericardium: There is no evidence of pericardial effusion. Mitral Valve: The mitral valve is normal in structure. No evidence of mitral valve regurgitation. Tricuspid Valve: The  tricuspid valve is normal in structure. Tricuspid valve regurgitation is trivial. Aortic Valve: The aortic valve was not well visualized. Aortic valve regurgitation is not visualized. No aortic stenosis is present. Pulmonic Valve: The pulmonic valve was not well visualized. Pulmonic valve regurgitation is not visualized. Aorta: The aortic root is normal in size and structure. IAS/Shunts: The interatrial septum was not well visualized.  LEFT VENTRICLE PLAX 2D LVIDd:         4.20 cm   Diastology LVIDs:         2.70 cm   LV e' medial:    4.20 cm/s LV PW:         1.00 cm   LV E/e' medial:  13.5 LV IVS:        0.80 cm   LV e' lateral:   3.90 cm/s LVOT diam:     2.00 cm   LV E/e' lateral: 14.5 LV SV:         54 LV SV Index:   28 LVOT Area:     3.14 cm  RIGHT VENTRICLE RV S prime:     11.10 cm/s TAPSE (M-mode): 2.0 cm LEFT ATRIUM             Index        RIGHT ATRIUM           Index LA diam:        2.70 cm 1.39 cm/m   RA Area:     15.50 cm LA Vol (A2C):   69.9 ml 35.88 ml/m  RA Volume:   39.10 ml  20.07 ml/m LA Vol (A4C):   39.5 ml 20.28 ml/m LA Biplane Vol: 70.0 ml 35.93 ml/m  AORTIC VALVE LVOT Vmax:   66.10 cm/s LVOT Vmean:  49.600 cm/s LVOT VTI:    0.171 m  AORTA Ao Root diam: 3.30 cm MITRAL VALVE  TRICUSPID VALVE MV Area (PHT): 2.29 cm    TR Peak grad:   27.7 mmHg MV Decel Time: 331 msec    TR Vmax:        263.00 cm/s MV E velocity: 56.60 cm/s MV A velocity: 70.20 cm/s  SHUNTS MV E/A ratio:  0.81        Systemic VTI:  0.17 m                            Systemic Diam: 2.00 cm Oswaldo Milian MD Electronically signed by Oswaldo Milian MD Signature Date/Time: 04/28/2021/1:56:04 PM    Final    CT HEAD CODE STROKE WO CONTRAST  Result Date: 04/27/2021 CLINICAL DATA:  Code stroke.  Acute neuro deficit. EXAM: CT HEAD WITHOUT CONTRAST TECHNIQUE: Contiguous axial images were obtained from the base of the skull through the vertex without intravenous contrast. COMPARISON:  MRI head 05/14/2013.  CT  head 03/11/2009 FINDINGS: Brain: Generalized atrophy. Negative for hydrocephalus. Negative for acute infarct, hemorrhage, mass. Vascular: Hyperdensity of the left middle cerebral artery in the sylvian fissure. Probable left M2 branch occlusion. Correlate with symptoms. Skull: Negative Sinuses/Orbits: Mild mucosal edema right maxillary sinus. Bilateral cataract extraction Other: None ASPECTS ([place] Stroke Program Early CT Score) - Ganglionic level infarction (caudate, lentiform nuclei, internal capsule, insula, M1-M3 cortex): 7 - Supraganglionic infarction (M4-M6 cortex): 3 Total score (0-10 with 10 being normal): 10 IMPRESSION: 1. Negative for acute infarct or hemorrhage 2. Hyperdense left MCA branch in the sylvian fissure likely due to acute thrombosis. Correlate with symptoms. 3. ASPECTS is 10 4. Code stroke imaging results were communicated on 04/27/2021 at 6:39 pm to provider Lindzen via text page amion Electronically Signed   By: Franchot Gallo M.D.   On: 04/27/2021 18:46   CT ANGIO HEAD NECK W WO CM (CODE STROKE)  Result Date: 04/27/2021 CLINICAL DATA:  Acute neuro deficit. Speech abnormality which has cleared. EXAM: CT ANGIOGRAPHY HEAD AND NECK TECHNIQUE: Multidetector CT imaging of the head and neck was performed using the standard protocol during bolus administration of intravenous contrast. Multiplanar CT image reconstructions and MIPs were obtained to evaluate the vascular anatomy. Carotid stenosis measurements (when applicable) are obtained utilizing NASCET criteria, using the distal internal carotid diameter as the denominator. CONTRAST:  30mL OMNIPAQUE IOHEXOL 350 MG/ML SOLN COMPARISON:  CT head 04/27/2021 FINDINGS: CTA NECK FINDINGS Aortic arch: Atherosclerotic aortic arch and proximal great vessels. Proximal great vessels are patent. Right carotid system: Atherosclerotic calcification right carotid bifurcation without significant stenosis Left carotid system: Atherosclerotic disease at the left  carotid bifurcation. Occlusion of the proximal left internal carotid artery. Left external carotid artery patent. Vertebral arteries: Both vertebral arteries are patent to the skull base with mild stenosis proximally on the right. Skeleton: Cervical spondylosis.  No acute skeletal abnormality. Other neck: Negative for mass or adenopathy Upper chest: Lung apices clear bilaterally. Review of the MIP images confirms the above findings CTA HEAD FINDINGS Anterior circulation: Right cavernous carotid widely patent. Right anterior and right middle cerebral arteries widely patent. Left internal carotid artery is occluded with reconstitution of the supraclinoid segment. Left anterior cerebral artery patent. Left M1 segment patent. Occlusion of the proximal left M2 branch inferior division which appears acute. Posterior circulation: Both vertebral arteries patent to the basilar. PICA patent bilaterally. Basilar widely patent. Superior cerebellar and posterior cerebral arteries patent bilaterally. Venous sinuses: Normal venous enhancement Anatomic variants: None Review of the MIP images confirms the  above findings IMPRESSION: 1. Occlusion of the proximal left internal carotid artery of indeterminate age. However, this is likely acute given the acute occlusion of the inferior division of the left MCA M2 branch. This was hyperdense on CT consistent with acute thrombus. 2. No significant right carotid stenosis. No significant vertebral stenosis. 3. These results were called by telephone at the time of interpretation on 04/27/2021 at 7:24 pm to provider ERIC [facility] , who verbally acknowledged these results. Electronically Signed   By: Franchot Gallo M.D.   On: 04/27/2021 19:29     TODAY-DAY OF DISCHARGE:  Subjective:   Charles Golden today has no headache,no chest abdominal pain,no new weakness tingling or numbness, feels much better wants to go home today.   Objective:   Blood pressure (!) 152/81, pulse 61, temperature  97.9 F (36.6 C), resp. rate 15, height 5\' 10"  (1.778 m), weight 77.1 kg, SpO2 96 %.  Intake/Output Summary (Last 24 hours) at 04/28/2021 1501 Last data filed at 04/28/2021 0700 Gross per 24 hour  Intake 638.43 ml  Output 600 ml  Net 38.43 ml   Filed Weights   04/28/21 0829  Weight: 77.1 kg    Exam: Awake Alert, Oriented *3, No new F.N deficits, Normal affect [place].AT,PERRAL Supple Neck,No JVD, No cervical lymphadenopathy appriciated.  Symmetrical Chest wall movement, Good air movement bilaterally, CTAB RRR,No Gallops,Rubs or new Murmurs, No Parasternal Heave +ve B.Sounds, Abd Soft, Non tender, No organomegaly appriciated, No rebound -guarding or rigidity. No Cyanosis, Clubbing or edema, No new Rash or bruise   PERTINENT RADIOLOGIC STUDIES: MR BRAIN WO CONTRAST  Result Date: 04/28/2021 CLINICAL DATA:  Acute neurologic deficit EXAM: MRI HEAD WITHOUT CONTRAST TECHNIQUE: Multiplanar, multiecho pulse sequences of the brain and surrounding structures were obtained without intravenous contrast. COMPARISON:  None. FINDINGS: Brain: Small focus of abnormal diffusion restriction in the left insula. No other diffusion abnormality. Focus of chronic microhemorrhage in the right cerebellum . Normal white matter signal. Generalized volume loss without a clear lobar predilection. The midline structures are normal. Vascular: Occluded left ICA.  The other flow voids are normal. Skull and upper cervical spine: Normal calvarium and skull base. Visualized upper cervical spine and soft tissues are normal. Sinuses/Orbits:No paranasal sinus fluid levels or advanced mucosal thickening. No mastoid or middle ear effusion. Normal orbits. IMPRESSION: 1. Small focus of acute ischemia in the left insula. No hemorrhage or mass effect. 2. Occluded left ICA. Electronically Signed   By: Ulyses Jarred M.D.   On: 04/28/2021 01:50   ECHOCARDIOGRAM COMPLETE  Result Date: 04/28/2021    ECHOCARDIOGRAM REPORT   Patient Name:    Charles Golden Date of Exam: 04/28/2021 Medical Rec #:  [id number]     Height:       70.0 in Accession #:    [id number]    Weight:       170.0 lb Date of Birth:  03-07-1926     BSA:          1.948 m Patient Age:    59 years      BP:           158/79 mmHg Patient Gender: M             HR:           63 bpm. Exam Location:  Inpatient Procedure: 2D Echo, Color Doppler and Cardiac Doppler Indications:    Stroke i63.9  History:        Patient has no prior history  of Echocardiogram examinations.                 COPD.  Sonographer:    Raquel Sarna Senior RDCS Referring Phys: [id number] Candace Gallus MELVIN  Sonographer Comments: Technically difficult due to poor echo windows, repositioned at end of study for wall motion. IMPRESSIONS  1. Technically difficult study with limited views. Left ventricular ejection fraction, by estimation, is 60 to 65%. The left ventricle has normal function. The left ventricle has no regional wall motion abnormalities. Left ventricular diastolic parameters are consistent with Grade II diastolic dysfunction (pseudonormalization). Elevated left atrial pressure.  2. Right ventricle is poorly visualized but appears grossly normal systolic function and mild to moderately dilated  3. Left atrial size was mildly dilated.  4. The mitral valve is normal in structure. No evidence of mitral valve regurgitation.  5. The aortic valve was not well visualized. Aortic valve regurgitation is not visualized. No aortic stenosis is present. FINDINGS  Left Ventricle: Left ventricular ejection fraction, by estimation, is 60 to 65%. The left ventricle has normal function. The left ventricle has no regional wall motion abnormalities. The left ventricular internal cavity size was normal in size. There is  no left ventricular hypertrophy. Left ventricular diastolic parameters are consistent with Grade II diastolic dysfunction (pseudonormalization). Elevated left atrial pressure. Right Ventricle: The right ventricular size is moderately  enlarged. Right vetricular wall thickness was not well visualized. Right ventricular systolic function is normal. Left Atrium: Left atrial size was mildly dilated. Right Atrium: Right atrial size was normal in size. Pericardium: There is no evidence of pericardial effusion. Mitral Valve: The mitral valve is normal in structure. No evidence of mitral valve regurgitation. Tricuspid Valve: The tricuspid valve is normal in structure. Tricuspid valve regurgitation is trivial. Aortic Valve: The aortic valve was not well visualized. Aortic valve regurgitation is not visualized. No aortic stenosis is present. Pulmonic Valve: The pulmonic valve was not well visualized. Pulmonic valve regurgitation is not visualized. Aorta: The aortic root is normal in size and structure. IAS/Shunts: The interatrial septum was not well visualized.  LEFT VENTRICLE PLAX 2D LVIDd:         4.20 cm   Diastology LVIDs:         2.70 cm   LV e' medial:    4.20 cm/s LV PW:         1.00 cm   LV E/e' medial:  13.5 LV IVS:        0.80 cm   LV e' lateral:   3.90 cm/s LVOT diam:     2.00 cm   LV E/e' lateral: 14.5 LV SV:         54 LV SV Index:   28 LVOT Area:     3.14 cm  RIGHT VENTRICLE RV S prime:     11.10 cm/s TAPSE (M-mode): 2.0 cm LEFT ATRIUM             Index        RIGHT ATRIUM           Index LA diam:        2.70 cm 1.39 cm/m   RA Area:     15.50 cm LA Vol (A2C):   69.9 ml 35.88 ml/m  RA Volume:   39.10 ml  20.07 ml/m LA Vol (A4C):   39.5 ml 20.28 ml/m LA Biplane Vol: 70.0 ml 35.93 ml/m  AORTIC VALVE LVOT Vmax:   66.10 cm/s LVOT Vmean:  49.600 cm/s LVOT VTI:  0.171 m  AORTA Ao Root diam: 3.30 cm MITRAL VALVE               TRICUSPID VALVE MV Area (PHT): 2.29 cm    TR Peak grad:   27.7 mmHg MV Decel Time: 331 msec    TR Vmax:        263.00 cm/s MV E velocity: 56.60 cm/s MV A velocity: 70.20 cm/s  SHUNTS MV E/A ratio:  0.81        Systemic VTI:  0.17 m                            Systemic Diam: 2.00 cm Oswaldo Milian MD Electronically  signed by Oswaldo Milian MD Signature Date/Time: 04/28/2021/1:56:04 PM    Final    CT HEAD CODE STROKE WO CONTRAST  Result Date: 04/27/2021 CLINICAL DATA:  Code stroke.  Acute neuro deficit. EXAM: CT HEAD WITHOUT CONTRAST TECHNIQUE: Contiguous axial images were obtained from the base of the skull through the vertex without intravenous contrast. COMPARISON:  MRI head 05/14/2013.  CT head 03/11/2009 FINDINGS: Brain: Generalized atrophy. Negative for hydrocephalus. Negative for acute infarct, hemorrhage, mass. Vascular: Hyperdensity of the left middle cerebral artery in the sylvian fissure. Probable left M2 branch occlusion. Correlate with symptoms. Skull: Negative Sinuses/Orbits: Mild mucosal edema right maxillary sinus. Bilateral cataract extraction Other: None ASPECTS ([place] Stroke Program Early CT Score) - Ganglionic level infarction (caudate, lentiform nuclei, internal capsule, insula, M1-M3 cortex): 7 - Supraganglionic infarction (M4-M6 cortex): 3 Total score (0-10 with 10 being normal): 10 IMPRESSION: 1. Negative for acute infarct or hemorrhage 2. Hyperdense left MCA branch in the sylvian fissure likely due to acute thrombosis. Correlate with symptoms. 3. ASPECTS is 10 4. Code stroke imaging results were communicated on 04/27/2021 at 6:39 pm to provider Lindzen via text page amion Electronically Signed   By: Franchot Gallo M.D.   On: 04/27/2021 18:46   CT ANGIO HEAD NECK W WO CM (CODE STROKE)  Result Date: 04/27/2021 CLINICAL DATA:  Acute neuro deficit. Speech abnormality which has cleared. EXAM: CT ANGIOGRAPHY HEAD AND NECK TECHNIQUE: Multidetector CT imaging of the head and neck was performed using the standard protocol during bolus administration of intravenous contrast. Multiplanar CT image reconstructions and MIPs were obtained to evaluate the vascular anatomy. Carotid stenosis measurements (when applicable) are obtained utilizing NASCET criteria, using the distal internal carotid diameter as  the denominator. CONTRAST:  71mL OMNIPAQUE IOHEXOL 350 MG/ML SOLN COMPARISON:  CT head 04/27/2021 FINDINGS: CTA NECK FINDINGS Aortic arch: Atherosclerotic aortic arch and proximal great vessels. Proximal great vessels are patent. Right carotid system: Atherosclerotic calcification right carotid bifurcation without significant stenosis Left carotid system: Atherosclerotic disease at the left carotid bifurcation. Occlusion of the proximal left internal carotid artery. Left external carotid artery patent. Vertebral arteries: Both vertebral arteries are patent to the skull base with mild stenosis proximally on the right. Skeleton: Cervical spondylosis.  No acute skeletal abnormality. Other neck: Negative for mass or adenopathy Upper chest: Lung apices clear bilaterally. Review of the MIP images confirms the above findings CTA HEAD FINDINGS Anterior circulation: Right cavernous carotid widely patent. Right anterior and right middle cerebral arteries widely patent. Left internal carotid artery is occluded with reconstitution of the supraclinoid segment. Left anterior cerebral artery patent. Left M1 segment patent. Occlusion of the proximal left M2 branch inferior division which appears acute. Posterior circulation: Both vertebral arteries patent to the basilar. PICA patent bilaterally. Basilar  widely patent. Superior cerebellar and posterior cerebral arteries patent bilaterally. Venous sinuses: Normal venous enhancement Anatomic variants: None Review of the MIP images confirms the above findings IMPRESSION: 1. Occlusion of the proximal left internal carotid artery of indeterminate age. However, this is likely acute given the acute occlusion of the inferior division of the left MCA M2 branch. This was hyperdense on CT consistent with acute thrombus. 2. No significant right carotid stenosis. No significant vertebral stenosis. 3. These results were called by telephone at the time of interpretation on 04/27/2021 at 7:24 pm to  provider [facility] , who verbally acknowledged these results. Electronically Signed   By: Franchot Gallo M.D.   On: 04/27/2021 19:29     PERTINENT LAB RESULTS: CBC: Recent Labs    04/27/21 1826 04/27/21 1846  WBC 9.6  --   HGB 14.4 15.0  HCT 44.1 44.0  PLT 280  --    CMET CMP     Component Value Date/Time   NA 139 04/27/2021 1846   NA 142 12/13/2020 1429   K 4.6 04/27/2021 1846   CL 105 04/27/2021 1846   CO2 24 04/27/2021 1826   GLUCOSE 92 04/27/2021 1846   BUN 25 (H) 04/27/2021 1846   BUN 21 12/13/2020 1429   CREATININE 1.70 (H) 04/27/2021 1846   CREATININE 1.36 (H) 09/25/2012 1108   CALCIUM 8.8 (L) 04/27/2021 1826   PROT 6.0 (L) 04/27/2021 1826   PROT 6.3 09/04/2019 1511   ALBUMIN 3.5 04/27/2021 1826   ALBUMIN 4.1 12/13/2020 1429   AST 23 04/27/2021 1826   ALT 12 04/27/2021 1826   ALKPHOS 67 04/27/2021 1826   BILITOT 0.7 04/27/2021 1826   BILITOT 0.2 09/04/2019 1511   GFRNONAA 38 (L) 04/27/2021 1826   GFRNONAA 47 (L) 09/25/2012 1108   GFRAA 44 (L) 06/10/2020 1032   GFRAA 54 (L) 09/25/2012 1108    GFR Estimated Creatinine Clearance: 28 mL/min (A) (by C-G formula based on SCr of 1.7 mg/dL (H)). No results for input(s): LIPASE, AMYLASE in the last 72 hours. No results for input(s): CKTOTAL, CKMB, CKMBINDEX, TROPONINI in the last 72 hours. Invalid input(s): POCBNP No results for input(s): DDIMER in the last 72 hours. Recent Labs    04/28/21 0446  HGBA1C 6.0*   Recent Labs    04/28/21 0446  CHOL 125  HDL 45  LDLCALC 72  TRIG 39  CHOLHDL 2.8   No results for input(s): TSH, T4TOTAL, T3FREE, THYROIDAB in the last 72 hours.  Invalid input(s): FREET3 No results for input(s): VITAMINB12, FOLATE, FERRITIN, TIBC, IRON, RETICCTPCT in the last 72 hours. Coags: Recent Labs    04/27/21 1826  INR 1.1   Microbiology: No results found for this or any previous visit (from the past 240 hour(s)).  FURTHER DISCHARGE INSTRUCTIONS:  Get Medicines reviewed and  adjusted: Please take all your medications with you for your next visit with your Primary MD  Laboratory/radiological data: Please request your Primary MD to go over all hospital tests and procedure/radiological results at the follow up, please ask your Primary MD to get all Hospital records sent to his/her office.  In some cases, they will be blood work, cultures and biopsy results pending at the time of your discharge. Please request that your primary care M.D. goes through all the records of your hospital data and follows up on these results.  Also Note the following: If you experience worsening of your admission symptoms, develop shortness of breath, life threatening emergency, suicidal or homicidal  thoughts you must seek medical attention immediately by calling 911 or calling your MD immediately  if symptoms less severe.  You must read complete instructions/literature along with all the possible adverse reactions/side effects for all the Medicines you take and that have been prescribed to you. Take any new Medicines after you have completely understood and accpet all the possible adverse reactions/side effects.   Do not drive when taking Pain medications or sleeping medications (Benzodaizepines)  Do not take more than prescribed Pain, Sleep and Anxiety Medications. It is not advisable to combine anxiety,sleep and pain medications without talking with your primary care practitioner  Special Instructions: If you have smoked or chewed Tobacco  in the last 2 yrs please stop smoking, stop any regular Alcohol  and or any Recreational drug use.  Wear Seat belts while driving.  Please note: You were cared for by a hospitalist during your hospital stay. Once you are discharged, your primary care physician will handle any further medical issues. Please note that NO REFILLS for any discharge medications will be authorized once you are discharged, as it is imperative that you return to your primary  care physician (or establish a relationship with a primary care physician if you do not have one) for your post hospital discharge needs so that they can reassess your need for medications and monitor your lab values.  Total Time spent coordinating discharge including counseling, education and face to face time equals 35 minutes.  SignedOren Binet 04/28/2021 3:01 PM

## 2021-04-28 NOTE — Progress Notes (Addendum)
STROKE TEAM PROGRESS NOTE   ATTENDING NOTE: I reviewed above note and agree with the assessment and plan. Pt was seen and examined.   87 year old male with history of COPD admitted for episode of aphasia and difficulty walking for 10 minutes after his wife's funeral service.  CT no acute abnormality but left MCA hyperdense sign.  CTA head and neck showed left ICA occlusion and left M2 occlusion.  MRI showed left small insular cortex infarct but again confirmed left ICA occlusion.  EF 60 to 65%, LDL 72, A1c 6.0, creatinine 1.70.  On exam, patient daughter at bedside, patient neurology intact, no neuro deficit.  Etiology for patient stroke likely due to large vessel disease of left M2 occlusion.  His left ICA occlusion likely to be chronic given mild symptoms and small infarct.  Given his advanced age and intact neuro exam, no further neuro intervention needed at this time.  Recommend aspirin 325 and Plavix 75 DAPT for 3 months and then aspirin alone.  Added statin for stroke prevention.  Avoid low BP, BP goal 130-150 given left ICA occlusion.  PT/OT no recommendation, patient can be discharged from neuro standpoint.  We will follow-up with Dr. Jaynee Eagles on 06/22/21.   For detailed assessment and plan, please refer to above as I have made changes wherever appropriate.   Neurology will sign off. Please call with questions. Pt will follow up with Dr. Jaynee Eagles at [facility] on 06/22/21. Thanks for the consult.   Rosalin Hawking, MD PhD Stroke Neurology 04/28/2021 6:20 PM    SUBJECTIVE (INTERVAL HISTORY) His daughter is at the bedside.  Patient is resting comfortably in bed. He remains hemodynamically stable. Patient shows no neurological residual from his stroke. Extensive conversation about medications (aspirin, plavix) and plans for follow up with neurology after discharge. Given his symptoms resolving to baseline and he should take ASA and plavix for three months and then plavix alone and follow up with neurology  outpatient. All questions answered.    OBJECTIVE Vitals:   04/28/21 0500 04/28/21 0615 04/28/21 0827 04/28/21 0829  BP: 127/76 (!) 159/77 (!) 149/74   Pulse: (!) 56 74 60   Resp: 16 20 16    Temp:   97.9 F (36.6 C)   TempSrc:      SpO2: 96% 100% 99%   Weight:    77.1 kg  Height:    5\' 10"  (1.778 m)    CBC:  Recent Labs  Lab 04/27/21 1826 04/27/21 1846  WBC 9.6  --   NEUTROABS 6.1  --   HGB 14.4 15.0  HCT 44.1 44.0  MCV 95.9  --   PLT 280  --     Basic Metabolic Panel:  Recent Labs  Lab 04/27/21 1826 04/27/21 1846  NA 138 139  K 4.6 4.6  CL 107 105  CO2 24  --   GLUCOSE 94 92  BUN 23 25*  CREATININE 1.66* 1.70*  CALCIUM 8.8*  --     Lipid Panel:  Recent Labs  Lab 04/28/21 0446  CHOL 125  TRIG 39  HDL 45  CHOLHDL 2.8  VLDL 8  LDLCALC 72   HgbA1c:  Lab Results  Component Value Date   HGBA1C 6.0 (H) 04/28/2021   Urine Drug Screen: No results found for: LABOPIA, COCAINSCRNUR, LABBENZ, AMPHETMU, THCU, LABBARB  Alcohol Level No results found for: [facility]  IMAGING  Results for orders placed or performed during the hospital encounter of 04/27/21  MR BRAIN WO CONTRAST   Narrative  CLINICAL DATA:  Acute neurologic deficit  EXAM: MRI HEAD WITHOUT CONTRAST  TECHNIQUE: Multiplanar, multiecho pulse sequences of the brain and surrounding structures were obtained without intravenous contrast.  COMPARISON:  None.  FINDINGS: Brain: Small focus of abnormal diffusion restriction in the left insula. No other diffusion abnormality. Focus of chronic microhemorrhage in the right cerebellum . Normal white matter signal. Generalized volume loss without a clear lobar predilection. The midline structures are normal.  Vascular: Occluded left ICA.  The other flow voids are normal.  Skull and upper cervical spine: Normal calvarium and skull base. Visualized upper cervical spine and soft tissues are normal.  Sinuses/Orbits:No paranasal sinus fluid levels or  advanced mucosal thickening. No mastoid or middle ear effusion. Normal orbits.  IMPRESSION: 1. Small focus of acute ischemia in the left insula. No hemorrhage or mass effect. 2. Occluded left ICA.   Electronically Signed   By: Ulyses Jarred M.D.   On: 04/28/2021 01:50   Results for orders placed or performed during the hospital encounter of 03/11/09  CT Head Wo Contrast   Narrative   Clinical Data:  New onset of headaches.  Posterior nasal drainage. Rhinoplasty 20 years ago.   CT LIMITED SINUSES WITHOUT CONTRAST   Technique:  Multidetector CT images of the paranasal sinuses were obtained in a single plane without contrast.   Comparison: None.   Findings: Minimal inferior left maxillary sinus mucosal thickening with a maximum thickness of 1.9 mm.  This is within normal limits of physiological thickening.  1.6 cm retention cyst in the inferior right maxillary sinus.  The remainder of the paranasal sinuses are normally pneumatized without mucosal thickening or air-fluid levels.   IMPRESSION: No sinusitis.     Clinical Data:  New onset headaches.  Posterior nasal drainage.   CT HEAD WITHOUT CONTRAST   Technique: Contiguous axial images were obtained from the base of the skull through the vertex without intravenous contrast.   Comparison: None.   Findings:  Enlarged ventricles and subarachnoid spaces.  No intracranial hemorrhage, mass lesion or CT evidence of acute infarction.  Unremarkable bones and included paranasal sinuses.   IMPRESSION: Mild diffuse cerebral and cerebellar atrophy.  No acute abnormality.  Provider: Reesa Chew       PHYSICAL EXAM  Temp:  [97.1 F (36.2 C)-97.9 F (36.6 C)] 97.9 F (36.6 C) (12/02 0827) Pulse Rate:  [56-80] 60 (12/02 0827) Resp:  [14-25] 16 (12/02 0827) BP: (125-163)/(74-95) 149/74 (12/02 0827) SpO2:  [96 %-100 %] 99 % (12/02 0827) Weight:  [77.1 kg] 77.1 kg (12/02 0829)  General - Well nourished, well  developed, in no apparent distress.  Ophthalmologic - fundi not visualized due to noncooperation.  Cardiovascular - Regular rhythm and rate.  Mental Status -  Level of arousal and orientation to time, place, and person were intact. Language including expression, naming, repetition, comprehension was assessed and found intact. Attention span and concentration were normal. Recent and remote memory were intact. Fund of Knowledge was assessed and was intact.  Cranial Nerves II - XII - II - Visual field intact OU. III, IV, VI - Extraocular movements intact. V - Facial sensation intact bilaterally. VII - Facial movement intact bilaterally. VIII - Hearing & vestibular intact bilaterally. X - Palate elevates symmetrically. XI - Chin turning & shoulder shrug intact bilaterally. XII - Tongue protrusion intact.  Motor Strength - [name] strength was normal in all extremities and pronator drift was absent.  Bulk was normal and fasciculations were absent.  Motor Tone - Muscle tone was assessed at the neck and appendages and was normal.  Reflexes - [name] reflexes were symmetrical in all extremities and he had no pathological reflexes.  Sensory - Light touch, temperature/pinprick were assessed and were symmetrical.    Coordination - The patient had normal movements in the hands and feet with no ataxia or dysmetria.  Tremor was absent.  Gait and Station - deferred. PT/OT to see patient   ASSESSMENT/PLAN Mr. IASIAH OZMENT is a 87 y.o. male with history of BPH, COP, CKD stage IIIb presenting with aphasia and unsteady gait for approximately 10 minutes. He was at his wifes funeral 12/01 and this occurred in the car on his way home. His daughter states that a second episode did happen in the car that only lasted a couple of minutes. He was brought to the ED.   Stroke:  left insular cortex small infarct likely due to acute occlusion of the inferior division of left M2 and likely  chronic occlusion of left ICA CT head Stroke- Negative for acute infarct or hemorrhage. Hyperdense left MCA branch in the sylvian fissure likely due to acute thrombosis.  CTA Head and Neck- Occlusion of the proximal left internal carotid artery of indeterminate age. However, this is likely acute given the acute occlusion of the inferior division of the left MCA M2 branch.  MRI head - Occluded left ICA, small focus of acute ischemia in the left insula 2D Echo  60-65% EF, left ventricle has normal function LDL 72 HgbA1c 6.0 Lovenox for VTE prophylaxis No antithrombotic prior to admission, now on aspirin 325 mg daily and clopidogrel 75 mg daily. Plan for dual therapy for 3 months and then ASA alone.  Ongoing aggressive stroke risk factor management Therapy recommendations:  none Disposition:  home today  Hypertension Stable  Avoid low BP  Long-term BP goal 130-150 given left ICA occlusion  Hyperlipidemia Home meds:  None LDL 72, goal < 70 Add atorvastatin 40mg  Continue statin at discharge  Other Stroke Risk Factors Advanced age Migraines- Patient does report using Excedrin migraine for headaches  Other Active Problems COPD Continue home bronchodilator BPH Resume home medication- flomax CKD IIIb- at baseline function Monitor renal function   Hospital day # 0   Pt seen by NP/Neuro and later by MD. Note/plan to be edited by MD as needed.  Janine Ores, FNP-BC [facility]   To contact Stroke Continuity provider, please refer to [email or web address] After hours, contact General Neurology

## 2021-04-28 NOTE — ED Triage Notes (Signed)
Pt from home d/t slurred speech. According to patient, patient at wife's funeral when family noted patient presented with slurred speech. Upon arrival, patient symptoms mostly resolved. Patient stated that symptoms lasted about 10 minutes. Patient A&Ox4 at this time with GCS of 15. No notable deficits noted upon arrival.

## 2021-04-28 NOTE — Progress Notes (Addendum)
PROGRESS NOTE        PATIENT DETAILS Name: Charles Golden Age: 87 y.o. Sex: male Date of Birth: 06/13/1925 Admit Date: 04/27/2021 Admitting Physician Marcelyn Bruins, MD YQM:VHQIONGEXB, Koleen Distance, DO  Brief Narrative: Patient is a 87 y.o. male with history of BPH, COPD, CKD stage IIIb, who was at his wife's funeral yesterday when he developed aphasia/unsteady gait for approximately 10 minutes-he was brought to the ED and found to have acute CVA.  See below for further details.  Subjective: Lying comfortably in bed-denies any chest pain or shortness of breath.  Speech is back to the his baseline.  He is hard of hearing-and uses hearing aids.  Objective: Vitals: Blood pressure (!) 149/74, pulse 60, temperature 97.9 F (36.6 C), resp. rate 16, height 5\' 10"  (1.778 m), weight 77.1 kg, SpO2 99 %.   Exam: Gen Exam:Alert awake-not in any distress HEENT:atraumatic, normocephalic Chest: B/L clear to auscultation anteriorly CVS:S1S2 regular Abdomen:soft non tender, non distended Extremities:no edema Neurology: Non focal Skin: no rash  Pertinent Labs/Radiology: Recent Labs  Lab 04/27/21 1826 04/27/21 1846  WBC 9.6  --   HGB 14.4 15.0  PLT 280  --   NA 138 139  K 4.6 4.6  CREATININE 1.66* 1.70*  AST 23  --   ALT 12  --   ALKPHOS 67  --   BILITOT 0.7  --     Stroke work-up: 12/1>> CT angio head/neck: Occlusion of left internal carotid artery 12/2>> MRI brain: Acute infarct in the left insula 12/2>> LDL: 72 12/2>> A1c: 6.0  Assessment/Plan: Acute CVA: Due to left internal carotid artery occlusion-work-up as above-awaiting echo PT/OT-continue antiplatelet/statins-await further recommendations from stroke MD.  COPD: Not in exacerbation-continue bronchodilators  BPH: Continue finasteride/Flomax  Stage IIIb: Close to baseline-follow periodically.  BMI Estimated body mass index is 24.39 kg/m as calculated from the following:   Height as of this  encounter: 5\' 10"  (1.778 m).   Weight as of this encounter: 77.1 kg.    Procedures: None Consults: Neuro DVT Prophylaxis: Lovenox Code Status:Full code  Family Communication: None at bedside  Time spent: 35 minutes-Greater than 50% of this time was spent in counseling, explanation of diagnosis, planning of further management, and coordination of care.   Disposition Plan: Status is: Observation  The patient will require care spanning > 2 midnights and should be moved to inpatient because: Acute CVA-work-up in progress.   Diet: Diet Order             Diet NPO time specified  Diet effective now                     Antimicrobial agents: Anti-infectives (From admission, onward)    None        MEDICATIONS: Scheduled Meds:  aspirin EC  325 mg Oral Daily   enoxaparin (LOVENOX) injection  30 mg Subcutaneous Q24H   fluticasone furoate-vilanterol  1 puff Inhalation Daily   latanoprost  1 drop Left Eye QHS   tamsulosin  0.8 mg Oral Daily   timolol  1 drop Left Eye q morning   Continuous Infusions:  sodium chloride 75 mL/hr at 04/28/21 0831   PRN Meds:.acetaminophen **OR** acetaminophen (TYLENOL) oral liquid 160 mg/5 mL **OR** acetaminophen, albuterol, senna-docusate   I have personally reviewed following labs and imaging studies  LABORATORY DATA: CBC:  Recent Labs  Lab 04/27/21 1826 04/27/21 1846  WBC 9.6  --   NEUTROABS 6.1  --   HGB 14.4 15.0  HCT 44.1 44.0  MCV 95.9  --   PLT 280  --     Basic Metabolic Panel: Recent Labs  Lab 04/27/21 1826 04/27/21 1846  NA 138 139  K 4.6 4.6  CL 107 105  CO2 24  --   GLUCOSE 94 92  BUN 23 25*  CREATININE 1.66* 1.70*  CALCIUM 8.8*  --     GFR: Estimated Creatinine Clearance: 28 mL/min (A) (by C-G formula based on SCr of 1.7 mg/dL (H)).  Liver Function Tests: Recent Labs  Lab 04/27/21 1826  AST 23  ALT 12  ALKPHOS 67  BILITOT 0.7  PROT 6.0*  ALBUMIN 3.5   No results for input(s): LIPASE,  AMYLASE in the last 168 hours. No results for input(s): AMMONIA in the last 168 hours.  Coagulation Profile: Recent Labs  Lab 04/27/21 1826  INR 1.1    Cardiac Enzymes: No results for input(s): CKTOTAL, CKMB, CKMBINDEX, TROPONINI in the last 168 hours.  BNP (last 3 results) No results for input(s): PROBNP in the last 8760 hours.  Lipid Profile: Recent Labs    04/28/21 0446  CHOL 125  HDL 45  LDLCALC 72  TRIG 39  CHOLHDL 2.8    Thyroid Function Tests: No results for input(s): TSH, T4TOTAL, FREET4, T3FREE, THYROIDAB in the last 72 hours.  Anemia Panel: No results for input(s): VITAMINB12, FOLATE, FERRITIN, TIBC, IRON, RETICCTPCT in the last 72 hours.  Urine analysis:    Component Value Date/Time   COLORURINE YELLOW 09/04/2012 1334   APPEARANCEUR Clear 01/31/2016 1513   LABSPEC >1.030 (H) 09/04/2012 1334   PHURINE 5.5 09/04/2012 1334   GLUCOSEU Negative 01/31/2016 1513   HGBUR NEGATIVE 09/04/2012 1334   BILIRUBINUR Negative 01/31/2016 1513   KETONESUR NEGATIVE 09/04/2012 1334   PROTEINUR Negative 01/31/2016 1513   PROTEINUR NEGATIVE 09/04/2012 1334   UROBILINOGEN 0.2 09/04/2012 1334   NITRITE Negative 01/31/2016 1513   NITRITE NEGATIVE 09/04/2012 1334   LEUKOCYTESUR Negative 01/31/2016 1513    Sepsis Labs: Lactic Acid, Venous No results found for: LATICACIDVEN  MICROBIOLOGY: No results found for this or any previous visit (from the past 240 hour(s)).  RADIOLOGY STUDIES/RESULTS: MR BRAIN WO CONTRAST  Result Date: 04/28/2021 CLINICAL DATA:  Acute neurologic deficit EXAM: MRI HEAD WITHOUT CONTRAST TECHNIQUE: Multiplanar, multiecho pulse sequences of the brain and surrounding structures were obtained without intravenous contrast. COMPARISON:  None. FINDINGS: Brain: Small focus of abnormal diffusion restriction in the left insula. No other diffusion abnormality. Focus of chronic microhemorrhage in the right cerebellum . Normal white matter signal. Generalized  volume loss without a clear lobar predilection. The midline structures are normal. Vascular: Occluded left ICA.  The other flow voids are normal. Skull and upper cervical spine: Normal calvarium and skull base. Visualized upper cervical spine and soft tissues are normal. Sinuses/Orbits:No paranasal sinus fluid levels or advanced mucosal thickening. No mastoid or middle ear effusion. Normal orbits. IMPRESSION: 1. Small focus of acute ischemia in the left insula. No hemorrhage or mass effect. 2. Occluded left ICA. Electronically Signed   By: Ulyses Jarred M.D.   On: 04/28/2021 01:50   CT HEAD CODE STROKE WO CONTRAST  Result Date: 04/27/2021 CLINICAL DATA:  Code stroke.  Acute neuro deficit. EXAM: CT HEAD WITHOUT CONTRAST TECHNIQUE: Contiguous axial images were obtained from the base of the skull through the vertex without  intravenous contrast. COMPARISON:  MRI head 05/14/2013.  CT head 03/11/2009 FINDINGS: Brain: Generalized atrophy. Negative for hydrocephalus. Negative for acute infarct, hemorrhage, mass. Vascular: Hyperdensity of the left middle cerebral artery in the sylvian fissure. Probable left M2 branch occlusion. Correlate with symptoms. Skull: Negative Sinuses/Orbits: Mild mucosal edema right maxillary sinus. Bilateral cataract extraction Other: None ASPECTS ([place] Stroke Program Early CT Score) - Ganglionic level infarction (caudate, lentiform nuclei, internal capsule, insula, M1-M3 cortex): 7 - Supraganglionic infarction (M4-M6 cortex): 3 Total score (0-10 with 10 being normal): 10 IMPRESSION: 1. Negative for acute infarct or hemorrhage 2. Hyperdense left MCA branch in the sylvian fissure likely due to acute thrombosis. Correlate with symptoms. 3. ASPECTS is 10 4. Code stroke imaging results were communicated on 04/27/2021 at 6:39 pm to provider Lindzen via text page amion Electronically Signed   By: Franchot Gallo M.D.   On: 04/27/2021 18:46   CT ANGIO HEAD NECK W WO CM (CODE STROKE)  Result Date:  04/27/2021 CLINICAL DATA:  Acute neuro deficit. Speech abnormality which has cleared. EXAM: CT ANGIOGRAPHY HEAD AND NECK TECHNIQUE: Multidetector CT imaging of the head and neck was performed using the standard protocol during bolus administration of intravenous contrast. Multiplanar CT image reconstructions and MIPs were obtained to evaluate the vascular anatomy. Carotid stenosis measurements (when applicable) are obtained utilizing NASCET criteria, using the distal internal carotid diameter as the denominator. CONTRAST:  63mL OMNIPAQUE IOHEXOL 350 MG/ML SOLN COMPARISON:  CT head 04/27/2021 FINDINGS: CTA NECK FINDINGS Aortic arch: Atherosclerotic aortic arch and proximal great vessels. Proximal great vessels are patent. Right carotid system: Atherosclerotic calcification right carotid bifurcation without significant stenosis Left carotid system: Atherosclerotic disease at the left carotid bifurcation. Occlusion of the proximal left internal carotid artery. Left external carotid artery patent. Vertebral arteries: Both vertebral arteries are patent to the skull base with mild stenosis proximally on the right. Skeleton: Cervical spondylosis.  No acute skeletal abnormality. Other neck: Negative for mass or adenopathy Upper chest: Lung apices clear bilaterally. Review of the MIP images confirms the above findings CTA HEAD FINDINGS Anterior circulation: Right cavernous carotid widely patent. Right anterior and right middle cerebral arteries widely patent. Left internal carotid artery is occluded with reconstitution of the supraclinoid segment. Left anterior cerebral artery patent. Left M1 segment patent. Occlusion of the proximal left M2 branch inferior division which appears acute. Posterior circulation: Both vertebral arteries patent to the basilar. PICA patent bilaterally. Basilar widely patent. Superior cerebellar and posterior cerebral arteries patent bilaterally. Venous sinuses: Normal venous enhancement Anatomic  variants: None Review of the MIP images confirms the above findings IMPRESSION: 1. Occlusion of the proximal left internal carotid artery of indeterminate age. However, this is likely acute given the acute occlusion of the inferior division of the left MCA M2 branch. This was hyperdense on CT consistent with acute thrombus. 2. No significant right carotid stenosis. No significant vertebral stenosis. 3. These results were called by telephone at the time of interpretation on 04/27/2021 at 7:24 pm to provider ERIC [facility] , who verbally acknowledged these results. Electronically Signed   By: Franchot Gallo M.D.   On: 04/27/2021 19:29     LOS: 0 days   Oren Binet, MD  [facility]    To contact the attending provider between 7A-7P or the covering provider during after hours 7P-7A, please log into the web site [email or web address] and access using universal [place] password for that web site. If you do not have the password, please call the hospital  operator.  04/28/2021, 9:50 AM

## 2021-04-28 NOTE — Evaluation (Signed)
Physical Therapy Evaluation Patient Details Name: Charles Golden MRN: [id number] DOB: [birth year] Today's Date: 04/28/2021  History of Present Illness  Pt is a 87 year old man admitted 04/27/21 after episode of word finding deficits and difficulty with gait following his wife's funeral. MRI +acute ischemia L insula. Pt also with L ICA occlusion, L M2 occlusion. PMH: hearing loss, BPH, bronchiectasis, COPD, IBS, CKD, glaucoma, sciatic pain, glucagonoma.  Clinical Impression  Pt admitted secondary to problem above with deficits below. Pt requiring min A for steadying without AD, but improved steadiness noted with use of RW, only requiring min guard A. Anticipate pt will progress well. Reports he would like to wait to see how he does at home before committing to any PT follow up. Instructed to contact his MD if he decided he wanted PT follow up. Will continue to follow acutely.        Recommendations for follow up therapy are one component of a multi-disciplinary discharge planning process, led by the attending physician.  Recommendations may be updated based on patient status, additional functional criteria and insurance authorization.  Follow Up Recommendations No PT follow up (requesting to defer on PT until he sees how he does at home)    Assistance Recommended at Discharge Intermittent Supervision/Assistance  Functional Status Assessment Patient has had a recent decline in their functional status and demonstrates the ability to make significant improvements in function in a reasonable and predictable amount of time.  Equipment Recommendations  None recommended by PT    Recommendations for Other Services       Precautions / Restrictions Precautions Precautions: Fall Restrictions Weight Bearing Restrictions: No      Mobility  Bed Mobility Overal bed mobility: Needs Assistance Bed Mobility: Supine to Sit;Sit to Supine     Supine to sit: Supervision Sit to supine: Supervision    General bed mobility comments: HOB up, supervision for safety and lines    Transfers Overall transfer level: Needs assistance Equipment used: None Transfers: Sit to/from Stand Sit to Stand: Min assist           General transfer comment: steadying assist, pt reaching for support upon standing, choosing to use RW to ambulate in hall,    Ambulation/Gait Ambulation/Gait assistance: Min guard Gait Distance (Feet): 100 Feet Assistive device: Rolling walker (2 wheels) Gait Pattern/deviations: Step-through pattern;Decreased stride length Gait velocity: Decreased     General Gait Details: Overall steady with use of RW. Min guard for safety. Pt reports feeling a little more off balance than normal. Pt released RW at end of session and completed walk with min guard assist back to bed  Stairs            Wheelchair Mobility    Modified Rankin (Stroke Patients Only)       Balance Overall balance assessment: Needs assistance Sitting-balance support: No upper extremity supported Sitting balance-Leahy Scale: Good     Standing balance support: No upper extremity supported Standing balance-Leahy Scale: Fair Standing balance comment: fair static balance, needing support for dynamic                             Pertinent Vitals/Pain Pain Assessment: Faces Faces Pain Scale: Hurts a little bit Pain Location: head Pain Descriptors / Indicators: Aching Pain Intervention(s): Limited activity within patient's tolerance;Monitored during session;Repositioned    Home Living Family/patient expects to be discharged to:: Private residence Living Arrangements: Alone Available Help at Discharge: Family;Available  PRN/intermittently Type of Home: House Home Access: Stairs to enter Entrance Stairs-Rails: Right Entrance Stairs-Number of Steps: 2   Home Layout: One level Home Equipment: Conservation officer, nature (2 wheels);Shower seat;Grab bars - tub/shower;Grab bars -  toilet Additional Comments: home equipment was his deceased wife's    Prior Function Prior Level of Function : Independent/Modified Independent               ADLs Comments: drives, was caregiver of his wife     Hand Dominance   Dominant Hand: Right    Extremity/Trunk Assessment   Upper Extremity Assessment Upper Extremity Assessment: Defer to OT evaluation    Lower Extremity Assessment Lower Extremity Assessment: Generalized weakness    Cervical / Trunk Assessment Cervical / Trunk Assessment: Normal  Communication   Communication: HOH;Other (comment) (with hearing aids)  Cognition Arousal/Alertness: Awake/alert Behavior During Therapy: WFL for tasks assessed/performed Overall Cognitive Status: Within Functional Limits for tasks assessed                                          General Comments General comments (skin integrity, edema, etc.): Pt's daughter present    Exercises     Assessment/Plan    PT Assessment Patient needs continued PT services  PT Problem List Decreased strength;Decreased balance;Decreased mobility;Decreased knowledge of use of DME       PT Treatment Interventions DME instruction;Gait training;Stair training;Functional mobility training;Therapeutic activities;Therapeutic exercise;Balance training;Patient/family education    PT Goals (Current goals can be found in the Care Plan section)  Acute Rehab PT Goals Patient Stated Goal: to go home PT Goal Formulation: With patient Time For Goal Achievement: 05/12/21 Potential to Achieve Goals: Good    Frequency Min 4X/week   Barriers to discharge        Co-evaluation               AM-PAC PT "6 Clicks" Mobility  Outcome Measure Help needed turning from your back to your side while in a flat bed without using bedrails?: None Help needed moving from lying on your back to sitting on the side of a flat bed without using bedrails?: A Little Help needed moving to and  from a bed to a chair (including a wheelchair)?: A Little Help needed standing up from a chair using your arms (e.g., wheelchair or bedside chair)?: A Little Help needed to walk in hospital room?: A Little Help needed climbing 3-5 steps with a railing? : A Little 6 Click Score: 19    End of Session Equipment Utilized During Treatment: Gait belt Activity Tolerance: Patient tolerated treatment well Patient left: in bed;with call bell/phone within reach;with bed alarm set (on stretcher in ED) Nurse Communication: Mobility status PT Visit Diagnosis: Unsteadiness on feet (R26.81)    Time: 6767-2094 PT Time Calculation (min) (ACUTE ONLY): 22 min   Charges:   PT Evaluation $PT Eval Low Complexity: 1 Low          Lou Miner, DPT  Acute Rehabilitation Services  Pager: [phone] Office: [phone]   Rudean Hitt 04/28/2021, 3:26 PM

## 2021-04-28 NOTE — Evaluation (Signed)
Occupational Therapy Evaluation Patient Details Name: Charles Golden MRN: [id number] DOB: [birth year] Today's Date: 04/28/2021   History of Present Illness Pt is a 87 year old man admitted 04/27/21 after episode of word finding deficits and difficulty with gait following his wife's funeral. MRI +acute ischemia L insula. Pt also with L ICA occlusion, L M2 occlusion. PMH: hearing loss, BPH, bronchiectasis, COPD, IBS, CKD, glaucoma, sciatic pain, glucagonoma.   Clinical Impression   Pt is typically independent and was the caregiver of his recently deceased wife. He drives. Pt is a retired Chief Financial Officer. Pt presents with impaired standing balance requiring RW and min to min guard assist for OOB mobility and set up to min guard assist for ADL. Will follow pt acutely, but do not anticipate pt will need further OT upon discharge.      Recommendations for follow up therapy are one component of a multi-disciplinary discharge planning process, led by the attending physician.  Recommendations may be updated based on patient status, additional functional criteria and insurance authorization.   Follow Up Recommendations  No OT follow up    Assistance Recommended at Discharge    Functional Status Assessment  Patient has had a recent decline in their functional status and demonstrates the ability to make significant improvements in function in a reasonable and predictable amount of time.  Equipment Recommendations  None recommended by OT    Recommendations for Other Services       Precautions / Restrictions Precautions Precautions: Fall Restrictions Weight Bearing Restrictions: No      Mobility Bed Mobility Overal bed mobility: Needs Assistance Bed Mobility: Supine to Sit;Sit to Supine     Supine to sit: Supervision Sit to supine: Supervision   General bed mobility comments: HOB up, supervision for safety and lines    Transfers Overall transfer level: Needs assistance Equipment used:  None Transfers: Sit to/from Stand Sit to Stand: Min assist           General transfer comment: steadying assist, pt reaching for support upon standing, choosing to use RW to ambulate in hall, released RW at end of session and completed walk with min guard assist back to bed      Balance Overall balance assessment: Needs assistance   Sitting balance-Leahy Scale: Good     Standing balance support: No upper extremity supported Standing balance-Leahy Scale: Fair Standing balance comment: fair static balance, needing support for dynamic                           ADL either performed or assessed with clinical judgement   ADL Overall ADL's : Needs assistance/impaired Eating/Feeding: Independent;Bed level   Grooming: Min guard;Standing   Upper Body Bathing: Set up;Sitting   Lower Body Bathing: Min guard;Sit to/from stand   Upper Body Dressing : Set up;Sitting   Lower Body Dressing: Min guard;Sit to/from stand   Toilet Transfer: Min guard;Ambulation;Rolling walker (2 wheels)   Toileting- Clothing Manipulation and Hygiene: Min guard;Sit to/from stand       Functional mobility during ADLs: Min guard;Rolling walker (2 wheels)       Vision Baseline Vision/History: 1 Wears glasses Ability to See in Adequate Light: 0 Adequate Patient Visual Report: No change from baseline       Perception     Praxis      Pertinent Vitals/Pain Pain Assessment: Faces Faces Pain Scale: Hurts a little bit Pain Location: head Pain Descriptors / Indicators: Aching Pain Intervention(s): Monitored  during session     Hand Dominance Right   Extremity/Trunk Assessment Upper Extremity Assessment Upper Extremity Assessment: Overall WFL for tasks assessed   Lower Extremity Assessment Lower Extremity Assessment: Defer to PT evaluation       Communication Communication Communication: HOH;Other (comment) (with hearing aids)   Cognition Arousal/Alertness: Awake/alert Behavior  During Therapy: WFL for tasks assessed/performed Overall Cognitive Status: Within Functional Limits for tasks assessed                                       General Comments       Exercises     Shoulder Instructions      Home Living Family/patient expects to be discharged to:: Private residence Living Arrangements: Alone Available Help at Discharge: Family;Available PRN/intermittently Type of Home: House Home Access: Stairs to enter [employer] of Steps: 2 Entrance Stairs-Rails: Right Home Layout: One level     Bathroom Shower/Tub: Occupational psychologist: Handicapped height     Home Equipment: Conservation officer, nature (2 wheels);Shower seat;Grab bars - tub/shower;Grab bars - toilet   Additional Comments: home equipment was his deceased wife's      Prior Functioning/Environment Prior Level of Function : Independent/Modified Independent               ADLs Comments: drives, was caregiver of his wife        OT Problem List: Impaired balance (sitting and/or standing)      OT Treatment/Interventions: Self-care/ADL training;DME and/or AE instruction;Patient/family education;Balance training;Therapeutic activities    OT Goals(Current goals can be found in the care plan section) Acute Rehab OT Goals OT Goal Formulation: With patient Time For Goal Achievement: 05/12/21 Potential to Achieve Goals: Good ADL Goals Pt Will Perform Grooming: with modified independence;standing Pt Will Perform Lower Body Bathing: with modified independence;sit to/from stand Pt Will Perform Lower Body Dressing: with modified independence;sit to/from stand Pt Will Transfer to Toilet: with modified independence;ambulating Pt Will Perform Toileting - Clothing Manipulation and hygiene: with modified independence;sit to/from stand Pt Will Perform Tub/Shower Transfer: Shower transfer;with modified independence;ambulating  OT Frequency: Min 2X/week   Barriers to  D/C:            Co-evaluation              AM-PAC OT "6 Clicks" Daily Activity     Outcome Measure Help from another person eating meals?: None Help from another person taking care of personal grooming?: A Little Help from another person toileting, which includes using toliet, bedpan, or urinal?: A Little Help from another person bathing (including washing, rinsing, drying)?: A Little Help from another person to put on and taking off regular upper body clothing?: None Help from another person to put on and taking off regular lower body clothing?: A Little 6 Click Score: 20   End of Session Equipment Utilized During Treatment: Rolling walker (2 wheels);Gait belt  Activity Tolerance: Patient tolerated treatment well Patient left: in bed;with call bell/phone within reach;with family/visitor present  OT Visit Diagnosis: Unsteadiness on feet (R26.81);Other abnormalities of gait and mobility (R26.89)                Time: 6440-3474 OT Time Calculation (min): 22 min Charges:  OT General Charges $OT Visit: 1 Visit OT Evaluation $OT Eval Low Complexity: 1 Low Nestor Lewandowsky, OTR/L Acute Rehabilitation Services Pager: [phone] Office: [phone]  Malka So 04/28/2021, 12:27 PM

## 2021-04-29 ENCOUNTER — Emergency Department (HOSPITAL_COMMUNITY): Payer: Medicare Other

## 2021-04-29 ENCOUNTER — Inpatient Hospital Stay (HOSPITAL_COMMUNITY): Payer: Medicare Other

## 2021-04-29 ENCOUNTER — Encounter (HOSPITAL_COMMUNITY): Payer: Self-pay

## 2021-04-29 ENCOUNTER — Inpatient Hospital Stay (HOSPITAL_COMMUNITY)
Admission: EM | Admit: 2021-04-29 | Discharge: 2021-05-01 | DRG: 064 | Disposition: A | Discharge status: Home / Self Care | Payer: Medicare Other | Attending: Internal Medicine | Admitting: Internal Medicine

## 2021-04-29 ENCOUNTER — Other Ambulatory Visit: Payer: Self-pay

## 2021-04-29 DIAGNOSIS — H9190 Unspecified hearing loss, unspecified ear: Secondary | ICD-10-CM | POA: Diagnosis present

## 2021-04-29 DIAGNOSIS — R471 Dysarthria and anarthria: Secondary | ICD-10-CM | POA: Diagnosis present

## 2021-04-29 DIAGNOSIS — I6522 Occlusion and stenosis of left carotid artery: Secondary | ICD-10-CM | POA: Diagnosis not present

## 2021-04-29 DIAGNOSIS — E785 Hyperlipidemia, unspecified: Secondary | ICD-10-CM | POA: Diagnosis not present

## 2021-04-29 DIAGNOSIS — K219 Gastro-esophageal reflux disease without esophagitis: Secondary | ICD-10-CM | POA: Diagnosis not present

## 2021-04-29 DIAGNOSIS — R29701 NIHSS score 1: Secondary | ICD-10-CM | POA: Diagnosis not present

## 2021-04-29 DIAGNOSIS — J449 Chronic obstructive pulmonary disease, unspecified: Secondary | ICD-10-CM | POA: Diagnosis not present

## 2021-04-29 DIAGNOSIS — R4701 Aphasia: Secondary | ICD-10-CM | POA: Diagnosis not present

## 2021-04-29 DIAGNOSIS — I639 Cerebral infarction, unspecified: Secondary | ICD-10-CM | POA: Diagnosis not present

## 2021-04-29 DIAGNOSIS — I63232 Cerebral infarction due to unspecified occlusion or stenosis of left carotid arteries: Secondary | ICD-10-CM | POA: Diagnosis not present

## 2021-04-29 DIAGNOSIS — Z974 Presence of external hearing-aid: Secondary | ICD-10-CM

## 2021-04-29 DIAGNOSIS — R5381 Other malaise: Secondary | ICD-10-CM | POA: Diagnosis not present

## 2021-04-29 DIAGNOSIS — I63512 Cerebral infarction due to unspecified occlusion or stenosis of left middle cerebral artery: Principal | ICD-10-CM | POA: Diagnosis present

## 2021-04-29 DIAGNOSIS — N401 Enlarged prostate with lower urinary tract symptoms: Secondary | ICD-10-CM | POA: Diagnosis not present

## 2021-04-29 DIAGNOSIS — R29818 Other symptoms and signs involving the nervous system: Secondary | ICD-10-CM | POA: Diagnosis not present

## 2021-04-29 DIAGNOSIS — N1832 Chronic kidney disease, stage 3b: Secondary | ICD-10-CM | POA: Diagnosis present

## 2021-04-29 DIAGNOSIS — R110 Nausea: Secondary | ICD-10-CM | POA: Diagnosis not present

## 2021-04-29 DIAGNOSIS — Z8249 Family history of ischemic heart disease and other diseases of the circulatory system: Secondary | ICD-10-CM | POA: Diagnosis not present

## 2021-04-29 DIAGNOSIS — E43 Unspecified severe protein-calorie malnutrition: Secondary | ICD-10-CM | POA: Diagnosis not present

## 2021-04-29 DIAGNOSIS — R351 Nocturia: Secondary | ICD-10-CM | POA: Diagnosis not present

## 2021-04-29 DIAGNOSIS — R262 Difficulty in walking, not elsewhere classified: Secondary | ICD-10-CM | POA: Diagnosis not present

## 2021-04-29 DIAGNOSIS — R0689 Other abnormalities of breathing: Secondary | ICD-10-CM | POA: Diagnosis not present

## 2021-04-29 DIAGNOSIS — Z800 Family history of malignant neoplasm of digestive organs: Secondary | ICD-10-CM | POA: Diagnosis not present

## 2021-04-29 DIAGNOSIS — Z79899 Other long term (current) drug therapy: Secondary | ICD-10-CM

## 2021-04-29 DIAGNOSIS — Z888 Allergy status to other drugs, medicaments and biological substances status: Secondary | ICD-10-CM | POA: Diagnosis not present

## 2021-04-29 DIAGNOSIS — I129 Hypertensive chronic kidney disease with stage 1 through stage 4 chronic kidney disease, or unspecified chronic kidney disease: Secondary | ICD-10-CM | POA: Diagnosis present

## 2021-04-29 DIAGNOSIS — Z87891 Personal history of nicotine dependence: Secondary | ICD-10-CM

## 2021-04-29 DIAGNOSIS — Z7951 Long term (current) use of inhaled steroids: Secondary | ICD-10-CM | POA: Diagnosis not present

## 2021-04-29 DIAGNOSIS — Z885 Allergy status to narcotic agent status: Secondary | ICD-10-CM

## 2021-04-29 DIAGNOSIS — N400 Enlarged prostate without lower urinary tract symptoms: Secondary | ICD-10-CM | POA: Diagnosis present

## 2021-04-29 DIAGNOSIS — Z20822 Contact with and (suspected) exposure to covid-19: Secondary | ICD-10-CM | POA: Diagnosis not present

## 2021-04-29 DIAGNOSIS — G2581 Restless legs syndrome: Secondary | ICD-10-CM | POA: Diagnosis not present

## 2021-04-29 DIAGNOSIS — I6300 Cerebral infarction due to thrombosis of unspecified precerebral artery: Secondary | ICD-10-CM | POA: Diagnosis not present

## 2021-04-29 DIAGNOSIS — G459 Transient cerebral ischemic attack, unspecified: Secondary | ICD-10-CM | POA: Diagnosis not present

## 2021-04-29 DIAGNOSIS — R4781 Slurred speech: Secondary | ICD-10-CM | POA: Diagnosis not present

## 2021-04-29 DIAGNOSIS — I63412 Cerebral infarction due to embolism of left middle cerebral artery: Secondary | ICD-10-CM | POA: Diagnosis not present

## 2021-04-29 DIAGNOSIS — I63032 Cerebral infarction due to thrombosis of left carotid artery: Secondary | ICD-10-CM | POA: Diagnosis present

## 2021-04-29 DIAGNOSIS — G936 Cerebral edema: Secondary | ICD-10-CM | POA: Diagnosis not present

## 2021-04-29 DIAGNOSIS — E1122 Type 2 diabetes mellitus with diabetic chronic kidney disease: Secondary | ICD-10-CM | POA: Diagnosis present

## 2021-04-29 DIAGNOSIS — Z825 Family history of asthma and other chronic lower respiratory diseases: Secondary | ICD-10-CM | POA: Diagnosis not present

## 2021-04-29 HISTORY — DX: Cerebral infarction, unspecified: I63.9

## 2021-04-29 LAB — DIFFERENTIAL
Abs Immature Granulocytes: 0.02 10*3/uL (ref 0.00–0.07)
Basophils Absolute: 0 10*3/uL (ref 0.0–0.1)
Basophils Relative: 0 %
Eosinophils Absolute: 0.1 10*3/uL (ref 0.0–0.5)
Eosinophils Relative: 2 %
Immature Granulocytes: 0 %
Lymphocytes Relative: 20 %
Lymphs Abs: 1.5 10*3/uL (ref 0.7–4.0)
Monocytes Absolute: 0.7 10*3/uL (ref 0.1–1.0)
Monocytes Relative: 9 %
Neutro Abs: 5.3 10*3/uL (ref 1.7–7.7)
Neutrophils Relative %: 69 %

## 2021-04-29 LAB — COMPREHENSIVE METABOLIC PANEL
ALT: 14 U/L (ref 0–44)
AST: 25 U/L (ref 15–41)
Albumin: 3.6 g/dL (ref 3.5–5.0)
Alkaline Phosphatase: 75 U/L (ref 38–126)
Anion gap: 8 (ref 5–15)
BUN: 26 mg/dL — ABNORMAL HIGH (ref 8–23)
CO2: 24 mmol/L (ref 22–32)
Calcium: 8.7 mg/dL — ABNORMAL LOW (ref 8.9–10.3)
Chloride: 106 mmol/L (ref 98–111)
Creatinine, Ser: 1.58 mg/dL — ABNORMAL HIGH (ref 0.61–1.24)
GFR, Estimated: 41 mL/min — ABNORMAL LOW (ref >60)
Glucose, Bld: 103 mg/dL — ABNORMAL HIGH (ref 70–99)
Potassium: 4.5 mmol/L (ref 3.5–5.1)
Sodium: 138 mmol/L (ref 135–145)
Total Bilirubin: 0.9 mg/dL (ref 0.3–1.2)
Total Protein: 6.4 g/dL — ABNORMAL LOW (ref 6.5–8.1)

## 2021-04-29 LAB — CBC
HCT: 44.4 % (ref 39.0–52.0)
Hemoglobin: 14.4 g/dL (ref 13.0–17.0)
MCH: 31.4 pg (ref 26.0–34.0)
MCHC: 32.4 g/dL (ref 30.0–36.0)
MCV: 96.7 fL (ref 80.0–100.0)
Platelets: 256 10*3/uL (ref 150–400)
RBC: 4.59 MIL/uL (ref 4.22–5.81)
RDW: 13 % (ref 11.5–15.5)
WBC: 7.7 10*3/uL (ref 4.0–10.5)
nRBC: 0 % (ref 0.0–0.2)

## 2021-04-29 LAB — APTT: aPTT: 27 seconds (ref 24–36)

## 2021-04-29 LAB — RAPID URINE DRUG SCREEN, HOSP PERFORMED
Amphetamines: NOT DETECTED
Barbiturates: NOT DETECTED
Benzodiazepines: NOT DETECTED
Cocaine: NOT DETECTED
Opiates: NOT DETECTED
Tetrahydrocannabinol: NOT DETECTED

## 2021-04-29 LAB — URINALYSIS, ROUTINE W REFLEX MICROSCOPIC
Bilirubin Urine: NEGATIVE
Glucose, UA: NEGATIVE mg/dL
Hgb urine dipstick: NEGATIVE
Ketones, ur: 15 mg/dL — AB
Leukocytes,Ua: NEGATIVE
Nitrite: NEGATIVE
Protein, ur: NEGATIVE mg/dL
Specific Gravity, Urine: 1.01 (ref 1.005–1.030)
pH: 5.5 (ref 5.0–8.0)

## 2021-04-29 LAB — PROTIME-INR
INR: 1.1 (ref 0.8–1.2)
Prothrombin Time: 13.9 seconds (ref 11.4–15.2)

## 2021-04-29 LAB — ETHANOL: Alcohol, Ethyl (B): <10 mg/dL (ref <10)

## 2021-04-29 LAB — RESP PANEL BY RT-PCR (FLU A&B, COVID) ARPGX2
Influenza A by PCR: NEGATIVE
Influenza B by PCR: NEGATIVE
SARS Coronavirus 2 by RT PCR: NEGATIVE

## 2021-04-29 MED ORDER — STROKE: EARLY STAGES OF RECOVERY BOOK
Freq: Once | Status: DC
  Filled 2021-04-29: qty 1

## 2021-04-29 MED ORDER — IOHEXOL 350 MG/ML SOLN
100.0000 mL | Freq: Once | INTRAVENOUS | Status: AC | PRN
  Administered 2021-04-29: 100 mL via INTRAVENOUS

## 2021-04-29 MED ORDER — ATORVASTATIN CALCIUM 40 MG PO TABS
40.0000 mg | ORAL_TABLET | Freq: Every day | ORAL | Status: DC
Start: 2021-04-29 — End: 2021-05-01
  Administered 2021-04-29 – 2021-04-30 (×2): 40 mg via ORAL
  Filled 2021-04-29 (×2): qty 1

## 2021-04-29 MED ORDER — ACETAMINOPHEN 650 MG RE SUPP: 650.0000 mg | RECTAL | Status: DC | PRN

## 2021-04-29 MED ORDER — ACETAMINOPHEN 160 MG/5ML PO SOLN: 650.0000 mg | ORAL | Status: DC | PRN

## 2021-04-29 MED ORDER — SODIUM CHLORIDE 0.9 % IV SOLN: INTRAVENOUS | Status: DC

## 2021-04-29 MED ORDER — SENNOSIDES-DOCUSATE SODIUM 8.6-50 MG PO TABS: 1.0000 | ORAL_TABLET | Freq: Every evening | ORAL | Status: DC | PRN

## 2021-04-29 MED ORDER — CLOPIDOGREL BISULFATE 75 MG PO TABS
75.0000 mg | ORAL_TABLET | Freq: Every day | ORAL | Status: DC
Start: 2021-04-29 — End: 2021-05-01
  Administered 2021-04-29 – 2021-05-01 (×3): 75 mg via ORAL
  Filled 2021-04-29 (×3): qty 1

## 2021-04-29 MED ORDER — ACETAMINOPHEN 325 MG PO TABS
650.0000 mg | ORAL_TABLET | ORAL | Status: DC | PRN
  Administered 2021-04-29: 650 mg via ORAL
  Filled 2021-04-29: qty 2

## 2021-04-29 MED ORDER — ENOXAPARIN SODIUM 40 MG/0.4ML IJ SOSY
40.0000 mg | PREFILLED_SYRINGE | Freq: Every day | INTRAMUSCULAR | Status: DC
  Administered 2021-04-29 – 2021-05-01 (×3): 40 mg via SUBCUTANEOUS
  Filled 2021-04-29 (×3): qty 0.4

## 2021-04-29 MED ORDER — ASPIRIN EC 81 MG PO TBEC
81.0000 mg | DELAYED_RELEASE_TABLET | Freq: Every day | ORAL | Status: DC
  Administered 2021-04-29: 81 mg via ORAL
  Filled 2021-04-29: qty 1

## 2021-04-29 MED ORDER — PANTOPRAZOLE SODIUM 40 MG PO TBEC
40.0000 mg | DELAYED_RELEASE_TABLET | Freq: Every day | ORAL | Status: DC
  Administered 2021-04-29 – 2021-05-01 (×3): 40 mg via ORAL
  Filled 2021-04-29 (×3): qty 1

## 2021-04-29 NOTE — H&P (Signed)
History and Physical  Charles Golden KZS:[id number] DOB: [birth year] DOA: 04/29/2021   PCP: [name], DO   Patient coming from: Home  Chief Complaint: word finding difficulty  HPI:  Charles Golden is a 87 y.o. male with medical history of acute ischemic stroke, BPH, COPD, CKD 3, GERD presenting with word finding difficulties.  Notably, the patient was recently admitted to the hospital from 04/27/2021 to 04/28/21 secondary to ischemic stroke.  MRI at that time showed an ischemic focus in the left insular region.  CTA of the head and neck on 04/27/2021 showed an occlusion of the left proximal ICA.  Neurology followed the patient and did not recommend any further intervention regarding the patient's left internal carotid.  The patient was sent home with aspirin and Plavix for 3 months, then aspirin monotherapy thereafter.  The patient stated that he was feeling his usual self except for some generalized weakness after he got home.  He did not have any word finding difficulties at the time.  He went to bed around 9 PM.  He woke up around 7 AM and his daughter noted that he was " stumbling" when he was walking to the bathroom.  She went to check up on him and noticed that he was having word finding difficulties.  EMS was activated and the patient was brought to [facility] [place].  Patient himself denied any fevers, chills, chest pain, shortness breath, palpitations, nausea, vomiting or diarrhea.  Patient does have bifrontal headache.  He states that he has chronic headaches for which she takes Excedrin Migraine at least 3 times per week.  Upon EMS arrival, they noted his systolic blood pressure to be 115.  ED Patient was afebrile hemodynamically stable with oxygen saturation 99% on room air.  BMP showed sodium 138, potassium 4.5, bicarbonate 24, serum creatinine 1.58.  LFTs were unremarkable.  WBC 7.7, hemoglobin 14.4, platelets 256,000.  CT of the brain was negative for any acute findings.  CTA  head and neck showed internal partial recannulization of the cervical left ICA.  There remained a focal near occlusion with submillimeter lumen of the left ICA.  The intracranial left ICA is now patent.  Teleneurology saw the patient and recommended transfer to [facility] for observation and repeat MRI of the brain.  Assessment/Plan: Acute ischemic stroke -Patient had full neurologic work-up/stroke work-up during his last admission -04/28/21 Echo--EF 60 to 65%, no WMA, grade 2 DD, IAS not visualized -04/29/21 CTA H&N--Interval partial recanalization of the cervical left ICA. Remains focal near occlusion with submillimeter lumen and decreased distal vessel caliber reflecting flow limitation. Intracranially, the left ICA is now patent. There is a persistent left M2 MCA occlusion -12/2 LDL 72 -12/2 A1C--6.0 -Personally reviewed EKG--sinus rhythm, nonspecific T wave change -Continue aspirin and Plavix -Continue Lipitor  BPH -Holding tamsulosin per neurology recommendation  CKD stage IIIb -Baseline creatinine 1.5-1.7  Impaired glucose tolerance -Hemoglobin A1c 6.0 as discussed above  COPD/bronchiectasis -Stable on room air  GERD -Continue pantoprazole     Past Medical History:  Diagnosis Date   Bronchitis, chronic (HCC)    COPD (chronic obstructive pulmonary disease) (HCC)    Enlarged prostate    GERD (gastroesophageal reflux disease)    Glaucoma    Glucagonoma    Hernia, incisional    at present   [facility] (hard of hearing)    IBS (irritable bowel syndrome)    Sciatic pain    Sinus congestion  Past Surgical History:  Procedure Laterality Date   BOWEL RESECTION N/A 09/04/2012   Procedure: SMALL BOWEL RESECTION;  Surgeon: Donato Heinz, MD;  Location: [facility];  Service: General;  Laterality: N/A;  Anastimosis   HEMORRHOID SURGERY     HERNIA REPAIR Bilateral 70's   INGUINAL HERNIA REPAIR Left 09/14/2013   Procedure: LEFT INGUINAL HERNIORRHAPHY;  Surgeon: Jamesetta So, MD;   Location: [facility];  Service: General;  Laterality: Left;   INGUINAL HERNIA REPAIR Right 12/23/2018   Procedure: RECURRENT RIGHT INGUINAL HERNIA  REPAIR  WITH MESH;  Surgeon: Aviva Signs, MD;  Location: [facility];  Service: General;  Laterality: Right;   INSERTION OF MESH Left 09/14/2013   Procedure: INSERTION OF MESH;  Surgeon: Jamesetta So, MD;  Location: [facility];  Service: General;  Laterality: Left;   KNEE ARTHROSCOPY Left 07/21/2014   Procedure: LEFT KNEE ARTHROSCOPY WITH MEDIAL MENISCAL DEBRIDEMENT ;  Surgeon: Gearlean Alf, MD;  Location: [facility];  Service: Orthopedics;  Laterality: Left;   LAPAROTOMY N/A 09/04/2012   Procedure: EXPLORATORY LAPAROTOMY;  Surgeon: Donato Heinz, MD;  Location: [facility];  Service: General;  Laterality: N/A;   SKIN LESION EXCISION     [name]   TONSILLECTOMY     Social History:  reports that he quit smoking about 14 years ago. His smoking use included cigarettes. He has a 30.00 pack-year smoking history. He has never used smokeless tobacco. He reports that he does not currently use alcohol. He reports that he does not use drugs.   Family History  Problem Relation Age of Onset   Heart disease Mother        Valve replacement and pacemaker   Colon cancer Father    Asthma Other        cousin   Healthy Daughter    Healthy Daughter    Healthy Daughter      Allergies  Allergen Reactions   Demerol [Meperidine] Other (See Comments)    Pt. States "he woke up during a colonoscopy"   Spiriva Handihaler [Tiotropium Bromide Monohydrate] Other (See Comments)    Mild Urinary Retention     Prior to Admission medications   Medication Sig Start Date End Date Taking? Authorizing Provider  aspirin EC 325 MG EC tablet Take 1 tablet (325 mg total) by mouth daily. 04/29/21   Ghimire, Henreitta Leber, MD  atorvastatin (LIPITOR) 40 MG tablet Take 1 tablet (40 mg total) by mouth daily. 04/29/21   Ghimire, Henreitta Leber, MD  azelastine (ASTELIN) 0.1 % nasal spray PLACE 1 SPRAY IN  EACH NOSTRIL ONCE A DAY AS DIRECTED Patient taking differently: 1 spray 2 (two) times daily. 01/17/21   Gottschalk, Ashly M, DO  BREO ELLIPTA 200-25 MCG/INH AEPB Inhale 1 puff into the lungs daily. 01/17/21   [name], DO  Cholecalciferol (VITAMIN D3) 2000 UNITS TABS Take 1 tablet by mouth daily.    [provider]  clopidogrel (PLAVIX) 75 MG tablet Take 1 tablet (75 mg total) by mouth daily. 04/29/21   Ghimire, Henreitta Leber, MD  Cyanocobalamin (VITAMIN B-12) 5000 MCG SUBL Place 1 tablet under the tongue daily.    [provider]  guaiFENesin (MUCINEX) 600 MG 12 hr tablet Take 600 mg by mouth daily as needed for to loosen phlegm.    [provider]  latanoprost (XALATAN) 0.005 % ophthalmic solution Place 1 drop into the left eye at bedtime. 06/01/16   Chipper Herb, MD  pantoprazole (Kermit) 40  MG tablet Take 1 tablet (40 mg total) by mouth daily. 04/28/21 04/28/22  Ghimire, Henreitta Leber, MD  tamsulosin (FLOMAX) 0.4 MG CAPS capsule Take 0.8 mg by mouth daily. 02/11/19   [provider]  timolol (TIMOPTIC) 0.5 % ophthalmic solution Place 1 drop into the left eye every morning. 11/24/18   [provider]  VENTOLIN HFA 108 (90 Base) MCG/ACT inhaler INHALE 2 PUFFS EVERY 6 HOURS AS NEEDED FOR WHEEZING OR SHORTNESS OF BREATH Patient taking differently: 1 puff every 6 (six) hours as needed for shortness of breath. 02/28/21   [name], DO    Review of Systems:  Constitutional:  No weight loss, night sweats, Fevers, chills, fatigue.  Head&Eyes: No headache.  No vision loss.  No eye pain or scotoma ENT:  No Difficulty swallowing,Tooth/dental problems,Sore throat,  No ear ache, post nasal drip,  Cardio-vascular:  No chest pain, Orthopnea, PND, swelling in lower extremities,  dizziness, palpitations  GI:  No  abdominal pain, nausea, vomiting, diarrhea, loss of appetite, hematochezia, melena, heartburn, indigestion, Resp:  No shortness of breath  with exertion or at rest. No cough. No coughing up of blood .No wheezing.No chest wall deformity  Skin:  no rash or lesions.  GU:  no dysuria, change in color of urine, no urgency or frequency. No flank pain.  Musculoskeletal:  No joint pain or swelling. No decreased range of motion. No back pain.  Psych:  No change in mood or affect. No depression or anxiety. Neurologic: no focal weakness, no vision loss. No syncope  Physical Exam: Vitals:   04/29/21 0952 04/29/21 1000 04/29/21 1030 04/29/21 1100  BP:  (!) 153/77 (!) 141/70 136/74  Pulse:  66 67 60  Resp:  12 16 19   Temp:      TempSrc:      SpO2:  99% 98% 97%  Weight: 77.9 kg     Height: 5\' 10"  (1.778 m)      General:  A&O x 3, NAD, nontoxic, pleasant/cooperative Head/Eye: No conjunctival hemorrhage, no icterus, [place]/AT, No nystagmus ENT:  No icterus,  No thrush, good dentition, no pharyngeal exudate Neck:  No masses, no lymphadenpathy, no bruits CV:  RRR, no rub, no gallop, no S3 Lung:  CTAB, good air movement, no wheeze, no rhonchi Abdomen: soft/NT, +BS, nondistended, no peritoneal signs Ext: No cyanosis, No rashes, No petechiae, No lymphangitis, No edema Neuro: CNII-XII intact, strength 4/5 in bilateral upper and lower extremities, no dysmetria   Labs on Admission:  Basic Metabolic Panel: Recent Labs  Lab 04/27/21 1826 04/27/21 1846 04/29/21 0902  NA 138 139 138  K 4.6 4.6 4.5  CL 107 105 106  CO2 24  --  24  GLUCOSE 94 92 103*  BUN 23 25* 26*  CREATININE 1.66* 1.70* 1.58*  CALCIUM 8.8*  --  8.7*   Liver Function Tests: Recent Labs  Lab 04/27/21 1826 04/29/21 0902  AST 23 25  ALT 12 14  ALKPHOS 67 75  BILITOT 0.7 0.9  PROT 6.0* 6.4*  ALBUMIN 3.5 3.6   No results for input(s): LIPASE, AMYLASE in the last 168 hours. No results for input(s): AMMONIA in the last 168 hours. CBC: Recent Labs  Lab 04/27/21 1826 04/27/21 1846 04/29/21 0902  WBC 9.6  --  7.7  NEUTROABS 6.1  --  5.3  HGB 14.4 15.0 14.4   HCT 44.1 44.0 44.4  MCV 95.9  --  96.7  PLT 280  --  256   Coagulation Profile:  Recent Labs  Lab 04/27/21 1826 04/29/21 0902  INR 1.1 1.1   Cardiac Enzymes: No results for input(s): CKTOTAL, CKMB, CKMBINDEX, TROPONINI in the last 168 hours. BNP: Invalid input(s): POCBNP CBG: Recent Labs  Lab 04/28/21 0825  GLUCAP 84   Urine analysis:    Component Value Date/Time   COLORURINE YELLOW 09/04/2012 1334   APPEARANCEUR Clear 01/31/2016 1513   LABSPEC >1.030 (H) 09/04/2012 1334   PHURINE 5.5 09/04/2012 1334   GLUCOSEU Negative 01/31/2016 1513   HGBUR NEGATIVE 09/04/2012 1334   BILIRUBINUR Negative 01/31/2016 1513   KETONESUR NEGATIVE 09/04/2012 1334   PROTEINUR Negative 01/31/2016 1513   PROTEINUR NEGATIVE 09/04/2012 1334   UROBILINOGEN 0.2 09/04/2012 1334   NITRITE Negative 01/31/2016 1513   NITRITE NEGATIVE 09/04/2012 1334   LEUKOCYTESUR Negative 01/31/2016 1513   Sepsis Labs: @LABRCNTIP (procalcitonin:4,lacticidven:4) ) Recent Results (from the past 240 hour(s))  Resp Panel by RT-PCR (Flu A&B, Covid) Nasopharyngeal Swab     Status: None   Collection Time: 04/29/21  9:02 AM   Specimen: Nasopharyngeal Swab; Nasopharyngeal(NP) swabs in vial transport medium  Result Value Ref Range Status   SARS Coronavirus 2 by RT PCR NEGATIVE NEGATIVE Final    Comment: (NOTE) SARS-CoV-2 target nucleic acids are NOT DETECTED.  The SARS-CoV-2 RNA is generally detectable in upper respiratory specimens during the acute phase of infection. The lowest concentration of SARS-CoV-2 viral copies this assay can detect is 138 copies/mL. A negative result does not preclude SARS-Cov-2 infection and should not be used as the sole basis for treatment or other patient management decisions. A negative result may occur with  improper specimen collection/handling, submission of specimen other than nasopharyngeal swab, presence of viral mutation(s) within the areas targeted by this assay, and  inadequate number of viral copies(<138 copies/mL). A negative result must be combined with clinical observations, patient history, and epidemiological information. The expected result is Negative.  Fact Sheet for Patients:  EntrepreneurPulse.com.au  Fact Sheet for Healthcare Providers:  IncredibleEmployment.be  This test is no t yet approved or cleared by the [place] FDA and  has been authorized for detection and/or diagnosis of SARS-CoV-2 by FDA under an Emergency Use Authorization (EUA). This EUA will remain  in effect (meaning this test can be used) for the duration of the COVID-19 declaration under Section 564(b)(1) of the Act, 21 U.S.C.section 360bbb-3(b)(1), unless the authorization is terminated  or revoked sooner.       Influenza A by PCR NEGATIVE NEGATIVE Final   Influenza B by PCR NEGATIVE NEGATIVE Final    Comment: (NOTE) The Xpert Xpress SARS-CoV-2/FLU/RSV plus assay is intended as an aid in the diagnosis of influenza from Nasopharyngeal swab specimens and should not be used as a sole basis for treatment. Nasal washings and aspirates are unacceptable for Xpert Xpress SARS-CoV-2/FLU/RSV testing.  Fact Sheet for Patients: EntrepreneurPulse.com.au  Fact Sheet for Healthcare Providers: IncredibleEmployment.be  This test is not yet approved or cleared by the [place] FDA and has been authorized for detection and/or diagnosis of SARS-CoV-2 by FDA under an Emergency Use Authorization (EUA). This EUA will remain in effect (meaning this test can be used) for the duration of the COVID-19 declaration under Section 564(b)(1) of the Act, 21 U.S.C. section 360bbb-3(b)(1), unless the authorization is terminated or revoked.  Performed at [facility], [address], [place], [place] [address]      Radiological Exams on Admission: MR BRAIN WO CONTRAST  Result Date: 04/28/2021 CLINICAL DATA:   Acute neurologic deficit EXAM: MRI  HEAD WITHOUT CONTRAST TECHNIQUE: Multiplanar, multiecho pulse sequences of the brain and surrounding structures were obtained without intravenous contrast. COMPARISON:  None. FINDINGS: Brain: Small focus of abnormal diffusion restriction in the left insula. No other diffusion abnormality. Focus of chronic microhemorrhage in the right cerebellum . Normal white matter signal. Generalized volume loss without a clear lobar predilection. The midline structures are normal. Vascular: Occluded left ICA.  The other flow voids are normal. Skull and upper cervical spine: Normal calvarium and skull base. Visualized upper cervical spine and soft tissues are normal. Sinuses/Orbits:No paranasal sinus fluid levels or advanced mucosal thickening. No mastoid or middle ear effusion. Normal orbits. IMPRESSION: 1. Small focus of acute ischemia in the left insula. No hemorrhage or mass effect. 2. Occluded left ICA. Electronically Signed   By: Ulyses Jarred M.D.   On: 04/28/2021 01:50   ECHOCARDIOGRAM COMPLETE  Result Date: 04/28/2021    ECHOCARDIOGRAM REPORT   Patient Name:   Charles Golden Date of Exam: 04/28/2021 Medical Rec #:  [id number]     Height:       70.0 in Accession #:    [id number]    Weight:       170.0 lb Date of Birth:  1925-06-08     BSA:          1.948 m Patient Age:    89 years      BP:           158/79 mmHg Patient Gender: M             HR:           63 bpm. Exam Location:  Inpatient Procedure: 2D Echo, Color Doppler and Cardiac Doppler Indications:    Stroke i63.9  History:        Patient has no prior history of Echocardiogram examinations.                 COPD.  Sonographer:    Raquel Sarna Senior RDCS Referring Phys: [id number] Candace Gallus MELVIN  Sonographer Comments: Technically difficult due to poor echo windows, repositioned at end of study for wall motion. IMPRESSIONS  1. Technically difficult study with limited views. Left ventricular ejection fraction, by estimation, is 60 to 65%.  The left ventricle has normal function. The left ventricle has no regional wall motion abnormalities. Left ventricular diastolic parameters are consistent with Grade II diastolic dysfunction (pseudonormalization). Elevated left atrial pressure.  2. Right ventricle is poorly visualized but appears grossly normal systolic function and mild to moderately dilated  3. Left atrial size was mildly dilated.  4. The mitral valve is normal in structure. No evidence of mitral valve regurgitation.  5. The aortic valve was not well visualized. Aortic valve regurgitation is not visualized. No aortic stenosis is present. FINDINGS  Left Ventricle: Left ventricular ejection fraction, by estimation, is 60 to 65%. The left ventricle has normal function. The left ventricle has no regional wall motion abnormalities. The left ventricular internal cavity size was normal in size. There is  no left ventricular hypertrophy. Left ventricular diastolic parameters are consistent with Grade II diastolic dysfunction (pseudonormalization). Elevated left atrial pressure. Right Ventricle: The right ventricular size is moderately enlarged. Right vetricular wall thickness was not well visualized. Right ventricular systolic function is normal. Left Atrium: Left atrial size was mildly dilated. Right Atrium: Right atrial size was normal in size. Pericardium: There is no evidence of pericardial effusion. Mitral Valve: The mitral valve is normal in structure. No evidence of mitral  valve regurgitation. Tricuspid Valve: The tricuspid valve is normal in structure. Tricuspid valve regurgitation is trivial. Aortic Valve: The aortic valve was not well visualized. Aortic valve regurgitation is not visualized. No aortic stenosis is present. Pulmonic Valve: The pulmonic valve was not well visualized. Pulmonic valve regurgitation is not visualized. Aorta: The aortic root is normal in size and structure. IAS/Shunts: The interatrial septum was not well visualized.   LEFT VENTRICLE PLAX 2D LVIDd:         4.20 cm   Diastology LVIDs:         2.70 cm   LV e' medial:    4.20 cm/s LV PW:         1.00 cm   LV E/e' medial:  13.5 LV IVS:        0.80 cm   LV e' lateral:   3.90 cm/s LVOT diam:     2.00 cm   LV E/e' lateral: 14.5 LV SV:         54 LV SV Index:   28 LVOT Area:     3.14 cm  RIGHT VENTRICLE RV S prime:     11.10 cm/s TAPSE (M-mode): 2.0 cm LEFT ATRIUM             Index        RIGHT ATRIUM           Index LA diam:        2.70 cm 1.39 cm/m   RA Area:     15.50 cm LA Vol (A2C):   69.9 ml 35.88 ml/m  RA Volume:   39.10 ml  20.07 ml/m LA Vol (A4C):   39.5 ml 20.28 ml/m LA Biplane Vol: 70.0 ml 35.93 ml/m  AORTIC VALVE LVOT Vmax:   66.10 cm/s LVOT Vmean:  49.600 cm/s LVOT VTI:    0.171 m  AORTA Ao Root diam: 3.30 cm MITRAL VALVE               TRICUSPID VALVE MV Area (PHT): 2.29 cm    TR Peak grad:   27.7 mmHg MV Decel Time: 331 msec    TR Vmax:        263.00 cm/s MV E velocity: 56.60 cm/s MV A velocity: 70.20 cm/s  SHUNTS MV E/A ratio:  0.81        Systemic VTI:  0.17 m                            Systemic Diam: 2.00 cm Oswaldo Milian MD Electronically signed by Oswaldo Milian MD Signature Date/Time: 04/28/2021/1:56:04 PM    Final    CT HEAD CODE STROKE WO CONTRAST`  Result Date: 04/29/2021 CLINICAL DATA:  Code stroke. 87 year old male with aphasia. Occluded left internal carotid artery and left MCA M2 branches on CTA 2 days ago. EXAM: CT HEAD WITHOUT CONTRAST TECHNIQUE: Contiguous axial images were obtained from the base of the skull through the vertex without intravenous contrast. COMPARISON:  [facility] CT head, CTA head and neck 04/27/2021. Brain MRI yesterday. FINDINGS: Brain: Small area of cytotoxic edema in the left insula corresponding to abnormal diffusion yesterday. Nearby hyperdense left MCA M2 branch in the sylvian fissure persist. But elsewhere gray-white matter differentiation remains stable since 04/27/2021. No acute intracranial  hemorrhage identified. No midline shift, mass effect, or evidence of intracranial mass lesion. No ventriculomegaly. Vascular: Calcified atherosclerosis at the skull base. Persistent hyperdense left MCA M2 branch. Skull: No acute  osseous abnormality identified. Sinuses/Orbits: Visualized paranasal sinuses and mastoids are stable and well aerated. Other: No acute orbit or scalp soft tissue finding. ASPECTS [facility] Stroke Program Early CT Score) Total score (0-10 with 10 being normal): 9 (abnormal left insula as seen by MRI yesterday). IMPRESSION: 1. Expected CT appearance of the small left insular infarct on MRI yesterday. Elsewhere stable gray-white matter differentiation since the CT on 04/27/2021. ASPECTS 9. 2. No intracranial hemorrhage or mass effect. Study discussed by telephone with Dr. Davonna Belling on 04/29/2021 at 09:19 . Electronically Signed   By: Genevie Ann M.D.   On: 04/29/2021 09:20   CT HEAD CODE STROKE WO CONTRAST  Result Date: 04/27/2021 CLINICAL DATA:  Code stroke.  Acute neuro deficit. EXAM: CT HEAD WITHOUT CONTRAST TECHNIQUE: Contiguous axial images were obtained from the base of the skull through the vertex without intravenous contrast. COMPARISON:  MRI head 05/14/2013.  CT head 03/11/2009 FINDINGS: Brain: Generalized atrophy. Negative for hydrocephalus. Negative for acute infarct, hemorrhage, mass. Vascular: Hyperdensity of the left middle cerebral artery in the sylvian fissure. Probable left M2 branch occlusion. Correlate with symptoms. Skull: Negative Sinuses/Orbits: Mild mucosal edema right maxillary sinus. Bilateral cataract extraction Other: None ASPECTS ([place] Stroke Program Early CT Score) - Ganglionic level infarction (caudate, lentiform nuclei, internal capsule, insula, M1-M3 cortex): 7 - Supraganglionic infarction (M4-M6 cortex): 3 Total score (0-10 with 10 being normal): 10 IMPRESSION: 1. Negative for acute infarct or hemorrhage 2. Hyperdense left MCA branch in the sylvian  fissure likely due to acute thrombosis. Correlate with symptoms. 3. ASPECTS is 10 4. Code stroke imaging results were communicated on 04/27/2021 at 6:39 pm to provider Lindzen via text page amion Electronically Signed   By: Franchot Gallo M.D.   On: 04/27/2021 18:46   CT ANGIO HEAD NECK W WO CM (CODE STROKE)  Result Date: 04/27/2021 CLINICAL DATA:  Acute neuro deficit. Speech abnormality which has cleared. EXAM: CT ANGIOGRAPHY HEAD AND NECK TECHNIQUE: Multidetector CT imaging of the head and neck was performed using the standard protocol during bolus administration of intravenous contrast. Multiplanar CT image reconstructions and MIPs were obtained to evaluate the vascular anatomy. Carotid stenosis measurements (when applicable) are obtained utilizing NASCET criteria, using the distal internal carotid diameter as the denominator. CONTRAST:  54mL OMNIPAQUE IOHEXOL 350 MG/ML SOLN COMPARISON:  CT head 04/27/2021 FINDINGS: CTA NECK FINDINGS Aortic arch: Atherosclerotic aortic arch and proximal great vessels. Proximal great vessels are patent. Right carotid system: Atherosclerotic calcification right carotid bifurcation without significant stenosis Left carotid system: Atherosclerotic disease at the left carotid bifurcation. Occlusion of the proximal left internal carotid artery. Left external carotid artery patent. Vertebral arteries: Both vertebral arteries are patent to the skull base with mild stenosis proximally on the right. Skeleton: Cervical spondylosis.  No acute skeletal abnormality. Other neck: Negative for mass or adenopathy Upper chest: Lung apices clear bilaterally. Review of the MIP images confirms the above findings CTA HEAD FINDINGS Anterior circulation: Right cavernous carotid widely patent. Right anterior and right middle cerebral arteries widely patent. Left internal carotid artery is occluded with reconstitution of the supraclinoid segment. Left anterior cerebral artery patent. Left M1 segment  patent. Occlusion of the proximal left M2 branch inferior division which appears acute. Posterior circulation: Both vertebral arteries patent to the basilar. PICA patent bilaterally. Basilar widely patent. Superior cerebellar and posterior cerebral arteries patent bilaterally. Venous sinuses: Normal venous enhancement Anatomic variants: None Review of the MIP images confirms the above findings IMPRESSION: 1. Occlusion of the proximal  left internal carotid artery of indeterminate age. However, this is likely acute given the acute occlusion of the inferior division of the left MCA M2 branch. This was hyperdense on CT consistent with acute thrombus. 2. No significant right carotid stenosis. No significant vertebral stenosis. 3. These results were called by telephone at the time of interpretation on 04/27/2021 at 7:24 pm to provider ERIC [facility] , who verbally acknowledged these results. Electronically Signed   By: Franchot Gallo M.D.   On: 04/27/2021 19:29   CT ANGIO HEAD NECK W WO CM W PERF (CODE STROKE)  Result Date: 04/29/2021 CLINICAL DATA:  Neuro deficit, acute, stroke suspected EXAM: CT ANGIOGRAPHY HEAD AND NECK CT PERFUSION BRAIN TECHNIQUE: Multidetector CT imaging of the head and neck was performed using the standard protocol during bolus administration of intravenous contrast. Multiplanar CT image reconstructions and MIPs were obtained to evaluate the vascular anatomy. Carotid stenosis measurements (when applicable) are obtained utilizing NASCET criteria, using the distal internal carotid diameter as the denominator. Multiphase CT imaging of the brain was performed following IV bolus contrast injection. Subsequent parametric perfusion maps were calculated using RAPID software. CONTRAST:  155mL OMNIPAQUE IOHEXOL 350 MG/ML SOLN COMPARISON:  Recent CTA and MRI brain FINDINGS: CTA NECK Aortic arch: Calcified plaque along the arch and patent great vessel origins. Right carotid system: Patent. Stable appearance.  No hemodynamically significant stenosis at the ICA origin. Left carotid system: Patent common carotid. There has been some recanalization of the internal carotid artery. Persistent mixed plaque including irregular noncalcified plaque along the proximal ICA with superimposed thrombus. There is focal near occlusion with submillimeter residual lumen. Vessel caliber remains small reflecting flow limitation. Vertebral arteries: Patent.  Stable appearance. Skeleton: Stable advanced cervical spine degenerative changes. Other neck: No new abnormality. Upper chest: Emphysema.  No new abnormality. Review of the MIP images confirms the above findings CTA HEAD Anterior circulation: As in the neck, there is no flow present within the left ICA. Left M1 MCA is patent. Persistent left M2 MCA occlusion although now slightly more distal. There is some reconstitution at the M2-M3 junction as before. Right middle and both anterior cerebral arteries are patent. Posterior circulation: Intracranial vertebral arteries are patent. Basilar artery is patent. Major cerebellar artery origins are patent. Posterior cerebral arteries are patent. Venous sinuses: Patent as allowed by contrast bolus timing. Review of the MIP images confirms the above findings CT Brain Perfusion Findings: CBF (<30%) Volume: 40mL Perfusion (Tmax>6.0s) volume: 57mL Mismatch Volume: 38mL Infarction Location: Left MCA territory IMPRESSION: Interval partial recanalization of the cervical left ICA. Remains focal near occlusion with submillimeter lumen and decreased distal vessel caliber reflecting flow limitation. Intracranially, the left ICA is now patent. There is a persistent left M2 MCA occlusion, but the occlusion now occurs more distally within the sylvian fissure. Some distal reconstitution is present. Perfusion imaging demonstrates no core infarction and 83 mL of penumbra in the left MCA territory. Electronically Signed   By: Macy Mis M.D.   On: 04/29/2021  10:01    EKG: Independently reviewed. Sinus, nonspecific T wave changes    Time spent:60 minutes Code Status:   FULL Family Communication:  daughter updated at bedside 12/3 Disposition Plan: expect 2 day hospitalization Consults called: neurology DVT Prophylaxis: [place] Lovenox  Orson Eva, DO  [facility] Pager [phone]  If 7PM-7AM, please contact night-coverage [email or web address] Password [email or web address] 04/29/2021, 11:54 AM

## 2021-04-29 NOTE — ED Notes (Signed)
Pt returned to ED room from CT.

## 2021-04-29 NOTE — ED Triage Notes (Signed)
Just dc'd yesterday after stroke from [facility].  Had no neuro deficits went to bed at 2100 and woke this am around 730 with slurred speech expressive aphasia and difficulty ambulating.  Code stroke called in the field.

## 2021-04-29 NOTE — ED Notes (Signed)
Pt CBG 91. 

## 2021-04-29 NOTE — Consult Note (Signed)
[facility] Telemedicine Consult   Requesting Provider: Robyn Haber Consult Participants: Patient, daughter Location of the provider: [place] Location of the patient: [facility]  This consult was provided via telemedicine with 2-way video and audio communication. The patient/family was informed that care would be provided in this way and agreed to receive care in this manner.    Chief Complaint: Charles Golden  HPI: 87 yo M with recent stroke in the setting of ICA occlusion was discharged yesterday. This morning, he got up a little after 7. When he came out, he was stumbling.  His daughter noticed that he was not speaking very well.  Since that time, his deficits have been static.  Because of this he was brought into the emergency department via EMS and they took him to [facility].  Due to these new deficits a code stroke was activated.    LKW: 12/1, though worsened between 12/2 prior to bed and this AM.  tnk given?: No, recent stroke IR Thrombectomy? No, out of window.  Time of notification: 09:10 Time of teleneurologist logon: 09:11 Time of NIHSS(pt was in CT at time of notification):9:45   Exam: Vitals:   04/29/21 0945  BP: (!) 159/84  Pulse: 72  Resp: 18  SpO2: 98%      General: In bed, NAD  1A: Level of Consciousness - 0 1B: Ask Month and Age - 0 1C: 'Blink Eyes' & 'Squeeze Hands' - 0 2: Test Horizontal Extraocular Movements - 0 3: Test Visual Fields - 0 4: Test Facial Palsy - 0 5A: Test Left Arm Motor Drift - 0 5B: Test Right Arm Motor Drift - 0 6A: Test Left Leg Motor Drift - 0 6B: Test Right Leg Motor Drift - 0 7: Test Limb Ataxia - 0 8: Test Sensation - 0 9: Test Language/Aphasia- 1(Mild difficulty with occasional paraphasic errors, impaired repetition).  10: Test Dysarthria - 0 11: Test Extinction/Inattention - 0 NIHSS score: 1   Imaging Reviewed: CT head - no new findings CTA - L ICA/M2 occlusion,  unchanged  Labs reviewed in epic and pertinent values follow: CBG 91 CBC unremrakable  Assessment: 87 yo M with fluctuating symptoms in the setting of recent ICA occlusion. He is not an IV TNK candidate give recent stroke. I suspect that this is due to hypoperfusion, but would favor overnight monitoring and repeat MRI brain.   Flomax can lower blood pressure, and he did take it last night and I wonder if his blood pressure lowered overnight resulting in some extension of his infarct.  Recommendations:  1) MRI brain(will need transfer to [facility] given lack of availability at [facility]) 2) CTA head and neck 3) maintain BP with goal 130 - 150 4) would hold Flomax tonight, potentially changing to a.m. dosing? 5) stroke team to follow after arrival to [facility]    This patient is receiving care for possible acute neurological changes. There was 55 minutes of care by this provider at the time of service, including time for direct evaluation via telemedicine, review of medical records, imaging studies and discussion of findings with providers, the patient and/or family.  Roland Rack, MD [facility] [phone]  If 7pm- 7am, please page neurology on call as listed in [place].

## 2021-04-29 NOTE — ED Provider Notes (Signed)
[facility] EMERGENCY DEPARTMENT Provider Note   CSN: [id number] Arrival date & time: 04/29/21  2878  An emergency department physician performed an initial assessment on this suspected stroke patient at 0900.  History Chief Complaint  Patient presents with   Code Stroke    TAEVEON KEESLING is a 87 y.o. male.  HPI Patient came in as a code stroke called by EMS.  Last normal last night.  Difficulty speaking and difficulty walking.  Discharged from the hospital yesterday for similar symptoms.  Found to have a left insular stroke.  Also has chronic internal carotid artery occlusion and appears to had also an M2 occlusion.  No intervention has been done but plan to keep a elevated blood pressure.  Patient woke this morning with more difficulty.  States he feels just a little bad.  Reviewing neurology notes it appears as if he had no deficits at the time of discharge.  Now does have some difficulty speaking.  More difficulty finding some words.  Reported BP for EMS was 676 systolic.  Sugar was also above 100.    Past Medical History:  Diagnosis Date   Bronchitis, chronic (HCC)    COPD (chronic obstructive pulmonary disease) (HCC)    Enlarged prostate    GERD (gastroesophageal reflux disease)    Glaucoma    Glucagonoma    Hernia, incisional    at present   [facility] (hard of hearing)    IBS (irritable bowel syndrome)    Sciatic pain    Sinus congestion     Patient Active Problem List   Diagnosis Date Noted   TIA (transient ischemic attack) 04/27/2021   Chronic tension-type headache, not intractable 12/13/2020   Bilateral sensorineural hearing loss 09/25/2019   Bronchiectasis without complication ([place]) 72/01/4708   Fatigue 05/27/2019   Vitamin D deficiency 01/21/2019   Weakness 01/21/2019   Recurrent right inguinal hernia    Presbycusis of both ears 07/19/2017   Restless leg syndrome 11/25/2016   Collagenous colitis 07/24/2016   Deviated nasal septum 09/03/2015   Basal cell  carcinoma of nose 09/03/2015   Thoracic aortic atherosclerosis ([place]) 05/03/2015   Upper airway cough syndrome 04/28/2015   GERD without esophagitis 04/11/2015   Acute medial meniscal tear 07/20/2014   COPD GOLD I  12/08/2013   Small bowel volvulus ([place]) 08/05/2013   Chronic headache 08/05/2013   Left inguinal hernia 08/05/2013   Incisional hernia, without obstruction or gangrene 08/05/2013   Headache 05/01/2013   Obstructive chronic bronchitis without exacerbation ([place]) 04/30/2013   Left hip pain 04/29/2013   BPH (benign prostatic hyperplasia) 01/05/2013   IBS (irritable bowel syndrome) 09/03/2012   Chronic rhinosinusitis 09/03/2012   Lung nodules 05/04/2003    Past Surgical History:  Procedure Laterality Date   BOWEL RESECTION N/A 09/04/2012   Procedure: SMALL BOWEL RESECTION;  Surgeon: Donato Heinz, MD;  Location: [facility];  Service: General;  Laterality: N/A;  Anastimosis   HEMORRHOID SURGERY     HERNIA REPAIR Bilateral 70's   INGUINAL HERNIA REPAIR Left 09/14/2013   Procedure: LEFT INGUINAL HERNIORRHAPHY;  Surgeon: Jamesetta So, MD;  Location: [facility];  Service: General;  Laterality: Left;   INGUINAL HERNIA REPAIR Right 12/23/2018   Procedure: RECURRENT RIGHT INGUINAL HERNIA  REPAIR  WITH MESH;  Surgeon: Aviva Signs, MD;  Location: [facility];  Service: General;  Laterality: Right;   INSERTION OF MESH Left 09/14/2013   Procedure: INSERTION OF MESH;  Surgeon: Jamesetta So, MD;  Location: [facility];  Service: General;  Laterality: Left;   KNEE ARTHROSCOPY Left 07/21/2014   Procedure: LEFT KNEE ARTHROSCOPY WITH MEDIAL MENISCAL DEBRIDEMENT ;  Surgeon: Gearlean Alf, MD;  Location: [facility];  Service: Orthopedics;  Laterality: Left;   LAPAROTOMY N/A 09/04/2012   Procedure: EXPLORATORY LAPAROTOMY;  Surgeon: Donato Heinz, MD;  Location: [facility];  Service: General;  Laterality: N/A;   SKIN LESION EXCISION     [name]   TONSILLECTOMY         Family History  Problem Relation Age of  Onset   Heart disease Mother        Valve replacement and pacemaker   Colon cancer Father    Asthma Other        cousin   Healthy Daughter    Healthy Daughter    Healthy Daughter     Social History   Tobacco Use   Smoking status: Former    Packs/day: 1.00    Years: 30.00    Pack years: 30.00    Types: Cigarettes    Quit date: 05/28/2006    Years since quitting: 14.9   Smokeless tobacco: Never  Vaping Use   Vaping Use: Never used  Substance Use Topics   Alcohol use: Not Currently    Alcohol/week: 0.0 standard drinks   Drug use: No    Home Medications Prior to Admission medications   Medication Sig Start Date End Date Taking? Authorizing Provider  aspirin EC 325 MG EC tablet Take 1 tablet (325 mg total) by mouth daily. 04/29/21   Ghimire, Henreitta Leber, MD  atorvastatin (LIPITOR) 40 MG tablet Take 1 tablet (40 mg total) by mouth daily. 04/29/21   Ghimire, Henreitta Leber, MD  azelastine (ASTELIN) 0.1 % nasal spray PLACE 1 SPRAY IN EACH NOSTRIL ONCE A DAY AS DIRECTED Patient taking differently: 1 spray 2 (two) times daily. 01/17/21   Gottschalk, Ashly M, DO  BREO ELLIPTA 200-25 MCG/INH AEPB Inhale 1 puff into the lungs daily. 01/17/21   [name], DO  Cholecalciferol (VITAMIN D3) 2000 UNITS TABS Take 1 tablet by mouth daily.    [provider]  clopidogrel (PLAVIX) 75 MG tablet Take 1 tablet (75 mg total) by mouth daily. 04/29/21   Ghimire, Henreitta Leber, MD  Cyanocobalamin (VITAMIN B-12) 5000 MCG SUBL Place 1 tablet under the tongue daily.    [provider]  guaiFENesin (MUCINEX) 600 MG 12 hr tablet Take 600 mg by mouth daily as needed for to loosen phlegm.    [provider]  latanoprost (XALATAN) 0.005 % ophthalmic solution Place 1 drop into the left eye at bedtime. 06/01/16   Chipper Herb, MD  pantoprazole (PROTONIX) 40 MG tablet Take 1 tablet (40 mg total) by mouth daily. 04/28/21 04/28/22  Ghimire, Henreitta Leber, MD  tamsulosin (FLOMAX) 0.4 MG CAPS capsule  Take 0.8 mg by mouth daily. 02/11/19   [provider]  timolol (TIMOPTIC) 0.5 % ophthalmic solution Place 1 drop into the left eye every morning. 11/24/18   [provider]  VENTOLIN HFA 108 (90 Base) MCG/ACT inhaler INHALE 2 PUFFS EVERY 6 HOURS AS NEEDED FOR WHEEZING OR SHORTNESS OF BREATH Patient taking differently: 1 puff every 6 (six) hours as needed for shortness of breath. 02/28/21   [name], DO    Allergies    Demerol [meperidine] and Spiriva handihaler [tiotropium bromide monohydrate]  Review of Systems   Review of Systems  Constitutional:  Negative for appetite change.  HENT:  Negative  for congestion.   Cardiovascular:  Negative for chest pain.  Gastrointestinal:  Negative for abdominal pain.  Genitourinary:  Negative for flank pain.  Musculoskeletal:  Negative for back pain.  Neurological:  Positive for speech difficulty. Negative for facial asymmetry.   Physical Exam Updated Vital Signs BP (!) 141/70 (BP Location: Right Arm)   Pulse 67   Temp 98 F (36.7 C) (Oral)   Resp 16   Ht 5\' 10"  (1.778 m)   Wt 77.9 kg   SpO2 98%   BMI 24.64 kg/m   Physical Exam Vitals and nursing note reviewed.  Constitutional:      Appearance: Normal appearance.  HENT:     Head: Atraumatic.  Eyes:     Pupils: Pupils are equal, round, and reactive to light.  Cardiovascular:     Rate and Rhythm: Regular rhythm.  Pulmonary:     Breath sounds: No wheezing or rhonchi.  Abdominal:     Tenderness: There is no guarding or rebound.  Musculoskeletal:        General: No tenderness.     Cervical back: Neck supple.  Skin:    General: Skin is warm.  Neurological:     Mental Status: He is alert.     Comments: Awake and answers questions.  Oriented, but does have some mild confusion or word finding difficulty.  Able to name a pen but slow to answer and hesitates to find some of the words.  Face symmetric.  Eye movements intact.  Finger-nose appears slightly off  bilaterally.    ED Results / Procedures / Treatments   Labs (all labs ordered are listed, but only abnormal results are displayed) Labs Reviewed  COMPREHENSIVE METABOLIC PANEL - Abnormal; Notable for the following components:      Result Value   Glucose, Bld 103 (*)    BUN 26 (*)    Creatinine, Ser 1.58 (*)    Calcium 8.7 (*)    Total Protein 6.4 (*)    GFR, Estimated 41 (*)    All other components within normal limits  RESP PANEL BY RT-PCR (FLU A&B, COVID) ARPGX2  ETHANOL  PROTIME-INR  APTT  CBC  DIFFERENTIAL  RAPID URINE DRUG SCREEN, HOSP PERFORMED  URINALYSIS, ROUTINE W REFLEX MICROSCOPIC    EKG None  Radiology MR BRAIN WO CONTRAST  Result Date: 04/28/2021 CLINICAL DATA:  Acute neurologic deficit EXAM: MRI HEAD WITHOUT CONTRAST TECHNIQUE: Multiplanar, multiecho pulse sequences of the brain and surrounding structures were obtained without intravenous contrast. COMPARISON:  None. FINDINGS: Brain: Small focus of abnormal diffusion restriction in the left insula. No other diffusion abnormality. Focus of chronic microhemorrhage in the right cerebellum . Normal white matter signal. Generalized volume loss without a clear lobar predilection. The midline structures are normal. Vascular: Occluded left ICA.  The other flow voids are normal. Skull and upper cervical spine: Normal calvarium and skull base. Visualized upper cervical spine and soft tissues are normal. Sinuses/Orbits:No paranasal sinus fluid levels or advanced mucosal thickening. No mastoid or middle ear effusion. Normal orbits. IMPRESSION: 1. Small focus of acute ischemia in the left insula. No hemorrhage or mass effect. 2. Occluded left ICA. Electronically Signed   By: Ulyses Jarred M.D.   On: 04/28/2021 01:50   ECHOCARDIOGRAM COMPLETE  Result Date: 04/28/2021    ECHOCARDIOGRAM REPORT   Patient Name:   QUINCY BOY Date of Exam: 04/28/2021 Medical Rec #:  [id number]     Height:       70.0 in Accession #:  [id number]     Weight:       170.0 lb Date of Birth:  1925/08/28     BSA:          1.948 m Patient Age:    33 years      BP:           158/79 mmHg Patient Gender: M             HR:           63 bpm. Exam Location:  Inpatient Procedure: 2D Echo, Color Doppler and Cardiac Doppler Indications:    Stroke i63.9  History:        Patient has no prior history of Echocardiogram examinations.                 COPD.  Sonographer:    Raquel Sarna Senior RDCS Referring Phys: [id number] Candace Gallus MELVIN  Sonographer Comments: Technically difficult due to poor echo windows, repositioned at end of study for wall motion. IMPRESSIONS  1. Technically difficult study with limited views. Left ventricular ejection fraction, by estimation, is 60 to 65%. The left ventricle has normal function. The left ventricle has no regional wall motion abnormalities. Left ventricular diastolic parameters are consistent with Grade II diastolic dysfunction (pseudonormalization). Elevated left atrial pressure.  2. Right ventricle is poorly visualized but appears grossly normal systolic function and mild to moderately dilated  3. Left atrial size was mildly dilated.  4. The mitral valve is normal in structure. No evidence of mitral valve regurgitation.  5. The aortic valve was not well visualized. Aortic valve regurgitation is not visualized. No aortic stenosis is present. FINDINGS  Left Ventricle: Left ventricular ejection fraction, by estimation, is 60 to 65%. The left ventricle has normal function. The left ventricle has no regional wall motion abnormalities. The left ventricular internal cavity size was normal in size. There is  no left ventricular hypertrophy. Left ventricular diastolic parameters are consistent with Grade II diastolic dysfunction (pseudonormalization). Elevated left atrial pressure. Right Ventricle: The right ventricular size is moderately enlarged. Right vetricular wall thickness was not well visualized. Right ventricular systolic function is normal. Left  Atrium: Left atrial size was mildly dilated. Right Atrium: Right atrial size was normal in size. Pericardium: There is no evidence of pericardial effusion. Mitral Valve: The mitral valve is normal in structure. No evidence of mitral valve regurgitation. Tricuspid Valve: The tricuspid valve is normal in structure. Tricuspid valve regurgitation is trivial. Aortic Valve: The aortic valve was not well visualized. Aortic valve regurgitation is not visualized. No aortic stenosis is present. Pulmonic Valve: The pulmonic valve was not well visualized. Pulmonic valve regurgitation is not visualized. Aorta: The aortic root is normal in size and structure. IAS/Shunts: The interatrial septum was not well visualized.  LEFT VENTRICLE PLAX 2D LVIDd:         4.20 cm   Diastology LVIDs:         2.70 cm   LV e' medial:    4.20 cm/s LV PW:         1.00 cm   LV E/e' medial:  13.5 LV IVS:        0.80 cm   LV e' lateral:   3.90 cm/s LVOT diam:     2.00 cm   LV E/e' lateral: 14.5 LV SV:         54 LV SV Index:   28 LVOT Area:     3.14 cm  RIGHT VENTRICLE RV S prime:  11.10 cm/s TAPSE (M-mode): 2.0 cm LEFT ATRIUM             Index        RIGHT ATRIUM           Index LA diam:        2.70 cm 1.39 cm/m   RA Area:     15.50 cm LA Vol (A2C):   69.9 ml 35.88 ml/m  RA Volume:   39.10 ml  20.07 ml/m LA Vol (A4C):   39.5 ml 20.28 ml/m LA Biplane Vol: 70.0 ml 35.93 ml/m  AORTIC VALVE LVOT Vmax:   66.10 cm/s LVOT Vmean:  49.600 cm/s LVOT VTI:    0.171 m  AORTA Ao Root diam: 3.30 cm MITRAL VALVE               TRICUSPID VALVE MV Area (PHT): 2.29 cm    TR Peak grad:   27.7 mmHg MV Decel Time: 331 msec    TR Vmax:        263.00 cm/s MV E velocity: 56.60 cm/s MV A velocity: 70.20 cm/s  SHUNTS MV E/A ratio:  0.81        Systemic VTI:  0.17 m                            Systemic Diam: 2.00 cm Oswaldo Milian MD Electronically signed by Oswaldo Milian MD Signature Date/Time: 04/28/2021/1:56:04 PM    Final    CT HEAD CODE STROKE WO  CONTRAST`  Result Date: 04/29/2021 CLINICAL DATA:  Code stroke. 87 year old male with aphasia. Occluded left internal carotid artery and left MCA M2 branches on CTA 2 days ago. EXAM: CT HEAD WITHOUT CONTRAST TECHNIQUE: Contiguous axial images were obtained from the base of the skull through the vertex without intravenous contrast. COMPARISON:  [facility] CT head, CTA head and neck 04/27/2021. Brain MRI yesterday. FINDINGS: Brain: Small area of cytotoxic edema in the left insula corresponding to abnormal diffusion yesterday. Nearby hyperdense left MCA M2 branch in the sylvian fissure persist. But elsewhere gray-white matter differentiation remains stable since 04/27/2021. No acute intracranial hemorrhage identified. No midline shift, mass effect, or evidence of intracranial mass lesion. No ventriculomegaly. Vascular: Calcified atherosclerosis at the skull base. Persistent hyperdense left MCA M2 branch. Skull: No acute osseous abnormality identified. Sinuses/Orbits: Visualized paranasal sinuses and mastoids are stable and well aerated. Other: No acute orbit or scalp soft tissue finding. ASPECTS [facility] Stroke Program Early CT Score) Total score (0-10 with 10 being normal): 9 (abnormal left insula as seen by MRI yesterday). IMPRESSION: 1. Expected CT appearance of the small left insular infarct on MRI yesterday. Elsewhere stable gray-white matter differentiation since the CT on 04/27/2021. ASPECTS 9. 2. No intracranial hemorrhage or mass effect. Study discussed by telephone with Dr. Davonna Belling on 04/29/2021 at 09:19 . Electronically Signed   By: Genevie Ann M.D.   On: 04/29/2021 09:20   CT HEAD CODE STROKE WO CONTRAST  Result Date: 04/27/2021 CLINICAL DATA:  Code stroke.  Acute neuro deficit. EXAM: CT HEAD WITHOUT CONTRAST TECHNIQUE: Contiguous axial images were obtained from the base of the skull through the vertex without intravenous contrast. COMPARISON:  MRI head 05/14/2013.  CT head 03/11/2009  FINDINGS: Brain: Generalized atrophy. Negative for hydrocephalus. Negative for acute infarct, hemorrhage, mass. Vascular: Hyperdensity of the left middle cerebral artery in the sylvian fissure. Probable left M2 branch occlusion. Correlate with symptoms. Skull: Negative Sinuses/Orbits: Mild mucosal  edema right maxillary sinus. Bilateral cataract extraction Other: None ASPECTS ([place] Stroke Program Early CT Score) - Ganglionic level infarction (caudate, lentiform nuclei, internal capsule, insula, M1-M3 cortex): 7 - Supraganglionic infarction (M4-M6 cortex): 3 Total score (0-10 with 10 being normal): 10 IMPRESSION: 1. Negative for acute infarct or hemorrhage 2. Hyperdense left MCA branch in the sylvian fissure likely due to acute thrombosis. Correlate with symptoms. 3. ASPECTS is 10 4. Code stroke imaging results were communicated on 04/27/2021 at 6:39 pm to provider [place] via text page amion Electronically Signed   By: Franchot Gallo M.D.   On: 04/27/2021 18:46   CT ANGIO HEAD NECK W WO CM (CODE STROKE)  Result Date: 04/27/2021 CLINICAL DATA:  Acute neuro deficit. Speech abnormality which has cleared. EXAM: CT ANGIOGRAPHY HEAD AND NECK TECHNIQUE: Multidetector CT imaging of the head and neck was performed using the standard protocol during bolus administration of intravenous contrast. Multiplanar CT image reconstructions and MIPs were obtained to evaluate the vascular anatomy. Carotid stenosis measurements (when applicable) are obtained utilizing NASCET criteria, using the distal internal carotid diameter as the denominator. CONTRAST:  46mL OMNIPAQUE IOHEXOL 350 MG/ML SOLN COMPARISON:  CT head 04/27/2021 FINDINGS: CTA NECK FINDINGS Aortic arch: Atherosclerotic aortic arch and proximal great vessels. Proximal great vessels are patent. Right carotid system: Atherosclerotic calcification right carotid bifurcation without significant stenosis Left carotid system: Atherosclerotic disease at the left carotid  bifurcation. Occlusion of the proximal left internal carotid artery. Left external carotid artery patent. Vertebral arteries: Both vertebral arteries are patent to the skull base with mild stenosis proximally on the right. Skeleton: Cervical spondylosis.  No acute skeletal abnormality. Other neck: Negative for mass or adenopathy Upper chest: Lung apices clear bilaterally. Review of the MIP images confirms the above findings CTA HEAD FINDINGS Anterior circulation: Right cavernous carotid widely patent. Right anterior and right middle cerebral arteries widely patent. Left internal carotid artery is occluded with reconstitution of the supraclinoid segment. Left anterior cerebral artery patent. Left M1 segment patent. Occlusion of the proximal left M2 branch inferior division which appears acute. Posterior circulation: Both vertebral arteries patent to the basilar. PICA patent bilaterally. Basilar widely patent. Superior cerebellar and posterior cerebral arteries patent bilaterally. Venous sinuses: Normal venous enhancement Anatomic variants: None Review of the MIP images confirms the above findings IMPRESSION: 1. Occlusion of the proximal left internal carotid artery of indeterminate age. However, this is likely acute given the acute occlusion of the inferior division of the left MCA M2 branch. This was hyperdense on CT consistent with acute thrombus. 2. No significant right carotid stenosis. No significant vertebral stenosis. 3. These results were called by telephone at the time of interpretation on 04/27/2021 at 7:24 pm to provider ERIC [facility] , who verbally acknowledged these results. Electronically Signed   By: Franchot Gallo M.D.   On: 04/27/2021 19:29   CT ANGIO HEAD NECK W WO CM W PERF (CODE STROKE)  Result Date: 04/29/2021 CLINICAL DATA:  Neuro deficit, acute, stroke suspected EXAM: CT ANGIOGRAPHY HEAD AND NECK CT PERFUSION BRAIN TECHNIQUE: Multidetector CT imaging of the head and neck was performed using  the standard protocol during bolus administration of intravenous contrast. Multiplanar CT image reconstructions and MIPs were obtained to evaluate the vascular anatomy. Carotid stenosis measurements (when applicable) are obtained utilizing NASCET criteria, using the distal internal carotid diameter as the denominator. Multiphase CT imaging of the brain was performed following IV bolus contrast injection. Subsequent parametric perfusion maps were calculated using RAPID software. CONTRAST:  164mL OMNIPAQUE IOHEXOL 350 MG/ML SOLN COMPARISON:  Recent CTA and MRI brain FINDINGS: CTA NECK Aortic arch: Calcified plaque along the arch and patent great vessel origins. Right carotid system: Patent. Stable appearance. No hemodynamically significant stenosis at the ICA origin. Left carotid system: Patent common carotid. There has been some recanalization of the internal carotid artery. Persistent mixed plaque including irregular noncalcified plaque along the proximal ICA with superimposed thrombus. There is focal near occlusion with submillimeter residual lumen. Vessel caliber remains small reflecting flow limitation. Vertebral arteries: Patent.  Stable appearance. Skeleton: Stable advanced cervical spine degenerative changes. Other neck: No new abnormality. Upper chest: Emphysema.  No new abnormality. Review of the MIP images confirms the above findings CTA HEAD Anterior circulation: As in the neck, there is no flow present within the left ICA. Left M1 MCA is patent. Persistent left M2 MCA occlusion although now slightly more distal. There is some reconstitution at the M2-M3 junction as before. Right middle and both anterior cerebral arteries are patent. Posterior circulation: Intracranial vertebral arteries are patent. Basilar artery is patent. Major cerebellar artery origins are patent. Posterior cerebral arteries are patent. Venous sinuses: Patent as allowed by contrast bolus timing. Review of the MIP images confirms the  above findings CT Brain Perfusion Findings: CBF (<30%) Volume: 65mL Perfusion (Tmax>6.0s) volume: 25mL Mismatch Volume: 49mL Infarction Location: Left MCA territory IMPRESSION: Interval partial recanalization of the cervical left ICA. Remains focal near occlusion with submillimeter lumen and decreased distal vessel caliber reflecting flow limitation. Intracranially, the left ICA is now patent. There is a persistent left M2 MCA occlusion, but the occlusion now occurs more distally within the sylvian fissure. Some distal reconstitution is present. Perfusion imaging demonstrates no core infarction and 83 mL of penumbra in the left MCA territory. Electronically Signed   By: Macy Mis M.D.   On: 04/29/2021 10:01    Procedures Procedures   Medications Ordered in ED Medications  iohexol (OMNIPAQUE) 350 MG/ML injection 100 mL (100 mLs Intravenous Contrast Given 04/29/21 5701)    ED Course  I have reviewed the triage vital signs and the nursing notes.  Pertinent labs & imaging results that were available during my care of the patient were reviewed by me and considered in my medical decision making (see chart for details).    MDM Rules/Calculators/A&P                           Patient woke up this morning with difficulty speaking and some dizziness.  Discharged from the hospital yesterday after stroke with similar symptoms.  Last normal was last night.  For EMS reportedly had a pressure of 779 systolic.  Potentially this could have been because of the neurodeficits.  Code stroke of been called by EMS.  Upon arrival I did verify some neurodeficits although it reportedly had improved from even earlier today.  Some word finding but otherwise rather benign exam.  Head CT reassuring with just changes from recent stroke.  CTA done and showed some mild variations of basically the same findings as with recent admission.  Discussed with Dr. Leonel Ramsay from neurology.  He had seen the patient as a code stroke  with telemetry neurology.  Patient benefit from admission to the hospital.  Also want to keep his blood pressure up.  Not a candidate for tPA.  Only would do acute intervention if developed severe deficits such as a total aphasia along with hemiplegia.  Will admit to [facility] so the MRI can be done on the admission.  CRITICAL CARE Performed by: Davonna Belling Total critical care time: 30 minutes Critical care time was exclusive of separately billable procedures and treating other patients. Critical care was necessary to treat or prevent imminent or life-threatening deterioration. Critical care was time spent personally by me on the following activities: development of treatment plan with patient and/or surrogate as well as nursing, discussions with consultants, evaluation of patient's response to treatment, examination of patient, obtaining history from patient or surrogate, ordering and performing treatments and interventions, ordering and review of laboratory studies, ordering and review of radiographic studies, pulse oximetry and re-evaluation of patient's condition.  Final Clinical Impression(s) / ED Diagnoses Final diagnoses:  Cerebrovascular accident (CVA), unspecified mechanism [facility])    Rx / Nowthen Orders ED Discharge Orders     None        Davonna Belling, MD 04/29/21 1052

## 2021-04-29 NOTE — ED Notes (Signed)
Admitting MD at bedside.

## 2021-04-29 NOTE — ED Triage Notes (Signed)
Pt brought in by [facility] from home with c/o waking up this morning at 0730 and having abnormal gait, expressive aphasia and headache. Pt went to bed feeling normal at 2100 last night. Pt just discharged from [facility] yesterday with a stroke.

## 2021-04-29 NOTE — Progress Notes (Incomplete)
Code stroke time documentation  0845 Call time 1146 Beeper Time 4314 Exam started 2767 Exam finished 0110 Images sent to [place] Exam completed in [facility] called

## 2021-04-30 LAB — LIPID PANEL
Cholesterol: 105 mg/dL (ref 0–200)
HDL: 42 mg/dL (ref >40)
LDL Cholesterol: 50 mg/dL (ref 0–99)
Total CHOL/HDL Ratio: 2.5 RATIO
Triglycerides: 64 mg/dL (ref <150)
VLDL: 13 mg/dL (ref 0–40)

## 2021-04-30 LAB — HEMOGLOBIN A1C
Hgb A1c MFr Bld: 5.8 % — ABNORMAL HIGH (ref 4.8–5.6)
Mean Plasma Glucose: 119.76 mg/dL

## 2021-04-30 MED ORDER — FINASTERIDE 5 MG PO TABS
5.0000 mg | ORAL_TABLET | Freq: Every day | ORAL | Status: DC
  Administered 2021-05-01: 5 mg via ORAL
  Filled 2021-04-30: qty 1

## 2021-04-30 MED ORDER — TAMSULOSIN HCL 0.4 MG PO CAPS
0.8000 mg | ORAL_CAPSULE | Freq: Every day | ORAL | Status: DC
  Administered 2021-04-30 – 2021-05-01 (×2): 0.8 mg via ORAL
  Filled 2021-04-30 (×2): qty 2

## 2021-04-30 MED ORDER — DOCUSATE SODIUM 100 MG PO CAPS
100.0000 mg | ORAL_CAPSULE | Freq: Two times a day (BID) | ORAL | Status: DC
  Administered 2021-04-30 – 2021-05-01 (×3): 100 mg via ORAL
  Filled 2021-04-30 (×3): qty 1

## 2021-04-30 MED ORDER — LATANOPROST 0.005 % OP SOLN
1.0000 [drp] | Freq: Every day | OPHTHALMIC | Status: DC
  Administered 2021-04-30: 22:00:00 1 [drp] via OPHTHALMIC
  Filled 2021-04-30: qty 2.5

## 2021-04-30 MED ORDER — VITAMIN B-12 1000 MCG PO TABS
1000.0000 ug | ORAL_TABLET | Freq: Every day | ORAL | Status: DC
  Administered 2021-04-30 – 2021-05-01 (×2): 1000 ug via ORAL
  Filled 2021-04-30 (×2): qty 1

## 2021-04-30 MED ORDER — FLUTICASONE FUROATE-VILANTEROL 200-25 MCG/ACT IN AEPB
1.0000 | INHALATION_SPRAY | Freq: Every day | RESPIRATORY_TRACT | Status: DC
  Administered 2021-04-30 – 2021-05-01 (×2): 1 via RESPIRATORY_TRACT
  Filled 2021-04-30: qty 28

## 2021-04-30 MED ORDER — ASPIRIN EC 325 MG PO TBEC
325.0000 mg | DELAYED_RELEASE_TABLET | Freq: Every day | ORAL | Status: DC
  Administered 2021-04-30 – 2021-05-01 (×2): 325 mg via ORAL
  Filled 2021-04-30 (×2): qty 1

## 2021-04-30 MED ORDER — TIMOLOL MALEATE 0.5 % OP SOLN
1.0000 [drp] | Freq: Every morning | OPHTHALMIC | Status: DC
  Administered 2021-04-30 – 2021-05-01 (×2): 1 [drp] via OPHTHALMIC
  Filled 2021-04-30: qty 5

## 2021-04-30 NOTE — Evaluation (Signed)
Occupational Therapy Evaluation Patient Details Name: Charles Golden MRN: [id number] DOB: 11-09-1925 Today's Date: 04/30/2021   History of Present Illness Pt is a 87 y.o. M who presents 04/29/2021 with aphasia and difficulty walking. MRI showing 2 areas of acute infarction within the left frontal lobe, the larger of which is in the insula. Neurology felt due to evolution of his previous left insular cortex infarct due to acute occlusion of the inferior division of the left M2 and likely chronic occlusion of left ICA due to thrombosis source. Significant PMH: stroke, BPH, COPD, CKD 3.   Clinical Impression   Charles Golden was mod I PTA and was the PCG to his now late wife. He lives alone in a 1 level home with 2 STE. Upon evaluation pt was min guard - supervision for all functional mobility and ADLs without use of AD. However educated pt and family of increased stability and safety with use of RW at d/c, they verbalized understanding. Pt will benefit from continued OT acutely to progress towards his mod I baseline. Recommend d/c home with supervision initially for ADLs and mobility.      Recommendations for follow up therapy are one component of a multi-disciplinary discharge planning process, led by the attending physician.  Recommendations may be updated based on patient status, additional functional criteria and insurance authorization.   Follow Up Recommendations  No OT follow up    Assistance Recommended at Discharge Intermittent Supervision/Assistance  Functional Status Assessment  Patient has had a recent decline in their functional status and demonstrates the ability to make significant improvements in function in a reasonable and predictable amount of time.  Equipment Recommendations  None recommended by OT    Recommendations for Other Services       Precautions / Restrictions Precautions Precautions: Fall Restrictions Weight Bearing Restrictions: No      Mobility Bed Mobility Overal  bed mobility: Modified Independent             General bed mobility comments: bed flat, no use of rails. +increased tome    Transfers Overall transfer level: Needs assistance Equipment used: None   Sit to Stand: Min guard           General transfer comment: min guard for safety upon standing; once up pt more steady      Balance Overall balance assessment: Needs assistance Sitting-balance support: No upper extremity supported Sitting balance-Leahy Scale: Good     Standing balance support: No upper extremity supported Standing balance-Leahy Scale: Good                             ADL either performed or assessed with clinical judgement   ADL Overall ADL's : Needs assistance/impaired Eating/Feeding: Independent;Sitting   Grooming: Min guard;Standing   Upper Body Bathing: Set up;Sitting   Lower Body Bathing: Min guard;Sit to/from stand   Upper Body Dressing : Set up;Sitting   Lower Body Dressing: Min guard;Sit to/from stand   Toilet Transfer: Min guard;Ambulation;Regular Toilet   Toileting- Clothing Manipulation and Hygiene: Supervision/safety;Sitting/lateral lean       Functional mobility during ADLs: Min guard General ADL Comments: min guard for safety throughout without a RW - educated on use of RW for all mobility to increase safety and indep at d/c     Vision Baseline Vision/History: 1 Wears glasses Ability to See in Adequate Light: 0 Adequate Patient Visual Report: No change from baseline Vision Assessment?: No apparent visual deficits  Perception     Praxis      Pertinent Vitals/Pain Pain Assessment: No/denies pain     Hand Dominance Right   Extremity/Trunk Assessment Upper Extremity Assessment Upper Extremity Assessment: Overall WFL for tasks assessed   Lower Extremity Assessment Lower Extremity Assessment: Defer to PT evaluation   Cervical / Trunk Assessment Cervical / Trunk Assessment: Normal   Communication  Communication Communication: HOH   Cognition Arousal/Alertness: Awake/alert Behavior During Therapy: WFL for tasks assessed/performed Overall Cognitive Status: Within Functional Limits for tasks assessed                                       General Comments  VSS on RA, daughters present and supportive    Exercises     Shoulder Instructions      Home Living Family/patient expects to be discharged to:: Private residence Living Arrangements: Alone Available Help at Discharge: Family;Available PRN/intermittently Type of Home: House Home Access: Stairs to enter [employer] of Steps: 2 Entrance Stairs-Rails: Right Home Layout: One level     Bathroom Shower/Tub: Occupational psychologist: Handicapped height     Home Equipment: Conservation officer, nature (2 wheels);Shower seat;Grab bars - tub/shower;Grab bars - toilet   Additional Comments: home equipment was his deceased wife's      Prior Functioning/Environment Prior Level of Function : Independent/Modified Independent               ADLs Comments: drives, was caregiver of his wife        OT Problem List: Impaired balance (sitting and/or standing)      OT Treatment/Interventions: Self-care/ADL training;DME and/or AE instruction;Patient/family education;Balance training;Therapeutic activities    OT Goals(Current goals can be found in the care plan section) Acute Rehab OT Goals Patient Stated Goal: to get some rest OT Goal Formulation: With patient Time For Goal Achievement: 05/12/21 Potential to Achieve Goals: Good ADL Goals Pt Will Perform Lower Body Bathing: with modified independence;sit to/from stand Pt Will Perform Lower Body Dressing: with modified independence;sit to/from stand Pt Will Transfer to Toilet: with modified independence;ambulating Additional ADL Goal #1: Will indep verbalize at least 3 fall prevention strategies to apply to his home setting  OT Frequency: Min  2X/week   Barriers to D/C: Decreased caregiver support  pt lives alone, daughters can assist and are supportive       Co-evaluation              AM-PAC OT "6 Clicks" Daily Activity     Outcome Measure Help from another person eating meals?: None Help from another person taking care of personal grooming?: A Little Help from another person toileting, which includes using toliet, bedpan, or urinal?: A Little Help from another person bathing (including washing, rinsing, drying)?: A Little Help from another person to put on and taking off regular upper body clothing?: None Help from another person to put on and taking off regular lower body clothing?: A Little 6 Click Score: 20   End of Session Nurse Communication: Mobility status  Activity Tolerance: Patient tolerated treatment well Patient left: in bed;with call bell/phone within reach;with family/visitor present  OT Visit Diagnosis: Unsteadiness on feet (R26.81);Other abnormalities of gait and mobility (R26.89)                Time: 3500-9381 OT Time Calculation (min): 12 min Charges:  OT General Charges $OT Visit: 1 Visit OT Evaluation $OT  Eval Moderate Complexity: 1 Mod  Mariette Cowley A Yoshika Vensel 04/30/2021, 2:01 PM

## 2021-04-30 NOTE — Progress Notes (Signed)
PROGRESS NOTE    Charles Golden  NAT:[id number] DOB: 1926-02-14 DOA: 04/29/2021 PCP: [name], DO   Chief Complaint  Patient presents with   Code Stroke    Brief Narrative:   Charles Golden is a 87 y.o. male with medical history of acute ischemic stroke, BPH, COPD, CKD 3, GERD presenting with word finding difficulties.  Notably, the patient was recently admitted to the hospital from 04/27/2021 to 04/28/21 secondary to ischemic stroke.  MRI at that time showed an ischemic focus in the left insular region.  CTA of the head and neck on 04/27/2021 showed an occlusion of the left proximal ICA.  Neurology followed the patient and did not recommend any further intervention regarding the patient's left internal carotid.  The patient was sent home with aspirin and Plavix for 3 months, then aspirin monotherapy thereafter -Patient presents to [facility] due to complaints of word finding difficulty and stumbling, he was transferred to [facility] for further work-up for acute CVA.  Assessment & Plan:   Principal Problem:   Acute ischemic stroke [facility])   Acute CVA -Neurology input greatly appreciated, MRI of the brain showing 2 areas of acute infarction within the left frontal lobe, the larger of which is in the insula. -And had recent work-up including 2D echo with normal EF 60 to 65%, LDL was at 50, hemoglobin A1c was at 5.8 -Neurology input appreciated, it was felt due to evolution of his previous left insular cortex infarct due to acute occlusion of the inferior division of the left M2 and likely chronic occlusion of left ICA due to thrombosis source. -Need PT/OT/SLP to reevaluate, for now we will continue with aspirin and Plavix. -I have discussed with neurology, they want the patient to stay overnight to monitor for development of any new symptoms.  BPH -Holding tamsulosin per neurology recommendation   CKD stage IIIb -Baseline creatinine 1.5-1.7   Impaired glucose  tolerance -Hemoglobin A1c 6.0 as discussed above   COPD/bronchiectasis -Stable on room air   GERD -Continue pantoprazole     DVT prophylaxis: Lovenox Code Status: Full Family Communication: left  daughter Chrys Racer a Advertising account executive. Disposition:   Status is: Inpatient  Remains inpatient appropriate because: will need further monitoring for CVA       Consultants:  Neurology  Subjective:  Patient denies any new complaints, he denies any new deficits Objective: Vitals:   04/30/21 0312 04/30/21 0345 04/30/21 0738 04/30/21 1221  BP:  137/76 137/71 135/88  Pulse: 64 63 65 64  Resp: 17 16 16 20   Temp:  98.1 F (36.7 C) 97.9 F (36.6 C) 97.9 F (36.6 C)  TempSrc:  Oral Oral Oral  SpO2: 96% 97% 96% 95%  Weight:      Height:        Intake/Output Summary (Last 24 hours) at 04/30/2021 1244 Last data filed at 04/30/2021 0800 Gross per 24 hour  Intake 480 ml  Output 500 ml  Net -20 ml   Filed Weights   04/29/21 0952  Weight: 77.9 kg    Examination:  General exam: Appears calm and comfortable  Respiratory system: Clear to auscultation. Respiratory effort normal. Cardiovascular system: S1 & S2 heard, RRR. No JVD, murmurs, rubs, gallops or clicks. No pedal edema. Gastrointestinal system: Abdomen is nondistended, soft and nontender. No organomegaly or masses felt. Normal bowel sounds heard. Central nervous system: Alert and oriented. No focal neurological deficits. Extremities: Symmetric 5 x 5 power. Skin: No rashes, lesions or ulcers  Psychiatry: Judgement and insight appear normal. Mood & affect appropriate.     Data Reviewed: I have personally reviewed following labs and imaging studies  CBC: Recent Labs  Lab 04/27/21 1826 04/27/21 1846 04/29/21 0902  WBC 9.6  --  7.7  NEUTROABS 6.1  --  5.3  HGB 14.4 15.0 14.4  HCT 44.1 44.0 44.4  MCV 95.9  --  96.7  PLT 280  --  606    Basic Metabolic Panel: Recent Labs  Lab 04/27/21 1826 04/27/21 1846  04/29/21 0902  NA 138 139 138  K 4.6 4.6 4.5  CL 107 105 106  CO2 24  --  24  GLUCOSE 94 92 103*  BUN 23 25* 26*  CREATININE 1.66* 1.70* 1.58*  CALCIUM 8.8*  --  8.7*    GFR: Estimated Creatinine Clearance: 30.2 mL/min (A) (by C-G formula based on SCr of 1.58 mg/dL (H)).  Liver Function Tests: Recent Labs  Lab 04/27/21 1826 04/29/21 0902  AST 23 25  ALT 12 14  ALKPHOS 67 75  BILITOT 0.7 0.9  PROT 6.0* 6.4*  ALBUMIN 3.5 3.6    CBG: Recent Labs  Lab 04/28/21 0825  GLUCAP 84     Recent Results (from the past 240 hour(s))  Resp Panel by RT-PCR (Flu A&B, Covid) Nasopharyngeal Swab     Status: None   Collection Time: 04/29/21  9:02 AM   Specimen: Nasopharyngeal Swab; Nasopharyngeal(NP) swabs in vial transport medium  Result Value Ref Range Status   SARS Coronavirus 2 by RT PCR NEGATIVE NEGATIVE Final    Comment: (NOTE) SARS-CoV-2 target nucleic acids are NOT DETECTED.  The SARS-CoV-2 RNA is generally detectable in upper respiratory specimens during the acute phase of infection. The lowest concentration of SARS-CoV-2 viral copies this assay can detect is 138 copies/mL. A negative result does not preclude SARS-Cov-2 infection and should not be used as the sole basis for treatment or other patient management decisions. A negative result may occur with  improper specimen collection/handling, submission of specimen other than nasopharyngeal swab, presence of viral mutation(s) within the areas targeted by this assay, and inadequate number of viral copies(<138 copies/mL). A negative result must be combined with clinical observations, patient history, and epidemiological information. The expected result is Negative.  Fact Sheet for Patients:  EntrepreneurPulse.com.au  Fact Sheet for Healthcare Providers:  IncredibleEmployment.be  This test is no t yet approved or cleared by the [place] FDA and  has been authorized for  detection and/or diagnosis of SARS-CoV-2 by FDA under an Emergency Use Authorization (EUA). This EUA will remain  in effect (meaning this test can be used) for the duration of the COVID-19 declaration under Section 564(b)(1) of the Act, 21 U.S.C.section 360bbb-3(b)(1), unless the authorization is terminated  or revoked sooner.       Influenza A by PCR NEGATIVE NEGATIVE Final   Influenza B by PCR NEGATIVE NEGATIVE Final    Comment: (NOTE) The Xpert Xpress SARS-CoV-2/FLU/RSV plus assay is intended as an aid in the diagnosis of influenza from Nasopharyngeal swab specimens and should not be used as a sole basis for treatment. Nasal washings and aspirates are unacceptable for Xpert Xpress SARS-CoV-2/FLU/RSV testing.  Fact Sheet for Patients: EntrepreneurPulse.com.au  Fact Sheet for Healthcare Providers: IncredibleEmployment.be  This test is not yet approved or cleared by the [place] FDA and has been authorized for detection and/or diagnosis of SARS-CoV-2 by FDA under an Emergency Use Authorization (EUA). This EUA will remain in effect (meaning this test  can be used) for the duration of the COVID-19 declaration under Section 564(b)(1) of the Act, 21 U.S.C. section 360bbb-3(b)(1), unless the authorization is terminated or revoked.  Performed at [facility], [address], [place], [place] [address]          Radiology Studies: MR BRAIN WO CONTRAST  Result Date: 04/29/2021 CLINICAL DATA:  Acute neurologic deficit EXAM: MRI HEAD WITHOUT CONTRAST TECHNIQUE: Multiplanar, multiecho pulse sequences of the brain and surrounding structures were obtained without intravenous contrast. COMPARISON:  None. FINDINGS: Brain: There are 2 areas of acute infarction within the left frontal lobe, the larger of which is in the insula. No acute hemorrhage. Normal white matter signal. Generalized volume loss without a clear lobar predilection. Focus of chronic  microhemorrhage in the right cerebellum the midline structures are normal. Vascular: Magnetic susceptibility effect within the left MCA likely indicates a site of thrombus. Skull and upper cervical spine: Normal calvarium and skull base. Visualized upper cervical spine and soft tissues are normal. Sinuses/Orbits:No paranasal sinus fluid levels or advanced mucosal thickening. No mastoid or middle ear effusion. Normal orbits. IMPRESSION: 1. Two areas of acute infarction within the left frontal lobe, the larger of which is in the insula. No hemorrhage or mass effect. 2. Magnetic susceptibility effect within the left MCA likely indicates a site of thrombus. Electronically Signed   By: Ulyses Jarred M.D.   On: 04/29/2021 19:16   CT HEAD CODE STROKE WO CONTRAST`  Result Date: 04/29/2021 CLINICAL DATA:  Code stroke. 87 year old male with aphasia. Occluded left internal carotid artery and left MCA M2 branches on CTA 2 days ago. EXAM: CT HEAD WITHOUT CONTRAST TECHNIQUE: Contiguous axial images were obtained from the base of the skull through the vertex without intravenous contrast. COMPARISON:  [facility] CT head, CTA head and neck 04/27/2021. Brain MRI yesterday. FINDINGS: Brain: Small area of cytotoxic edema in the left insula corresponding to abnormal diffusion yesterday. Nearby hyperdense left MCA M2 branch in the sylvian fissure persist. But elsewhere gray-white matter differentiation remains stable since 04/27/2021. No acute intracranial hemorrhage identified. No midline shift, mass effect, or evidence of intracranial mass lesion. No ventriculomegaly. Vascular: Calcified atherosclerosis at the skull base. Persistent hyperdense left MCA M2 branch. Skull: No acute osseous abnormality identified. Sinuses/Orbits: Visualized paranasal sinuses and mastoids are stable and well aerated. Other: No acute orbit or scalp soft tissue finding. ASPECTS [facility] Stroke Program Early CT Score) Total score (0-10 with 10  being normal): 9 (abnormal left insula as seen by MRI yesterday). IMPRESSION: 1. Expected CT appearance of the small left insular infarct on MRI yesterday. Elsewhere stable gray-white matter differentiation since the CT on 04/27/2021. ASPECTS 9. 2. No intracranial hemorrhage or mass effect. Study discussed by telephone with Dr. Davonna Belling on 04/29/2021 at 09:19 . Electronically Signed   By: Genevie Ann M.D.   On: 04/29/2021 09:20   CT ANGIO HEAD NECK W WO CM W PERF (CODE STROKE)  Result Date: 04/29/2021 CLINICAL DATA:  Neuro deficit, acute, stroke suspected EXAM: CT ANGIOGRAPHY HEAD AND NECK CT PERFUSION BRAIN TECHNIQUE: Multidetector CT imaging of the head and neck was performed using the standard protocol during bolus administration of intravenous contrast. Multiplanar CT image reconstructions and MIPs were obtained to evaluate the vascular anatomy. Carotid stenosis measurements (when applicable) are obtained utilizing NASCET criteria, using the distal internal carotid diameter as the denominator. Multiphase CT imaging of the brain was performed following IV bolus contrast injection. Subsequent parametric perfusion maps were calculated using  RAPID software. CONTRAST:  193mL OMNIPAQUE IOHEXOL 350 MG/ML SOLN COMPARISON:  Recent CTA and MRI brain FINDINGS: CTA NECK Aortic arch: Calcified plaque along the arch and patent great vessel origins. Right carotid system: Patent. Stable appearance. No hemodynamically significant stenosis at the ICA origin. Left carotid system: Patent common carotid. There has been some recanalization of the internal carotid artery. Persistent mixed plaque including irregular noncalcified plaque along the proximal ICA with superimposed thrombus. There is focal near occlusion with submillimeter residual lumen. Vessel caliber remains small reflecting flow limitation. Vertebral arteries: Patent.  Stable appearance. Skeleton: Stable advanced cervical spine degenerative changes. Other neck: No  new abnormality. Upper chest: Emphysema.  No new abnormality. Review of the MIP images confirms the above findings CTA HEAD Anterior circulation: As in the neck, there is no flow present within the left ICA. Left M1 MCA is patent. Persistent left M2 MCA occlusion although now slightly more distal. There is some reconstitution at the M2-M3 junction as before. Right middle and both anterior cerebral arteries are patent. Posterior circulation: Intracranial vertebral arteries are patent. Basilar artery is patent. Major cerebellar artery origins are patent. Posterior cerebral arteries are patent. Venous sinuses: Patent as allowed by contrast bolus timing. Review of the MIP images confirms the above findings CT Brain Perfusion Findings: CBF (<30%) Volume: 77mL Perfusion (Tmax>6.0s) volume: 46mL Mismatch Volume: 12mL Infarction Location: Left MCA territory IMPRESSION: Interval partial recanalization of the cervical left ICA. Remains focal near occlusion with submillimeter lumen and decreased distal vessel caliber reflecting flow limitation. Intracranially, the left ICA is now patent. There is a persistent left M2 MCA occlusion, but the occlusion now occurs more distally within the sylvian fissure. Some distal reconstitution is present. Perfusion imaging demonstrates no core infarction and 83 mL of penumbra in the left MCA territory. Electronically Signed   By: Macy Mis M.D.   On: 04/29/2021 10:01        Scheduled Meds:   stroke: mapping our early stages of recovery book   Does not apply Once   aspirin  325 mg Oral Daily   atorvastatin  40 mg Oral q1800   clopidogrel  75 mg Oral Daily   enoxaparin (LOVENOX) injection  40 mg Subcutaneous Daily   fluticasone furoate-vilanterol  1 puff Inhalation Daily   latanoprost  1 drop Left Eye QHS   pantoprazole  40 mg Oral Daily   timolol  1 drop Left Eye q morning   vitamin B-12  1,000 mcg Oral Daily   Continuous Infusions:  sodium chloride Stopped (04/29/21  1700)     LOS: 1 day       Phillips Climes, MD [facility]   To contact the attending provider between 7A-7P or the covering provider during after hours 7P-7A, please log into the web site [email or web address] and access using universal [place] password for that web site. If you do not have the password, please call the hospital operator.  04/30/2021, 12:44 PM

## 2021-04-30 NOTE — Progress Notes (Addendum)
Brief Note  NIR consulted regarding possible stenting for left carotid occlusion.   Charles Golden was admitted 12/1 as a code stroke with complete left carotid occlusion which is now partially recanalized.  Stopped by patient room today where he was alert in bed, appearing comfortable, and chatting with two daughters at bedside.  Discussed that Dr. Karenann Cai will come by tomorrow to provide information about possible intervention she can offer, which would likely be scheduled at end of week once Plavix has had an opportunity to have therapeutic effect on any remaining thrombus.  Family is looking forward to the discussion and would like to be present as Charles Golden has decreased hearing.  [name], his eldest daughter, plans to be in his room tomorrow starting around 9am.  They are overall pleased with how Charles Golden is doing, stating his only remaining deficit is some unilateral weakness with ambulation.   The family did express they are currently sad, as Charles Golden wife of 83 years passed away on 08-25-22.   Electronically Signed: Pasty Spillers, PA 04/30/2021, 1:17 PM

## 2021-04-30 NOTE — Evaluation (Signed)
Physical Therapy Evaluation Patient Details Name: Charles Golden MRN: [id number] DOB: 1926-01-13 Today's Date: 04/30/2021  History of Present Illness  Pt is a 87 y.o. M who presents 04/29/2021 with aphasia and difficulty walking. MRI showing 2 areas of acute infarction within the left frontal lobe, the larger of which is in the insula. Neurology felt due to evolution of his previous left insular cortex infarct due to acute occlusion of the inferior division of the left M2 and likely chronic occlusion of left ICA due to thrombosis source. Significant PMH: stroke, BPH, COPD, CKD 3.  Clinical Impression  Pt admitted with above. Pt does not appear to have focal deficits. Overall, he is moving fairly well. Ambulating 300 feet with no assistive device at a min guard assist level. Demonstrates mild balance deficits and decreased endurance, but is likely fairly close to his functional baseline. Will continue to follow acutely. Don't anticipate need for PT follow up.      Recommendations for follow up therapy are one component of a multi-disciplinary discharge planning process, led by the attending physician.  Recommendations may be updated based on patient status, additional functional criteria and insurance authorization.  Follow Up Recommendations No PT follow up    Assistance Recommended at Discharge Intermittent Supervision/Assistance  Functional Status Assessment Patient has had a recent decline in their functional status and demonstrates the ability to make significant improvements in function in a reasonable and predictable amount of time.   Equipment Recommendations  None recommended by PT    Recommendations for Other Services       Precautions / Restrictions Precautions Precautions: Fall Restrictions Weight Bearing Restrictions: No      Mobility  Bed Mobility Overal bed mobility: Modified Independent                  Transfers Overall transfer level: Needs  assistance Equipment used: None   Sit to Stand: Supervision                Ambulation/Gait Ambulation/Gait assistance: Min guard Gait Distance (Feet): 300 Feet Assistive device: None Gait Pattern/deviations: Step-through pattern;Decreased stride length Gait velocity: decreased     General Gait Details: Mild dynamic instability, requiring min guard assist.  Stairs            Wheelchair Mobility    Modified Rankin (Stroke Patients Only) Modified Rankin (Stroke Patients Only) Pre-Morbid Rankin Score: No significant disability Modified Rankin: Moderately severe disability     Balance Overall balance assessment: Needs assistance Sitting-balance support: No upper extremity supported Sitting balance-Leahy Scale: Good     Standing balance support: No upper extremity supported Standing balance-Leahy Scale: Good                               Pertinent Vitals/Pain Pain Assessment: No/denies pain    Home Living Family/patient expects to be discharged to:: Private residence Living Arrangements: Alone Available Help at Discharge: Family;Available PRN/intermittently Type of Home: House Home Access: Stairs to enter Entrance Stairs-Rails: Right Entrance Stairs-Number of Steps: 2   Home Layout: One level Home Equipment: Conservation officer, nature (2 wheels);Shower seat;Grab bars - tub/shower;Grab bars - toilet Additional Comments: home equipment was his deceased wife's    Prior Function Prior Level of Function : Independent/Modified Independent                     Hand Dominance   Dominant Hand: Right    Extremity/Trunk Assessment  Upper Extremity Assessment Upper Extremity Assessment: Defer to OT evaluation    Lower Extremity Assessment Lower Extremity Assessment: Overall WFL for tasks assessed    Cervical / Trunk Assessment Cervical / Trunk Assessment: Normal  Communication   Communication: HOH  Cognition Arousal/Alertness:  Awake/alert Behavior During Therapy: WFL for tasks assessed/performed Overall Cognitive Status: Within Functional Limits for tasks assessed                                          General Comments      Exercises     Assessment/Plan    PT Assessment Patient needs continued PT services  PT Problem List Decreased strength;Decreased balance;Decreased mobility;Decreased knowledge of use of DME       PT Treatment Interventions DME instruction;Gait training;Stair training;Functional mobility training;Therapeutic activities;Therapeutic exercise;Balance training;Patient/family education    PT Goals (Current goals can be found in the Care Plan section)  Acute Rehab PT Goals Patient Stated Goal: to go home PT Goal Formulation: With patient Time For Goal Achievement: 05/14/21 Potential to Achieve Goals: Good    Frequency Min 4X/week   Barriers to discharge        Co-evaluation               AM-PAC PT "6 Clicks" Mobility  Outcome Measure Help needed turning from your back to your side while in a flat bed without using bedrails?: None Help needed moving from lying on your back to sitting on the side of a flat bed without using bedrails?: None Help needed moving to and from a bed to a chair (including a wheelchair)?: A Little Help needed standing up from a chair using your arms (e.g., wheelchair or bedside chair)?: A Little Help needed to walk in hospital room?: A Little Help needed climbing 3-5 steps with a railing? : A Little 6 Click Score: 20    End of Session Equipment Utilized During Treatment: Gait belt Activity Tolerance: Patient tolerated treatment well Patient left: in chair;with call bell/phone within reach;with chair alarm set Nurse Communication: Mobility status PT Visit Diagnosis: Unsteadiness on feet (R26.81)    Time: 1010-1033 PT Time Calculation (min) (ACUTE ONLY): 23 min   Charges:   PT Evaluation $PT Eval Moderate Complexity: 1  Mod PT Treatments $Therapeutic Activity: 8-22 mins        Wyona Almas, PT, DPT Acute Rehabilitation Services Pager [phone] Office [phone]   Deno Etienne 04/30/2021, 1:34 PM

## 2021-04-30 NOTE — Plan of Care (Signed)
Discussed with patient plan of care for the evening, pain management and stroke scale with some teach back displayed.  Daughters were present and wanted to know results of MRI.

## 2021-04-30 NOTE — Progress Notes (Addendum)
STROKE TEAM PROGRESS NOTE   INTERVAL HISTORY No family is at the bedside. Patient was discharged on 12/2 after a similar episode after his wife's funeral service. He was discharged on 325 and Plavix 75 DAPT for 3 months and then aspirin alone.  Added statin for stroke prevention.  Avoid low BP, BP goal 130-150 given left ICA occlusion and he was to follow-up with Dr. Jaynee Eagles on 06/22/21. He awoke around 7am and his daughter noticed that he was having difficulty walking and speaking. He was brought to [facility] and had a teleconsult and then was transferred to [facility] for further imaging and workup.  Discuss possible stent next week with IR  Vitals:   04/30/21 0300 04/30/21 0312 04/30/21 0345 04/30/21 0738  BP:   137/76 137/71  Pulse: (!) 58 64 63 65  Resp: 15 17 16 16   Temp:   98.1 F (36.7 C) 97.9 F (36.6 C)  TempSrc:   Oral Oral  SpO2: 96% 96% 97% 96%  Weight:      Height:       CBC:  Recent Labs  Lab 04/27/21 1826 04/27/21 1846 04/29/21 0902  WBC 9.6  --  7.7  NEUTROABS 6.1  --  5.3  HGB 14.4 15.0 14.4  HCT 44.1 44.0 44.4  MCV 95.9  --  96.7  PLT 280  --  782   Basic Metabolic Panel:  Recent Labs  Lab 04/27/21 1826 04/27/21 1846 04/29/21 0902  NA 138 139 138  K 4.6 4.6 4.5  CL 107 105 106  CO2 24  --  24  GLUCOSE 94 92 103*  BUN 23 25* 26*  CREATININE 1.66* 1.70* 1.58*  CALCIUM 8.8*  --  8.7*   Lipid Panel:  Recent Labs  Lab 04/30/21 0126  CHOL 105  TRIG 64  HDL 42  CHOLHDL 2.5  VLDL 13  LDLCALC 50   HgbA1c:  Recent Labs  Lab 04/30/21 0126  HGBA1C 5.8*   Urine Drug Screen:  Recent Labs  Lab 04/29/21 1210  LABOPIA NONE DETECTED  COCAINSCRNUR NONE DETECTED  LABBENZ NONE DETECTED  AMPHETMU NONE DETECTED  THCU NONE DETECTED  LABBARB NONE DETECTED    Alcohol Level  Recent Labs  Lab 04/29/21 0902  ETH <10    IMAGING past 24 hours MR BRAIN WO CONTRAST  Result Date: 04/29/2021 CLINICAL DATA:  Acute neurologic deficit EXAM: MRI HEAD WITHOUT  CONTRAST TECHNIQUE: Multiplanar, multiecho pulse sequences of the brain and surrounding structures were obtained without intravenous contrast. COMPARISON:  None. FINDINGS: Brain: There are 2 areas of acute infarction within the left frontal lobe, the larger of which is in the insula. No acute hemorrhage. Normal white matter signal. Generalized volume loss without a clear lobar predilection. Focus of chronic microhemorrhage in the right cerebellum the midline structures are normal. Vascular: Magnetic susceptibility effect within the left MCA likely indicates a site of thrombus. Skull and upper cervical spine: Normal calvarium and skull base. Visualized upper cervical spine and soft tissues are normal. Sinuses/Orbits:No paranasal sinus fluid levels or advanced mucosal thickening. No mastoid or middle ear effusion. Normal orbits. IMPRESSION: 1. Two areas of acute infarction within the left frontal lobe, the larger of which is in the insula. No hemorrhage or mass effect. 2. Magnetic susceptibility effect within the left MCA likely indicates a site of thrombus. Electronically Signed   By: Ulyses Jarred M.D.   On: 04/29/2021 19:16   CT HEAD CODE STROKE WO CONTRAST`  Result Date: 04/29/2021 CLINICAL DATA:  Code stroke. 87 year old male with aphasia. Occluded left internal carotid artery and left MCA M2 branches on CTA 2 days ago. EXAM: CT HEAD WITHOUT CONTRAST TECHNIQUE: Contiguous axial images were obtained from the base of the skull through the vertex without intravenous contrast. COMPARISON:  [place] [facility] CT head, CTA head and neck 04/27/2021. Brain MRI yesterday. FINDINGS: Brain: Small area of cytotoxic edema in the left insula corresponding to abnormal diffusion yesterday. Nearby hyperdense left MCA M2 branch in the sylvian fissure persist. But elsewhere gray-white matter differentiation remains stable since 04/27/2021. No acute intracranial hemorrhage identified. No midline shift, mass effect, or evidence  of intracranial mass lesion. No ventriculomegaly. Vascular: Calcified atherosclerosis at the skull base. Persistent hyperdense left MCA M2 branch. Skull: No acute osseous abnormality identified. Sinuses/Orbits: Visualized paranasal sinuses and mastoids are stable and well aerated. Other: No acute orbit or scalp soft tissue finding. ASPECTS [facility] Stroke Program Early CT Score) Total score (0-10 with 10 being normal): 9 (abnormal left insula as seen by MRI yesterday). IMPRESSION: 1. Expected CT appearance of the small left insular infarct on MRI yesterday. Elsewhere stable gray-white matter differentiation since the CT on 04/27/2021. ASPECTS 9. 2. No intracranial hemorrhage or mass effect. Study discussed by telephone with Dr. Davonna Belling on 04/29/2021 at 09:19 . Electronically Signed   By: Genevie Ann M.D.   On: 04/29/2021 09:20   CT ANGIO HEAD NECK W WO CM W PERF (CODE STROKE)  Result Date: 04/29/2021 CLINICAL DATA:  Neuro deficit, acute, stroke suspected EXAM: CT ANGIOGRAPHY HEAD AND NECK CT PERFUSION BRAIN TECHNIQUE: Multidetector CT imaging of the head and neck was performed using the standard protocol during bolus administration of intravenous contrast. Multiplanar CT image reconstructions and MIPs were obtained to evaluate the vascular anatomy. Carotid stenosis measurements (when applicable) are obtained utilizing NASCET criteria, using the distal internal carotid diameter as the denominator. Multiphase CT imaging of the brain was performed following IV bolus contrast injection. Subsequent parametric perfusion maps were calculated using RAPID software. CONTRAST:  146mL OMNIPAQUE IOHEXOL 350 MG/ML SOLN COMPARISON:  Recent CTA and MRI brain FINDINGS: CTA NECK Aortic arch: Calcified plaque along the arch and patent great vessel origins. Right carotid system: Patent. Stable appearance. No hemodynamically significant stenosis at the ICA origin. Left carotid system: Patent common carotid. There has been some  recanalization of the internal carotid artery. Persistent mixed plaque including irregular noncalcified plaque along the proximal ICA with superimposed thrombus. There is focal near occlusion with submillimeter residual lumen. Vessel caliber remains small reflecting flow limitation. Vertebral arteries: Patent.  Stable appearance. Skeleton: Stable advanced cervical spine degenerative changes. Other neck: No new abnormality. Upper chest: Emphysema.  No new abnormality. Review of the MIP images confirms the above findings CTA HEAD Anterior circulation: As in the neck, there is no flow present within the left ICA. Left M1 MCA is patent. Persistent left M2 MCA occlusion although now slightly more distal. There is some reconstitution at the M2-M3 junction as before. Right middle and both anterior cerebral arteries are patent. Posterior circulation: Intracranial vertebral arteries are patent. Basilar artery is patent. Major cerebellar artery origins are patent. Posterior cerebral arteries are patent. Venous sinuses: Patent as allowed by contrast bolus timing. Review of the MIP images confirms the above findings CT Brain Perfusion Findings: CBF (<30%) Volume: 73mL Perfusion (Tmax>6.0s) volume: 82mL Mismatch Volume: 26mL Infarction Location: Left MCA territory IMPRESSION: Interval partial recanalization of the cervical left ICA. Remains focal near occlusion with submillimeter lumen and decreased distal  vessel caliber reflecting flow limitation. Intracranially, the left ICA is now patent. There is a persistent left M2 MCA occlusion, but the occlusion now occurs more distally within the sylvian fissure. Some distal reconstitution is present. Perfusion imaging demonstrates no core infarction and 83 mL of penumbra in the left MCA territory. Electronically Signed   By: Macy Mis M.D.   On: 04/29/2021 10:01    PHYSICAL EXAM General - Well nourished, well developed, in no apparent distress.   Ophthalmologic - fundi not  visualized due to noncooperation.   Cardiovascular - Regular rhythm and rate.   Mental Status -  Level of arousal and orientation to time, place, and person were intact. Language including expression, naming, repetition, comprehension was assessed and found intact. Attention span and concentration were normal. Recent and remote memory were intact. Fund of Knowledge was assessed and was intact.   Cranial Nerves II - XII - II - Visual field intact OU. III, IV, VI - Extraocular movements intact. V - Facial sensation intact bilaterally. VII - Facial movement intact bilaterally. VIII - Hearing & vestibular intact bilaterally. X - Palate elevates symmetrically. XI - Chin turning & shoulder shrug intact bilaterally. XII - Tongue protrusion intact.   Motor Strength - [name] strength was normal in all extremities and pronator drift was absent.  Bulk was normal and fasciculations were absent.   Motor Tone - Muscle tone was assessed at the neck and appendages and was normal.   Reflexes - [name] reflexes were symmetrical in all extremities and he had no pathological reflexes.   Sensory - Light touch, temperature/pinprick were assessed and were symmetrical.     Coordination - The patient had normal movements in the hands and feet with no ataxia or dysmetria.  Tremor was absent.   Gait and Station - deferred. PT/OT to see patient    ASSESSMENT/PLAN Charles Golden is a 87 y.o. male with history of  history of BPH, COP, CKD stage IIIb, and recent stroke on 04/27/2021 presenting with another episode of aphasia and unsteady gait. He awoke around 7am and his daughter noticed that he was having difficulty walking and speaking. He was brought to [facility] and had a teleconsult and then was transferred to [facility] for further imaging and workup.  Stroke:  evolution/enlargement of previous left insular cortex infarct due to acute occlusion of the inferior division of the left M2 and likely  chronic occlusion of left ICA due to thrombosis source Code Stroke CT head Small area of cytotoxic edema in the left insula  corresponding to abnormal diffusion yesterday. Nearby hyperdense left MCA M2 branch in the sylvian fissure persist. ASPECTS 9 (abnormal left insula).  Expected CT appearance of the small left insular infarct on MRI yesterday. Elsewhere stable gray-white matter differentiation since the CT on 04/27/2021.  CTA head & neck Interval partial recanalization of the cervical left ICA. Remains focal near occlusion with submillimeter lumen and decreased distal vessel caliber reflecting flow limitation. Intracranially, the left ICA is now patent. There is a persistent left M2 MCA occlusion, but the occlusion now occurs more distally within the sylvian fissure. Some distal reconstitution is present.  Perfusion imaging demonstrates no core infarction and 83 mL of penumbra in the left MCA territory. MRI  Two areas of acute infarction within the left frontal lobe, the larger of which is in the insula. Magnetic susceptibility effect within the left MCA likely indicates a site of thrombus. 2D Echo Normal left ventricular function, EF 60-65%  LDL 50 HgbA1c 5.8 VTE prophylaxis - lovenox 40mg     Diet   Diet Heart Room service appropriate? Yes; Fluid consistency: Thin   aspirin 325 mg daily and clopidogrel 75 mg daily prior to admission, now on aspirin 325 mg daily and clopidogrel 75 mg daily. Continue prior medications Therapy recommendations:  pending Disposition:  Pending, possible d/c home 12/5  Hypertension Stable Avoid low BP, long term goal 130-150 given ICA occlusion  Hyperlipidemia Home meds:  Atorvastatin 40mg , resumed in hospital LDL 50, goal < 70 Continue statin at discharge  Diabetes type II Controlled HgbA1c 5.8, goal < 7.0 CBGs Recent Labs    04/28/21 0825  GLUCAP 84    SSI  Other Stroke Risk Factors Advanced Age >/= 16  Hx stroke/TIA Migraines- patient does  report using Excedrin migraine for headaches   Other Active Problems COPD Continue home bronchodilator BPH Resume home medication- flomax CKD IIIb- at baseline function Monitor renal function  Hospital day # 1  Patient seen and examined by NP/APP with MD. MD to update note as needed.   Janine Ores, DNP, FNP-BC [facility] Pager: [phone]  ATTENDING ATTESTATION:  This is a patient who presented outside facility and transferred here for stroke evaluation.  He was recently admitted for stroke and  one day after discharge had recurrence of symptoms of difficult difficulty speech and imbalance.  His repeat MRI when I compared it to the previous MRI shows a slight progression of his left insular stroke.  And on CTA initially he had a left carotid complete occlusion and now there appears to be some reconstitution and flow.  I discussed the findings with Dr. Debbrah Alar the interventional radiologist and she thinks she may be able to offer stenting in a week, too risky during this hospitalization.  She will come tomorrow to evaluate and and possibly offer stenting.  I had a long discussion with the patient's daughters who arrived later in the afternoon updated him on his condition.  I discussed that sometimes there can be a natural progression of the stroke however he did not have a new stroke or a new area.  No new changes in medications.  He mentions sometimes he would strain when using the bathroom.  Informed them that he should take it easy.  Recommended Colace  to soften his stools.  Discussed avoiding low blood pressure and recommended that he stay hydrated.  Recommend switching his Flomax to a.m.  All other questions were answered to their satisfaction.  I discussed that should he have recurrence of symptoms that they should bring him back to the hospital.  Dr. Reeves Forth evaluated pt independently, reviewed imaging, chart, labs. Discussed and formulated plan with the APP. Please  see APP note above for details.   Total 30 minutes spent on counseling patient and coordinating care, writing notes and reviewing chart.   Fabian Coca,MD    To contact Stroke Continuity provider, please refer to [email or web address] After hours, contact General Neurology

## 2021-05-01 ENCOUNTER — Emergency Department (HOSPITAL_COMMUNITY): Payer: Medicare Other

## 2021-05-01 ENCOUNTER — Encounter (HOSPITAL_COMMUNITY): Payer: Self-pay | Admitting: Emergency Medicine

## 2021-05-01 ENCOUNTER — Other Ambulatory Visit: Payer: Self-pay

## 2021-05-01 ENCOUNTER — Inpatient Hospital Stay (HOSPITAL_COMMUNITY)
Admission: EM | Admit: 2021-05-01 | Discharge: 2021-05-06 | DRG: 034 | Disposition: A | Discharge status: Home Health | Payer: Medicare Other | Attending: Internal Medicine | Admitting: Internal Medicine

## 2021-05-01 DIAGNOSIS — I6522 Occlusion and stenosis of left carotid artery: Secondary | ICD-10-CM

## 2021-05-01 DIAGNOSIS — Z888 Allergy status to other drugs, medicaments and biological substances status: Secondary | ICD-10-CM | POA: Diagnosis not present

## 2021-05-01 DIAGNOSIS — R9431 Abnormal electrocardiogram [ECG] [EKG]: Secondary | ICD-10-CM | POA: Diagnosis not present

## 2021-05-01 DIAGNOSIS — E43 Unspecified severe protein-calorie malnutrition: Secondary | ICD-10-CM | POA: Diagnosis present

## 2021-05-01 DIAGNOSIS — K219 Gastro-esophageal reflux disease without esophagitis: Secondary | ICD-10-CM | POA: Diagnosis present

## 2021-05-01 DIAGNOSIS — H9190 Unspecified hearing loss, unspecified ear: Secondary | ICD-10-CM | POA: Diagnosis present

## 2021-05-01 DIAGNOSIS — I63232 Cerebral infarction due to unspecified occlusion or stenosis of left carotid arteries: Secondary | ICD-10-CM

## 2021-05-01 DIAGNOSIS — Z6823 Body mass index (BMI) 23.0-23.9, adult: Secondary | ICD-10-CM

## 2021-05-01 DIAGNOSIS — H409 Unspecified glaucoma: Secondary | ICD-10-CM | POA: Diagnosis present

## 2021-05-01 DIAGNOSIS — R338 Other retention of urine: Secondary | ICD-10-CM | POA: Diagnosis present

## 2021-05-01 DIAGNOSIS — K581 Irritable bowel syndrome with constipation: Secondary | ICD-10-CM | POA: Diagnosis present

## 2021-05-01 DIAGNOSIS — E1122 Type 2 diabetes mellitus with diabetic chronic kidney disease: Secondary | ICD-10-CM | POA: Diagnosis present

## 2021-05-01 DIAGNOSIS — R5381 Other malaise: Secondary | ICD-10-CM | POA: Diagnosis not present

## 2021-05-01 DIAGNOSIS — Z87891 Personal history of nicotine dependence: Secondary | ICD-10-CM

## 2021-05-01 DIAGNOSIS — Z79899 Other long term (current) drug therapy: Secondary | ICD-10-CM

## 2021-05-01 DIAGNOSIS — R351 Nocturia: Secondary | ICD-10-CM

## 2021-05-01 DIAGNOSIS — Z20822 Contact with and (suspected) exposure to covid-19: Secondary | ICD-10-CM | POA: Diagnosis present

## 2021-05-01 DIAGNOSIS — N1832 Chronic kidney disease, stage 3b: Secondary | ICD-10-CM | POA: Diagnosis present

## 2021-05-01 DIAGNOSIS — I63512 Cerebral infarction due to unspecified occlusion or stenosis of left middle cerebral artery: Secondary | ICD-10-CM | POA: Diagnosis not present

## 2021-05-01 DIAGNOSIS — I6609 Occlusion and stenosis of unspecified middle cerebral artery: Secondary | ICD-10-CM | POA: Diagnosis present

## 2021-05-01 DIAGNOSIS — N401 Enlarged prostate with lower urinary tract symptoms: Secondary | ICD-10-CM | POA: Diagnosis present

## 2021-05-01 DIAGNOSIS — Z885 Allergy status to narcotic agent status: Secondary | ICD-10-CM | POA: Diagnosis not present

## 2021-05-01 DIAGNOSIS — R29700 NIHSS score 0: Secondary | ICD-10-CM | POA: Diagnosis present

## 2021-05-01 DIAGNOSIS — R29818 Other symptoms and signs involving the nervous system: Secondary | ICD-10-CM | POA: Diagnosis not present

## 2021-05-01 DIAGNOSIS — R471 Dysarthria and anarthria: Secondary | ICD-10-CM | POA: Diagnosis present

## 2021-05-01 DIAGNOSIS — R4701 Aphasia: Secondary | ICD-10-CM | POA: Diagnosis present

## 2021-05-01 DIAGNOSIS — Z9889 Other specified postprocedural states: Secondary | ICD-10-CM | POA: Diagnosis not present

## 2021-05-01 DIAGNOSIS — I639 Cerebral infarction, unspecified: Secondary | ICD-10-CM

## 2021-05-01 DIAGNOSIS — I959 Hypotension, unspecified: Secondary | ICD-10-CM | POA: Diagnosis present

## 2021-05-01 DIAGNOSIS — Z8249 Family history of ischemic heart disease and other diseases of the circulatory system: Secondary | ICD-10-CM

## 2021-05-01 DIAGNOSIS — J449 Chronic obstructive pulmonary disease, unspecified: Secondary | ICD-10-CM | POA: Diagnosis present

## 2021-05-01 DIAGNOSIS — Z825 Family history of asthma and other chronic lower respiratory diseases: Secondary | ICD-10-CM

## 2021-05-01 DIAGNOSIS — G2581 Restless legs syndrome: Secondary | ICD-10-CM | POA: Diagnosis present

## 2021-05-01 DIAGNOSIS — I63032 Cerebral infarction due to thrombosis of left carotid artery: Secondary | ICD-10-CM | POA: Diagnosis present

## 2021-05-01 DIAGNOSIS — N400 Enlarged prostate without lower urinary tract symptoms: Secondary | ICD-10-CM | POA: Diagnosis present

## 2021-05-01 DIAGNOSIS — Z7902 Long term (current) use of antithrombotics/antiplatelets: Secondary | ICD-10-CM

## 2021-05-01 DIAGNOSIS — G459 Transient cerebral ischemic attack, unspecified: Secondary | ICD-10-CM | POA: Diagnosis not present

## 2021-05-01 DIAGNOSIS — I129 Hypertensive chronic kidney disease with stage 1 through stage 4 chronic kidney disease, or unspecified chronic kidney disease: Secondary | ICD-10-CM | POA: Diagnosis present

## 2021-05-01 DIAGNOSIS — E871 Hypo-osmolality and hyponatremia: Secondary | ICD-10-CM | POA: Diagnosis present

## 2021-05-01 DIAGNOSIS — Z7951 Long term (current) use of inhaled steroids: Secondary | ICD-10-CM

## 2021-05-01 DIAGNOSIS — Z7982 Long term (current) use of aspirin: Secondary | ICD-10-CM

## 2021-05-01 DIAGNOSIS — E785 Hyperlipidemia, unspecified: Secondary | ICD-10-CM | POA: Diagnosis present

## 2021-05-01 DIAGNOSIS — I6300 Cerebral infarction due to thrombosis of unspecified precerebral artery: Secondary | ICD-10-CM | POA: Diagnosis not present

## 2021-05-01 HISTORY — DX: Cerebral infarction, unspecified: I63.9

## 2021-05-01 LAB — DIFFERENTIAL
Abs Immature Granulocytes: 0.04 10*3/uL (ref 0.00–0.07)
Basophils Absolute: 0 10*3/uL (ref 0.0–0.1)
Basophils Relative: 0 %
Eosinophils Absolute: 0.2 10*3/uL (ref 0.0–0.5)
Eosinophils Relative: 2 %
Immature Granulocytes: 1 %
Lymphocytes Relative: 17 %
Lymphs Abs: 1.4 10*3/uL (ref 0.7–4.0)
Monocytes Absolute: 0.8 10*3/uL (ref 0.1–1.0)
Monocytes Relative: 9 %
Neutro Abs: 6 10*3/uL (ref 1.7–7.7)
Neutrophils Relative %: 71 %

## 2021-05-01 LAB — I-STAT CHEM 8, ED
BUN: 24 mg/dL — ABNORMAL HIGH (ref 8–23)
Calcium, Ion: 1.1 mmol/L — ABNORMAL LOW (ref 1.15–1.40)
Chloride: 100 mmol/L (ref 98–111)
Creatinine, Ser: 1.8 mg/dL — ABNORMAL HIGH (ref 0.61–1.24)
Glucose, Bld: 106 mg/dL — ABNORMAL HIGH (ref 70–99)
HCT: 43 % (ref 39.0–52.0)
Hemoglobin: 14.6 g/dL (ref 13.0–17.0)
Potassium: 4.4 mmol/L (ref 3.5–5.1)
Sodium: 133 mmol/L — ABNORMAL LOW (ref 135–145)
TCO2: 24 mmol/L (ref 22–32)

## 2021-05-01 LAB — COMPREHENSIVE METABOLIC PANEL
ALT: 14 U/L (ref 0–44)
AST: 25 U/L (ref 15–41)
Albumin: 3.3 g/dL — ABNORMAL LOW (ref 3.5–5.0)
Alkaline Phosphatase: 80 U/L (ref 38–126)
Anion gap: 9 (ref 5–15)
BUN: 21 mg/dL (ref 8–23)
CO2: 21 mmol/L — ABNORMAL LOW (ref 22–32)
Calcium: 8.9 mg/dL (ref 8.9–10.3)
Chloride: 101 mmol/L (ref 98–111)
Creatinine, Ser: 1.71 mg/dL — ABNORMAL HIGH (ref 0.61–1.24)
GFR, Estimated: 37 mL/min — ABNORMAL LOW (ref >60)
Glucose, Bld: 109 mg/dL — ABNORMAL HIGH (ref 70–99)
Potassium: 4.4 mmol/L (ref 3.5–5.1)
Sodium: 131 mmol/L — ABNORMAL LOW (ref 135–145)
Total Bilirubin: 0.8 mg/dL (ref 0.3–1.2)
Total Protein: 6.1 g/dL — ABNORMAL LOW (ref 6.5–8.1)

## 2021-05-01 LAB — PROTIME-INR
INR: 1.1 (ref 0.8–1.2)
Prothrombin Time: 14.2 seconds (ref 11.4–15.2)

## 2021-05-01 LAB — CBC
HCT: 43.6 % (ref 39.0–52.0)
Hemoglobin: 14.5 g/dL (ref 13.0–17.0)
MCH: 31.1 pg (ref 26.0–34.0)
MCHC: 33.3 g/dL (ref 30.0–36.0)
MCV: 93.6 fL (ref 80.0–100.0)
Platelets: 255 10*3/uL (ref 150–400)
RBC: 4.66 MIL/uL (ref 4.22–5.81)
RDW: 12.8 % (ref 11.5–15.5)
WBC: 8.5 10*3/uL (ref 4.0–10.5)
nRBC: 0 % (ref 0.0–0.2)

## 2021-05-01 LAB — APTT: aPTT: 32 seconds (ref 24–36)

## 2021-05-01 LAB — ETHANOL: Alcohol, Ethyl (B): <10 mg/dL (ref <10)

## 2021-05-01 MED ORDER — FINASTERIDE 5 MG PO TABS
5.0000 mg | ORAL_TABLET | Freq: Every day | ORAL | Status: DC
  Administered 2021-05-02 – 2021-05-06 (×5): 5 mg via ORAL
  Filled 2021-05-01 (×6): qty 1

## 2021-05-01 MED ORDER — ACETAMINOPHEN 160 MG/5ML PO SOLN: 650.0000 mg | ORAL | Status: DC | PRN

## 2021-05-01 MED ORDER — ACETAMINOPHEN 325 MG PO TABS: 650.0000 mg | ORAL_TABLET | ORAL | Status: DC | PRN

## 2021-05-01 MED ORDER — ATORVASTATIN CALCIUM 40 MG PO TABS
40.0000 mg | ORAL_TABLET | Freq: Every day | ORAL | Status: DC
  Administered 2021-05-02 – 2021-05-06 (×4): 40 mg via ORAL
  Filled 2021-05-01 (×4): qty 1

## 2021-05-01 MED ORDER — ALBUTEROL SULFATE HFA 108 (90 BASE) MCG/ACT IN AERS
1.0000 | INHALATION_SPRAY | Freq: Four times a day (QID) | RESPIRATORY_TRACT | Status: DC | PRN
  Filled 2021-05-01: qty 6.7

## 2021-05-01 MED ORDER — SENNOSIDES-DOCUSATE SODIUM 8.6-50 MG PO TABS: 1.0000 | ORAL_TABLET | Freq: Every evening | ORAL | Status: DC | PRN

## 2021-05-01 MED ORDER — FLUTICASONE FUROATE-VILANTEROL 200-25 MCG/ACT IN AEPB
1.0000 | INHALATION_SPRAY | Freq: Every day | RESPIRATORY_TRACT | Status: DC
  Administered 2021-05-02 – 2021-05-06 (×5): 1 via RESPIRATORY_TRACT
  Filled 2021-05-01: qty 28

## 2021-05-01 MED ORDER — STROKE: EARLY STAGES OF RECOVERY BOOK: Freq: Once | Status: DC

## 2021-05-01 MED ORDER — SODIUM CHLORIDE 0.9 % IV BOLUS
1000.0000 mL | Freq: Once | INTRAVENOUS | Status: AC
  Administered 2021-05-01: 1000 mL via INTRAVENOUS

## 2021-05-01 MED ORDER — CLOPIDOGREL BISULFATE 75 MG PO TABS
75.0000 mg | ORAL_TABLET | Freq: Every day | ORAL | Status: DC
  Administered 2021-05-02 – 2021-05-04 (×3): 75 mg via ORAL
  Filled 2021-05-01 (×3): qty 1

## 2021-05-01 MED ORDER — ACETAMINOPHEN 650 MG RE SUPP: 650.0000 mg | RECTAL | Status: DC | PRN

## 2021-05-01 MED ORDER — LATANOPROST 0.005 % OP SOLN
1.0000 [drp] | Freq: Every day | OPHTHALMIC | Status: DC
Start: 2021-05-01 — End: 2021-05-06
  Administered 2021-05-02 – 2021-05-05 (×4): 1 [drp] via OPHTHALMIC
  Filled 2021-05-01 (×2): qty 2.5

## 2021-05-01 MED ORDER — PANTOPRAZOLE SODIUM 40 MG PO TBEC
40.0000 mg | DELAYED_RELEASE_TABLET | Freq: Every day | ORAL | Status: DC
  Administered 2021-05-02 – 2021-05-06 (×4): 40 mg via ORAL
  Filled 2021-05-01 (×4): qty 1

## 2021-05-01 MED ORDER — SODIUM CHLORIDE 0.9 % IV SOLN: INTRAVENOUS | Status: DC

## 2021-05-01 MED ORDER — ASPIRIN EC 325 MG PO TBEC
325.0000 mg | DELAYED_RELEASE_TABLET | Freq: Every day | ORAL | Status: DC
  Administered 2021-05-02 – 2021-05-04 (×3): 325 mg via ORAL
  Filled 2021-05-01 (×3): qty 1

## 2021-05-01 NOTE — Progress Notes (Signed)
   05/01/21 1240  AVS Discharge Documentation  AVS Discharge Instructions Including Medications Provided to patient/caregiver;Placed in discharge packet for receiving facility  Name of Person Receiving AVS Discharge Instructions Including Medications Charles Golden and family  Name of Clinician That Reviewed AVS Discharge Instructions Including Medications Venida Jarvis, RN

## 2021-05-01 NOTE — Progress Notes (Signed)
STROKE TEAM PROGRESS NOTE   INTERVAL HISTORY Multiple family members are at the bedside. Patient is being discharged home today with plans to come back next week for elective revascularization.  Patient was counseled to be compliant with dual antiplatelet therapy and to maintain aggressive hydration and to avoid hypotension.  Neurological exam is unchanged.  Vital signs are stable.  Vitals:   05/01/21 0530 05/01/21 0737 05/01/21 0817 05/01/21 1132  BP: 122/64 115/72  99/85  Pulse: 65 67  68  Resp: 11 16  16   Temp: (!) 97.5 F (36.4 C) 97.9 F (36.6 C)  97.8 F (36.6 C)  TempSrc: Oral Oral  Oral  SpO2: 94% 96% 96% 97%  Weight:      Height:       CBC:  Recent Labs  Lab 04/27/21 1826 04/27/21 1846 04/29/21 0902  WBC 9.6  --  7.7  NEUTROABS 6.1  --  5.3  HGB 14.4 15.0 14.4  HCT 44.1 44.0 44.4  MCV 95.9  --  96.7  PLT 280  --  034   Basic Metabolic Panel:  Recent Labs  Lab 04/27/21 1826 04/27/21 1846 04/29/21 0902  NA 138 139 138  K 4.6 4.6 4.5  CL 107 105 106  CO2 24  --  24  GLUCOSE 94 92 103*  BUN 23 25* 26*  CREATININE 1.66* 1.70* 1.58*  CALCIUM 8.8*  --  8.7*   Lipid Panel:  Recent Labs  Lab 04/30/21 0126  CHOL 105  TRIG 64  HDL 42  CHOLHDL 2.5  VLDL 13  LDLCALC 50   HgbA1c:  Recent Labs  Lab 04/30/21 0126  HGBA1C 5.8*   Urine Drug Screen:  Recent Labs  Lab 04/29/21 1210  LABOPIA NONE DETECTED  COCAINSCRNUR NONE DETECTED  LABBENZ NONE DETECTED  AMPHETMU NONE DETECTED  THCU NONE DETECTED  LABBARB NONE DETECTED    Alcohol Level  Recent Labs  Lab 04/29/21 0902  ETH <10    IMAGING past 24 hours No results found.  PHYSICAL EXAM General - Well nourished, well developed, in no apparent distress.   Ophthalmologic - fundi not visualized due to noncooperation.   Cardiovascular - Regular rhythm and rate.   Mental Status -  Level of arousal and orientation to time, place, and person were intact. Language including expression, naming,  repetition, comprehension was assessed and found intact. Attention span and concentration were normal. Recent and remote memory were intact. Fund of Knowledge was assessed and was intact.   Cranial Nerves II - XII - II - Visual field intact OU. III, IV, VI - Extraocular movements intact. V - Facial sensation intact bilaterally. VII - Facial movement intact bilaterally. VIII - Hearing & vestibular intact bilaterally. X - Palate elevates symmetrically. XI - Chin turning & shoulder shrug intact bilaterally. XII - Tongue protrusion intact.   Motor Strength - [name] strength was normal in all extremities and pronator drift was absent.  Bulk was normal and fasciculations were absent.   Motor Tone - Muscle tone was assessed at the neck and appendages and was normal.   Reflexes - [name] reflexes were symmetrical in all extremities and he had no pathological reflexes.   Sensory - Light touch, temperature/pinprick were assessed and were symmetrical.     Coordination - The patient had normal movements in the hands and feet with no ataxia or dysmetria.  Tremor was absent.   Gait and Station - deferred. PT/OT to see patient    ASSESSMENT/PLAN Mr.  Charles Golden is a 87 y.o. male with history of  history of BPH, COP, CKD stage IIIb, and recent stroke on 04/27/2021 presenting with another episode of aphasia and unsteady gait. He awoke around 7am and his daughter noticed that he was having difficulty walking and speaking. He was brought to [facility] and had a teleconsult and then was transferred to [facility] for further imaging and workup.  Stroke:  evolution/enlargement of previous left insular cortex infarct due to acute occlusion of the inferior division of the left M2 and likely chronic occlusion of left ICA due to thrombosis source Code Stroke CT head Small area of cytotoxic edema in the left insula  corresponding to abnormal diffusion yesterday. Nearby hyperdense left MCA M2 branch in  the sylvian fissure persist. ASPECTS 9 (abnormal left insula).  Expected CT appearance of the small left insular infarct on MRI yesterday. Elsewhere stable gray-white matter differentiation since the CT on 04/27/2021.  CTA head & neck Interval partial recanalization of the cervical left ICA. Remains focal near occlusion with submillimeter lumen and decreased distal vessel caliber reflecting flow limitation. Intracranially, the left ICA is now patent. There is a persistent left M2 MCA occlusion, but the occlusion now occurs more distally within the sylvian fissure. Some distal reconstitution is present.  Perfusion imaging demonstrates no core infarction and 83 mL of penumbra in the left MCA territory. MRI  Two areas of acute infarction within the left frontal lobe, the larger of which is in the insula. Magnetic susceptibility effect within the left MCA likely indicates a site of thrombus. 2D Echo Normal left ventricular function, EF 60-65% LDL 50 HgbA1c 5.8 VTE prophylaxis - lovenox 40mg     Diet   Diet Heart Room service appropriate? Yes; Fluid consistency: Thin   aspirin 325 mg daily and clopidogrel 75 mg daily prior to admission, now on aspirin 325 mg daily and clopidogrel 75 mg daily. Continue prior medications Therapy recommendations: No follow-up necessary disposition:    home    Hypertension Stable Avoid low BP, long term goal 130-150 given ICA occlusion  Hyperlipidemia Home meds:  Atorvastatin 40mg , resumed in hospital LDL 50, goal < 70 Continue statin at discharge  Diabetes type II Controlled HgbA1c 5.8, goal < 7.0 CBGs No results for input(s): GLUCAP in the last 72 hours.   SSI  Other Stroke Risk Factors Advanced Age >/= 19  Hx stroke/TIA Migraines- patient does report using Excedrin migraine for headaches   Other Active Problems COPD Continue home bronchodilator BPH Resume home medication- flomax CKD IIIb- at baseline function Monitor renal function  Hospital day  # 2  Patient presented with transient worsening of symptoms and follow-up CT angiogram shows some recanalization of the left internal carotid artery with mixed plaque with superimposed thrombus with focal near occlusion with submillimeter residual lumen with small caliber vessel.  Plan is to continue dual antiplatelet therapy patient will return next week for elective revascularization and stenting by Dr. Norma Fredrickson.  Maintain aggressive hydration and avoid hypotension.  Discussed with patient, family and Dr. Norma Fredrickson.  Greater than 50% time during this 25-minute visit was spent in counseling and coordination of care/patient care team notes and questions.  Follow-up as an outpatient stroke clinic in 6 weeks.   Antony Contras MD  To contact Stroke Continuity provider, please refer to [email or web address] After hours, contact General Neurology

## 2021-05-01 NOTE — Evaluation (Signed)
Speech Language Pathology Evaluation Patient Details Name: Charles Golden MRN: [id number] DOB: January 23, 1926 Today's Date: 05/01/2021 Time: 0354-6568 SLP Time Calculation (min) (ACUTE ONLY): 20 min  Problem List:  Patient Active Problem List   Diagnosis Date Noted   Acute ischemic stroke ([place]) 04/29/2021   TIA (transient ischemic attack) 04/27/2021   Chronic tension-type headache, not intractable 12/13/2020   Bilateral sensorineural hearing loss 09/25/2019   Bronchiectasis without complication ([place]) 12/75/1700   Fatigue 05/27/2019   Vitamin D deficiency 01/21/2019   Weakness 01/21/2019   Recurrent right inguinal hernia    Presbycusis of both ears 07/19/2017   Restless leg syndrome 11/25/2016   Collagenous colitis 07/24/2016   Deviated nasal septum 09/03/2015   Basal cell carcinoma of nose 09/03/2015   Thoracic aortic atherosclerosis ([place]) 05/03/2015   Upper airway cough syndrome 04/28/2015   GERD without esophagitis 04/11/2015   Acute medial meniscal tear 07/20/2014   COPD GOLD I  12/08/2013   Small bowel volvulus ([place]) 08/05/2013   Chronic headache 08/05/2013   Left inguinal hernia 08/05/2013   Incisional hernia, without obstruction or gangrene 08/05/2013   Headache 05/01/2013   Obstructive chronic bronchitis without exacerbation ([place]) 04/30/2013   Left hip pain 04/29/2013   BPH (benign prostatic hyperplasia) 01/05/2013   IBS (irritable bowel syndrome) 09/03/2012   Chronic rhinosinusitis 09/03/2012   Lung nodules 05/04/2003   Past Medical History:  Past Medical History:  Diagnosis Date   Bronchitis, chronic (HCC)    COPD (chronic obstructive pulmonary disease) ([place])    Enlarged prostate    GERD (gastroesophageal reflux disease)    Glaucoma    Glucagonoma    Hernia, incisional    at present   [facility] (hard of hearing)    IBS (irritable bowel syndrome)    Sciatic pain    Sinus congestion    Past Surgical History:  Past Surgical History:  Procedure Laterality Date    BOWEL RESECTION N/A 09/04/2012   Procedure: SMALL BOWEL RESECTION;  Surgeon: Donato Heinz, MD;  Location: [facility];  Service: General;  Laterality: N/A;  Anastimosis   HEMORRHOID SURGERY     HERNIA REPAIR Bilateral 70's   INGUINAL HERNIA REPAIR Left 09/14/2013   Procedure: LEFT INGUINAL HERNIORRHAPHY;  Surgeon: Jamesetta So, MD;  Location: [facility];  Service: General;  Laterality: Left;   INGUINAL HERNIA REPAIR Right 12/23/2018   Procedure: RECURRENT RIGHT INGUINAL HERNIA  REPAIR  WITH MESH;  Surgeon: Aviva Signs, MD;  Location: [facility];  Service: General;  Laterality: Right;   INSERTION OF MESH Left 09/14/2013   Procedure: INSERTION OF MESH;  Surgeon: Jamesetta So, MD;  Location: [facility];  Service: General;  Laterality: Left;   KNEE ARTHROSCOPY Left 07/21/2014   Procedure: LEFT KNEE ARTHROSCOPY WITH MEDIAL MENISCAL DEBRIDEMENT ;  Surgeon: Gearlean Alf, MD;  Location: [facility];  Service: Orthopedics;  Laterality: Left;   LAPAROTOMY N/A 09/04/2012   Procedure: EXPLORATORY LAPAROTOMY;  Surgeon: Donato Heinz, MD;  Location: [facility];  Service: General;  Laterality: N/A;   SKIN LESION EXCISION     [name]   TONSILLECTOMY     HPI:  Pt is a 87 y.o. M who presents 04/29/2021 with aphasia and difficulty walking. MRI showing 2 areas of acute infarction within the left frontal lobe, the larger of which is in the insula. Neurology attributed s/s due to evolution of his previous left insular cortex infarct due to acute occlusion of the inferior division of the left M2 and  likely chronic occlusion of left ICA due to thrombosis source. Significant PMH: stroke, BPH, COPD, CKD 3.   Assessment / Plan / Recommendation Clinical Impression  Pt presents with resolved aphasia. Speech is clear and fluent. Receptive language WNL with + following multistep commands and comprehension of complex information. Expressive language is normal with no anomia, good grammatical form. Reading WNL No SLP f/u is needed. D/W pt  and dtrs at bedside.    SLP Assessment  SLP Recommendation/Assessment: Patient does not need any further Speech [place] [facility] SLP Visit Diagnosis: Aphasia (R47.01)    Recommendations for follow up therapy are one component of a multi-disciplinary discharge planning process, led by the attending physician.  Recommendations may be updated based on patient status, additional functional criteria and insurance authorization.    Follow Up Recommendations  No SLP follow up    Assistance Recommended at Discharge     Functional Status Assessment    Frequency and Duration           SLP Evaluation Cognition  Overall Cognitive Status: Within Functional Limits for tasks assessed Arousal/Alertness: Awake/alert Orientation Level: Oriented X4       Comprehension  Auditory Comprehension Overall Auditory Comprehension: Appears within functional limits for tasks assessed Yes/No Questions: Within Functional Limits Commands: Within Functional Limits Visual Recognition/Discrimination Discrimination: Within Function Limits Reading Comprehension Reading Status: Within funtional limits    Expression Expression Primary Mode of Expression: Verbal Verbal Expression Overall Verbal Expression: Appears within functional limits for tasks assessed Initiation: No impairment Level of Generative/Spontaneous Verbalization: Conversation Repetition: No impairment Naming: No impairment Pragmatics: No impairment   Oral / Motor  Oral Motor/Sensory Function Overall Oral Motor/Sensory Function: Within functional limits Motor Speech Overall Motor Speech: Appears within functional limits for tasks assessed   GO                   Charles Golden L. [name], [place] Office number [phone] Pager [phone]  Charles Golden 05/01/2021, 10:44 AM

## 2021-05-01 NOTE — Discharge Instructions (Signed)
Follow with Primary MD [name], DO in 7 days   Get CBC, CMP,  checked  by Primary MD next visit.    Activity: As tolerated with Full fall precautions use walker/cane & assistance as needed   Disposition Home    Diet: Heart Healthy , with feeding assistance and aspiration precautions.   On your next visit with your primary care physician please Get Medicines reviewed and adjusted.   Please request your Prim.MD to go over all Hospital Tests and Procedure/Radiological results at the follow up, please get all Hospital records sent to your Prim MD by signing hospital release before you go home.   If you experience worsening of your admission symptoms, develop shortness of breath, life threatening emergency, suicidal or homicidal thoughts you must seek medical attention immediately by calling 911 or calling your MD immediately  if symptoms less severe.  You Must read complete instructions/literature along with all the possible adverse reactions/side effects for all the Medicines you take and that have been prescribed to you. Take any new Medicines after you have completely understood and accpet all the possible adverse reactions/side effects.   Do not drive, operating heavy machinery, perform activities at heights, swimming or participation in water activities or provide baby sitting services if your were admitted for syncope or siezures until you have seen by Primary MD or a Neurologist and advised to do so again.  Do not drive when taking Pain medications.    Do not take more than prescribed Pain, Sleep and Anxiety Medications  Special Instructions: If you have smoked or chewed Tobacco  in the last 2 yrs please stop smoking, stop any regular Alcohol  and or any Recreational drug use.  Wear Seat belts while driving.   Please note  You were cared for by a hospitalist during your hospital stay. If you have any questions about your discharge medications or the care you  received while you were in the hospital after you are discharged, you can call the unit and asked to speak with the hospitalist on call if the hospitalist that took care of you is not available. Once you are discharged, your primary care physician will handle any further medical issues. Please note that NO REFILLS for any discharge medications will be authorized once you are discharged, as it is imperative that you return to your primary care physician (or establish a relationship with a primary care physician if you do not have one) for your aftercare needs so that they can reassess your need for medications and monitor your lab values.

## 2021-05-01 NOTE — Discharge Summary (Signed)
Physician Discharge Summary  Charles Golden:[id number] DOB: May 15, 1926 DOA: 04/29/2021  PCP: [name], DO  Admit date: 04/29/2021 Discharge date: 05/01/2021  Admitted From: Home Disposition:  Home   Recommendations for Outpatient Follow-up:  Follow up with PCP in 1-2 weeks Please obtain BMP/CBC in one week Plan  is to continue dual antiplatelet therapy aspirin 325 mg oral daily, and Plavix 75 mg oral daily, patient will return next week for elective revascularization and stenting by Dr. Norma Fredrickson.  Maintain aggressive hydration and avoid hypotension.    Home Health:NO   Discharge Condition:Stable CODE STATUS:FULL Diet recommendation: Heart Healthy  Brief/Interim Summary:  Charles Golden is a 87 y.o. male with medical history of acute ischemic stroke, BPH, COPD, CKD 3, GERD presenting with word finding difficulties.  Notably, the patient was recently admitted to the hospital from 04/27/2021 to 04/28/21 secondary to ischemic stroke.  MRI at that time showed an ischemic focus in the left insular region.  CTA of the head and neck on 04/27/2021 showed an occlusion of the left proximal ICA.  Neurology followed the patient and did not recommend any further intervention regarding the patient's left internal carotid.  The patient was sent home with aspirin and Plavix for 3 months, then aspirin monotherapy thereafter -Patient presents to [facility] due to complaints of word finding difficulty and stumbling, he was transferred to [facility] for further work-up for acute CVA.    Acute CVA -Neurology input greatly appreciated, MRI of the brain showing 2 areas of acute infarction within the left frontal lobe, the larger of which is in the insula. -And had recent work-up including 2D echo with normal EF 60 to 65%, LDL was at 50, hemoglobin A1c was at 5.8 -Neurology input appreciated, it was felt due to evolution of his previous left insular cortex infarct due to acute  occlusion of the inferior division of the left M2 and likely chronic occlusion of left ICA due to thrombosis source. -per neurology " follow-up CT angiogram shows some recanalization of the left internal carotid artery with mixed plaque with superimposed thrombus with focal near occlusion with submillimeter residual lumen with small caliber vessel.  Plan is to continue dual antiplatelet therapy patient will return next week for elective revascularization and stenting by Dr. Norma Fredrickson.  Maintain aggressive hydration and avoid hypotension"  left ICA stenosis Neuro interventional radiology input greatly appreciated,  "plan for patient to return week of 12/12 for left ICA angioplasty/stent placement with anesthesia - patient to continue Plavix 75 mg PO QD + ASA 325 mg PO QD upon discharge. "   BPH -Holding tamsulosin per neurology recommendation   CKD stage IIIb -Baseline creatinine 1.5-1.7   Impaired glucose tolerance -Hemoglobin A1c 6.0 as discussed above   COPD/bronchiectasis -Stable on room air   GERD -Continue pantoprazole        Discharge Diagnoses:  Principal Problem:   Acute ischemic stroke [facility])    Discharge Instructions  Discharge Instructions     Diet - low sodium heart healthy   Complete by: As directed    Increase activity slowly   Complete by: As directed       Allergies as of 05/01/2021       Reactions   Demerol [meperidine] Other (See Comments)   Pt. States "he woke up during a colonoscopy"   Spiriva Handihaler [tiotropium Bromide Monohydrate] Other (See Comments)   Mild Urinary Retention        Medication List  TAKE these medications    aspirin 325 MG EC tablet Take 1 tablet (325 mg total) by mouth daily.   atorvastatin 40 MG tablet Commonly known as: LIPITOR Take 1 tablet (40 mg total) by mouth daily.   azelastine 0.1 % nasal spray Commonly known as: ASTELIN PLACE 1 SPRAY IN EACH NOSTRIL ONCE A DAY AS DIRECTED What changed:  how  much to take how to take this when to take this additional instructions   Breo Ellipta 200-25 MCG/ACT Aepb Generic drug: fluticasone furoate-vilanterol Inhale 1 puff into the lungs daily.   clopidogrel 75 MG tablet Commonly known as: PLAVIX Take 1 tablet (75 mg total) by mouth daily.   finasteride 5 MG tablet Commonly known as: PROSCAR Take 5 mg by mouth daily.   fluticasone 50 MCG/ACT nasal spray Commonly known as: FLONASE Place 2 sprays into both nostrils daily.   guaiFENesin 600 MG 12 hr tablet Commonly known as: MUCINEX Take 600 mg by mouth daily as needed for to loosen phlegm.   latanoprost 0.005 % ophthalmic solution Commonly known as: XALATAN Place 1 drop into the left eye at bedtime. What changed: how to take this   pantoprazole 40 MG tablet Commonly known as: Protonix Take 1 tablet (40 mg total) by mouth daily.   tamsulosin 0.4 MG Caps capsule Commonly known as: FLOMAX Take 0.8 mg by mouth daily.   timolol 0.5 % ophthalmic solution Commonly known as: TIMOPTIC Place 1 drop into the left eye every morning.   Ventolin HFA 108 (90 Base) MCG/ACT inhaler Generic drug: albuterol INHALE 2 PUFFS EVERY 6 HOURS AS NEEDED FOR WHEEZING OR SHORTNESS OF BREATH What changed: See the new instructions.   Vitamin B-12 5000 MCG Subl Place 1 tablet under the tongue daily.   Vitamin D3 50 MCG (2000 UT) Tabs Take 1 tablet by mouth daily.        Follow-up Information     de Rosario Jacks, MD Follow up.   Specialties: Radiology, Interventional Radiology Why: IR scheduler will call you with appointment date/time (planned for week of 12/12) -- please call [phone] or [phone] with any questions or concerns prior to your appointment. Contact information: [address] [place] [address] [phone]                Allergies  Allergen Reactions   Demerol [Meperidine] Other (See Comments)    Pt. States "he woke up during a  colonoscopy"   Spiriva Handihaler [Tiotropium Bromide Monohydrate] Other (See Comments)    Mild Urinary Retention    Consultations: Neurology Neuro interventional radiology   Procedures/Studies: MR BRAIN WO CONTRAST  Result Date: 04/29/2021 CLINICAL DATA:  Acute neurologic deficit EXAM: MRI HEAD WITHOUT CONTRAST TECHNIQUE: Multiplanar, multiecho pulse sequences of the brain and surrounding structures were obtained without intravenous contrast. COMPARISON:  None. FINDINGS: Brain: There are 2 areas of acute infarction within the left frontal lobe, the larger of which is in the insula. No acute hemorrhage. Normal white matter signal. Generalized volume loss without a clear lobar predilection. Focus of chronic microhemorrhage in the right cerebellum the midline structures are normal. Vascular: Magnetic susceptibility effect within the left MCA likely indicates a site of thrombus. Skull and upper cervical spine: Normal calvarium and skull base. Visualized upper cervical spine and soft tissues are normal. Sinuses/Orbits:No paranasal sinus fluid levels or advanced mucosal thickening. No mastoid or middle ear effusion. Normal orbits. IMPRESSION: 1. Two areas of acute infarction within the left frontal lobe, the  larger of which is in the insula. No hemorrhage or mass effect. 2. Magnetic susceptibility effect within the left MCA likely indicates a site of thrombus. Electronically Signed   By: Ulyses Jarred M.D.   On: 04/29/2021 19:16   MR BRAIN WO CONTRAST  Result Date: 04/28/2021 CLINICAL DATA:  Acute neurologic deficit EXAM: MRI HEAD WITHOUT CONTRAST TECHNIQUE: Multiplanar, multiecho pulse sequences of the brain and surrounding structures were obtained without intravenous contrast. COMPARISON:  None. FINDINGS: Brain: Small focus of abnormal diffusion restriction in the left insula. No other diffusion abnormality. Focus of chronic microhemorrhage in the right cerebellum . Normal white matter signal.  Generalized volume loss without a clear lobar predilection. The midline structures are normal. Vascular: Occluded left ICA.  The other flow voids are normal. Skull and upper cervical spine: Normal calvarium and skull base. Visualized upper cervical spine and soft tissues are normal. Sinuses/Orbits:No paranasal sinus fluid levels or advanced mucosal thickening. No mastoid or middle ear effusion. Normal orbits. IMPRESSION: 1. Small focus of acute ischemia in the left insula. No hemorrhage or mass effect. 2. Occluded left ICA. Electronically Signed   By: Ulyses Jarred M.D.   On: 04/28/2021 01:50   ECHOCARDIOGRAM COMPLETE  Result Date: 04/28/2021    ECHOCARDIOGRAM REPORT   Patient Name:   Charles Golden Date of Exam: 04/28/2021 Medical Rec #:  [id number]     Height:       70.0 in Accession #:    [id number]    Weight:       170.0 lb Date of Birth:  1926-02-13     BSA:          1.948 m Patient Age:    51 years      BP:           158/79 mmHg Patient Gender: M             HR:           63 bpm. Exam Location:  Inpatient Procedure: 2D Echo, Color Doppler and Cardiac Doppler Indications:    Stroke i63.9  History:        Patient has no prior history of Echocardiogram examinations.                 COPD.  Sonographer:    Raquel Sarna Senior RDCS Referring Phys: [id number] Candace Gallus MELVIN  Sonographer Comments: Technically difficult due to poor echo windows, repositioned at end of study for wall motion. IMPRESSIONS  1. Technically difficult study with limited views. Left ventricular ejection fraction, by estimation, is 60 to 65%. The left ventricle has normal function. The left ventricle has no regional wall motion abnormalities. Left ventricular diastolic parameters are consistent with Grade II diastolic dysfunction (pseudonormalization). Elevated left atrial pressure.  2. Right ventricle is poorly visualized but appears grossly normal systolic function and mild to moderately dilated  3. Left atrial size was mildly dilated.  4. The  mitral valve is normal in structure. No evidence of mitral valve regurgitation.  5. The aortic valve was not well visualized. Aortic valve regurgitation is not visualized. No aortic stenosis is present. FINDINGS  Left Ventricle: Left ventricular ejection fraction, by estimation, is 60 to 65%. The left ventricle has normal function. The left ventricle has no regional wall motion abnormalities. The left ventricular internal cavity size was normal in size. There is  no left ventricular hypertrophy. Left ventricular diastolic parameters are consistent with Grade II diastolic dysfunction (pseudonormalization). Elevated left atrial pressure. Right Ventricle:  The right ventricular size is moderately enlarged. Right vetricular wall thickness was not well visualized. Right ventricular systolic function is normal. Left Atrium: Left atrial size was mildly dilated. Right Atrium: Right atrial size was normal in size. Pericardium: There is no evidence of pericardial effusion. Mitral Valve: The mitral valve is normal in structure. No evidence of mitral valve regurgitation. Tricuspid Valve: The tricuspid valve is normal in structure. Tricuspid valve regurgitation is trivial. Aortic Valve: The aortic valve was not well visualized. Aortic valve regurgitation is not visualized. No aortic stenosis is present. Pulmonic Valve: The pulmonic valve was not well visualized. Pulmonic valve regurgitation is not visualized. Aorta: The aortic root is normal in size and structure. IAS/Shunts: The interatrial septum was not well visualized.  LEFT VENTRICLE PLAX 2D LVIDd:         4.20 cm   Diastology LVIDs:         2.70 cm   LV e' medial:    4.20 cm/s LV PW:         1.00 cm   LV E/e' medial:  13.5 LV IVS:        0.80 cm   LV e' lateral:   3.90 cm/s LVOT diam:     2.00 cm   LV E/e' lateral: 14.5 LV SV:         54 LV SV Index:   28 LVOT Area:     3.14 cm  RIGHT VENTRICLE RV S prime:     11.10 cm/s TAPSE (M-mode): 2.0 cm LEFT ATRIUM             Index         RIGHT ATRIUM           Index LA diam:        2.70 cm 1.39 cm/m   RA Area:     15.50 cm LA Vol (A2C):   69.9 ml 35.88 ml/m  RA Volume:   39.10 ml  20.07 ml/m LA Vol (A4C):   39.5 ml 20.28 ml/m LA Biplane Vol: 70.0 ml 35.93 ml/m  AORTIC VALVE LVOT Vmax:   66.10 cm/s LVOT Vmean:  49.600 cm/s LVOT VTI:    0.171 m  AORTA Ao Root diam: 3.30 cm MITRAL VALVE               TRICUSPID VALVE MV Area (PHT): 2.29 cm    TR Peak grad:   27.7 mmHg MV Decel Time: 331 msec    TR Vmax:        263.00 cm/s MV E velocity: 56.60 cm/s MV A velocity: 70.20 cm/s  SHUNTS MV E/A ratio:  0.81        Systemic VTI:  0.17 m                            Systemic Diam: 2.00 cm Oswaldo Milian MD Electronically signed by Oswaldo Milian MD Signature Date/Time: 04/28/2021/1:56:04 PM    Final    CT HEAD CODE STROKE WO CONTRAST`  Result Date: 04/29/2021 CLINICAL DATA:  Code stroke. 87 year old male with aphasia. Occluded left internal carotid artery and left MCA M2 branches on CTA 2 days ago. EXAM: CT HEAD WITHOUT CONTRAST TECHNIQUE: Contiguous axial images were obtained from the base of the skull through the vertex without intravenous contrast. COMPARISON:  [facility] CT head, CTA head and neck 04/27/2021. Brain MRI yesterday. FINDINGS: Brain: Small area of cytotoxic edema in the left insula corresponding  to abnormal diffusion yesterday. Nearby hyperdense left MCA M2 branch in the sylvian fissure persist. But elsewhere gray-white matter differentiation remains stable since 04/27/2021. No acute intracranial hemorrhage identified. No midline shift, mass effect, or evidence of intracranial mass lesion. No ventriculomegaly. Vascular: Calcified atherosclerosis at the skull base. Persistent hyperdense left MCA M2 branch. Skull: No acute osseous abnormality identified. Sinuses/Orbits: Visualized paranasal sinuses and mastoids are stable and well aerated. Other: No acute orbit or scalp soft tissue finding. ASPECTS [facility]  Stroke Program Early CT Score) Total score (0-10 with 10 being normal): 9 (abnormal left insula as seen by MRI yesterday). IMPRESSION: 1. Expected CT appearance of the small left insular infarct on MRI yesterday. Elsewhere stable gray-white matter differentiation since the CT on 04/27/2021. ASPECTS 9. 2. No intracranial hemorrhage or mass effect. Study discussed by telephone with Dr. Davonna Belling on 04/29/2021 at 09:19 . Electronically Signed   By: Genevie Ann M.D.   On: 04/29/2021 09:20   CT HEAD CODE STROKE WO CONTRAST  Result Date: 04/27/2021 CLINICAL DATA:  Code stroke.  Acute neuro deficit. EXAM: CT HEAD WITHOUT CONTRAST TECHNIQUE: Contiguous axial images were obtained from the base of the skull through the vertex without intravenous contrast. COMPARISON:  MRI head 05/14/2013.  CT head 03/11/2009 FINDINGS: Brain: Generalized atrophy. Negative for hydrocephalus. Negative for acute infarct, hemorrhage, mass. Vascular: Hyperdensity of the left middle cerebral artery in the sylvian fissure. Probable left M2 branch occlusion. Correlate with symptoms. Skull: Negative Sinuses/Orbits: Mild mucosal edema right maxillary sinus. Bilateral cataract extraction Other: None ASPECTS ([place] Stroke Program Early CT Score) - Ganglionic level infarction (caudate, lentiform nuclei, internal capsule, insula, M1-M3 cortex): 7 - Supraganglionic infarction (M4-M6 cortex): 3 Total score (0-10 with 10 being normal): 10 IMPRESSION: 1. Negative for acute infarct or hemorrhage 2. Hyperdense left MCA branch in the sylvian fissure likely due to acute thrombosis. Correlate with symptoms. 3. ASPECTS is 10 4. Code stroke imaging results were communicated on 04/27/2021 at 6:39 pm to provider Lindzen via text page amion Electronically Signed   By: Franchot Gallo M.D.   On: 04/27/2021 18:46   CT ANGIO HEAD NECK W WO CM (CODE STROKE)  Result Date: 04/27/2021 CLINICAL DATA:  Acute neuro deficit. Speech abnormality which has cleared. EXAM: CT  ANGIOGRAPHY HEAD AND NECK TECHNIQUE: Multidetector CT imaging of the head and neck was performed using the standard protocol during bolus administration of intravenous contrast. Multiplanar CT image reconstructions and MIPs were obtained to evaluate the vascular anatomy. Carotid stenosis measurements (when applicable) are obtained utilizing NASCET criteria, using the distal internal carotid diameter as the denominator. CONTRAST:  32mL OMNIPAQUE IOHEXOL 350 MG/ML SOLN COMPARISON:  CT head 04/27/2021 FINDINGS: CTA NECK FINDINGS Aortic arch: Atherosclerotic aortic arch and proximal great vessels. Proximal great vessels are patent. Right carotid system: Atherosclerotic calcification right carotid bifurcation without significant stenosis Left carotid system: Atherosclerotic disease at the left carotid bifurcation. Occlusion of the proximal left internal carotid artery. Left external carotid artery patent. Vertebral arteries: Both vertebral arteries are patent to the skull base with mild stenosis proximally on the right. Skeleton: Cervical spondylosis.  No acute skeletal abnormality. Other neck: Negative for mass or adenopathy Upper chest: Lung apices clear bilaterally. Review of the MIP images confirms the above findings CTA HEAD FINDINGS Anterior circulation: Right cavernous carotid widely patent. Right anterior and right middle cerebral arteries widely patent. Left internal carotid artery is occluded with reconstitution of the supraclinoid segment. Left anterior cerebral artery patent. Left M1 segment patent.  Occlusion of the proximal left M2 branch inferior division which appears acute. Posterior circulation: Both vertebral arteries patent to the basilar. PICA patent bilaterally. Basilar widely patent. Superior cerebellar and posterior cerebral arteries patent bilaterally. Venous sinuses: Normal venous enhancement Anatomic variants: None Review of the MIP images confirms the above findings IMPRESSION: 1. Occlusion of  the proximal left internal carotid artery of indeterminate age. However, this is likely acute given the acute occlusion of the inferior division of the left MCA M2 branch. This was hyperdense on CT consistent with acute thrombus. 2. No significant right carotid stenosis. No significant vertebral stenosis. 3. These results were called by telephone at the time of interpretation on 04/27/2021 at 7:24 pm to provider ERIC [facility] , who verbally acknowledged these results. Electronically Signed   By: Franchot Gallo M.D.   On: 04/27/2021 19:29   CT ANGIO HEAD NECK W WO CM W PERF (CODE STROKE)  Result Date: 04/29/2021 CLINICAL DATA:  Neuro deficit, acute, stroke suspected EXAM: CT ANGIOGRAPHY HEAD AND NECK CT PERFUSION BRAIN TECHNIQUE: Multidetector CT imaging of the head and neck was performed using the standard protocol during bolus administration of intravenous contrast. Multiplanar CT image reconstructions and MIPs were obtained to evaluate the vascular anatomy. Carotid stenosis measurements (when applicable) are obtained utilizing NASCET criteria, using the distal internal carotid diameter as the denominator. Multiphase CT imaging of the brain was performed following IV bolus contrast injection. Subsequent parametric perfusion maps were calculated using RAPID software. CONTRAST:  124mL OMNIPAQUE IOHEXOL 350 MG/ML SOLN COMPARISON:  Recent CTA and MRI brain FINDINGS: CTA NECK Aortic arch: Calcified plaque along the arch and patent great vessel origins. Right carotid system: Patent. Stable appearance. No hemodynamically significant stenosis at the ICA origin. Left carotid system: Patent common carotid. There has been some recanalization of the internal carotid artery. Persistent mixed plaque including irregular noncalcified plaque along the proximal ICA with superimposed thrombus. There is focal near occlusion with submillimeter residual lumen. Vessel caliber remains small reflecting flow limitation. Vertebral  arteries: Patent.  Stable appearance. Skeleton: Stable advanced cervical spine degenerative changes. Other neck: No new abnormality. Upper chest: Emphysema.  No new abnormality. Review of the MIP images confirms the above findings CTA HEAD Anterior circulation: As in the neck, there is no flow present within the left ICA. Left M1 MCA is patent. Persistent left M2 MCA occlusion although now slightly more distal. There is some reconstitution at the M2-M3 junction as before. Right middle and both anterior cerebral arteries are patent. Posterior circulation: Intracranial vertebral arteries are patent. Basilar artery is patent. Major cerebellar artery origins are patent. Posterior cerebral arteries are patent. Venous sinuses: Patent as allowed by contrast bolus timing. Review of the MIP images confirms the above findings CT Brain Perfusion Findings: CBF (<30%) Volume: 2mL Perfusion (Tmax>6.0s) volume: 49mL Mismatch Volume: 16mL Infarction Location: Left MCA territory IMPRESSION: Interval partial recanalization of the cervical left ICA. Remains focal near occlusion with submillimeter lumen and decreased distal vessel caliber reflecting flow limitation. Intracranially, the left ICA is now patent. There is a persistent left M2 MCA occlusion, but the occlusion now occurs more distally within the sylvian fissure. Some distal reconstitution is present. Perfusion imaging demonstrates no core infarction and 83 mL of penumbra in the left MCA territory. Electronically Signed   By: Macy Mis M.D.   On: 04/29/2021 10:01      Subjective:  Patient denies any new focal deficits, no complaints today.  Discharge Exam: Vitals:   05/01/21 0817 05/01/21  1132  BP:  99/85  Pulse:  68  Resp:  16  Temp:  97.8 F (36.6 C)  SpO2: 96% 97%   Vitals:   05/01/21 0530 05/01/21 0737 05/01/21 0817 05/01/21 1132  BP: 122/64 115/72  99/85  Pulse: 65 67  68  Resp: 11 16  16   Temp: (!) 97.5 F (36.4 C) 97.9 F (36.6 C)  97.8 F  (36.6 C)  TempSrc: Oral Oral  Oral  SpO2: 94% 96% 96% 97%  Weight:      Height:        General: Pt is alert, awake, not in acute distress Cardiovascular: RRR, S1/S2 +, no rubs, no gallops Respiratory: CTA bilaterally, no wheezing, no rhonchi Abdominal: Soft, NT, ND, bowel sounds + Extremities: no edema, no cyanosis    The results of significant diagnostics from this hospitalization (including imaging, microbiology, ancillary and laboratory) are listed below for reference.     Microbiology: Recent Results (from the past 240 hour(s))  Resp Panel by RT-PCR (Flu A&B, Covid) Nasopharyngeal Swab     Status: None   Collection Time: 04/29/21  9:02 AM   Specimen: Nasopharyngeal Swab; Nasopharyngeal(NP) swabs in vial transport medium  Result Value Ref Range Status   SARS Coronavirus 2 by RT PCR NEGATIVE NEGATIVE Final    Comment: (NOTE) SARS-CoV-2 target nucleic acids are NOT DETECTED.  The SARS-CoV-2 RNA is generally detectable in upper respiratory specimens during the acute phase of infection. The lowest concentration of SARS-CoV-2 viral copies this assay can detect is 138 copies/mL. A negative result does not preclude SARS-Cov-2 infection and should not be used as the sole basis for treatment or other patient management decisions. A negative result may occur with  improper specimen collection/handling, submission of specimen other than nasopharyngeal swab, presence of viral mutation(s) within the areas targeted by this assay, and inadequate number of viral copies(<138 copies/mL). A negative result must be combined with clinical observations, patient history, and epidemiological information. The expected result is Negative.  Fact Sheet for Patients:  EntrepreneurPulse.com.au  Fact Sheet for Healthcare Providers:  IncredibleEmployment.be  This test is no t yet approved or cleared by the [place] FDA and  has been authorized for detection  and/or diagnosis of SARS-CoV-2 by FDA under an Emergency Use Authorization (EUA). This EUA will remain  in effect (meaning this test can be used) for the duration of the COVID-19 declaration under Section 564(b)(1) of the Act, 21 U.S.C.section 360bbb-3(b)(1), unless the authorization is terminated  or revoked sooner.       Influenza A by PCR NEGATIVE NEGATIVE Final   Influenza B by PCR NEGATIVE NEGATIVE Final    Comment: (NOTE) The Xpert Xpress SARS-CoV-2/FLU/RSV plus assay is intended as an aid in the diagnosis of influenza from Nasopharyngeal swab specimens and should not be used as a sole basis for treatment. Nasal washings and aspirates are unacceptable for Xpert Xpress SARS-CoV-2/FLU/RSV testing.  Fact Sheet for Patients: EntrepreneurPulse.com.au  Fact Sheet for Healthcare Providers: IncredibleEmployment.be  This test is not yet approved or cleared by the [place] FDA and has been authorized for detection and/or diagnosis of SARS-CoV-2 by FDA under an Emergency Use Authorization (EUA). This EUA will remain in effect (meaning this test can be used) for the duration of the COVID-19 declaration under Section 564(b)(1) of the Act, 21 U.S.C. section 360bbb-3(b)(1), unless the authorization is terminated or revoked.  Performed at [facility], [address] [place], [place] [address]      Labs: BNP (last  3 results) No results for input(s): BNP in the last 8760 hours. Basic Metabolic Panel: Recent Labs  Lab 04/27/21 1826 04/27/21 1846 04/29/21 0902  NA 138 139 138  K 4.6 4.6 4.5  CL 107 105 106  CO2 24  --  24  GLUCOSE 94 92 103*  BUN 23 25* 26*  CREATININE 1.66* 1.70* 1.58*  CALCIUM 8.8*  --  8.7*   Liver Function Tests: Recent Labs  Lab 04/27/21 1826 04/29/21 0902  AST 23 25  ALT 12 14  ALKPHOS 67 75  BILITOT 0.7 0.9  PROT 6.0* 6.4*  ALBUMIN 3.5 3.6   No results for input(s): LIPASE, AMYLASE in the last 168  hours. No results for input(s): AMMONIA in the last 168 hours. CBC: Recent Labs  Lab 04/27/21 1826 04/27/21 1846 04/29/21 0902  WBC 9.6  --  7.7  NEUTROABS 6.1  --  5.3  HGB 14.4 15.0 14.4  HCT 44.1 44.0 44.4  MCV 95.9  --  96.7  PLT 280  --  256   Cardiac Enzymes: No results for input(s): CKTOTAL, CKMB, CKMBINDEX, TROPONINI in the last 168 hours. BNP: Invalid input(s): POCBNP CBG: Recent Labs  Lab 04/28/21 0825  GLUCAP 84   D-Dimer No results for input(s): DDIMER in the last 72 hours. Hgb A1c Recent Labs    04/30/21 0126  HGBA1C 5.8*   Lipid Profile Recent Labs    04/30/21 0126  CHOL 105  HDL 42  LDLCALC 50  TRIG 64  CHOLHDL 2.5   Thyroid function studies No results for input(s): TSH, T4TOTAL, T3FREE, THYROIDAB in the last 72 hours.  Invalid input(s): FREET3 Anemia work up No results for input(s): VITAMINB12, FOLATE, FERRITIN, TIBC, IRON, RETICCTPCT in the last 72 hours. Urinalysis    Component Value Date/Time   COLORURINE YELLOW 04/29/2021 1210   APPEARANCEUR CLEAR 04/29/2021 1210   APPEARANCEUR Clear 01/31/2016 1513   LABSPEC 1.010 04/29/2021 1210   PHURINE 5.5 04/29/2021 1210   GLUCOSEU NEGATIVE 04/29/2021 1210   HGBUR NEGATIVE 04/29/2021 1210   BILIRUBINUR NEGATIVE 04/29/2021 1210   BILIRUBINUR Negative 01/31/2016 1513   KETONESUR 15 (A) 04/29/2021 1210   PROTEINUR NEGATIVE 04/29/2021 1210   UROBILINOGEN 0.2 09/04/2012 1334   NITRITE NEGATIVE 04/29/2021 1210   LEUKOCYTESUR NEGATIVE 04/29/2021 1210   Sepsis Labs Invalid input(s): PROCALCITONIN,  WBC,  LACTICIDVEN Microbiology Recent Results (from the past 240 hour(s))  Resp Panel by RT-PCR (Flu A&B, Covid) Nasopharyngeal Swab     Status: None   Collection Time: 04/29/21  9:02 AM   Specimen: Nasopharyngeal Swab; Nasopharyngeal(NP) swabs in vial transport medium  Result Value Ref Range Status   SARS Coronavirus 2 by RT PCR NEGATIVE NEGATIVE Final    Comment: (NOTE) SARS-CoV-2 target  nucleic acids are NOT DETECTED.  The SARS-CoV-2 RNA is generally detectable in upper respiratory specimens during the acute phase of infection. The lowest concentration of SARS-CoV-2 viral copies this assay can detect is 138 copies/mL. A negative result does not preclude SARS-Cov-2 infection and should not be used as the sole basis for treatment or other patient management decisions. A negative result may occur with  improper specimen collection/handling, submission of specimen other than nasopharyngeal swab, presence of viral mutation(s) within the areas targeted by this assay, and inadequate number of viral copies(<138 copies/mL). A negative result must be combined with clinical observations, patient history, and epidemiological information. The expected result is Negative.  Fact Sheet for Patients:  EntrepreneurPulse.com.au  Fact Sheet for Healthcare Providers:  IncredibleEmployment.be  This test is no t yet approved or cleared by the [place] and  has been authorized for detection and/or diagnosis of SARS-CoV-2 by FDA under an Emergency Use Authorization (EUA). This EUA will remain  in effect (meaning this test can be used) for the duration of the COVID-19 declaration under Section 564(b)(1) of the Act, 21 U.S.C.section 360bbb-3(b)(1), unless the authorization is terminated  or revoked sooner.       Influenza A by PCR NEGATIVE NEGATIVE Final   Influenza B by PCR NEGATIVE NEGATIVE Final    Comment: (NOTE) The Xpert Xpress SARS-CoV-2/FLU/RSV plus assay is intended as an aid in the diagnosis of influenza from Nasopharyngeal swab specimens and should not be used as a sole basis for treatment. Nasal washings and aspirates are unacceptable for Xpert Xpress SARS-CoV-2/FLU/RSV testing.  Fact Sheet for Patients: EntrepreneurPulse.com.au  Fact Sheet for Healthcare  Providers: IncredibleEmployment.be  This test is not yet approved or cleared by the [place] FDA and has been authorized for detection and/or diagnosis of SARS-CoV-2 by FDA under an Emergency Use Authorization (EUA). This EUA will remain in effect (meaning this test can be used) for the duration of the COVID-19 declaration under Section 564(b)(1) of the Act, 21 U.S.C. section 360bbb-3(b)(1), unless the authorization is terminated or revoked.  Performed at [facility], [address] [place], [place] [address]      Time coordinating discharge: Over 30 minutes  SIGNED:   Phillips Climes, MD  [facility] 05/01/2021, 5:00 PM Pager   If 7PM-7AM, please contact night-coverage [email or web address] Password [email or web address]

## 2021-05-01 NOTE — TOC Transition Note (Signed)
Transition of Care [facility]) - CM/SW Discharge Note   Patient Details  Name: Charles Golden MRN: [id number] Date of Birth: 12/30/1925  [facility] CM/SW Contact:  Cyndi Bender, RN Phone Number: 05/01/2021, 12:41 PM   Clinical Narrative:    Patient stable for discharge. Patient lives independently but one his daughters will stay with him 24/7. He has transportation home.     Final next level of care: Home/Self Care Barriers to Discharge: Barriers Resolved   Patient Goals and CMS Choice Patient states their goals for this hospitalization and ongoing recovery are:: return home      Discharge Placement               home        Discharge Plan and Services   Discharge Planning Services: CM Consult                                 Social Determinants of Health (SDOH) Interventions     Readmission Risk Interventions No flowsheet data found.

## 2021-05-01 NOTE — ED Triage Notes (Signed)
Pt presents to ED POV. Pt c/o aphasia and dysarthria. Pt reports that these s/s have been intermittent since Thursday but new s/s began @ 1830.

## 2021-05-01 NOTE — Consult Note (Signed)
Chief Complaint: Patient was seen in consultation today for left ICA stenosis  Referring Physician(s): Egbert Garibaldi, MD  Supervising Physician: Pedro Earls  Patient Status: [facility] - In-pt  History of Present Illness: Charles Golden is a 87 y.o. male with a past medical history significant for GERD, COPD, CKD III who presented to [facility] ED on 12/1 with complaints of word finding difficulties/unsteady gait. He was found to have a left ICA occlusion and no intervention was recommended as his deficits had resolved. He was discharged on 12/2 with instructions to begin Plavix and ASA, unfortunately he began to experience similar symptoms and returned to the ED on 12/3. He was admitted and NIR has been consulted to discuss possible intervention.  Patient seen at bedside today with Dr Tennis Must Sindy Messing and his 2 daughters. He reports that his initial symptoms have completely resolved and he feels ok overall, he has no appetite which he attributes to grief due to his wife's recent passing. They were married 32 years and he is very sad without her. His daughters are wondering if there is someone in the hospital who can come see him about this to make sure he is getting enough nutrition. Discussed procedure indications, risks and benefits with patient and his daughters today - they are agreeable to proceed as an outpatient.  Past Medical History:  Diagnosis Date   Bronchitis, chronic (HCC)    COPD (chronic obstructive pulmonary disease) (HCC)    Enlarged prostate    GERD (gastroesophageal reflux disease)    Glaucoma    Glucagonoma    Hernia, incisional    at present   [facility] (hard of hearing)    IBS (irritable bowel syndrome)    Sciatic pain    Sinus congestion     Past Surgical History:  Procedure Laterality Date   BOWEL RESECTION N/A 09/04/2012   Procedure: SMALL BOWEL RESECTION;  Surgeon: Donato Heinz, MD;  Location: [facility];  Service: General;  Laterality: N/A;   Anastimosis   HEMORRHOID SURGERY     HERNIA REPAIR Bilateral 70's   INGUINAL HERNIA REPAIR Left 09/14/2013   Procedure: LEFT INGUINAL HERNIORRHAPHY;  Surgeon: Jamesetta So, MD;  Location: [facility];  Service: General;  Laterality: Left;   INGUINAL HERNIA REPAIR Right 12/23/2018   Procedure: RECURRENT RIGHT INGUINAL HERNIA  REPAIR  WITH MESH;  Surgeon: Aviva Signs, MD;  Location: [facility];  Service: General;  Laterality: Right;   INSERTION OF MESH Left 09/14/2013   Procedure: INSERTION OF MESH;  Surgeon: Jamesetta So, MD;  Location: [facility];  Service: General;  Laterality: Left;   KNEE ARTHROSCOPY Left 07/21/2014   Procedure: LEFT KNEE ARTHROSCOPY WITH MEDIAL MENISCAL DEBRIDEMENT ;  Surgeon: Gearlean Alf, MD;  Location: [facility];  Service: Orthopedics;  Laterality: Left;   LAPAROTOMY N/A 09/04/2012   Procedure: EXPLORATORY LAPAROTOMY;  Surgeon: Donato Heinz, MD;  Location: [facility];  Service: General;  Laterality: N/A;   SKIN LESION EXCISION     [name]   TONSILLECTOMY      Allergies: Demerol [meperidine] and Spiriva handihaler [tiotropium bromide monohydrate]  Medications: Prior to Admission medications   Medication Sig Start Date End Date Taking? Authorizing Provider  aspirin EC 325 MG EC tablet Take 1 tablet (325 mg total) by mouth daily. 04/29/21  Yes Ghimire, Henreitta Leber, MD  atorvastatin (LIPITOR) 40 MG tablet Take 1 tablet (40 mg total) by mouth daily. 04/29/21  Yes Ghimire, Henreitta Leber, MD  azelastine (ASTELIN) 0.1 % nasal spray PLACE 1 SPRAY IN EACH NOSTRIL ONCE A DAY AS DIRECTED Patient taking differently: Place 2 sprays into both nostrils every evening. 01/17/21  Yes Gottschalk, Ashly M, DO  BREO ELLIPTA 200-25 MCG/INH AEPB Inhale 1 puff into the lungs daily. 01/17/21  Yes Ronnie Doss M, DO  Cholecalciferol (VITAMIN D3) 2000 UNITS TABS Take 1 tablet by mouth daily.   Yes [provider]  clopidogrel (PLAVIX) 75 MG tablet Take 1 tablet (75 mg total) by mouth daily.  04/29/21  Yes Ghimire, Henreitta Leber, MD  Cyanocobalamin (VITAMIN B-12) 5000 MCG SUBL Place 1 tablet under the tongue daily.   Yes [provider]  finasteride (PROSCAR) 5 MG tablet Take 5 mg by mouth daily.   Yes [provider]  fluticasone (FLONASE) 50 MCG/ACT nasal spray Place 2 sprays into both nostrils daily.   Yes [provider]  guaiFENesin (MUCINEX) 600 MG 12 hr tablet Take 600 mg by mouth daily as needed for to loosen phlegm.   Yes [provider]  latanoprost (XALATAN) 0.005 % ophthalmic solution Place 1 drop into the left eye at bedtime. Patient taking differently: Place 1 drop into both eyes at bedtime. 06/01/16  Yes Chipper Herb, MD  tamsulosin (FLOMAX) 0.4 MG CAPS capsule Take 0.8 mg by mouth daily. 02/11/19  Yes [provider]  timolol (TIMOPTIC) 0.5 % ophthalmic solution Place 1 drop into the left eye every morning. 11/24/18  Yes [provider]  VENTOLIN HFA 108 (90 Base) MCG/ACT inhaler INHALE 2 PUFFS EVERY 6 HOURS AS NEEDED FOR WHEEZING OR SHORTNESS OF BREATH Patient taking differently: 1 puff every 6 (six) hours as needed for shortness of breath. 02/28/21  Yes Gottschalk, Ashly M, DO  pantoprazole (PROTONIX) 40 MG tablet Take 1 tablet (40 mg total) by mouth daily. 04/28/21 04/28/22  Ghimire, Henreitta Leber, MD     Family History  Problem Relation Age of Onset   Heart disease Mother        Valve replacement and pacemaker   Colon cancer Father    Asthma Other        cousin   Healthy Daughter    Healthy Daughter    Healthy Daughter     Social History   Socioeconomic History   Marital status: Married    Spouse name: [name]   Number of children: 3   Years of education: 12+   Highest education level: Bachelor's degree (e.g., BA, AB, BS)  Occupational History   Occupation: Retired    Comment: Immunologist  Tobacco Use   Smoking status: Former    Packs/day: 1.00    Years: 30.00    Pack years: 30.00    Types:  Cigarettes    Quit date: 05/28/2006    Years since quitting: 14.9   Smokeless tobacco: Never  Vaping Use   Vaping Use: Never used  Substance and Sexual Activity   Alcohol use: Not Currently    Alcohol/week: 0.0 standard drinks   Drug use: No   Sexual activity: Yes    Birth control/protection: None  Other Topics Concern   Not on file  Social History Narrative   Patient lives at home with [name].    Patient has 3 children.    Patient has his BS   Patient is retired.    Drinks about 2 cups of coffee per day.      [facility]:   He is still married. He previously worked as  a Immunologist. No significant dust exposure. He mostly worked with synthetic fibers. He is from [place].       Social Determinants of Health   Financial Resource Strain: Not on file  Food Insecurity: Not on file  Transportation Needs: Not on file  Physical Activity: Not on file  Stress: Not on file  Social Connections: Not on file     Review of Systems: A 12 point ROS discussed and pertinent positives are indicated in the HPI above.  All other systems are negative.  Review of Systems  Constitutional:  Positive for appetite change. Negative for chills and fever.  Respiratory:  Negative for cough and shortness of breath.   Cardiovascular:  Negative for chest pain.  Gastrointestinal:  Positive for constipation. Negative for abdominal pain, diarrhea and vomiting.  Neurological:  Negative for dizziness, tremors, syncope, facial asymmetry, speech difficulty, weakness, light-headedness, numbness and headaches.   Vital Signs: BP 115/72 (BP Location: Left Arm)   Pulse 67   Temp 97.9 F (36.6 C) (Oral)   Resp 16   Ht 5\' 10"  (1.778 m)   Wt 171 lb 11.8 oz (77.9 kg)   SpO2 96%   BMI 24.64 kg/m   Physical Exam Vitals and nursing note reviewed.  Constitutional:      General: He is not in acute distress.    Comments: Hard of hearing  HENT:     Head: Normocephalic.  Cardiovascular:      Rate and Rhythm: Normal rate.  Pulmonary:     Effort: Pulmonary effort is normal.  Skin:    General: Skin is warm and dry.  Neurological:     Mental Status: He is alert and oriented to person, place, and time.  Psychiatric:        Mood and Affect: Mood normal.        Behavior: Behavior normal.        Thought Content: Thought content normal.        Judgment: Judgment normal.     Imaging: MR BRAIN WO CONTRAST  Result Date: 04/29/2021 CLINICAL DATA:  Acute neurologic deficit EXAM: MRI HEAD WITHOUT CONTRAST TECHNIQUE: Multiplanar, multiecho pulse sequences of the brain and surrounding structures were obtained without intravenous contrast. COMPARISON:  None. FINDINGS: Brain: There are 2 areas of acute infarction within the left frontal lobe, the larger of which is in the insula. No acute hemorrhage. Normal white matter signal. Generalized volume loss without a clear lobar predilection. Focus of chronic microhemorrhage in the right cerebellum the midline structures are normal. Vascular: Magnetic susceptibility effect within the left MCA likely indicates a site of thrombus. Skull and upper cervical spine: Normal calvarium and skull base. Visualized upper cervical spine and soft tissues are normal. Sinuses/Orbits:No paranasal sinus fluid levels or advanced mucosal thickening. No mastoid or middle ear effusion. Normal orbits. IMPRESSION: 1. Two areas of acute infarction within the left frontal lobe, the larger of which is in the insula. No hemorrhage or mass effect. 2. Magnetic susceptibility effect within the left MCA likely indicates a site of thrombus. Electronically Signed   By: Ulyses Jarred M.D.   On: 04/29/2021 19:16   MR BRAIN WO CONTRAST  Result Date: 04/28/2021 CLINICAL DATA:  Acute neurologic deficit EXAM: MRI HEAD WITHOUT CONTRAST TECHNIQUE: Multiplanar, multiecho pulse sequences of the brain and surrounding structures were obtained without intravenous contrast. COMPARISON:  None. FINDINGS:  Brain: Small focus of abnormal diffusion restriction in the left insula. No other diffusion abnormality. Focus of chronic microhemorrhage  in the right cerebellum . Normal white matter signal. Generalized volume loss without a clear lobar predilection. The midline structures are normal. Vascular: Occluded left ICA.  The other flow voids are normal. Skull and upper cervical spine: Normal calvarium and skull base. Visualized upper cervical spine and soft tissues are normal. Sinuses/Orbits:No paranasal sinus fluid levels or advanced mucosal thickening. No mastoid or middle ear effusion. Normal orbits. IMPRESSION: 1. Small focus of acute ischemia in the left insula. No hemorrhage or mass effect. 2. Occluded left ICA. Electronically Signed   By: Ulyses Jarred M.D.   On: 04/28/2021 01:50   ECHOCARDIOGRAM COMPLETE  Result Date: 04/28/2021    ECHOCARDIOGRAM REPORT   Patient Name:   NUNZIO BANET Date of Exam: 04/28/2021 Medical Rec #:  [id number]     Height:       70.0 in Accession #:    [id number]    Weight:       170.0 lb Date of Birth:  Jan 10, 1926     BSA:          1.948 m Patient Age:    65 years      BP:           158/79 mmHg Patient Gender: M             HR:           63 bpm. Exam Location:  Inpatient Procedure: 2D Echo, Color Doppler and Cardiac Doppler Indications:    Stroke i63.9  History:        Patient has no prior history of Echocardiogram examinations.                 COPD.  Sonographer:    Raquel Sarna Senior RDCS Referring Phys: [id number] Candace Gallus MELVIN  Sonographer Comments: Technically difficult due to poor echo windows, repositioned at end of study for wall motion. IMPRESSIONS  1. Technically difficult study with limited views. Left ventricular ejection fraction, by estimation, is 60 to 65%. The left ventricle has normal function. The left ventricle has no regional wall motion abnormalities. Left ventricular diastolic parameters are consistent with Grade II diastolic dysfunction (pseudonormalization). Elevated  left atrial pressure.  2. Right ventricle is poorly visualized but appears grossly normal systolic function and mild to moderately dilated  3. Left atrial size was mildly dilated.  4. The mitral valve is normal in structure. No evidence of mitral valve regurgitation.  5. The aortic valve was not well visualized. Aortic valve regurgitation is not visualized. No aortic stenosis is present. FINDINGS  Left Ventricle: Left ventricular ejection fraction, by estimation, is 60 to 65%. The left ventricle has normal function. The left ventricle has no regional wall motion abnormalities. The left ventricular internal cavity size was normal in size. There is  no left ventricular hypertrophy. Left ventricular diastolic parameters are consistent with Grade II diastolic dysfunction (pseudonormalization). Elevated left atrial pressure. Right Ventricle: The right ventricular size is moderately enlarged. Right vetricular wall thickness was not well visualized. Right ventricular systolic function is normal. Left Atrium: Left atrial size was mildly dilated. Right Atrium: Right atrial size was normal in size. Pericardium: There is no evidence of pericardial effusion. Mitral Valve: The mitral valve is normal in structure. No evidence of mitral valve regurgitation. Tricuspid Valve: The tricuspid valve is normal in structure. Tricuspid valve regurgitation is trivial. Aortic Valve: The aortic valve was not well visualized. Aortic valve regurgitation is not visualized. No aortic stenosis is present. Pulmonic Valve: The pulmonic valve was  not well visualized. Pulmonic valve regurgitation is not visualized. Aorta: The aortic root is normal in size and structure. IAS/Shunts: The interatrial septum was not well visualized.  LEFT VENTRICLE PLAX 2D LVIDd:         4.20 cm   Diastology LVIDs:         2.70 cm   LV e' medial:    4.20 cm/s LV PW:         1.00 cm   LV E/e' medial:  13.5 LV IVS:        0.80 cm   LV e' lateral:   3.90 cm/s LVOT diam:      2.00 cm   LV E/e' lateral: 14.5 LV SV:         54 LV SV Index:   28 LVOT Area:     3.14 cm  RIGHT VENTRICLE RV S prime:     11.10 cm/s TAPSE (M-mode): 2.0 cm LEFT ATRIUM             Index        RIGHT ATRIUM           Index LA diam:        2.70 cm 1.39 cm/m   RA Area:     15.50 cm LA Vol (A2C):   69.9 ml 35.88 ml/m  RA Volume:   39.10 ml  20.07 ml/m LA Vol (A4C):   39.5 ml 20.28 ml/m LA Biplane Vol: 70.0 ml 35.93 ml/m  AORTIC VALVE LVOT Vmax:   66.10 cm/s LVOT Vmean:  49.600 cm/s LVOT VTI:    0.171 m  AORTA Ao Root diam: 3.30 cm MITRAL VALVE               TRICUSPID VALVE MV Area (PHT): 2.29 cm    TR Peak grad:   27.7 mmHg MV Decel Time: 331 msec    TR Vmax:        263.00 cm/s MV E velocity: 56.60 cm/s MV A velocity: 70.20 cm/s  SHUNTS MV E/A ratio:  0.81        Systemic VTI:  0.17 m                            Systemic Diam: 2.00 cm Oswaldo Milian MD Electronically signed by Oswaldo Milian MD Signature Date/Time: 04/28/2021/1:56:04 PM    Final    CT HEAD CODE STROKE WO CONTRAST`  Result Date: 04/29/2021 CLINICAL DATA:  Code stroke. 87 year old male with aphasia. Occluded left internal carotid artery and left MCA M2 branches on CTA 2 days ago. EXAM: CT HEAD WITHOUT CONTRAST TECHNIQUE: Contiguous axial images were obtained from the base of the skull through the vertex without intravenous contrast. COMPARISON:  [facility] CT head, CTA head and neck 04/27/2021. Brain MRI yesterday. FINDINGS: Brain: Small area of cytotoxic edema in the left insula corresponding to abnormal diffusion yesterday. Nearby hyperdense left MCA M2 branch in the sylvian fissure persist. But elsewhere gray-white matter differentiation remains stable since 04/27/2021. No acute intracranial hemorrhage identified. No midline shift, mass effect, or evidence of intracranial mass lesion. No ventriculomegaly. Vascular: Calcified atherosclerosis at the skull base. Persistent hyperdense left MCA M2 branch. Skull: No acute  osseous abnormality identified. Sinuses/Orbits: Visualized paranasal sinuses and mastoids are stable and well aerated. Other: No acute orbit or scalp soft tissue finding. ASPECTS [facility] Stroke Program Early CT Score) Total score (0-10 with 10 being normal): 9 (abnormal left insula as  seen by MRI yesterday). IMPRESSION: 1. Expected CT appearance of the small left insular infarct on MRI yesterday. Elsewhere stable gray-white matter differentiation since the CT on 04/27/2021. ASPECTS 9. 2. No intracranial hemorrhage or mass effect. Study discussed by telephone with Dr. Davonna Belling on 04/29/2021 at 09:19 . Electronically Signed   By: Genevie Ann M.D.   On: 04/29/2021 09:20   CT HEAD CODE STROKE WO CONTRAST  Result Date: 04/27/2021 CLINICAL DATA:  Code stroke.  Acute neuro deficit. EXAM: CT HEAD WITHOUT CONTRAST TECHNIQUE: Contiguous axial images were obtained from the base of the skull through the vertex without intravenous contrast. COMPARISON:  MRI head 05/14/2013.  CT head 03/11/2009 FINDINGS: Brain: Generalized atrophy. Negative for hydrocephalus. Negative for acute infarct, hemorrhage, mass. Vascular: Hyperdensity of the left middle cerebral artery in the sylvian fissure. Probable left M2 branch occlusion. Correlate with symptoms. Skull: Negative Sinuses/Orbits: Mild mucosal edema right maxillary sinus. Bilateral cataract extraction Other: None ASPECTS ([facility] Stroke Program Early CT Score) - Ganglionic level infarction (caudate, lentiform nuclei, internal capsule, insula, M1-M3 cortex): 7 - Supraganglionic infarction (M4-M6 cortex): 3 Total score (0-10 with 10 being normal): 10 IMPRESSION: 1. Negative for acute infarct or hemorrhage 2. Hyperdense left MCA branch in the sylvian fissure likely due to acute thrombosis. Correlate with symptoms. 3. ASPECTS is 10 4. Code stroke imaging results were communicated on 04/27/2021 at 6:39 pm to provider Lindzen via text page amion Electronically Signed   By: Franchot Gallo M.D.   On: 04/27/2021 18:46   CT ANGIO HEAD NECK W WO CM (CODE STROKE)  Result Date: 04/27/2021 CLINICAL DATA:  Acute neuro deficit. Speech abnormality which has cleared. EXAM: CT ANGIOGRAPHY HEAD AND NECK TECHNIQUE: Multidetector CT imaging of the head and neck was performed using the standard protocol during bolus administration of intravenous contrast. Multiplanar CT image reconstructions and MIPs were obtained to evaluate the vascular anatomy. Carotid stenosis measurements (when applicable) are obtained utilizing NASCET criteria, using the distal internal carotid diameter as the denominator. CONTRAST:  12mL OMNIPAQUE IOHEXOL 350 MG/ML SOLN COMPARISON:  CT head 04/27/2021 FINDINGS: CTA NECK FINDINGS Aortic arch: Atherosclerotic aortic arch and proximal great vessels. Proximal great vessels are patent. Right carotid system: Atherosclerotic calcification right carotid bifurcation without significant stenosis Left carotid system: Atherosclerotic disease at the left carotid bifurcation. Occlusion of the proximal left internal carotid artery. Left external carotid artery patent. Vertebral arteries: Both vertebral arteries are patent to the skull base with mild stenosis proximally on the right. Skeleton: Cervical spondylosis.  No acute skeletal abnormality. Other neck: Negative for mass or adenopathy Upper chest: Lung apices clear bilaterally. Review of the MIP images confirms the above findings CTA HEAD FINDINGS Anterior circulation: Right cavernous carotid widely patent. Right anterior and right middle cerebral arteries widely patent. Left internal carotid artery is occluded with reconstitution of the supraclinoid segment. Left anterior cerebral artery patent. Left M1 segment patent. Occlusion of the proximal left M2 branch inferior division which appears acute. Posterior circulation: Both vertebral arteries patent to the basilar. PICA patent bilaterally. Basilar widely patent. Superior cerebellar and  posterior cerebral arteries patent bilaterally. Venous sinuses: Normal venous enhancement Anatomic variants: None Review of the MIP images confirms the above findings IMPRESSION: 1. Occlusion of the proximal left internal carotid artery of indeterminate age. However, this is likely acute given the acute occlusion of the inferior division of the left MCA M2 branch. This was hyperdense on CT consistent with acute thrombus. 2. No significant right carotid stenosis. No  significant vertebral stenosis. 3. These results were called by telephone at the time of interpretation on 04/27/2021 at 7:24 pm to provider [facility] , who verbally acknowledged these results. Electronically Signed   By: Franchot Gallo M.D.   On: 04/27/2021 19:29   CT ANGIO HEAD NECK W WO CM W PERF (CODE STROKE)  Result Date: 04/29/2021 CLINICAL DATA:  Neuro deficit, acute, stroke suspected EXAM: CT ANGIOGRAPHY HEAD AND NECK CT PERFUSION BRAIN TECHNIQUE: Multidetector CT imaging of the head and neck was performed using the standard protocol during bolus administration of intravenous contrast. Multiplanar CT image reconstructions and MIPs were obtained to evaluate the vascular anatomy. Carotid stenosis measurements (when applicable) are obtained utilizing NASCET criteria, using the distal internal carotid diameter as the denominator. Multiphase CT imaging of the brain was performed following IV bolus contrast injection. Subsequent parametric perfusion maps were calculated using RAPID software. CONTRAST:  128mL OMNIPAQUE IOHEXOL 350 MG/ML SOLN COMPARISON:  Recent CTA and MRI brain FINDINGS: CTA NECK Aortic arch: Calcified plaque along the arch and patent great vessel origins. Right carotid system: Patent. Stable appearance. No hemodynamically significant stenosis at the ICA origin. Left carotid system: Patent common carotid. There has been some recanalization of the internal carotid artery. Persistent mixed plaque including irregular noncalcified  plaque along the proximal ICA with superimposed thrombus. There is focal near occlusion with submillimeter residual lumen. Vessel caliber remains small reflecting flow limitation. Vertebral arteries: Patent.  Stable appearance. Skeleton: Stable advanced cervical spine degenerative changes. Other neck: No new abnormality. Upper chest: Emphysema.  No new abnormality. Review of the MIP images confirms the above findings CTA HEAD Anterior circulation: As in the neck, there is no flow present within the left ICA. Left M1 MCA is patent. Persistent left M2 MCA occlusion although now slightly more distal. There is some reconstitution at the M2-M3 junction as before. Right middle and both anterior cerebral arteries are patent. Posterior circulation: Intracranial vertebral arteries are patent. Basilar artery is patent. Major cerebellar artery origins are patent. Posterior cerebral arteries are patent. Venous sinuses: Patent as allowed by contrast bolus timing. Review of the MIP images confirms the above findings CT Brain Perfusion Findings: CBF (<30%) Volume: 69mL Perfusion (Tmax>6.0s) volume: 72mL Mismatch Volume: 22mL Infarction Location: Left MCA territory IMPRESSION: Interval partial recanalization of the cervical left ICA. Remains focal near occlusion with submillimeter lumen and decreased distal vessel caliber reflecting flow limitation. Intracranially, the left ICA is now patent. There is a persistent left M2 MCA occlusion, but the occlusion now occurs more distally within the sylvian fissure. Some distal reconstitution is present. Perfusion imaging demonstrates no core infarction and 83 mL of penumbra in the left MCA territory. Electronically Signed   By: Macy Mis M.D.   On: 04/29/2021 10:01    Labs:  CBC: Recent Labs    02/01/21 1227 03/06/21 1243 04/27/21 1826 04/27/21 1846 04/29/21 0902  WBC 14.8* 7.4 9.6  --  7.7  HGB 15.2 14.0 14.4 15.0 14.4  HCT 47.2 42.8 44.1 44.0 44.4  PLT 300.0 244.0 280   --  256    COAGS: Recent Labs    04/27/21 1826 04/29/21 0902  INR 1.1 1.1  APTT 28 27    BMP: Recent Labs    06/10/20 1032 12/13/20 1429 02/01/21 1227 04/27/21 1826 04/27/21 1846 04/29/21 0902  NA 145* 142 139 138 139 138  K 5.2 5.1 5.2 No hemolysis seen* 4.6 4.6 4.5  CL 108* 105 106 107 105 106  CO2 22  24 25 24   --  24  GLUCOSE 85 98 107* 94 92 103*  BUN 20 21 31* 23 25* 26*  CALCIUM 9.5 9.8 9.6 8.8*  --  8.7*  CREATININE 1.55* 1.49* 1.53* 1.66* 1.70* 1.58*  GFRNONAA 38*  --   --  38*  --  41*  GFRAA 44*  --   --   --   --   --     LIVER FUNCTION TESTS: Recent Labs    12/13/20 1429 02/01/21 1227 04/27/21 1826 04/29/21 0902  BILITOT  --  0.5 0.7 0.9  AST  --  18 23 25   ALT  --  12 12 14   ALKPHOS  --  88 67 75  PROT  --  6.8 6.0* 6.4*  ALBUMIN 4.1 3.9 3.5 3.6    TUMOR MARKERS: No results for input(s): AFPTM, CEA, CA199, CHROMGRNA in the last 8760 hours.  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 Patient seen at bedside with his 2 daughters present - we reviewed the indications for left ICA stent placement as well as delayed timing of procedure to allow time for thrombus to hopefully improve prior to intervention. They are all in agreement that they would like to proceed with left ICA angioplasty/stent placement.   Plan for patient to return week of 12/12 for left ICA angioplasty/stent placement with anesthesia - patient to continue Plavix 75 mg PO QD + ASA 325 mg PO QD upon discharge. NIR scheduler will call patient with date/time.   Ok for discharge from [place] - ultimate d/c planning per primary team. Please call with questions or concerns.  Thank you for this interesting consult.  I greatly enjoyed meeting [facility]U RECORD and look forward to participating in their care.   A copy of this report was sent to the requesting provider on this date.  Electronically Signed: Joaquim Nam, PA-C 05/01/2021, 9:46 AM   I spent a total of 40 Minutes in face to face in clinical consultation, greater than 50% of which was counseling/coordinating care for left ICA angioplasty/stent placement.

## 2021-05-01 NOTE — Code Documentation (Signed)
Stroke Response Nurse Documentation Code Documentation  Charles Golden is a 87 y.o. male arriving to [facility] ED via Private Vehicle on 12/5 with past medical hx of Left MCA M2 occlusion 12/1, GERD, HOH, COPD. On aspirin 325 mg daily and clopidogrel 75 mg daily. Code stroke was activated by EMS.   Patient from home where he was LKW at [facility] and now complaining of dysarthria .   Stroke team at the bedside on patient arrival. Labs drawn and patient cleared for CT by Dr. Francia Greaves. Patient to CT with team. NIHSS 0, see documentation for details and code stroke times. Patient with no deficits on exam. The following imaging was completed:  CT.  Patient is not a candidate for IV Thrombolytic due to recent CVA.     Bedside handoff with ED RN Leonarda Salon, Leonie Green  Rapid Response RN

## 2021-05-01 NOTE — ED Provider Notes (Signed)
Emergency Medicine Provider Triage Evaluation Note  Charles Golden , a 87 y.o. male  was evaluated in triage.  Pt presents with daughter who states patient is having slurred speech. Patient has been admitted and seen twice since Thursday for stroke like symptoms. After discussion with neurology, code strike will be initiated and patient will be placed in room.   Review of Systems  Positive: Slurred speech, weakness, lightheaded, SOB Negative: CP, abdominal pain  Physical Exam  BP (!) 104/94 (BP Location: Left Arm)   Pulse 74   Temp 97.6 F (36.4 C) (Oral)   Resp 18   SpO2 97%  Gen:   Awake, no distress   Resp:  Normal effort  MSK:   Moves extremities without difficulty  Other:  Patient CN 2-12 intact. Patient cerebellar function intact. Patient has no pronator drift. Patient strength 5/5 bilaterally in UE and LE. Patient sensation fully intact. Only deficit noted on patient is slurred speech.  Medical Decision Making  Medically screening exam initiated at 8:04 PM.  Appropriate orders placed.  Charles Golden was informed that the remainder of the evaluation will be completed by another provider, this initial triage assessment does not replace that evaluation, and the importance of remaining in the ED until their evaluation is complete.  Spoke with Dr. Erlinda Hong of neurology who suggested activating code stroke, laying patient flat in trendelenburg and giving him 1L bolus of fluid due to low blood pressure.   Charles Golden 05/01/21 2009    Charles Pence, MD 05/01/21 2105

## 2021-05-01 NOTE — H&P (Signed)
History and Physical   Charles Golden:[id number] DOB: [birth year] DOA: 05/01/2021  PCP: [name], DO   Patient coming from: Home  Chief Complaint: Aphasia, dysarthria  HPI: Charles Golden is a 87 y.o. male with medical history significant of hearing loss, BPH, bronchiectasis, COPD, GERD, IBS, RLS, CKD presenting after continued intermittent episodes of aphasia and dysarthria.  Patient initially presented on 12/1 were I admitted him for acute stroke secondary to ICA and M2 occlusion.  Patient had stroke work-up at that time and also was readmitted for recurrent symptoms 2 days ago and discharged earlier today.  Symptoms had continued to return intermittently and had an episode of aphasia and dysarthria today as well.  Family also reports has had decreased p.o. intake.   He denies fevers, chills, chest pain, shortness of breath, abdominal pain, constipation, diarrhea, nausea, vomiting.   ED Course: Vital signs in the ED are stable.  Lab work-up showed CMP with sodium 131, bicarb 21, creatinine 1.71 from baseline of 1.5, glucose 109, protein 6.1, albumin 3.3.  CBC within normal limits.  PT, PTT, INR within normal limits.  UDS, urinalysis pending.  Ethanol level normal.  CT head showed no acute normality and did demonstrate known recent left insular infarct.  Patient received a liter of fluids in the ED was started on a rate of IV fluids.  Neurology was consulted and plan is for observing overnight with possible neuro interventional radiology intervention tomorrow which had originally been planned for outpatient.  Review of Systems: As per HPI otherwise all other systems reviewed and are negative.  Past Medical History:  Diagnosis Date   Bronchitis, chronic (HCC)    COPD (chronic obstructive pulmonary disease) (HCC)    Enlarged prostate    GERD (gastroesophageal reflux disease)    Glaucoma    Glucagonoma    Hernia, incisional    at present   [facility] (hard of hearing)    IBS  (irritable bowel syndrome)    Sciatic pain    Sinus congestion     Past Surgical History:  Procedure Laterality Date   BOWEL RESECTION N/A 09/04/2012   Procedure: SMALL BOWEL RESECTION;  Surgeon: Donato Heinz, MD;  Location: [facility];  Service: General;  Laterality: N/A;  Anastimosis   HEMORRHOID SURGERY     HERNIA REPAIR Bilateral 70's   INGUINAL HERNIA REPAIR Left 09/14/2013   Procedure: LEFT INGUINAL HERNIORRHAPHY;  Surgeon: Jamesetta So, MD;  Location: [facility];  Service: General;  Laterality: Left;   INGUINAL HERNIA REPAIR Right 12/23/2018   Procedure: RECURRENT RIGHT INGUINAL HERNIA  REPAIR  WITH MESH;  Surgeon: Aviva Signs, MD;  Location: [facility];  Service: General;  Laterality: Right;   INSERTION OF MESH Left 09/14/2013   Procedure: INSERTION OF MESH;  Surgeon: Jamesetta So, MD;  Location: [facility];  Service: General;  Laterality: Left;   KNEE ARTHROSCOPY Left 07/21/2014   Procedure: LEFT KNEE ARTHROSCOPY WITH MEDIAL MENISCAL DEBRIDEMENT ;  Surgeon: Gearlean Alf, MD;  Location: [facility];  Service: Orthopedics;  Laterality: Left;   LAPAROTOMY N/A 09/04/2012   Procedure: EXPLORATORY LAPAROTOMY;  Surgeon: Donato Heinz, MD;  Location: [facility];  Service: General;  Laterality: N/A;   SKIN LESION EXCISION     [name]   TONSILLECTOMY      Social History  reports that he quit smoking about 14 years ago. His smoking use included cigarettes. He has a 30.00 pack-year smoking history. He has never used smokeless  tobacco. He reports that he does not currently use alcohol. He reports that he does not use drugs.  Allergies  Allergen Reactions   Demerol [Meperidine] Other (See Comments)    Pt. States "he woke up during a colonoscopy"   Spiriva Handihaler [Tiotropium Bromide Monohydrate] Other (See Comments)    Mild Urinary Retention    Family History  Problem Relation Age of Onset   Heart disease Mother        Valve replacement and pacemaker   Colon cancer Father    Asthma Other         cousin   Healthy Daughter    Healthy Daughter    Healthy Daughter   Reviewed on admission  Prior to Admission medications   Medication Sig Start Date End Date Taking? Authorizing Provider  aspirin EC 325 MG EC tablet Take 1 tablet (325 mg total) by mouth daily. 04/29/21   Ghimire, Henreitta Leber, MD  atorvastatin (LIPITOR) 40 MG tablet Take 1 tablet (40 mg total) by mouth daily. 04/29/21   Ghimire, Henreitta Leber, MD  azelastine (ASTELIN) 0.1 % nasal spray PLACE 1 SPRAY IN EACH NOSTRIL ONCE A DAY AS DIRECTED Patient taking differently: Place 2 sprays into both nostrils every evening. 01/17/21   Gottschalk, Ashly M, DO  BREO ELLIPTA 200-25 MCG/INH AEPB Inhale 1 puff into the lungs daily. 01/17/21   [name], DO  Cholecalciferol (VITAMIN D3) 2000 UNITS TABS Take 1 tablet by mouth daily.    [provider]  clopidogrel (PLAVIX) 75 MG tablet Take 1 tablet (75 mg total) by mouth daily. 04/29/21   Ghimire, Henreitta Leber, MD  Cyanocobalamin (VITAMIN B-12) 5000 MCG SUBL Place 1 tablet under the tongue daily.    [provider]  finasteride (PROSCAR) 5 MG tablet Take 5 mg by mouth daily.    [provider]  fluticasone (FLONASE) 50 MCG/ACT nasal spray Place 2 sprays into both nostrils daily.    [provider]  guaiFENesin (MUCINEX) 600 MG 12 hr tablet Take 600 mg by mouth daily as needed for to loosen phlegm.    [provider]  latanoprost (XALATAN) 0.005 % ophthalmic solution Place 1 drop into the left eye at bedtime. Patient taking differently: Place 1 drop into both eyes at bedtime. 06/01/16   Chipper Herb, MD  pantoprazole (PROTONIX) 40 MG tablet Take 1 tablet (40 mg total) by mouth daily. 04/28/21 04/28/22  Ghimire, Henreitta Leber, MD  tamsulosin (FLOMAX) 0.4 MG CAPS capsule Take 0.8 mg by mouth daily. 02/11/19   [provider]  timolol (TIMOPTIC) 0.5 % ophthalmic solution Place 1 drop into the left eye every morning. 11/24/18   [provider]  VENTOLIN HFA 108 (90 Base) MCG/ACT inhaler INHALE 2 PUFFS EVERY 6 HOURS AS NEEDED FOR WHEEZING OR SHORTNESS OF BREATH Patient taking differently: 1 puff every 6 (six) hours as needed for shortness of breath. 02/28/21   [name], DO    Physical Exam: Vitals:   05/01/21 1946 05/01/21 2037 05/01/21 2045 05/01/21 2100  BP:  134/75 136/73 133/66  Pulse:  67 67 73  Resp:  20 18 15   Temp: 97.6 F (36.4 C) 98 F (36.7 C)    TempSrc: Oral Oral    SpO2:  99% 98% 99%   Physical Exam Constitutional:      General: He is not in acute distress.    Appearance: Normal appearance.  HENT:     Head: Normocephalic and atraumatic.  Mouth/Throat:     Mouth: Mucous membranes are moist.     Pharynx: Oropharynx is clear.  Eyes:     Extraocular Movements: Extraocular movements intact.     Pupils: Pupils are equal, round, and reactive to light.  Cardiovascular:     Rate and Rhythm: Normal rate and regular rhythm.     Pulses: Normal pulses.     Heart sounds: Normal heart sounds.  Pulmonary:     Effort: Pulmonary effort is normal. No respiratory distress.     Breath sounds: Normal breath sounds.  Abdominal:     General: Bowel sounds are normal. There is no distension.     Palpations: Abdomen is soft.     Tenderness: There is no abdominal tenderness.  Musculoskeletal:        General: No swelling or deformity.  Skin:    General: Skin is warm and dry.  Neurological:     Comments: Mental Status: Patient is awake, alert, oriented x3 No signs of aphasia or neglect Cranial Nerves: II: Pupils equal, round, and reactive to light.   III,IV, VI: EOMI without ptosis or diploplia.  V: Facial sensation is symmetric to light touch. VII: Facial movement is symmetric.  VIII: hearing is intact to voice X: Uvula elevates symmetrically XI: Shoulder shrug is symmetric. XII: tongue is midline without atrophy or fasciculations.  Motor: Good effort thorughout, at Least 5/5 bilateral UE, 5/5  bilateral lower extremitiy  Sensory: Sensation is grossly intact bilateral UEs & LEs Cerebellar: Finger-Nose intact bilalat    Labs on Admission: I have personally reviewed following labs and imaging studies  CBC: Recent Labs  Lab 04/27/21 1826 04/27/21 1846 04/29/21 0902 05/01/21 2018 05/01/21 2039  WBC 9.6  --  7.7 8.5  --   NEUTROABS 6.1  --  5.3 6.0  --   HGB 14.4 15.0 14.4 14.5 14.6  HCT 44.1 44.0 44.4 43.6 43.0  MCV 95.9  --  96.7 93.6  --   PLT 280  --  256 255  --     Basic Metabolic Panel: Recent Labs  Lab 04/27/21 1826 04/27/21 1846 04/29/21 0902 05/01/21 2018 05/01/21 2039  NA 138 139 138 131* 133*  K 4.6 4.6 4.5 4.4 4.4  CL 107 105 106 101 100  CO2 24  --  24 21*  --   GLUCOSE 94 92 103* 109* 106*  BUN 23 25* 26* 21 24*  CREATININE 1.66* 1.70* 1.58* 1.71* 1.80*  CALCIUM 8.8*  --  8.7* 8.9  --     GFR: Estimated Creatinine Clearance: 26.5 mL/min (A) (by C-G formula based on SCr of 1.8 mg/dL (H)).  Liver Function Tests: Recent Labs  Lab 04/27/21 1826 04/29/21 0902 05/01/21 2018  AST 23 25 25   ALT 12 14 14   ALKPHOS 67 75 80  BILITOT 0.7 0.9 0.8  PROT 6.0* 6.4* 6.1*  ALBUMIN 3.5 3.6 3.3*    Urine analysis:    Component Value Date/Time   COLORURINE YELLOW 04/29/2021 1210   APPEARANCEUR CLEAR 04/29/2021 1210   APPEARANCEUR Clear 01/31/2016 1513   LABSPEC 1.010 04/29/2021 1210   PHURINE 5.5 04/29/2021 1210   GLUCOSEU NEGATIVE 04/29/2021 1210   HGBUR NEGATIVE 04/29/2021 1210   BILIRUBINUR NEGATIVE 04/29/2021 1210   BILIRUBINUR Negative 01/31/2016 1513   KETONESUR 15 (A) 04/29/2021 1210   PROTEINUR NEGATIVE 04/29/2021 1210   UROBILINOGEN 0.2 09/04/2012 1334   NITRITE NEGATIVE 04/29/2021 1210   LEUKOCYTESUR NEGATIVE 04/29/2021 1210    Radiological Exams on Admission:  CT HEAD CODE STROKE WO CONTRAST  Result Date: 05/01/2021 CLINICAL DATA:  Code stroke.  Acute neurologic deficit EXAM: CT HEAD WITHOUT CONTRAST TECHNIQUE: Contiguous axial  images were obtained from the base of the skull through the vertex without intravenous contrast. COMPARISON:  Brain MRI 08/06/2020 FINDINGS: Brain: There is no mass, hemorrhage or extra-axial collection. There is generalized atrophy without lobar predilection. There is hypoattenuation of the periventricular white matter, most commonly indicating chronic ischemic microangiopathy. Hypoattenuation of the site of recent left insular infarct. Vascular: No abnormal hyperdensity of the major intracranial arteries or dural venous sinuses. No intracranial atherosclerosis. Skull: The visualized skull base, calvarium and extracranial soft tissues are normal. Sinuses/Orbits: No fluid levels or advanced mucosal thickening of the visualized paranasal sinuses. No mastoid or middle ear effusion. The orbits are normal. ASPECTS [facility] Stroke Program Early CT Score) - Ganglionic level infarction (caudate, lentiform nuclei, internal capsule, insula, M1-M3 cortex): 7 - Supraganglionic infarction (M4-M6 cortex): 3 Total score (0-10 with 10 being normal): 10 IMPRESSION: 1. No acute intracranial abnormality. 2. ASPECTS is 10. 3. A recent left insular infarct, as demonstrated on MRI 04/29/2021. These results were called by telephone at the time of interpretation on 05/01/2021 at 8:41 pm to Dr. Erlinda Hong, Who verbally acknowledged these results. Electronically Signed   By: Ulyses Jarred M.D.   On: 05/01/2021 20:42    EKG: Independently reviewed.  Sinus rhythm at 66 bpm.  Nonspecific T wave flattening.  Low voltage in aVL and V1.  Assessment/Plan Principal Problem:   CVA (cerebral vascular accident) ([place]) Active Problems:   BPH (benign prostatic hyperplasia)   COPD GOLD I    GERD without esophagitis  CVA > Patient with recurrent admissions for intermittent stroke symptoms of aphasia and dysarthria. > Initially presented on 12/1 found to have occluded left ICA and M2.  With left insular infarct.  Plan was for outpatient neuro  interventional radiology with stenting.  Had recurrent symptoms and was admitted from 12/3 until this morning. > Had recurrent symptoms again today which have resolved now in the ED.  CT head does not show any new infarcts. > Neurology seen the patient in the ED plan is to likely move up intervention to this admission otherwise observing overnight. > Previous work-up showed echo with EF 60-65% G2 DD.  LDL 72.  A1c 6.0.  Patient started on aspirin, Plavix, Lipitor. - Appreciate neurology recommendations - Monitor on telemetry - Blood pressure goal [id number] systolic - Neuro checks - Further work-up per neurology recommendations - Continue aspirin, Plavix, atorvastatin  COPD - Continue home Breo and as needed albuterol  BPH - Continue home finasteride  GERD - Protonix  DVT prophylaxis: SCDs, possible neuro interventional radiology intervention tomorrow  Code Status:   Full  Family Communication:  None on admission.  Patient states his daughter just left and is up-to-date on the plan. Disposition Plan:   Patient is from:  Home  Anticipated DC to:  Home  Anticipated DC date:  1 to 3 days  Anticipated DC barriers: None  Consults called:  Neurology consulted by EDP.   Admission status:  Observation, telemetry   Severity of Illness: The appropriate patient status for this patient is OBSERVATION. Observation status is judged to be reasonable and necessary in order to provide the required intensity of service to ensure the patient's safety. [name] presenting symptoms, physical exam findings, and initial radiographic and laboratory data in the context of their medical condition is felt to place them at decreased  risk for further clinical deterioration. Furthermore, it is anticipated that the patient Charles be medically stable for discharge from the hospital within 2 midnights of admission.    Marcelyn Bruins MD [facility]  How to contact the [facility] Attending or Consulting  provider [place] or covering provider during after hours [place], for this patient?   Check the care team in [facility] and look for a) attending/consulting [facility] provider listed and b) the [facility] team listed Log into [email or web address] and use [place]'s universal password to access. If you do not have the password, please contact the hospital operator. Locate the [facility] provider you are looking for under [facility] and page to a number that you can be directly reached. If you still have difficulty reaching the provider, please page the [facility] (Director on Call) for the Hospitalists listed on amion for assistance.  05/01/2021, 9:57 PM

## 2021-05-01 NOTE — ED Provider Notes (Signed)
[facility] EMERGENCY DEPARTMENT Provider Note   CSN: [id number] Arrival date & time: 05/01/21  1929     History Chief Complaint  Patient presents with   Aphasia    Charles Golden is a 87 y.o. male.  87 year old male with prior medical history as detailed below presents for evaluation.  Patient was just discharged earlier today from this facility.  Patient returns with complaint of "difficulty finding his words" and "slurred speech."  At the time of my evaluation, patient has returned to baseline.  Details of recent admission and work-up reviewed in chart.  Code stroke initiated by triage.  Neurology consulting, they request medicine admission.  The history is provided by the patient.  Illness Location:  Slurred speech, dysarthria Severity:  Mild Onset quality:  Sudden Timing:  Unable to specify Progression:  Resolved Chronicity:  Recurrent     Past Medical History:  Diagnosis Date   Bronchitis, chronic (HCC)    COPD (chronic obstructive pulmonary disease) (HCC)    Enlarged prostate    GERD (gastroesophageal reflux disease)    Glaucoma    Glucagonoma    Hernia, incisional    at present   [facility] (hard of hearing)    IBS (irritable bowel syndrome)    Sciatic pain    Sinus congestion     Patient Active Problem List   Diagnosis Date Noted   Acute ischemic stroke ([place]) 04/29/2021   TIA (transient ischemic attack) 04/27/2021   Chronic tension-type headache, not intractable 12/13/2020   Bilateral sensorineural hearing loss 09/25/2019   Bronchiectasis without complication ([place]) 63/05/6008   Fatigue 05/27/2019   Vitamin D deficiency 01/21/2019   Weakness 01/21/2019   Recurrent right inguinal hernia    Presbycusis of both ears 07/19/2017   Restless leg syndrome 11/25/2016   Collagenous colitis 07/24/2016   Deviated nasal septum 09/03/2015   Basal cell carcinoma of nose 09/03/2015   Thoracic aortic atherosclerosis ([place]) 05/03/2015   Upper  airway cough syndrome 04/28/2015   GERD without esophagitis 04/11/2015   Acute medial meniscal tear 07/20/2014   COPD GOLD I  12/08/2013   Small bowel volvulus ([place]) 08/05/2013   Chronic headache 08/05/2013   Left inguinal hernia 08/05/2013   Incisional hernia, without obstruction or gangrene 08/05/2013   Headache 05/01/2013   Obstructive chronic bronchitis without exacerbation ([place]) 04/30/2013   Left hip pain 04/29/2013   BPH (benign prostatic hyperplasia) 01/05/2013   IBS (irritable bowel syndrome) 09/03/2012   Chronic rhinosinusitis 09/03/2012   Lung nodules 05/04/2003    Past Surgical History:  Procedure Laterality Date   BOWEL RESECTION N/A 09/04/2012   Procedure: SMALL BOWEL RESECTION;  Surgeon: Donato Heinz, MD;  Location: [facility];  Service: General;  Laterality: N/A;  Anastimosis   HEMORRHOID SURGERY     HERNIA REPAIR Bilateral 70's   INGUINAL HERNIA REPAIR Left 09/14/2013   Procedure: LEFT INGUINAL HERNIORRHAPHY;  Surgeon: Jamesetta So, MD;  Location: [facility];  Service: General;  Laterality: Left;   INGUINAL HERNIA REPAIR Right 12/23/2018   Procedure: RECURRENT RIGHT INGUINAL HERNIA  REPAIR  WITH MESH;  Surgeon: Aviva Signs, MD;  Location: [facility];  Service: General;  Laterality: Right;   INSERTION OF MESH Left 09/14/2013   Procedure: INSERTION OF MESH;  Surgeon: Jamesetta So, MD;  Location: [facility];  Service: General;  Laterality: Left;   KNEE ARTHROSCOPY Left 07/21/2014   Procedure: LEFT KNEE ARTHROSCOPY WITH MEDIAL MENISCAL DEBRIDEMENT ;  Surgeon: Gearlean Alf, MD;  Location: [facility];  Service: Orthopedics;  Laterality: Left;   LAPAROTOMY N/A 09/04/2012   Procedure: EXPLORATORY LAPAROTOMY;  Surgeon: Donato Heinz, MD;  Location: [facility];  Service: General;  Laterality: N/A;   SKIN LESION EXCISION     [name]   TONSILLECTOMY         Family History  Problem Relation Age of Onset   Heart disease Mother        Valve replacement and pacemaker   Colon cancer  Father    Asthma Other        cousin   Healthy Daughter    Healthy Daughter    Healthy Daughter     Social History   Tobacco Use   Smoking status: Former    Packs/day: 1.00    Years: 30.00    Pack years: 30.00    Types: Cigarettes    Quit date: 05/28/2006    Years since quitting: 14.9   Smokeless tobacco: Never  Vaping Use   Vaping Use: Never used  Substance Use Topics   Alcohol use: Not Currently    Alcohol/week: 0.0 standard drinks   Drug use: No    Home Medications Prior to Admission medications   Medication Sig Start Date End Date Taking? Authorizing Provider  aspirin EC 325 MG EC tablet Take 1 tablet (325 mg total) by mouth daily. 04/29/21   Ghimire, Henreitta Leber, MD  atorvastatin (LIPITOR) 40 MG tablet Take 1 tablet (40 mg total) by mouth daily. 04/29/21   Ghimire, Henreitta Leber, MD  azelastine (ASTELIN) 0.1 % nasal spray PLACE 1 SPRAY IN EACH NOSTRIL ONCE A DAY AS DIRECTED Patient taking differently: Place 2 sprays into both nostrils every evening. 01/17/21   Gottschalk, Ashly M, DO  BREO ELLIPTA 200-25 MCG/INH AEPB Inhale 1 puff into the lungs daily. 01/17/21   [name], DO  Cholecalciferol (VITAMIN D3) 2000 UNITS TABS Take 1 tablet by mouth daily.    [provider]  clopidogrel (PLAVIX) 75 MG tablet Take 1 tablet (75 mg total) by mouth daily. 04/29/21   Ghimire, Henreitta Leber, MD  Cyanocobalamin (VITAMIN B-12) 5000 MCG SUBL Place 1 tablet under the tongue daily.    [provider]  finasteride (PROSCAR) 5 MG tablet Take 5 mg by mouth daily.    [provider]  fluticasone (FLONASE) 50 MCG/ACT nasal spray Place 2 sprays into both nostrils daily.    [provider]  guaiFENesin (MUCINEX) 600 MG 12 hr tablet Take 600 mg by mouth daily as needed for to loosen phlegm.    [provider]  latanoprost (XALATAN) 0.005 % ophthalmic solution Place 1 drop into the left eye at bedtime. Patient taking differently: Place 1 drop into both  eyes at bedtime. 06/01/16   Chipper Herb, MD  pantoprazole (PROTONIX) 40 MG tablet Take 1 tablet (40 mg total) by mouth daily. 04/28/21 04/28/22  Ghimire, Henreitta Leber, MD  tamsulosin (FLOMAX) 0.4 MG CAPS capsule Take 0.8 mg by mouth daily. 02/11/19   [provider]  timolol (TIMOPTIC) 0.5 % ophthalmic solution Place 1 drop into the left eye every morning. 11/24/18   [provider]  VENTOLIN HFA 108 (90 Base) MCG/ACT inhaler INHALE 2 PUFFS EVERY 6 HOURS AS NEEDED FOR WHEEZING OR SHORTNESS OF BREATH Patient taking differently: 1 puff every 6 (six) hours as needed for shortness of breath. 02/28/21   [name], DO    Allergies    Demerol [meperidine] and Spiriva handihaler [tiotropium  bromide monohydrate]  Review of Systems   Review of Systems  All other systems reviewed and are negative.  Physical Exam Updated Vital Signs BP 134/75   Pulse 67   Temp 98 F (36.7 C) (Oral)   Resp 20   SpO2 99%   Physical Exam Vitals and nursing note reviewed.  Constitutional:      General: He is not in acute distress.    Appearance: Normal appearance. He is well-developed.  HENT:     Head: Normocephalic and atraumatic.  Eyes:     Conjunctiva/sclera: Conjunctivae normal.     Pupils: Pupils are equal, round, and reactive to light.  Cardiovascular:     Rate and Rhythm: Normal rate and regular rhythm.     Heart sounds: Normal heart sounds.  Pulmonary:     Effort: Pulmonary effort is normal. No respiratory distress.     Breath sounds: Normal breath sounds.  Abdominal:     General: There is no distension.     Palpations: Abdomen is soft.     Tenderness: There is no abdominal tenderness.  Musculoskeletal:        General: No deformity. Normal range of motion.     Cervical back: Normal range of motion and neck supple.  Skin:    General: Skin is warm and dry.  Neurological:     General: No focal deficit present.     Mental Status: He is alert and oriented to person, place,  and time.    ED Results / Procedures / Treatments   Labs (all labs ordered are listed, but only abnormal results are displayed) Labs Reviewed  COMPREHENSIVE METABOLIC PANEL - Abnormal; Notable for the following components:      Result Value   Sodium 131 (*)    CO2 21 (*)    Glucose, Bld 109 (*)    Creatinine, Ser 1.71 (*)    Total Protein 6.1 (*)    Albumin 3.3 (*)    GFR, Estimated 37 (*)    All other components within normal limits  I-STAT CHEM 8, ED - Abnormal; Notable for the following components:   Sodium 133 (*)    BUN 24 (*)    Creatinine, Ser 1.80 (*)    Glucose, Bld 106 (*)    Calcium, Ion 1.10 (*)    All other components within normal limits  RESP PANEL BY RT-PCR (FLU A&B, COVID) ARPGX2  ETHANOL  PROTIME-INR  APTT  CBC  DIFFERENTIAL  RAPID URINE DRUG SCREEN, HOSP PERFORMED  URINALYSIS, ROUTINE W REFLEX MICROSCOPIC  CBC  BASIC METABOLIC PANEL    EKG EKG Interpretation  Date/Time:  Monday May 01 2021 20:38:37 EST Ventricular Rate:  66 PR Interval:  149 QRS Duration: 90 QT Interval:  393 QTC Calculation: 412 R Axis:   66 Text Interpretation: Sinus rhythm Borderline T wave abnormalities Confirmed by Dene Gentry [phone]) on 05/01/2021 8:42:53 PM  Radiology CT HEAD CODE STROKE WO CONTRAST  Result Date: 05/01/2021 CLINICAL DATA:  Code stroke.  Acute neurologic deficit EXAM: CT HEAD WITHOUT CONTRAST TECHNIQUE: Contiguous axial images were obtained from the base of the skull through the vertex without intravenous contrast. COMPARISON:  Brain MRI 08/06/2020 FINDINGS: Brain: There is no mass, hemorrhage or extra-axial collection. There is generalized atrophy without lobar predilection. There is hypoattenuation of the periventricular white matter, most commonly indicating chronic ischemic microangiopathy. Hypoattenuation of the site of recent left insular infarct. Vascular: No abnormal hyperdensity of the major intracranial arteries or dural venous sinuses. No  intracranial atherosclerosis. Skull: The visualized skull base, calvarium and extracranial soft tissues are normal. Sinuses/Orbits: No fluid levels or advanced mucosal thickening of the visualized paranasal sinuses. No mastoid or middle ear effusion. The orbits are normal. ASPECTS [facility] Stroke Program Early CT Score) - Ganglionic level infarction (caudate, lentiform nuclei, internal capsule, insula, M1-M3 cortex): 7 - Supraganglionic infarction (M4-M6 cortex): 3 Total score (0-10 with 10 being normal): 10 IMPRESSION: 1. No acute intracranial abnormality. 2. ASPECTS is 10. 3. A recent left insular infarct, as demonstrated on MRI 04/29/2021. These results were called by telephone at the time of interpretation on 05/01/2021 at 8:41 pm to Dr. Erlinda Hong, Who verbally acknowledged these results. Electronically Signed   By: Ulyses Jarred M.D.   On: 05/01/2021 20:42    Procedures Procedures   Medications Ordered in ED Medications  0.9 %  sodium chloride infusion (has no administration in time range)  sodium chloride 0.9 % bolus 1,000 mL (0 mLs Intravenous Stopped 05/01/21 2102)    ED Course  I have reviewed the triage vital signs and the nursing notes.  Pertinent labs & imaging results that were available during my care of the patient were reviewed by me and considered in my medical decision making (see chart for details).    MDM Rules/Calculators/A&P                           CRITICAL CARE Performed by: Valarie Merino   Total critical care time: 45 minutes  Critical care time was exclusive of separately billable procedures and treating other patients.  Critical care was necessary to treat or prevent imminent or life-threatening deterioration.  Critical care was time spent personally by me on the following activities: development of treatment plan with patient and/or surrogate as well as nursing, discussions with consultants, evaluation of patient's response to treatment, examination of patient,  obtaining history from patient or surrogate, ordering and performing treatments and interventions, ordering and review of laboratory studies, ordering and review of radiographic studies, pulse oximetry and re-evaluation of patient's condition.   MDM  MSE complete  Charles Golden was evaluated in Emergency Department on 05/01/2021 for the symptoms described in the history of present illness. He was evaluated in the context of the global COVID-19 pandemic, which necessitated consideration that the patient might be at risk for infection with the SARS-CoV-2 virus that causes COVID-19. Institutional protocols and algorithms that pertain to the evaluation of patients at risk for COVID-19 are in a state of rapid change based on information released by regulatory bodies including the CDC and federal and state organizations. These policies and algorithms were followed during the patient's care in the ED.  Patient is presenting with transient episode of dysarthria and difficulty with speech.  Patient seen by neurology.  Neurology will consult.  They are requesting medicine admission for possible recurrent stroke symptoms.  Patient without indication for acute neuro intervention at this time.  Neurology is requesting overnight observation with possible intervention tomorrow.  Final Clinical Impression(s) / ED Diagnoses Final diagnoses:  Dysarthria    Rx / DC Orders ED Discharge Orders     None        Valarie Merino, MD 05/01/21 2109

## 2021-05-01 NOTE — Plan of Care (Signed)
  RD consulted for education regarding High-Calorie, High-Protein nutrition therapy.  RD provided "High-Calorie High-Protein Nutrition Therapy" handout from the [employer]. Reviewed patient's dietary recall. Provided examples on ways to increase caloric density of foods and beverages frequently consumed by the patient. Also provided ideas to promote variety and to incorporate additional nutrient dense foods into patient's diet. Discussed eating small frequent meals and snacks to assist in increasing overall po intake. Teach back method used.  Expect good compliance.  Pt discharged today.  Roxana Hires, RD, LDN Clinical Dietitian See [facility] for contact information.

## 2021-05-01 NOTE — Consult Note (Signed)
Stroke Neurology Consultation Note  Consult Requested by: Dr. Francia Greaves  Reason for Consult: Aphasia  Consult Date: 05/01/21   The history was obtained from the patient and daughter.  During history and examination, all items were able to obtain unless otherwise noted.  History of Present Illness:  JARAMIE BASTOS is a 87 y.o. Caucasian male with PMH of COPD presented to ED again for slurred speech, difficulty getting words out.  Per patient daughter, patient was just discharged earlier today at 37 PM.  After getting home patient was at baseline, however around 6 PM patient again starting to have difficulty getting words out, and slurred speech.  No weakness or numbness reported.  Daughter sent him to ER again, in triage initially speech much clearer but then it getting worse again.  Code stroke activated.  CT no acute abnormality, left insular small subacute infarct.  BP 104/94.  Received 1 L IV bolus and head of bed flat, recheck BP 130-140s and the patient symptoms again resolved.  Patient was admitted 04/27/2021 for episode of aphasia and difficulty walking for 10 minutes after attending wife's funeral service. CT no acute abnormality but left MCA hyperdense sign.  CTA head and neck showed left ICA occlusion and left M2 occlusion.  MRI showed left small insular cortex infarct but again confirmed left ICA occlusion.  EF 60 to 65%, LDL 72, A1c 6.0.  He was discharged on aspirin 325 and Plavix 75 DAPT for 3 months and then aspirin alone, as well as Lipitor 40.  Recommend BP goal 1 30-1 50.  Patient admitted again on 04/29/2021 for aphasia.  CT head and neck repeat showed prior left ICA occlusion now evolving to left ICA string sign.  Left M2 occlusion also more distal than prior.  Symptoms again resolved during hospitalization, he was discharged with plan to have elective left ICA revascularization next week.  Continued on aspirin and Plavix and Lipitor.  Recommend to avoid low BP.  Per daughter,  patient not eating or drinking well, no appetite since wife's funeral.  They have to force him to drink and eat with some booster drink for the last several days.  LSN: 6 PM tPA Given: No: Recent stroke, and symptoms resolved after IV fluid mRS = 1 IR: no, symptom resolved after IV fluid  Past Medical History:  Diagnosis Date   Bronchitis, chronic (HCC)    COPD (chronic obstructive pulmonary disease) (HCC)    Enlarged prostate    GERD (gastroesophageal reflux disease)    Glaucoma    Glucagonoma    Hernia, incisional    at present   [facility] (hard of hearing)    IBS (irritable bowel syndrome)    Sciatic pain    Sinus congestion     Past Surgical History:  Procedure Laterality Date   BOWEL RESECTION N/A 09/04/2012   Procedure: SMALL BOWEL RESECTION;  Surgeon: Donato Heinz, MD;  Location: [facility];  Service: General;  Laterality: N/A;  Anastimosis   HEMORRHOID SURGERY     HERNIA REPAIR Bilateral 70's   INGUINAL HERNIA REPAIR Left 09/14/2013   Procedure: LEFT INGUINAL HERNIORRHAPHY;  Surgeon: Jamesetta So, MD;  Location: [facility];  Service: General;  Laterality: Left;   INGUINAL HERNIA REPAIR Right 12/23/2018   Procedure: RECURRENT RIGHT INGUINAL HERNIA  REPAIR  WITH MESH;  Surgeon: Aviva Signs, MD;  Location: [facility];  Service: General;  Laterality: Right;   INSERTION OF MESH Left 09/14/2013   Procedure: INSERTION OF MESH;  Surgeon:  Jamesetta So, MD;  Location: [facility];  Service: General;  Laterality: Left;   KNEE ARTHROSCOPY Left 07/21/2014   Procedure: LEFT KNEE ARTHROSCOPY WITH MEDIAL MENISCAL DEBRIDEMENT ;  Surgeon: Gearlean Alf, MD;  Location: [facility];  Service: Orthopedics;  Laterality: Left;   LAPAROTOMY N/A 09/04/2012   Procedure: EXPLORATORY LAPAROTOMY;  Surgeon: Donato Heinz, MD;  Location: [facility];  Service: General;  Laterality: N/A;   SKIN LESION EXCISION     [name]   TONSILLECTOMY      Family History  Problem Relation Age of Onset   Heart disease Mother         Valve replacement and pacemaker   Colon cancer Father    Asthma Other        cousin   Healthy Daughter    Healthy Daughter    Healthy Daughter     Social History:  reports that he quit smoking about 14 years ago. His smoking use included cigarettes. He has a 30.00 pack-year smoking history. He has never used smokeless tobacco. He reports that he does not currently use alcohol. He reports that he does not use drugs.  Allergies:  Allergies  Allergen Reactions   Demerol [Meperidine] Other (See Comments)    Pt. States "he woke up during a colonoscopy"   Spiriva Handihaler [Tiotropium Bromide Monohydrate] Other (See Comments)    Mild Urinary Retention    No current facility-administered medications on file prior to encounter.   Current Outpatient Medications on File Prior to Encounter  Medication Sig Dispense Refill   aspirin EC 325 MG EC tablet Take 1 tablet (325 mg total) by mouth daily. 30 tablet 3   atorvastatin (LIPITOR) 40 MG tablet Take 1 tablet (40 mg total) by mouth daily. 30 tablet 3   azelastine (ASTELIN) 0.1 % nasal spray PLACE 1 SPRAY IN EACH NOSTRIL ONCE A DAY AS DIRECTED (Patient taking differently: Place 2 sprays into both nostrils every evening.) 30 mL 4   BREO ELLIPTA 200-25 MCG/INH AEPB Inhale 1 puff into the lungs daily. 60 each 4   Cholecalciferol (VITAMIN D3) 2000 UNITS TABS Take 1 tablet by mouth daily.     clopidogrel (PLAVIX) 75 MG tablet Take 1 tablet (75 mg total) by mouth daily. 30 tablet 2   Cyanocobalamin (VITAMIN B-12) 5000 MCG SUBL Place 1 tablet under the tongue daily.     finasteride (PROSCAR) 5 MG tablet Take 5 mg by mouth daily.     fluticasone (FLONASE) 50 MCG/ACT nasal spray Place 2 sprays into both nostrils daily.     guaiFENesin (MUCINEX) 600 MG 12 hr tablet Take 600 mg by mouth daily as needed for to loosen phlegm.     latanoprost (XALATAN) 0.005 % ophthalmic solution Place 1 drop into the left eye at bedtime. (Patient taking differently: Place 1  drop into both eyes at bedtime.) 2.5 mL 1   pantoprazole (PROTONIX) 40 MG tablet Take 1 tablet (40 mg total) by mouth daily. 30 tablet 2   tamsulosin (FLOMAX) 0.4 MG CAPS capsule Take 0.8 mg by mouth daily.     timolol (TIMOPTIC) 0.5 % ophthalmic solution Place 1 drop into the left eye every morning.     VENTOLIN HFA 108 (90 Base) MCG/ACT inhaler INHALE 2 PUFFS EVERY 6 HOURS AS NEEDED FOR WHEEZING OR SHORTNESS OF BREATH (Patient taking differently: 1 puff every 6 (six) hours as needed for shortness of breath.) 18 g 0    Review of Systems: A full ROS  was attempted today and was able to be performed.  Systems assessed include - Constitutional, Eyes, HENT, Respiratory, Cardiovascular, Gastrointestinal, Genitourinary, Integument/breast, Hematologic/lymphatic, Musculoskeletal, Neurological, Behavioral/Psych, Endocrine, Allergic/Immunologic - with pertinent responses as per HPI.  Physical Examination: Temp:  [97.5 F (36.4 C)-98 F (36.7 C)] 98 F (36.7 C) (12/05 2037) Pulse Rate:  [65-74] 67 (12/05 2037) Resp:  [11-20] 20 (12/05 2037) BP: (99-134)/(64-94) 134/75 (12/05 2037) SpO2:  [94 %-99 %] 99 % (12/05 2037)  General - well nourished, well developed, in no apparent distress.    Ophthalmologic - fundi not visualized due to noncooperation.    Cardiovascular - regular rhythm and rate  Mental Status -  Level of arousal and orientation to time, place, and person were intact. Language including expression, naming, repetition, comprehension, reading, and writing was assessed and found intact. Fund of Knowledge was assessed and was intact.  Cranial Nerves II - XII - II - Vision intact OU. III, IV, VI - Extraocular movements intact. V - Facial sensation intact bilaterally VII - Facial movement intact bilaterally. VIII - Hearing & vestibular intact bilaterally. X - Palate elevates symmetrically. XI - Chin turning & shoulder shrug intact bilaterally. XII - Tongue protrusion intact.  Motor  Strength - [name] strength was normal in all extremities and pronator drift was absent.   Motor Tone & Bulk - Muscle tone was assessed at the neck and appendages and was normal.  Bulk was normal and fasciculations were absent.   Reflexes - [name] reflexes were normal in all extremities and he had no pathological reflexes.  Sensory - Light touch, temperature/pinprick were assessed and were normal.    Coordination - The patient had normal movements in the hands and feet with no ataxia or dysmetria.  Tremor was absent.  Gait and Station - deferred  NIHSS = 0  Data Reviewed: MR BRAIN WO CONTRAST  Result Date: 04/29/2021 CLINICAL DATA:  Acute neurologic deficit EXAM: MRI HEAD WITHOUT CONTRAST TECHNIQUE: Multiplanar, multiecho pulse sequences of the brain and surrounding structures were obtained without intravenous contrast. COMPARISON:  None. FINDINGS: Brain: There are 2 areas of acute infarction within the left frontal lobe, the larger of which is in the insula. No acute hemorrhage. Normal white matter signal. Generalized volume loss without a clear lobar predilection. Focus of chronic microhemorrhage in the right cerebellum the midline structures are normal. Vascular: Magnetic susceptibility effect within the left MCA likely indicates a site of thrombus. Skull and upper cervical spine: Normal calvarium and skull base. Visualized upper cervical spine and soft tissues are normal. Sinuses/Orbits:No paranasal sinus fluid levels or advanced mucosal thickening. No mastoid or middle ear effusion. Normal orbits. IMPRESSION: 1. Two areas of acute infarction within the left frontal lobe, the larger of which is in the insula. No hemorrhage or mass effect. 2. Magnetic susceptibility effect within the left MCA likely indicates a site of thrombus. Electronically Signed   By: Ulyses Jarred M.D.   On: 04/29/2021 19:16   MR BRAIN WO CONTRAST  Result Date: 04/28/2021 CLINICAL DATA:  Acute neurologic  deficit EXAM: MRI HEAD WITHOUT CONTRAST TECHNIQUE: Multiplanar, multiecho pulse sequences of the brain and surrounding structures were obtained without intravenous contrast. COMPARISON:  None. FINDINGS: Brain: Small focus of abnormal diffusion restriction in the left insula. No other diffusion abnormality. Focus of chronic microhemorrhage in the right cerebellum . Normal white matter signal. Generalized volume loss without a clear lobar predilection. The midline structures are normal. Vascular: Occluded left ICA.  The other flow  voids are normal. Skull and upper cervical spine: Normal calvarium and skull base. Visualized upper cervical spine and soft tissues are normal. Sinuses/Orbits:No paranasal sinus fluid levels or advanced mucosal thickening. No mastoid or middle ear effusion. Normal orbits. IMPRESSION: 1. Small focus of acute ischemia in the left insula. No hemorrhage or mass effect. 2. Occluded left ICA. Electronically Signed   By: Ulyses Jarred M.D.   On: 04/28/2021 01:50   ECHOCARDIOGRAM COMPLETE  Result Date: 04/28/2021    ECHOCARDIOGRAM REPORT   Patient Name:   MAVRIK BYNUM Date of Exam: 04/28/2021 Medical Rec #:  [id number]     Height:       70.0 in Accession #:    [id number]    Weight:       170.0 lb Date of Birth:  Jan 10, 1926     BSA:          1.948 m Patient Age:    6 years      BP:           158/79 mmHg Patient Gender: M             HR:           63 bpm. Exam Location:  Inpatient Procedure: 2D Echo, Color Doppler and Cardiac Doppler Indications:    Stroke i63.9  History:        Patient has no prior history of Echocardiogram examinations.                 COPD.  Sonographer:    Raquel Sarna Senior RDCS Referring Phys: [id number] Candace Gallus MELVIN  Sonographer Comments: Technically difficult due to poor echo windows, repositioned at end of study for wall motion. IMPRESSIONS  1. Technically difficult study with limited views. Left ventricular ejection fraction, by estimation, is 60 to 65%. The left ventricle  has normal function. The left ventricle has no regional wall motion abnormalities. Left ventricular diastolic parameters are consistent with Grade II diastolic dysfunction (pseudonormalization). Elevated left atrial pressure.  2. Right ventricle is poorly visualized but appears grossly normal systolic function and mild to moderately dilated  3. Left atrial size was mildly dilated.  4. The mitral valve is normal in structure. No evidence of mitral valve regurgitation.  5. The aortic valve was not well visualized. Aortic valve regurgitation is not visualized. No aortic stenosis is present. FINDINGS  Left Ventricle: Left ventricular ejection fraction, by estimation, is 60 to 65%. The left ventricle has normal function. The left ventricle has no regional wall motion abnormalities. The left ventricular internal cavity size was normal in size. There is  no left ventricular hypertrophy. Left ventricular diastolic parameters are consistent with Grade II diastolic dysfunction (pseudonormalization). Elevated left atrial pressure. Right Ventricle: The right ventricular size is moderately enlarged. Right vetricular wall thickness was not well visualized. Right ventricular systolic function is normal. Left Atrium: Left atrial size was mildly dilated. Right Atrium: Right atrial size was normal in size. Pericardium: There is no evidence of pericardial effusion. Mitral Valve: The mitral valve is normal in structure. No evidence of mitral valve regurgitation. Tricuspid Valve: The tricuspid valve is normal in structure. Tricuspid valve regurgitation is trivial. Aortic Valve: The aortic valve was not well visualized. Aortic valve regurgitation is not visualized. No aortic stenosis is present. Pulmonic Valve: The pulmonic valve was not well visualized. Pulmonic valve regurgitation is not visualized. Aorta: The aortic root is normal in size and structure. IAS/Shunts: The interatrial septum was not well visualized.  LEFT VENTRICLE  PLAX  2D LVIDd:         4.20 cm   Diastology LVIDs:         2.70 cm   LV e' medial:    4.20 cm/s LV PW:         1.00 cm   LV E/e' medial:  13.5 LV IVS:        0.80 cm   LV e' lateral:   3.90 cm/s LVOT diam:     2.00 cm   LV E/e' lateral: 14.5 LV SV:         54 LV SV Index:   28 LVOT Area:     3.14 cm  RIGHT VENTRICLE RV S prime:     11.10 cm/s TAPSE (M-mode): 2.0 cm LEFT ATRIUM             Index        RIGHT ATRIUM           Index LA diam:        2.70 cm 1.39 cm/m   RA Area:     15.50 cm LA Vol (A2C):   69.9 ml 35.88 ml/m  RA Volume:   39.10 ml  20.07 ml/m LA Vol (A4C):   39.5 ml 20.28 ml/m LA Biplane Vol: 70.0 ml 35.93 ml/m  AORTIC VALVE LVOT Vmax:   66.10 cm/s LVOT Vmean:  49.600 cm/s LVOT VTI:    0.171 m  AORTA Ao Root diam: 3.30 cm MITRAL VALVE               TRICUSPID VALVE MV Area (PHT): 2.29 cm    TR Peak grad:   27.7 mmHg MV Decel Time: 331 msec    TR Vmax:        263.00 cm/s MV E velocity: 56.60 cm/s MV A velocity: 70.20 cm/s  SHUNTS MV E/A ratio:  0.81        Systemic VTI:  0.17 m                            Systemic Diam: 2.00 cm Oswaldo Milian MD Electronically signed by Oswaldo Milian MD Signature Date/Time: 04/28/2021/1:56:04 PM    Final    CT HEAD CODE STROKE WO CONTRAST  Result Date: 05/01/2021 CLINICAL DATA:  Code stroke.  Acute neurologic deficit EXAM: CT HEAD WITHOUT CONTRAST TECHNIQUE: Contiguous axial images were obtained from the base of the skull through the vertex without intravenous contrast. COMPARISON:  Brain MRI 08/06/2020 FINDINGS: Brain: There is no mass, hemorrhage or extra-axial collection. There is generalized atrophy without lobar predilection. There is hypoattenuation of the periventricular white matter, most commonly indicating chronic ischemic microangiopathy. Hypoattenuation of the site of recent left insular infarct. Vascular: No abnormal hyperdensity of the major intracranial arteries or dural venous sinuses. No intracranial atherosclerosis. Skull: The  visualized skull base, calvarium and extracranial soft tissues are normal. Sinuses/Orbits: No fluid levels or advanced mucosal thickening of the visualized paranasal sinuses. No mastoid or middle ear effusion. The orbits are normal. ASPECTS [facility] Stroke Program Early CT Score) - Ganglionic level infarction (caudate, lentiform nuclei, internal capsule, insula, M1-M3 cortex): 7 - Supraganglionic infarction (M4-M6 cortex): 3 Total score (0-10 with 10 being normal): 10 IMPRESSION: 1. No acute intracranial abnormality. 2. ASPECTS is 10. 3. A recent left insular infarct, as demonstrated on MRI 04/29/2021. These results were called by telephone at the time of interpretation on 05/01/2021 at 8:41 pm to Dr. Erlinda Hong, Who verbally  acknowledged these results. Electronically Signed   By: Ulyses Jarred M.D.   On: 05/01/2021 20:42   CT HEAD CODE STROKE WO CONTRAST`  Result Date: 04/29/2021 CLINICAL DATA:  Code stroke. 87 year old male with aphasia. Occluded left internal carotid artery and left MCA M2 branches on CTA 2 days ago. EXAM: CT HEAD WITHOUT CONTRAST TECHNIQUE: Contiguous axial images were obtained from the base of the skull through the vertex without intravenous contrast. COMPARISON:  [facility] CT head, CTA head and neck 04/27/2021. Brain MRI yesterday. FINDINGS: Brain: Small area of cytotoxic edema in the left insula corresponding to abnormal diffusion yesterday. Nearby hyperdense left MCA M2 branch in the sylvian fissure persist. But elsewhere gray-white matter differentiation remains stable since 04/27/2021. No acute intracranial hemorrhage identified. No midline shift, mass effect, or evidence of intracranial mass lesion. No ventriculomegaly. Vascular: Calcified atherosclerosis at the skull base. Persistent hyperdense left MCA M2 branch. Skull: No acute osseous abnormality identified. Sinuses/Orbits: Visualized paranasal sinuses and mastoids are stable and well aerated. Other: No acute orbit or scalp soft  tissue finding. ASPECTS [facility] Stroke Program Early CT Score) Total score (0-10 with 10 being normal): 9 (abnormal left insula as seen by MRI yesterday). IMPRESSION: 1. Expected CT appearance of the small left insular infarct on MRI yesterday. Elsewhere stable gray-white matter differentiation since the CT on 04/27/2021. ASPECTS 9. 2. No intracranial hemorrhage or mass effect. Study discussed by telephone with Dr. Davonna Belling on 04/29/2021 at 09:19 . Electronically Signed   By: Genevie Ann M.D.   On: 04/29/2021 09:20   CT HEAD CODE STROKE WO CONTRAST  Result Date: 04/27/2021 CLINICAL DATA:  Code stroke.  Acute neuro deficit. EXAM: CT HEAD WITHOUT CONTRAST TECHNIQUE: Contiguous axial images were obtained from the base of the skull through the vertex without intravenous contrast. COMPARISON:  MRI head 05/14/2013.  CT head 03/11/2009 FINDINGS: Brain: Generalized atrophy. Negative for hydrocephalus. Negative for acute infarct, hemorrhage, mass. Vascular: Hyperdensity of the left middle cerebral artery in the sylvian fissure. Probable left M2 branch occlusion. Correlate with symptoms. Skull: Negative Sinuses/Orbits: Mild mucosal edema right maxillary sinus. Bilateral cataract extraction Other: None ASPECTS ([place] Stroke Program Early CT Score) - Ganglionic level infarction (caudate, lentiform nuclei, internal capsule, insula, M1-M3 cortex): 7 - Supraganglionic infarction (M4-M6 cortex): 3 Total score (0-10 with 10 being normal): 10 IMPRESSION: 1. Negative for acute infarct or hemorrhage 2. Hyperdense left MCA branch in the sylvian fissure likely due to acute thrombosis. Correlate with symptoms. 3. ASPECTS is 10 4. Code stroke imaging results were communicated on 04/27/2021 at 6:39 pm to provider Lindzen via text page amion Electronically Signed   By: Franchot Gallo M.D.   On: 04/27/2021 18:46   CT ANGIO HEAD NECK W WO CM (CODE STROKE)  Result Date: 04/27/2021 CLINICAL DATA:  Acute neuro deficit. Speech  abnormality which has cleared. EXAM: CT ANGIOGRAPHY HEAD AND NECK TECHNIQUE: Multidetector CT imaging of the head and neck was performed using the standard protocol during bolus administration of intravenous contrast. Multiplanar CT image reconstructions and MIPs were obtained to evaluate the vascular anatomy. Carotid stenosis measurements (when applicable) are obtained utilizing NASCET criteria, using the distal internal carotid diameter as the denominator. CONTRAST:  20mL OMNIPAQUE IOHEXOL 350 MG/ML SOLN COMPARISON:  CT head 04/27/2021 FINDINGS: CTA NECK FINDINGS Aortic arch: Atherosclerotic aortic arch and proximal great vessels. Proximal great vessels are patent. Right carotid system: Atherosclerotic calcification right carotid bifurcation without significant stenosis Left carotid system: Atherosclerotic disease at the left  carotid bifurcation. Occlusion of the proximal left internal carotid artery. Left external carotid artery patent. Vertebral arteries: Both vertebral arteries are patent to the skull base with mild stenosis proximally on the right. Skeleton: Cervical spondylosis.  No acute skeletal abnormality. Other neck: Negative for mass or adenopathy Upper chest: Lung apices clear bilaterally. Review of the MIP images confirms the above findings CTA HEAD FINDINGS Anterior circulation: Right cavernous carotid widely patent. Right anterior and right middle cerebral arteries widely patent. Left internal carotid artery is occluded with reconstitution of the supraclinoid segment. Left anterior cerebral artery patent. Left M1 segment patent. Occlusion of the proximal left M2 branch inferior division which appears acute. Posterior circulation: Both vertebral arteries patent to the basilar. PICA patent bilaterally. Basilar widely patent. Superior cerebellar and posterior cerebral arteries patent bilaterally. Venous sinuses: Normal venous enhancement Anatomic variants: None Review of the MIP images confirms the  above findings IMPRESSION: 1. Occlusion of the proximal left internal carotid artery of indeterminate age. However, this is likely acute given the acute occlusion of the inferior division of the left MCA M2 branch. This was hyperdense on CT consistent with acute thrombus. 2. No significant right carotid stenosis. No significant vertebral stenosis. 3. These results were called by telephone at the time of interpretation on 04/27/2021 at 7:24 pm to provider [facility] , who verbally acknowledged these results. Electronically Signed   By: Franchot Gallo M.D.   On: 04/27/2021 19:29   CT ANGIO HEAD NECK W WO CM W PERF (CODE STROKE)  Result Date: 04/29/2021 CLINICAL DATA:  Neuro deficit, acute, stroke suspected EXAM: CT ANGIOGRAPHY HEAD AND NECK CT PERFUSION BRAIN TECHNIQUE: Multidetector CT imaging of the head and neck was performed using the standard protocol during bolus administration of intravenous contrast. Multiplanar CT image reconstructions and MIPs were obtained to evaluate the vascular anatomy. Carotid stenosis measurements (when applicable) are obtained utilizing NASCET criteria, using the distal internal carotid diameter as the denominator. Multiphase CT imaging of the brain was performed following IV bolus contrast injection. Subsequent parametric perfusion maps were calculated using RAPID software. CONTRAST:  129mL OMNIPAQUE IOHEXOL 350 MG/ML SOLN COMPARISON:  Recent CTA and MRI brain FINDINGS: CTA NECK Aortic arch: Calcified plaque along the arch and patent great vessel origins. Right carotid system: Patent. Stable appearance. No hemodynamically significant stenosis at the ICA origin. Left carotid system: Patent common carotid. There has been some recanalization of the internal carotid artery. Persistent mixed plaque including irregular noncalcified plaque along the proximal ICA with superimposed thrombus. There is focal near occlusion with submillimeter residual lumen. Vessel caliber remains small  reflecting flow limitation. Vertebral arteries: Patent.  Stable appearance. Skeleton: Stable advanced cervical spine degenerative changes. Other neck: No new abnormality. Upper chest: Emphysema.  No new abnormality. Review of the MIP images confirms the above findings CTA HEAD Anterior circulation: As in the neck, there is no flow present within the left ICA. Left M1 MCA is patent. Persistent left M2 MCA occlusion although now slightly more distal. There is some reconstitution at the M2-M3 junction as before. Right middle and both anterior cerebral arteries are patent. Posterior circulation: Intracranial vertebral arteries are patent. Basilar artery is patent. Major cerebellar artery origins are patent. Posterior cerebral arteries are patent. Venous sinuses: Patent as allowed by contrast bolus timing. Review of the MIP images confirms the above findings CT Brain Perfusion Findings: CBF (<30%) Volume: 27mL Perfusion (Tmax>6.0s) volume: 106mL Mismatch Volume: 50mL Infarction Location: Left MCA territory IMPRESSION: Interval partial recanalization of the cervical  left ICA. Remains focal near occlusion with submillimeter lumen and decreased distal vessel caliber reflecting flow limitation. Intracranially, the left ICA is now patent. There is a persistent left M2 MCA occlusion, but the occlusion now occurs more distally within the sylvian fissure. Some distal reconstitution is present. Perfusion imaging demonstrates no core infarction and 83 mL of penumbra in the left MCA territory. Electronically Signed   By: Macy Mis M.D.   On: 04/29/2021 10:01    Assessment: 87 y.o. male with PMH of COPD presented to ED again for slurred speech, difficulty getting words out. This is his 3rd ED visit and he was just discharged this am. Plan for elective left ICA revascularization next week with Dr. Norma Fredrickson. Today LSW at 6pm and then started again aphasia wax and wean. Found to have low BP in ER 104/94.  Received 1 L IV bolus  and head of bed flat, recheck BP 130-140s and the patient symptoms again resolved. CT no acute finding but subacute left insular infarct. Discussed with Dr. Norma Fredrickson, will keep patient n.p.o. and IV fluid and plan for left ICA revascularization tomorrow.  Continue DAPT with aspirin and Plavix as well as statin.  Close neuro monitoring.  Stroke Risk Factors -  left ICA high grade stenosis  Plan: Frequent neuro checks Telemetry monitoring N.p.o. for now and plan for left ICA revascularization tomorrow. Continue IV fluid, avoid low BP BP goal 130-160 before revascularization GI and DVT prophylaxis  Continue aspirin Plavix and statin Discussed with Dr. Francia Greaves ED physician Will follow  Thank you for this consultation and allowing [place] to participate in the care of this patient.  Rosalin Hawking, MD PhD Stroke Neurology 05/01/2021 10:48 PM

## 2021-05-01 NOTE — ED Notes (Signed)
Pt back to room from CT. Pt was reported dysarthric on admission, but has resolved at this time.

## 2021-05-02 ENCOUNTER — Encounter (HOSPITAL_COMMUNITY)
Admission: EM | Disposition: A | Discharge status: Home Health | Payer: Self-pay | Source: Home / Self Care | Attending: Internal Medicine

## 2021-05-02 ENCOUNTER — Telehealth: Payer: Self-pay

## 2021-05-02 ENCOUNTER — Observation Stay (HOSPITAL_COMMUNITY): Payer: Medicare Other | Admitting: Certified Registered"

## 2021-05-02 ENCOUNTER — Other Ambulatory Visit (HOSPITAL_COMMUNITY): Payer: Self-pay

## 2021-05-02 DIAGNOSIS — K581 Irritable bowel syndrome with constipation: Secondary | ICD-10-CM | POA: Diagnosis present

## 2021-05-02 DIAGNOSIS — Z885 Allergy status to narcotic agent status: Secondary | ICD-10-CM | POA: Diagnosis not present

## 2021-05-02 DIAGNOSIS — Z6823 Body mass index (BMI) 23.0-23.9, adult: Secondary | ICD-10-CM | POA: Diagnosis not present

## 2021-05-02 DIAGNOSIS — Z87891 Personal history of nicotine dependence: Secondary | ICD-10-CM | POA: Diagnosis not present

## 2021-05-02 DIAGNOSIS — I63512 Cerebral infarction due to unspecified occlusion or stenosis of left middle cerebral artery: Secondary | ICD-10-CM | POA: Diagnosis not present

## 2021-05-02 DIAGNOSIS — I959 Hypotension, unspecified: Secondary | ICD-10-CM | POA: Diagnosis present

## 2021-05-02 DIAGNOSIS — I63032 Cerebral infarction due to thrombosis of left carotid artery: Secondary | ICD-10-CM | POA: Diagnosis present

## 2021-05-02 DIAGNOSIS — Z20822 Contact with and (suspected) exposure to covid-19: Secondary | ICD-10-CM | POA: Diagnosis present

## 2021-05-02 DIAGNOSIS — I6522 Occlusion and stenosis of left carotid artery: Secondary | ICD-10-CM | POA: Diagnosis not present

## 2021-05-02 DIAGNOSIS — I129 Hypertensive chronic kidney disease with stage 1 through stage 4 chronic kidney disease, or unspecified chronic kidney disease: Secondary | ICD-10-CM | POA: Diagnosis present

## 2021-05-02 DIAGNOSIS — I6300 Cerebral infarction due to thrombosis of unspecified precerebral artery: Secondary | ICD-10-CM | POA: Diagnosis not present

## 2021-05-02 DIAGNOSIS — I6609 Occlusion and stenosis of unspecified middle cerebral artery: Secondary | ICD-10-CM | POA: Diagnosis present

## 2021-05-02 DIAGNOSIS — E43 Unspecified severe protein-calorie malnutrition: Secondary | ICD-10-CM

## 2021-05-02 DIAGNOSIS — J449 Chronic obstructive pulmonary disease, unspecified: Secondary | ICD-10-CM | POA: Diagnosis present

## 2021-05-02 DIAGNOSIS — Z888 Allergy status to other drugs, medicaments and biological substances status: Secondary | ICD-10-CM | POA: Diagnosis not present

## 2021-05-02 DIAGNOSIS — R4701 Aphasia: Secondary | ICD-10-CM | POA: Diagnosis present

## 2021-05-02 DIAGNOSIS — R471 Dysarthria and anarthria: Secondary | ICD-10-CM | POA: Diagnosis present

## 2021-05-02 DIAGNOSIS — R338 Other retention of urine: Secondary | ICD-10-CM | POA: Diagnosis present

## 2021-05-02 DIAGNOSIS — H409 Unspecified glaucoma: Secondary | ICD-10-CM | POA: Diagnosis present

## 2021-05-02 DIAGNOSIS — R351 Nocturia: Secondary | ICD-10-CM | POA: Diagnosis not present

## 2021-05-02 DIAGNOSIS — N401 Enlarged prostate with lower urinary tract symptoms: Secondary | ICD-10-CM | POA: Diagnosis present

## 2021-05-02 DIAGNOSIS — I639 Cerebral infarction, unspecified: Secondary | ICD-10-CM | POA: Diagnosis present

## 2021-05-02 DIAGNOSIS — K219 Gastro-esophageal reflux disease without esophagitis: Secondary | ICD-10-CM | POA: Diagnosis present

## 2021-05-02 DIAGNOSIS — H9190 Unspecified hearing loss, unspecified ear: Secondary | ICD-10-CM | POA: Diagnosis present

## 2021-05-02 DIAGNOSIS — E1122 Type 2 diabetes mellitus with diabetic chronic kidney disease: Secondary | ICD-10-CM | POA: Diagnosis present

## 2021-05-02 DIAGNOSIS — N1832 Chronic kidney disease, stage 3b: Secondary | ICD-10-CM | POA: Diagnosis present

## 2021-05-02 DIAGNOSIS — E871 Hypo-osmolality and hyponatremia: Secondary | ICD-10-CM | POA: Diagnosis present

## 2021-05-02 DIAGNOSIS — G2581 Restless legs syndrome: Secondary | ICD-10-CM | POA: Diagnosis present

## 2021-05-02 DIAGNOSIS — R29700 NIHSS score 0: Secondary | ICD-10-CM | POA: Diagnosis present

## 2021-05-02 LAB — BASIC METABOLIC PANEL
Anion gap: 8 (ref 5–15)
BUN: 19 mg/dL (ref 8–23)
CO2: 19 mmol/L — ABNORMAL LOW (ref 22–32)
Calcium: 8.2 mg/dL — ABNORMAL LOW (ref 8.9–10.3)
Chloride: 107 mmol/L (ref 98–111)
Creatinine, Ser: 1.54 mg/dL — ABNORMAL HIGH (ref 0.61–1.24)
GFR, Estimated: 42 mL/min — ABNORMAL LOW (ref >60)
Glucose, Bld: 101 mg/dL — ABNORMAL HIGH (ref 70–99)
Potassium: 3.9 mmol/L (ref 3.5–5.1)
Sodium: 134 mmol/L — ABNORMAL LOW (ref 135–145)

## 2021-05-02 LAB — CBC
HCT: 39.2 % (ref 39.0–52.0)
Hemoglobin: 13 g/dL (ref 13.0–17.0)
MCH: 31.4 pg (ref 26.0–34.0)
MCHC: 33.2 g/dL (ref 30.0–36.0)
MCV: 94.7 fL (ref 80.0–100.0)
Platelets: 215 10*3/uL (ref 150–400)
RBC: 4.14 MIL/uL — ABNORMAL LOW (ref 4.22–5.81)
RDW: 12.8 % (ref 11.5–15.5)
WBC: 7.8 10*3/uL (ref 4.0–10.5)
nRBC: 0 % (ref 0.0–0.2)

## 2021-05-02 LAB — RAPID URINE DRUG SCREEN, HOSP PERFORMED
Amphetamines: NOT DETECTED
Barbiturates: NOT DETECTED
Benzodiazepines: NOT DETECTED
Cocaine: NOT DETECTED
Opiates: NOT DETECTED
Tetrahydrocannabinol: NOT DETECTED

## 2021-05-02 LAB — URINALYSIS, ROUTINE W REFLEX MICROSCOPIC
Bilirubin Urine: NEGATIVE
Glucose, UA: NEGATIVE mg/dL
Hgb urine dipstick: NEGATIVE
Ketones, ur: NEGATIVE mg/dL
Leukocytes,Ua: NEGATIVE
Nitrite: NEGATIVE
Protein, ur: NEGATIVE mg/dL
Specific Gravity, Urine: <1.005 — ABNORMAL LOW (ref 1.005–1.030)
pH: 6 (ref 5.0–8.0)

## 2021-05-02 LAB — MRSA NEXT GEN BY PCR, NASAL: MRSA by PCR Next Gen: NOT DETECTED

## 2021-05-02 SURGERY — IR WITH ANESTHESIA
Anesthesia: General

## 2021-05-02 MED ORDER — MIDODRINE HCL 5 MG PO TABS
2.5000 mg | ORAL_TABLET | Freq: Two times a day (BID) | ORAL | Status: DC
  Administered 2021-05-02: 2.5 mg via ORAL
  Filled 2021-05-02: qty 1

## 2021-05-02 MED ORDER — CHLORHEXIDINE GLUCONATE CLOTH 2 % EX PADS
6.0000 | MEDICATED_PAD | Freq: Every day | CUTANEOUS | Status: DC
  Administered 2021-05-02 – 2021-05-05 (×4): 6 via TOPICAL

## 2021-05-02 NOTE — Telephone Encounter (Signed)
Transition Care Management Unsuccessful Follow-up Telephone Call  Date of discharge and from where:  05-01-21 [facility]  Attempts:  1st Attempt  Reason for unsuccessful TCM follow-up call:  Left voice message with daughter Chrys Racer

## 2021-05-02 NOTE — H&P (Signed)
Chief Complaint: Patient was seen in consultation today for left ICA stenosis  Referring Physician(s): Egbert Garibaldi, MD  Supervising Physician: Pedro Earls  Patient Status: [facility] - In-pt  History of Present Illness: Charles Golden is a 87 y.o. male with a past medical history  significant for GERD, COPD, CKD III who presented to [facility] ED on 12/1 with complaints of word finding difficulties/unsteady gait. He was found to have a left ICA occlusion and no intervention was recommended as his deficits had resolved. He was discharged on 12/2 with instructions to begin Plavix and ASA, unfortunately he began to experience similar symptoms and returned to the ED on 12/3. He was again admitted and due to the presence of thrombus in the left ICA and resolution of symptoms decision was made to postpone NIR intervention until Plavix and ASA could take effect. Unfortunately he began to experience slurred speech again about 6 hours later and he was brought back to the ED. Code stroke was activated and no acute abnormality was seen on imaging, he was found to be hypotensive for which he was given a fluid bolus and this improved his slurred speech. He has been admitted and plan is to proceed with NIR procedure as soon as possible.  Patient seen in neuro ICU, his daughters are at bedside - he denies any slurred speech so far this afternoon and feels relatively well overall, he is asking if he can have a hot cup of coffee and something to eat. We discussed procedure planned for 12/7 at 12:30 and they are all in agreement to proceed.   Past Medical History:  Diagnosis Date   Bronchitis, chronic (HCC)    COPD (chronic obstructive pulmonary disease) (HCC)    Enlarged prostate    GERD (gastroesophageal reflux disease)    Glaucoma    Glucagonoma    Hernia, incisional    at present   [facility] (hard of hearing)    IBS (irritable bowel syndrome)    Sciatic pain    Sinus congestion     Past  Surgical History:  Procedure Laterality Date   BOWEL RESECTION N/A 09/04/2012   Procedure: SMALL BOWEL RESECTION;  Surgeon: Donato Heinz, MD;  Location: [facility];  Service: General;  Laterality: N/A;  Anastimosis   HEMORRHOID SURGERY     HERNIA REPAIR Bilateral 70's   INGUINAL HERNIA REPAIR Left 09/14/2013   Procedure: LEFT INGUINAL HERNIORRHAPHY;  Surgeon: Jamesetta So, MD;  Location: [facility];  Service: General;  Laterality: Left;   INGUINAL HERNIA REPAIR Right 12/23/2018   Procedure: RECURRENT RIGHT INGUINAL HERNIA  REPAIR  WITH MESH;  Surgeon: Aviva Signs, MD;  Location: [facility];  Service: General;  Laterality: Right;   INSERTION OF MESH Left 09/14/2013   Procedure: INSERTION OF MESH;  Surgeon: Jamesetta So, MD;  Location: [facility];  Service: General;  Laterality: Left;   KNEE ARTHROSCOPY Left 07/21/2014   Procedure: LEFT KNEE ARTHROSCOPY WITH MEDIAL MENISCAL DEBRIDEMENT ;  Surgeon: Gearlean Alf, MD;  Location: [facility];  Service: Orthopedics;  Laterality: Left;   LAPAROTOMY N/A 09/04/2012   Procedure: EXPLORATORY LAPAROTOMY;  Surgeon: Donato Heinz, MD;  Location: [facility];  Service: General;  Laterality: N/A;   SKIN LESION EXCISION     [name]   TONSILLECTOMY      Allergies: Demerol [meperidine] and Spiriva handihaler [tiotropium bromide monohydrate]  Medications: Prior to Admission medications   Medication Sig Start Date End Date Taking? Authorizing Provider  aspirin EC 325 MG EC tablet Take 1 tablet (325 mg total) by mouth daily. 04/29/21   Ghimire, Henreitta Leber, MD  atorvastatin (LIPITOR) 40 MG tablet Take 1 tablet (40 mg total) by mouth daily. 04/29/21   Ghimire, Henreitta Leber, MD  azelastine (ASTELIN) 0.1 % nasal spray PLACE 1 SPRAY IN EACH NOSTRIL ONCE A DAY AS DIRECTED 01/17/21   Gottschalk, Ashly M, DO  BREO ELLIPTA 200-25 MCG/INH AEPB Inhale 1 puff into the lungs daily. 01/17/21   [name], DO  Cholecalciferol (VITAMIN D3) 2000 UNITS TABS Take 1 tablet by mouth daily.     [provider]  clopidogrel (PLAVIX) 75 MG tablet Take 1 tablet (75 mg total) by mouth daily. 04/29/21   Ghimire, Henreitta Leber, MD  Cyanocobalamin (VITAMIN B-12) 5000 MCG SUBL Place 1 tablet under the tongue daily.    [provider]  finasteride (PROSCAR) 5 MG tablet Take 5 mg by mouth daily.    [provider]  fluticasone (FLONASE) 50 MCG/ACT nasal spray Place 2 sprays into both nostrils daily.    [provider]  guaiFENesin (MUCINEX) 600 MG 12 hr tablet Take 600 mg by mouth daily as needed for to loosen phlegm.    [provider]  latanoprost (XALATAN) 0.005 % ophthalmic solution Place 1 drop into the left eye at bedtime. Patient taking differently: Place 1 drop into both eyes at bedtime. 06/01/16   Chipper Herb, MD  pantoprazole (PROTONIX) 40 MG tablet Take 1 tablet (40 mg total) by mouth daily. 04/28/21 04/28/22  Ghimire, Henreitta Leber, MD  tamsulosin (FLOMAX) 0.4 MG CAPS capsule Take 0.8 mg by mouth daily. 02/11/19   [provider]  timolol (TIMOPTIC) 0.5 % ophthalmic solution Place 1 drop into the left eye every morning. 11/24/18   [provider]  VENTOLIN HFA 108 (90 Base) MCG/ACT inhaler INHALE 2 PUFFS EVERY 6 HOURS AS NEEDED FOR WHEEZING OR SHORTNESS OF BREATH Patient taking differently: 1 puff every 6 (six) hours as needed for shortness of breath. 02/28/21   [name], DO     Family History  Problem Relation Age of Onset   Heart disease Mother        Valve replacement and pacemaker   Colon cancer Father    Asthma Other        cousin   Healthy Daughter    Healthy Daughter    Healthy Daughter     Social History   Socioeconomic History   Marital status: Married    Spouse name: [name]   Number of children: 3   Years of education: 12+   Highest education level: Bachelor's degree (e.g., BA, AB, BS)  Occupational History   Occupation: Retired    Comment: Immunologist  Tobacco Use   Smoking status: Former     Packs/day: 1.00    Years: 30.00    Pack years: 30.00    Types: Cigarettes    Quit date: 05/28/2006    Years since quitting: 14.9   Smokeless tobacco: Never  Vaping Use   Vaping Use: Never used  Substance and Sexual Activity   Alcohol use: Not Currently    Alcohol/week: 0.0 standard drinks   Drug use: No   Sexual activity: Yes    Birth control/protection: None  Other Topics Concern   Not on file  Social History Narrative   Patient lives at home with [name].    Patient has 3 children.    Patient has his  BS   Patient is retired.    Drinks about 2 cups of coffee per day.      [facility]:   He is still married. 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 He is from [place].       Social Determinants of Health   Financial Resource Strain: Not on file  Food Insecurity: Not on file  Transportation Needs: Not on file  Physical Activity: Not on file  Stress: Not on file  Social Connections: Not on file     Review of Systems: A 12 point ROS discussed and pertinent positives are indicated in the HPI above.  All other systems are negative.  Review of Systems  Constitutional:  Negative for chills and fever.  Respiratory:  Negative for cough and shortness of breath.   Cardiovascular:  Negative for chest pain.  Gastrointestinal:  Negative for abdominal pain.  Musculoskeletal:  Positive for back pain (due to bed being uncomfortable).  Neurological:  Negative for dizziness, facial asymmetry, speech difficulty, weakness, numbness and headaches.  Psychiatric/Behavioral:  Negative for confusion.    Vital Signs: BP 102/68   Pulse 63   Temp (!) 97.2 F (36.2 C) (Oral)   Resp 12   SpO2 97%   Physical Exam Vitals and nursing note reviewed.  Constitutional:      General: He is not in acute distress. HENT:     Head: Normocephalic.  Cardiovascular:     Rate and Rhythm: Normal rate and  regular rhythm.  Pulmonary:     Effort: Pulmonary effort is normal.     Breath sounds: Normal breath sounds.  Abdominal:     Palpations: Abdomen is soft.  Skin:    General: Skin is warm and dry.  Neurological:     Mental Status: He is alert and oriented to person, place, and time.     Motor: No weakness.     MD Evaluation Airway: WNL Heart: WNL Abdomen: WNL Chest/ Lungs: WNL Mallampati/Airway Score:  (Per anesthesia)   Imaging: MR BRAIN WO CONTRAST  Result Date: 04/29/2021 CLINICAL DATA:  Acute neurologic deficit EXAM: MRI HEAD WITHOUT CONTRAST TECHNIQUE: Multiplanar, multiecho pulse sequences of the brain and surrounding structures were obtained without intravenous contrast. COMPARISON:  None. FINDINGS: Brain: There are 2 areas of acute infarction within the left frontal lobe, the larger of which is in the insula. No acute hemorrhage. Normal white matter signal. Generalized volume loss without a clear lobar predilection. Focus of chronic microhemorrhage in the right cerebellum the midline structures are normal. Vascular: Magnetic susceptibility effect within the left MCA likely indicates a site of thrombus. Skull and upper cervical spine: Normal calvarium and skull base. Visualized upper cervical spine and soft tissues are normal. Sinuses/Orbits:No paranasal sinus fluid levels or advanced mucosal thickening. No mastoid or middle ear effusion. Normal orbits. IMPRESSION: 1. Two areas of acute infarction within the left frontal lobe, the larger of which is in the insula. No hemorrhage or mass effect. 2. Magnetic susceptibility effect within the left MCA likely indicates a site of thrombus. Electronically Signed   By: Ulyses Jarred M.D.   On: 04/29/2021 19:16   MR BRAIN WO CONTRAST  Result Date: 04/28/2021 CLINICAL DATA:  Acute neurologic deficit EXAM: MRI HEAD WITHOUT CONTRAST TECHNIQUE: Multiplanar, multiecho pulse sequences of the brain and surrounding structures were obtained without  intravenous contrast. COMPARISON:  None. FINDINGS: Brain: Small focus of abnormal diffusion restriction in the left  insula. No other diffusion abnormality. Focus of chronic microhemorrhage in the right cerebellum . Normal white matter signal. Generalized volume loss without a clear lobar predilection. The midline structures are normal. Vascular: Occluded left ICA.  The other flow voids are normal. Skull and upper cervical spine: Normal calvarium and skull base. Visualized upper cervical spine and soft tissues are normal. Sinuses/Orbits:No paranasal sinus fluid levels or advanced mucosal thickening. No mastoid or middle ear effusion. Normal orbits. IMPRESSION: 1. Small focus of acute ischemia in the left insula. No hemorrhage or mass effect. 2. Occluded left ICA. Electronically Signed   By: Ulyses Jarred M.D.   On: 04/28/2021 01:50   ECHOCARDIOGRAM COMPLETE  Result Date: 04/28/2021    ECHOCARDIOGRAM REPORT   Patient Name:   SANG BLOUNT Date of Exam: 04/28/2021 Medical Rec #:  [id number]     Height:       70.0 in Accession #:    [id number]    Weight:       170.0 lb Date of Birth:  1925/10/04     BSA:          1.948 m Patient Age:    81 years      BP:           158/79 mmHg Patient Gender: M             HR:           63 bpm. Exam Location:  Inpatient Procedure: 2D Echo, Color Doppler and Cardiac Doppler Indications:    Stroke i63.9  History:        Patient has no prior history of Echocardiogram examinations.                 COPD.  Sonographer:    Raquel Sarna Senior RDCS Referring Phys: [id number] Candace Gallus MELVIN  Sonographer Comments: Technically difficult due to poor echo windows, repositioned at end of study for wall motion. IMPRESSIONS  1. Technically difficult study with limited views. Left ventricular ejection fraction, by estimation, is 60 to 65%. The left ventricle has normal function. The left ventricle has no regional wall motion abnormalities. Left ventricular diastolic parameters are consistent with Grade II  diastolic dysfunction (pseudonormalization). Elevated left atrial pressure.  2. Right ventricle is poorly visualized but appears grossly normal systolic function and mild to moderately dilated  3. Left atrial size was mildly dilated.  4. The mitral valve is normal in structure. No evidence of mitral valve regurgitation.  5. The aortic valve was not well visualized. Aortic valve regurgitation is not visualized. No aortic stenosis is present. FINDINGS  Left Ventricle: Left ventricular ejection fraction, by estimation, is 60 to 65%. The left ventricle has normal function. The left ventricle has no regional wall motion abnormalities. The left ventricular internal cavity size was normal in size. There is  no left ventricular hypertrophy. Left ventricular diastolic parameters are consistent with Grade II diastolic dysfunction (pseudonormalization). Elevated left atrial pressure. Right Ventricle: The right ventricular size is moderately enlarged. Right vetricular wall thickness was not well visualized. Right ventricular systolic function is normal. Left Atrium: Left atrial size was mildly dilated. Right Atrium: Right atrial size was normal in size. Pericardium: There is no evidence of pericardial effusion. Mitral Valve: The mitral valve is normal in structure. No evidence of mitral valve regurgitation. Tricuspid Valve: The tricuspid valve is normal in structure. Tricuspid valve regurgitation is trivial. Aortic Valve: The aortic valve was not well visualized. Aortic valve regurgitation is not visualized. No aortic  stenosis is present. Pulmonic Valve: The pulmonic valve was not well visualized. Pulmonic valve regurgitation is not visualized. Aorta: The aortic root is normal in size and structure. IAS/Shunts: The interatrial septum was not well visualized.  LEFT VENTRICLE PLAX 2D LVIDd:         4.20 cm   Diastology LVIDs:         2.70 cm   LV e' medial:    4.20 cm/s LV PW:         1.00 cm   LV E/e' medial:  13.5 LV IVS:         0.80 cm   LV e' lateral:   3.90 cm/s LVOT diam:     2.00 cm   LV E/e' lateral: 14.5 LV SV:         54 LV SV Index:   28 LVOT Area:     3.14 cm  RIGHT VENTRICLE RV S prime:     11.10 cm/s TAPSE (M-mode): 2.0 cm LEFT ATRIUM             Index        RIGHT ATRIUM           Index LA diam:        2.70 cm 1.39 cm/m   RA Area:     15.50 cm LA Vol (A2C):   69.9 ml 35.88 ml/m  RA Volume:   39.10 ml  20.07 ml/m LA Vol (A4C):   39.5 ml 20.28 ml/m LA Biplane Vol: 70.0 ml 35.93 ml/m  AORTIC VALVE LVOT Vmax:   66.10 cm/s LVOT Vmean:  49.600 cm/s LVOT VTI:    0.171 m  AORTA Ao Root diam: 3.30 cm MITRAL VALVE               TRICUSPID VALVE MV Area (PHT): 2.29 cm    TR Peak grad:   27.7 mmHg MV Decel Time: 331 msec    TR Vmax:        263.00 cm/s MV E velocity: 56.60 cm/s MV A velocity: 70.20 cm/s  SHUNTS MV E/A ratio:  0.81        Systemic VTI:  0.17 m                            Systemic Diam: 2.00 cm Oswaldo Milian MD Electronically signed by Oswaldo Milian MD Signature Date/Time: 04/28/2021/1:56:04 PM    Final    CT HEAD CODE STROKE WO CONTRAST  Result Date: 05/01/2021 CLINICAL DATA:  Code stroke.  Acute neurologic deficit EXAM: CT HEAD WITHOUT CONTRAST TECHNIQUE: Contiguous axial images were obtained from the base of the skull through the vertex without intravenous contrast. COMPARISON:  Brain MRI 08/06/2020 FINDINGS: Brain: There is no mass, hemorrhage or extra-axial collection. There is generalized atrophy without lobar predilection. There is hypoattenuation of the periventricular white matter, most commonly indicating chronic ischemic microangiopathy. Hypoattenuation of the site of recent left insular infarct. Vascular: No abnormal hyperdensity of the major intracranial arteries or dural venous sinuses. No intracranial atherosclerosis. Skull: The visualized skull base, calvarium and extracranial soft tissues are normal. Sinuses/Orbits: No fluid levels or advanced mucosal thickening of the visualized  paranasal sinuses. No mastoid or middle ear effusion. The orbits are normal. ASPECTS [facility] Stroke Program Early CT Score) - Ganglionic level infarction (caudate, lentiform nuclei, internal capsule, insula, M1-M3 cortex): 7 - Supraganglionic infarction (M4-M6 cortex): 3 Total score (0-10 with 10 being normal): 10 IMPRESSION: 1. No  acute intracranial abnormality. 2. ASPECTS is 10. 3. A recent left insular infarct, as demonstrated on MRI 04/29/2021. These results were called by telephone at the time of interpretation on 05/01/2021 at 8:41 pm to Dr. Erlinda Hong, Who verbally acknowledged these results. Electronically Signed   By: Ulyses Jarred M.D.   On: 05/01/2021 20:42   CT HEAD CODE STROKE WO CONTRAST`  Result Date: 04/29/2021 CLINICAL DATA:  Code stroke. 87 year old male with aphasia. Occluded left internal carotid artery and left MCA M2 branches on CTA 2 days ago. EXAM: CT HEAD WITHOUT CONTRAST TECHNIQUE: Contiguous axial images were obtained from the base of the skull through the vertex without intravenous contrast. COMPARISON:  [facility] CT head, CTA head and neck 04/27/2021. Brain MRI yesterday. FINDINGS: Brain: Small area of cytotoxic edema in the left insula corresponding to abnormal diffusion yesterday. Nearby hyperdense left MCA M2 branch in the sylvian fissure persist. But elsewhere gray-white matter differentiation remains stable since 04/27/2021. No acute intracranial hemorrhage identified. No midline shift, mass effect, or evidence of intracranial mass lesion. No ventriculomegaly. Vascular: Calcified atherosclerosis at the skull base. Persistent hyperdense left MCA M2 branch. Skull: No acute osseous abnormality identified. Sinuses/Orbits: Visualized paranasal sinuses and mastoids are stable and well aerated. Other: No acute orbit or scalp soft tissue finding. ASPECTS [facility] Stroke Program Early CT Score) Total score (0-10 with 10 being normal): 9 (abnormal left insula as seen by MRI yesterday).  IMPRESSION: 1. Expected CT appearance of the small left insular infarct on MRI yesterday. Elsewhere stable gray-white matter differentiation since the CT on 04/27/2021. ASPECTS 9. 2. No intracranial hemorrhage or mass effect. Study discussed by telephone with Dr. Davonna Belling on 04/29/2021 at 09:19 . Electronically Signed   By: Genevie Ann M.D.   On: 04/29/2021 09:20   CT HEAD CODE STROKE WO CONTRAST  Result Date: 04/27/2021 CLINICAL DATA:  Code stroke.  Acute neuro deficit. EXAM: CT HEAD WITHOUT CONTRAST TECHNIQUE: Contiguous axial images were obtained from the base of the skull through the vertex without intravenous contrast. COMPARISON:  MRI head 05/14/2013.  CT head 03/11/2009 FINDINGS: Brain: Generalized atrophy. Negative for hydrocephalus. Negative for acute infarct, hemorrhage, mass. Vascular: Hyperdensity of the left middle cerebral artery in the sylvian fissure. Probable left M2 branch occlusion. Correlate with symptoms. Skull: Negative Sinuses/Orbits: Mild mucosal edema right maxillary sinus. Bilateral cataract extraction Other: None ASPECTS ([place] Stroke Program Early CT Score) - Ganglionic level infarction (caudate, lentiform nuclei, internal capsule, insula, M1-M3 cortex): 7 - Supraganglionic infarction (M4-M6 cortex): 3 Total score (0-10 with 10 being normal): 10 IMPRESSION: 1. Negative for acute infarct or hemorrhage 2. Hyperdense left MCA branch in the sylvian fissure likely due to acute thrombosis. Correlate with symptoms. 3. ASPECTS is 10 4. Code stroke imaging results were communicated on 04/27/2021 at 6:39 pm to provider Lindzen via text page amion Electronically Signed   By: Franchot Gallo M.D.   On: 04/27/2021 18:46   CT ANGIO HEAD NECK W WO CM (CODE STROKE)  Result Date: 04/27/2021 CLINICAL DATA:  Acute neuro deficit. Speech abnormality which has cleared. EXAM: CT ANGIOGRAPHY HEAD AND NECK TECHNIQUE: Multidetector CT imaging of the head and neck was performed using the standard  protocol during bolus administration of intravenous contrast. Multiplanar CT image reconstructions and MIPs were obtained to evaluate the vascular anatomy. Carotid stenosis measurements (when applicable) are obtained utilizing NASCET criteria, using the distal internal carotid diameter as the denominator. CONTRAST:  45mL OMNIPAQUE IOHEXOL 350 MG/ML SOLN COMPARISON:  CT  head 04/27/2021 FINDINGS: CTA NECK FINDINGS Aortic arch: Atherosclerotic aortic arch and proximal great vessels. Proximal great vessels are patent. Right carotid system: Atherosclerotic calcification right carotid bifurcation without significant stenosis Left carotid system: Atherosclerotic disease at the left carotid bifurcation. Occlusion of the proximal left internal carotid artery. Left external carotid artery patent. Vertebral arteries: Both vertebral arteries are patent to the skull base with mild stenosis proximally on the right. Skeleton: Cervical spondylosis.  No acute skeletal abnormality. Other neck: Negative for mass or adenopathy Upper chest: Lung apices clear bilaterally. Review of the MIP images confirms the above findings CTA HEAD FINDINGS Anterior circulation: Right cavernous carotid widely patent. Right anterior and right middle cerebral arteries widely patent. Left internal carotid artery is occluded with reconstitution of the supraclinoid segment. Left anterior cerebral artery patent. Left M1 segment patent. Occlusion of the proximal left M2 branch inferior division which appears acute. Posterior circulation: Both vertebral arteries patent to the basilar. PICA patent bilaterally. Basilar widely patent. Superior cerebellar and posterior cerebral arteries patent bilaterally. Venous sinuses: Normal venous enhancement Anatomic variants: None Review of the MIP images confirms the above findings IMPRESSION: 1. Occlusion of the proximal left internal carotid artery of indeterminate age. However, this is likely acute given the acute  occlusion of the inferior division of the left MCA M2 branch. This was hyperdense on CT consistent with acute thrombus. 2. No significant right carotid stenosis. No significant vertebral stenosis. 3. These results were called by telephone at the time of interpretation on 04/27/2021 at 7:24 pm to provider [facility] , who verbally acknowledged these results. Electronically Signed   By: Franchot Gallo M.D.   On: 04/27/2021 19:29   CT ANGIO HEAD NECK W WO CM W PERF (CODE STROKE)  Result Date: 04/29/2021 CLINICAL DATA:  Neuro deficit, acute, stroke suspected EXAM: CT ANGIOGRAPHY HEAD AND NECK CT PERFUSION BRAIN TECHNIQUE: Multidetector CT imaging of the head and neck was performed using the standard protocol during bolus administration of intravenous contrast. Multiplanar CT image reconstructions and MIPs were obtained to evaluate the vascular anatomy. Carotid stenosis measurements (when applicable) are obtained utilizing NASCET criteria, using the distal internal carotid diameter as the denominator. Multiphase CT imaging of the brain was performed following IV bolus contrast injection. Subsequent parametric perfusion maps were calculated using RAPID software. CONTRAST:  188mL OMNIPAQUE IOHEXOL 350 MG/ML SOLN COMPARISON:  Recent CTA and MRI brain FINDINGS: CTA NECK Aortic arch: Calcified plaque along the arch and patent great vessel origins. Right carotid system: Patent. Stable appearance. No hemodynamically significant stenosis at the ICA origin. Left carotid system: Patent common carotid. There has been some recanalization of the internal carotid artery. Persistent mixed plaque including irregular noncalcified plaque along the proximal ICA with superimposed thrombus. There is focal near occlusion with submillimeter residual lumen. Vessel caliber remains small reflecting flow limitation. Vertebral arteries: Patent.  Stable appearance. Skeleton: Stable advanced cervical spine degenerative changes. Other neck: No  new abnormality. Upper chest: Emphysema.  No new abnormality. Review of the MIP images confirms the above findings CTA HEAD Anterior circulation: As in the neck, there is no flow present within the left ICA. Left M1 MCA is patent. Persistent left M2 MCA occlusion although now slightly more distal. There is some reconstitution at the M2-M3 junction as before. Right middle and both anterior cerebral arteries are patent. Posterior circulation: Intracranial vertebral arteries are patent. Basilar artery is patent. Major cerebellar artery origins are patent. Posterior cerebral arteries are patent. Venous sinuses: Patent as allowed by  contrast bolus timing. Review of the MIP images confirms the above findings CT Brain Perfusion Findings: CBF (<30%) Volume: 20mL Perfusion (Tmax>6.0s) volume: 87mL Mismatch Volume: 75mL Infarction Location: Left MCA territory IMPRESSION: Interval partial recanalization of the cervical left ICA. Remains focal near occlusion with submillimeter lumen and decreased distal vessel caliber reflecting flow limitation. Intracranially, the left ICA is now patent. There is a persistent left M2 MCA occlusion, but the occlusion now occurs more distally within the sylvian fissure. Some distal reconstitution is present. Perfusion imaging demonstrates no core infarction and 83 mL of penumbra in the left MCA territory. Electronically Signed   By: Macy Mis M.D.   On: 04/29/2021 10:01    Labs:  CBC: Recent Labs    04/27/21 1826 04/27/21 1846 04/29/21 0902 05/01/21 2018 05/01/21 2039 05/02/21 0204  WBC 9.6  --  7.7 8.5  --  7.8  HGB 14.4   < > 14.4 14.5 14.6 13.0  HCT 44.1   < > 44.4 43.6 43.0 39.2  PLT 280  --  256 255  --  215   < > = values in this interval not displayed.    COAGS: Recent Labs    04/27/21 1826 04/29/21 0902 05/01/21 2018  INR 1.1 1.1 1.1  APTT 28 27 32    BMP: Recent Labs    06/10/20 1032 12/13/20 1429 04/27/21 1826 04/27/21 1846 04/29/21 0902  05/01/21 2018 05/01/21 2039 05/02/21 0204  NA 145*   < > 138   < > 138 131* 133* 134*  K 5.2   < > 4.6   < > 4.5 4.4 4.4 3.9  CL 108*   < > 107   < > 106 101 100 107  CO2 22   < > 24  --  24 21*  --  19*  GLUCOSE 85   < > 94   < > 103* 109* 106* 101*  BUN 20   < > 23   < > 26* 21 24* 19  CALCIUM 9.5   < > 8.8*  --  8.7* 8.9  --  8.2*  CREATININE 1.55*   < > 1.66*   < > 1.58* 1.71* 1.80* 1.54*  GFRNONAA 38*  --  38*  --  41* 37*  --  42*  GFRAA 44*  --   --   --   --   --   --   --    < > = values in this interval not displayed.    LIVER FUNCTION TESTS: Recent Labs    02/01/21 1227 04/27/21 1826 04/29/21 0902 05/01/21 2018  BILITOT 0.5 0.7 0.9 0.8  AST 18 23 25 25   ALT 12 12 14 14   ALKPHOS 88 67 75 80  PROT 6.8 6.0* 6.4* 6.1*  ALBUMIN 3.9 3.5 3.6 3.3*    TUMOR MARKERS: No results for input(s): AFPTM, CEA, CA199, CHROMGRNA in the last 8760 hours.  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 Given his recurrent symptoms plan to proceed with left ICA angioplasty/stent placement 12/7 at 12:30. Diet started this afternoon, patient to be NPO at midnight.  Risks and benefits of left ICA angioplasty/stent  placement arteriogram with intervention were discussed with the patient including, but not limited to bleeding, infection, vascular injury, contrast induced renal failure, stroke, reperfusion hemorrhage, or even death.  This interventional procedure involves the use of X-rays and because of the nature of the planned procedure, it is possible that we will have prolonged use of X-ray fluoroscopy. Potential radiation risks to you include (but are  not limited to) the following: - A slightly elevated risk for cancer  several years later in life. This risk is typically less than 0.5% percent. This risk is low in comparison to the normal incidence of human cancer, which is 33% for women and 50% for men according to the [employer]. - Radiation induced injury can include skin redness, resembling a rash, tissue breakdown / ulcers and hair loss (which can be temporary or permanent).  The likelihood of either of these occurring depends on the difficulty of the procedure and whether you are sensitive to radiation due to previous procedures, disease, or genetic conditions.  IF your procedure requires a prolonged use of radiation, you will be notified and given written instructions for further action.  It is your responsibility to monitor the irradiated area for the 2 weeks following the procedure and to notify your physician if you are concerned that you have suffered a radiation induced injury.    All of the patient's questions were answered, patient is agreeable to proceed.  Consent signed and in chart.  Thank you for this interesting consult.  I greatly enjoyed meeting LOUISE VICTORY and look forward to participating in their care.  A copy of this report was sent to the requesting provider on this date.  Electronically Signed: Joaquim Nam, PA-C 05/02/2021, 10:45 AM   I spent a total of 40 Minutes in face to face in clinical consultation, greater than 50% of which was counseling/coordinating care for left ICA angioplasty/stent placement.

## 2021-05-02 NOTE — Progress Notes (Signed)
Progress Note    Charles Golden   IWP:[id number]  DOB: [birth year]  DOA: 05/01/2021     0 PCP: [name], DO  Initial CC: aphasia  Hospital Course: Charles Golden is a 87 y.o. male with medical history significant of hearing loss, BPH, bronchiectasis, COPD, GERD, IBS, RLS, CKD presenting after continued intermittent episodes of aphasia and dysarthria.  Patient initially presented on 12/1 for acute stroke secondary to left ICA and M2 occlusion. Patient had stroke work-up at that time and also was readmitted multiple times since. Symptoms had continued to return intermittently, thus he was brought back to the ER.   He was again admitted for further evaluation.  Tentative plan is for inpatient stenting of the left ICA.  Interval History:  Resting in bed comfortably this morning, daughter present bedside.  Aphasia seems to be improved when seen this morning.  Assessment & Plan:  CVA Left ICA stenosis > Patient with recurrent admissions for intermittent stroke symptoms of aphasia and dysarthria. > Initially presented on 12/1 found to have occluded left ICA and M2.  With left insular infarct.  Plan was for outpatient neuro interventional radiology with stenting.  Had recurrent symptoms - evaluated by neuro and IR; tentative plan is now inpatient Left ICA stenting on 12/7 - continue neuro checks - continue asa, plavix, statin    COPD - Continue home Breo and as needed albuterol  BPH - Continue home finasteride   GERD - Protonix  DMII - continue diet control - A1c 5.8% on 04/30/21  Old records reviewed in assessment of this patient  Antimicrobials:   DVT prophylaxis: Lovenox  Code Status:   Code Status: Full Code  Disposition Plan:   Status is: Inpt  Objective: Blood pressure (!) 143/86, pulse 66, temperature 98 F (36.7 C), temperature source Oral, resp. rate 18, height 5\' 11"  (1.803 m), weight 75.8 kg, SpO2 97 %.  Examination:  Physical  Exam Constitutional:      General: He is not in acute distress.    Appearance: Normal appearance.  HENT:     Head: Normocephalic and atraumatic.     Mouth/Throat:     Mouth: Mucous membranes are moist.  Eyes:     Extraocular Movements: Extraocular movements intact.  Cardiovascular:     Rate and Rhythm: Normal rate and regular rhythm.     Heart sounds: Normal heart sounds.  Pulmonary:     Effort: Pulmonary effort is normal. No respiratory distress.     Breath sounds: Normal breath sounds. No wheezing.  Abdominal:     General: Bowel sounds are normal. There is no distension.     Palpations: Abdomen is soft.     Tenderness: There is no abdominal tenderness.  Musculoskeletal:        General: Normal range of motion.     Cervical back: Normal range of motion and neck supple.  Skin:    General: Skin is warm and dry.  Neurological:     General: No focal deficit present.     Mental Status: He is alert.     Comments: Reported intermittent aphasia  Psychiatric:        Mood and Affect: Mood normal.        Behavior: Behavior normal.     Consultants:  Neuro IR  Procedures:    Data Reviewed: I have personally reviewed labs and imaging studies     LOS: 0 days  Time spent: Greater than 50% of the 35 minute visit  was spent in counseling/coordination of care for the patient as laid out in the A&P.   Dwyane Dee, MD [facility] 05/02/2021, 4:02 PM

## 2021-05-02 NOTE — Progress Notes (Addendum)
STROKE TEAM PROGRESS NOTE   INTERVAL HISTORY Patient is seen in his room with multiple family members at the bedside.  He was discharged yesterday at 1200 but began to experience aphasia at 1800 and was brought back to the ED.  His BP was noted to be low, and he was given bolus IV fluids.  Aphasia then resolved. His vital signs are stable, and his neurological exam is unchanged. Patient will undergo stenting of left ICA today or tomorrow.  He had repeat CT head last night which showed no acute abnormality Vitals:   05/02/21 0900 05/02/21 1032 05/02/21 1147 05/02/21 1200  BP: 102/68  (!) 124/101 119/72  Pulse: 63  71 67  Resp: 12  20 17   Temp:  (!) 97.2 F (36.2 C) 98 F (36.7 C) 98 F (36.7 C)  TempSrc:  Oral Oral Oral  SpO2: 97%  97% 96%  Weight:   75.8 kg   Height:   5\' 11"  (1.803 m)    CBC:  Recent Labs  Lab 04/29/21 0902 05/01/21 2018 05/01/21 2039 05/02/21 0204  WBC 7.7 8.5  --  7.8  NEUTROABS 5.3 6.0  --   --   HGB 14.4 14.5 14.6 13.0  HCT 44.4 43.6 43.0 39.2  MCV 96.7 93.6  --  94.7  PLT 256 255  --  001    Basic Metabolic Panel:  Recent Labs  Lab 05/01/21 2018 05/01/21 2039 05/02/21 0204  NA 131* 133* 134*  K 4.4 4.4 3.9  CL 101 100 107  CO2 21*  --  19*  GLUCOSE 109* 106* 101*  BUN 21 24* 19  CREATININE 1.71* 1.80* 1.54*  CALCIUM 8.9  --  8.2*    Lipid Panel:  Recent Labs  Lab 04/30/21 0126  CHOL 105  TRIG 64  HDL 42  CHOLHDL 2.5  VLDL 13  LDLCALC 50    HgbA1c:  Recent Labs  Lab 04/30/21 0126  HGBA1C 5.8*    Urine Drug Screen:  Recent Labs  Lab 05/02/21 0055  LABOPIA NONE DETECTED  COCAINSCRNUR NONE DETECTED  LABBENZ NONE DETECTED  AMPHETMU NONE DETECTED  THCU NONE DETECTED  LABBARB NONE DETECTED     Alcohol Level  Recent Labs  Lab 05/01/21 2018  ETH <10     IMAGING past 24 hours CT HEAD CODE STROKE WO CONTRAST  Result Date: 05/01/2021 CLINICAL DATA:  Code stroke.  Acute neurologic deficit EXAM: CT HEAD WITHOUT  CONTRAST TECHNIQUE: Contiguous axial images were obtained from the base of the skull through the vertex without intravenous contrast. COMPARISON:  Brain MRI 08/06/2020 FINDINGS: Brain: There is no mass, hemorrhage or extra-axial collection. There is generalized atrophy without lobar predilection. There is hypoattenuation of the periventricular white matter, most commonly indicating chronic ischemic microangiopathy. Hypoattenuation of the site of recent left insular infarct. Vascular: No abnormal hyperdensity of the major intracranial arteries or dural venous sinuses. No intracranial atherosclerosis. Skull: The visualized skull base, calvarium and extracranial soft tissues are normal. Sinuses/Orbits: No fluid levels or advanced mucosal thickening of the visualized paranasal sinuses. No mastoid or middle ear effusion. The orbits are normal. ASPECTS [facility] Stroke Program Early CT Score) - Ganglionic level infarction (caudate, lentiform nuclei, internal capsule, insula, M1-M3 cortex): 7 - Supraganglionic infarction (M4-M6 cortex): 3 Total score (0-10 with 10 being normal): 10 IMPRESSION: 1. No acute intracranial abnormality. 2. ASPECTS is 10. 3. A recent left insular infarct, as demonstrated on MRI 04/29/2021. These results were called by telephone at the time  of interpretation on 05/01/2021 at 8:41 pm to Dr. Erlinda Hong, Who verbally acknowledged these results. Electronically Signed   By: Ulyses Jarred M.D.   On: 05/01/2021 20:42    PHYSICAL EXAM General:  Patient is pleasant elderly Caucasian male alert, well-developed male in no acute distress  NEURO:  Mental Status: AA&Ox3  Speech/Language: speech is without dysarthria or aphasia.  Repetition, fluency, and comprehension intact.  Cranial Nerves:  II: PERRL. Visual fields full.  III, IV, VI: EOMI. Eyelids elevate symmetrically.  V: Sensation is intact to light touch and symmetrical to face.  VII: Smile is symmetrical.  VIII: hearing intact to voice. IX, X:   Phonation is normal.  XII: tongue is midline without fasciculations. Motor: 5/5 strength to all muscle groups tested.  Sensation- Intact to light touch bilaterally. Extinction absent to light touch to DSS.  Coordination: FTN intact bilaterally Gait- deferred   ASSESSMENT/PLAN Charles Golden is a 87 y.o. male with history of  history of BPH, COP, CKD stage IIIb, and recent stroke on 04/27/2021 who was readmitted earlier this week for repeat symptoms of aphasia.  He was discharged yesterday at 1200 and returned to the ED at 1800 with another episode of aphasia which has since resolved.  He has been readmitted and will undergo stenting of the left ICA.  Midodrine may be used to prevent hypotension which has led to episodes of aphasia.  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 ASPECTS 10. CTA head & neck Interval partial recanalization of the cervical left ICA. Remains focal near occlusion with submillimeter lumen and decreased distal vessel caliber reflecting flow limitation. Intracranially, the left ICA is now patent. There is a persistent left M2 MCA occlusion, but the occlusion now occurs more distally within the sylvian fissure. Some distal reconstitution is present.  Perfusion imaging demonstrates no core infarction and 83 mL of penumbra in the left MCA territory. MRI  Two areas of acute infarction within the left frontal lobe, the larger of which is in the insula. Magnetic susceptibility effect within the left MCA likely indicates a site of thrombus. 2D Echo Normal left ventricular function, EF 60-65% LDL 50 HgbA1c 5.8 VTE prophylaxis - lovenox 40mg     Diet   Diet NPO time specified Except for: Sips with Meds   aspirin 325 mg daily and clopidogrel 75 mg daily  prior to admission, now on aspirin 325 mg daily and clopidogrel 75 mg daily. Continue prior medications Therapy recommendations: No follow-up necessary disposition:    home    Hypertension Stable Avoid low BP, long term goal 130-150 given ICA occlusion May add midodrine to prevent further episodes of hypotension  Hyperlipidemia Home meds:  Atorvastatin 40mg , resumed in hospital LDL 50, goal < 70 Continue statin at discharge  Diabetes type II Controlled HgbA1c 5.8, goal < 7.0 CBGs No results for input(s): GLUCAP in the last 72 hours.   SSI  Other Stroke Risk Factors Advanced Age >/= 33  Hx stroke/TIA Migraines- patient does report using Excedrin migraine for headaches   Other Active Problems COPD Continue home bronchodilator BPH Resume home medication- flomax CKD IIIb- at baseline function Monitor renal function  Hospital day # Charles Golden , MSN, AGACNP-BC [facility] See Amion for schedule and pager information  05/02/2021 1:00 PM   STROKE MD NOTE : I have personally obtained history,examined this patient, reviewed notes, independently viewed imaging studies, participated in medical decision making and plan of care.ROS completed by me personally and pertinent positives fully documented  I have made any additions or clarifications directly to the above note. Agree with note above.  This is the patient's third admission in the last 1 week with fluctuating symptoms of aphasia in the setting of acute carotid occlusion with some partial recannulization patient is being considered for elective revascularization.  Recommend admit to ICU and close neurological monitoring with strict blood pressure control with permissive hypertension with systolic goal [phone].  Start midodrine and continue IV fluids.  Discussed with Dr. Norma Fredrickson patient likely to undergo elective carotid revascularization with stenting pending availability of anesthesia.  Long discussion with  patient and daughters and answered questions. This patient is critically ill and at significant risk of neurological worsening, death and care requires constant monitoring of vital signs, hemodynamics,respiratory and cardiac monitoring, extensive review of multiple databases, frequent neurological assessment, discussion with family, other specialists and medical decision making of high complexity.I have made any additions or clarifications directly to the above note.This critical care time does not reflect procedure time, or teaching time or supervisory time of PA/NP/Med Resident etc but could involve care discussion time.  I spent 30 minutes of neurocritical care time  in the care of  this patient.     Antony Contras, MD Medical Director [facility] Pager: [phone] 05/02/2021 1:54 PM  To contact Stroke Continuity provider, please refer to [email or web address] After hours, contact General Neurology

## 2021-05-02 NOTE — ED Notes (Signed)
Daughter Riaan Toledo [phone] would like an update

## 2021-05-02 NOTE — Progress Notes (Signed)
OT Cancellation Note  Patient Details Name: Charles Golden MRN: [id number] DOB: 09-09-1925   Cancelled Treatment:    Reason Eval/Treat Not Completed: Active bedrest order.  Will reattempt.  Nilsa Nutting., OTR/L Acute Rehabilitation Services Pager [phone] Office [phone]   Lucille Passy M 05/02/2021, 12:16 PM

## 2021-05-03 ENCOUNTER — Other Ambulatory Visit (HOSPITAL_COMMUNITY): Payer: Medicare Other

## 2021-05-03 ENCOUNTER — Encounter (HOSPITAL_COMMUNITY)
Admission: EM | Disposition: A | Discharge status: Home Health | Payer: Self-pay | Source: Home / Self Care | Attending: Internal Medicine

## 2021-05-03 LAB — CBC
HCT: 38.8 % — ABNORMAL LOW (ref 39.0–52.0)
Hemoglobin: 13.3 g/dL (ref 13.0–17.0)
MCH: 31.8 pg (ref 26.0–34.0)
MCHC: 34.3 g/dL (ref 30.0–36.0)
MCV: 92.8 fL (ref 80.0–100.0)
Platelets: 236 10*3/uL (ref 150–400)
RBC: 4.18 MIL/uL — ABNORMAL LOW (ref 4.22–5.81)
RDW: 12.9 % (ref 11.5–15.5)
WBC: 8.5 10*3/uL (ref 4.0–10.5)
nRBC: 0 % (ref 0.0–0.2)

## 2021-05-03 LAB — BASIC METABOLIC PANEL
Anion gap: 9 (ref 5–15)
BUN: 17 mg/dL (ref 8–23)
CO2: 23 mmol/L (ref 22–32)
Calcium: 8.3 mg/dL — ABNORMAL LOW (ref 8.9–10.3)
Chloride: 103 mmol/L (ref 98–111)
Creatinine, Ser: 1.55 mg/dL — ABNORMAL HIGH (ref 0.61–1.24)
GFR, Estimated: 41 mL/min — ABNORMAL LOW (ref >60)
Glucose, Bld: 92 mg/dL (ref 70–99)
Potassium: 4.4 mmol/L (ref 3.5–5.1)
Sodium: 135 mmol/L (ref 135–145)

## 2021-05-03 LAB — RESP PANEL BY RT-PCR (FLU A&B, COVID) ARPGX2
Influenza A by PCR: NEGATIVE
Influenza B by PCR: NEGATIVE
SARS Coronavirus 2 by RT PCR: NEGATIVE

## 2021-05-03 LAB — MAGNESIUM: Magnesium: 1.8 mg/dL (ref 1.7–2.4)

## 2021-05-03 SURGERY — IR WITH ANESTHESIA
Anesthesia: General | Laterality: Left

## 2021-05-03 MED ORDER — POLYETHYLENE GLYCOL 3350 17 G PO PACK: 17.0000 g | PACK | Freq: Every day | ORAL | Status: DC | PRN

## 2021-05-03 MED ORDER — MIDODRINE HCL 5 MG PO TABS
5.0000 mg | ORAL_TABLET | Freq: Two times a day (BID) | ORAL | Status: DC
  Administered 2021-05-03 – 2021-05-04 (×3): 5 mg via ORAL
  Filled 2021-05-03 (×3): qty 1

## 2021-05-03 MED ORDER — SENNOSIDES-DOCUSATE SODIUM 8.6-50 MG PO TABS
1.0000 | ORAL_TABLET | Freq: Two times a day (BID) | ORAL | Status: DC
  Administered 2021-05-03 – 2021-05-06 (×4): 1 via ORAL
  Filled 2021-05-03 (×4): qty 1

## 2021-05-03 NOTE — Progress Notes (Signed)
PT Cancellation Note  Patient Details Name: Charles Golden MRN: [id number] DOB: February 22, 1926   Cancelled Treatment:    Reason Eval/Treat Not Completed: Patient at procedure or test/unavailable;Active bedrest order - plan for procedure today, PT to check back.  Stacie Glaze, PT DPT Acute Rehabilitation Services Pager [phone]  Office [phone]    Louis Matte 05/03/2021, 8:48 AM

## 2021-05-03 NOTE — Progress Notes (Addendum)
  Progress Note    Charles Golden   TKW:[id number]  DOB: [birth year]  DOA: 05/01/2021     1 PCP: [name], DO  Initial CC: aphasia  Hospital Course: Charles Golden is a 87 y.o. male with medical history significant of hearing loss, BPH, bronchiectasis, COPD, GERD, IBS, RLS, CKD presenting after continued intermittent episodes of aphasia and dysarthria. Patient initially presented on 12/1 for acute stroke secondary to left ICA and M2 occlusion. Patient had stroke work-up at that time and also was readmitted multiple times since. Symptoms had continued to return intermittently, thus he was brought back to the ER.  -IR consulted,  plan is for inpatient stenting of the left ICA.  Interval History:  -Feels okay, no new complaints, without recurrent aphasia or dysarthria  Assessment & Plan:  Recurrent left CVA Left ICA stenosis > Patient with recurrent admissions for intermittent stroke symptoms of aphasia and dysarthria. > Initially presented on 12/1 found to have occluded left ICA and M2.  With left insular infarct.  Plan was for outpatient neuro interventional radiology with stenting.  Had recurrent symptoms - evaluated by neuro and IR; tentative plan Left ICA stenting today - continue neuro checks - continue asa, plavix, statin  -continue IVF, midodrine to Keep BP>120   COPD -Stable - Continue home Breo and as needed albuterol  BPH - Continue home finasteride   GERD - Protonix  DMII - continue diet control - A1c 5.8% on 04/30/21  Chronic constipation -And Senokot, MiraLAX PRN  DVT prophylaxis: Lovenox  Code Status:   Code Status: Full Code Family: d/w daughter at bedside  Disposition Plan:   Status is: Inpt  Objective: Blood pressure 129/78, pulse 60, temperature 98.2 F (36.8 C), temperature source Oral, resp. rate 10, height 5\' 11"  (1.803 m), weight 75.8 kg, SpO2 95 %.   Examination:  Gen: Awake, Alert, Oriented X 3, hard of hearing HEENT: no JVD,  left carotid bruit, left carotid bruit Lungs: Good air movement bilaterally, CTAB CVS: S1S2/RRR Abd: soft, Non tender, non distended, BS present Extremities: No edema Neuro: Moves all extremities, no localizing signs Skin: no new rashes on exposed skin   Consultants:  Neuro IR  Procedures:    Data Reviewed: I have personally reviewed labs and imaging studies     LOS: 1 day  Time spent: 61min  Domenic Polite, MD [facility] 05/03/2021, 12:04 PM

## 2021-05-03 NOTE — Progress Notes (Addendum)
STROKE TEAM PROGRESS NOTE   INTERVAL HISTORY Patient is resting comfortably in bed.  Daughters are at the bedside.  Plan for inpatient stenting of the left ICA.  Later today no new episodes of aphasia or dysarthria.  Patient states he is feeling good.  Plans for evaluation with speech therapy, physical therapy, Occupational Therapy after procedure.  Patient is hemodynamically stable.  Laboratory values are unremarkable.   Vitals:   05/03/21 0300 05/03/21 0400 05/03/21 0500 05/03/21 0600  BP: 118/72 124/68 (!) 156/119 125/74  Pulse: 64 65 69 66  Resp:  15 19 (!) 22  Temp:  98.3 F (36.8 C)    TempSrc:  Oral    SpO2: 98% 97% 93% 97%  Weight:      Height:       CBC:  Recent Labs  Lab 04/29/21 0902 05/01/21 2018 05/01/21 2039 05/02/21 0204 05/03/21 0346  WBC 7.7 8.5  --  7.8 8.5  NEUTROABS 5.3 6.0  --   --   --   HGB 14.4 14.5   < > 13.0 13.3  HCT 44.4 43.6   < > 39.2 38.8*  MCV 96.7 93.6  --  94.7 92.8  PLT 256 255  --  215 236   < > = values in this interval not displayed.   Basic Metabolic Panel:  Recent Labs  Lab 05/02/21 0204 05/03/21 0346  NA 134* 135  K 3.9 4.4  CL 107 103  CO2 19* 23  GLUCOSE 101* 92  BUN 19 17  CREATININE 1.54* 1.55*  CALCIUM 8.2* 8.3*  MG  --  1.8   Lipid Panel:  Recent Labs  Lab 04/30/21 0126  CHOL 105  TRIG 64  HDL 42  CHOLHDL 2.5  VLDL 13  LDLCALC 50   HgbA1c:  Recent Labs  Lab 04/30/21 0126  HGBA1C 5.8*   Urine Drug Screen:  Recent Labs  Lab 05/02/21 0055  LABOPIA NONE DETECTED  COCAINSCRNUR NONE DETECTED  LABBENZ NONE DETECTED  AMPHETMU NONE DETECTED  THCU NONE DETECTED  LABBARB NONE DETECTED    Alcohol Level  Recent Labs  Lab 05/01/21 2018  ETH <10    IMAGING past 24 hours No results found.  PHYSICAL EXAM General:  Patient is pleasant elderly Caucasian male alert, well-developed male in no acute distress  NEURO:  Mental Status: AA&Ox3  Speech/Language: speech is without dysarthria or aphasia.   Repetition, fluency, and comprehension intact.  Cranial Nerves:  II: PERRL. Visual fields full.  III, IV, VI: EOMI. Eyelids elevate symmetrically.  V: Sensation is intact to light touch and symmetrical to face.  VII: Smile is symmetrical.  VIII: hearing intact to voice. IX, X:  Phonation is normal.  XII: tongue is midline without fasciculations. Motor: 5/5 strength to all muscle groups tested.  Sensation- Intact to light touch bilaterally. Extinction absent to light touch to DSS.  Coordination: FTN intact bilaterally Gait- deferred   ASSESSMENT/PLAN Charles Golden is a 87 y.o. male with history of  history of BPH, COP, CKD stage IIIb, and recent stroke on 04/27/2021 who was readmitted earlier this week for repeat symptoms of aphasia.  He was discharged yesterday at 1200 and returned to the ED at 1800 with another episode of aphasia which has since resolved.  He has been readmitted and will undergo stenting of the left ICA.  Midodrine may be used to prevent hypotension which has led to episodes of aphasia.  Stroke:  evolution/enlargement of previous left insular cortex infarct  due to acute occlusion of the inferior division of the left M2 and likely chronic occlusion of left ICA due to thrombosis source Code Stroke CT head No acute abnormality, recent left insular infarct. ASPECTS 10. CTA head & neck Interval - partial recanalization of the cervical left ICA. Remains focal near occlusion with submillimeter lumen and decreased distal vessel caliber reflecting flow limitation. Intracranially, the left ICA is now patent. There is a persistent left M2 MCA occlusion, but the occlusion now occurs more distally within the sylvian fissure. Some distal reconstitution is present.  Perfusion imaging demonstrates no core infarction and 83 mL of penumbra in the left MCA territory. MRI  Two areas of acute infarction within the left frontal lobe, the larger of which is in the insula. Magnetic susceptibility  effect within the left MCA likely indicates a site of thrombus. 2D Echo Normal left ventricular function, EF 60-65% LDL 50 HgbA1c 5.8 VTE prophylaxis - lovenox 40mg     Diet   Diet NPO time specified Except for: Sips with Meds   aspirin 325 mg daily and clopidogrel 75 mg daily prior to admission, now on aspirin 325 mg daily and clopidogrel 75 mg daily. Continue prior medications Therapy recommendations: No follow-up necessary disposition:    home    Hypertension History / Hypotension Stable Avoid low BP, long term goal 130-150 given ICA occlusion May add midodrine to prevent further episodes of hypotension  Hyperlipidemia Home meds:  Atorvastatin 40mg , resumed in hospital LDL 50, goal < 70 Continue statin at discharge        Diabetes type II Controlled HgbA1c 5.8, goal < 7.0 CBGs - No results for input(s): GLUCAP in the last 72 hours. SSI        Other Stroke Risk Factors Advanced Age >/= 38  Hx stroke/TIA Migraines- patient does report using Excedrin migraine for headaches       Other Active Problems COPD - Continue home bronchodilator  BPH -> Proscar - (may contribute to hypotension)  CKD IIIb - at baseline function - Monitor renal function - Creatinine - 1.54->1.55 (Normal saline at 75 cc / hour) Hypotension -> midodrine Left carotid stenosis - by CTA neck 04/29/21 - Interval partial recanalization of the cervical left ICA. Remains focal near occlusion with submillimeter lumen and decreased distal vessel caliber reflecting flow limitation. Intracranially, the left ICA is now patent. There is a persistent left M2 MCA occlusion, but the occlusion now occurs more distally within the sylvian fissure. Some distal reconstitution is present. (ASA and Plavix currently) Dr Debbrah Alar - consult 05/01/21 -> left ICA angioplasty/stent placement planned 12/7.   Hospital day # 1   Patient seen and examined by NP/APP with MD. MD to update note as needed.   Janine Ores, DNP, FNP-BC [facility] Pager: [phone]   I have personally obtained history,examined this patient, reviewed notes, independently viewed imaging studies, participated in medical decision making and plan of care.ROS completed by me personally and pertinent positives fully documented  I have made any additions or clarifications directly to the above note. Agree with note above.  Patient selected left carotid stenting unfortunately was postponed to later today.  Long discussion patient and family at the bedside and answered questions.  Greater than 50% time during this 25-minute visit was spent in counseling and coordination of care about symptomatic carotid stenosis and discussion about carotid stenting and stroke prevention and answering questions with  Charles Contras, MD Medical Director [facility] Pager: [phone] 05/03/2021 2:28  PM    To contact Stroke Continuity provider, please refer to [email or web address] After hours, contact General Neurology

## 2021-05-03 NOTE — Progress Notes (Signed)
Patient ID: Charles Golden, male   DOB: [birth year], 87 y.o.   MRN: [id number] Pt's neurointervention (left ICA PTA/stent) originally scheduled for today has been postponed until tomorrow due to code stroke case. Nurse notified.

## 2021-05-03 NOTE — Progress Notes (Signed)
Referring Physician(s): P Sethi  Supervising Physician: Pedro Earls  Patient Status:  Charles Golden - In-pt  Chief Complaint:  L ICA stenosis  Subjective:  Scheduled in IR today for Left internal carotid artery stenosis- intervention Angioplasty/stent with Dr Harrold Donath  No Neuro complaints this am- Did not sleep well and has had no BM x 2 days Denies headache; N/V Denies dizzy or speech/vision changes    Allergies: Demerol [meperidine] and Spiriva handihaler [tiotropium bromide monohydrate]  Medications: Prior to Admission medications   Medication Sig Start Date End Date Taking? Authorizing Provider  aspirin EC 325 MG EC tablet Take 1 tablet (325 mg total) by mouth daily. 04/29/21   Ghimire, Henreitta Leber, MD  atorvastatin (LIPITOR) 40 MG tablet Take 1 tablet (40 mg total) by mouth daily. 04/29/21   Ghimire, Henreitta Leber, MD  azelastine (ASTELIN) 0.1 % nasal spray PLACE 1 SPRAY IN EACH NOSTRIL ONCE A DAY AS DIRECTED 01/17/21   Gottschalk, Ashly M, DO  BREO ELLIPTA 200-25 MCG/INH AEPB Inhale 1 puff into the lungs daily. 01/17/21   [name], DO  Cholecalciferol (VITAMIN D3) 2000 UNITS TABS Take 1 tablet by mouth daily.    [provider]  clopidogrel (PLAVIX) 75 MG tablet Take 1 tablet (75 mg total) by mouth daily. 04/29/21   Ghimire, Henreitta Leber, MD  Cyanocobalamin (VITAMIN B-12) 5000 MCG SUBL Place 1 tablet under the tongue daily.    [provider]  finasteride (PROSCAR) 5 MG tablet Take 5 mg by mouth daily.    [provider]  fluticasone (FLONASE) 50 MCG/ACT nasal spray Place 2 sprays into both nostrils daily.    [provider]  guaiFENesin (MUCINEX) 600 MG 12 hr tablet Take 600 mg by mouth daily as needed for to loosen phlegm.    [provider]  latanoprost (XALATAN) 0.005 % ophthalmic solution Place 1 drop into the left eye at bedtime. Patient taking differently: Place 1 drop into both eyes at bedtime.  06/01/16   Chipper Herb, MD  pantoprazole (PROTONIX) 40 MG tablet Take 1 tablet (40 mg total) by mouth daily. 04/28/21 04/28/22  Ghimire, Henreitta Leber, MD  tamsulosin (FLOMAX) 0.4 MG CAPS capsule Take 0.8 mg by mouth daily. 02/11/19   [provider]  timolol (TIMOPTIC) 0.5 % ophthalmic solution Place 1 drop into the left eye every morning. 11/24/18   [provider]  VENTOLIN HFA 108 (90 Base) MCG/ACT inhaler INHALE 2 PUFFS EVERY 6 HOURS AS NEEDED FOR WHEEZING OR SHORTNESS OF BREATH Patient taking differently: 1 puff every 6 (six) hours as needed for shortness of breath. 02/28/21   [name], DO     Vital Signs: BP 125/74   Pulse 66   Temp 98.3 F (36.8 C) (Oral)   Resp (!) 22   Ht 5\' 11"  (1.803 m)   Wt 167 lb 1.7 oz (75.8 kg)   SpO2 97%   BMI 23.31 kg/m   Physical Exam Vitals reviewed.  HENT:     Mouth/Throat:     Mouth: Mucous membranes are moist.  Eyes:     Extraocular Movements: Extraocular movements intact.  Cardiovascular:     Rate and Rhythm: Normal rate and regular rhythm.     Heart sounds: Normal heart sounds.  Pulmonary:     Effort: Pulmonary effort is normal.     Breath sounds: Normal breath sounds.  Abdominal:     Palpations: Abdomen is soft.  Tenderness: There is abdominal tenderness.  Musculoskeletal:        General: Normal range of motion.  Skin:    General: Skin is warm.  Neurological:     Mental Status: He is alert and oriented to person, place, and time.  Psychiatric:        Mood and Affect: Mood normal.        Behavior: Behavior normal.        Thought Content: Thought content normal.        Judgment: Judgment normal.    Imaging: MR BRAIN WO CONTRAST  Result Date: 04/29/2021 CLINICAL DATA:  Acute neurologic deficit EXAM: MRI HEAD WITHOUT CONTRAST TECHNIQUE: Multiplanar, multiecho pulse sequences of the brain and surrounding structures were obtained without intravenous contrast. COMPARISON:  None. FINDINGS: Brain: There  are 2 areas of acute infarction within the left frontal lobe, the larger of which is in the insula. No acute hemorrhage. Normal white matter signal. Generalized volume loss without a clear lobar predilection. Focus of chronic microhemorrhage in the right cerebellum the midline structures are normal. Vascular: Magnetic susceptibility effect within the left MCA likely indicates a site of thrombus. Skull and upper cervical spine: Normal calvarium and skull base. Visualized upper cervical spine and soft tissues are normal. Sinuses/Orbits:No paranasal sinus fluid levels or advanced mucosal thickening. No mastoid or middle ear effusion. Normal orbits. IMPRESSION: 1. Two areas of acute infarction within the left frontal lobe, the larger of which is in the insula. No hemorrhage or mass effect. 2. Magnetic susceptibility effect within the left MCA likely indicates a site of thrombus. Electronically Signed   By: Ulyses Jarred M.D.   On: 04/29/2021 19:16   CT HEAD CODE STROKE WO CONTRAST  Result Date: 05/01/2021 CLINICAL DATA:  Code stroke.  Acute neurologic deficit EXAM: CT HEAD WITHOUT CONTRAST TECHNIQUE: Contiguous axial images were obtained from the base of the skull through the vertex without intravenous contrast. COMPARISON:  Brain MRI 08/06/2020 FINDINGS: Brain: There is no mass, hemorrhage or extra-axial collection. There is generalized atrophy without lobar predilection. There is hypoattenuation of the periventricular white matter, most commonly indicating chronic ischemic microangiopathy. Hypoattenuation of the site of recent left insular infarct. Vascular: No abnormal hyperdensity of the major intracranial arteries or dural venous sinuses. No intracranial atherosclerosis. Skull: The visualized skull base, calvarium and extracranial soft tissues are normal. Sinuses/Orbits: No fluid levels or advanced mucosal thickening of the visualized paranasal sinuses. No mastoid or middle ear effusion. The orbits are normal.  ASPECTS [facility] Stroke Program Early CT Score) - Ganglionic level infarction (caudate, lentiform nuclei, internal capsule, insula, M1-M3 cortex): 7 - Supraganglionic infarction (M4-M6 cortex): 3 Total score (0-10 with 10 being normal): 10 IMPRESSION: 1. No acute intracranial abnormality. 2. ASPECTS is 10. 3. A recent left insular infarct, as demonstrated on MRI 04/29/2021. These results were called by telephone at the time of interpretation on 05/01/2021 at 8:41 pm to Dr. Erlinda Hong, Who verbally acknowledged these results. Electronically Signed   By: Ulyses Jarred M.D.   On: 05/01/2021 20:42   CT HEAD CODE STROKE WO CONTRAST`  Result Date: 04/29/2021 CLINICAL DATA:  Code stroke. 87 year old male with aphasia. Occluded left internal carotid artery and left MCA M2 branches on CTA 2 days ago. EXAM: CT HEAD WITHOUT CONTRAST TECHNIQUE: Contiguous axial images were obtained from the base of the skull through the vertex without intravenous contrast. COMPARISON:  [facility] CT head, CTA head and neck 04/27/2021. Brain MRI yesterday. FINDINGS: Brain: Small area  of cytotoxic edema in the left insula corresponding to abnormal diffusion yesterday. Nearby hyperdense left MCA M2 branch in the sylvian fissure persist. But elsewhere gray-white matter differentiation remains stable since 04/27/2021. No acute intracranial hemorrhage identified. No midline shift, mass effect, or evidence of intracranial mass lesion. No ventriculomegaly. Vascular: Calcified atherosclerosis at the skull base. Persistent hyperdense left MCA M2 branch. Skull: No acute osseous abnormality identified. Sinuses/Orbits: Visualized paranasal sinuses and mastoids are stable and well aerated. Other: No acute orbit or scalp soft tissue finding. ASPECTS [facility] Stroke Program Early CT Score) Total score (0-10 with 10 being normal): 9 (abnormal left insula as seen by MRI yesterday). IMPRESSION: 1. Expected CT appearance of the small left insular infarct on  MRI yesterday. Elsewhere stable gray-white matter differentiation since the CT on 04/27/2021. ASPECTS 9. 2. No intracranial hemorrhage or mass effect. Study discussed by telephone with Dr. Davonna Belling on 04/29/2021 at 09:19 . Electronically Signed   By: Genevie Ann M.D.   On: 04/29/2021 09:20   CT ANGIO HEAD NECK W WO CM W PERF (CODE STROKE)  Result Date: 04/29/2021 CLINICAL DATA:  Neuro deficit, acute, stroke suspected EXAM: CT ANGIOGRAPHY HEAD AND NECK CT PERFUSION BRAIN TECHNIQUE: Multidetector CT imaging of the head and neck was performed using the standard protocol during bolus administration of intravenous contrast. Multiplanar CT image reconstructions and MIPs were obtained to evaluate the vascular anatomy. Carotid stenosis measurements (when applicable) are obtained utilizing NASCET criteria, using the distal internal carotid diameter as the denominator. Multiphase CT imaging of the brain was performed following IV bolus contrast injection. Subsequent parametric perfusion maps were calculated using RAPID software. CONTRAST:  171mL OMNIPAQUE IOHEXOL 350 MG/ML SOLN COMPARISON:  Recent CTA and MRI brain FINDINGS: CTA NECK Aortic arch: Calcified plaque along the arch and patent great vessel origins. Right carotid system: Patent. Stable appearance. No hemodynamically significant stenosis at the ICA origin. Left carotid system: Patent common carotid. There has been some recanalization of the internal carotid artery. Persistent mixed plaque including irregular noncalcified plaque along the proximal ICA with superimposed thrombus. There is focal near occlusion with submillimeter residual lumen. Vessel caliber remains small reflecting flow limitation. Vertebral arteries: Patent.  Stable appearance. Skeleton: Stable advanced cervical spine degenerative changes. Other neck: No new abnormality. Upper chest: Emphysema.  No new abnormality. Review of the MIP images confirms the above findings CTA HEAD Anterior  circulation: As in the neck, there is no flow present within the left ICA. Left M1 MCA is patent. Persistent left M2 MCA occlusion although now slightly more distal. There is some reconstitution at the M2-M3 junction as before. Right middle and both anterior cerebral arteries are patent. Posterior circulation: Intracranial vertebral arteries are patent. Basilar artery is patent. Major cerebellar artery origins are patent. Posterior cerebral arteries are patent. Venous sinuses: Patent as allowed by contrast bolus timing. Review of the MIP images confirms the above findings CT Brain Perfusion Findings: CBF (<30%) Volume: 68mL Perfusion (Tmax>6.0s) volume: 61mL Mismatch Volume: 66mL Infarction Location: Left MCA territory IMPRESSION: Interval partial recanalization of the cervical left ICA. Remains focal near occlusion with submillimeter lumen and decreased distal vessel caliber reflecting flow limitation. Intracranially, the left ICA is now patent. There is a persistent left M2 MCA occlusion, but the occlusion now occurs more distally within the sylvian fissure. Some distal reconstitution is present. Perfusion imaging demonstrates no core infarction and 83 mL of penumbra in the left MCA territory. Electronically Signed   By: Addison Lank.D.  On: 04/29/2021 10:01    Labs:  CBC: Recent Labs    04/29/21 0902 05/01/21 2018 05/01/21 2039 05/02/21 0204 05/03/21 0346  WBC 7.7 8.5  --  7.8 8.5  HGB 14.4 14.5 14.6 13.0 13.3  HCT 44.4 43.6 43.0 39.2 38.8*  PLT 256 255  --  215 236    COAGS: Recent Labs    04/27/21 1826 04/29/21 0902 05/01/21 2018  INR 1.1 1.1 1.1  APTT 28 27 32    BMP: Recent Labs    06/10/20 1032 12/13/20 1429 04/29/21 0902 05/01/21 2018 05/01/21 2039 05/02/21 0204 05/03/21 0346  NA 145*   < > 138 131* 133* 134* 135  K 5.2   < > 4.5 4.4 4.4 3.9 4.4  CL 108*   < > 106 101 100 107 103  CO2 22   < > 24 21*  --  19* 23  GLUCOSE 85   < > 103* 109* 106* 101* 92  BUN 20    < > 26* 21 24* 19 17  CALCIUM 9.5   < > 8.7* 8.9  --  8.2* 8.3*  CREATININE 1.55*   < > 1.58* 1.71* 1.80* 1.54* 1.55*  GFRNONAA 38*   < > 41* 37*  --  42* 41*  GFRAA 44*  --   --   --   --   --   --    < > = values in this interval not displayed.    LIVER FUNCTION TESTS: Recent Labs    02/01/21 1227 04/27/21 1826 04/29/21 0902 05/01/21 2018  BILITOT 0.5 0.7 0.9 0.8  AST 18 23 25 25   ALT 12 12 14 14   ALKPHOS 88 67 75 80  PROT 6.8 6.0* 6.4* 6.1*  ALBUMIN 3.9 3.5 3.6 3.3*    Assessment and Plan:  Left internal carotid artery stenosis Scheduled today for intervention with Dr Tennis Must Sindy Messing Risks and benefits of cerebral angiogram with intervention were discussed with the patient including, but not limited to bleeding, infection, vascular injury, contrast induced renal failure, stroke or even death.  This interventional procedure involves the use of X-rays and because of the nature of the planned procedure, it is possible that we will have prolonged use of X-ray fluoroscopy.  Potential radiation risks to you include (but are not limited to) the following: - A slightly elevated risk for cancer  several years later in life. This risk is typically less than 0.5% percent. This risk is low in comparison to the normal incidence of human cancer, which is 33% for women and 50% for men according to the [facility]. - Radiation induced injury can include skin redness, resembling a rash, tissue breakdown / ulcers and hair loss (which can be temporary or permanent).   The likelihood of either of these occurring depends on the difficulty of the procedure and whether you are sensitive to radiation due to previous procedures, disease, or genetic conditions.   IF your procedure requires a prolonged use of radiation, you will be notified and given written instructions for further action.  It is your responsibility to monitor the irradiated area for the 2 weeks following the  procedure and to notify your physician if you are concerned that you have suffered a radiation induced injury.    All of the patient's questions were answered, patient is agreeable to proceed.  Consent signed and in chart.   Electronically Signed: Lavonia Drafts, PA-C 05/03/2021, 6:43 AM   I spent a total  of 15 Minutes at the the patient's bedside AND on the patient's hospital floor or unit, greater than 50% of which was counseling/coordinating care for L ICA angioplasty/stent

## 2021-05-03 NOTE — Progress Notes (Signed)
SLP Cancellation Note and D/C:  Patient Details Name: Charles Golden MRN: [id number] DOB: [birth year]   Cancelled evaluation: [name] known to SLP after recent admission. Spoke with him and his daughter at bedside this am - awaiting procedure. Pt's communication is again back to baseline with no further aphasia/dysarthria. No formal evaluation needed. Our service will sign off. Pt/daughter verbalize agreement.  Orli Degrave L. [name], [place] CCC/SLP Acute Rehabilitation Services Office number [phone] Pager [phone]            [name] 05/03/2021, 9:31 AM

## 2021-05-03 NOTE — Telephone Encounter (Signed)
Transition Care Management Unsuccessful Follow-up Telephone Call  Date of discharge and from where:  05/01/21 - Charles Golden - [facility]  Attempts:  2nd Attempt  Reason for unsuccessful TCM follow-up call:  Left voice message

## 2021-05-03 NOTE — Progress Notes (Signed)
OT Cancellation Note  Patient Details Name: TAVIS KRING MRN: [id number] DOB: [birth year]   Cancelled Treatment:    Reason Eval/Treat Not Completed: Active bedrest order (pending IR for L ICA angioplasty/stent)  Billey Chang, OTR/L  Acute Rehabilitation Services Pager: [phone] Office: [phone] .  05/03/2021, 8:10 AM

## 2021-05-04 ENCOUNTER — Inpatient Hospital Stay (HOSPITAL_COMMUNITY): Payer: Medicare Other | Admitting: Anesthesiology

## 2021-05-04 ENCOUNTER — Inpatient Hospital Stay (HOSPITAL_COMMUNITY): Payer: Medicare Other

## 2021-05-04 ENCOUNTER — Other Ambulatory Visit (HOSPITAL_COMMUNITY): Payer: Self-pay

## 2021-05-04 ENCOUNTER — Encounter (HOSPITAL_COMMUNITY): Payer: Self-pay | Admitting: Internal Medicine

## 2021-05-04 ENCOUNTER — Encounter (HOSPITAL_COMMUNITY)
Admission: EM | Disposition: A | Discharge status: Home Health | Payer: Self-pay | Source: Home / Self Care | Attending: Internal Medicine

## 2021-05-04 HISTORY — PX: IR INTRAVSC STENT CERV CAROTID W/EMB-PROT MOD SED INCL ANGIO: IMG2303

## 2021-05-04 HISTORY — PX: IR ANGIO INTRA EXTRACRAN SEL INTERNAL CAROTID UNI L MOD SED: IMG5361

## 2021-05-04 HISTORY — PX: RADIOLOGY WITH ANESTHESIA: SHX6223

## 2021-05-04 HISTORY — PX: IR US GUIDE VASC ACCESS RIGHT: IMG2390

## 2021-05-04 LAB — CBC
HCT: 40.1 % (ref 39.0–52.0)
Hemoglobin: 13.2 g/dL (ref 13.0–17.0)
MCH: 30.8 pg (ref 26.0–34.0)
MCHC: 32.9 g/dL (ref 30.0–36.0)
MCV: 93.5 fL (ref 80.0–100.0)
Platelets: 242 10*3/uL (ref 150–400)
RBC: 4.29 MIL/uL (ref 4.22–5.81)
RDW: 12.9 % (ref 11.5–15.5)
WBC: 8.6 10*3/uL (ref 4.0–10.5)
nRBC: 0 % (ref 0.0–0.2)

## 2021-05-04 LAB — BASIC METABOLIC PANEL
Anion gap: 10 (ref 5–15)
BUN: 19 mg/dL (ref 8–23)
CO2: 20 mmol/L — ABNORMAL LOW (ref 22–32)
Calcium: 8.5 mg/dL — ABNORMAL LOW (ref 8.9–10.3)
Chloride: 105 mmol/L (ref 98–111)
Creatinine, Ser: 1.65 mg/dL — ABNORMAL HIGH (ref 0.61–1.24)
GFR, Estimated: 38 mL/min — ABNORMAL LOW (ref >60)
Glucose, Bld: 91 mg/dL (ref 70–99)
Potassium: 4 mmol/L (ref 3.5–5.1)
Sodium: 135 mmol/L (ref 135–145)

## 2021-05-04 LAB — MAGNESIUM: Magnesium: 1.9 mg/dL (ref 1.7–2.4)

## 2021-05-04 LAB — POCT ACTIVATED CLOTTING TIME: Activated Clotting Time: 143 seconds

## 2021-05-04 LAB — HEPARIN LEVEL (UNFRACTIONATED): Heparin Unfractionated: 1.02 IU/mL — ABNORMAL HIGH (ref 0.30–0.70)

## 2021-05-04 SURGERY — IR WITH ANESTHESIA
Anesthesia: General | Laterality: Left

## 2021-05-04 MED ORDER — GLYCOPYRROLATE PF 0.2 MG/ML IJ SOSY
PREFILLED_SYRINGE | INTRAMUSCULAR | Status: DC | PRN
  Administered 2021-05-04: .2 mg via INTRAVENOUS

## 2021-05-04 MED ORDER — CLEVIDIPINE BUTYRATE 0.5 MG/ML IV EMUL
0.0000 mg/h | INTRAVENOUS | Status: DC
  Administered 2021-05-04: 2 mg/h via INTRAVENOUS
  Filled 2021-05-04: qty 50

## 2021-05-04 MED ORDER — FENTANYL CITRATE (PF) 250 MCG/5ML IJ SOLN
INTRAMUSCULAR | Status: DC | PRN
  Administered 2021-05-04 (×5): 25 ug via INTRAVENOUS

## 2021-05-04 MED ORDER — CHLORHEXIDINE GLUCONATE 0.12 % MT SOLN
OROMUCOSAL | Status: AC
  Administered 2021-05-04: 15 mL via OROMUCOSAL
  Filled 2021-05-04: qty 15

## 2021-05-04 MED ORDER — IOHEXOL 300 MG/ML  SOLN
100.0000 mL | Freq: Once | INTRAMUSCULAR | Status: AC | PRN
  Administered 2021-05-04: 75 mL via INTRA_ARTERIAL

## 2021-05-04 MED ORDER — HEPARIN (PORCINE) 25000 UT/250ML-% IV SOLN
INTRAVENOUS | Status: AC
  Filled 2021-05-04: qty 250

## 2021-05-04 MED ORDER — ACETAMINOPHEN 325 MG PO TABS: 650.0000 mg | ORAL_TABLET | ORAL | Status: DC | PRN

## 2021-05-04 MED ORDER — LIDOCAINE HCL 1 % IJ SOLN
INTRAMUSCULAR | Status: AC
  Administered 2021-05-04: 8 mL
  Filled 2021-05-04: qty 20

## 2021-05-04 MED ORDER — ONDANSETRON HCL 4 MG/2ML IJ SOLN: 4.0000 mg | Freq: Four times a day (QID) | INTRAMUSCULAR | Status: DC | PRN

## 2021-05-04 MED ORDER — HEPARIN (PORCINE) 25000 UT/250ML-% IV SOLN
500.0000 [IU]/h | INTRAVENOUS | Status: DC
  Administered 2021-05-04: 500 [IU]/h via INTRAVENOUS

## 2021-05-04 MED ORDER — LABETALOL HCL 5 MG/ML IV SOLN
INTRAVENOUS | Status: DC | PRN
  Administered 2021-05-04: 10 mg via INTRAVENOUS

## 2021-05-04 MED ORDER — HEPARIN (PORCINE) 25000 UT/250ML-% IV SOLN
600.0000 [IU]/h | INTRAVENOUS | Status: DC
  Administered 2021-05-04: 600 [IU]/h via INTRAVENOUS

## 2021-05-04 MED ORDER — FENTANYL CITRATE (PF) 250 MCG/5ML IJ SOLN
INTRAMUSCULAR | Status: AC
  Filled 2021-05-04: qty 5

## 2021-05-04 MED ORDER — CLOPIDOGREL BISULFATE 75 MG PO TABS: 75.0000 mg | ORAL_TABLET | Freq: Every day | ORAL | Status: DC

## 2021-05-04 MED ORDER — ACETAMINOPHEN 650 MG RE SUPP: 650.0000 mg | RECTAL | Status: DC | PRN

## 2021-05-04 MED ORDER — ASPIRIN 81 MG PO CHEW: 324.0000 mg | CHEWABLE_TABLET | Freq: Every day | ORAL | Status: DC

## 2021-05-04 MED ORDER — ORAL CARE MOUTH RINSE: 15.0000 mL | Freq: Once | OROMUCOSAL | Status: AC

## 2021-05-04 MED ORDER — ASPIRIN 325 MG PO TABS
325.0000 mg | ORAL_TABLET | Freq: Every day | ORAL | Status: DC
  Administered 2021-05-05 – 2021-05-06 (×2): 325 mg via ORAL
  Filled 2021-05-04 (×2): qty 1

## 2021-05-04 MED ORDER — ACETAMINOPHEN 160 MG/5ML PO SOLN: 650.0000 mg | ORAL | Status: DC | PRN

## 2021-05-04 MED ORDER — ONDANSETRON HCL 4 MG/2ML IJ SOLN
INTRAMUSCULAR | Status: DC | PRN
  Administered 2021-05-04: 4 mg via INTRAVENOUS

## 2021-05-04 MED ORDER — HEPARIN SODIUM (PORCINE) 1000 UNIT/ML IJ SOLN
INTRAMUSCULAR | Status: DC | PRN
  Administered 2021-05-04: 5000 [IU] via INTRAVENOUS

## 2021-05-04 MED ORDER — HYDRALAZINE HCL 20 MG/ML IJ SOLN: 5.0000 mg | INTRAMUSCULAR | Status: DC | PRN

## 2021-05-04 MED ORDER — CHLORHEXIDINE GLUCONATE 0.12 % MT SOLN: 15.0000 mL | Freq: Once | OROMUCOSAL | Status: AC

## 2021-05-04 MED ORDER — SODIUM CHLORIDE 0.9 % IV SOLN: INTRAVENOUS | Status: DC

## 2021-05-04 MED ORDER — MIDODRINE HCL 2.5 MG PO TABS
2.5000 mg | ORAL_TABLET | Freq: Two times a day (BID) | ORAL | Status: DC
  Administered 2021-05-05: 2.5 mg via ORAL
  Filled 2021-05-04 (×2): qty 1

## 2021-05-04 MED ORDER — IOHEXOL 300 MG/ML  SOLN
100.0000 mL | Freq: Once | INTRAMUSCULAR | Status: AC | PRN
  Administered 2021-05-04: 25 mL via INTRA_ARTERIAL

## 2021-05-04 MED ORDER — CLOPIDOGREL BISULFATE 75 MG PO TABS
75.0000 mg | ORAL_TABLET | Freq: Every day | ORAL | Status: DC
  Administered 2021-05-05 – 2021-05-06 (×2): 75 mg via ORAL
  Filled 2021-05-04 (×2): qty 1

## 2021-05-04 NOTE — Procedures (Signed)
INTERVENTIONAL NEURORADIOLOGY BRIEF POSTPROCEDURE NOTE  DIAGNOSTIC CEREBRAL ANGIOGRAM AND LEFT CAROTID ANGIOPLASTY AND STENTING WITH CEREBRAL PROTECTION DEVICE  Attending: Dr, Pedro Earls  Assistant: None  Diagnosis: Left ICA severe stenosis  Access site: RCFA, 8 F  Access closure: 35F angioseal  Anesthesia: MAC  Medication used: Refer to anesthesia documentation.  Complications: None  Estimated blood loss: 50 mL  Specimen: None  Findings: Severe stenosis of the left ICA at the bulb with string sign. Angioplasty and stenting performed with cerebral protection device with complete resolution of stenosis. Post intervention angiogram showed missing left M3 branch corresponding to occluded branch on prior CTA. Patient answered orientation questions appropriately and had full strength in right upper and lower extremity.  The patient tolerated the procedure well without incident or complication and is in stable condition.   PLAN: - SBP 120-140 mmHg for 24 h - Bed rest x 6h - Continue DAPT with ASA and Plavix

## 2021-05-04 NOTE — Progress Notes (Signed)
OT Cancellation Note  Patient Details Name: Charles Golden MRN: [id number] DOB: 12-29-1925   Cancelled Treatment:    Reason Eval/Treat Not Completed: Patient at procedure or test/ unavailable.  Patient being transported to procedure, continue efforts as appropriate.   Charles Golden 05/04/2021, 9:21 AM

## 2021-05-04 NOTE — Progress Notes (Addendum)
STROKE TEAM PROGRESS NOTE   INTERVAL HISTORY Patient is resting comfortably in bed.  Daughters are at the bedside.  Plan for inpatient stenting of the left ICA today.  No new episodes of aphasia or dysarthria.  Patient states he is feeling good.  Plans for evaluation with speech therapy, physical therapy, occupational therapy after procedure.  Patient is hemodynamically stable.  Laboratory values are unremarkable.   Vitals:   05/04/21 0800 05/04/21 0838 05/04/21 0900 05/04/21 1000  BP: 128/74  130/76 140/68  Pulse: 61  61 60  Resp: 17  (!) 21 20  Temp: 98.6 F (37 C)   98.2 F (36.8 C)  TempSrc: Oral   Oral  SpO2: 95% 95% 95% 95%  Weight:      Height:       CBC:  Recent Labs  Lab 04/29/21 0902 05/01/21 2018 05/01/21 2039 05/03/21 0346 05/04/21 0119  WBC 7.7 8.5   < > 8.5 8.6  NEUTROABS 5.3 6.0  --   --   --   HGB 14.4 14.5   < > 13.3 13.2  HCT 44.4 43.6   < > 38.8* 40.1  MCV 96.7 93.6   < > 92.8 93.5  PLT 256 255   < > 236 242   < > = values in this interval not displayed.    Basic Metabolic Panel:  Recent Labs  Lab 05/03/21 0346 05/04/21 0119  NA 135 135  K 4.4 4.0  CL 103 105  CO2 23 20*  GLUCOSE 92 91  BUN 17 19  CREATININE 1.55* 1.65*  CALCIUM 8.3* 8.5*  MG 1.8 1.9    Lipid Panel:  Recent Labs  Lab 04/30/21 0126  CHOL 105  TRIG 64  HDL 42  CHOLHDL 2.5  VLDL 13  LDLCALC 50    HgbA1c:  Recent Labs  Lab 04/30/21 0126  HGBA1C 5.8*    Urine Drug Screen:  Recent Labs  Lab 05/02/21 0055  LABOPIA NONE DETECTED  COCAINSCRNUR NONE DETECTED  LABBENZ NONE DETECTED  AMPHETMU NONE DETECTED  THCU NONE DETECTED  LABBARB NONE DETECTED     Alcohol Level  Recent Labs  Lab 05/01/21 2018  ETH <10     IMAGING past 24 hours No results found.  PHYSICAL EXAM General:  Patient is pleasant elderly Caucasian male alert, well-developed male in no acute distress  NEURO:  Mental Status: AA&Ox3  Speech/Language: speech is without dysarthria or  aphasia.  Repetition, fluency, and comprehension intact.  Cranial Nerves:  II: PERRL. Visual fields full.  III, IV, VI: EOMI. Eyelids elevate symmetrically.  V: Sensation is intact to light touch and symmetrical to face.  VII: Smile is symmetrical.  VIII: hearing intact to voice. IX, X:  Phonation is normal.  XII: tongue is midline without fasciculations. Motor: 5/5 strength to all muscle groups tested.  Sensation- Intact to light touch bilaterally. Extinction absent to light touch to DSS.  Coordination: FTN intact bilaterally Gait- deferred   ASSESSMENT/PLAN [name] is a 87 y.o. male with history of  history of BPH, COP, CKD stage IIIb, and recent stroke on 04/27/2021 who was readmitted earlier this week for repeat symptoms of aphasia.  He was discharged yesterday at 1200 and returned to the ED at 1800 with another episode of aphasia which has since resolved.  He has been readmitted and will undergo stenting of the left ICA.  Midodrine may be used to prevent hypotension which has led to episodes of aphasia.  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 ASPECTS 10. CTA head & neck Interval - partial recanalization of the cervical left ICA. Remains focal near occlusion with submillimeter lumen and decreased distal vessel caliber reflecting flow limitation. Intracranially, the left ICA is now patent. There is a persistent left M2 MCA occlusion, but the occlusion now occurs more distally within the sylvian fissure. Some distal reconstitution is present.  Perfusion imaging demonstrates no core infarction and 83 mL of penumbra in the left MCA territory. MRI  Two areas of acute infarction within the left frontal lobe, the larger of which is in the insula. Magnetic  susceptibility effect within the left MCA likely indicates a site of thrombus. 2D Echo Normal left ventricular function, EF 60-65% LDL 50 HgbA1c 5.8 VTE prophylaxis - lovenox 40mg     Diet   Diet Heart Room service appropriate? Yes; Fluid consistency: Thin   aspirin 325 mg daily and clopidogrel 75 mg daily prior to admission, now on aspirin 325 mg daily and clopidogrel 75 mg daily. Continue prior medications Therapy recommendations: Pending Disposition: pending  Left ICA severe stenosis Angioplasty and stenting performed with cerebral protection device with complete resolution of stenosis. Post intervention angiogram showed missing left M3 branch corresponding to occluded branch on prior CTA.  No complications from procedure, patient is in PACU    Hypertension History / Hypotension Stable Avoid low BP, long term goal 120-140 post procedure for 24 hours May add midodrine to prevent further episodes of hypotension  Hyperlipidemia Home meds:  Atorvastatin 40mg , resumed in hospital LDL 50, goal < 70 Continue statin at discharge        Diabetes type II Controlled HgbA1c 5.8, goal < 7.0 CBGs - No results for input(s): GLUCAP in the last 72 hours. SSI        Other Stroke Risk Factors Advanced Age >/= 43  Hx stroke/TIA Migraines- patient does report using Excedrin migraine for headaches       Other Active Problems COPD - Continue home bronchodilator  BPH -> Proscar - (may contribute to hypotension)  CKD IIIb - at baseline function - Monitor renal function - Creatinine - 1.55->1.65 (Normal saline at 75 cc / hour) Monitor urine output Hypotension -> midodrine Left carotid stenosis - by CTA neck 04/29/21 - Interval partial recanalization of the cervical left ICA. Remains focal near occlusion with submillimeter lumen and decreased distal vessel caliber reflecting flow limitation. Intracranially, the left ICA is now patent. There is a persistent left M2 MCA occlusion, but the occlusion  now occurs more distally within the sylvian fissure. Some distal reconstitution is present. (ASA and Plavix currently) Dr Debbrah Alar - consult 05/01/21 -> left ICA angioplasty/stent placement planned 12/7.  Post procedure- bed rest 6 hours BP 120-140 for 24 hours Continue NS at 74 continue DAPT  Hospital day # 2   Patient seen and examined by NP/APP with MD. MD to update note as needed.   Janine Ores, DNP, FNP-BC [facility] Pager: [phone]  I have personally obtained history,examined this patient, reviewed notes, independently viewed imaging studies, participated in medical decision making and plan of care.ROS completed by me personally and pertinent positives fully documented  I have made any additions or clarifications directly to the above note. Agree with note above.  For elective left carotid angioplasty  and stenting today.  Long discussion with patient and daughters at bedside and answered questions.  Greater than 50% time during this 25-minute visit spent in counseling and coordination of care about her stroke and symptomatic carotid stenosis and discussion about revascularization questions.  Antony Contras, MD Medical Director [facility] Pager: [phone] 05/04/2021 3:10 PM    To contact Stroke Continuity provider, please refer to [email or web address] After hours, contact General Neurology

## 2021-05-04 NOTE — Progress Notes (Signed)
PT Cancellation Note  Patient Details Name: Charles Golden MRN: [id number] DOB: March 02, 1926   Cancelled Treatment:    Reason Eval/Treat Not Completed: Patient at procedure or test/unavailable - will check back as appropriate.   Stacie Glaze, PT DPT Acute Rehabilitation Services Pager [phone]  Office [phone]    Louis Matte 05/04/2021, 12:43 PM

## 2021-05-04 NOTE — Anesthesia Postprocedure Evaluation (Addendum)
Anesthesia Post Note  Patient: Charles Golden  Procedure(s) Performed: LEFT ICA STENT (Left)     Patient location during evaluation: PACU Anesthesia Type: MAC Level of consciousness: awake and alert, oriented and patient cooperative Pain management: pain level controlled Vital Signs Assessment: post-procedure vital signs reviewed and stable Respiratory status: spontaneous breathing, nonlabored ventilation and respiratory function stable Cardiovascular status: blood pressure returned to baseline and stable Postop Assessment: no apparent nausea or vomiting Anesthetic complications: no   No notable events documented.  Last Vitals:  Vitals:   05/04/21 1330 05/04/21 1345  BP: 127/80 128/66  Pulse: 73 66  Resp: 15 19  Temp:  36.5 C  SpO2: 97% 96%    Last Pain:  Vitals:   05/04/21 1330  TempSrc:   PainSc: 0-No pain                 Pervis Hocking

## 2021-05-04 NOTE — Progress Notes (Signed)
Brief NIR note:  Patient s/p left ICA angioplasty/stent placement earlier today. Patient seen in ICU, daughters at bedside. Per RN groin intermittently bleeding with small hematoma but otherwise doing well. Patient asking when he can sit up and have coffee, feels well.   Right CFA puncture site with small hematoma directly above puncture site, no active bleeding on exam. Quick clot applied and covered with tegaderm.  Plan: - Bed rest x 6 hours from procedure if no further bleeding from right CFA puncture site - Advance diet as tolerated once can sit up - SBP 120-140 mmHg x 24H - Continue DAPT (Plavix + ASA) - NIR will follow up again 12/9  Candiss Norse, PA-C

## 2021-05-04 NOTE — Progress Notes (Signed)
Report given to PACU. All belongings removed from patient and CHG bath done.   Montez Hageman RN

## 2021-05-04 NOTE — Anesthesia Preprocedure Evaluation (Addendum)
Anesthesia Evaluation  Patient identified by MRN, date of birth, ID band Patient awake    Reviewed: Allergy & Precautions, NPO status , Patient's Chart, lab work & pertinent test results  Airway Mallampati: II  TM Distance: >3 FB Neck ROM: Full    Dental  (+) Dental Advisory Given, Missing,    Pulmonary COPD,  COPD inhaler, former smoker,  Former smoker, 30 pack year history  Quit smoking 2008   Pulmonary exam normal breath sounds clear to auscultation       Cardiovascular negative cardio ROS Normal cardiovascular exam Rhythm:Regular Rate:Normal     Neuro/Psych  Headaches, Initially a code stroke on 12/1- dysarthria and aphasia TIACVA, Residual Symptoms negative psych ROS   GI/Hepatic Neg liver ROS, GERD  Controlled and Medicated,IBS   Endo/Other  negative endocrine ROS  Renal/GU Renal InsufficiencyRenal diseaseCr 1.65   negative genitourinary   Musculoskeletal negative musculoskeletal ROS (+)   Abdominal   Peds  Hematology negative hematology ROS (+) hct 40.1, plt 242   Anesthesia Other Findings   Reproductive/Obstetrics negative OB ROS                           Anesthesia Physical Anesthesia Plan  ASA: 2  Anesthesia Plan: MAC   Post-op Pain Management:    Induction:   PONV Risk Score and Plan: 2 and Treatment may vary due to age or medical condition, TIVA and Propofol infusion  Airway Management Planned: Natural Airway and Simple Face Mask  Additional Equipment: Arterial line  Intra-op Plan:   Post-operative Plan:   Informed Consent: I have reviewed the patients History and Physical, chart, labs and discussed the procedure including the risks, benefits and alternatives for the proposed anesthesia with the patient or authorized representative who has indicated his/her understanding and acceptance.     Dental advisory given  Plan Discussed with: CRNA  Anesthesia Plan  Comments:       Anesthesia Quick Evaluation

## 2021-05-04 NOTE — Anesthesia Procedure Notes (Signed)
Arterial Line Insertion Start/End12/12/2020 9:50 AM, 05/04/2021 10:00 AM Performed by: Ardyth Harps, CRNA, CRNA  Patient location: Pre-op. Preanesthetic checklist: patient identified, IV checked, site marked, risks and benefits discussed, surgical consent, monitors and equipment checked, pre-op evaluation, timeout performed and anesthesia consent Lidocaine 1% used for infiltration Left, radial was placed Catheter size: 20 G Hand hygiene performed  and maximum sterile barriers used   Attempts: 1 Procedure performed without using ultrasound guided technique. Following insertion, dressing applied and Biopatch. Post procedure assessment: normal and unchanged  Patient tolerated the procedure well with no immediate complications.

## 2021-05-04 NOTE — Progress Notes (Signed)
Referring Physician(s): Sethi,P  Supervising Physician: Pedro Earls  Patient Status:  [name] - In-pt  Chief Complaint:  Left internal carotid artery stenosis  Subjective: Pt scheduled in NIR today for left ICA angioplasty/stenting; daughter in room; reports no new c/o; has had BM; specifically denies visual problems, speech problems, numbness /tingling of extremities, fever,HA,CP,dyspnea, cough, abd pain,N/V or bleeding   Past Medical History:  Diagnosis Date   Bronchitis, chronic (HCC)    COPD (chronic obstructive pulmonary disease) ([place])    Enlarged prostate    GERD (gastroesophageal reflux disease)    Glaucoma    Glucagonoma    Hernia, incisional    at present   [name] (hard of hearing)    IBS (irritable bowel syndrome)    Sciatic pain    Sinus congestion    Past Surgical History:  Procedure Laterality Date   BOWEL RESECTION N/A 09/04/2012   Procedure: SMALL BOWEL RESECTION;  Surgeon: Donato Heinz, MD;  Location: [facility];  Service: General;  Laterality: N/A;  Anastimosis   HEMORRHOID SURGERY     HERNIA REPAIR Bilateral 70's   INGUINAL HERNIA REPAIR Left 09/14/2013   Procedure: LEFT INGUINAL HERNIORRHAPHY;  Surgeon: Jamesetta So, MD;  Location: [facility];  Service: General;  Laterality: Left;   INGUINAL HERNIA REPAIR Right 12/23/2018   Procedure: RECURRENT RIGHT INGUINAL HERNIA  REPAIR  WITH MESH;  Surgeon: Aviva Signs, MD;  Location: [facility];  Service: General;  Laterality: Right;   INSERTION OF MESH Left 09/14/2013   Procedure: INSERTION OF MESH;  Surgeon: Jamesetta So, MD;  Location: [facility];  Service: General;  Laterality: Left;   KNEE ARTHROSCOPY Left 07/21/2014   Procedure: LEFT KNEE ARTHROSCOPY WITH MEDIAL MENISCAL DEBRIDEMENT ;  Surgeon: Gearlean Alf, MD;  Location: [facility];  Service: Orthopedics;  Laterality: Left;   LAPAROTOMY N/A 09/04/2012   Procedure: EXPLORATORY LAPAROTOMY;  Surgeon: Donato Heinz, MD;  Location: [facility];  Service:  General;  Laterality: N/A;   SKIN LESION EXCISION     [name]   TONSILLECTOMY        Allergies: Demerol [meperidine] and Spiriva handihaler [tiotropium bromide monohydrate]  Medications: Prior to Admission medications   Medication Sig Start Date End Date Taking? Authorizing Provider  aspirin EC 325 MG EC tablet Take 1 tablet (325 mg total) by mouth daily. 04/29/21   Ghimire, Henreitta Leber, MD  atorvastatin (LIPITOR) 40 MG tablet Take 1 tablet (40 mg total) by mouth daily. 04/29/21   Ghimire, Henreitta Leber, MD  azelastine (ASTELIN) 0.1 % nasal spray PLACE 1 SPRAY IN EACH NOSTRIL ONCE A DAY AS DIRECTED 01/17/21   Gottschalk, Ashly M, DO  BREO ELLIPTA 200-25 MCG/INH AEPB Inhale 1 puff into the lungs daily. 01/17/21   [name], DO  Cholecalciferol (VITAMIN D3) 2000 UNITS TABS Take 1 tablet by mouth daily.    [provider]  clopidogrel (PLAVIX) 75 MG tablet Take 1 tablet (75 mg total) by mouth daily. 04/29/21   Ghimire, Henreitta Leber, MD  Cyanocobalamin (VITAMIN B-12) 5000 MCG SUBL Place 1 tablet under the tongue daily.    [provider]  finasteride (PROSCAR) 5 MG tablet Take 5 mg by mouth daily.    [provider]  fluticasone (FLONASE) 50 MCG/ACT nasal spray Place 2 sprays into both nostrils daily.    [provider]  guaiFENesin (MUCINEX) 600 MG 12 hr tablet Take 600 mg by mouth daily as needed for to loosen phlegm.  [provider]  latanoprost (XALATAN) 0.005 % ophthalmic solution Place 1 drop into the left eye at bedtime. Patient taking differently: Place 1 drop into both eyes at bedtime. 06/01/16   Chipper Herb, MD  pantoprazole (PROTONIX) 40 MG tablet Take 1 tablet (40 mg total) by mouth daily. 04/28/21 04/28/22  Ghimire, Henreitta Leber, MD  tamsulosin (FLOMAX) 0.4 MG CAPS capsule Take 0.8 mg by mouth daily. 02/11/19   [provider]  timolol (TIMOPTIC) 0.5 % ophthalmic solution Place 1 drop into the left eye every morning. 11/24/18    [provider]  VENTOLIN HFA 108 (90 Base) MCG/ACT inhaler INHALE 2 PUFFS EVERY 6 HOURS AS NEEDED FOR WHEEZING OR SHORTNESS OF BREATH Patient taking differently: 1 puff every 6 (six) hours as needed for shortness of breath. 02/28/21   [name], DO     Vital Signs: BP 126/76   Pulse 64   Temp 98.3 F (36.8 C) (Oral)   Resp 18   Ht 5\' 11"  (1.803 m)   Wt 167 lb 1.7 oz (75.8 kg)   SpO2 95%   BMI 23.31 kg/m   Physical Exam awake/alert; face symm, speech nl, tongue midline, no drift, FMM nl, strength 5/5 all fours; chest- CTA bilat; heart- RRR; abd- soft,+BS,not sig tender; no LE edema  Imaging: CT HEAD CODE STROKE WO CONTRAST  Result Date: 05/01/2021 CLINICAL DATA:  Code stroke.  Acute neurologic deficit EXAM: CT HEAD WITHOUT CONTRAST TECHNIQUE: Contiguous axial images were obtained from the base of the skull through the vertex without intravenous contrast. COMPARISON:  Brain MRI 08/06/2020 FINDINGS: Brain: There is no mass, hemorrhage or extra-axial collection. There is generalized atrophy without lobar predilection. There is hypoattenuation of the periventricular white matter, most commonly indicating chronic ischemic microangiopathy. Hypoattenuation of the site of recent left insular infarct. Vascular: No abnormal hyperdensity of the major intracranial arteries or dural venous sinuses. No intracranial atherosclerosis. Skull: The visualized skull base, calvarium and extracranial soft tissues are normal. Sinuses/Orbits: No fluid levels or advanced mucosal thickening of the visualized paranasal sinuses. No mastoid or middle ear effusion. The orbits are normal. ASPECTS [facility] Stroke Program Early CT Score) - Ganglionic level infarction (caudate, lentiform nuclei, internal capsule, insula, M1-M3 cortex): 7 - Supraganglionic infarction (M4-M6 cortex): 3 Total score (0-10 with 10 being normal): 10 IMPRESSION: 1. No acute intracranial abnormality. 2. ASPECTS is 10. 3. A recent left  insular infarct, as demonstrated on MRI 04/29/2021. These results were called by telephone at the time of interpretation on 05/01/2021 at 8:41 pm to Dr. Erlinda Hong, Who verbally acknowledged these results. Electronically Signed   By: Ulyses Jarred M.D.   On: 05/01/2021 20:42    Labs:  CBC: Recent Labs    05/01/21 2018 05/01/21 2039 05/02/21 0204 05/03/21 0346 05/04/21 0119  WBC 8.5  --  7.8 8.5 8.6  HGB 14.5 14.6 13.0 13.3 13.2  HCT 43.6 43.0 39.2 38.8* 40.1  PLT 255  --  215 236 242    COAGS: Recent Labs    04/27/21 1826 04/29/21 0902 05/01/21 2018  INR 1.1 1.1 1.1  APTT 28 27 32    BMP: Recent Labs    06/10/20 1032 12/13/20 1429 05/01/21 2018 05/01/21 2039 05/02/21 0204 05/03/21 0346 05/04/21 0119  NA 145*   < > 131* 133* 134* 135 135  K 5.2   < > 4.4 4.4 3.9 4.4 4.0  CL 108*   < > 101 100 107 103 105  CO2 22   < >  21*  --  19* 23 20*  GLUCOSE 85   < > 109* 106* 101* 92 91  BUN 20   < > 21 24* 19 17 19   CALCIUM 9.5   < > 8.9  --  8.2* 8.3* 8.5*  CREATININE 1.55*   < > 1.71* 1.80* 1.54* 1.55* 1.65*  GFRNONAA 38*   < > 37*  --  42* 41* 38*  GFRAA 44*  --   --   --   --   --   --    < > = values in this interval not displayed.    LIVER FUNCTION TESTS: Recent Labs    02/01/21 1227 04/27/21 1826 04/29/21 0902 05/01/21 2018  BILITOT 0.5 0.7 0.9 0.8  AST 18 23 25 25   ALT 12 12 14 14   ALKPHOS 88 67 75 80  PROT 6.8 6.0* 6.4* 6.1*  ALBUMIN 3.9 3.5 3.6 3.3*    Assessment and Plan: Pt with hx left ICA stenosis, COPD, BPH, GERD, DM, mild renal insufficiency; scheduled today for angioplasty/stenting of left ICA stenosis; Risks and benefits of cerebral angiogram with intervention were discussed with the patient/daughter including, but not limited to bleeding, infection, vascular injury, contrast induced renal failure, stroke or even death.   This interventional procedure involves the use of X-rays and because of the nature of the planned procedure, it is possible that  we will have prolonged use of X-ray fluoroscopy.   Potential radiation risks to you include (but are not limited to) the following: - A slightly elevated risk for cancer       several years later in life. This risk is typically less than 0.5% percent. This risk is low in comparison to the normal incidence of human cancer, which is 33% for women and 50% for men according to the [place]. - Radiation induced injury can include skin redness, resembling a rash, tissue breakdown / ulcers and hair loss (which can be temporary or permanent).    The likelihood of either of these occurring depends on the difficulty of the procedure and whether you are sensitive to radiation due to previous procedures, disease, or genetic conditions.    IF your procedure requires a prolonged use of radiation, you will be notified and given written instructions for further action.  It is your responsibility to monitor the irradiated area for the 2 weeks following the procedure and to notify your physician if you are concerned that you have suffered a radiation induced injury.    All of the patient's questions were answered, patient is agreeable to proceed.   Consent signed and in chart.  CBC nl today, creat 1.65  Electronically Signed: D. Rowe Robert, PA-C 05/04/2021, 8:46 AM   I spent a total of 20 minutes at the the patient's bedside AND on the patient's hospital floor or unit, greater than 50% of which was counseling/coordinating care for cerebral arteriogram with angioplasty/stenting of left internal carotid artery stenosis    Patient ID: Charles Golden, male   DOB: 1925-06-06, 87 y.o.   MRN: [id number]

## 2021-05-04 NOTE — Sedation Documentation (Signed)
Pt transported to PACU bay 10 via bed accompanied by RN and CRNA. Lovena Le RN at bedside to receive pt. Right groin remains level 0. Bilateral lower distal pulses present via doppler. Pt awake alert and oriented. No s/s of distress at this time.

## 2021-05-04 NOTE — Progress Notes (Addendum)
At 1500 groin check patient had no bleeding. I rechecked at 1510 and saw a quarter size blood on gauze dressing. Pressure immediately applied. Dr Debbrah Alar notified. Verbal order to hold pressure for 10 minutes then recheck. Firm pressure held by me on R groin until 1520. Bleeding has stopped. Minimal hematoma. I marked the edges. R dorsalis pedis pulse clearly heard on doppler. Dr Debbrah Alar will come up to see patient. VSS. SBP 129.  Dr Debbrah Alar applied quick klot at bedside. No more bleeding currently. Will continue to monitor.   Montez Hageman RN

## 2021-05-04 NOTE — Progress Notes (Signed)
  Progress Note    Charles Golden   PYP:[id number]  DOB: 01-25-1926  DOA: 05/01/2021     2 PCP: [name], DO  Initial CC: aphasia  Hospital Course: Charles Golden is a 87 y.o. male with medical history significant of hearing loss, BPH, bronchiectasis, COPD, GERD, IBS, RLS, CKD presenting after continued intermittent episodes of aphasia and dysarthria. Patient initially presented on 12/1 for acute stroke secondary to left ICA and M2 occlusion. Patient had stroke work-up at that time and also was readmitted multiple times since. Symptoms had continued to return intermittently, thus he was brought back to the ER.  -IR consulted,  plan for inpatient stenting of the left ICA.  Interval History:  -Denies any complaints, procedure got postponed till today  Assessment & Plan:  Recurrent left CVA Left ICA stenosis > Patient with recurrent admissions for intermittent stroke symptoms of aphasia and dysarthria. > Initially presented on 12/1 found to have occluded left ICA and M2.  With left insular infarct.  Plan was for outpatient neuro interventional radiology with stenting.  Had recurrent symptoms - evaluated by neuro and IR; awaiting left ICA stenting, procedure got postponed to today - continue neuro checks - continue asa, plavix, statin  - cut back on IV fluids today, continue low-dose midodrine   COPD -Stable - Continue home Breo and as needed albuterol  BPH - Continue home finasteride   GERD - Protonix  DMII - continue diet control - A1c 5.8% on 04/30/21  Chronic constipation -Senokot, MiraLAX PRN  DVT prophylaxis: Lovenox  Code Status:   Code Status: Full Code Family: d/w daughter at bedside  Disposition Plan:   Status is: Inpt  Objective: Blood pressure 140/68, pulse 60, temperature 98.2 F (36.8 C), temperature source Oral, resp. rate 20, height 5\' 11"  (1.803 m), weight 75.8 kg, SpO2 95 %.   Examination:  Gen: Awake, Alert, Oriented X 3,  HEENT: no  JVD Lungs: Good air movement bilaterally, CTAB CVS: S1S2/RRR Abd: soft, Non tender, non distended, BS present Extremities: No edema Neuro: Moves all extremities, no localizing signs Skin: no new rashes on exposed skin   Consultants:  Neuro IR  Procedures:    Data Reviewed: I have personally reviewed labs and imaging studies     LOS: 2 days  Time spent: 31min  Domenic Polite, MD [facility] 05/04/2021, 12:05 PM

## 2021-05-04 NOTE — Transfer of Care (Signed)
Immediate Anesthesia Transfer of Care Note  Patient: Charles Golden  Procedure(s) Performed: LEFT ICA STENT (Left)  Patient Location: PACU  Anesthesia Type:MAC  Level of Consciousness: awake, alert , oriented and patient cooperative  Airway & Oxygen Therapy: Patient Spontanous Breathing  Post-op Assessment: Report given to RN and Post -op Vital signs reviewed and stable  Post vital signs: Reviewed and stable  Last Vitals:  Vitals Value Taken Time  BP 137/84 05/04/21 1300  Temp 36.5 C 05/04/21 1300  Pulse 69 05/04/21 1311  Resp 14 05/04/21 1310  SpO2 96 % 05/04/21 1311  Vitals shown include unvalidated device data.  Last Pain:  Vitals:   05/04/21 1300  TempSrc:   PainSc: 0-No pain         Complications: No notable events documented.

## 2021-05-04 NOTE — Anesthesia Procedure Notes (Signed)
Procedure Name: MAC Date/Time: 05/04/2021 11:33 AM Performed by: Oletta Lamas, CRNA Pre-anesthesia Checklist: Patient identified, Emergency Drugs available, Suction available, Patient being monitored and Timeout performed Patient Re-evaluated:Patient Re-evaluated prior to induction Oxygen Delivery Method: Nasal cannula

## 2021-05-04 NOTE — Progress Notes (Signed)
ANTICOAGULATION CONSULT NOTE - Initial Consult  Pharmacy Consult for IV Heparin Indication:  Post-interventional Neuroradiology Procedure  Allergies  Allergen Reactions   Demerol [Meperidine] Other (See Comments)    Pt. States "he woke up during a colonoscopy"   Spiriva Handihaler [Tiotropium Bromide Monohydrate] Other (See Comments)    Mild Urinary Retention    Patient Measurements: Height: 5\' 11"  (180.3 cm) Weight: 75.8 kg (167 lb 1.7 oz) IBW/kg (Calculated) : 75.3 Heparin Dosing Weight: 75 kg  Vital Signs: Temp: 97.7 F (36.5 C) (12/08 1345) Temp Source: Oral (12/08 1000) BP: 128/66 (12/08 1345) Pulse Rate: 66 (12/08 1345)  Labs: Recent Labs    05/01/21 2018 05/01/21 2039 05/02/21 0204 05/03/21 0346 05/04/21 0119  HGB 14.5   < > 13.0 13.3 13.2  HCT 43.6   < > 39.2 38.8* 40.1  PLT 255  --  215 236 242  APTT 32  --   --   --   --   LABPROT 14.2  --   --   --   --   INR 1.1  --   --   --   --   CREATININE 1.71*   < > 1.54* 1.55* 1.65*   < > = values in this interval not displayed.    Estimated Creatinine Clearance: 29.8 mL/min (A) (by C-G formula based on SCr of 1.65 mg/dL (H)).   Medical History: Past Medical History:  Diagnosis Date   Bronchitis, chronic (HCC)    COPD (chronic obstructive pulmonary disease) (HCC)    Enlarged prostate    GERD (gastroesophageal reflux disease)    Glaucoma    Glucagonoma    Hernia, incisional    at present   [facility] (hard of hearing)    IBS (irritable bowel syndrome)    Sciatic pain    Sinus congestion     Medications:  Scheduled:    stroke: mapping our early stages of recovery book   Does not apply Once   [START ON 05/05/2021] aspirin  325 mg Oral Daily   Or   [START ON 05/05/2021] aspirin  324 mg Per Tube Daily   aspirin  325 mg Oral Daily   atorvastatin  40 mg Oral Daily   Chlorhexidine Gluconate Cloth  6 each Topical Daily   clopidogrel  75 mg Oral Daily   [START ON 05/05/2021] clopidogrel  75 mg Oral Daily   Or    [START ON 05/05/2021] clopidogrel  75 mg Per Tube Daily   finasteride  5 mg Oral Daily   fluticasone furoate-vilanterol  1 puff Inhalation Daily   latanoprost  1 drop Both Eyes QHS   midodrine  2.5 mg Oral BID WC   pantoprazole  40 mg Oral Daily   senna-docusate  1 tablet Oral BID    Assessment: 87 years of age male status post ICA stent placement 12/8. Pharmacy consulted for Heparin post-placement for low goal of 0.1 to 0.25 units/mL.   CBC stable and within normal limits.  Initiated in PACU on 500 units/hr.   Goal of Therapy:  Heparin level 0.1 to 0.25 units/ml Monitor platelets by anticoagulation protocol: Yes   Plan:  Increase Heparin to 600 units/hr.  Heparin level in 8 hours.  Heparin to stop at at 7 AM for sheath removal.   Sloan Leiter, PharmD, BCPS, BCCCP Clinical Pharmacist Please refer to [facility] for [place] numbers 05/04/2021,2:25 PM

## 2021-05-05 ENCOUNTER — Encounter (HOSPITAL_COMMUNITY): Payer: Self-pay | Admitting: Neuroradiology

## 2021-05-05 DIAGNOSIS — I959 Hypotension, unspecified: Secondary | ICD-10-CM

## 2021-05-05 DIAGNOSIS — E43 Unspecified severe protein-calorie malnutrition: Secondary | ICD-10-CM | POA: Insufficient documentation

## 2021-05-05 DIAGNOSIS — J449 Chronic obstructive pulmonary disease, unspecified: Secondary | ICD-10-CM | POA: Diagnosis not present

## 2021-05-05 LAB — HEPARIN LEVEL (UNFRACTIONATED): Heparin Unfractionated: 0.18 IU/mL — ABNORMAL LOW (ref 0.30–0.70)

## 2021-05-05 LAB — BASIC METABOLIC PANEL
Anion gap: 10 (ref 5–15)
BUN: 17 mg/dL (ref 8–23)
CO2: 22 mmol/L (ref 22–32)
Calcium: 8 mg/dL — ABNORMAL LOW (ref 8.9–10.3)
Chloride: 103 mmol/L (ref 98–111)
Creatinine, Ser: 1.45 mg/dL — ABNORMAL HIGH (ref 0.61–1.24)
GFR, Estimated: 45 mL/min — ABNORMAL LOW (ref >60)
Glucose, Bld: 103 mg/dL — ABNORMAL HIGH (ref 70–99)
Potassium: 4.3 mmol/L (ref 3.5–5.1)
Sodium: 135 mmol/L (ref 135–145)

## 2021-05-05 MED ORDER — TAMSULOSIN HCL 0.4 MG PO CAPS
0.4000 mg | ORAL_CAPSULE | Freq: Every day | ORAL | Status: DC
  Administered 2021-05-05 – 2021-05-06 (×2): 0.4 mg via ORAL
  Filled 2021-05-05 (×2): qty 1

## 2021-05-05 MED ORDER — MIDODRINE HCL 5 MG PO TABS
5.0000 mg | ORAL_TABLET | Freq: Two times a day (BID) | ORAL | Status: DC
  Administered 2021-05-05 – 2021-05-06 (×3): 5 mg via ORAL
  Filled 2021-05-05 (×3): qty 1

## 2021-05-05 MED ORDER — SODIUM CHLORIDE 0.9 % IV BOLUS
500.0000 mL | Freq: Once | INTRAVENOUS | Status: AC
  Administered 2021-05-05: 500 mL via INTRAVENOUS

## 2021-05-05 MED ORDER — ENSURE ENLIVE PO LIQD: 237.0000 mL | Freq: Three times a day (TID) | ORAL | Status: DC

## 2021-05-05 NOTE — Progress Notes (Signed)
  Progress Note    Charles Golden   HER:[id number]  DOB: [birth year]  DOA: 05/01/2021     3 PCP: Charles Norlander, DO  Initial CC: aphasia  Hospital Course: Charles Golden is a 87 y.o. male with medical history significant of hearing loss, BPH, bronchiectasis, COPD, GERD, IBS, RLS, CKD presenting after continued intermittent episodes of aphasia and dysarthria. Patient initially presented on 12/1 for acute stroke secondary to left ICA and M2 occlusion. Patient had stroke work-up at that time and also was readmitted multiple times since. Symptoms had continued to return intermittently, thus he was brought back to the ER.  -IR consulted,  plan for inpatient stenting of the left ICA.  Interval History:  -Had issues with recurrent urinary retention, required I's/O cath x2  Assessment & Plan:  Recurrent left CVA Left ICA stenosis > Patient with recurrent admissions for intermittent stroke symptoms of aphasia and dysarthria. > Initially presented on 12/1 found to have occluded left ICA and M2.  With left insular infarct.  Plan was for outpatient neuro interventional radiology with stenting.  Had recurrent symptoms - evaluated by neuro and IR; underwent left carotid angioplasty and stenting - continue neuro checks - continue asa, plavix, statin  - Continue low-dose midodrine today  BPH, urinary retention -Remains on Flomax 0.4 mg daily, lower dose- to avoid hypotension in above setting -Continue finasteride -s/p In-N-Out cath last night and early this morning, increase activity, ambulate as tolerated -If urinary retention recurs will need to place Foley catheter, discussed this with patient and daughter   COPD -Stable - Continue home Breo and as needed albuterol   GERD - Protonix  DMII - continue diet control - A1c 5.8% on 04/30/21  Chronic constipation -Senokot, MiraLAX PRN  DVT prophylaxis: Lovenox  Code Status:   Code Status: Full Code Family: d/w daughter at  bedside  Disposition Plan: Home tomorrow if stable Status is: Inpt  Objective: Blood pressure 127/89, pulse 75, temperature 98.1 F (36.7 C), temperature source Oral, resp. rate 20, height 5\' 11"  (1.803 m), weight 75.8 kg, SpO2 100 %.   Examination:  Gen: Awake, Alert, Oriented X 3, hard of hearing, no distress HEENT: no JVD Lungs: Good air movement bilaterally, CTAB CVS: S1S2/RRR Abd: soft, Non tender, non distended, BS present Extremities: No edema Neuro: Moves all extremities, no localizing signs Skin: no new rashes on exposed skin   Consultants:  Neuro IR  Procedures: Diagnostic cerebral angiogram with left carotid angioplasty and stenting Dr. Sindy Messing   Data Reviewed: I have personally reviewed labs and imaging studies     LOS: 3 days  Time spent: 101min  Domenic Polite, MD [facility] 05/05/2021, 10:34 AM

## 2021-05-05 NOTE — Progress Notes (Signed)
[employer] for IV Heparin Indication:  Post-interventional Neuroradiology Procedure  Allergies  Allergen Reactions   Demerol [Meperidine] Other (See Comments)    Pt. States "he woke up during a colonoscopy"   Spiriva Handihaler [Tiotropium Bromide Monohydrate] Other (See Comments)    Mild Urinary Retention    Patient Measurements: Height: 5\' 11"  (180.3 cm) Weight: 75.8 kg (167 lb 1.7 oz) IBW/kg (Calculated) : 75.3 Heparin Dosing Weight: 75 kg  Vital Signs: Temp: 98.2 F (36.8 C) (12/09 0000) Temp Source: Oral (12/09 0000) BP: 107/79 (12/09 0300) Pulse Rate: 62 (12/09 0300)  Labs: Recent Labs    05/03/21 0346 05/04/21 0119 05/04/21 1445 05/05/21 0325  HGB 13.3 13.2  --   --   HCT 38.8* 40.1  --   --   PLT 236 242  --   --   HEPARINUNFRC  --   --  1.02* 0.18*  CREATININE 1.55* 1.65*  --   --      Estimated Creatinine Clearance: 29.8 mL/min (A) (by C-G formula based on SCr of 1.65 mg/dL (H)).   Assessment: 87 years of age male status post ICA stent placement 12/8. Pharmacy consulted for Heparin post-placement for low goal of 0.1 to 0.25 units/mL.   Heparin level 0.18 (therapeutic) on gtt at 600 units/hr. No bleeding noted.  Goal of Therapy:  Heparin level 0.1 to 0.25 units/ml Monitor platelets by anticoagulation protocol: Yes   Plan:  Continue heparin at 600 units/hr. Heparin to stop at at 7 AM for sheath removal.   Sherlon Handing, PharmD, BCPS Please see amion for complete clinical pharmacist phone list 05/05/2021,4:22 AM

## 2021-05-05 NOTE — Addendum Note (Signed)
Addendum  created 05/05/21 1343 by Pervis Hocking, DO   Actions taken from a BestPractice Advisory, Clinical Note Signed, Problem List modified

## 2021-05-05 NOTE — Evaluation (Signed)
Occupational Therapy Evaluation Patient Details Name: Charles Golden MRN: [id number] DOB: 08/21/1925 Today's Date: 05/05/2021   History of Present Illness 87 yo male admitted on 05/01/21 with persistent aphasia after initial CVA on 04/27/21.  Pt now s/p L ICA angioplasty/stent on 05/04/21.  Pt has had low BP so was transferred to ICU. Pt with hx including hearing loss, BPH, bronchiectasis, COPD, GERD, IBS, RLS, CKD glauacoma   Clinical Impression   PT admitted with s/p L ICA angioplasty/ stent. Pt currently with functional limitiations due to the deficits listed below (see OT problem list). Pt noted to have balance deficits. Pt expressing decreased mobility over last week has made him weak. Daughters x3 to assist upon d/c home. Pt eager to d/c home. Pt reports not need to void bladder but when give the opportunity this session able to void 200 CC static standing with UE support.  Pt will benefit from skilled OT to increase their independence and safety with adls and balance to allow discharge [place].  *extremely hard of hearing and reads lips       Recommendations for follow up therapy are one component of a multi-disciplinary discharge planning process, led by the attending physician.  Recommendations may be updated based on patient status, additional functional criteria and insurance authorization.   Follow Up Recommendations  Home health OT    Assistance Recommended at Discharge Intermittent Supervision/Assistance  Functional Status Assessment  Patient has had a recent decline in their functional status and demonstrates the ability to make significant improvements in function in a reasonable and predictable amount of time.  Equipment Recommendations  None recommended by OT    Recommendations for Other Services       Precautions / Restrictions Precautions Precautions: Fall      Mobility Bed Mobility Overal bed mobility: Modified Independent Bed Mobility: Sit to Supine       Sit  to supine: Supervision   General bed mobility comments: needs cues for lines and leads safety. if pt was in a standard bed he woudl be mod I    Transfers Overall transfer level: Needs assistance Equipment used: None Transfers: Sit to/from Stand Sit to Stand: Min guard           General transfer comment: min guard with mild unsteadiness      Balance Overall balance assessment: Needs assistance Sitting-balance support: No upper extremity supported Sitting balance-Leahy Scale: Normal     Standing balance support: No upper extremity supported;Single extremity supported;Bilateral upper extremity supported Standing balance-Leahy Scale: Fair Standing balance comment: reliant on surfaces for support                           ADL either performed or assessed with clinical judgement   ADL Overall ADL's : Needs assistance/impaired Eating/Feeding: Independent;Sitting   Grooming: Supervision/safety;Standing                   Toilet Transfer: Min guard;Ambulation;Regular Toilet   Toileting- Clothing Manipulation and Hygiene: Supervision/safety Toileting - Clothing Manipulation Details (indicate cue type and reason): sit and standing both to void. pt successful with static standing     Functional mobility during ADLs: Min guard General ADL Comments: pt reaching for enironmental support. pt expressed feeling unsteady when asked.     Vision Baseline Vision/History: 1 Wears glasses;3 Glaucoma Vision Assessment?: No apparent visual deficits     Perception     Praxis      Pertinent Vitals/Pain Pain  Assessment: No/denies pain     Hand Dominance Right   Extremity/Trunk Assessment Upper Extremity Assessment Upper Extremity Assessment: Overall WFL for tasks assessed   Lower Extremity Assessment Lower Extremity Assessment: Overall WFL for tasks assessed   Cervical / Trunk Assessment Cervical / Trunk Assessment: Normal   Communication  Communication Communication: HOH   Cognition Arousal/Alertness: Awake/alert Behavior During Therapy: WFL for tasks assessed/performed Overall Cognitive Status: Within Functional Limits for tasks assessed                                 General Comments: reports feeling tired. expressing concern with in ability to pee having to do with his prostate and wanting to make a doctor appointment if he ever gets out of here. pt very ready to d/c     General Comments  VSS    Exercises     Shoulder Instructions      Home Living Family/patient expects to be discharged to:: Private residence Living Arrangements: Alone Available Help at Discharge: Family;Available 24 hours/day Type of Home: House Home Access: Stairs to enter [employer] of Steps: 2 Entrance Stairs-Rails: Right Home Layout: One level     Bathroom Shower/Tub: Occupational psychologist: Handicapped height     Home Equipment: Conservation officer, nature (2 wheels);Shower seat;Grab bars - tub/shower;Grab bars - toilet   Additional Comments: home equipment was his deceased wife's  Lives With: Daughter (x3 to (A) upon d/c)    Prior Functioning/Environment Prior Level of Function : Independent/Modified Independent;Driving             Mobility Comments: Pt reports could ambulate in community if needed but daughter's have been assisting with shopping.  Did not use AD ADLs Comments: Pt independent with ADLs, IADLs, and was caregiver for his wife who passed last week        OT Problem List: Impaired balance (sitting and/or standing)      OT Treatment/Interventions: Self-care/ADL training;DME and/or AE instruction;Patient/family education;Balance training;Therapeutic activities    OT Goals(Current goals can be found in the care plan section) Acute Rehab OT Goals Patient Stated Goal: to go home today OT Goal Formulation: With patient Time For Goal Achievement: 05/19/21 Potential to Achieve Goals:  Good  OT Frequency: Min 2X/week   Barriers to D/C:            Co-evaluation              AM-PAC OT "6 Clicks" Daily Activity     Outcome Measure Help from another person eating meals?: None Help from another person taking care of personal grooming?: A Little Help from another person toileting, which includes using toliet, bedpan, or urinal?: A Little Help from another person bathing (including washing, rinsing, drying)?: A Little Help from another person to put on and taking off regular upper body clothing?: None Help from another person to put on and taking off regular lower body clothing?: A Little 6 Click Score: 20   End of Session Nurse Communication: Mobility status;Precautions  Activity Tolerance: Patient tolerated treatment well Patient left: in bed;with call bell/phone within reach;with bed alarm set;with family/visitor present  OT Visit Diagnosis: Unsteadiness on feet (R26.81);Other abnormalities of gait and mobility (R26.89)                Time: 3491-7915 OT Time Calculation (min): 28 min Charges:  OT General Charges $OT Visit: 1 Visit OT Evaluation $OT Eval Moderate Complexity:  1 Mod   Brynn, OTR/L  Acute Rehabilitation Services Pager: [phone] Office: [phone] .   Jeri Modena 05/05/2021, 3:11 PM

## 2021-05-05 NOTE — TOC Initial Note (Signed)
Transition of Care [facility]) - Initial/Assessment Note    Patient Details  Name: Charles Golden MRN: [id number] Date of Birth: Apr 27, 1926  [facility] CM/SW Contact:    Ella Bodo, RN Phone Number: 05/05/2021, 4:11 PM  Clinical Narrative:                 87 yo male admitted on 05/01/21 with persistent aphasia after initial CVA on 04/27/21.  Pt now s/p L ICA angioplasty/stent on 05/04/21. Prior to admission, patient independent and living at home alone; his wife died approximately 2 weeks ago.  PT/OT recommending home health follow-up.  Met with patient's daughter, at bedside; she states that she and her 2 sisters will provide 24-hour assistance at discharge.  Medicare home health list given to patient's daughter; she states she will discuss with her sisters in regard to home health agency choice.  Request home health PT/OT orders from MD.  No DME needed, per patient/family.  Expected Discharge Plan: [place] Barriers to Discharge: Continued Medical Work up   Patient Goals and CMS Choice Patient states their goals for this hospitalization and ongoing recovery are:: return home CMS Medicare.gov Compare Post Acute Care list provided to:: Patient Represenative (must comment) (daughter) Choice offered to / list presented to : Adult Children  Expected Discharge Plan and Services Expected Discharge Plan: [place]   Discharge Planning Services: CM Consult Post Acute Care Choice: [place] arrangements for the past 2 months: Single Family Home                                      Prior Living Arrangements/Services Living arrangements for the past 2 months: Single Family Home Lives with:: Self Patient language and need for interpreter reviewed:: Yes Do you feel safe going back to the place where you live?: Yes      Need for Family Participation in Patient Care: Yes (Comment) Care giver support system in place?: Yes  (comment)   Criminal Activity/Legal Involvement Pertinent to Current Situation/Hospitalization: No - Comment as needed               Emotional Assessment Appearance:: Appears stated age Attitude/Demeanor/Rapport: Engaged Affect (typically observed): Accepting Orientation: : Oriented to Self, Oriented to Place, Oriented to  Time, Oriented to Situation      Admission diagnosis:  CVA (cerebral vascular accident) [facility]) [I63.9] Dysarthria [R47.1] Patient Active Problem List   Diagnosis Date Noted   Protein-calorie malnutrition, severe 05/05/2021   CVA (cerebral vascular accident) ([place]) 05/01/2021   Stenosis of left carotid artery    Acute ischemic stroke ([place]) 04/29/2021   TIA (transient ischemic attack) 04/27/2021   Chronic tension-type headache, not intractable 12/13/2020   Bilateral sensorineural hearing loss 09/25/2019   Bronchiectasis without complication ([place]) 26/71/2458   Fatigue 05/27/2019   Vitamin D deficiency 01/21/2019   Weakness 01/21/2019   Recurrent right inguinal hernia    Presbycusis of both ears 07/19/2017   Restless leg syndrome 11/25/2016   Collagenous colitis 07/24/2016   Deviated nasal septum 09/03/2015   Basal cell carcinoma of nose 09/03/2015   Thoracic aortic atherosclerosis ([place]) 05/03/2015   Upper airway cough syndrome 04/28/2015   GERD without esophagitis 04/11/2015   Acute medial meniscal tear 07/20/2014   COPD GOLD I  12/08/2013   Small bowel volvulus ([place]) 08/05/2013   Chronic headache 08/05/2013   Left  inguinal hernia 08/05/2013   Incisional hernia, without obstruction or gangrene 08/05/2013   Headache 05/01/2013   Obstructive chronic bronchitis without exacerbation ([place]) 04/30/2013   Left hip pain 04/29/2013   BPH (benign prostatic hyperplasia) 01/05/2013   IBS (irritable bowel syndrome) 09/03/2012   Chronic rhinosinusitis 09/03/2012   Lung nodules 05/04/2003   PCP:  [name], DO Pharmacy:   [facility] [place] [address] Phone: [phone] Fax: [phone]  [place], [place] [address] Phone: [phone] Fax: [phone]     Social Determinants of Health (SDOH) Interventions    Readmission Risk Interventions No flowsheet data found.  Reinaldo Raddle, RN, BSN  Trauma/Neuro ICU Case Manager [phone]

## 2021-05-05 NOTE — Evaluation (Signed)
Physical Therapy Evaluation Patient Details Name: Charles Golden MRN: [id number] DOB: Mar 04, 1926 Today's Date: 05/05/2021  History of Present Illness  Pt is 87 yo male admitted on 05/01/21 with persistent aphasia after initial CVA on 04/27/21.  Pt now s/p L ICA angioplasty/stent on 05/04/21.  Pt has had low BP so was transferred to ICU. Pt with hx including hearing loss, BPH, bronchiectasis, COPD, GERD, IBS, RLS, CKD  Clinical Impression  Pt admitted with above diagnosis. At baseline, pt is independent.  His wife recently passed and daughter's have been staying with him and can stay at d/c.  Pt has DME if needed.  Today, pt ambulating 150' with RW and min guard.  He presents with mild instability, decreased generalized strength, and decreased endurance.  Pt currently with functional limitations due to the deficits listed below (see PT Problem List). Pt will benefit from skilled PT to increase their independence and safety with mobility to allow discharge to the venue listed below.          Recommendations for follow up therapy are one component of a multi-disciplinary discharge planning process, led by the attending physician.  Recommendations may be updated based on patient status, additional functional criteria and insurance authorization.  Follow Up Recommendations Home health PT    Assistance Recommended at Discharge Frequent or constant Supervision/Assistance  Functional Status Assessment Patient has had a recent decline in their functional status and demonstrates the ability to make significant improvements in function in a reasonable and predictable amount of time.  Equipment Recommendations  None recommended by PT    Recommendations for Other Services       Precautions / Restrictions Precautions Precautions: Fall      Mobility  Bed Mobility Overal bed mobility: Needs Assistance Bed Mobility: Sit to Supine       Sit to supine: Supervision        Transfers Overall transfer  level: Needs assistance Equipment used: None Transfers: Sit to/from Stand Sit to Stand: Min guard           General transfer comment: min guard with mild unsteadiness    Ambulation/Gait Ambulation/Gait assistance: Min guard Gait Distance (Feet): 150 Feet Assistive device: Rolling walker (2 wheels);None Gait Pattern/deviations: Step-through pattern Gait velocity: decreased     General Gait Details: Started wtihout RW but pt reaching for furniture and mild unsteadiness; with RW improved stability (min cues for RW use)  Stairs            Wheelchair Mobility    Modified Rankin (Stroke Patients Only) Modified Rankin (Stroke Patients Only) Pre-Morbid Rankin Score: No significant disability Modified Rankin: Moderate disability     Balance Overall balance assessment: Needs assistance Sitting-balance support: No upper extremity supported Sitting balance-Leahy Scale: Normal     Standing balance support: No upper extremity supported;Single extremity supported;Bilateral upper extremity supported Standing balance-Leahy Scale: Fair Standing balance comment: Pt with fair static balance without AD but needing RW or at least single UE support for gait                             Pertinent Vitals/Pain Pain Assessment: No/denies pain    Home Living Family/patient expects to be discharged to:: Private residence Living Arrangements: Alone Available Help at Discharge: Family;Available 24 hours/day (Pt's wife passed last weeks.  He has 3 daughters that can stay with him initally at d/c) Type of Home: House Home Access: Stairs to enter Entrance Stairs-Rails: Right  Entrance Stairs-Number of Steps: 2   Home Layout: One level (mostly 1 level; does have 2 steps down into den with rails) Home Equipment: Conservation officer, nature (2 wheels);Shower seat;Grab bars - tub/shower;Grab bars - toilet Additional Comments: home equipment was his deceased wife's    Prior Function Prior  Level of Function : Independent/Modified Independent;Driving             Mobility Comments: Pt reports could ambulate in community if needed but daughter's have been assisting with shopping.  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  Does have some situational confusion -daughter's report that he appears basically at his norm but do agree he may have mild situational confusion.        General Comments General comments (skin integrity, edema, etc.): O2 sats stable on RA; BP sitting 111/64, unable to get in standing but pt asymptomatic, return from walk 136/68. Discussed with pt and daughters - recommendation for RW use, HHPT, and initial supervision. THey are in agreement    Exercises     Assessment/Plan    PT Assessment Patient needs continued PT services  PT Problem List Decreased strength;Decreased balance;Decreased mobility;Decreased knowledge of use of DME;Decreased safety awareness;Decreased activity tolerance       PT Treatment Interventions DME instruction;Gait training;Stair training;Therapeutic activities;Therapeutic exercise;Balance training;Patient/family education;Neuromuscular re-education;Functional mobility training    PT Goals (Current goals can be found in the Care Plan  section)  Acute Rehab PT Goals Patient Stated Goal: to go home Additional Goals Additional Goal #1: Pt will score >19 on DGI to indicate lower fall risk    Frequency Min 4X/week   Barriers to discharge        Co-evaluation               AM-PAC PT "6 Clicks" Mobility  Outcome Measure Help needed turning from your back to your side while in a flat bed without using bedrails?: None Help needed moving from lying on your back to sitting on the side of a flat bed without using bedrails?: A Little Help needed moving to and from a bed to a chair (including a wheelchair)?: A Little Help needed standing up from a chair using your arms (e.g., wheelchair or bedside chair)?: A Little Help needed to walk in hospital room?: A Little Help needed climbing 3-5 steps with a railing? : A Little 6 Click Score: 19    End of Session Equipment Utilized During Treatment: Gait belt Activity Tolerance: Patient tolerated treatment well Patient left: in bed;with call bell/phone within reach;with bed alarm set Nurse Communication: Mobility status PT Visit Diagnosis: Unsteadiness on feet (R26.81)    Time: 1243-1310 PT Time Calculation (min) (ACUTE ONLY): 27 min   Charges:  PT Evaluation $PT Eval Low Complexity: 1 Low PT Treatments $Gait Training: 8-22 mins        Abran Richard, PT Acute Rehab Services Pager [phone] [facility] 05/05/2021, 1:54 PM

## 2021-05-05 NOTE — Progress Notes (Signed)
Referring Physician(s): Antony Contras  Supervising Physician: Pedro Earls  Patient Status:  Charles Golden  Chief Complaint: Follow up left ICA angioplasty + stent placement 05/04/21  Subjective:  Patient seen in ICU, reports difficulty urinating and that he may need a foley catheter placed. He states he urinated a little recently and the nurse is going to do a bladder scan to see if it was enough. He walked around the unit earlier and needed a walker about half the time. Dietician came by to discuss appetite and he is going to start trying ensure. Daughter at bedside during exam.  Allergies: Demerol [meperidine] and Spiriva handihaler [tiotropium bromide monohydrate]  Medications: Prior to Admission medications   Medication Sig Start Date End Date Taking? Authorizing Provider  aspirin EC 325 MG EC tablet Take 1 tablet (325 mg total) by mouth daily. 04/29/21   Ghimire, Henreitta Leber, MD  atorvastatin (LIPITOR) 40 MG tablet Take 1 tablet (40 mg total) by mouth daily. 04/29/21   Ghimire, Henreitta Leber, MD  azelastine (ASTELIN) 0.1 % nasal spray PLACE 1 SPRAY IN EACH NOSTRIL ONCE A DAY AS DIRECTED 01/17/21   Gottschalk, Ashly M, DO  BREO ELLIPTA 200-25 MCG/INH AEPB Inhale 1 puff into the lungs daily. 01/17/21   [name], DO  Cholecalciferol (VITAMIN D3) 2000 UNITS TABS Take 1 tablet by mouth daily.    [provider]  clopidogrel (PLAVIX) 75 MG tablet Take 1 tablet (75 mg total) by mouth daily. 04/29/21   Ghimire, Henreitta Leber, MD  Cyanocobalamin (VITAMIN B-12) 5000 MCG SUBL Place 1 tablet under the tongue daily.    [provider]  finasteride (PROSCAR) 5 MG tablet Take 5 mg by mouth daily.    [provider]  fluticasone (FLONASE) 50 MCG/ACT nasal spray Place 2 sprays into both nostrils daily.    [provider]  guaiFENesin (MUCINEX) 600 MG 12 hr tablet Take 600 mg by mouth daily as needed for to loosen phlegm.    [provider]   latanoprost (XALATAN) 0.005 % ophthalmic solution Place 1 drop into the left eye at bedtime. Patient taking differently: Place 1 drop into both eyes at bedtime. 06/01/16   Chipper Herb, MD  pantoprazole (PROTONIX) 40 MG tablet Take 1 tablet (40 mg total) by mouth daily. 04/28/21 04/28/22  Ghimire, Henreitta Leber, MD  tamsulosin (FLOMAX) 0.4 MG CAPS capsule Take 0.8 mg by mouth daily. 02/11/19   [provider]  timolol (TIMOPTIC) 0.5 % ophthalmic solution Place 1 drop into the left eye every morning. 11/24/18   [provider]  VENTOLIN HFA 108 (90 Base) MCG/ACT inhaler INHALE 2 PUFFS EVERY 6 HOURS AS NEEDED FOR WHEEZING OR SHORTNESS OF BREATH Patient taking differently: 1 puff every 6 (six) hours as needed for shortness of breath. 02/28/21   [name], DO     Vital Signs: BP 135/78   Pulse 67   Temp 98.4 F (36.9 C) (Oral)   Resp (!) 21   Ht 5\' 11"  (1.803 m)   Wt 167 lb 1.7 oz (75.8 kg)   SpO2 100%   BMI 23.31 kg/m   Physical Exam Vitals and nursing note reviewed.  Constitutional:      General: He is not in acute distress. HENT:     Head: Normocephalic.  Cardiovascular:     Rate and Rhythm: Normal rate.     Comments: (+) Right CFA puncture site clean, dry, dressed appropriately. Ecchymosis surrounding puncture site  without hematoma, soft, non pulsatile, minimally tender. Pulmonary:     Effort: Pulmonary effort is normal.  Abdominal:     Palpations: Abdomen is soft.  Skin:    General: Skin is warm and dry.  Neurological:     Mental Status: He is alert. Mental status is at baseline.    Imaging: CT HEAD CODE STROKE WO CONTRAST  Result Date: 05/01/2021 CLINICAL DATA:  Code stroke.  Acute neurologic deficit EXAM: CT HEAD WITHOUT CONTRAST TECHNIQUE: Contiguous axial images were obtained from the base of the skull through the vertex without intravenous contrast. COMPARISON:  Brain MRI 08/06/2020 FINDINGS: Brain: There is no mass, hemorrhage or extra-axial  collection. There is generalized atrophy without lobar predilection. There is hypoattenuation of the periventricular white matter, most commonly indicating chronic ischemic microangiopathy. Hypoattenuation of the site of recent left insular infarct. Vascular: No abnormal hyperdensity of the major intracranial arteries or dural venous sinuses. No intracranial atherosclerosis. Skull: The visualized skull base, calvarium and extracranial soft tissues are normal. Sinuses/Orbits: No fluid levels or advanced mucosal thickening of the visualized paranasal sinuses. No mastoid or middle ear effusion. The orbits are normal. ASPECTS [facility] Stroke Program Early CT Score) - Ganglionic level infarction (caudate, lentiform nuclei, internal capsule, insula, M1-M3 cortex): 7 - Supraganglionic infarction (M4-M6 cortex): 3 Total score (0-10 with 10 being normal): 10 IMPRESSION: 1. No acute intracranial abnormality. 2. ASPECTS is 10. 3. A recent left insular infarct, as demonstrated on MRI 04/29/2021. These results were called by telephone at the time of interpretation on 05/01/2021 at 8:41 pm to Dr. Erlinda Hong, Who verbally acknowledged these results. Electronically Signed   By: Ulyses Jarred M.D.   On: 05/01/2021 20:42    Labs:  CBC: Recent Labs    05/01/21 2018 05/01/21 2039 05/02/21 0204 05/03/21 0346 05/04/21 0119  WBC 8.5  --  7.8 8.5 8.6  HGB 14.5 14.6 13.0 13.3 13.2  HCT 43.6 43.0 39.2 38.8* 40.1  PLT 255  --  215 236 242    COAGS: Recent Labs    04/27/21 1826 04/29/21 0902 05/01/21 2018  INR 1.1 1.1 1.1  APTT 28 27 32    BMP: Recent Labs    06/10/20 1032 12/13/20 1429 05/02/21 0204 05/03/21 0346 05/04/21 0119 05/05/21 0325  NA 145*   < > 134* 135 135 135  K 5.2   < > 3.9 4.4 4.0 4.3  CL 108*   < > 107 103 105 103  CO2 22   < > 19* 23 20* 22  GLUCOSE 85   < > 101* 92 91 103*  BUN 20   < > 19 17 19 17   CALCIUM 9.5   < > 8.2* 8.3* 8.5* 8.0*  CREATININE 1.55*   < > 1.54* 1.55* 1.65* 1.45*   GFRNONAA 38*   < > 42* 41* 38* 45*  GFRAA 44*  --   --   --   --   --    < > = values in this interval not displayed.    LIVER FUNCTION TESTS: Recent Labs    02/01/21 1227 04/27/21 1826 04/29/21 0902 05/01/21 2018  BILITOT 0.5 0.7 0.9 0.8  AST 18 23 25 25   ALT 12 12 14 14   ALKPHOS 88 67 75 80  PROT 6.8 6.0* 6.4* 6.1*  ALBUMIN 3.9 3.5 3.6 3.3*    Assessment and Plan:  87 y/o M s/p left ICA angioplasty/stent placement 05/04/21 with Dr Tennis Must Sindy Messing seen today for post procedure follow  up. Patient with history of BPH and new urinary retention this admission, he has required in and out cath several time since procedure and may possibly need foley catheter. Doing well from neurologic standpoint, ambulating with walker, no slurred speech or residual weakness.  Right CFA puncture site with ecchymosis surrounding site but no hematoma, soft, non pulsatile, minimally tender. Continue routine wound care.  Patient ok for discharge from NIR point of view - he will undergo carotid US and follow up appointment with Dr Tennis Must Sindy Messing in 3 months (scheduler will call him to set this up, contact info in AVS). Patient instructed to continue Plavix 75 mg PO QD + ASA 81 mg PO QD upon discharge, his daughter is going to help him with medications.  No further NIR needs at this time, discharge planning per primary team. Please call with questions or concerns.  Electronically Signed: Joaquim Nam, PA-C 05/05/2021, 2:16 PM   I spent a total of 15 Minutes at the the patient's bedside AND on the patient's hospital floor or unit, greater than 50% of which was counseling/coordinating care for left ICA angioplasty/stent placement follow up.

## 2021-05-05 NOTE — Progress Notes (Signed)
Initial Nutrition Assessment  DOCUMENTATION CODES:   Severe malnutrition in context of acute illness/injury  INTERVENTION:   Ensure Enlive po TID, each supplement provides 350 kcal and 20 grams of protein  Magic cup TID with meals, each supplement provides 290 kcal and 9 grams of protein  Encourage oral nutrition supplements at home until PO intake improves. - formal education completed last week by RD.   NUTRITION DIAGNOSIS:   Severe Malnutrition related to acute illness (loss of wife/new stroke) as evidenced by moderate fat depletion, moderate muscle depletion, energy intake < or equal to 50% for > or equal to 5 days.  GOAL:   Patient will meet greater than or equal to 90% of their needs  MONITOR:   PO intake, Supplement acceptance  REASON FOR ASSESSMENT:   Rounds    ASSESSMENT:   Pt with PMH of BPH, COPD, CKD stage III, who is very HOH, recent stroke 04/27/2021 day of wife's funeral now readmitted with another episdoe of aphasia.   Pt discussed during ICU rounds and with RN.  RN concerned about poor appetite.   Spoke with pt and his daughter. Pt very HOH and daughter helped with communication. They report that pt has not ate much more than a few bites since his wife passed 2 weeks ago. Prior to this they report no weight loss.   Per chart review pt has had 5% weight loss since 10/22 weight. And a 2% weight loss x 4 days.   Pt has drank some chocolate Boost before but not consistently and is willing to try ensure here. I also offered him a chocolate magic cup from floor stock and he is willing to try this as well.   Breakfast: 10%   12/8 s/p ICA angioplasty with stent placement   Medications reviewed and include: senokot-s Labs reviewed   NUTRITION - FOCUSED PHYSICAL EXAM:  Flowsheet Row Most Recent Value  Orbital Region Unable to assess  Upper Arm Region Moderate depletion  Thoracic and Lumbar Region Moderate depletion  Buccal Region Moderate depletion   Temple Region Moderate depletion  Clavicle Bone Region Mild depletion  Clavicle and Acromion Bone Region Moderate depletion  Scapular Bone Region Moderate depletion  Dorsal Hand Moderate depletion  Patellar Region Moderate depletion  Anterior Thigh Region Moderate depletion  Posterior Calf Region Moderate depletion  Edema (RD Assessment) None  Hair Reviewed  Eyes Reviewed  Mouth Reviewed  Skin Reviewed  Nails Reviewed       Diet Order:   Diet Order             Diet regular Room service appropriate? Yes; Fluid consistency: Thin  Diet effective now                   EDUCATION NEEDS:   Education needs have been addressed  Skin:  Skin Assessment: Reviewed RN Assessment  Last BM:  12/9  Height:   Ht Readings from Last 1 Encounters:  05/02/21 5\' 11"  (1.803 m)    Weight:   Wt Readings from Last 1 Encounters:  05/02/21 75.8 kg    BMI:  Body mass index is 23.31 kg/m.  Estimated Nutritional Needs:   Kcal:  1800-2000  Protein:  90-110 grams  Fluid:  > 1.8 L/day  Lockie Pares., RD, LDN, CNSC See AMiON for contact information

## 2021-05-05 NOTE — Consult Note (Signed)
NAME:  Charles Golden, MRN:  [id number], DOB:  10/26/1925, LOS: 3 ADMISSION DATE:  05/01/2021, CONSULTATION DATE: 05/05/2021. REFERRING MD: Dr. Broadus John, CHIEF COMPLAINT: CVA  History of Present Illness:  This is a 87 year old white male that presented to [facility] for scheduled interventional radiology procedure to remove known left ICA stenosis.  On 05/04/2021 the patient went to IR and had cerebral angiogram and left carotid angioplasty with stenting performed.  He was then transferred to the neuro intensive care unit.  Initially he was hypertensive and required them administration of Cleviprex but after being straight cathed for 700 cc of urine the patient was able to relax and became relatively hypotensive in light of a goal of systolic blood pressure 097-353.  The patient not received his 1800 dose of midodrine.  Currently the patient denies any new physical complaints.  He is difficult of hearing but with a hearing aid in place he was able to answer all questions.  Pertinent  Medical History  COPD CVA CKD stage III BPH Left ICA severe stenosis  Significant Hospital Events: Including procedures, antibiotic start and stop dates in addition to other pertinent events      Objective   Blood pressure 107/79, pulse 62, temperature 98.2 F (36.8 C), temperature source Oral, resp. rate 14, height 5\' 11"  (1.803 m), weight 75.8 kg, SpO2 100 %.        Intake/Output Summary (Last 24 hours) at 05/05/2021 0315 Last data filed at 05/05/2021 0200 Gross per 24 hour  Intake 976.63 ml  Output 750 ml  Net 226.63 ml   Filed Weights   05/02/21 1147  Weight: 75.8 kg    Examination: General: No acute distress HENT: Atraumatic/normocephalic mucous membranes are moist.  No significant JVD. Lungs: Lungs are clear bilaterally no wheezing rales or rhonchi noted. Cardiovascular: Heart regular rate no rub murmur gallop appreciated Abdomen: Soft, nontender, nondistended, no  rebound/rigidity/guarding. Extremities: Distal pulse intact x4.  No significant edema. Neuro: Conscious alert and oriented x3.  Patient does have difficulty hearing but has hearing aids.  Right eyes blind Skin: No rash/contusions/ecchymosis or penetrating injuries  Assessment & Plan:  Relative hypotension Status post diagnostic cerebral angiogram and left carotid angioplasty with stenting. Left ICA severe stenosis CVA COPD CKD stage III  Plan: Patient was given 500 mL bolus of lactated Ringer's in addition to his midodrine 2.5 mg dose.  His blood pressure responded appropriately and has a systolic of 299 currently. Hopefully will be able to avoid vasoactive continuous infusions. Continue serial neurochecks. Monitor I's/O's.  Avoid nephrotoxic medications as able. P.o. diet was resumed post procedure. Patient did require bladder drainage with Foley catheter.  This was performed as in and out procedure.  We will resume outpatient BPH medication.  Best Practice (right click and "Reselect all SmartList Selections" daily)   Diet/type: Regular consistency (see orders) DVT prophylaxis:  GI prophylaxis: PPI Lines: N/A Foley:  N/A Code Status:  full code Last date of multidisciplinary goals of care discussion []   Labs   CBC: Recent Labs  Lab 04/29/21 0902 05/01/21 2018 05/01/21 2039 05/02/21 0204 05/03/21 0346 05/04/21 0119  WBC 7.7 8.5  --  7.8 8.5 8.6  NEUTROABS 5.3 6.0  --   --   --   --   HGB 14.4 14.5 14.6 13.0 13.3 13.2  HCT 44.4 43.6 43.0 39.2 38.8* 40.1  MCV 96.7 93.6  --  94.7 92.8 93.5  PLT 256 255  --  215 236 242  Basic Metabolic Panel: Recent Labs  Lab 04/29/21 0902 05/01/21 2018 05/01/21 2039 05/02/21 0204 05/03/21 0346 05/04/21 0119  NA 138 131* 133* 134* 135 135  K 4.5 4.4 4.4 3.9 4.4 4.0  CL 106 101 100 107 103 105  CO2 24 21*  --  19* 23 20*  GLUCOSE 103* 109* 106* 101* 92 91  BUN 26* 21 24* 19 17 19   CREATININE 1.58* 1.71* 1.80* 1.54* 1.55*  1.65*  CALCIUM 8.7* 8.9  --  8.2* 8.3* 8.5*  MG  --   --   --   --  1.8 1.9   GFR: Estimated Creatinine Clearance: 29.8 mL/min (A) (by C-G formula based on SCr of 1.65 mg/dL (H)). Recent Labs  Lab 05/01/21 2018 05/02/21 0204 05/03/21 0346 05/04/21 0119  WBC 8.5 7.8 8.5 8.6    Liver Function Tests: Recent Labs  Lab 04/29/21 0902 05/01/21 2018  AST 25 25  ALT 14 14  ALKPHOS 75 80  BILITOT 0.9 0.8  PROT 6.4* 6.1*  ALBUMIN 3.6 3.3*   No results for input(s): LIPASE, AMYLASE in the last 168 hours. No results for input(s): AMMONIA in the last 168 hours.  ABG    Component Value Date/Time   TCO2 24 05/01/2021 2039     Coagulation Profile: Recent Labs  Lab 04/29/21 0902 05/01/21 2018  INR 1.1 1.1    Cardiac Enzymes: No results for input(s): CKTOTAL, CKMB, CKMBINDEX, TROPONINI in the last 168 hours.  HbA1C: Hgb A1c MFr Bld  Date/Time Value Ref Range Status  04/30/2021 01:26 AM 5.8 (H) 4.8 - 5.6 % Final    Comment:    (NOTE) Pre diabetes:          5.7%-6.4%  Diabetes:              >6.4%  Glycemic control for   <7.0% adults with diabetes   04/28/2021 04:46 AM 6.0 (H) 4.8 - 5.6 % Final    Comment:    (NOTE) Pre diabetes:          5.7%-6.4%  Diabetes:              >6.4%  Glycemic control for   <7.0% adults with diabetes     CBG: Recent Labs  Lab 04/28/21 0825  GLUCAP 84    Review of Systems:   10 point review of systems was completed.  Currently all were negative.  Past Medical History:  He,  has a past medical history of Bronchitis, chronic ([place]), COPD (chronic obstructive pulmonary disease) ([place]), Enlarged prostate, GERD (gastroesophageal reflux disease), Glaucoma, Glucagonoma, Hernia, incisional, HOH (hard of hearing), IBS (irritable bowel syndrome), Sciatic pain, and Sinus congestion.   Surgical History:   Past Surgical History:  Procedure Laterality Date   BOWEL RESECTION N/A 09/04/2012   Procedure: SMALL BOWEL RESECTION;  Surgeon: Donato Heinz, MD;  Location: [facility];  Service: General;  Laterality: N/A;  Anastimosis   HEMORRHOID SURGERY     HERNIA REPAIR Bilateral 70's   INGUINAL HERNIA REPAIR Left 09/14/2013   Procedure: LEFT INGUINAL HERNIORRHAPHY;  Surgeon: Jamesetta So, MD;  Location: [facility];  Service: General;  Laterality: Left;   INGUINAL HERNIA REPAIR Right 12/23/2018   Procedure: RECURRENT RIGHT INGUINAL HERNIA  REPAIR  WITH MESH;  Surgeon: Aviva Signs, MD;  Location: [facility];  Service: General;  Laterality: Right;   INSERTION OF MESH Left 09/14/2013   Procedure: INSERTION OF MESH;  Surgeon: Jamesetta So, MD;  Location: [facility];  Service: General;  Laterality: Left;   KNEE ARTHROSCOPY Left 07/21/2014   Procedure: LEFT KNEE ARTHROSCOPY WITH MEDIAL MENISCAL DEBRIDEMENT ;  Surgeon: Gearlean Alf, MD;  Location: [facility];  Service: Orthopedics;  Laterality: Left;   LAPAROTOMY N/A 09/04/2012   Procedure: EXPLORATORY LAPAROTOMY;  Surgeon: Donato Heinz, MD;  Location: [facility];  Service: General;  Laterality: N/A;   SKIN LESION EXCISION     [name]   TONSILLECTOMY       Social History:   reports that he quit smoking about 14 years ago. His smoking use included cigarettes. He has a 30.00 pack-year smoking history. He has never used smokeless tobacco. He reports that he does not currently use alcohol. He reports that he does not use drugs.   Family History:  His family history includes Asthma in an other family member; Colon cancer in his father; Healthy in his daughter, daughter, and daughter; Heart disease in his mother.   Allergies Allergies  Allergen Reactions   Demerol [Meperidine] Other (See Comments)    Pt. States "he woke up during a colonoscopy"   Spiriva Handihaler [Tiotropium Bromide Monohydrate] Other (See Comments)    Mild Urinary Retention     Home Medications  Prior to Admission medications   Medication Sig Start Date End Date Taking? Authorizing Provider  aspirin EC 325 MG EC tablet Take 1  tablet (325 mg total) by mouth daily. 04/29/21   Ghimire, Henreitta Leber, MD  atorvastatin (LIPITOR) 40 MG tablet Take 1 tablet (40 mg total) by mouth daily. 04/29/21   Ghimire, Henreitta Leber, MD  azelastine (ASTELIN) 0.1 % nasal spray PLACE 1 SPRAY IN EACH NOSTRIL ONCE A DAY AS DIRECTED 01/17/21   Gottschalk, Ashly M, DO  BREO ELLIPTA 200-25 MCG/INH AEPB Inhale 1 puff into the lungs daily. 01/17/21   [name], DO  Cholecalciferol (VITAMIN D3) 2000 UNITS TABS Take 1 tablet by mouth daily.    [provider]  clopidogrel (PLAVIX) 75 MG tablet Take 1 tablet (75 mg total) by mouth daily. 04/29/21   Ghimire, Henreitta Leber, MD  Cyanocobalamin (VITAMIN B-12) 5000 MCG SUBL Place 1 tablet under the tongue daily.    [provider]  finasteride (PROSCAR) 5 MG tablet Take 5 mg by mouth daily.    [provider]  fluticasone (FLONASE) 50 MCG/ACT nasal spray Place 2 sprays into both nostrils daily.    [provider]  guaiFENesin (MUCINEX) 600 MG 12 hr tablet Take 600 mg by mouth daily as needed for to loosen phlegm.    [provider]  latanoprost (XALATAN) 0.005 % ophthalmic solution Place 1 drop into the left eye at bedtime. Patient taking differently: Place 1 drop into both eyes at bedtime. 06/01/16   Chipper Herb, MD  pantoprazole (PROTONIX) 40 MG tablet Take 1 tablet (40 mg total) by mouth daily. 04/28/21 04/28/22  Ghimire, Henreitta Leber, MD  tamsulosin (FLOMAX) 0.4 MG CAPS capsule Take 0.8 mg by mouth daily. 02/11/19   [provider]  timolol (TIMOPTIC) 0.5 % ophthalmic solution Place 1 drop into the left eye every morning. 11/24/18   [provider]  VENTOLIN HFA 108 (90 Base) MCG/ACT inhaler INHALE 2 PUFFS EVERY 6 HOURS AS NEEDED FOR WHEEZING OR SHORTNESS OF BREATH Patient taking differently: 1 puff every 6 (six) hours as needed for shortness of breath. 02/28/21   [name], DO     Critical care time: 72mins

## 2021-05-05 NOTE — TOC CAGE-AID Note (Signed)
Transition of Care [facility]) - CAGE-AID Screening   Patient Details  Name: Charles Golden MRN: [id number] Date of Birth: 03/06/1926  Transition of [facility]) CM/SW Contact:    Charles Golden, Charles Golden Phone Number: 05/05/2021, 2:43 PM   Clinical Narrative: Pt is unable to participate in Cage Aid.  Pt is not appropriate for assessment.  Charles Golden, MSW, LCSW-A Pronouns:  She/Her/Hers [facility] HealthTransitions of Care Clinical Social Worker Direct Number:  [phone] [email or web address]   CAGE-AID Screening: Substance Abuse Screening unable to be completed due to: : Patient unable to participate (Pt is not appropriate for assessment.)             Substance Abuse Education Offered: No

## 2021-05-05 NOTE — Discharge Instructions (Addendum)
[facility] Stay Proper nutrition can help your body recover from illness and injury.   Foods and beverages high in protein, vitamins, and minerals help rebuild muscle loss, promote healing, & reduce fall risk.   In addition to eating healthy foods, a nutrition shake is an easy, delicious way to get the nutrition you need during and after your hospital stay  It is recommended that you continue to drink 3 bottles per day of:       Ensure Plus or Boost Plus  for at least 1 month (30 days) after your hospital stay. Increase if not eating, can decrease if begins eating more.    Tips for adding a nutrition shake into your routine: As allowed, drink one with vitamins or medications instead of water or juice Enjoy one as a tasty mid-morning or afternoon snack Drink cold or make a milkshake out of it Drink one instead of milk with cereal or snacks Use as a coffee creamer   Available at the following grocery stores and pharmacies:           * [place] [phone]            For COUPONS visit: www.ensure.com/join or [email or web address]   Suggested Substitutions Ensure Plus = Boost Plus = Carnation Breakfast Essentials = Boost Compact Ensure Active Clear = Boost Breeze Glucerna Shake = Boost Glucose Control = Carnation Breakfast Essentials SUGAR FREE    Femoral Site Care This sheet gives you information about how to care for yourself after your procedure. Your health care provider may also give you more specific instructions. If you have problems or questions, contact your health care provider. What can I expect after the procedure? After the procedure, it is common to have: Bruising that usually fades within 1-2 weeks. Tenderness at the site. Follow these instructions at home: Wound care Follow  instructions from your health care provider about how to take care of your insertion site. Make sure you: Wash your hands with soap and water before you change your bandage (dressing). If soap and water are not available, use hand sanitizer. Change your dressing as directed- pressure dressing removed 24 hours post-procedure (and switch for bandaid), bandaid removed 72 hours post-procedure Do not take baths, swim, or use a hot tub for 7 days post-procedure. You may shower 48 hours after the procedure or as told by your health care provider. Gently wash the site with plain soap and water. Pat the area dry with a clean towel. Do not rub the site. This may cause bleeding. Check your site every day for signs of infection. Check for: Redness, swelling, or pain. Fluid or blood. Warmth. Pus or a bad smell. Activity Do not stoop, bend, or lift anything that is heavier than 10 lb (4.5 kg) for 2 weeks post-procedure. Do not drive self for 2 weeks post-procedure. Contact a health care provider if you have: A fever or chills. You have redness, swelling, or pain around your insertion site. Get help right away if: The catheter insertion area swells very fast. You pass out. You suddenly start to sweat or your skin gets clammy. The catheter insertion area is bleeding, and the bleeding does not stop when  you hold steady pressure on the area. The area near or just beyond the catheter insertion site becomes pale, cool, tingly, or numb. These symptoms may represent a serious problem that is an emergency. Do not wait to see if the symptoms will go away. Get medical help right away. Call your local emergency services (911 in the [place] Do not drive yourself to the hospital.  This information is not intended to replace advice given to you by your health care provider. Make sure you discuss any questions you have with your health care provider. Document Revised: 05/27/2017 Document Reviewed: 05/27/2017 Elsevier  Patient Education  2020 [employer].

## 2021-05-05 NOTE — Progress Notes (Signed)
Patient hypotensive. BP 92/62 (62). SBP Goal for first 24 hours 120-140. Neurology paged, verbal order for 500cc bolus and if that did not work contact Neuro IR. Dr. Earleen Newport with IR paged. Will consult CCM.

## 2021-05-05 NOTE — Progress Notes (Addendum)
STROKE TEAM PROGRESS NOTE   INTERVAL HISTORY Patient is resting comfortably in bed.  Daughter is at the bedside.  RN at the bedside. Patient tolerated left ICA angioplasty and stenting yesterday. Patient complains of urinary retention and having to be I/Ox 2. Patient also became hypotensive last pm requiring increased midodrine. Bp has responded well. We have encouraged increased mobilization to help with urinary retention. All questions answered and family voiced understanding  Vital signs stable.  Neurological exam unchanged and nonfocal  Vitals:   05/05/21 0800 05/05/21 0900 05/05/21 0922 05/05/21 1000  BP: 123/63 120/67  127/89  Pulse: (!) 58 77  75  Resp: 13 (!) 23  20  Temp: 98.1 F (36.7 C)     TempSrc: Oral     SpO2: 100% 100% 100% 100%  Weight:      Height:       CBC:  Recent Labs  Lab 04/29/21 0902 05/01/21 2018 05/01/21 2039 05/03/21 0346 05/04/21 0119  WBC 7.7 8.5   < > 8.5 8.6  NEUTROABS 5.3 6.0  --   --   --   HGB 14.4 14.5   < > 13.3 13.2  HCT 44.4 43.6   < > 38.8* 40.1  MCV 96.7 93.6   < > 92.8 93.5  PLT 256 255   < > 236 242   < > = values in this interval not displayed.    Basic Metabolic Panel:  Recent Labs  Lab 05/03/21 0346 05/04/21 0119 05/05/21 0325  NA 135 135 135  K 4.4 4.0 4.3  CL 103 105 103  CO2 23 20* 22  GLUCOSE 92 91 103*  BUN 17 19 17   CREATININE 1.55* 1.65* 1.45*  CALCIUM 8.3* 8.5* 8.0*  MG 1.8 1.9  --     Lipid Panel:  Recent Labs  Lab 04/30/21 0126  CHOL 105  TRIG 64  HDL 42  CHOLHDL 2.5  VLDL 13  LDLCALC 50    HgbA1c:  Recent Labs  Lab 04/30/21 0126  HGBA1C 5.8*    Urine Drug Screen:  Recent Labs  Lab 05/02/21 0055  LABOPIA NONE DETECTED  COCAINSCRNUR NONE DETECTED  LABBENZ NONE DETECTED  AMPHETMU NONE DETECTED  THCU NONE DETECTED  LABBARB NONE DETECTED     Alcohol Level  Recent Labs  Lab 05/01/21 2018  ETH <10     IMAGING past 24 hours No results found.  PHYSICAL EXAM General:  Patient  is pleasant elderly Caucasian male alert, well-developed male in no acute distress  NEURO:  Mental Status: AA&Ox3  Speech/Language: speech is without dysarthria or aphasia.  Repetition, fluency, and comprehension intact.  Cranial Nerves:  II: PERRL. Visual fields full.  III, IV, VI: EOMI. Eyelids elevate symmetrically.  V: Sensation is intact to light touch and symmetrical to face.  VII: Smile is symmetrical.  VIII: hearing intact to voice. IX, X:  Phonation is normal.  XII: tongue is midline without fasciculations. Motor: 5/5 strength to all muscle groups tested.  Sensation- Intact to light touch bilaterally. Extinction absent to light touch to DSS.  Coordination: FTN intact bilaterally Gait- deferred   ASSESSMENT/PLAN Charles Golden is a 87 y.o. male with history of  history of BPH, COP, CKD stage IIIb, and recent stroke on 04/27/2021 who was readmitted earlier this week for repeat symptoms of aphasia.  He was discharged yesterday at 1200 and returned to the ED at 1800 with another episode of aphasia which has since resolved.  He has been  readmitted and will undergo stenting of the left ICA.  Midodrine may be used to prevent hypotension which has led to episodes of aphasia.  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 ASPECTS 10. CTA head & neck Interval - partial recanalization of the cervical left ICA. Remains focal near occlusion with submillimeter lumen and decreased distal vessel caliber reflecting flow limitation. Intracranially, the left ICA is now patent. There is a persistent left M2 MCA occlusion, but the occlusion now occurs more distally within the sylvian fissure. Some distal reconstitution is present.  Perfusion imaging demonstrates no core infarction  and 83 mL of penumbra in the left MCA territory. MRI  Two areas of acute infarction within the left frontal lobe, the larger of which is in the insula. Magnetic susceptibility effect within the left MCA likely indicates a site of thrombus. 2D Echo Normal left ventricular function, EF 60-65% LDL 50 HgbA1c 5.8 VTE prophylaxis - lovenox 40mg     Diet   Diet Heart Room service appropriate? Yes; Fluid consistency: Thin   aspirin 325 mg daily and clopidogrel 75 mg daily prior to admission, now on aspirin 325 mg daily and clopidogrel 75 mg daily. Continue prior medications Follow up with Neurology clinic in 2 months Therapy recommendations: Pending Disposition: pending  Left ICA severe stenosis s/p angioplasty and stent CTA neck 04/29/21 - Interval partial recanalization of the cervical left ICA. Remains focal near occlusion with submillimeter lumen and decreased distal vessel caliber reflecting flow limitation. Intracranially, the left ICA is now patent. There is a persistent left M2 MCA occlusion, but the occlusion now occurs more distally within the sylvian fissure. Some distal reconstitution is present. (ASA and Plavix currently Angioplasty and stenting performed by Dr Debbrah Alar with cerebral protection device with complete resolution of stenosis. Post intervention angiogram showed missing left M3 branch corresponding to occluded branch on prior CTA.    Hypertension History / Hypotension Stable Avoid low BP, long term goal 120-140 post procedure for 24 hours midodrine to prevent further episodes of hypotension  Hyperlipidemia Home meds:  Atorvastatin 40mg , resumed in hospital LDL 50, goal < 70 Continue statin at discharge        Diabetes type II Controlled HgbA1c 5.8, goal < 7.0 CBGs - No results for input(s): GLUCAP in the last 72 hours. SSI        Other Stroke Risk Factors Advanced Age >/= 58  Hx stroke/TIA Migraines- patient does report using Excedrin migraine for headaches        Other Active Problems COPD - Continue home bronchodilator  BPH -> Proscar - (may contribute to hypotension)  CKD IIIb - at baseline function - Monitor renal function - Creatinine - 1.55->1.65 (Normal saline at 75 cc / hour) Monitor urine output Hypotension -> midodrine   Hospital day # 3  Neurology will sign off, please call with questions or concerns   Patient seen and examined by NP/APP with MD. MD to update note as needed.   Beulah Gandy, DNP, ACNPC-AG  STROKE MD NOTE : I have personally obtained history,examined this patient, reviewed notes, independently viewed imaging studies, participated in medical decision making and plan of care.ROS completed by me personally and pertinent positives fully documented  I have made any additions or clarifications directly to the above note. Agree with note  above.  Patient had elective carotid stenting yesterday and seems to be doing well.  He has some urine retention.  Recommend mobilize out of bed and therapy consults.  Long discussion with patient and daughter and answered questions.  Discussed with Dr. Broadus John.  Consider transferring out of the ICU to floor bed.This patient is critically ill and at significant risk of neurological worsening, death and care requires constant monitoring of vital signs, hemodynamics,respiratory and cardiac monitoring, extensive review of multiple databases, frequent neurological assessment, discussion with family, other specialists and medical decision making of high complexity.I have made any additions or clarifications directly to the above note.This critical care time does not reflect procedure time, or teaching time or supervisory time of PA/NP/Med Resident etc but could involve care discussion time.  I spent 30 minutes of neurocritical care time  in the care of  this patient.      Antony Contras, MD Medical Director [facility] Pager: [phone] 05/05/2021 1:09 PM  To contact Stroke Continuity  provider, please refer to [email or web address] After hours, contact General Neurology

## 2021-05-05 NOTE — Progress Notes (Signed)
NAME:  Charles Golden, MRN:  [id number], DOB:  1925/10/08, LOS: 3 ADMISSION DATE:  05/01/2021, CONSULTATION DATE: 05/05/2021. REFERRING MD: Dr. Broadus John, CHIEF COMPLAINT: CVA  History of Present Illness:  This is a 87 year old white male that presented to [facility] for scheduled interventional radiology procedure to remove known left ICA stenosis.  On 05/04/2021 the patient went to IR and had cerebral angiogram and left carotid angioplasty with stenting performed.  He was then transferred to the neuro intensive care unit.  Initially he was hypertensive and required them administration of Cleviprex but after being straight cathed for 700 cc of urine the patient was able to relax and became relatively hypotensive in light of a goal of systolic blood pressure 683-419.  The patient not received his 1800 dose of midodrine.  Currently the patient denies any new physical complaints.  He is difficult of hearing but with a hearing aid in place he was able to answer all questions.  Pertinent  Medical History  COPD CVA CKD stage III BPH Left ICA severe stenosis  Significant Hospital Events: Including procedures, antibiotic start and stop dates in addition to other pertinent events   12/5 admitted to [facility] for persistent aphasia after 12/1 CVA  12/8 L ICA angioplasty / stent placement 12/8 -12/9 post procedure relative hypotension and moved to ICU for tight BP control 120-140. Received IVF did not require pressors     Interval History   Very frustrated that he is having difficulty urinating related to BPH   BP improved now SBP 120s    Objective   Blood pressure 123/63, pulse (!) 58, temperature 98.7 F (37.1 C), temperature source Oral, resp. rate 13, height 5\' 11"  (1.803 m), weight 75.8 kg, SpO2 100 %.        Intake/Output Summary (Last 24 hours) at 05/05/2021 0826 Last data filed at 05/05/2021 0800 Gross per 24 hour  Intake 961.01 ml  Output 1375 ml  Net -413.99 ml   Filed Weights    05/02/21 1147  Weight: 75.8 kg    Examination: General: Elderly M reclined in bed NAD  HENT: NCAT pink mm anicteric sclera  Lungs: CTAb, even unlabored on RA  Cardiovascular: regular. S1s2 cap refill brisk  Abdomen: soft + bowel sounds  Extremities: Chronic arthritic changes of hands and feet. No acute deformity  Neuro: AAOx3 following commands. Hard of hearing. No facial droop, no focal weakness  Skin: c/d/w no rash   Assessment & Plan:   L ICA stenosis s/p stenting Plan: -Goal 120-140 -- currently at goal. Strict goal will end around 1300 12/9  -incr midodrine  -DAPT  -NIR following   BPH Plan: -if SBP can sustain > 120, home dose BPH med -I/O, hopefully won't need foley   Hx COPD Plan: -Breo ellipta, Albuterol  Hx CKD  Plan: -PRN renal indices   Best Practice (right click and "Reselect all SmartList Selections" daily)   Diet/type: Regular consistency (see orders) DVT prophylaxis:  GI prophylaxis: PPI Lines: N/A Foley:  N/A Code Status:  full code Last date of multidisciplinary goals of care discussion [12/9 pt and daughter at bedside] Dispo: Anticipate likely will be able to transfer back out of the ICU today. Will ask TRH to resume care if SBP remains in goal range this morning   Labs   CBC: Recent Labs  Lab 04/29/21 0902 05/01/21 2018 05/01/21 2039 05/02/21 0204 05/03/21 0346 05/04/21 0119  WBC 7.7 8.5  --  7.8 8.5 8.6  NEUTROABS 5.3  6.0  --   --   --   --   HGB 14.4 14.5 14.6 13.0 13.3 13.2  HCT 44.4 43.6 43.0 39.2 38.8* 40.1  MCV 96.7 93.6  --  94.7 92.8 93.5  PLT 256 255  --  215 236 950    Basic Metabolic Panel: Recent Labs  Lab 05/01/21 2018 05/01/21 2039 05/02/21 0204 05/03/21 0346 05/04/21 0119 05/05/21 0325  NA 131* 133* 134* 135 135 135  K 4.4 4.4 3.9 4.4 4.0 4.3  CL 101 100 107 103 105 103  CO2 21*  --  19* 23 20* 22  GLUCOSE 109* 106* 101* 92 91 103*  BUN 21 24* 19 17 19 17   CREATININE 1.71* 1.80* 1.54* 1.55* 1.65* 1.45*   CALCIUM 8.9  --  8.2* 8.3* 8.5* 8.0*  MG  --   --   --  1.8 1.9  --    GFR: Estimated Creatinine Clearance: 33.9 mL/min (A) (by C-G formula based on SCr of 1.45 mg/dL (H)). Recent Labs  Lab 05/01/21 2018 05/02/21 0204 05/03/21 0346 05/04/21 0119  WBC 8.5 7.8 8.5 8.6    Liver Function Tests: Recent Labs  Lab 04/29/21 0902 05/01/21 2018  AST 25 25  ALT 14 14  ALKPHOS 75 80  BILITOT 0.9 0.8  PROT 6.4* 6.1*  ALBUMIN 3.6 3.3*   No results for input(s): LIPASE, AMYLASE in the last 168 hours. No results for input(s): AMMONIA in the last 168 hours.  ABG    Component Value Date/Time   TCO2 24 05/01/2021 2039     Coagulation Profile: Recent Labs  Lab 04/29/21 0902 05/01/21 2018  INR 1.1 1.1    Cardiac Enzymes: No results for input(s): CKTOTAL, CKMB, CKMBINDEX, TROPONINI in the last 168 hours.  HbA1C: Hgb A1c MFr Bld  Date/Time Value Ref Range Status  04/30/2021 01:26 AM 5.8 (H) 4.8 - 5.6 % Final    Comment:    (NOTE) Pre diabetes:          5.7%-6.4%  Diabetes:              >6.4%  Glycemic control for   <7.0% adults with diabetes   04/28/2021 04:46 AM 6.0 (H) 4.8 - 5.6 % Final    Comment:    (NOTE) Pre diabetes:          5.7%-6.4%  Diabetes:              >6.4%  Glycemic control for   <7.0% adults with diabetes     CBG: No results for input(s): GLUCAP in the last 168 hours.  CCT: n/a    Eliseo Gum MSN, AGACNP-BC [place] for pager 05/05/2021, 9:07 AM

## 2021-05-06 DIAGNOSIS — I63512 Cerebral infarction due to unspecified occlusion or stenosis of left middle cerebral artery: Secondary | ICD-10-CM

## 2021-05-06 LAB — BASIC METABOLIC PANEL
Anion gap: 8 (ref 5–15)
BUN: 14 mg/dL (ref 8–23)
CO2: 22 mmol/L (ref 22–32)
Calcium: 8.2 mg/dL — ABNORMAL LOW (ref 8.9–10.3)
Chloride: 104 mmol/L (ref 98–111)
Creatinine, Ser: 1.62 mg/dL — ABNORMAL HIGH (ref 0.61–1.24)
GFR, Estimated: 39 mL/min — ABNORMAL LOW (ref >60)
Glucose, Bld: 120 mg/dL — ABNORMAL HIGH (ref 70–99)
Potassium: 3.9 mmol/L (ref 3.5–5.1)
Sodium: 134 mmol/L — ABNORMAL LOW (ref 135–145)

## 2021-05-06 MED ORDER — TAMSULOSIN HCL 0.4 MG PO CAPS: 0.4000 mg | ORAL_CAPSULE | Freq: Every day | ORAL | Status: DC

## 2021-05-06 MED ORDER — MIDODRINE HCL 5 MG PO TABS: 5.0000 mg | ORAL_TABLET | Freq: Two times a day (BID) | ORAL | 0 refills | Status: AC

## 2021-05-06 MED ORDER — ACETAMINOPHEN 325 MG PO TABS
650.0000 mg | ORAL_TABLET | ORAL | Status: DC | PRN
Start: 2021-05-06 — End: 2021-08-03

## 2021-05-06 MED ORDER — MIDODRINE HCL 5 MG PO TABS: 5.0000 mg | ORAL_TABLET | Freq: Two times a day (BID) | ORAL | 0 refills | Status: DC

## 2021-05-06 NOTE — TOC Transition Note (Signed)
Transition of Care [facility]) - CM/SW Discharge Note   Patient Details  Name: Charles Golden MRN: [id number] Date of Birth: [birth year]  Transition of Care [facility]) CM/SW Contact:  Bartholomew Crews, RN Phone Number: [phone] 05/06/2021, 11:14 AM   Clinical Narrative:     Spoke with nursing on the phone to discuss plans for transition home with [facility]. RN was with daughter, Charles Golden. Choice of [place] agency - [facility]. Referral sent. HH PT Face to Face in place. No further TOC needs identified.   Final next level of care: [place] Barriers to Discharge: No Barriers Identified   Patient Goals and CMS Choice Patient states their goals for this hospitalization and ongoing recovery are:: home with family support CMS Medicare.gov Compare Post Acute Care list provided to:: Patient Represenative (must comment) (daughter, Charles Golden) Choice offered to / list presented to : Adult Children  Discharge Placement                       Discharge Plan and Services   Discharge Planning Services: CM Consult Post Acute Care Choice: Home Health          DME Arranged: N/A DME Agency: NA       HH Arranged: PT HH Agency: [place] Date [facility] Agency Contacted: 05/06/21 Time [place]: 1114 Representative spoke with at [place]: [place] Determinants of Health ([place]) Interventions     Readmission Risk Interventions No flowsheet data found.

## 2021-05-06 NOTE — Progress Notes (Addendum)
STROKE TEAM PROGRESS NOTE   INTERVAL HISTORY Patient is seen in his room with his daughter at the bedside.  He has had no acute events overnight and reports that his urinary retention has improved.  He has been hemodynamically stable, and his neurological exam is unchanged.  Vitals:   05/06/21 0800 05/06/21 0830 05/06/21 0926 05/06/21 0930  BP: 137/73 125/73  138/72  Pulse: 66 65 63 64  Resp:   20 18  Temp: 98.4 F (36.9 C)     TempSrc: Axillary     SpO2: 98% 98% 97% (!) 82%  Weight:      Height:       CBC:  Recent Labs  Lab 05/01/21 2018 05/01/21 2039 05/03/21 0346 05/04/21 0119  WBC 8.5   < > 8.5 8.6  NEUTROABS 6.0  --   --   --   HGB 14.5   < > 13.3 13.2  HCT 43.6   < > 38.8* 40.1  MCV 93.6   < > 92.8 93.5  PLT 255   < > 236 242   < > = values in this interval not displayed.    Basic Metabolic Panel:  Recent Labs  Lab 05/03/21 0346 05/04/21 0119 05/05/21 0325 05/06/21 0337  NA 135 135 135 134*  K 4.4 4.0 4.3 3.9  CL 103 105 103 104  CO2 23 20* 22 22  GLUCOSE 92 91 103* 120*  BUN 17 19 17 14   CREATININE 1.55* 1.65* 1.45* 1.62*  CALCIUM 8.3* 8.5* 8.0* 8.2*  MG 1.8 1.9  --   --     Lipid Panel:  Recent Labs  Lab 04/30/21 0126  CHOL 105  TRIG 64  HDL 42  CHOLHDL 2.5  VLDL 13  LDLCALC 50    HgbA1c:  Recent Labs  Lab 04/30/21 0126  HGBA1C 5.8*    Urine Drug Screen:  Recent Labs  Lab 05/02/21 0055  LABOPIA NONE DETECTED  COCAINSCRNUR NONE DETECTED  LABBENZ NONE DETECTED  AMPHETMU NONE DETECTED  THCU NONE DETECTED  LABBARB NONE DETECTED     Alcohol Level  Recent Labs  Lab 05/01/21 2018  ETH <10     IMAGING past 24 hours No results found.  PHYSICAL EXAM General:  Patient is pleasant elderly Caucasian male alert, well-developed male in no acute distress  NEURO:  Mental Status: AA&Ox3  Speech/Language: speech is without dysarthria or aphasia.  Repetition, fluency, and comprehension intact.  Cranial Nerves:  II: PERRL. Visual  fields full.  III, IV, VI: EOMI. Eyelids elevate symmetrically.  V: Sensation is intact to light touch and symmetrical to face.  VII: Smile is symmetrical.  VIII: hearing intact to voice. IX, X:  Phonation is normal.  XII: tongue is midline without fasciculations. Motor: 5/5 strength to all muscle groups tested.  Sensation- Intact to light touch bilaterally. Extinction absent to light touch to DSS.  Coordination: FTN intact bilaterally Gait- deferred   ASSESSMENT/PLAN Charles Golden is a 87 y.o. male with history of  history of BPH, COPD, CKD stage IIIb, and recent stroke on 04/27/2021 who was readmitted earlier this week for repeat symptoms of aphasia.  He was discharged 12/7 at 1200 and returned to the ED at 1800 with another episode of aphasia which has since resolved.  He has been readmitted and will undergo stenting of the left ICA.  Midodrine may be used to prevent hypotension which has led to episodes of aphasia.  Stenting of left ICA has been completed.  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 ASPECTS 10. CTA head & neck Interval - partial recanalization of the cervical left ICA. Remains focal near occlusion with submillimeter lumen and decreased distal vessel caliber reflecting flow limitation. Intracranially, the left ICA is now patent. There is a persistent left M2 MCA occlusion, but the occlusion now occurs more distally within the sylvian fissure. Some distal reconstitution is present.  Perfusion imaging demonstrates no core infarction and 83 mL of penumbra in the left MCA territory. MRI  Two areas of acute infarction within the left frontal lobe, the larger of which is in the insula. Magnetic susceptibility effect within the left MCA likely indicates a  site of thrombus. 2D Echo Normal left ventricular function, EF 60-65% LDL 50 HgbA1c 5.8 VTE prophylaxis - lovenox 40mg     Diet   Diet regular Room service appropriate? Yes; Fluid consistency: Thin   aspirin 325 mg daily and clopidogrel 75 mg daily prior to admission, now on aspirin 325 mg daily and clopidogrel 75 mg daily. Continue prior medications Follow up with Neurology clinic in 2 months Therapy recommendations: Pending Disposition: pending  Left ICA severe stenosis s/p angioplasty and stent CTA neck 04/29/21 - Interval partial recanalization of the cervical left ICA. Remains focal near occlusion with submillimeter lumen and decreased distal vessel caliber reflecting flow limitation. Intracranially, the left ICA is now patent. There is a persistent left M2 MCA occlusion, but the occlusion now occurs more distally within the sylvian fissure. Some distal reconstitution is present. (ASA and Plavix currently Angioplasty and stenting performed by Dr Debbrah Alar with cerebral protection device with complete resolution of stenosis. Post intervention angiogram showed missing left M3 branch corresponding to occluded branch on prior CTA.    Hypertension History / Hypotension Stable Avoid low BP, long term goal 120-140 post procedure for 24 hours midodrine to prevent further episodes of hypotension  Hyperlipidemia Home meds:  Atorvastatin 40mg , resumed in hospital LDL 50, goal < 70 Continue statin at discharge        Diabetes type II Controlled HgbA1c 5.8, goal < 7.0 CBGs - No results for input(s): GLUCAP in the last 72 hours. SSI        Other Stroke Risk Factors Advanced Age >/= 5  Hx stroke/TIA Migraines- patient does report using Excedrin migraine for headaches       Other Active Problems COPD - Continue home bronchodilator  BPH -> Proscar - (may contribute to hypotension)  CKD IIIb - at baseline function - Monitor renal function - Creatinine - 1.55->1.65 (Normal saline at 75 cc  / hour) Monitor urine output Hypotension -> midodrine   Hospital day # 4  Neurology will sign off, please call with questions or concerns   [place] , MSN, AGACNP-BC [facility] See Amion for schedule and pager information 05/06/2021 11:04 AM    ATTENDING NOTE: I reviewed above note and agree with the assessment and plan. Pt was seen and examined.   Patient RN and daughter at bedside.  Patient sitting in bed, awake alert, orientated x3, no focal deficit.  Status post left ICA stenting, doing well.  BP stabilized, still on midodrine.  Urinary retention resolved, on Flomax and Proscar.  Okay to DC from neuro standpoint, continue DAPT and statin.  Follow-up with  Dr. Norma Fredrickson and Dr. Jaynee Eagles as outpatient.  For detailed assessment and plan, please refer to above as I have made changes wherever appropriate.   Neurology will sign off. Please call with questions. Pt will follow up with Dr. Jaynee Eagles At [facility] on 06/22/2021. Thanks for the consult.   Rosalin Hawking, MD PhD Stroke Neurology 05/06/2021 5:13 PM    To contact Stroke Continuity provider, please refer to [email or web address] After hours, contact General Neurology

## 2021-05-06 NOTE — Discharge Summary (Signed)
Physician Discharge Summary  Charles Golden FIE:[id number] DOB: June 04, 1925 DOA: 05/01/2021  PCP: [name], DO  Admit date: 05/01/2021 Discharge date: 05/06/2021  Time spent: 35 minutes  Recommendations for Outpatient Follow-up:  PCP in 1 week, can wean off midodrine and increase Flomax/tamsulosin up to 0.  8 mg daily if blood pressure is stable Home health PT Urology Dr. Jeffie Pollock in 1 to 2 weeks Interventional radiology Dr. Ladean Raya in 3 months   Discharge Diagnoses:  Principal Problem:   CVA (cerebral vascular accident) ([place]) Occluded left ICA Hypotension   BPH (benign prostatic hyperplasia)   COPD GOLD I    GERD without esophagitis   Protein-calorie malnutrition, severe   Discharge Condition: Stable  Diet recommendation: Heart healthy  Filed Weights   05/02/21 1147  Weight: 75.8 kg    History of present illness:  Charles Golden is a 87 y.o. male with medical history significant of hearing loss, BPH, bronchiectasis, COPD, GERD, IBS, RLS, CKD presenting after continued intermittent episodes of aphasia and dysarthria. Patient initially presented on 12/1 for acute stroke secondary to left ICA and M2 occlusion. Patient had stroke work-up at that time and also was readmitted multiple times since. Symptoms had continued to return intermittently, thus he was brought back to the ER.   Hospital Course:   Recurrent left CVA Left ICA stenosis > Patient with recurrent admissions for intermittent stroke symptoms of aphasia and dysarthria. > Initially presented on 12/1 found to have occluded left ICA and M2.  With left insular infarct.  Plan was for outpatient neuro interventional radiology with stenting.  then back w/ recurrent symptoms - evaluated by neuro and IR; underwent left carotid angioplasty and stenting on 12/8 by Dr. Janean Sark from IR - maintained on aspirin Plavix and statin post procedure [facility] course complicated by hypotension, started on  low-dose midodrine, Flomax dose decreased to 0.4 mg daily -Discharged in a stable condition, importance of dual antiplatelet therapy emphasized, follow-up with IR in 3 months, home health PT set up at follow-up  Hypotension -In the setting of stroke, carotid artery stent, -Started on low-dose midodrine to avoid hypotension post stenting -Flomax dose decreased -Discharged home on low-dose midodrine which he may need for 1 to 2 weeks  BPH, urinary retention -Remains on Flomax 0.4 mg daily, lower dose- to avoid hypotension in above setting -Continue finasteride -Required in and out catheterization after his stenting however subsequently was able to urinate without a catheter -Advised follow-up with Dr. Jeffie Pollock   COPD -Stable - Continue home Breo and as needed albuterol   GERD - Protonix   DMII - continue diet control - A1c 5.8% on 04/30/21   Chronic constipation -Senokot, MiraLAX PRN  Procedures: Diagnostic cerebral angiogram with left carotid artery angioplasty and stenting with cerebral protection device Dr. Sindy Messing   Consultations: Neuro interventional radiology, urology  Discharge Exam: Vitals:   05/06/21 0926 05/06/21 0930  BP:  138/72  Pulse: 63 64  Resp: 20 18  Temp:    SpO2: 97% (!) 82%   Gen: Awake, Alert, Oriented X 3, hard of hearing, no distress HEENT: no JVD Lungs: Good air movement bilaterally, CTAB CVS: S1S2/RRR Abd: soft, Non tender, non distended, BS present Extremities: No edema Neuro: Moves all extremities, no localizing signs Skin: no new rashes on exposed skin   Discharge Instructions   Discharge Instructions     Diet - low sodium heart healthy   Complete by: As directed    Increase activity  slowly   Complete by: As directed    No wound care   Complete by: As directed       Allergies as of 05/06/2021       Reactions   Demerol [meperidine] Other (See Comments)   Pt. States "he woke up during a colonoscopy"   Spiriva  Handihaler [tiotropium Bromide Monohydrate] Other (See Comments)   Mild Urinary Retention        Medication List     TAKE these medications    acetaminophen 325 MG tablet Commonly known as: TYLENOL Take 2 tablets (650 mg total) by mouth every 4 (four) hours as needed for mild pain (or temp > 37.5 C (99.5 F)).   aspirin 325 MG EC tablet Take 1 tablet (325 mg total) by mouth daily.   atorvastatin 40 MG tablet Commonly known as: LIPITOR Take 1 tablet (40 mg total) by mouth daily.   azelastine 0.1 % nasal spray Commonly known as: ASTELIN PLACE 1 SPRAY IN EACH NOSTRIL ONCE A DAY AS DIRECTED   Breo Ellipta 200-25 MCG/ACT Aepb Generic drug: fluticasone furoate-vilanterol Inhale 1 puff into the lungs daily.   clopidogrel 75 MG tablet Commonly known as: PLAVIX Take 1 tablet (75 mg total) by mouth daily.   finasteride 5 MG tablet Commonly known as: PROSCAR Take 5 mg by mouth daily.   fluticasone 50 MCG/ACT nasal spray Commonly known as: FLONASE Place 2 sprays into both nostrils daily.   guaiFENesin 600 MG 12 hr tablet Commonly known as: MUCINEX Take 600 mg by mouth daily as needed for to loosen phlegm.   latanoprost 0.005 % ophthalmic solution Commonly known as: XALATAN Place 1 drop into the left eye at bedtime. What changed: how to take this   midodrine 5 MG tablet Commonly known as: PROAMATINE Take 1 tablet (5 mg total) by mouth 2 (two) times daily with a meal for 7 days.   pantoprazole 40 MG tablet Commonly known as: Protonix Take 1 tablet (40 mg total) by mouth daily.   tamsulosin 0.4 MG Caps capsule Commonly known as: FLOMAX Take 1 capsule (0.4 mg total) by mouth daily. Take 0.82m daily for 1 week then increase to 0.849mdaily thereafter if BP is stable/normal What changed:  how much to take additional instructions   timolol 0.5 % ophthalmic solution Commonly known as: TIMOPTIC Place 1 drop into the left eye every morning.   Ventolin HFA 108 (90 Base)  MCG/ACT inhaler Generic drug: albuterol INHALE 2 PUFFS EVERY 6 HOURS AS NEEDED FOR WHEEZING OR SHORTNESS OF BREATH What changed: See the new instructions.   Vitamin B-12 5000 MCG Subl Place 1 tablet under the tongue daily.   Vitamin D3 50 MCG (2000 UT) Tabs Take 1 tablet by mouth daily.       Allergies  Allergen Reactions   Demerol [Meperidine] Other (See Comments)    Pt. States "he woke up during a colonoscopy"   Spiriva Handihaler [Tiotropium Bromide Monohydrate] Other (See Comments)    Mild Urinary Retention    Follow-up Information     de MaRosario JacksMD Follow up.   Specialties: Radiology, Interventional Radiology Why: NIR schedulers will call you with appointment date/time (about 3 months after your procedure) please call ([phone] ([phone] information: [id number]. Schedule an appointment as soon as possible for a visit in 1 week(s).   Specialty:  Family Medicine Contact information: [place] [address] [phone]         Irine Seal, MD. Call in 10 day(s).   Specialty: Urology Contact information: [place] [address] [phone]         Care, [facility] Follow up.   Specialty: [place] Why: the office will call to schedule home health visits Contact information: [place] [address] [phone]                  The results of significant diagnostics from this hospitalization (including imaging, microbiology, ancillary and laboratory) are listed below for reference.    Significant Diagnostic Studies: MR BRAIN WO CONTRAST  Result Date: 04/29/2021 CLINICAL DATA:  Acute neurologic deficit EXAM: MRI HEAD WITHOUT CONTRAST TECHNIQUE: Multiplanar, multiecho pulse sequences of the brain and surrounding structures were obtained without  intravenous contrast. COMPARISON:  None. FINDINGS: Brain: There are 2 areas of acute infarction within the left frontal lobe, the larger of which is in the insula. No acute hemorrhage. Normal white matter signal. Generalized volume loss without a clear lobar predilection. Focus of chronic microhemorrhage in the right cerebellum the midline structures are normal. Vascular: Magnetic susceptibility effect within the left MCA likely indicates a site of thrombus. Skull and upper cervical spine: Normal calvarium and skull base. Visualized upper cervical spine and soft tissues are normal. Sinuses/Orbits:No paranasal sinus fluid levels or advanced mucosal thickening. No mastoid or middle ear effusion. Normal orbits. IMPRESSION: 1. Two areas of acute infarction within the left frontal lobe, the larger of which is in the insula. No hemorrhage or mass effect. 2. Magnetic susceptibility effect within the left MCA likely indicates a site of thrombus. Electronically Signed   By: Ulyses Jarred M.D.   On: 04/29/2021 19:16   MR BRAIN WO CONTRAST  Result Date: 04/28/2021 CLINICAL DATA:  Acute neurologic deficit EXAM: MRI HEAD WITHOUT CONTRAST TECHNIQUE: Multiplanar, multiecho pulse sequences of the brain and surrounding structures were obtained without intravenous contrast. COMPARISON:  None. FINDINGS: Brain: Small focus of abnormal diffusion restriction in the left insula. No other diffusion abnormality. Focus of chronic microhemorrhage in the right cerebellum . Normal white matter signal. Generalized volume loss without a clear lobar predilection. The midline structures are normal. Vascular: Occluded left ICA.  The other flow voids are normal. Skull and upper cervical spine: Normal calvarium and skull base. Visualized upper cervical spine and soft tissues are normal. Sinuses/Orbits:No paranasal sinus fluid levels or advanced mucosal thickening. No mastoid or middle ear effusion. Normal orbits. IMPRESSION: 1. Small focus of  acute ischemia in the left insula. No hemorrhage or mass effect. 2. Occluded left ICA. Electronically Signed   By: Ulyses Jarred M.D.   On: 04/28/2021 01:50   IR INTRAVSC STENT CERV CAROTID W/EMB-PROT MOD SED  Result Date: 05/05/2021 INDICATION: ULYS FAVIA is a 88 y.o. male with a past medical history significant for GERD, COPD, CKD III who presented to [facility] ED on 12/1 with complaints of word finding difficulties/unsteady gait. He was found to have a left ICA occlusion and no intervention was recommended as his deficits had resolved. He was discharged on 12/2 with instructions to begin Plavix and ASA, unfortunately he began to experience similar symptoms and returned to the ED on 12/3. He was again admitted and due to the presence of thrombus in the left ICA and resolution of symptoms decision was made to schedule like carotid angioplasty  and stenting within a week. Unfortunately he began to experience slurred speech again about 6 hours later and he was brought back to the ED. Code stroke was activated and no acute abnormality was seen on imaging, he was found to be hypotensive for which he was given a fluid bolus and this improved his slurred speech. He comes to our service today for diagnostic cerebral angiogram left carotid stenting with cerebral protection device. EXAM: ULTRASOUND-GUIDED VASCULAR ACCESS DIAGNOSTIC CEREBRAL ANGIOGRAM LEFT INTERNAL CAROTID ARTERY ANGIOPLASTY AND STENTING WITH CEREBRAL PROTECTION DEVICE COMPARISON:  CT/CT angiogram of the head and neck April 29, 2021. MEDICATIONS: Refer to anesthesia documentation. ANESTHESIA/SEDATION: The procedure was performed under monitored anesthesia care (MAC). CONTRAST:  100 mL of Omnipaque 300 milligram/mL FLUOROSCOPY TIME:  Fluoroscopy Time: 22 minutes 18 seconds (393 mGy). COMPLICATIONS: None immediate. TECHNIQUE: Informed written consent was obtained from the patient after a thorough discussion of the procedural risks, benefits and alternatives.  All questions were addressed. Maximal Sterile Barrier Technique was utilized including caps, mask, sterile gowns, sterile gloves, sterile drape, hand hygiene and skin antiseptic. A timeout was performed prior to the initiation of the procedure. The right groin was prepped and draped in the usual sterile fashion. Using a micropuncture kit and the modified Seldinger technique, access was gained to the right common femoral artery and an 8 French sheath was placed. Real-time ultrasound guidance was utilized for vascular access including the acquisition of a permanent ultrasound image documenting patency of the accessed vessel. Under fluoroscopy, an 8 [place] Walrus balloon guide catheter was navigated over a 6 [place] Berenstein 2 catheter and a 0.035" Terumo Glidewire into the aortic arch. The catheter was placed into the left common carotid artery. Frontal and lateral angiograms of the neck were obtained followed by frontal and lateral angiograms of the head. FINDINGS: 1. Atherosclerotic changes of the right common femoral artery with calcified plaque resulting in mild stenosis. Vessel diameter is adequate for vascular access. 2. Severe stenosis of the left internal carotid artery at the distal aspect of the carotid bulb with near occlusion (99% stenosis). 3. There is delayed anterograde flow with minimal opacification of the left MCA vascular tree with occlusion of a left M3/MCA posterior division branch, corresponding to occluded branch on prior CT angiogram. 4. No contrast opacification of the right ACA vascular tree. PROCEDURE: Frontal and lateral angiograms of the neck were obtained and utilized as biplane roadmap. Then, and Emboshield NAV 6 cerebral protection device was navigated into the distal cervical segment of the left ICA. Then, left ICA angioplasty was performed with a 4 x 30 mm Viatrac balloon under fluoroscopy while the walrus balloon was inflated. Next, a 6-8 x 40 mm Xact carotid stent was deployed pain  in the distal left common carotid artery and carotid bulb while the walrus balloon was inflated. Subsequently, the 4 x 30 mm Viatrac balloon was navigated in the recently deployed stent. In stent angioplasty was performed under fluoroscopy. Then, the cerebral protection device was recaptured. Left ICA angiograms with frontal lateral views of the neck showed brisk anterograde flow with complete resolution of stenosis. Frontal and lateral angiograms of the head showed improved contrast opacification of the left MCA vascular tree with new opacification of the left ACA vascular tree. Delayed left ICA angiograms with frontal and lateral views of the neck showed no evidence of in stent clot formation. The catheter was subsequently withdrawn. A right common femoral artery angiogram was obtained in right anterior oblique view. The sheath was exchanged  over the wire for a Perclose pro style. Attempted femoral closure with the was Perclose unsuccessful. The device was then exchanged over the wire for an 8 [place] Angio-Seal which was utilized for access closure. Adequate hemostasis was achieved. IMPRESSION: Successful and uncomplicated cervical left internal carotid artery angioplasty and stenting with cerebral protection device. No evidence of thromboembolic complication. PLAN: Continue on dual anti-platelet therapy with aspirin and Plavix. Carotid duplex in 3 months to evaluate for stent patency. Electronically Signed   By: Pedro Earls M.D.   On: 05/05/2021 16:08   IR US Guide Vasc Access Right  Result Date: 05/05/2021 INDICATION: IZZAK FRIES is a 87 y.o. male with a past medical history significant for GERD, COPD, CKD III who presented to [facility] ED on 12/1 with complaints of word finding difficulties/unsteady gait. He was found to have a left ICA occlusion and no intervention was recommended as his deficits had resolved. He was discharged on 12/2 with instructions to begin Plavix and ASA, unfortunately  he began to experience similar symptoms and returned to the ED on 12/3. He was again admitted and due to the presence of thrombus in the left ICA and resolution of symptoms decision was made to schedule like carotid angioplasty and stenting within a week. Unfortunately he began to experience slurred speech again about 6 hours later and he was brought back to the ED. Code stroke was activated and no acute abnormality was seen on imaging, he was found to be hypotensive for which he was given a fluid bolus and this improved his slurred speech. He comes to our service today for diagnostic cerebral angiogram left carotid stenting with cerebral protection device. EXAM: ULTRASOUND-GUIDED VASCULAR ACCESS DIAGNOSTIC CEREBRAL ANGIOGRAM LEFT INTERNAL CAROTID ARTERY ANGIOPLASTY AND STENTING WITH CEREBRAL PROTECTION DEVICE COMPARISON:  CT/CT angiogram of the head and neck April 29, 2021. MEDICATIONS: Refer to anesthesia documentation. ANESTHESIA/SEDATION: The procedure was performed under monitored anesthesia care (MAC). CONTRAST:  100 mL of Omnipaque 300 milligram/mL FLUOROSCOPY TIME:  Fluoroscopy Time: 22 minutes 18 seconds (393 mGy). COMPLICATIONS: None immediate. TECHNIQUE: Informed written consent was obtained from the patient after a thorough discussion of the procedural risks, benefits and alternatives. All questions were addressed. Maximal Sterile Barrier Technique was utilized including caps, mask, sterile gowns, sterile gloves, sterile drape, hand hygiene and skin antiseptic. A timeout was performed prior to the initiation of the procedure. The right groin was prepped and draped in the usual sterile fashion. Using a micropuncture kit and the modified Seldinger technique, access was gained to the right common femoral artery and an 8 French sheath was placed. Real-time ultrasound guidance was utilized for vascular access including the acquisition of a permanent ultrasound image documenting patency of the accessed  vessel. Under fluoroscopy, an 8 [place] Walrus balloon guide catheter was navigated over a 6 [place] Berenstein 2 catheter and a 0.035" Terumo Glidewire into the aortic arch. The catheter was placed into the left common carotid artery. Frontal and lateral angiograms of the neck were obtained followed by frontal and lateral angiograms of the head. FINDINGS: 1. Atherosclerotic changes of the right common femoral artery with calcified plaque resulting in mild stenosis. Vessel diameter is adequate for vascular access. 2. Severe stenosis of the left internal carotid artery at the distal aspect of the carotid bulb with near occlusion (99% stenosis). 3. There is delayed anterograde flow with minimal opacification of the left MCA vascular tree with occlusion of a left M3/MCA posterior division branch, corresponding to occluded branch  on prior CT angiogram. 4. No contrast opacification of the right ACA vascular tree. PROCEDURE: Frontal and lateral angiograms of the neck were obtained and utilized as biplane roadmap. Then, and Emboshield NAV 6 cerebral protection device was navigated into the distal cervical segment of the left ICA. Then, left ICA angioplasty was performed with a 4 x 30 mm Viatrac balloon under fluoroscopy while the walrus balloon was inflated. Next, a 6-8 x 40 mm Xact carotid stent was deployed pain in the distal left common carotid artery and carotid bulb while the walrus balloon was inflated. Subsequently, the 4 x 30 mm Viatrac balloon was navigated in the recently deployed stent. In stent angioplasty was performed under fluoroscopy. Then, the cerebral protection device was recaptured. Left ICA angiograms with frontal lateral views of the neck showed brisk anterograde flow with complete resolution of stenosis. Frontal and lateral angiograms of the head showed improved contrast opacification of the left MCA vascular tree with new opacification of the left ACA vascular tree. Delayed left ICA angiograms with  frontal and lateral views of the neck showed no evidence of in stent clot formation. The catheter was subsequently withdrawn. A right common femoral artery angiogram was obtained in right anterior oblique view. The sheath was exchanged over the wire for a Perclose pro style. Attempted femoral closure with the was Perclose unsuccessful. The device was then exchanged over the wire for an 8 [place] Angio-Seal which was utilized for access closure. Adequate hemostasis was achieved. IMPRESSION: Successful and uncomplicated cervical left internal carotid artery angioplasty and stenting with cerebral protection device. No evidence of thromboembolic complication. PLAN: Continue on dual anti-platelet therapy with aspirin and Plavix. Carotid duplex in 3 months to evaluate for stent patency. Electronically Signed   By: Pedro Earls M.D.   On: 05/05/2021 16:08   ECHOCARDIOGRAM COMPLETE  Result Date: 04/28/2021    ECHOCARDIOGRAM REPORT   Patient Name:   JAWAAN ADACHI Date of Exam: 04/28/2021 Medical Rec #:  [id number]     Height:       70.0 in Accession #:    [id number]    Weight:       170.0 lb Date of Birth:  1925/07/04     BSA:          1.948 m Patient Age:    53 years      BP:           158/79 mmHg Patient Gender: M             HR:           63 bpm. Exam Location:  Inpatient Procedure: 2D Echo, Color Doppler and Cardiac Doppler Indications:    Stroke i63.9  History:        Patient has no prior history of Echocardiogram examinations.                 COPD.  Sonographer:    Raquel Sarna Senior RDCS Referring Phys: [id number] Candace Gallus MELVIN  Sonographer Comments: Technically difficult due to poor echo windows, repositioned at end of study for wall motion. IMPRESSIONS  1. Technically difficult study with limited views. Left ventricular ejection fraction, by estimation, is 60 to 65%. The left ventricle has normal function. The left ventricle has no regional wall motion abnormalities. Left ventricular diastolic  parameters are consistent with Grade II diastolic dysfunction (pseudonormalization). Elevated left atrial pressure.  2. Right ventricle is poorly visualized but appears grossly normal systolic function and mild to moderately dilated  3. Left atrial size was mildly dilated.  4. The mitral valve is normal in structure. No evidence of mitral valve regurgitation.  5. The aortic valve was not well visualized. Aortic valve regurgitation is not visualized. No aortic stenosis is present. FINDINGS  Left Ventricle: Left ventricular ejection fraction, by estimation, is 60 to 65%. The left ventricle has normal function. The left ventricle has no regional wall motion abnormalities. The left ventricular internal cavity size was normal in size. There is  no left ventricular hypertrophy. Left ventricular diastolic parameters are consistent with Grade II diastolic dysfunction (pseudonormalization). Elevated left atrial pressure. Right Ventricle: The right ventricular size is moderately enlarged. Right vetricular wall thickness was not well visualized. Right ventricular systolic function is normal. Left Atrium: Left atrial size was mildly dilated. Right Atrium: Right atrial size was normal in size. Pericardium: There is no evidence of pericardial effusion. Mitral Valve: The mitral valve is normal in structure. No evidence of mitral valve regurgitation. Tricuspid Valve: The tricuspid valve is normal in structure. Tricuspid valve regurgitation is trivial. Aortic Valve: The aortic valve was not well visualized. Aortic valve regurgitation is not visualized. No aortic stenosis is present. Pulmonic Valve: The pulmonic valve was not well visualized. Pulmonic valve regurgitation is not visualized. Aorta: The aortic root is normal in size and structure. IAS/Shunts: The interatrial septum was not well visualized.  LEFT VENTRICLE PLAX 2D LVIDd:         4.20 cm   Diastology LVIDs:         2.70 cm   LV e' medial:    4.20 cm/s LV PW:         1.00  cm   LV E/e' medial:  13.5 LV IVS:        0.80 cm   LV e' lateral:   3.90 cm/s LVOT diam:     2.00 cm   LV E/e' lateral: 14.5 LV SV:         54 LV SV Index:   28 LVOT Area:     3.14 cm  RIGHT VENTRICLE RV S prime:     11.10 cm/s TAPSE (M-mode): 2.0 cm LEFT ATRIUM             Index        RIGHT ATRIUM           Index LA diam:        2.70 cm 1.39 cm/m   RA Area:     15.50 cm LA Vol (A2C):   69.9 ml 35.88 ml/m  RA Volume:   39.10 ml  20.07 ml/m LA Vol (A4C):   39.5 ml 20.28 ml/m LA Biplane Vol: 70.0 ml 35.93 ml/m  AORTIC VALVE LVOT Vmax:   66.10 cm/s LVOT Vmean:  49.600 cm/s LVOT VTI:    0.171 m  AORTA Ao Root diam: 3.30 cm MITRAL VALVE               TRICUSPID VALVE MV Area (PHT): 2.29 cm    TR Peak grad:   27.7 mmHg MV Decel Time: 331 msec    TR Vmax:        263.00 cm/s MV E velocity: 56.60 cm/s MV A velocity: 70.20 cm/s  SHUNTS MV E/A ratio:  0.81        Systemic VTI:  0.17 m                            Systemic Diam:  2.00 cm Oswaldo Milian MD Electronically signed by Oswaldo Milian MD Signature Date/Time: 04/28/2021/1:56:04 PM    Final    CT HEAD CODE STROKE WO CONTRAST  Result Date: 05/01/2021 CLINICAL DATA:  Code stroke.  Acute neurologic deficit EXAM: CT HEAD WITHOUT CONTRAST TECHNIQUE: Contiguous axial images were obtained from the base of the skull through the vertex without intravenous contrast. COMPARISON:  Brain MRI 08/06/2020 FINDINGS: Brain: There is no mass, hemorrhage or extra-axial collection. There is generalized atrophy without lobar predilection. There is hypoattenuation of the periventricular white matter, most commonly indicating chronic ischemic microangiopathy. Hypoattenuation of the site of recent left insular infarct. Vascular: No abnormal hyperdensity of the major intracranial arteries or dural venous sinuses. No intracranial atherosclerosis. Skull: The visualized skull base, calvarium and extracranial soft tissues are normal. Sinuses/Orbits: No fluid levels or advanced  mucosal thickening of the visualized paranasal sinuses. No mastoid or middle ear effusion. The orbits are normal. ASPECTS [facility] Stroke Program Early CT Score) - Ganglionic level infarction (caudate, lentiform nuclei, internal capsule, insula, M1-M3 cortex): 7 - Supraganglionic infarction (M4-M6 cortex): 3 Total score (0-10 with 10 being normal): 10 IMPRESSION: 1. No acute intracranial abnormality. 2. ASPECTS is 10. 3. A recent left insular infarct, as demonstrated on MRI 04/29/2021. These results were called by telephone at the time of interpretation on 05/01/2021 at 8:41 pm to Dr. Erlinda Hong, Who verbally acknowledged these results. Electronically Signed   By: Ulyses Jarred M.D.   On: 05/01/2021 20:42   CT HEAD CODE STROKE WO CONTRAST`  Result Date: 04/29/2021 CLINICAL DATA:  Code stroke. 87 year old male with aphasia. Occluded left internal carotid artery and left MCA M2 branches on CTA 2 days ago. EXAM: CT HEAD WITHOUT CONTRAST TECHNIQUE: Contiguous axial images were obtained from the base of the skull through the vertex without intravenous contrast. COMPARISON:  [facility] CT head, CTA head and neck 04/27/2021. Brain MRI yesterday. FINDINGS: Brain: Small area of cytotoxic edema in the left insula corresponding to abnormal diffusion yesterday. Nearby hyperdense left MCA M2 branch in the sylvian fissure persist. But elsewhere gray-white matter differentiation remains stable since 04/27/2021. No acute intracranial hemorrhage identified. No midline shift, mass effect, or evidence of intracranial mass lesion. No ventriculomegaly. Vascular: Calcified atherosclerosis at the skull base. Persistent hyperdense left MCA M2 branch. Skull: No acute osseous abnormality identified. Sinuses/Orbits: Visualized paranasal sinuses and mastoids are stable and well aerated. Other: No acute orbit or scalp soft tissue finding. ASPECTS [facility] Stroke Program Early CT Score) Total score (0-10 with 10 being normal): 9 (abnormal  left insula as seen by MRI yesterday). IMPRESSION: 1. Expected CT appearance of the small left insular infarct on MRI yesterday. Elsewhere stable gray-white matter differentiation since the CT on 04/27/2021. ASPECTS 9. 2. No intracranial hemorrhage or mass effect. Study discussed by telephone with Dr. Davonna Belling on 04/29/2021 at 09:19 . Electronically Signed   By: Genevie Ann M.D.   On: 04/29/2021 09:20   CT HEAD CODE STROKE WO CONTRAST  Result Date: 04/27/2021 CLINICAL DATA:  Code stroke.  Acute neuro deficit. EXAM: CT HEAD WITHOUT CONTRAST TECHNIQUE: Contiguous axial images were obtained from the base of the skull through the vertex without intravenous contrast. COMPARISON:  MRI head 05/14/2013.  CT head 03/11/2009 FINDINGS: Brain: Generalized atrophy. Negative for hydrocephalus. Negative for acute infarct, hemorrhage, mass. Vascular: Hyperdensity of the left middle cerebral artery in the sylvian fissure. Probable left M2 branch occlusion. Correlate with symptoms. Skull: Negative Sinuses/Orbits: Mild mucosal edema right maxillary sinus.  Bilateral cataract extraction Other: None ASPECTS ([facility] Stroke Program Early CT Score) - Ganglionic level infarction (caudate, lentiform nuclei, internal capsule, insula, M1-M3 cortex): 7 - Supraganglionic infarction (M4-M6 cortex): 3 Total score (0-10 with 10 being normal): 10 IMPRESSION: 1. Negative for acute infarct or hemorrhage 2. Hyperdense left MCA branch in the sylvian fissure likely due to acute thrombosis. Correlate with symptoms. 3. ASPECTS is 10 4. Code stroke imaging results were communicated on 04/27/2021 at 6:39 pm to provider Lindzen via text page amion Electronically Signed   By: Franchot Gallo M.D.   On: 04/27/2021 18:46   CT ANGIO HEAD NECK W WO CM (CODE STROKE)  Result Date: 04/27/2021 CLINICAL DATA:  Acute neuro deficit. Speech abnormality which has cleared. EXAM: CT ANGIOGRAPHY HEAD AND NECK TECHNIQUE: Multidetector CT imaging of the head and neck  was performed using the standard protocol during bolus administration of intravenous contrast. Multiplanar CT image reconstructions and MIPs were obtained to evaluate the vascular anatomy. Carotid stenosis measurements (when applicable) are obtained utilizing NASCET criteria, using the distal internal carotid diameter as the denominator. CONTRAST:  65m OMNIPAQUE IOHEXOL 350 MG/ML SOLN COMPARISON:  CT head 04/27/2021 FINDINGS: CTA NECK FINDINGS Aortic arch: Atherosclerotic aortic arch and proximal great vessels. Proximal great vessels are patent. Right carotid system: Atherosclerotic calcification right carotid bifurcation without significant stenosis Left carotid system: Atherosclerotic disease at the left carotid bifurcation. Occlusion of the proximal left internal carotid artery. Left external carotid artery patent. Vertebral arteries: Both vertebral arteries are patent to the skull base with mild stenosis proximally on the right. Skeleton: Cervical spondylosis.  No acute skeletal abnormality. Other neck: Negative for mass or adenopathy Upper chest: Lung apices clear bilaterally. Review of the MIP images confirms the above findings CTA HEAD FINDINGS Anterior circulation: Right cavernous carotid widely patent. Right anterior and right middle cerebral arteries widely patent. Left internal carotid artery is occluded with reconstitution of the supraclinoid segment. Left anterior cerebral artery patent. Left M1 segment patent. Occlusion of the proximal left M2 branch inferior division which appears acute. Posterior circulation: Both vertebral arteries patent to the basilar. PICA patent bilaterally. Basilar widely patent. Superior cerebellar and posterior cerebral arteries patent bilaterally. Venous sinuses: Normal venous enhancement Anatomic variants: None Review of the MIP images confirms the above findings IMPRESSION: 1. Occlusion of the proximal left internal carotid artery of indeterminate age. However, this is  likely acute given the acute occlusion of the inferior division of the left MCA M2 branch. This was hyperdense on CT consistent with acute thrombus. 2. No significant right carotid stenosis. No significant vertebral stenosis. 3. These results were called by telephone at the time of interpretation on 04/27/2021 at 7:24 pm to [facility], who verbally acknowledged these results. Electronically Signed   By: CFranchot GalloM.D.   On: 04/27/2021 19:29   CT ANGIO HEAD NECK W WO CM W PERF (CODE STROKE)  Result Date: 04/29/2021 CLINICAL DATA:  Neuro deficit, acute, stroke suspected EXAM: CT ANGIOGRAPHY HEAD AND NECK CT PERFUSION BRAIN TECHNIQUE: Multidetector CT imaging of the head and neck was performed using the standard protocol during bolus administration of intravenous contrast. Multiplanar CT image reconstructions and MIPs were obtained to evaluate the vascular anatomy. Carotid stenosis measurements (when applicable) are obtained utilizing NASCET criteria, using the distal internal carotid diameter as the denominator. Multiphase CT imaging of the brain was performed following IV bolus contrast injection. Subsequent parametric perfusion maps were calculated using RAPID software. CONTRAST:  1030mOMNIPAQUE IOHEXOL 350  MG/ML SOLN COMPARISON:  Recent CTA and MRI brain FINDINGS: CTA NECK Aortic arch: Calcified plaque along the arch and patent great vessel origins. Right carotid system: Patent. Stable appearance. No hemodynamically significant stenosis at the ICA origin. Left carotid system: Patent common carotid. There has been some recanalization of the internal carotid artery. Persistent mixed plaque including irregular noncalcified plaque along the proximal ICA with superimposed thrombus. There is focal near occlusion with submillimeter residual lumen. Vessel caliber remains small reflecting flow limitation. Vertebral arteries: Patent.  Stable appearance. Skeleton: Stable advanced cervical spine  degenerative changes. Other neck: No new abnormality. Upper chest: Emphysema.  No new abnormality. Review of the MIP images confirms the above findings CTA HEAD Anterior circulation: As in the neck, there is no flow present within the left ICA. Left M1 MCA is patent. Persistent left M2 MCA occlusion although now slightly more distal. There is some reconstitution at the M2-M3 junction as before. Right middle and both anterior cerebral arteries are patent. Posterior circulation: Intracranial vertebral arteries are patent. Basilar artery is patent. Major cerebellar artery origins are patent. Posterior cerebral arteries are patent. Venous sinuses: Patent as allowed by contrast bolus timing. Review of the MIP images confirms the above findings CT Brain Perfusion Findings: CBF (<30%) Volume: 34m Perfusion (Tmax>6.0s) volume: 824mMismatch Volume: 8373mnfarction Location: Left MCA territory IMPRESSION: Interval partial recanalization of the cervical left ICA. Remains focal near occlusion with submillimeter lumen and decreased distal vessel caliber reflecting flow limitation. Intracranially, the left ICA is now patent. There is a persistent left M2 MCA occlusion, but the occlusion now occurs more distally within the sylvian fissure. Some distal reconstitution is present. Perfusion imaging demonstrates no core infarction and 83 mL of penumbra in the left MCA territory. Electronically Signed   By: PraMacy MisD.   On: 04/29/2021 10:01   IR ANGIO INTRA EXTRACRAN SEL INTERNAL CAROTID UNI L MOD SED  Result Date: 05/05/2021 INDICATION: LloMACARIO SHEAR a [age]o. male with a past medical history significant for GERD, COPD, CKD III who [facility] with complaints of word finding difficulties/unsteady gait. He was found to have a left ICA occlusion and no intervention was recommended as his deficits had resolved. He was discharged on 12/2 with instructions to begin Plavix and ASA, unfortunately he began to  experience similar symptoms and returned to the ED on 12/3. He was again admitted and due to the presence of thrombus in the left ICA and resolution of symptoms decision was made to schedule like carotid angioplasty and stenting within a week. Unfortunately he began to experience slurred speech again about 6 hours later and he was brought back to the ED. Code stroke was activated and no acute abnormality was seen on imaging, he was found to be hypotensive for which he was given a fluid bolus and this improved his slurred speech. He comes to our service today for diagnostic cerebral angiogram left carotid stenting with cerebral protection device. EXAM: ULTRASOUND-GUIDED VASCULAR ACCESS DIAGNOSTIC CEREBRAL ANGIOGRAM LEFT INTERNAL CAROTID ARTERY ANGIOPLASTY AND STENTING WITH CEREBRAL PROTECTION DEVICE COMPARISON:  CT/CT angiogram of the head and neck April 29, 2021. MEDICATIONS: Refer to anesthesia documentation. ANESTHESIA/SEDATION: The procedure was performed under monitored anesthesia care (MAC). CONTRAST:  100 mL of Omnipaque 300 milligram/mL FLUOROSCOPY TIME:  Fluoroscopy Time: 22 minutes 18 seconds (393 mGy). COMPLICATIONS: None immediate. TECHNIQUE: Informed written consent was obtained from the patient after a thorough discussion of the procedural risks, benefits and alternatives.  All questions were addressed. Maximal Sterile Barrier Technique was utilized including caps, mask, sterile gowns, sterile gloves, sterile drape, hand hygiene and skin antiseptic. A timeout was performed prior to the initiation of the procedure. The right groin was prepped and draped in the usual sterile fashion. Using a micropuncture kit and the modified Seldinger technique, access was gained to the right common femoral artery and an 8 French sheath was placed. Real-time ultrasound guidance was utilized for vascular access including the acquisition of a permanent ultrasound image documenting patency of the accessed vessel. Under  fluoroscopy, an 8 [place] Walrus balloon guide catheter was navigated over a 6 [place] Berenstein 2 catheter and a 0.035" Terumo Glidewire into the aortic arch. The catheter was placed into the left common carotid artery. Frontal and lateral angiograms of the neck were obtained followed by frontal and lateral angiograms of the head. FINDINGS: 1. Atherosclerotic changes of the right common femoral artery with calcified plaque resulting in mild stenosis. Vessel diameter is adequate for vascular access. 2. Severe stenosis of the left internal carotid artery at the distal aspect of the carotid bulb with near occlusion (99% stenosis). 3. There is delayed anterograde flow with minimal opacification of the left MCA vascular tree with occlusion of a left M3/MCA posterior division branch, corresponding to occluded branch on prior CT angiogram. 4. No contrast opacification of the right ACA vascular tree. PROCEDURE: Frontal and lateral angiograms of the neck were obtained and utilized as biplane roadmap. Then, and Emboshield NAV 6 cerebral protection device was navigated into the distal cervical segment of the left ICA. Then, left ICA angioplasty was performed with a 4 x 30 mm Viatrac balloon under fluoroscopy while the walrus balloon was inflated. Next, a 6-8 x 40 mm Xact carotid stent was deployed pain in the distal left common carotid artery and carotid bulb while the walrus balloon was inflated. Subsequently, the 4 x 30 mm Viatrac balloon was navigated in the recently deployed stent. In stent angioplasty was performed under fluoroscopy. Then, the cerebral protection device was recaptured. Left ICA angiograms with frontal lateral views of the neck showed brisk anterograde flow with complete resolution of stenosis. Frontal and lateral angiograms of the head showed improved contrast opacification of the left MCA vascular tree with new opacification of the left ACA vascular tree. Delayed left ICA angiograms with frontal and  lateral views of the neck showed no evidence of in stent clot formation. The catheter was subsequently withdrawn. A right common femoral artery angiogram was obtained in right anterior oblique view. The sheath was exchanged over the wire for a Perclose pro style. Attempted femoral closure with the was Perclose unsuccessful. The device was then exchanged over the wire for an 8 [place] Angio-Seal which was utilized for access closure. Adequate hemostasis was achieved. IMPRESSION: Successful and uncomplicated cervical left internal carotid artery angioplasty and stenting with cerebral protection device. No evidence of thromboembolic complication. PLAN: Continue on dual anti-platelet therapy with aspirin and Plavix. Carotid duplex in 3 months to evaluate for stent patency. Electronically Signed   By: Pedro Earls M.D.   On: 05/05/2021 16:08    Microbiology: Recent Results (from the past 240 hour(s))  Resp Panel by RT-PCR (Flu A&B, Covid) Nasopharyngeal Swab     Status: None   Collection Time: 04/29/21  9:02 AM   Specimen: Nasopharyngeal Swab; Nasopharyngeal(NP) swabs in vial transport medium  Result Value Ref Range Status   SARS Coronavirus 2 by RT PCR NEGATIVE NEGATIVE Final  Comment: (NOTE) SARS-CoV-2 target nucleic acids are NOT DETECTED.  The SARS-CoV-2 RNA is generally detectable in upper respiratory specimens during the acute phase of infection. The lowest concentration of SARS-CoV-2 viral copies this assay can detect is 138 copies/mL. A negative result does not preclude SARS-Cov-2 infection and should not be used as the sole basis for treatment or other patient management decisions. A negative result may occur with  improper specimen collection/handling, submission of specimen other than nasopharyngeal swab, presence of viral mutation(s) within the areas targeted by this assay, and inadequate number of viral copies(<138 copies/mL). A negative result must be combined  with clinical observations, patient history, and epidemiological information. The expected result is Negative.  Fact Sheet for Patients:  EntrepreneurPulse.com.au  Fact Sheet for Healthcare Providers:  IncredibleEmployment.be  This test is no t yet approved or cleared by the [place] FDA and  has been authorized for detection and/or diagnosis of SARS-CoV-2 by FDA under an Emergency Use Authorization (EUA). This EUA will remain  in effect (meaning this test can be used) for the duration of the COVID-19 declaration under Section 564(b)(1) of the Act, 21 U.S.C.section 360bbb-3(b)(1), unless the authorization is terminated  or revoked sooner.       Influenza A by PCR NEGATIVE NEGATIVE Final   Influenza B by PCR NEGATIVE NEGATIVE Final    Comment: (NOTE) The Xpert Xpress SARS-CoV-2/FLU/RSV plus assay is intended as an aid in the diagnosis of influenza from Nasopharyngeal swab specimens and should not be used as a sole basis for treatment. Nasal washings and aspirates are unacceptable for Xpert Xpress SARS-CoV-2/FLU/RSV testing.  Fact Sheet for Patients: EntrepreneurPulse.com.au  Fact Sheet for Healthcare Providers: IncredibleEmployment.be  This test is not yet approved or cleared by the [place] FDA and has been authorized for detection and/or diagnosis of SARS-CoV-2 by FDA under an Emergency Use Authorization (EUA). This EUA will remain in effect (meaning this test can be used) for the duration of the COVID-19 declaration under Section 564(b)(1) of the Act, 21 U.S.C. section 360bbb-3(b)(1), unless the authorization is terminated or revoked.  Performed at [facility], [address], [place], [place] [address]   MRSA Next Gen by PCR, Nasal     Status: None   Collection Time: 05/02/21 11:47 AM   Specimen: Nasal Mucosa; Nasal Swab  Result Value Ref Range Status   MRSA by PCR Next Gen NOT DETECTED  NOT DETECTED Final    Comment: (NOTE) The GeneXpert MRSA Assay (FDA approved for NASAL specimens only), is one component of a comprehensive MRSA colonization surveillance program. It is not intended to diagnose MRSA infection nor to guide or monitor treatment for MRSA infections. Test performance is not FDA approved in patients less than 52 years old. Performed at [facility], [place] [address], [place],  [address]   Resp Panel by RT-PCR (Flu A&B, Covid) Nasopharyngeal Swab     Status: None   Collection Time: 05/03/21  9:09 AM   Specimen: Nasopharyngeal Swab; Nasopharyngeal(NP) swabs in vial transport medium  Result Value Ref Range Status   SARS Coronavirus 2 by RT PCR NEGATIVE NEGATIVE Final    Comment: (NOTE) SARS-CoV-2 target nucleic acids are NOT DETECTED.  The SARS-CoV-2 RNA is generally detectable in upper respiratory specimens during the acute phase of infection. The lowest concentration of SARS-CoV-2 viral copies this assay can detect is 138 copies/mL. A negative result does not preclude SARS-Cov-2 infection and should not be used as the sole basis for treatment or other  patient management decisions. A negative result may occur with  improper specimen collection/handling, submission of specimen other than nasopharyngeal swab, presence of viral mutation(s) within the areas targeted by this assay, and inadequate number of viral copies(<138 copies/mL). A negative result must be combined with clinical observations, patient history, and epidemiological information. The expected result is Negative.  Fact Sheet for Patients:  EntrepreneurPulse.com.au  Fact Sheet for Healthcare Providers:  IncredibleEmployment.be  This test is no t yet approved or cleared by the [place] FDA and  has been authorized for detection and/or diagnosis of SARS-CoV-2 by FDA under an Emergency Use Authorization (EUA). This EUA will remain  in effect  (meaning this test can be used) for the duration of the COVID-19 declaration under Section 564(b)(1) of the Act, 21 U.S.C.section 360bbb-3(b)(1), unless the authorization is terminated  or revoked sooner.       Influenza A by PCR NEGATIVE NEGATIVE Final   Influenza B by PCR NEGATIVE NEGATIVE Final    Comment: (NOTE) The Xpert Xpress SARS-CoV-2/FLU/RSV plus assay is intended as an aid in the diagnosis of influenza from Nasopharyngeal swab specimens and should not be used as a sole basis for treatment. Nasal washings and aspirates are unacceptable for Xpert Xpress SARS-CoV-2/FLU/RSV testing.  Fact Sheet for Patients: EntrepreneurPulse.com.au  Fact Sheet for Healthcare Providers: IncredibleEmployment.be  This test is not yet approved or cleared by the [place] FDA and has been authorized for detection and/or diagnosis of SARS-CoV-2 by FDA under an Emergency Use Authorization (EUA). This EUA will remain in effect (meaning this test can be used) for the duration of the COVID-19 declaration under Section 564(b)(1) of the Act, 21 U.S.C. section 360bbb-3(b)(1), unless the authorization is terminated or revoked.  Performed at [facility], [place] [address] [place], [place] [address]      Labs: Basic Metabolic Panel: Recent Labs  Lab 05/02/21 0204 05/03/21 0346 05/04/21 0119 05/05/21 0325 05/06/21 0337  NA 134* 135 135 135 134*  K 3.9 4.4 4.0 4.3 3.9  CL 107 103 105 103 104  CO2 19* 23 20* 22 22  GLUCOSE 101* 92 91 103* 120*  BUN _0 CREATININE 1.54* 1.55* 1.65* 1.45* 1.62*  CALCIUM 8.2* 8.3* 8.5* 8.0* 8.2*  MG  --  1.8 1.9  --   --    Liver Function Tests: Recent Labs  Lab 05/01/21 2018  AST 25  ALT 14  ALKPHOS 80  BILITOT 0.8  PROT 6.1*  ALBUMIN 3.3*   No results for input(s): LIPASE, AMYLASE in the last 168 hours. No results for input(s): AMMONIA in the last 168 hours. CBC: Recent Labs  Lab  05/01/21 2018 05/01/21 2039 05/02/21 0204 05/03/21 0346 05/04/21 0119  WBC 8.5  --  7.8 8.5 8.6  NEUTROABS 6.0  --   --   --   --   HGB 14.5 14.6 13.0 13.3 13.2  HCT 43.6 43.0 39.2 38.8* 40.1  MCV 93.6  --  94.7 92.8 93.5  PLT 255  --  215 236 242   Cardiac Enzymes: No results for input(s): CKTOTAL, CKMB, CKMBINDEX, TROPONINI in the last 168 hours. BNP: BNP (last 3 results) No results for input(s): BNP in the last 8760 hours.  ProBNP (last 3 results) No results for input(s): PROBNP in the last 8760 hours.  CBG: No results for input(s): GLUCAP in the last 168 hours.     Signed:  Domenic Polite MD.  [facility] 05/06/2021, 1:44 PM

## 2021-05-08 ENCOUNTER — Ambulatory Visit (INDEPENDENT_AMBULATORY_CARE_PROVIDER_SITE_OTHER): Payer: Medicare Other | Admitting: Nurse Practitioner

## 2021-05-08 ENCOUNTER — Encounter: Payer: Self-pay | Admitting: Nurse Practitioner

## 2021-05-08 ENCOUNTER — Telehealth: Payer: Self-pay

## 2021-05-08 VITALS — BP 124/78 | HR 69 | Temp 97.3°F | Resp 20 | Ht 71.0 in | Wt 170.0 lb

## 2021-05-08 DIAGNOSIS — L089 Local infection of the skin and subcutaneous tissue, unspecified: Secondary | ICD-10-CM | POA: Insufficient documentation

## 2021-05-08 MED ORDER — CEPHALEXIN 500 MG PO CAPS: 500.0000 mg | ORAL_CAPSULE | Freq: Two times a day (BID) | ORAL | 0 refills | Status: DC

## 2021-05-08 NOTE — Assessment & Plan Note (Signed)
Rash presented as IV insertion site infection.  Started patient on Keflex 500 mg twice daily for 7 days.  Follow-up in 3 days.  Tylenol for pain and fever.

## 2021-05-08 NOTE — Telephone Encounter (Signed)
Transition Care Management Follow-up Telephone Call Date of discharge and from where: [facility] 05/06/21 - [facility] How have you been since you were released from the hospital? Doing well overall - just lost his wife, so he's having a hard time with that Any questions or concerns? No - seen this morning for infected IV site, otherwise no concerns  Items Reviewed: Did the pt receive and understand the discharge instructions provided? Yes  Medications obtained and verified? Yes  Other? No  Any new allergies since your discharge? No  Dietary orders reviewed? Yes Do you have support at home? Yes   Home Care and Equipment/Supplies: Were home health services ordered? yes If so, what is the name of the agency? [facility]  Has the agency set up a time to come to the patient's home? no Were any new equipment or medical supplies ordered?  No What is the name of the medical supply agency? N/a Were you able to get the supplies/equipment? not applicable Do you have any questions related to the use of the equipment or supplies? No  Functional Questionnaire: (I = Independent and D = Dependent) ADLs: I - with assistance  Bathing/Dressing- I  Meal Prep- D  Eating- I  Maintaining continence- I  Transferring/Ambulation- I - with walker  Managing Meds- I  Follow up appointments reviewed:  PCP Hospital f/u appt confirmed? Yes  Scheduled to see Gottschalk on 05/10/21 @ 10:15. [facility] f/u appt confirmed? Yes  Scheduled to see Jeffie Pollock, Urologist on 12/13 Are transportation arrangements needed? No  If their condition worsens, is the pt aware to call PCP or go to the Emergency Dept.? Yes Was the patient provided with contact information for the PCP's office or ED? Yes Was to pt encouraged to call back with questions or concerns? Yes

## 2021-05-08 NOTE — Progress Notes (Signed)
Acute Office Visit  Subjective:    Patient ID: Charles Golden, male    DOB: 1926-02-14, 87 y.o.   MRN: [id number]  Chief Complaint  Patient presents with   redness at IV site     Rash This is a new problem. Episode onset: In the past 2 to 3 days. The problem has been gradually worsening since onset. The affected locations include the right hand. The rash is characterized by burning and redness. He was exposed to nothing. Past treatments include nothing. The treatment provided no relief.  Patient was recently in the hospital and had an IV insertion.  Incision site is red hot and painful.  Past Medical History:  Diagnosis Date   Bronchitis, chronic (HCC)    COPD (chronic obstructive pulmonary disease) (HCC)    Enlarged prostate    GERD (gastroesophageal reflux disease)    Glaucoma    Glucagonoma    Hernia, incisional    at present   [facility] (hard of hearing)    IBS (irritable bowel syndrome)    Sciatic pain    Sinus congestion     Past Surgical History:  Procedure Laterality Date   BOWEL RESECTION N/A 09/04/2012   Procedure: SMALL BOWEL RESECTION;  Surgeon: Donato Heinz, MD;  Location: [facility];  Service: General;  Laterality: N/A;  Anastimosis   HEMORRHOID SURGERY     HERNIA REPAIR Bilateral 70's   INGUINAL HERNIA REPAIR Left 09/14/2013   Procedure: LEFT INGUINAL HERNIORRHAPHY;  Surgeon: Jamesetta So, MD;  Location: [facility];  Service: General;  Laterality: Left;   INGUINAL HERNIA REPAIR Right 12/23/2018   Procedure: RECURRENT RIGHT INGUINAL HERNIA  REPAIR  WITH MESH;  Surgeon: Aviva Signs, MD;  Location: [facility];  Service: General;  Laterality: Right;   INSERTION OF MESH Left 09/14/2013   Procedure: INSERTION OF MESH;  Surgeon: Jamesetta So, MD;  Location: [facility];  Service: General;  Laterality: Left;   IR ANGIO INTRA EXTRACRAN SEL INTERNAL CAROTID UNI L MOD SED  05/04/2021   IR INTRAVSC STENT CERV CAROTID W/EMB-PROT MOD SED INCL ANGIO  05/04/2021   IR US GUIDE VASC ACCESS  RIGHT  05/04/2021   KNEE ARTHROSCOPY Left 07/21/2014   Procedure: LEFT KNEE ARTHROSCOPY WITH MEDIAL MENISCAL DEBRIDEMENT ;  Surgeon: Gearlean Alf, MD;  Location: [facility];  Service: Orthopedics;  Laterality: Left;   LAPAROTOMY N/A 09/04/2012   Procedure: EXPLORATORY LAPAROTOMY;  Surgeon: Donato Heinz, MD;  Location: [facility];  Service: General;  Laterality: N/A;   RADIOLOGY WITH ANESTHESIA Left 05/04/2021   Procedure: LEFT ICA STENT;  Surgeon: Pedro Earls, MD;  Location: [facility];  Service: Radiology;  Laterality: Left;   SKIN LESION EXCISION     [name]   TONSILLECTOMY      Family History  Problem Relation Age of Onset   Heart disease Mother        Valve replacement and pacemaker   Colon cancer Father    Asthma Other        cousin   Healthy Daughter    Healthy Daughter    Healthy Daughter     Social History   Socioeconomic History   Marital status: Married    Spouse name: [name]   Number of children: 3   Years of education: 12+   Highest education level: Bachelor's degree (e.g., BA, AB, BS)  Occupational History   Occupation: Retired    Comment: Immunologist  Tobacco Use  Smoking status: Former    Packs/day: 1.00    Years: 30.00    Pack years: 30.00    Types: Cigarettes    Quit date: 05/28/2006    Years since quitting: 14.9   Smokeless tobacco: Never  Vaping Use   Vaping Use: Never used  Substance and Sexual Activity   Alcohol use: Not Currently    Alcohol/week: 0.0 standard drinks   Drug use: No   Sexual activity: Yes    Birth control/protection: None  Other Topics Concern   Not on file  Social History Narrative   Patient lives at home with [name].    Patient has 3 children.    Patient has his BS   Patient is retired.    Drinks about 2 cups of coffee per day.      [facility]:   He is still married. 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 He is from  [place].       Social Determinants of Health   Financial Resource Strain: Not on file  Food Insecurity: Not on file  Transportation Needs: Not on file  Physical Activity: Not on file  Stress: Not on file  Social Connections: Not on file  Intimate Partner Violence: Not on file    Outpatient Medications Prior to Visit  Medication Sig Dispense Refill   aspirin EC 325 MG EC tablet Take 1 tablet (325 mg total) by mouth daily. 30 tablet 3   atorvastatin (LIPITOR) 40 MG tablet Take 1 tablet (40 mg total) by mouth daily. 30 tablet 3   azelastine (ASTELIN) 0.1 % nasal spray PLACE 1 SPRAY IN EACH NOSTRIL ONCE A DAY AS DIRECTED 30 mL 4   BREO ELLIPTA 200-25 MCG/INH AEPB Inhale 1 puff into the lungs daily. 60 each 4   Cholecalciferol (VITAMIN D3) 2000 UNITS TABS Take 1 tablet by mouth daily.     clopidogrel (PLAVIX) 75 MG tablet Take 1 tablet (75 mg total) by mouth daily. 30 tablet 2   Cyanocobalamin (VITAMIN B-12) 5000 MCG SUBL Place 1 tablet under the tongue daily.     finasteride (PROSCAR) 5 MG tablet Take 5 mg by mouth daily.     fluticasone (FLONASE) 50 MCG/ACT nasal spray Place 2 sprays into both nostrils daily.     guaiFENesin (MUCINEX) 600 MG 12 hr tablet Take 600 mg by mouth daily as needed for to loosen phlegm.     latanoprost (XALATAN) 0.005 % ophthalmic solution Place 1 drop into the left eye at bedtime. (Patient taking differently: Place 1 drop into both eyes at bedtime.) 2.5 mL 1   midodrine (PROAMATINE) 5 MG tablet Take 1 tablet (5 mg total) by mouth 2 (two) times daily with a meal for 7 days. 14 tablet 0   pantoprazole (PROTONIX) 40 MG tablet Take 1 tablet (40 mg total) by mouth daily. 30 tablet 2   tamsulosin (FLOMAX) 0.4 MG CAPS capsule Take 1 capsule (0.4 mg total) by mouth daily. Take 0.52m daily for 1 week then increase to 0.823mdaily thereafter if BP is stable/normal     timolol (TIMOPTIC) 0.5 % ophthalmic solution Place 1 drop into the left eye every morning.     VENTOLIN  HFA 108 (90 Base) MCG/ACT inhaler INHALE 2 PUFFS EVERY 6 HOURS AS NEEDED FOR WHEEZING OR SHORTNESS OF BREATH (Patient taking differently: 1 puff every 6 (six) hours as needed for shortness of breath.) 18 g 0  acetaminophen (TYLENOL) 325 MG tablet Take 2 tablets (650 mg total) by mouth every 4 (four) hours as needed for mild pain (or temp > 37.5 C (99.5 F)). (Patient not taking: Reported on 05/08/2021)     No facility-administered medications prior to visit.    Allergies  Allergen Reactions   Demerol [Meperidine] Other (See Comments)    Pt. States "he woke up during a colonoscopy"   Spiriva Handihaler [Tiotropium Bromide Monohydrate] Other (See Comments)    Mild Urinary Retention    Review of Systems  Constitutional: Negative.   HENT: Negative.    Eyes: Negative.   Respiratory: Negative.    Cardiovascular: Negative.   Gastrointestinal: Negative.   Skin:  Positive for rash.  All other systems reviewed and are negative.     Objective:    Physical Exam Vitals and nursing note reviewed.  Constitutional:      Appearance: Normal appearance.  HENT:     Head: Normocephalic.     Right Ear: External ear normal.     Left Ear: External ear normal.     Mouth/Throat:     Mouth: Mucous membranes are moist.     Pharynx: Oropharynx is clear.  Eyes:     Conjunctiva/sclera: Conjunctivae normal.  Cardiovascular:     Pulses: Normal pulses.     Heart sounds: Normal heart sounds.  Pulmonary:     Effort: Pulmonary effort is normal.     Breath sounds: Normal breath sounds.  Abdominal:     General: Bowel sounds are normal.  Skin:    General: Skin is warm.     Findings: Rash present.  Neurological:     Mental Status: He is alert and oriented to person, place, and time.    BP 124/78   Pulse 69   Temp (!) 97.3 F (36.3 C)   Resp 20   Ht 5' 11"  (1.803 m)   Wt 170 lb (77.1 kg)   SpO2 98%   BMI 23.71 kg/m  Wt Readings from Last 3 Encounters:  05/08/21 170 lb (77.1 kg)  05/02/21 167  lb 1.7 oz (75.8 kg)  04/29/21 171 lb 11.8 oz (77.9 kg)    Health Maintenance Due  Topic Date Due   COVID-19 Vaccine (5 - Booster for Moderna series) 02/21/2021    There are no preventive care reminders to display for this patient.   Lab Results  Component Value Date   TSH 2.360 09/04/2019   Lab Results  Component Value Date   WBC 8.6 05/04/2021   HGB 13.2 05/04/2021   HCT 40.1 05/04/2021   MCV 93.5 05/04/2021   PLT 242 05/04/2021   Lab Results  Component Value Date   NA 134 (L) 05/06/2021   K 3.9 05/06/2021   CO2 22 05/06/2021   GLUCOSE 120 (H) 05/06/2021   BUN 14 05/06/2021   CREATININE 1.62 (H) 05/06/2021   BILITOT 0.8 05/01/2021   ALKPHOS 80 05/01/2021   AST 25 05/01/2021   ALT 14 05/01/2021   PROT 6.1 (L) 05/01/2021   ALBUMIN 3.3 (L) 05/01/2021   CALCIUM 8.2 (L) 05/06/2021   ANIONGAP 8 05/06/2021   EGFR 43 (L) 12/13/2020   GFR 38.98 (L) 02/01/2021   Lab Results  Component Value Date   CHOL 105 04/30/2021   Lab Results  Component Value Date   HDL 42 04/30/2021   Lab Results  Component Value Date   LDLCALC 50 04/30/2021   Lab Results  Component Value Date   TRIG 64 04/30/2021  Lab Results  Component Value Date   CHOLHDL 2.5 04/30/2021   Lab Results  Component Value Date   HGBA1C 5.8 (H) 04/30/2021       Assessment & Plan:   Problem List Items Addressed This Visit       Musculoskeletal and Integument   Skin infection - Primary    Rash presented as IV insertion site infection.  Started patient on Keflex 500 mg twice daily for 7 days.  Follow-up in 3 days.  Tylenol for pain and fever.      Relevant Medications   cephALEXin (KEFLEX) 500 MG capsule     Meds ordered this encounter  Medications   cephALEXin (KEFLEX) 500 MG capsule    Sig: Take 1 capsule (500 mg total) by mouth 2 (two) times daily.    Dispense:  14 capsule    Refill:  0    Order Specific Question:   Supervising Provider    Answer:   Claretta Fraise [[id number]]      Ivy Lynn, NP

## 2021-05-08 NOTE — Telephone Encounter (Signed)
Transition Care Management Unsuccessful Follow-up Telephone Call  Date of discharge and from where:  [facility] 05/06/21 - [facility]  Attempts:  1st Attempt  Reason for unsuccessful TCM follow-up call:  Left voice message  Patient was seen this morning to check out infected IV wound, but still has TCM appt with [name] 12/14 - I need to ask TCM Call questions.

## 2021-05-08 NOTE — Telephone Encounter (Signed)
[facility] hospital follow up-admitted

## 2021-05-08 NOTE — Patient Instructions (Signed)
Erysipelas Erysipelas is an infection that affects the skin and tissues near the surface of the skin. It causes the skin to become red, swollen, and painful. The infection is most common on the legs but may also affect other areas, such as the face. With treatment, the infection usually goes away in a few days. If not treated, the infection can spread or lead to other problems, such as collections of pus (abscesses). What are the causes? This condition is caused by bacteria. Most often, it is caused by bacteria called streptococci.  Bacteria may get into the skin through a break in the skin, such as: A cut or scrape. An incision from surgery. A burn. An insect bite. An open sore. A crack in the skin. Sometimes, it is not known how the bacteria infected the skin. What increases the risk? You are more likely to develop this condition if you: Are a young child. Are an older adult. Have a weakened disease-fighting system (immune system), such as having HIV or AIDS. Have diabetes. Drink a lot of alcohol. Had recent surgery. Have a yeast infection of the skin. Have swollen legs. What are the signs or symptoms? The infection causes a reddened area on the skin. This reddened area may: Be painful and swollen. Have a clear border around it. Feel itchy and hot. Develop blisters or abscesses. Other symptoms may include: Fever. Chills. Nausea and vomiting. Swollen glands (lymph nodes), such as in the neck. Headache. Feeling tired (fatigue). Loss of appetite. How is this diagnosed? This condition is diagnosed based on: A physical exam. Your health care provider will examine your skin closely. Your symptoms and medical history. How is this treated? This condition is treated with antibiotic medicine. Symptoms usually get better within a few days after starting antibiotics. Follow these instructions at home: Medicines Take other over-the-counter and prescription medicines only as told by  your health care provider. Take your antibiotic medicine as told by your health care provider. Do not stop taking the antibiotic even if your condition starts to improve. Ask your health care provider if it is safe for you to take medicines for pain as needed, such as acetaminophen or ibuprofen. General instructions  If the affected area is on an arm or leg, raise (elevate) the affected arm or leg above the level of your heart while you are sitting or lying down. Do not put any creams or lotions on the affected area of your skin unless your health care provider tells you to do that. Do not share bedding, towels, or washcloths (linens) with other people. Use only your own linens to prevent the infection from spreading to others. Follow instructions from your health care provider about how to take care of your wound. Make sure you: Wash your hands with soap and water for at least 20 seconds before and after you change your bandage (dressing). If soap and water are not available, use hand sanitizer. Change your dressing as told by your health care provider. Keep all follow-up visits. This is important. Contact a health care provider if: Your symptoms do not improve within 1-2 days of starting treatment. You develop new symptoms. You have a fever. You feel generally sick (malaise) with muscle aches and pains. Get help right away if: Your symptoms get worse. You develop vomiting or diarrhea that does not go away. Your red area gets larger or turns dark in color. You notice red streaks coming from the infected area. Summary Erysipelas is an infection affecting the  skin and tissues near the surface of the skin. It causes the skin to become red, swollen, and painful. This condition is caused by bacteria. Most often, it is caused by bacteria called streptococci. Bacteria may enter through a break in the skin. Sometimes, it is not known how the bacteria infected the skin. This condition is treated  with antibiotic medicine. Symptoms usually get better within a few days after starting antibiotics. This information is not intended to replace advice given to you by your health care provider. Make sure you discuss any questions you have with your health care provider. Document Revised: 10/15/2019 Document Reviewed: 10/14/2019 Elsevier Patient Education  [place].

## 2021-05-09 DIAGNOSIS — R350 Frequency of micturition: Secondary | ICD-10-CM | POA: Diagnosis not present

## 2021-05-09 DIAGNOSIS — R351 Nocturia: Secondary | ICD-10-CM | POA: Diagnosis not present

## 2021-05-09 DIAGNOSIS — N401 Enlarged prostate with lower urinary tract symptoms: Secondary | ICD-10-CM | POA: Diagnosis not present

## 2021-05-09 DIAGNOSIS — R3914 Feeling of incomplete bladder emptying: Secondary | ICD-10-CM | POA: Diagnosis not present

## 2021-05-10 ENCOUNTER — Emergency Department (HOSPITAL_COMMUNITY): Payer: Medicare Other

## 2021-05-10 ENCOUNTER — Encounter (HOSPITAL_COMMUNITY): Payer: Self-pay | Admitting: Emergency Medicine

## 2021-05-10 ENCOUNTER — Other Ambulatory Visit: Payer: Self-pay

## 2021-05-10 ENCOUNTER — Ambulatory Visit (INDEPENDENT_AMBULATORY_CARE_PROVIDER_SITE_OTHER): Payer: Medicare Other | Admitting: Family Medicine

## 2021-05-10 ENCOUNTER — Encounter: Payer: Self-pay | Admitting: Family Medicine

## 2021-05-10 ENCOUNTER — Inpatient Hospital Stay (HOSPITAL_COMMUNITY)
Admission: EM | Admit: 2021-05-10 | Discharge: 2021-05-13 | DRG: 871 | Disposition: A | Discharge status: Home Health | Payer: Medicare Other | Attending: Internal Medicine | Admitting: Internal Medicine

## 2021-05-10 ENCOUNTER — Telehealth: Payer: Self-pay

## 2021-05-10 VITALS — BP 120/80 | HR 79 | Temp 98.2°F | Ht 71.0 in | Wt 169.6 lb

## 2021-05-10 DIAGNOSIS — Z8673 Personal history of transient ischemic attack (TIA), and cerebral infarction without residual deficits: Secondary | ICD-10-CM

## 2021-05-10 DIAGNOSIS — Z20822 Contact with and (suspected) exposure to covid-19: Secondary | ICD-10-CM | POA: Diagnosis present

## 2021-05-10 DIAGNOSIS — A419 Sepsis, unspecified organism: Secondary | ICD-10-CM | POA: Diagnosis present

## 2021-05-10 DIAGNOSIS — I4891 Unspecified atrial fibrillation: Secondary | ICD-10-CM | POA: Diagnosis present

## 2021-05-10 DIAGNOSIS — Z87891 Personal history of nicotine dependence: Secondary | ICD-10-CM

## 2021-05-10 DIAGNOSIS — R457 State of emotional shock and stress, unspecified: Secondary | ICD-10-CM | POA: Diagnosis not present

## 2021-05-10 DIAGNOSIS — F4321 Adjustment disorder with depressed mood: Secondary | ICD-10-CM

## 2021-05-10 DIAGNOSIS — J449 Chronic obstructive pulmonary disease, unspecified: Secondary | ICD-10-CM

## 2021-05-10 DIAGNOSIS — R6520 Severe sepsis without septic shock: Secondary | ICD-10-CM | POA: Diagnosis present

## 2021-05-10 DIAGNOSIS — Z825 Family history of asthma and other chronic lower respiratory diseases: Secondary | ICD-10-CM

## 2021-05-10 DIAGNOSIS — Z9889 Other specified postprocedural states: Secondary | ICD-10-CM | POA: Diagnosis not present

## 2021-05-10 DIAGNOSIS — K219 Gastro-esophageal reflux disease without esophagitis: Secondary | ICD-10-CM | POA: Diagnosis not present

## 2021-05-10 DIAGNOSIS — Z79899 Other long term (current) drug therapy: Secondary | ICD-10-CM

## 2021-05-10 DIAGNOSIS — J4489 Other specified chronic obstructive pulmonary disease: Secondary | ICD-10-CM | POA: Diagnosis present

## 2021-05-10 DIAGNOSIS — N390 Urinary tract infection, site not specified: Secondary | ICD-10-CM | POA: Diagnosis present

## 2021-05-10 DIAGNOSIS — Z7982 Long term (current) use of aspirin: Secondary | ICD-10-CM

## 2021-05-10 DIAGNOSIS — R0602 Shortness of breath: Secondary | ICD-10-CM | POA: Diagnosis not present

## 2021-05-10 DIAGNOSIS — Z09 Encounter for follow-up examination after completed treatment for conditions other than malignant neoplasm: Secondary | ICD-10-CM | POA: Diagnosis not present

## 2021-05-10 DIAGNOSIS — R509 Fever, unspecified: Secondary | ICD-10-CM | POA: Diagnosis not present

## 2021-05-10 DIAGNOSIS — J189 Pneumonia, unspecified organism: Secondary | ICD-10-CM | POA: Diagnosis present

## 2021-05-10 DIAGNOSIS — Z8249 Family history of ischemic heart disease and other diseases of the circulatory system: Secondary | ICD-10-CM

## 2021-05-10 DIAGNOSIS — I639 Cerebral infarction, unspecified: Secondary | ICD-10-CM | POA: Diagnosis present

## 2021-05-10 DIAGNOSIS — J440 Chronic obstructive pulmonary disease with acute lower respiratory infection: Secondary | ICD-10-CM | POA: Diagnosis present

## 2021-05-10 DIAGNOSIS — I6930 Unspecified sequelae of cerebral infarction: Secondary | ICD-10-CM | POA: Diagnosis not present

## 2021-05-10 DIAGNOSIS — R000 Tachycardia, unspecified: Secondary | ICD-10-CM | POA: Diagnosis not present

## 2021-05-10 DIAGNOSIS — I63512 Cerebral infarction due to unspecified occlusion or stenosis of left middle cerebral artery: Secondary | ICD-10-CM | POA: Diagnosis not present

## 2021-05-10 DIAGNOSIS — N400 Enlarged prostate without lower urinary tract symptoms: Secondary | ICD-10-CM | POA: Diagnosis present

## 2021-05-10 DIAGNOSIS — N170 Acute kidney failure with tubular necrosis: Secondary | ICD-10-CM | POA: Diagnosis not present

## 2021-05-10 DIAGNOSIS — N1832 Chronic kidney disease, stage 3b: Secondary | ICD-10-CM | POA: Diagnosis present

## 2021-05-10 DIAGNOSIS — I809 Phlebitis and thrombophlebitis of unspecified site: Secondary | ICD-10-CM | POA: Diagnosis not present

## 2021-05-10 DIAGNOSIS — Z885 Allergy status to narcotic agent status: Secondary | ICD-10-CM

## 2021-05-10 DIAGNOSIS — I480 Paroxysmal atrial fibrillation: Secondary | ICD-10-CM | POA: Diagnosis present

## 2021-05-10 DIAGNOSIS — N1831 Chronic kidney disease, stage 3a: Secondary | ICD-10-CM

## 2021-05-10 DIAGNOSIS — N179 Acute kidney failure, unspecified: Secondary | ICD-10-CM | POA: Diagnosis present

## 2021-05-10 DIAGNOSIS — Y95 Nosocomial condition: Secondary | ICD-10-CM | POA: Diagnosis present

## 2021-05-10 HISTORY — DX: Unspecified atrial fibrillation: I48.91

## 2021-05-10 LAB — CMP14+EGFR
ALT: 14 IU/L (ref 0–44)
AST: 20 IU/L (ref 0–40)
Albumin/Globulin Ratio: 1.9 (ref 1.2–2.2)
Albumin: 3.9 g/dL (ref 3.5–4.6)
Alkaline Phosphatase: 105 IU/L (ref 44–121)
BUN/Creatinine Ratio: 11 (ref 10–24)
BUN: 19 mg/dL (ref 10–36)
Bilirubin Total: 0.3 mg/dL (ref 0.0–1.2)
CO2: 24 mmol/L (ref 20–29)
Calcium: 9.4 mg/dL (ref 8.6–10.2)
Chloride: 104 mmol/L (ref 96–106)
Creatinine, Ser: 1.74 mg/dL — ABNORMAL HIGH (ref 0.76–1.27)
Globulin, Total: 2.1 g/dL (ref 1.5–4.5)
Glucose: 97 mg/dL (ref 70–99)
Potassium: 5.5 mmol/L — ABNORMAL HIGH (ref 3.5–5.2)
Sodium: 141 mmol/L (ref 134–144)
Total Protein: 6 g/dL (ref 6.0–8.5)
eGFR: 36 mL/min/1.73 — ABNORMAL LOW

## 2021-05-10 LAB — I-STAT CHEM 8, ED
BUN: 23 mg/dL (ref 8–23)
Calcium, Ion: 1.03 mmol/L — ABNORMAL LOW (ref 1.15–1.40)
Chloride: 107 mmol/L (ref 98–111)
Creatinine, Ser: 1.9 mg/dL — ABNORMAL HIGH (ref 0.61–1.24)
Glucose, Bld: 113 mg/dL — ABNORMAL HIGH (ref 70–99)
HCT: 41 % (ref 39.0–52.0)
Hemoglobin: 13.9 g/dL (ref 13.0–17.0)
Potassium: 4.3 mmol/L (ref 3.5–5.1)
Sodium: 136 mmol/L (ref 135–145)
TCO2: 20 mmol/L — ABNORMAL LOW (ref 22–32)

## 2021-05-10 LAB — CBC
Hematocrit: 38.4 % (ref 37.5–51.0)
Hemoglobin: 12.7 g/dL — ABNORMAL LOW (ref 13.0–17.7)
MCH: 30.3 pg (ref 26.6–33.0)
MCHC: 33.1 g/dL (ref 31.5–35.7)
MCV: 92 fL (ref 79–97)
Platelets: 330 10*3/uL (ref 150–450)
RBC: 4.19 x10E6/uL (ref 4.14–5.80)
RDW: 12 % (ref 11.6–15.4)
WBC: 7.7 10*3/uL (ref 3.4–10.8)

## 2021-05-10 LAB — LACTIC ACID, PLASMA
Lactic Acid, Venous: 1.9 mmol/L (ref 0.5–1.9)
Lactic Acid, Venous: 1.3 mmol/L (ref 0.5–1.9)

## 2021-05-10 LAB — BRAIN NATRIURETIC PEPTIDE: B Natriuretic Peptide: 105.7 pg/mL — ABNORMAL HIGH (ref 0.0–100.0)

## 2021-05-10 LAB — COMPREHENSIVE METABOLIC PANEL WITH GFR
ALT: 18 U/L (ref 0–44)
AST: 22 U/L (ref 15–41)
Albumin: 3.4 g/dL — ABNORMAL LOW (ref 3.5–5.0)
Alkaline Phosphatase: 86 U/L (ref 38–126)
Anion gap: 12 (ref 5–15)
BUN: 21 mg/dL (ref 8–23)
CO2: 18 mmol/L — ABNORMAL LOW (ref 22–32)
Calcium: 9.1 mg/dL (ref 8.9–10.3)
Chloride: 107 mmol/L (ref 98–111)
Creatinine, Ser: 2.05 mg/dL — ABNORMAL HIGH (ref 0.61–1.24)
GFR, Estimated: 30 mL/min — ABNORMAL LOW
Glucose, Bld: 113 mg/dL — ABNORMAL HIGH (ref 70–99)
Potassium: 4.4 mmol/L (ref 3.5–5.1)
Sodium: 137 mmol/L (ref 135–145)
Total Bilirubin: 0.8 mg/dL (ref 0.3–1.2)
Total Protein: 6.4 g/dL — ABNORMAL LOW (ref 6.5–8.1)

## 2021-05-10 LAB — CBC WITH DIFFERENTIAL/PLATELET
Abs Immature Granulocytes: 0.09 K/uL — ABNORMAL HIGH (ref 0.00–0.07)
Basophils Absolute: 0 K/uL (ref 0.0–0.1)
Basophils Relative: 0 %
Eosinophils Absolute: 0.1 K/uL (ref 0.0–0.5)
Eosinophils Relative: 0 %
HCT: 41.1 % (ref 39.0–52.0)
Hemoglobin: 13.2 g/dL (ref 13.0–17.0)
Immature Granulocytes: 1 %
Lymphocytes Relative: 4 %
Lymphs Abs: 0.7 K/uL (ref 0.7–4.0)
MCH: 30.6 pg (ref 26.0–34.0)
MCHC: 32.1 g/dL (ref 30.0–36.0)
MCV: 95.4 fL (ref 80.0–100.0)
Monocytes Absolute: 1 K/uL (ref 0.1–1.0)
Monocytes Relative: 6 %
Neutro Abs: 13.9 K/uL — ABNORMAL HIGH (ref 1.7–7.7)
Neutrophils Relative %: 89 %
Platelets: 331 K/uL (ref 150–400)
RBC: 4.31 MIL/uL (ref 4.22–5.81)
RDW: 13.2 % (ref 11.5–15.5)
WBC: 15.7 K/uL — ABNORMAL HIGH (ref 4.0–10.5)
nRBC: 0 % (ref 0.0–0.2)

## 2021-05-10 LAB — TROPONIN I (HIGH SENSITIVITY)
Troponin I (High Sensitivity): 47 ng/L — ABNORMAL HIGH (ref <18)
Troponin I (High Sensitivity): 44 ng/L — ABNORMAL HIGH (ref <18)

## 2021-05-10 LAB — RESP PANEL BY RT-PCR (FLU A&B, COVID) ARPGX2
Influenza A by PCR: NEGATIVE
Influenza B by PCR: NEGATIVE
SARS Coronavirus 2 by RT PCR: NEGATIVE

## 2021-05-10 LAB — ACID FAST SMEAR+CULTURE W/RFLX
Acid Fast Culture: NEGATIVE
Acid Fast Smear: NEGATIVE

## 2021-05-10 LAB — PROCALCITONIN: Procalcitonin: 4.56 ng/mL

## 2021-05-10 LAB — SPECIMEN STATUS REPORT

## 2021-05-10 MED ORDER — ACETAMINOPHEN 325 MG PO TABS
650.0000 mg | ORAL_TABLET | Freq: Four times a day (QID) | ORAL | Status: DC | PRN
  Administered 2021-05-12 – 2021-05-13 (×2): 650 mg via ORAL
  Filled 2021-05-10 (×2): qty 2

## 2021-05-10 MED ORDER — PANTOPRAZOLE SODIUM 40 MG PO TBEC
40.0000 mg | DELAYED_RELEASE_TABLET | Freq: Every day | ORAL | Status: DC
  Administered 2021-05-11 – 2021-05-13 (×3): 40 mg via ORAL
  Filled 2021-05-10 (×3): qty 1

## 2021-05-10 MED ORDER — ALBUTEROL SULFATE (2.5 MG/3ML) 0.083% IN NEBU: 3.0000 mL | INHALATION_SOLUTION | RESPIRATORY_TRACT | Status: DC | PRN

## 2021-05-10 MED ORDER — GUAIFENESIN ER 600 MG PO TB12: 600.0000 mg | ORAL_TABLET | Freq: Every day | ORAL | Status: DC | PRN

## 2021-05-10 MED ORDER — APIXABAN 2.5 MG PO TABS
2.5000 mg | ORAL_TABLET | Freq: Two times a day (BID) | ORAL | Status: DC
  Administered 2021-05-10 – 2021-05-13 (×6): 2.5 mg via ORAL
  Filled 2021-05-10 (×6): qty 1

## 2021-05-10 MED ORDER — SODIUM CHLORIDE 0.9 % IV SOLN
2.0000 g | Freq: Once | INTRAVENOUS | Status: AC
  Administered 2021-05-10: 19:00:00 2 g via INTRAVENOUS
  Filled 2021-05-10: qty 2

## 2021-05-10 MED ORDER — ACETAMINOPHEN 650 MG RE SUPP: 650.0000 mg | Freq: Four times a day (QID) | RECTAL | Status: DC | PRN

## 2021-05-10 MED ORDER — SODIUM CHLORIDE 0.9 % IV SOLN
2.0000 g | INTRAVENOUS | Status: DC
  Administered 2021-05-11 – 2021-05-12 (×2): 2 g via INTRAVENOUS
  Filled 2021-05-10 (×2): qty 2

## 2021-05-10 MED ORDER — LATANOPROST 0.005 % OP SOLN
1.0000 [drp] | Freq: Every day | OPHTHALMIC | Status: DC
  Administered 2021-05-11 – 2021-05-12 (×3): 1 [drp] via OPHTHALMIC
  Filled 2021-05-10: qty 2.5

## 2021-05-10 MED ORDER — SODIUM CHLORIDE 0.9 % IV BOLUS
500.0000 mL | Freq: Once | INTRAVENOUS | Status: AC
  Administered 2021-05-10: 17:00:00 500 mL via INTRAVENOUS

## 2021-05-10 MED ORDER — DILTIAZEM HCL 25 MG/5ML IV SOLN: 10.0000 mg | Freq: Once | INTRAVENOUS | Status: DC

## 2021-05-10 MED ORDER — TAMSULOSIN HCL 0.4 MG PO CAPS
0.4000 mg | ORAL_CAPSULE | Freq: Every day | ORAL | Status: DC
  Administered 2021-05-11 – 2021-05-13 (×3): 0.4 mg via ORAL
  Filled 2021-05-10 (×3): qty 1

## 2021-05-10 MED ORDER — MIDODRINE HCL 5 MG PO TABS
5.0000 mg | ORAL_TABLET | Freq: Two times a day (BID) | ORAL | Status: DC
  Administered 2021-05-11 – 2021-05-13 (×6): 5 mg via ORAL
  Filled 2021-05-10 (×6): qty 1

## 2021-05-10 MED ORDER — FLUTICASONE FUROATE-VILANTEROL 200-25 MCG/ACT IN AEPB
1.0000 | INHALATION_SPRAY | Freq: Every day | RESPIRATORY_TRACT | Status: DC
  Administered 2021-05-11 – 2021-05-13 (×3): 1 via RESPIRATORY_TRACT
  Filled 2021-05-10: qty 28

## 2021-05-10 MED ORDER — SENNOSIDES-DOCUSATE SODIUM 8.6-50 MG PO TABS: 1.0000 | ORAL_TABLET | Freq: Every evening | ORAL | Status: DC | PRN

## 2021-05-10 MED ORDER — SODIUM CHLORIDE 0.9 % IV SOLN: INTRAVENOUS | Status: DC

## 2021-05-10 MED ORDER — FINASTERIDE 5 MG PO TABS
5.0000 mg | ORAL_TABLET | Freq: Every day | ORAL | Status: DC
  Administered 2021-05-11 – 2021-05-13 (×3): 5 mg via ORAL
  Filled 2021-05-10 (×4): qty 1

## 2021-05-10 MED ORDER — ACETAMINOPHEN 325 MG PO TABS
650.0000 mg | ORAL_TABLET | Freq: Once | ORAL | Status: AC
  Administered 2021-05-10: 18:00:00 650 mg via ORAL
  Filled 2021-05-10: qty 2

## 2021-05-10 MED ORDER — SODIUM CHLORIDE 0.9 % IV BOLUS
1000.0000 mL | Freq: Once | INTRAVENOUS | Status: AC
  Administered 2021-05-10: 18:00:00 1000 mL via INTRAVENOUS

## 2021-05-10 MED ORDER — SODIUM CHLORIDE 0.9% FLUSH
3.0000 mL | Freq: Two times a day (BID) | INTRAVENOUS | Status: DC
  Administered 2021-05-11 – 2021-05-13 (×4): 3 mL via INTRAVENOUS

## 2021-05-10 MED ORDER — ASPIRIN EC 325 MG PO TBEC
325.0000 mg | DELAYED_RELEASE_TABLET | Freq: Every day | ORAL | Status: DC
  Administered 2021-05-11 – 2021-05-13 (×3): 325 mg via ORAL
  Filled 2021-05-10 (×3): qty 1

## 2021-05-10 MED ORDER — SODIUM CHLORIDE 0.9 % IV BOLUS
1000.0000 mL | Freq: Once | INTRAVENOUS | Status: AC
  Administered 2021-05-10: 21:00:00 1000 mL via INTRAVENOUS

## 2021-05-10 MED ORDER — ATORVASTATIN CALCIUM 40 MG PO TABS
40.0000 mg | ORAL_TABLET | Freq: Every day | ORAL | Status: DC
  Administered 2021-05-11 – 2021-05-13 (×3): 40 mg via ORAL
  Filled 2021-05-10 (×3): qty 1

## 2021-05-10 MED ORDER — VANCOMYCIN HCL 1500 MG/300ML IV SOLN
1500.0000 mg | Freq: Once | INTRAVENOUS | Status: AC
  Administered 2021-05-10: 20:00:00 1500 mg via INTRAVENOUS
  Filled 2021-05-10: qty 300

## 2021-05-10 MED ORDER — AZELASTINE HCL 0.1 % NA SOLN
1.0000 | Freq: Every day | NASAL | Status: DC
  Administered 2021-05-11 – 2021-05-12 (×3): 1 via NASAL
  Filled 2021-05-10: qty 30

## 2021-05-10 MED ORDER — MIDODRINE HCL 5 MG PO TABS: 5.0000 mg | ORAL_TABLET | Freq: Two times a day (BID) | ORAL | Status: DC

## 2021-05-10 MED ORDER — FLUTICASONE PROPIONATE 50 MCG/ACT NA SUSP
2.0000 | Freq: Every day | NASAL | Status: DC
  Administered 2021-05-11 – 2021-05-12 (×3): 2 via NASAL
  Filled 2021-05-10: qty 16

## 2021-05-10 MED ORDER — TIMOLOL MALEATE 0.5 % OP SOLN
1.0000 [drp] | Freq: Every morning | OPHTHALMIC | Status: DC
  Administered 2021-05-11 – 2021-05-13 (×3): 1 [drp] via OPHTHALMIC
  Filled 2021-05-10: qty 5

## 2021-05-10 NOTE — Progress Notes (Signed)
Pharmacy Antibiotic Note  Charles Golden is a 87 y.o. male admitted on 05/10/2021 with UTI.  Pharmacy has been consulted for Cefepime dosing. WBC elevated, PCT 4.56. SCr elevated at 1.9.   Plan: -Start Cefepime 2 gm IV Q 24 hours -Monitor CBC, renal fx, cultures and clinical progress     Temp (24hrs), Avg:99.1 F (37.3 C), Min:98.1 F (36.7 C), Max:101.2 F (38.4 C)  Recent Labs  Lab 05/04/21 0119 05/05/21 0325 05/06/21 0337 05/10/21 1702 05/10/21 1709 05/10/21 1728 05/10/21 1928  WBC 8.6  --   --  15.7*  --   --   --   CREATININE 1.65* 1.45* 1.62* 2.05* 1.90*  --   --   LATICACIDVEN  --   --   --   --   --  1.9 1.3    Estimated Creatinine Clearance: 25.9 mL/min (A) (by C-G formula based on SCr of 1.9 mg/dL (H)).    Allergies  Allergen Reactions   Demerol [Meperidine] Other (See Comments)    Pt. States "he woke up during a colonoscopy"   Spiriva Handihaler [Tiotropium Bromide Monohydrate] Other (See Comments)    Mild Urinary Retention    Antimicrobials this admission: Cefepime 12/14 >>  Vancomyccin 12/14 x2  Dose adjustments this admission:   Microbiology results: 12/14 BCx:  12/14 UCx:     Thank you for allowing pharmacy to be a part of this patients care.  Albertina Parr, PharmD., BCPS, BCCCP Clinical Pharmacist Please refer to [facility] for unit-specific pharmacist

## 2021-05-10 NOTE — ED Triage Notes (Signed)
Pt reports having stent placed last week.  Pt has had increasing shob and feeling unwell since yesterday.  Pt BP is 13W systolic and  is tachycardic.  Charge RN called for room.

## 2021-05-10 NOTE — Plan of Care (Signed)
ON CALL PHONE CONSULT  Call from Dr. Yao@6 :20 PM   Discussion: Patient with a recent left MCA stroke and elective left carotid stenting on aspirin and Plavix now comes in with sepsis, pneumonia and new onset A. fib.  Need recommendation anticoagulation. Ideally should be on dual antiplatelets but given the A. fib and recent stroke, needs to be anticoagulated for stroke prevention. Discussed briefly with endovascular-Dr. Philomena Doheny. Okay to stop Plavix and continue aspirin plus Eliquis.  - -- Ladean Raya, MD Neurologist [facility] Pager: [phone]

## 2021-05-10 NOTE — ED Notes (Signed)
Pt's blood pressure noted be 52/40 when walking in the room. Rechecked BP and second one was 63/68. EDP Pffeifer notified and Lysbeth Galas, RN notified.

## 2021-05-10 NOTE — Chronic Care Management (AMB) (Signed)
Chronic Care Management   Note  05/10/2021 Name: Charles Golden MRN: [id number] DOB: 08/20/1925  Charles Golden is a 87 y.o. year old male who is a primary care patient of [name], DO. I reached out to [employer] by phone today in response to a referral sent by Charles Golden PCP.  [name] was given information about Chronic Care Management services today including:  CCM service includes personalized support from designated clinical staff supervised by his physician, including individualized plan of care and coordination with other care providers 24/7 contact phone numbers for assistance for urgent and routine care needs. Service will only be billed when office clinical staff spend 20 minutes or more in a month to coordinate care. Only one practitioner may furnish and bill the service in a calendar month. The patient may stop CCM services at any time (effective at the end of the month) by phone call to the office staff. The patient is responsible for co-pay (up to 20% after annual deductible is met) if co-pay is required by the individual health plan.   Patient agreed to services and verbal consent obtained.   Follow up plan: Telephone appointment with care management team member scheduled for: RN CM 05/11/2021 LCSW 05/19/2021  [name], [address], [place], [place] [address] Direct Dial: [phone] [email or web address]: [email or web address]

## 2021-05-10 NOTE — H&P (Signed)
History and Physical    Charles Golden:[id number] DOB: 1925/12/23 DOA: 05/10/2021  PCP: [name], DO   Patient coming from: Home   Chief Complaint: Shaking chills, N/V   HPI: Charles Golden is a pleasant 87 y.o. male with medical history significant for COPD, CKD 3B, BPH, and recent admission with recurrent ischemic CVA status post left carotid angioplasty and stent on 05/04/2021, presenting to the emergency department with shaking chills.  Patient's daughter has been staying with him for the past 2 weeks after his wife of 37 years died and notes that he seemed to be doing well until developing shaking chills and then vomited this afternoon.  Patient adds that he has had some burning with urination recently and difficulty voiding.  He required in and out cath multiple times during the recent hospitalization but had subsequently been voiding on his own.  He reports back pain that he attributes to recent hospitalization and does not involve the flanks but his midline low back without lower extremity weakness or numbness.  Aside from suprapubic discomfort, he denies any abdominal pain.  He had 1 episode of nausea with vomiting while experiencing rigors today.  He felt short of breath in triage in the ED when he was hypotensive and in new A. fib with rapid ventricular response, but denies any change in his chronic cough and notes that the dyspnea resolved.  ED Course: Upon arrival to the ED, patient is found to be febrile to 38.4 C, saturating mid to upper 90s on room air, tachycardic to 120s, and with blood pressure 87/54.  EKG features atrial fibrillation with RVR.  Chest x-ray notable for hazy opacity in the right lower lobe.  Chemistry panel with bicarbonate 18 and creatinine 2.05.  CBC notable for leukocytosis to 15,700.  High-sensitivity troponin was 47 and then 44.  Lactic acid reassuringly normal.  COVID and influenza PCR negative.  Blood cultures were collected and the patient was given  IV fluids, vancomycin, cefepime, acetaminophen, and Eliquis in the ED.  Review of Systems:  All other systems reviewed and apart from HPI, are negative.  Past Medical History:  Diagnosis Date   Bronchitis, chronic (HCC)    COPD (chronic obstructive pulmonary disease) (HCC)    Enlarged prostate    GERD (gastroesophageal reflux disease)    Glaucoma    Glucagonoma    Hernia, incisional    at present   [facility] (hard of hearing)    IBS (irritable bowel syndrome)    Sciatic pain    Sinus congestion     Past Surgical History:  Procedure Laterality Date   BOWEL RESECTION N/A 09/04/2012   Procedure: SMALL BOWEL RESECTION;  Surgeon: Donato Heinz, MD;  Location: [facility];  Service: General;  Laterality: N/A;  Anastimosis   HEMORRHOID SURGERY     HERNIA REPAIR Bilateral 70's   INGUINAL HERNIA REPAIR Left 09/14/2013   Procedure: LEFT INGUINAL HERNIORRHAPHY;  Surgeon: Jamesetta So, MD;  Location: [facility];  Service: General;  Laterality: Left;   INGUINAL HERNIA REPAIR Right 12/23/2018   Procedure: RECURRENT RIGHT INGUINAL HERNIA  REPAIR  WITH MESH;  Surgeon: Aviva Signs, MD;  Location: [facility];  Service: General;  Laterality: Right;   INSERTION OF MESH Left 09/14/2013   Procedure: INSERTION OF MESH;  Surgeon: Jamesetta So, MD;  Location: [facility];  Service: General;  Laterality: Left;   IR ANGIO INTRA EXTRACRAN SEL INTERNAL CAROTID UNI L MOD SED  05/04/2021  IR INTRAVSC STENT CERV CAROTID W/EMB-PROT MOD SED INCL ANGIO  05/04/2021   IR US GUIDE VASC ACCESS RIGHT  05/04/2021   KNEE ARTHROSCOPY Left 07/21/2014   Procedure: LEFT KNEE ARTHROSCOPY WITH MEDIAL MENISCAL DEBRIDEMENT ;  Surgeon: Gearlean Alf, MD;  Location: [facility];  Service: Orthopedics;  Laterality: Left;   LAPAROTOMY N/A 09/04/2012   Procedure: EXPLORATORY LAPAROTOMY;  Surgeon: Donato Heinz, MD;  Location: [facility];  Service: General;  Laterality: N/A;   RADIOLOGY WITH ANESTHESIA Left 05/04/2021   Procedure: LEFT ICA STENT;  Surgeon: Pedro Earls, MD;  Location: [place];  Service: Radiology;  Laterality: Left;   SKIN LESION EXCISION     [name]   TONSILLECTOMY      Social History:   reports that he quit smoking about 14 years ago. His smoking use included cigarettes. He has a 30.00 pack-year smoking history. He has never used smokeless tobacco. He reports that he does not currently use alcohol. He reports that he does not use drugs.  Allergies  Allergen Reactions   Demerol [Meperidine] Other (See Comments)    Pt. States "he woke up during a colonoscopy"   Spiriva Handihaler [Tiotropium Bromide Monohydrate] Other (See Comments)    Mild Urinary Retention    Family History  Problem Relation Age of Onset   Heart disease Mother        Valve replacement and pacemaker   Colon cancer Father    Asthma Other        cousin   Healthy Daughter    Healthy Daughter    Healthy Daughter      Prior to Admission medications   Medication Sig Start Date End Date Taking? Authorizing Provider  acetaminophen (TYLENOL) 325 MG tablet Take 2 tablets (650 mg total) by mouth every 4 (four) hours as needed for mild pain (or temp > 37.5 C (99.5 F)). Patient not taking: Reported on 05/08/2021 05/06/21   Domenic Polite, MD  aspirin EC 325 MG EC tablet Take 1 tablet (325 mg total) by mouth daily. 04/29/21   Ghimire, Henreitta Leber, MD  atorvastatin (LIPITOR) 40 MG tablet Take 1 tablet (40 mg total) by mouth daily. 04/29/21   Ghimire, Henreitta Leber, MD  azelastine (ASTELIN) 0.1 % nasal spray PLACE 1 SPRAY IN EACH NOSTRIL ONCE A DAY AS DIRECTED 01/17/21   Gottschalk, Ashly M, DO  BREO ELLIPTA 200-25 MCG/INH AEPB Inhale 1 puff into the lungs daily. 01/17/21   [name], DO  cephALEXin (KEFLEX) 500 MG capsule Take 1 capsule (500 mg total) by mouth 2 (two) times daily. 05/08/21   Ivy Lynn, NP  Cholecalciferol (VITAMIN D3) 2000 UNITS TABS Take 1 tablet by mouth daily.    [provider]  clopidogrel (PLAVIX) 75 MG  tablet Take 1 tablet (75 mg total) by mouth daily. 04/29/21   Ghimire, Henreitta Leber, MD  Cyanocobalamin (VITAMIN B-12) 5000 MCG SUBL Place 1 tablet under the tongue daily.    [provider]  finasteride (PROSCAR) 5 MG tablet Take 5 mg by mouth daily.    [provider]  fluticasone (FLONASE) 50 MCG/ACT nasal spray Place 2 sprays into both nostrils daily.    [provider]  guaiFENesin (MUCINEX) 600 MG 12 hr tablet Take 600 mg by mouth daily as needed for to loosen phlegm.    [provider]  latanoprost (XALATAN) 0.005 % ophthalmic solution Place 1 drop into the left eye at bedtime. Patient taking differently: Place  1 drop into both eyes at bedtime. 06/01/16   Chipper Herb, MD  midodrine (PROAMATINE) 5 MG tablet Take 1 tablet (5 mg total) by mouth 2 (two) times daily with a meal for 7 days. 05/06/21 05/13/21  Domenic Polite, MD  pantoprazole (PROTONIX) 40 MG tablet Take 1 tablet (40 mg total) by mouth daily. Patient not taking: Reported on 05/10/2021 04/28/21 04/28/22  Jonetta Osgood, MD  silodosin (RAPAFLO) 4 MG CAPS capsule Take 4 mg by mouth daily with breakfast.    [provider]  timolol (TIMOPTIC) 0.5 % ophthalmic solution Place 1 drop into the left eye every morning. 11/24/18   [provider]  VENTOLIN HFA 108 (90 Base) MCG/ACT inhaler INHALE 2 PUFFS EVERY 6 HOURS AS NEEDED FOR WHEEZING OR SHORTNESS OF BREATH Patient taking differently: 1 puff every 6 (six) hours as needed for shortness of breath. 02/28/21   [name], DO    Physical Exam: Vitals:   05/10/21 2040 05/10/21 2045 05/10/21 2050 05/10/21 2100  BP: (!) 85/58 (!) 87/56 (!) 105/58 118/65  Pulse: 78 79 79 80  Resp: 17 18 16  (!) 21  Temp:      TempSrc:      SpO2: 96% 93% 96% 97%    Constitutional: NAD, calm  Eyes: PERTLA, lids and conjunctivae normal ENMT: Mucous membranes are moist. Posterior pharynx clear of any exudate or lesions.   Neck: supple, no  masses  Respiratory:  no wheezing, no crackles. No accessory muscle use.  Cardiovascular: S1 & S2 heard, regular rate and rhythm. No extremity edema.  Abdomen: No distension, soft, suprapubic tenderness, no upper abdominal tenderness. Bowel sounds active.  Musculoskeletal: no clubbing / cyanosis. No joint deformity upper and lower extremities.   Skin: no significant rashes, lesions, ulcers. Warm, dry, well-perfused. Neurologic: CN 2-12 grossly intact aside from hearing deficit. Moving all extremities. Alert and oriented.  Psychiatric: Very pleasant. Cooperative.    Labs and Imaging on Admission: I have personally reviewed following labs and imaging studies  CBC: Recent Labs  Lab 05/04/21 0119 05/10/21 1702 05/10/21 1709  WBC 8.6 15.7*  --   NEUTROABS  --  13.9*  --   HGB 13.2 13.2 13.9  HCT 40.1 41.1 41.0  MCV 93.5 95.4  --   PLT 242 331  --    Basic Metabolic Panel: Recent Labs  Lab 05/04/21 0119 05/05/21 0325 05/06/21 0337 05/10/21 1702 05/10/21 1709  NA 135 135 134* 137 136  K 4.0 4.3 3.9 4.4 4.3  CL 105 103 104 107 107  CO2 20* 22 22 18*  --   GLUCOSE 91 103* 120* 113* 113*  BUN 19 17 14 21 23   CREATININE 1.65* 1.45* 1.62* 2.05* 1.90*  CALCIUM 8.5* 8.0* 8.2* 9.1  --   MG 1.9  --   --   --   --    GFR: Estimated Creatinine Clearance: 25.9 mL/min (A) (by C-G formula based on SCr of 1.9 mg/dL (H)). Liver Function Tests: Recent Labs  Lab 05/10/21 1702  AST 22  ALT 18  ALKPHOS 86  BILITOT 0.8  PROT 6.4*  ALBUMIN 3.4*   No results for input(s): LIPASE, AMYLASE in the last 168 hours. No results for input(s): AMMONIA in the last 168 hours. Coagulation Profile: No results for input(s): INR, PROTIME in the last 168 hours. Cardiac Enzymes: No results for input(s): CKTOTAL, CKMB, CKMBINDEX, TROPONINI in the last 168 hours. BNP (last 3 results) No results for input(s): PROBNP in  the last 8760 hours. HbA1C: No results for input(s): HGBA1C in the last 72  hours. CBG: No results for input(s): GLUCAP in the last 168 hours. Lipid Profile: No results for input(s): CHOL, HDL, LDLCALC, TRIG, CHOLHDL, LDLDIRECT in the last 72 hours. Thyroid Function Tests: No results for input(s): TSH, T4TOTAL, FREET4, T3FREE, THYROIDAB in the last 72 hours. Anemia Panel: No results for input(s): VITAMINB12, FOLATE, FERRITIN, TIBC, IRON, RETICCTPCT in the last 72 hours. Urine analysis:    Component Value Date/Time   COLORURINE YELLOW 05/02/2021 0055   APPEARANCEUR CLEAR 05/02/2021 0055   APPEARANCEUR Clear 01/31/2016 1513   LABSPEC <1.005 (L) 05/02/2021 0055   PHURINE 6.0 05/02/2021 0055   GLUCOSEU NEGATIVE 05/02/2021 0055   HGBUR NEGATIVE 05/02/2021 0055   BILIRUBINUR NEGATIVE 05/02/2021 0055   BILIRUBINUR Negative 01/31/2016 1513   KETONESUR NEGATIVE 05/02/2021 0055   PROTEINUR NEGATIVE 05/02/2021 0055   UROBILINOGEN 0.2 09/04/2012 1334   NITRITE NEGATIVE 05/02/2021 0055   LEUKOCYTESUR NEGATIVE 05/02/2021 0055   Sepsis Labs: @LABRCNTIP (procalcitonin:4,lacticidven:4) ) Recent Results (from the past 240 hour(s))  MRSA Next Gen by PCR, Nasal     Status: None   Collection Time: 05/02/21 11:47 AM   Specimen: Nasal Mucosa; Nasal Swab  Result Value Ref Range Status   MRSA by PCR Next Gen NOT DETECTED NOT DETECTED Final    Comment: (NOTE) The GeneXpert MRSA Assay (FDA approved for NASAL specimens only), is one component of a comprehensive MRSA colonization surveillance program. It is not intended to diagnose MRSA infection nor to guide or monitor treatment for MRSA infections. Test performance is not FDA approved in patients less than 46 years old. Performed at [facility], [place] [address] [place], [place] [address]   Resp Panel by RT-PCR (Flu A&B, Covid) Nasopharyngeal Swab     Status: None   Collection Time: 05/03/21  9:09 AM   Specimen: Nasopharyngeal Swab; Nasopharyngeal(NP) swabs in vial transport medium  Result Value Ref Range Status    SARS Coronavirus 2 by RT PCR NEGATIVE NEGATIVE Final    Comment: (NOTE) SARS-CoV-2 target nucleic acids are NOT DETECTED.  The SARS-CoV-2 RNA is generally detectable in upper respiratory specimens during the acute phase of infection. The lowest concentration of SARS-CoV-2 viral copies this assay can detect is 138 copies/mL. A negative result does not preclude SARS-Cov-2 infection and should not be used as the sole basis for treatment or other patient management decisions. A negative result may occur with  improper specimen collection/handling, submission of specimen other than nasopharyngeal swab, presence of viral mutation(s) within the areas targeted by this assay, and inadequate number of viral copies(<138 copies/mL). A negative result must be combined with clinical observations, patient history, and epidemiological information. The expected result is Negative.  Fact Sheet for Patients:  EntrepreneurPulse.com.au  Fact Sheet for Healthcare Providers:  IncredibleEmployment.be  This test is no t yet approved or cleared by the [place] FDA and  has been authorized for detection and/or diagnosis of SARS-CoV-2 by FDA under an Emergency Use Authorization (EUA). This EUA will remain  in effect (meaning this test can be used) for the duration of the COVID-19 declaration under Section 564(b)(1) of the Act, 21 U.S.C.section 360bbb-3(b)(1), unless the authorization is terminated  or revoked sooner.       Influenza A by PCR NEGATIVE NEGATIVE Final   Influenza B by PCR NEGATIVE NEGATIVE Final    Comment: (NOTE) The Xpert Xpress SARS-CoV-2/FLU/RSV plus assay is intended as an aid in the diagnosis  of influenza from Nasopharyngeal swab specimens and should not be used as a sole basis for treatment. Nasal washings and aspirates are unacceptable for Xpert Xpress SARS-CoV-2/FLU/RSV testing.  Fact Sheet for  Patients: EntrepreneurPulse.com.au  Fact Sheet for Healthcare Providers: IncredibleEmployment.be  This test is not yet approved or cleared by the [place] FDA and has been authorized for detection and/or diagnosis of SARS-CoV-2 by FDA under an Emergency Use Authorization (EUA). This EUA will remain in effect (meaning this test can be used) for the duration of the COVID-19 declaration under Section 564(b)(1) of the Act, 21 U.S.C. section 360bbb-3(b)(1), unless the authorization is terminated or revoked.  Performed at [facility], [place] [address], [place], [place] [address]   Resp Panel by RT-PCR (Flu A&B, Covid) Nasopharyngeal Swab     Status: None   Collection Time: 05/10/21  5:02 PM   Specimen: Nasopharyngeal Swab; Nasopharyngeal(NP) swabs in vial transport medium  Result Value Ref Range Status   SARS Coronavirus 2 by RT PCR NEGATIVE NEGATIVE Final    Comment: (NOTE) SARS-CoV-2 target nucleic acids are NOT DETECTED.  The SARS-CoV-2 RNA is generally detectable in upper respiratory specimens during the acute phase of infection. The lowest concentration of SARS-CoV-2 viral copies this assay can detect is 138 copies/mL. A negative result does not preclude SARS-Cov-2 infection and should not be used as the sole basis for treatment or other patient management decisions. A negative result may occur with  improper specimen collection/handling, submission of specimen other than nasopharyngeal swab, presence of viral mutation(s) within the areas targeted by this assay, and inadequate number of viral copies(<138 copies/mL). A negative result must be combined with clinical observations, patient history, and epidemiological information. The expected result is Negative.  Fact Sheet for Patients:  EntrepreneurPulse.com.au  Fact Sheet for Healthcare Providers:  IncredibleEmployment.be  This test is no t yet  approved or cleared by the [place] FDA and  has been authorized for detection and/or diagnosis of SARS-CoV-2 by FDA under an Emergency Use Authorization (EUA). This EUA will remain  in effect (meaning this test can be used) for the duration of the COVID-19 declaration under Section 564(b)(1) of the Act, 21 U.S.C.section 360bbb-3(b)(1), unless the authorization is terminated  or revoked sooner.       Influenza A by PCR NEGATIVE NEGATIVE Final   Influenza B by PCR NEGATIVE NEGATIVE Final    Comment: (NOTE) The Xpert Xpress SARS-CoV-2/FLU/RSV plus assay is intended as an aid in the diagnosis of influenza from Nasopharyngeal swab specimens and should not be used as a sole basis for treatment. Nasal washings and aspirates are unacceptable for Xpert Xpress SARS-CoV-2/FLU/RSV testing.  Fact Sheet for Patients: EntrepreneurPulse.com.au  Fact Sheet for Healthcare Providers: IncredibleEmployment.be  This test is not yet approved or cleared by the [place] FDA and has been authorized for detection and/or diagnosis of SARS-CoV-2 by FDA under an Emergency Use Authorization (EUA). This EUA will remain in effect (meaning this test can be used) for the duration of the COVID-19 declaration under Section 564(b)(1) of the Act, 21 U.S.C. section 360bbb-3(b)(1), unless the authorization is terminated or revoked.  Performed at [facility], [place] [address], [place], [place] [address]      Radiological Exams on Admission: DG Chest Port 1 View  Result Date: 05/10/2021 CLINICAL DATA:  Shortness of breath EXAM: PORTABLE CHEST 1 VIEW COMPARISON:  None. FINDINGS: The heart is normal in size. Atherosclerotic calcification of aortic arch. Right lower lobe hazy pulmonary opacities  concerning for airspace disease. No pleural effusion or pneumothorax. IMPRESSION: Right lower lobe hazy lung opacities concerning for pneumonia. Follow-up examination to  resolution is recommended. Electronically Signed   By: Keane Police D.O.   On: 05/10/2021 17:48    EKG: Independently reviewed. Atrial fibrillation, rate 123.   Assessment/Plan   1. Sepsis suspected secondary to UTI  - Presents with rigors and found to have SBP 80s, HR 120s, WBC 15,700, and recent urinary sxs  - Pt reports recent dysuria and difficulty voiding and the clinical presentation most consistent with bacteremia from urinary source   - There was hazy opacity in RLL on CXR but pt denies change in chronic cough and is not tachypneic or hypoxic  - Blood cultures collected in ED and he was given 2.5 liters IVF, vancomycin, and cefepime  - Collect UA and culture, continue cefepime, follow cultures and clinical course   2. New atrial fibrillation with RVR  - Patient was in a fib with rate 120s in ED  - Pt and family deny hx of a fib, there was no significant valvular disease or left atrial enlargement on recent echo, and this was likely precipitated by sepsis  - He was started on Eliquis in ED and converted back to [place] at least 5 (age x2, CVA x2, vascular disease)  - Continue Eliquis, continue cardiac monitoring   3. Hx of CVA; CAS s/p ICA stent  - Recently admitted with recurrent CVA and underwent left carotid angioplasty and stenting on 05/04/21 by NIR  - Continue ASA, stop Plavix, and start Eliquis per ED discussion with neurology (who discussed with NIR)    4. AKI superimposed on CKD IIIb  - SCr is 2.05 on admission, up from 1.62 four days earlier  - Likely acute prerenal azotemia in setting of sepsis though he has BPH and was requiring I&O cath during recent hospitalization and obstructive uropathy also considered  - Continue IVF hydration, check bladder scan and I&O cath if needed, check FENa, renally-dose medications, repeat serum chemistries in am and image kidneys if failing to return to baseline with IVF as expected    5. BPH  - Continue tamsulosin and  finasteride    6. Hypotension  - Started on midodrine during recent hospitalization and was going to start trying to taper off with PCP  - There were some low BP readings in ED in setting of suspected sepsis  - Continue midodrine for now   7. COPD  - Not in exacerbation, continue ICS/LABA and as-needed SABA    DVT prophylaxis: Eliquis  Code Status: Full. Discussed with patient and his daughter in ED; pt initially wanted DNR but his daughter convinced him to allow trial of heroic measures though both agree no prolonged life support if prognosis poor.  Level of Care: Level of care: Progressive Family Communication: Daughter at bedside  Disposition Plan:  Patient is from: home  Anticipated d/c is to: TBD Anticipated d/c date is: 05/13/21  Patient currently: Pending cultures, stable BP, stable renal function  Consults called: none  Admission status: Inpatient     Vianne Bulls, MD [facility]  05/10/2021, 9:42 PM

## 2021-05-10 NOTE — Progress Notes (Signed)
ANTICOAGULATION CONSULT NOTE - Initial Consult  Pharmacy Consult for apixaban Indication: atrial fibrillation  Allergies  Allergen Reactions   Demerol [Meperidine] Other (See Comments)    Pt. States "he woke up during a colonoscopy"   Spiriva Handihaler [Tiotropium Bromide Monohydrate] Other (See Comments)    Mild Urinary Retention    Patient Measurements: Weight: 76.9 kg   Vital Signs: Temp: 101.2 F (38.4 C) (12/14 1743) Temp Source: Rectal (12/14 1743) BP: 116/63 (12/14 1815) Pulse Rate: 80 (12/14 1815)  Labs: Recent Labs    05/10/21 1702 05/10/21 1709  HGB 13.2 13.9  HCT 41.1 41.0  PLT 331  --   CREATININE 2.05* 1.90*  TROPONINIHS 47*  --     Estimated Creatinine Clearance: 25.9 mL/min (A) (by C-G formula based on SCr of 1.9 mg/dL (H)).   Medical History: Past Medical History:  Diagnosis Date   Bronchitis, chronic (HCC)    COPD (chronic obstructive pulmonary disease) (HCC)    Enlarged prostate    GERD (gastroesophageal reflux disease)    Glaucoma    Glucagonoma    Hernia, incisional    at present   [facility] (hard of hearing)    IBS (irritable bowel syndrome)    Sciatic pain    Sinus congestion     Medications:  (Not in a hospital admission)   Assessment: 57 YOM who presents with SOB and chills. Pt had a recent L MCA stroke s/p elective L carotid stenting on aspirin and plavix PTA. Plavix was stopped during this admission and pharmacy consulted to start apixaban.   H/H and Plt wnl, SCr 1.9  Goal of Therapy:  Stroke prophylaxis Monitor platelets by anticoagulation protocol: Yes   Plan:  -Start apixaban 2.5 mg twice daily (Age > 90, SCr > 1.5) -Monitor CBC, renal fx and s/s of bleeding   Albertina Parr, PharmD., BCPS, BCCCP Clinical Pharmacist Please refer to [facility] for unit-specific pharmacist

## 2021-05-10 NOTE — Patient Instructions (Addendum)
TOPICAL Voltaren gel ok to put on the spot on your arm .  Melatonin 3mg  at bedtime to help with rest if needed.  Scott and Cyril Mourning will call to assist as well.  For midodrine: 1/2 tablet twice daily x2 days.  Then 1/2 tablet daily x2 days. Then stop  Phlebitis Phlebitis is soreness and swelling (inflammation) of a vein. In most cases, this condition is not serious and gets better quickly. What are the causes? This condition may be caused by: Having any of these put in the vein: A needle. An IV tube. A long, thin tube called a catheter. Getting certain IV medicines or solutions that can irritate the vein. Having an IV tube for a long time or in a part of the body that moves a lot. A blood clot. Infection of the vein. Surgery on a vein. What increases the risk? Being overweight or very overweight (obese). Pregnancy. Cancer. Reduced or slowing of blood flow through your veins. This can be caused by: Bed rest over a long period of time. Long-distance travel. Injury. Surgery. Congestive heart failure. Inactivity. Smoking. Taking birth control pills or hormone replacement therapy. Varicose veins. Diseases or blood disorders that increase clotting. Taking drugs through the vein. Having a history of blood clots. What are the signs or symptoms? A red, tender, swollen, and painful area on your skin. Usually, the area is long and narrow. It may spread. The affected area feeling warm to the touch. Firmness along the center of the affected area. Low fever. How is this treated? Treatment may include: Using a heating pad or other heat source. Wearing stockings or bandages that put pressure (compression) on the area. Taking medicines. Removing an IV tube. Changing a medicine or IV solution that is causing the problem. In rare cases, surgery may be needed. Follow these instructions at home: Managing pain, stiffness, and swelling  If told, put heat on the affected area. Do this as  often as told by your doctor. Use the heat source that your doctor recommends, such as a moist heat pack or a heating pad. Place a towel between your skin and the heat source. Leave the heat on for 20-30 minutes. Take off the heat if your skin turns bright red. This is very important. If you cannot feel pain, heat, or cold, you have a greater risk of getting burned. Raise the affected area above the level of your heart while you are sitting or lying down. Medicines Take over-the-counter and prescription medicines only as told by your doctor. If you were prescribed an antibiotic medicine, take it as told by your doctor. Do not stop taking it even if you start to feel better. If you are taking blood thinners: Talk with your doctor before taking any medicines that have aspirin or NSAIDs, such as ibuprofen. Take medicines exactly as told. Take them at the same time each day. Avoid doing things that could hurt or bruise you. Take action to prevent falls. Wear an alert bracelet or carry a card that shows you are taking blood thinners. General instructions If you have phlebitis in your legs: Do not sit or lie down for a long time. Keep your legs moving. Get up and take short walks if you have to sit for a long time. Try to avoid bed rest that lasts for a long time. Regular sleep is not bed rest. Wear compression stockings or bandages as told by your doctor. These stockings help: To reduce swelling in your legs. To prevent  blood clots. To stop the condition from coming back. Do not smoke or use any products that contain nicotine or tobacco. If you need help quitting, ask your doctor. Keep all follow-up visits. Contact a doctor if: You have strange bruises. You have bleeding problems. Your symptoms do not get better. Your symptoms get worse. You are taking medicine to treat swelling (anti-inflammatory medicine) and you get pain in your belly (abdomen). Get help right away if: You have sudden  chest pain. You suddenly have trouble breathing. You have a fever and your symptoms get worse. You cough up blood. You feel dizzy or you faint. You have very bad pain and swelling in the affected arm or leg. These symptoms may be an emergency. Get help right away. Call your local emergency services (911 in the [place] Do not wait to see if the symptoms will go away. Do not drive yourself to the hospital. Summary Phlebitis is soreness and swelling (inflammation) of a vein. Raise the affected area above the level of your heart while you are sitting or lying down. If told, put heat on the affected area. Do this as often as told by your doctor. Take over-the-counter and prescription medicines only as told by your doctor. This information is not intended to replace advice given to you by your health care provider. Make sure you discuss any questions you have with your health care provider. Document Revised: 01/11/2020 Document Reviewed: 01/11/2020 Elsevier Patient Education  2022 [employer].

## 2021-05-10 NOTE — ED Provider Notes (Signed)
[facility] EMERGENCY DEPARTMENT Provider Note   CSN: [id number] Arrival date & time: 05/10/21  1552    History Chief Complaint  Patient presents with   Shortness of Breath    Charles Golden is a 87 y.o. male with recent left ICA stenosis status post stent placement on 12/8 who presents to the ED today with complaints of shortness of breath and chills.  The patient is here today in the care of his daughter.  The patient initially did well after stent placement.  Between then and now, the patient saw his doctor on Monday for phlebitis from his IV site during his last hospitalization.  At that time, he was given a Keflex regimen which she has been compliant with and last took today.  However, he saw his primary care provider earlier today who actually did not think this was phlebitis and recommended discontinuation of antibiotics.    After this appointment, the patient was in his usual state of health until his daughter noticed around 2 or 2:30 PM that he was having chills and was visibly shaking.  Also had difficulty breathing, but the patient has trouble describing the shortness of breath he had earlier today.  No seizure activity noted.  The patient subsequently vomited (NBNB), prompting them to present here.  Endorses some burning with urination.  He has not sustained a fall.  He has been compliant with DAPT since his stroke a couple weeks ago.  Denies chest pain.  No other complaints or concerns.  Shortness of Breath Associated symptoms: vomiting   Associated symptoms: no chest pain       Past Medical History:  Diagnosis Date   Bronchitis, chronic (HCC)    COPD (chronic obstructive pulmonary disease) (HCC)    Enlarged prostate    GERD (gastroesophageal reflux disease)    Glaucoma    Glucagonoma    Hernia, incisional    at present   [facility] (hard of hearing)    IBS (irritable bowel syndrome)    Sciatic pain    Sinus congestion     Patient Active Problem List    Diagnosis Date Noted   Skin infection 05/08/2021   Protein-calorie malnutrition, severe 05/05/2021   CVA (cerebral vascular accident) ([place]) 05/01/2021   Stenosis of left carotid artery    Acute ischemic stroke ([place]) 04/29/2021   TIA (transient ischemic attack) 04/27/2021   Chronic tension-type headache, not intractable 12/13/2020   Bilateral sensorineural hearing loss 09/25/2019   Bronchiectasis without complication ([place]) 40/12/6759   Fatigue 05/27/2019   Vitamin D deficiency 01/21/2019   Weakness 01/21/2019   Recurrent right inguinal hernia    Presbycusis of both ears 07/19/2017   Restless leg syndrome 11/25/2016   Collagenous colitis 07/24/2016   Deviated nasal septum 09/03/2015   Basal cell carcinoma of nose 09/03/2015   Thoracic aortic atherosclerosis ([place]) 05/03/2015   Upper airway cough syndrome 04/28/2015   GERD without esophagitis 04/11/2015   Acute medial meniscal tear 07/20/2014   COPD GOLD I  12/08/2013   Small bowel volvulus ([place]) 08/05/2013   Chronic headache 08/05/2013   Left inguinal hernia 08/05/2013   Incisional hernia, without obstruction or gangrene 08/05/2013   Headache 05/01/2013   Obstructive chronic bronchitis without exacerbation ([place]) 04/30/2013   Left hip pain 04/29/2013   BPH (benign prostatic hyperplasia) 01/05/2013   IBS (irritable bowel syndrome) 09/03/2012   Chronic rhinosinusitis 09/03/2012   Lung nodules 05/04/2003    Past Surgical History:  Procedure Laterality Date  BOWEL RESECTION N/A 09/04/2012   Procedure: SMALL BOWEL RESECTION;  Surgeon: Donato Heinz, MD;  Location: [facility];  Service: General;  Laterality: N/A;  Anastimosis   HEMORRHOID SURGERY     HERNIA REPAIR Bilateral 70's   INGUINAL HERNIA REPAIR Left 09/14/2013   Procedure: LEFT INGUINAL HERNIORRHAPHY;  Surgeon: Jamesetta So, MD;  Location: [facility];  Service: General;  Laterality: Left;   INGUINAL HERNIA REPAIR Right 12/23/2018   Procedure: RECURRENT RIGHT INGUINAL HERNIA   REPAIR  WITH MESH;  Surgeon: Aviva Signs, MD;  Location: [facility];  Service: General;  Laterality: Right;   INSERTION OF MESH Left 09/14/2013   Procedure: INSERTION OF MESH;  Surgeon: Jamesetta So, MD;  Location: [facility];  Service: General;  Laterality: Left;   IR ANGIO INTRA EXTRACRAN SEL INTERNAL CAROTID UNI L MOD SED  05/04/2021   IR INTRAVSC STENT CERV CAROTID W/EMB-PROT MOD SED INCL ANGIO  05/04/2021   IR US GUIDE VASC ACCESS RIGHT  05/04/2021   KNEE ARTHROSCOPY Left 07/21/2014   Procedure: LEFT KNEE ARTHROSCOPY WITH MEDIAL MENISCAL DEBRIDEMENT ;  Surgeon: Gearlean Alf, MD;  Location: [facility];  Service: Orthopedics;  Laterality: Left;   LAPAROTOMY N/A 09/04/2012   Procedure: EXPLORATORY LAPAROTOMY;  Surgeon: Donato Heinz, MD;  Location: [facility];  Service: General;  Laterality: N/A;   RADIOLOGY WITH ANESTHESIA Left 05/04/2021   Procedure: LEFT ICA STENT;  Surgeon: Pedro Earls, MD;  Location: [facility];  Service: Radiology;  Laterality: Left;   SKIN LESION EXCISION     [name]   TONSILLECTOMY         Family History  Problem Relation Age of Onset   Heart disease Mother        Valve replacement and pacemaker   Colon cancer Father    Asthma Other        cousin   Healthy Daughter    Healthy Daughter    Healthy Daughter     Social History   Tobacco Use   Smoking status: Former    Packs/day: 1.00    Years: 30.00    Pack years: 30.00    Types: Cigarettes    Quit date: 05/28/2006    Years since quitting: 14.9   Smokeless tobacco: Never  Vaping Use   Vaping Use: Never used  Substance Use Topics   Alcohol use: Not Currently    Alcohol/week: 0.0 standard drinks   Drug use: No    Home Medications Prior to Admission medications   Medication Sig Start Date End Date Taking? Authorizing Provider  acetaminophen (TYLENOL) 325 MG tablet Take 2 tablets (650 mg total) by mouth every 4 (four) hours as needed for mild pain (or temp > 37.5 C (99.5 F)). Patient not  taking: Reported on 05/08/2021 05/06/21   Domenic Polite, MD  aspirin EC 325 MG EC tablet Take 1 tablet (325 mg total) by mouth daily. 04/29/21   Ghimire, Henreitta Leber, MD  atorvastatin (LIPITOR) 40 MG tablet Take 1 tablet (40 mg total) by mouth daily. 04/29/21   Ghimire, Henreitta Leber, MD  azelastine (ASTELIN) 0.1 % nasal spray PLACE 1 SPRAY IN EACH NOSTRIL ONCE A DAY AS DIRECTED 01/17/21   Gottschalk, Ashly M, DO  BREO ELLIPTA 200-25 MCG/INH AEPB Inhale 1 puff into the lungs daily. 01/17/21   [name], DO  cephALEXin (KEFLEX) 500 MG capsule Take 1 capsule (500 mg total) by mouth 2 (two) times daily. 05/08/21   Jac Canavan  M, NP  Cholecalciferol (VITAMIN D3) 2000 UNITS TABS Take 1 tablet by mouth daily.    [provider]  clopidogrel (PLAVIX) 75 MG tablet Take 1 tablet (75 mg total) by mouth daily. 04/29/21   Ghimire, Henreitta Leber, MD  Cyanocobalamin (VITAMIN B-12) 5000 MCG SUBL Place 1 tablet under the tongue daily.    [provider]  finasteride (PROSCAR) 5 MG tablet Take 5 mg by mouth daily.    [provider]  fluticasone (FLONASE) 50 MCG/ACT nasal spray Place 2 sprays into both nostrils daily.    [provider]  guaiFENesin (MUCINEX) 600 MG 12 hr tablet Take 600 mg by mouth daily as needed for to loosen phlegm.    [provider]  latanoprost (XALATAN) 0.005 % ophthalmic solution Place 1 drop into the left eye at bedtime. Patient taking differently: Place 1 drop into both eyes at bedtime. 06/01/16   Chipper Herb, MD  midodrine (PROAMATINE) 5 MG tablet Take 1 tablet (5 mg total) by mouth 2 (two) times daily with a meal for 7 days. 05/06/21 05/13/21  Domenic Polite, MD  pantoprazole (PROTONIX) 40 MG tablet Take 1 tablet (40 mg total) by mouth daily. Patient not taking: Reported on 05/10/2021 04/28/21 04/28/22  Jonetta Osgood, MD  silodosin (RAPAFLO) 4 MG CAPS capsule Take 4 mg by mouth daily with breakfast.    [provider]  timolol  (TIMOPTIC) 0.5 % ophthalmic solution Place 1 drop into the left eye every morning. 11/24/18   [provider]  VENTOLIN HFA 108 (90 Base) MCG/ACT inhaler INHALE 2 PUFFS EVERY 6 HOURS AS NEEDED FOR WHEEZING OR SHORTNESS OF BREATH Patient taking differently: 1 puff every 6 (six) hours as needed for shortness of breath. 02/28/21   [name], DO    Allergies    Demerol [meperidine] and Spiriva handihaler [tiotropium bromide monohydrate]  Review of Systems   Review of Systems  Constitutional:  Positive for chills.  HENT: Negative.    Eyes: Negative.   Respiratory:  Positive for shortness of breath.   Cardiovascular:  Negative for chest pain and leg swelling.  Gastrointestinal:  Positive for vomiting. Negative for blood in stool.  Endocrine: Negative.   Genitourinary:  Positive for dysuria.  Musculoskeletal: Negative.   Skin: Negative.   Allergic/Immunologic: Negative.   Neurological: Negative.   Hematological: Negative.   Psychiatric/Behavioral: Negative.     Physical Exam Updated Vital Signs BP (!) 108/55    Pulse 82    Temp (!) 101.2 F (38.4 C) (Rectal)    Resp 15    SpO2 96%   Physical Exam Constitutional:      General: He is not in acute distress.    Appearance: He is well-developed.  HENT:     Head: Normocephalic and atraumatic.  Cardiovascular:     Rate and Rhythm: Normal rate. Rhythm irregular.     Heart sounds: No murmur heard.   No friction rub. No gallop.  Pulmonary:     Effort: Pulmonary effort is normal.     Breath sounds: Normal breath sounds. No wheezing, rhonchi or rales.  Chest:     Chest wall: No tenderness.  Abdominal:     General: Abdomen is flat. There is no distension.     Tenderness: There is no abdominal tenderness.  Musculoskeletal:        General: No swelling. Normal range of motion.  Skin:    General: Skin is warm and dry.     Comments:  Right arm: There is a small, raised, erythematous and edematous area on the anterior aspect  of the forearm that is tender to palpation.  Neurological:     General: No focal deficit present.     Mental Status: He is alert and oriented to person, place, and time. Mental status is at baseline.     Cranial Nerves: No cranial nerve deficit.     Sensory: No sensory deficit.     Motor: No weakness.  Psychiatric:        Mood and Affect: Mood normal.        Behavior: Behavior normal.    ED Results / Procedures / Treatments   Labs (all labs ordered are listed, but only abnormal results are displayed) Labs Reviewed  CBC WITH DIFFERENTIAL/PLATELET - Abnormal; Notable for the following components:      Result Value   WBC 15.7 (*)    Neutro Abs 13.9 (*)    Abs Immature Granulocytes 0.09 (*)    All other components within normal limits  COMPREHENSIVE METABOLIC PANEL - Abnormal; Notable for the following components:   CO2 18 (*)    Glucose, Bld 113 (*)    Creatinine, Ser 2.05 (*)    Total Protein 6.4 (*)    Albumin 3.4 (*)    GFR, Estimated 30 (*)    All other components within normal limits  BRAIN NATRIURETIC PEPTIDE - Abnormal; Notable for the following components:   B Natriuretic Peptide 105.7 (*)    All other components within normal limits  I-STAT CHEM 8, ED - Abnormal; Notable for the following components:   Creatinine, Ser 1.90 (*)    Glucose, Bld 113 (*)    Calcium, Ion 1.03 (*)    TCO2 20 (*)    All other components within normal limits  TROPONIN I (HIGH SENSITIVITY) - Abnormal; Notable for the following components:   Troponin I (High Sensitivity) 47 (*)    All other components within normal limits  RESP PANEL BY RT-PCR (FLU A&B, COVID) ARPGX2  URINE CULTURE  CULTURE, BLOOD (ROUTINE X 2)  CULTURE, BLOOD (ROUTINE X 2)  LACTIC ACID, PLASMA  URINALYSIS, ROUTINE W REFLEX MICROSCOPIC  LACTIC ACID, PLASMA  TROPONIN I (HIGH SENSITIVITY)    EKG EKG Interpretation  Date/Time:  Wednesday May 10 2021 16:43:58 EST Ventricular Rate:  123 PR Interval:  139 QRS  Duration: 85 QT Interval:  314 QTC Calculation: 450 R Axis:   -66 Text Interpretation: afib with RVR Left axis deviation Anteroseptal infarct, age indeterminate afib new since previous Confirmed by Wandra Arthurs ([id number]) on 05/10/2021 4:59:08 PM  Radiology DG Chest Port 1 View  Result Date: 05/10/2021 CLINICAL DATA:  Shortness of breath EXAM: PORTABLE CHEST 1 VIEW COMPARISON:  None. FINDINGS: The heart is normal in size. Atherosclerotic calcification of aortic arch. Right lower lobe hazy pulmonary opacities concerning for airspace disease. No pleural effusion or pneumothorax. IMPRESSION: Right lower lobe hazy lung opacities concerning for pneumonia. Follow-up examination to resolution is recommended. Electronically Signed   By: Keane Police D.O.   On: 05/10/2021 17:48    Procedures Procedures   Medications Ordered in ED Medications  vancomycin (VANCOREADY) IVPB 1500 mg/300 mL (1,500 mg Intravenous New Bag/Given 05/10/21 1938)  apixaban (ELIQUIS) tablet 2.5 mg (2.5 mg Oral Given 05/10/21 1935)  sodium chloride 0.9 % bolus 500 mL (0 mLs Intravenous Stopped 05/10/21 1736)  sodium chloride 0.9 % bolus 1,000 mL (0 mLs Intravenous Stopped 05/10/21 1938)  acetaminophen (TYLENOL) tablet 650  mg (650 mg Oral Given 05/10/21 1822)  ceFEPIme (MAXIPIME) 2 g in sodium chloride 0.9 % 100 mL IVPB (0 g Intravenous Stopped 05/10/21 1906)    ED Course  I have reviewed the triage vital signs and the nursing notes.  Pertinent labs & imaging results that were available during my care of the patient were reviewed by me and considered in my medical decision making (see chart for details).    MDM Rules/Calculators/A&P                         This is a 87 year old male with recent CVA status post left ICA stent placement on 12/8 who presents today with subjective complaints of chills and shortness of breath for the past day.  Differential includes pneumonia versus UTI versus bacteremia versus ACS.  On arrival,  patient with hypotension down to 87/54, which has improved to 108/55.  Febrile at 101.2. On exam, he was tachycardic and in A. fib in the room.  WBC elevated at 15.7.  Lactic acid 1.9.  Creatinine 2.05 up from 1.62 four days ago.  Chest x-ray showed right lower lobe hazy lung opacity concerning for pneumonia.  Overall concern for HCAP with recent discharge from hospital on 12/9.  Patient also now has A. fib which is new compared to EKGs from this recent hospitalization.  We have already initiated sepsis work-up, and we have placed the patient on fluids and on broad-spectrum antibiotics.  Given that the patient has a recent stroke and the risk for hemorrhagic conversion, we have asked neurology further recommendations.  They recommend aspirin and Eliquis only, and we will hold Plavix at this time.  Patient will require admission to the hospital for further work-up of new onset A. fib and for management of HCAP, Curb-65 score of 3.  We have consulted the hospitalist team for admission, currently waiting for a call back.   Final Clinical Impression(s) / ED Diagnoses Final diagnoses:  Atrial fibrillation, unspecified type ([place])  Hospital-acquired pneumonia    Rx / DC Orders ED Discharge Orders     None        Orvis Brill, MD 05/10/21 [place] Reusing    Drenda Freeze, MD 05/14/21 [phone]

## 2021-05-10 NOTE — ED Notes (Signed)
Attending contacted about down-trending blood pressure. Pt shows no changes in mental status or presentation. Additional meds and fluids ordered per Opyd MD.

## 2021-05-10 NOTE — Progress Notes (Signed)
Subjective: CC:CVA PCP: Charles Norlander, DO XFG:HWEXH R Blixt is a 87 y.o. male presenting to clinic today for:  1. CVA Patient was rehospitalized after he suffered from a stroke which resulted in aphasia and dysarthria.  This was apparently initially found on 04/27/2021.  He underwent a left carotid angioplasty and stenting on 12/8 and then was maintained on statin, aspirin and Plavix postprocedure.  Unfortunately his hospital course was complicated by low blood pressures and he was subsequently started on midodrine and Flomax was decreased to 0.4 mg daily.  He was discharged in stable condition and continued on dual antiplatelet therapy with plans for follow-up with IR in 3 months.  He was also discharged home with physical therapy home health.  Recommendations from hospitalist order to advance his Flomax up to 0.8 mg daily if blood pressure is stable.  Recommendations for wean off of midodrine also placed.  He is to see his urologist within the next 2 weeks.  He presents today with his daughter and they note that he has been doing okay since discharge from the hospital.  He feels fine.  He does report some ecchymosis along the right inner groin after procedure.  No significant pain.  He does report a spot on his right forearm where he saw J a few days ago.  He was put on Keflex for this.  They thought that it might be a soft tissue infection but he has not noticed a huge improvement on the Keflex.  He saw his urologist since and was told to stay on the Keflex while they cultured his urine because he has been having slight dysuria.  He has not started with home health yet but they are planning on starting this soon.  Blood pressures have been relatively well controlled at home ranging anywhere from 371I to 967E systolic.  He was started on a different prostate medication yesterday, Rapaflo, which hopefully will have less impact on his blood pressure.  Reports having had some constipation.  MiraLAX  was not helping so he started Dulcolax yesterday which has given him a small bowel movement today.  Reports decreased p.o. intake but admits that he still grieving over his wife, who deceased less than 2 weeks ago  ROS: Per HPI  Allergies  Allergen Reactions   Demerol [Meperidine] Other (See Comments)    Pt. States "he woke up during a colonoscopy"   Spiriva Handihaler [Tiotropium Bromide Monohydrate] Other (See Comments)    Mild Urinary Retention   Past Medical History:  Diagnosis Date   Bronchitis, chronic (HCC)    COPD (chronic obstructive pulmonary disease) (HCC)    Enlarged prostate    GERD (gastroesophageal reflux disease)    Glaucoma    Glucagonoma    Hernia, incisional    at present   [facility] (hard of hearing)    IBS (irritable bowel syndrome)    Sciatic pain    Sinus congestion     Current Outpatient Medications:    acetaminophen (TYLENOL) 325 MG tablet, Take 2 tablets (650 mg total) by mouth every 4 (four) hours as needed for mild pain (or temp > 37.5 C (99.5 F)). (Patient not taking: Reported on 05/08/2021), Disp: , Rfl:    aspirin EC 325 MG EC tablet, Take 1 tablet (325 mg total) by mouth daily., Disp: 30 tablet, Rfl: 3   atorvastatin (LIPITOR) 40 MG tablet, Take 1 tablet (40 mg total) by mouth daily., Disp: 30 tablet, Rfl: 3   azelastine (ASTELIN) 0.1 %  nasal spray, PLACE 1 SPRAY IN EACH NOSTRIL ONCE A DAY AS DIRECTED, Disp: 30 mL, Rfl: 4   BREO ELLIPTA 200-25 MCG/INH AEPB, Inhale 1 puff into the lungs daily., Disp: 60 each, Rfl: 4   cephALEXin (KEFLEX) 500 MG capsule, Take 1 capsule (500 mg total) by mouth 2 (two) times daily., Disp: 14 capsule, Rfl: 0   Cholecalciferol (VITAMIN D3) 2000 UNITS TABS, Take 1 tablet by mouth daily., Disp: , Rfl:    clopidogrel (PLAVIX) 75 MG tablet, Take 1 tablet (75 mg total) by mouth daily., Disp: 30 tablet, Rfl: 2   Cyanocobalamin (VITAMIN B-12) 5000 MCG SUBL, Place 1 tablet under the tongue daily., Disp: , Rfl:    finasteride (PROSCAR)  5 MG tablet, Take 5 mg by mouth daily., Disp: , Rfl:    fluticasone (FLONASE) 50 MCG/ACT nasal spray, Place 2 sprays into both nostrils daily., Disp: , Rfl:    guaiFENesin (MUCINEX) 600 MG 12 hr tablet, Take 600 mg by mouth daily as needed for to loosen phlegm., Disp: , Rfl:    latanoprost (XALATAN) 0.005 % ophthalmic solution, Place 1 drop into the left eye at bedtime. (Patient taking differently: Place 1 drop into both eyes at bedtime.), Disp: 2.5 mL, Rfl: 1   midodrine (PROAMATINE) 5 MG tablet, Take 1 tablet (5 mg total) by mouth 2 (two) times daily with a meal for 7 days., Disp: 14 tablet, Rfl: 0   pantoprazole (PROTONIX) 40 MG tablet, Take 1 tablet (40 mg total) by mouth daily., Disp: 30 tablet, Rfl: 2   tamsulosin (FLOMAX) 0.4 MG CAPS capsule, Take 1 capsule (0.4 mg total) by mouth daily. Take 0.80m daily for 1 week then increase to 0.863mdaily thereafter if BP is stable/normal, Disp: , Rfl:    timolol (TIMOPTIC) 0.5 % ophthalmic solution, Place 1 drop into the left eye every morning., Disp: , Rfl:    VENTOLIN HFA 108 (90 Base) MCG/ACT inhaler, INHALE 2 PUFFS EVERY 6 HOURS AS NEEDED FOR WHEEZING OR SHORTNESS OF BREATH (Patient taking differently: 1 puff every 6 (six) hours as needed for shortness of breath.), Disp: 18 g, Rfl: 0 Social History   Socioeconomic History   Marital status: Married    Spouse name: [name]   Number of children: 3   Years of education: 12+   Highest education level: Bachelor's degree (e.g., BA, AB, BS)  Occupational History   Occupation: Retired    Comment: TeImmunologistTobacco Use   Smoking status: Former    Packs/day: 1.00    Years: 30.00    Pack years: 30.00    Types: Cigarettes    Quit date: 05/28/2006    Years since quitting: 14.9   Smokeless tobacco: Never  Vaping Use   Vaping Use: Never used  Substance and Sexual Activity   Alcohol use: Not Currently    Alcohol/week: 0.0 standard drinks   Drug use: No   Sexual activity: Yes    Birth  control/protection: None  Other Topics Concern   Not on file  Social History Narrative   Patient lives at home with [name].    Patient has 3 children.    Patient has his BS   Patient is retired.    Drinks about 2 cups of coffee per day.      [facility]:   He is still married. 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 He is from [place]  Social Determinants of Health   Financial Resource Strain: Not on file  Food Insecurity: Not on file  Transportation Needs: Not on file  Physical Activity: Not on file  Stress: Not on file  Social Connections: Not on file  Intimate Partner Violence: Not on file   Family History  Problem Relation Age of Onset   Heart disease Mother        Valve replacement and pacemaker   Colon cancer Father    Asthma Other        cousin   Healthy Daughter    Healthy Daughter    Healthy Daughter     Objective: Office vital signs reviewed. BP 120/80    Pulse 79    Temp 98.2 F (36.8 C)    Ht 5' 11"  (1.803 m)    Wt 169 lb 9.6 oz (76.9 kg)    SpO2 98%    BMI 23.65 kg/m   Physical Examination:  General: Awake, alert, nontoxic male, No acute distress HEENT: Sclera white.  Moist mucous membranes Cardio: regular rate and rhythm, S1S2 heard, no murmurs appreciated Pulm: clear to auscultation bilaterally, no wheezes, rhonchi or rales; normal work of breathing on room air MSK: Slow gait and station but ambulating independently Psych: Mood somewhat depressed.  Patient is pleasant, interactive however Neuro: Speech is fluid.  Assessment/ Plan: 87 y.o. male   Hospital discharge follow-up - Plan: AMB Referral to [employer] [facility]  History of CVA in adulthood - Plan: CMP14+EGFR, CBC, AMB Referral to [place]  S/P carotid endarterectomy - Plan: CMP14+EGFR, CBC, AMB Referral to [facility]  Stage 3a chronic kidney disease  ([place]) - Plan: CMP14+EGFR, CBC, AMB Referral to [facility]  Phlebitis  Grief reaction - Plan: AMB Referral to [place]  I reviewed his discharge summary, recommendations.  Labs have been collected as recommended.  I am going to go ahead and refer him to [facility] for counseling services as he is clearly going through grief and has had a very complex medical history as of late.  He seems to be recovering well from his CVA.  I do recommend that he go ahead with home health physical therapy given recent events.  Groin without evidence of infection.  The right forearm appear to be more consistent with a phlebitis.  I agree with continued use of the Keflex however.  No orders of the defined types were placed in this encounter.  No orders of the defined types were placed in this encounter.   Today's visit is for Transitional Care Management.  The patient was discharged from [facility] on 05/06/21 with a primary diagnosis of CVA.   Contact with the patient and/or caregiver, by a clinical staff member, was made on 05/08/21 and was documented as a telephone encounter within the EMR.  Through chart review and discussion with the patient I have determined that management of their condition is of moderate complexity.    Charles Norlander, DO [place] [phone]

## 2021-05-11 ENCOUNTER — Telehealth: Payer: Self-pay | Admitting: *Deleted

## 2021-05-11 ENCOUNTER — Other Ambulatory Visit (HOSPITAL_COMMUNITY): Payer: Self-pay

## 2021-05-11 ENCOUNTER — Telehealth: Payer: BLUE CROSS/BLUE SHIELD

## 2021-05-11 DIAGNOSIS — N170 Acute kidney failure with tubular necrosis: Secondary | ICD-10-CM

## 2021-05-11 DIAGNOSIS — J189 Pneumonia, unspecified organism: Secondary | ICD-10-CM

## 2021-05-11 DIAGNOSIS — Y95 Nosocomial condition: Secondary | ICD-10-CM

## 2021-05-11 LAB — CBC
HCT: 33.9 % — ABNORMAL LOW (ref 39.0–52.0)
Hemoglobin: 10.5 g/dL — ABNORMAL LOW (ref 13.0–17.0)
MCH: 31.5 pg (ref 26.0–34.0)
MCHC: 31 g/dL (ref 30.0–36.0)
MCV: 101.8 fL — ABNORMAL HIGH (ref 80.0–100.0)
Platelets: 250 10*3/uL (ref 150–400)
RBC: 3.33 MIL/uL — ABNORMAL LOW (ref 4.22–5.81)
RDW: 13.4 % (ref 11.5–15.5)
WBC: 17.7 10*3/uL — ABNORMAL HIGH (ref 4.0–10.5)
nRBC: 0 % (ref 0.0–0.2)

## 2021-05-11 LAB — PROCALCITONIN: Procalcitonin: 7.4 ng/mL

## 2021-05-11 LAB — BASIC METABOLIC PANEL
Anion gap: 6 (ref 5–15)
BUN: 20 mg/dL (ref 8–23)
CO2: 19 mmol/L — ABNORMAL LOW (ref 22–32)
Calcium: 7.8 mg/dL — ABNORMAL LOW (ref 8.9–10.3)
Chloride: 111 mmol/L (ref 98–111)
Creatinine, Ser: 1.69 mg/dL — ABNORMAL HIGH (ref 0.61–1.24)
GFR, Estimated: 37 mL/min — ABNORMAL LOW (ref >60)
Glucose, Bld: 110 mg/dL — ABNORMAL HIGH (ref 70–99)
Potassium: 4.5 mmol/L (ref 3.5–5.1)
Sodium: 136 mmol/L (ref 135–145)

## 2021-05-11 MED ORDER — SODIUM CHLORIDE 0.9 % IV SOLN
500.0000 mg | INTRAVENOUS | Status: DC
  Administered 2021-05-11 – 2021-05-13 (×3): 500 mg via INTRAVENOUS
  Filled 2021-05-11 (×3): qty 5

## 2021-05-11 NOTE — Progress Notes (Addendum)
[facility]                                                                              Patient Demographics  Charles Golden, is a 87 y.o. male, DOB - 06-08-1925, HER:[id number]  Admit date - 05/10/2021   Admitting Physician Charles Bulls, MD  Outpatient Primary MD for the patient is Charles Norlander, DO  Outpatient specialists:   LOS - 1  days   Medical records reviewed and are as summarized below:    Chief Complaint  Patient presents with   Shortness of Breath       Brief summary   Patient is a 87 year old male with COPD, CKD 3B, BPH, recent admission for recurrent ischemic CVA status post left carotid angioplasty and stent on 05/04/2021 presented to ED with shaking chills.  Patient's daughter has been staying with him for past 2 weeks after his wife of 35 years died and noted that he seemed to be doing well until developing chills and then vomited on t day of admission.  Patient also reported dysuria and difficulty voiding.  He required in and out cath multiple times during recent hospitalization and had subsequently been voiding on his own.  Also reported back pain that he attributes to recent hospitalization, on does not radiate to the flanks back to his midline low back without lower extremity weakness or numbness.  Reported suprapubic discomfort but no abdominal pain.  He felt short of breath in triage in ED when he was hypotensive and in view of A. fib with RVR. In ED, febrile, O2 sats mid to upper 90s on room air, tachycardia to 120s, BP 87/54.  EKG showed A. fib with RVR.  Chest x-ray showed hazy opacity in the right lower lobe.  Creatinine 2.05, bicarb 18.  WBCs 15.7.  COVID and influenza PCR negative.   Assessment & Plan    Principal Problem: Severe sepsis secondary to UTI [facility]), present on admission, possible pneumonia -Lactic acid normal, patient met sepsis criteria at the time of admission with fevers, tachycardia, hypotension, acute kidney  injury, creatinine 2.0 on admission -Source likely due to UTI and possibly pneumonia -Patient placed on IV fluid hydration, IV antibiotics, follow cultures,  Active Problems: Right lower lung pneumonia, HCAP -Chest x-ray showed right lower lobe hazy lung opacities concerning for pneumonia -Follow blood cultures -Received IV vancomycin x1 in ED, cefepime, will continue IV cefepime, add Zithromax follow cultures  UTI -Patient presented with dysuria, difficulty voiding, UA and culture has been collected, still pending results -Continue IV cefepime, follow urine culture results, blood culture results  Atrial fibrillation with RVR, new -In ED, patient was noted to be in A. fib with rate in 120s, likely respiratory due to #1 -At the time of my examination, back in normal sinus rhythm heart rate in 70s, -Placed on eliquis per neurology recommendations given recent CVA -CHA2DS2-VASc score at least 5 (age, CVA, vascular disease)  COPD, not in acute exacerbation -Continue IV antibiotics, albuterol as needed  Recent CVA (cerebral vascular accident) ([place]), CAS status post ICA stent -Recently admitted with recurrent CVA and underwent left carotid angioplasty, stenting on  12/8 -Continue aspirin, started on eliquis, Plavix stopped for neurology recommendations    Acute renal failure superimposed on stage 3b chronic kidney disease ([place]) -Creatinine 2.0 on admission, up from 1.6  -Likely acute prerenal azotemia in the setting of sepsis, UTI, PNA, also has BPH and required I's and O's cath during recent hospitalization -Continue IV fluid hydration, renally dose meds -Creatinine improving, 1.6 today, close to baseline  BPH -Continue tamsulosin, finasteride  Hypotension -Patient was started on midodrine during recent hospitalization and was going to start tapering with PCP -For now we will continue midodrine    Code Status: Full CODE STATUS DVT Prophylaxis:  apixaban (ELIQUIS) tablet 2.5 mg  Start: 05/10/21 1930 apixaban (ELIQUIS) tablet 2.5 mg   Level of Care: Level of care: Progressive Family Communication: Discussed all imaging results, lab results, explained to the patient    Disposition Plan:     Status is: Inpatient  Remains inpatient appropriate because: Acute kidney injury, new atrial fibrillation with RVR, sepsis due to UTI, pneumonia, on IV antibiotics  Time Spent in minutes   35 minutes Procedures:    Consultants:   None  Antimicrobials:   Anti-infectives (From admission, onward)    Start     Dose/Rate Route Frequency Ordered Stop   05/11/21 1800  ceFEPIme (MAXIPIME) 2 g in sodium chloride 0.9 % 100 mL IVPB        2 g 200 mL/hr over 30 Minutes Intravenous Every 24 hours 05/10/21 2153     05/10/21 1915  vancomycin (VANCOREADY) IVPB 1500 mg/300 mL        1,500 mg 150 mL/hr over 120 Minutes Intravenous  Once 05/10/21 1751 05/10/21 2200   05/10/21 1800  ceFEPIme (MAXIPIME) 2 g in sodium chloride 0.9 % 100 mL IVPB        2 g 200 mL/hr over 30 Minutes Intravenous  Once 05/10/21 1751 05/10/21 1906          Medications  Scheduled Meds:  apixaban  2.5 mg Oral BID   aspirin  325 mg Oral Daily   atorvastatin  40 mg Oral Daily   azelastine  1 spray Each Nare QHS   finasteride  5 mg Oral Daily   fluticasone  2 spray Each Nare QHS   fluticasone furoate-vilanterol  1 puff Inhalation Daily   latanoprost  1 drop Both Eyes QHS   midodrine  5 mg Oral BID WC   pantoprazole  40 mg Oral Daily   sodium chloride flush  3 mL Intravenous Q12H   tamsulosin  0.4 mg Oral QPC breakfast   timolol  1 drop Left Eye q morning   Continuous Infusions:  sodium chloride 100 mL/hr at 05/11/21 0010   ceFEPime (MAXIPIME) IV     PRN Meds:.acetaminophen **OR** acetaminophen, albuterol, guaiFENesin, senna-docusate      Subjective:   Charles Golden was seen and examined today.  Feeling somewhat better since admission, heart rate has improved to now 60's to 70s, normal  sinus rhythm.  Overnight had a temp of 101.2 F, afebrile this a.m. Patient denies dizziness, chest pain, shortness of breath, abdominal pain, N/V/D.   Objective:   Vitals:   05/11/21 0800 05/11/21 0830 05/11/21 0900 05/11/21 0951  BP: (!) 105/58 99/76 (!) 103/51 104/60  Pulse: 61 62 69 66  Resp: 17 (!) _0 Temp:      TempSrc:      SpO2: 99% 92% 98% 99%    Intake/Output Summary (Last 24 hours) at 05/11/2021  [place] filed at 05/10/2021 1736 Gross per 24 hour  Intake 500 ml  Output --  Net 500 ml     Wt Readings from Last 3 Encounters:  05/10/21 76.9 kg  05/08/21 77.1 kg  05/02/21 75.8 kg     Exam General: Alert and oriented x 3, NAD, hearing deficit Cardiovascular: S1 S2 auscultated, no murmurs, RRR Respiratory: Clear to auscultation bilaterally, no wheezing Gastrointestinal: Soft, nontender, nondistended, + bowel sounds Ext: no pedal edema bilaterally Neuro: no new deficits Psych: Normal affect and demeanor, alert and oriented x3    Data Reviewed:  I have personally reviewed following labs and imaging studies  Micro Results Recent Results (from the past 240 hour(s))  MRSA Next Gen by PCR, Nasal     Status: None   Collection Time: 05/02/21 11:47 AM   Specimen: Nasal Mucosa; Nasal Swab  Result Value Ref Range Status   MRSA by PCR Next Gen NOT DETECTED NOT DETECTED Final    Comment: (NOTE) The GeneXpert MRSA Assay (FDA approved for NASAL specimens only), is one component of a comprehensive MRSA colonization surveillance program. It is not intended to diagnose MRSA infection nor to guide or monitor treatment for MRSA infections. Test performance is not FDA approved in patients less than 66 years old. Performed at [facility], [place] [address], [place], [place] [address]   Resp Panel by RT-PCR (Flu A&B, Covid) Nasopharyngeal Swab     Status: None   Collection Time: 05/03/21  9:09 AM   Specimen: Nasopharyngeal Swab; Nasopharyngeal(NP) swabs in  vial transport medium  Result Value Ref Range Status   SARS Coronavirus 2 by RT PCR NEGATIVE NEGATIVE Final    Comment: (NOTE) SARS-CoV-2 target nucleic acids are NOT DETECTED.  The SARS-CoV-2 RNA is generally detectable in upper respiratory specimens during the acute phase of infection. The lowest concentration of SARS-CoV-2 viral copies this assay can detect is 138 copies/mL. A negative result does not preclude SARS-Cov-2 infection and should not be used as the sole basis for treatment or other patient management decisions. A negative result may occur with  improper specimen collection/handling, submission of specimen other than nasopharyngeal swab, presence of viral mutation(s) within the areas targeted by this assay, and inadequate number of viral copies(<138 copies/mL). A negative result must be combined with clinical observations, patient history, and epidemiological information. The expected result is Negative.  Fact Sheet for Patients:  EntrepreneurPulse.com.au  Fact Sheet for Healthcare Providers:  IncredibleEmployment.be  This test is no t yet approved or cleared by the [place] FDA and  has been authorized for detection and/or diagnosis of SARS-CoV-2 by FDA under an Emergency Use Authorization (EUA). This EUA will remain  in effect (meaning this test can be used) for the duration of the COVID-19 declaration under Section 564(b)(1) of the Act, 21 U.S.C.section 360bbb-3(b)(1), unless the authorization is terminated  or revoked sooner.       Influenza A by PCR NEGATIVE NEGATIVE Final   Influenza B by PCR NEGATIVE NEGATIVE Final    Comment: (NOTE) The Xpert Xpress SARS-CoV-2/FLU/RSV plus assay is intended as an aid in the diagnosis of influenza from Nasopharyngeal swab specimens and should not be used as a sole basis for treatment. Nasal washings and aspirates are unacceptable for Xpert Xpress SARS-CoV-2/FLU/RSV testing.  Fact  Sheet for Patients: EntrepreneurPulse.com.au  Fact Sheet for Healthcare Providers: IncredibleEmployment.be  This test is not yet approved or cleared by the [place] FDA and has been authorized for detection  and/or diagnosis of SARS-CoV-2 by FDA under an Emergency Use Authorization (EUA). This EUA will remain in effect (meaning this test can be used) for the duration of the COVID-19 declaration under Section 564(b)(1) of the Act, 21 U.S.C. section 360bbb-3(b)(1), unless the authorization is terminated or revoked.  Performed at [facility], [place] [address] [place], [place] [address]   Resp Panel by RT-PCR (Flu A&B, Covid) Nasopharyngeal Swab     Status: None   Collection Time: 05/10/21  5:02 PM   Specimen: Nasopharyngeal Swab; Nasopharyngeal(NP) swabs in vial transport medium  Result Value Ref Range Status   SARS Coronavirus 2 by RT PCR NEGATIVE NEGATIVE Final    Comment: (NOTE) SARS-CoV-2 target nucleic acids are NOT DETECTED.  The SARS-CoV-2 RNA is generally detectable in upper respiratory specimens during the acute phase of infection. The lowest concentration of SARS-CoV-2 viral copies this assay can detect is 138 copies/mL. A negative result does not preclude SARS-Cov-2 infection and should not be used as the sole basis for treatment or other patient management decisions. A negative result may occur with  improper specimen collection/handling, submission of specimen other than nasopharyngeal swab, presence of viral mutation(s) within the areas targeted by this assay, and inadequate number of viral copies(<138 copies/mL). A negative result must be combined with clinical observations, patient history, and epidemiological information. The expected result is Negative.  Fact Sheet for Patients:  EntrepreneurPulse.com.au  Fact Sheet for Healthcare Providers:  IncredibleEmployment.be  This test is  no t yet approved or cleared by the [place] FDA and  has been authorized for detection and/or diagnosis of SARS-CoV-2 by FDA under an Emergency Use Authorization (EUA). This EUA will remain  in effect (meaning this test can be used) for the duration of the COVID-19 declaration under Section 564(b)(1) of the Act, 21 U.S.C.section 360bbb-3(b)(1), unless the authorization is terminated  or revoked sooner.       Influenza A by PCR NEGATIVE NEGATIVE Final   Influenza B by PCR NEGATIVE NEGATIVE Final    Comment: (NOTE) The Xpert Xpress SARS-CoV-2/FLU/RSV plus assay is intended as an aid in the diagnosis of influenza from Nasopharyngeal swab specimens and should not be used as a sole basis for treatment. Nasal washings and aspirates are unacceptable for Xpert Xpress SARS-CoV-2/FLU/RSV testing.  Fact Sheet for Patients: EntrepreneurPulse.com.au  Fact Sheet for Healthcare Providers: IncredibleEmployment.be  This test is not yet approved or cleared by the [place] FDA and has been authorized for detection and/or diagnosis of SARS-CoV-2 by FDA under an Emergency Use Authorization (EUA). This EUA will remain in effect (meaning this test can be used) for the duration of the COVID-19 declaration under Section 564(b)(1) of the Act, 21 U.S.C. section 360bbb-3(b)(1), unless the authorization is terminated or revoked.  Performed at [facility], [place] [address], [place], [place] [address]   Blood culture (routine x 2)     Status: None (Preliminary result)   Collection Time: 05/10/21  5:40 PM   Specimen: BLOOD LEFT FOREARM  Result Value Ref Range Status   Specimen Description BLOOD LEFT FOREARM  Final   Special Requests   Final    BOTTLES DRAWN AEROBIC AND ANAEROBIC Blood Culture adequate volume   Culture   Final    NO GROWTH < 12 HOURS Performed at [facility], [place] [address], [place], [place] [address]    Report Status PENDING   Incomplete  Blood culture (routine x 2)     Status: None (Preliminary  result)   Collection Time: 05/10/21  5:41 PM   Specimen: BLOOD  Result Value Ref Range Status   Specimen Description BLOOD RIGHT ANTECUBITAL  Final   Special Requests   Final    BOTTLES DRAWN AEROBIC AND ANAEROBIC Blood Culture adequate volume   Culture   Final    NO GROWTH < 12 HOURS Performed at [facility], [address], [place], [place] [address]    Report Status PENDING  Incomplete    Radiology Reports MR BRAIN WO CONTRAST  Result Date: 04/29/2021 CLINICAL DATA:  Acute neurologic deficit EXAM: MRI HEAD WITHOUT CONTRAST TECHNIQUE: Multiplanar, multiecho pulse sequences of the brain and surrounding structures were obtained without intravenous contrast. COMPARISON:  None. FINDINGS: Brain: There are 2 areas of acute infarction within the left frontal lobe, the larger of which is in the insula. No acute hemorrhage. Normal white matter signal. Generalized volume loss without a clear lobar predilection. Focus of chronic microhemorrhage in the right cerebellum the midline structures are normal. Vascular: Magnetic susceptibility effect within the left MCA likely indicates a site of thrombus. Skull and upper cervical spine: Normal calvarium and skull base. Visualized upper cervical spine and soft tissues are normal. Sinuses/Orbits:No paranasal sinus fluid levels or advanced mucosal thickening. No mastoid or middle ear effusion. Normal orbits. IMPRESSION: 1. Two areas of acute infarction within the left frontal lobe, the larger of which is in the insula. No hemorrhage or mass effect. 2. Magnetic susceptibility effect within the left MCA likely indicates a site of thrombus. Electronically Signed   By: Ulyses Jarred M.D.   On: 04/29/2021 19:16   MR BRAIN WO CONTRAST  Result Date: 04/28/2021 CLINICAL DATA:  Acute neurologic deficit EXAM: MRI HEAD WITHOUT CONTRAST TECHNIQUE: Multiplanar, multiecho pulse sequences of the brain  and surrounding structures were obtained without intravenous contrast. COMPARISON:  None. FINDINGS: Brain: Small focus of abnormal diffusion restriction in the left insula. No other diffusion abnormality. Focus of chronic microhemorrhage in the right cerebellum . Normal white matter signal. Generalized volume loss without a clear lobar predilection. The midline structures are normal. Vascular: Occluded left ICA.  The other flow voids are normal. Skull and upper cervical spine: Normal calvarium and skull base. Visualized upper cervical spine and soft tissues are normal. Sinuses/Orbits:No paranasal sinus fluid levels or advanced mucosal thickening. No mastoid or middle ear effusion. Normal orbits. IMPRESSION: 1. Small focus of acute ischemia in the left insula. No hemorrhage or mass effect. 2. Occluded left ICA. Electronically Signed   By: Ulyses Jarred M.D.   On: 04/28/2021 01:50   IR INTRAVSC STENT CERV CAROTID W/EMB-PROT MOD SED  Result Date: 05/05/2021 INDICATION: COLESTON DIROSA is a 87 y.o. male with a past medical history significant for GERD, COPD, CKD III who presented to [facility] ED on 12/1 with complaints of word finding difficulties/unsteady gait. He was found to have a left ICA occlusion and no intervention was recommended as his deficits had resolved. He was discharged on 12/2 with instructions to begin Plavix and ASA, unfortunately he began to experience similar symptoms and returned to the ED on 12/3. He was again admitted and due to the presence of thrombus in the left ICA and resolution of symptoms decision was made to schedule like carotid angioplasty and stenting within a week. Unfortunately he began to experience slurred speech again about 6 hours later and he was brought back to the ED. Code stroke was activated and no acute abnormality was seen on imaging, he  was found to be hypotensive for which he was given a fluid bolus and this improved his slurred speech. He comes to our service today for  diagnostic cerebral angiogram left carotid stenting with cerebral protection device. EXAM: ULTRASOUND-GUIDED VASCULAR ACCESS DIAGNOSTIC CEREBRAL ANGIOGRAM LEFT INTERNAL CAROTID ARTERY ANGIOPLASTY AND STENTING WITH CEREBRAL PROTECTION DEVICE COMPARISON:  CT/CT angiogram of the head and neck April 29, 2021. MEDICATIONS: Refer to anesthesia documentation. ANESTHESIA/SEDATION: The procedure was performed under monitored anesthesia care (MAC). CONTRAST:  100 mL of Omnipaque 300 milligram/mL FLUOROSCOPY TIME:  Fluoroscopy Time: 22 minutes 18 seconds (393 mGy). COMPLICATIONS: None immediate. TECHNIQUE: Informed written consent was obtained from the patient after a thorough discussion of the procedural risks, benefits and alternatives. All questions were addressed. Maximal Sterile Barrier Technique was utilized including caps, mask, sterile gowns, sterile gloves, sterile drape, hand hygiene and skin antiseptic. A timeout was performed prior to the initiation of the procedure. The right groin was prepped and draped in the usual sterile fashion. Using a micropuncture kit and the modified Seldinger technique, access was gained to the right common femoral artery and an 8 French sheath was placed. Real-time ultrasound guidance was utilized for vascular access including the acquisition of a permanent ultrasound image documenting patency of the accessed vessel. Under fluoroscopy, an 8 [place] Walrus balloon guide catheter was navigated over a 6 [place] Berenstein 2 catheter and a 0.035" Terumo Glidewire into the aortic arch. The catheter was placed into the left common carotid artery. Frontal and lateral angiograms of the neck were obtained followed by frontal and lateral angiograms of the head. FINDINGS: 1. Atherosclerotic changes of the right common femoral artery with calcified plaque resulting in mild stenosis. Vessel diameter is adequate for vascular access. 2. Severe stenosis of the left internal carotid artery at the distal  aspect of the carotid bulb with near occlusion (99% stenosis). 3. There is delayed anterograde flow with minimal opacification of the left MCA vascular tree with occlusion of a left M3/MCA posterior division branch, corresponding to occluded branch on prior CT angiogram. 4. No contrast opacification of the right ACA vascular tree. PROCEDURE: Frontal and lateral angiograms of the neck were obtained and utilized as biplane roadmap. Then, and Emboshield NAV 6 cerebral protection device was navigated into the distal cervical segment of the left ICA. Then, left ICA angioplasty was performed with a 4 x 30 mm Viatrac balloon under fluoroscopy while the walrus balloon was inflated. Next, a 6-8 x 40 mm Xact carotid stent was deployed pain in the distal left common carotid artery and carotid bulb while the walrus balloon was inflated. Subsequently, the 4 x 30 mm Viatrac balloon was navigated in the recently deployed stent. In stent angioplasty was performed under fluoroscopy. Then, the cerebral protection device was recaptured. Left ICA angiograms with frontal lateral views of the neck showed brisk anterograde flow with complete resolution of stenosis. Frontal and lateral angiograms of the head showed improved contrast opacification of the left MCA vascular tree with new opacification of the left ACA vascular tree. Delayed left ICA angiograms with frontal and lateral views of the neck showed no evidence of in stent clot formation. The catheter was subsequently withdrawn. A right common femoral artery angiogram was obtained in right anterior oblique view. The sheath was exchanged over the wire for a Perclose pro style. Attempted femoral closure with the was Perclose unsuccessful. The device was then exchanged over the wire for an 8 [place] Angio-Seal which was utilized for access closure. Adequate hemostasis was  achieved. IMPRESSION: Successful and uncomplicated cervical left internal carotid artery angioplasty and stenting  with cerebral protection device. No evidence of thromboembolic complication. PLAN: Continue on dual anti-platelet therapy with aspirin and Plavix. Carotid duplex in 3 months to evaluate for stent patency. Electronically Signed   By: Pedro Earls M.D.   On: 05/05/2021 16:08   IR US Guide Vasc Access Right  Result Date: 05/05/2021 INDICATION: COSTA JHA is a 87 y.o. male with a past medical history significant for GERD, COPD, CKD III who presented to [facility] ED on 12/1 with complaints of word finding difficulties/unsteady gait. He was found to have a left ICA occlusion and no intervention was recommended as his deficits had resolved. He was discharged on 12/2 with instructions to begin Plavix and ASA, unfortunately he began to experience similar symptoms and returned to the ED on 12/3. He was again admitted and due to the presence of thrombus in the left ICA and resolution of symptoms decision was made to schedule like carotid angioplasty and stenting within a week. Unfortunately he began to experience slurred speech again about 6 hours later and he was brought back to the ED. Code stroke was activated and no acute abnormality was seen on imaging, he was found to be hypotensive for which he was given a fluid bolus and this improved his slurred speech. He comes to our service today for diagnostic cerebral angiogram left carotid stenting with cerebral protection device. EXAM: ULTRASOUND-GUIDED VASCULAR ACCESS DIAGNOSTIC CEREBRAL ANGIOGRAM LEFT INTERNAL CAROTID ARTERY ANGIOPLASTY AND STENTING WITH CEREBRAL PROTECTION DEVICE COMPARISON:  CT/CT angiogram of the head and neck April 29, 2021. MEDICATIONS: Refer to anesthesia documentation. ANESTHESIA/SEDATION: The procedure was performed under monitored anesthesia care (MAC). CONTRAST:  100 mL of Omnipaque 300 milligram/mL FLUOROSCOPY TIME:  Fluoroscopy Time: 22 minutes 18 seconds (393 mGy). COMPLICATIONS: None immediate. TECHNIQUE: Informed written  consent was obtained from the patient after a thorough discussion of the procedural risks, benefits and alternatives. All questions were addressed. Maximal Sterile Barrier Technique was utilized including caps, mask, sterile gowns, sterile gloves, sterile drape, hand hygiene and skin antiseptic. A timeout was performed prior to the initiation of the procedure. The right groin was prepped and draped in the usual sterile fashion. Using a micropuncture kit and the modified Seldinger technique, access was gained to the right common femoral artery and an 8 French sheath was placed. Real-time ultrasound guidance was utilized for vascular access including the acquisition of a permanent ultrasound image documenting patency of the accessed vessel. Under fluoroscopy, an 8 [place] Walrus balloon guide catheter was navigated over a 6 [place] Berenstein 2 catheter and a 0.035" Terumo Glidewire into the aortic arch. The catheter was placed into the left common carotid artery. Frontal and lateral angiograms of the neck were obtained followed by frontal and lateral angiograms of the head. FINDINGS: 1. Atherosclerotic changes of the right common femoral artery with calcified plaque resulting in mild stenosis. Vessel diameter is adequate for vascular access. 2. Severe stenosis of the left internal carotid artery at the distal aspect of the carotid bulb with near occlusion (99% stenosis). 3. There is delayed anterograde flow with minimal opacification of the left MCA vascular tree with occlusion of a left M3/MCA posterior division branch, corresponding to occluded branch on prior CT angiogram. 4. No contrast opacification of the right ACA vascular tree. PROCEDURE: Frontal and lateral angiograms of the neck were obtained and utilized as biplane roadmap. Then, and Emboshield NAV 6 cerebral protection device was  navigated into the distal cervical segment of the left ICA. Then, left ICA angioplasty was performed with a 4 x 30 mm Viatrac  balloon under fluoroscopy while the walrus balloon was inflated. Next, a 6-8 x 40 mm Xact carotid stent was deployed pain in the distal left common carotid artery and carotid bulb while the walrus balloon was inflated. Subsequently, the 4 x 30 mm Viatrac balloon was navigated in the recently deployed stent. In stent angioplasty was performed under fluoroscopy. Then, the cerebral protection device was recaptured. Left ICA angiograms with frontal lateral views of the neck showed brisk anterograde flow with complete resolution of stenosis. Frontal and lateral angiograms of the head showed improved contrast opacification of the left MCA vascular tree with new opacification of the left ACA vascular tree. Delayed left ICA angiograms with frontal and lateral views of the neck showed no evidence of in stent clot formation. The catheter was subsequently withdrawn. A right common femoral artery angiogram was obtained in right anterior oblique view. The sheath was exchanged over the wire for a Perclose pro style. Attempted femoral closure with the was Perclose unsuccessful. The device was then exchanged over the wire for an 8 [place] Angio-Seal which was utilized for access closure. Adequate hemostasis was achieved. IMPRESSION: Successful and uncomplicated cervical left internal carotid artery angioplasty and stenting with cerebral protection device. No evidence of thromboembolic complication. PLAN: Continue on dual anti-platelet therapy with aspirin and Plavix. Carotid duplex in 3 months to evaluate for stent patency. Electronically Signed   By: Pedro Earls M.D.   On: 05/05/2021 16:08   DG Chest Port 1 View  Result Date: 05/10/2021 CLINICAL DATA:  Shortness of breath EXAM: PORTABLE CHEST 1 VIEW COMPARISON:  None. FINDINGS: The heart is normal in size. Atherosclerotic calcification of aortic arch. Right lower lobe hazy pulmonary opacities concerning for airspace disease. No pleural effusion or  pneumothorax. IMPRESSION: Right lower lobe hazy lung opacities concerning for pneumonia. Follow-up examination to resolution is recommended. Electronically Signed   By: Keane Police D.O.   On: 05/10/2021 17:48   ECHOCARDIOGRAM COMPLETE  Result Date: 04/28/2021    ECHOCARDIOGRAM REPORT   Patient Name:   JEMELL TOWN Date of Exam: 04/28/2021 Medical Rec #:  [id number]     Height:       70.0 in Accession #:    [id number]    Weight:       170.0 lb Date of Birth:  Oct 20, 1925     BSA:          1.948 m Patient Age:    89 years      BP:           158/79 mmHg Patient Gender: M             HR:           63 bpm. Exam Location:  Inpatient Procedure: 2D Echo, Color Doppler and Cardiac Doppler Indications:    Stroke i63.9  History:        Patient has no prior history of Echocardiogram examinations.                 COPD.  Sonographer:    Raquel Sarna Senior RDCS Referring Phys: [id number] Candace Gallus MELVIN  Sonographer Comments: Technically difficult due to poor echo windows, repositioned at end of study for wall motion. IMPRESSIONS  1. Technically difficult study with limited views. Left ventricular ejection fraction, by estimation, is 60 to 65%. The left ventricle has normal  function. The left ventricle has no regional wall motion abnormalities. Left ventricular diastolic parameters are consistent with Grade II diastolic dysfunction (pseudonormalization). Elevated left atrial pressure.  2. Right ventricle is poorly visualized but appears grossly normal systolic function and mild to moderately dilated  3. Left atrial size was mildly dilated.  4. The mitral valve is normal in structure. No evidence of mitral valve regurgitation.  5. The aortic valve was not well visualized. Aortic valve regurgitation is not visualized. No aortic stenosis is present. FINDINGS  Left Ventricle: Left ventricular ejection fraction, by estimation, is 60 to 65%. The left ventricle has normal function. The left ventricle has no regional wall motion  abnormalities. The left ventricular internal cavity size was normal in size. There is  no left ventricular hypertrophy. Left ventricular diastolic parameters are consistent with Grade II diastolic dysfunction (pseudonormalization). Elevated left atrial pressure. Right Ventricle: The right ventricular size is moderately enlarged. Right vetricular wall thickness was not well visualized. Right ventricular systolic function is normal. Left Atrium: Left atrial size was mildly dilated. Right Atrium: Right atrial size was normal in size. Pericardium: There is no evidence of pericardial effusion. Mitral Valve: The mitral valve is normal in structure. No evidence of mitral valve regurgitation. Tricuspid Valve: The tricuspid valve is normal in structure. Tricuspid valve regurgitation is trivial. Aortic Valve: The aortic valve was not well visualized. Aortic valve regurgitation is not visualized. No aortic stenosis is present. Pulmonic Valve: The pulmonic valve was not well visualized. Pulmonic valve regurgitation is not visualized. Aorta: The aortic root is normal in size and structure. IAS/Shunts: The interatrial septum was not well visualized.  LEFT VENTRICLE PLAX 2D LVIDd:         4.20 cm   Diastology LVIDs:         2.70 cm   LV e' medial:    4.20 cm/s LV PW:         1.00 cm   LV E/e' medial:  13.5 LV IVS:        0.80 cm   LV e' lateral:   3.90 cm/s LVOT diam:     2.00 cm   LV E/e' lateral: 14.5 LV SV:         54 LV SV Index:   28 LVOT Area:     3.14 cm  RIGHT VENTRICLE RV S prime:     11.10 cm/s TAPSE (M-mode): 2.0 cm LEFT ATRIUM             Index        RIGHT ATRIUM           Index LA diam:        2.70 cm 1.39 cm/m   RA Area:     15.50 cm LA Vol (A2C):   69.9 ml 35.88 ml/m  RA Volume:   39.10 ml  20.07 ml/m LA Vol (A4C):   39.5 ml 20.28 ml/m LA Biplane Vol: 70.0 ml 35.93 ml/m  AORTIC VALVE LVOT Vmax:   66.10 cm/s LVOT Vmean:  49.600 cm/s LVOT VTI:    0.171 m  AORTA Ao Root diam: 3.30 cm MITRAL VALVE                TRICUSPID VALVE MV Area (PHT): 2.29 cm    TR Peak grad:   27.7 mmHg MV Decel Time: 331 msec    TR Vmax:        263.00 cm/s MV E velocity: 56.60 cm/s MV A velocity: 70.20 cm/s  SHUNTS  MV E/A ratio:  0.81        Systemic VTI:  0.17 m                            Systemic Diam: 2.00 cm Oswaldo Milian MD Electronically signed by Oswaldo Milian MD Signature Date/Time: 04/28/2021/1:56:04 PM    Final    CT HEAD CODE STROKE WO CONTRAST  Result Date: 05/01/2021 CLINICAL DATA:  Code stroke.  Acute neurologic deficit EXAM: CT HEAD WITHOUT CONTRAST TECHNIQUE: Contiguous axial images were obtained from the base of the skull through the vertex without intravenous contrast. COMPARISON:  Brain MRI 08/06/2020 FINDINGS: Brain: There is no mass, hemorrhage or extra-axial collection. There is generalized atrophy without lobar predilection. There is hypoattenuation of the periventricular white matter, most commonly indicating chronic ischemic microangiopathy. Hypoattenuation of the site of recent left insular infarct. Vascular: No abnormal hyperdensity of the major intracranial arteries or dural venous sinuses. No intracranial atherosclerosis. Skull: The visualized skull base, calvarium and extracranial soft tissues are normal. Sinuses/Orbits: No fluid levels or advanced mucosal thickening of the visualized paranasal sinuses. No mastoid or middle ear effusion. The orbits are normal. ASPECTS [facility] Stroke Program Early CT Score) - Ganglionic level infarction (caudate, lentiform nuclei, internal capsule, insula, M1-M3 cortex): 7 - Supraganglionic infarction (M4-M6 cortex): 3 Total score (0-10 with 10 being normal): 10 IMPRESSION: 1. No acute intracranial abnormality. 2. ASPECTS is 10. 3. A recent left insular infarct, as demonstrated on MRI 04/29/2021. These results were called by telephone at the time of interpretation on 05/01/2021 at 8:41 pm to Dr. Erlinda Hong, Who verbally acknowledged these results. Electronically Signed   By:  Ulyses Jarred M.D.   On: 05/01/2021 20:42   CT HEAD CODE STROKE WO CONTRAST`  Result Date: 04/29/2021 CLINICAL DATA:  Code stroke. 87 year old male with aphasia. Occluded left internal carotid artery and left MCA M2 branches on CTA 2 days ago. EXAM: CT HEAD WITHOUT CONTRAST TECHNIQUE: Contiguous axial images were obtained from the base of the skull through the vertex without intravenous contrast. COMPARISON:  [facility] CT head, CTA head and neck 04/27/2021. Brain MRI yesterday. FINDINGS: Brain: Small area of cytotoxic edema in the left insula corresponding to abnormal diffusion yesterday. Nearby hyperdense left MCA M2 branch in the sylvian fissure persist. But elsewhere gray-white matter differentiation remains stable since 04/27/2021. No acute intracranial hemorrhage identified. No midline shift, mass effect, or evidence of intracranial mass lesion. No ventriculomegaly. Vascular: Calcified atherosclerosis at the skull base. Persistent hyperdense left MCA M2 branch. Skull: No acute osseous abnormality identified. Sinuses/Orbits: Visualized paranasal sinuses and mastoids are stable and well aerated. Other: No acute orbit or scalp soft tissue finding. ASPECTS [facility] Stroke Program Early CT Score) Total score (0-10 with 10 being normal): 9 (abnormal left insula as seen by MRI yesterday). IMPRESSION: 1. Expected CT appearance of the small left insular infarct on MRI yesterday. Elsewhere stable gray-white matter differentiation since the CT on 04/27/2021. ASPECTS 9. 2. No intracranial hemorrhage or mass effect. Study discussed by telephone with Dr. Davonna Belling on 04/29/2021 at 09:19 . Electronically Signed   By: Genevie Ann M.D.   On: 04/29/2021 09:20   CT HEAD CODE STROKE WO CONTRAST  Result Date: 04/27/2021 CLINICAL DATA:  Code stroke.  Acute neuro deficit. EXAM: CT HEAD WITHOUT CONTRAST TECHNIQUE: Contiguous axial images were obtained from the base of the skull through the vertex without  intravenous contrast. COMPARISON:  MRI head 05/14/2013.  CT head 03/11/2009 FINDINGS: Brain: Generalized atrophy. Negative for hydrocephalus. Negative for acute infarct, hemorrhage, mass. Vascular: Hyperdensity of the left middle cerebral artery in the sylvian fissure. Probable left M2 branch occlusion. Correlate with symptoms. Skull: Negative Sinuses/Orbits: Mild mucosal edema right maxillary sinus. Bilateral cataract extraction Other: None ASPECTS ([place] Stroke Program Early CT Score) - Ganglionic level infarction (caudate, lentiform nuclei, internal capsule, insula, M1-M3 cortex): 7 - Supraganglionic infarction (M4-M6 cortex): 3 Total score (0-10 with 10 being normal): 10 IMPRESSION: 1. Negative for acute infarct or hemorrhage 2. Hyperdense left MCA branch in the sylvian fissure likely due to acute thrombosis. Correlate with symptoms. 3. ASPECTS is 10 4. Code stroke imaging results were communicated on 04/27/2021 at 6:39 pm to provider Lindzen via text page amion Electronically Signed   By: Franchot Gallo M.D.   On: 04/27/2021 18:46   CT ANGIO HEAD NECK W WO CM (CODE STROKE)  Result Date: 04/27/2021 CLINICAL DATA:  Acute neuro deficit. Speech abnormality which has cleared. EXAM: CT ANGIOGRAPHY HEAD AND NECK TECHNIQUE: Multidetector CT imaging of the head and neck was performed using the standard protocol during bolus administration of intravenous contrast. Multiplanar CT image reconstructions and MIPs were obtained to evaluate the vascular anatomy. Carotid stenosis measurements (when applicable) are obtained utilizing NASCET criteria, using the distal internal carotid diameter as the denominator. CONTRAST:  34m OMNIPAQUE IOHEXOL 350 MG/ML SOLN COMPARISON:  CT head 04/27/2021 FINDINGS: CTA NECK FINDINGS Aortic arch: Atherosclerotic aortic arch and proximal great vessels. Proximal great vessels are patent. Right carotid system: Atherosclerotic calcification right carotid bifurcation without significant  stenosis Left carotid system: Atherosclerotic disease at the left carotid bifurcation. Occlusion of the proximal left internal carotid artery. Left external carotid artery patent. Vertebral arteries: Both vertebral arteries are patent to the skull base with mild stenosis proximally on the right. Skeleton: Cervical spondylosis.  No acute skeletal abnormality. Other neck: Negative for mass or adenopathy Upper chest: Lung apices clear bilaterally. Review of the MIP images confirms the above findings CTA HEAD FINDINGS Anterior circulation: Right cavernous carotid widely patent. Right anterior and right middle cerebral arteries widely patent. Left internal carotid artery is occluded with reconstitution of the supraclinoid segment. Left anterior cerebral artery patent. Left M1 segment patent. Occlusion of the proximal left M2 branch inferior division which appears acute. Posterior circulation: Both vertebral arteries patent to the basilar. PICA patent bilaterally. Basilar widely patent. Superior cerebellar and posterior cerebral arteries patent bilaterally. Venous sinuses: Normal venous enhancement Anatomic variants: None Review of the MIP images confirms the above findings IMPRESSION: 1. Occlusion of the proximal left internal carotid artery of indeterminate age. However, this is likely acute given the acute occlusion of the inferior division of the left MCA M2 branch. This was hyperdense on CT consistent with acute thrombus. 2. No significant right carotid stenosis. No significant vertebral stenosis. 3. These results were called by telephone at the time of interpretation on 04/27/2021 at 7:24 pm [facility], who verbally acknowledged these results. Electronically Signed   By: CFranchot GalloM.D.   On: 04/27/2021 19:29   CT ANGIO HEAD NECK W WO CM W PERF (CODE STROKE)  Result Date: 04/29/2021 CLINICAL DATA:  Neuro deficit, acute, stroke suspected EXAM: CT ANGIOGRAPHY HEAD AND NECK CT PERFUSION BRAIN  TECHNIQUE: Multidetector CT imaging of the head and neck was performed using the standard protocol during bolus administration of intravenous contrast. Multiplanar CT image reconstructions and MIPs were obtained to evaluate the vascular anatomy. Carotid stenosis  measurements (when applicable) are obtained utilizing NASCET criteria, using the distal internal carotid diameter as the denominator. Multiphase CT imaging of the brain was performed following IV bolus contrast injection. Subsequent parametric perfusion maps were calculated using RAPID software. CONTRAST:  149m OMNIPAQUE IOHEXOL 350 MG/ML SOLN COMPARISON:  Recent CTA and MRI brain FINDINGS: CTA NECK Aortic arch: Calcified plaque along the arch and patent great vessel origins. Right carotid system: Patent. Stable appearance. No hemodynamically significant stenosis at the ICA origin. Left carotid system: Patent common carotid. There has been some recanalization of the internal carotid artery. Persistent mixed plaque including irregular noncalcified plaque along the proximal ICA with superimposed thrombus. There is focal near occlusion with submillimeter residual lumen. Vessel caliber remains small reflecting flow limitation. Vertebral arteries: Patent.  Stable appearance. Skeleton: Stable advanced cervical spine degenerative changes. Other neck: No new abnormality. Upper chest: Emphysema.  No new abnormality. Review of the MIP images confirms the above findings CTA HEAD Anterior circulation: As in the neck, there is no flow present within the left ICA. Left M1 MCA is patent. Persistent left M2 MCA occlusion although now slightly more distal. There is some reconstitution at the M2-M3 junction as before. Right middle and both anterior cerebral arteries are patent. Posterior circulation: Intracranial vertebral arteries are patent. Basilar artery is patent. Major cerebellar artery origins are patent. Posterior cerebral arteries are patent. Venous sinuses: Patent  as allowed by contrast bolus timing. Review of the MIP images confirms the above findings CT Brain Perfusion Findings: CBF (<30%) Volume: 080mPerfusion (Tmax>6.0s) volume: 8314mismatch Volume: 60m41mfarction Location: Left MCA territory IMPRESSION: Interval partial recanalization of the cervical left ICA. Remains focal near occlusion with submillimeter lumen and decreased distal vessel caliber reflecting flow limitation. Intracranially, the left ICA is now patent. There is a persistent left M2 MCA occlusion, but the occlusion now occurs more distally within the sylvian fissure. Some distal reconstitution is present. Perfusion imaging demonstrates no core infarction and 83 mL of penumbra in the left MCA territory. Electronically Signed   By: PranMacy Mis.   On: 04/29/2021 10:01   IR ANGIO INTRA EXTRACRAN SEL INTERNAL CAROTID UNI L MOD SED  Result Date: 05/05/2021 INDICATION: LloyAMOL DOMANSKIa 89 y74. male with a past medical history significant for GERD, COPD, CKD III [facility] with complaints of word finding difficulties/unsteady gait. He was found to have a left ICA occlusion and no intervention was recommended as his deficits had resolved. He was discharged on 12/2 with instructions to begin Plavix and ASA, unfortunately he began to experience similar symptoms and returned to the ED on 12/3. He was again admitted and due to the presence of thrombus in the left ICA and resolution of symptoms decision was made to schedule like carotid angioplasty and stenting within a week. Unfortunately he began to experience slurred speech again about 6 hours later and he was brought back to the ED. Code stroke was activated and no acute abnormality was seen on imaging, he was found to be hypotensive for which he was given a fluid bolus and this improved his slurred speech. He comes to our service today for diagnostic cerebral angiogram left carotid stenting with cerebral protection device. EXAM:  ULTRASOUND-GUIDED VASCULAR ACCESS DIAGNOSTIC CEREBRAL ANGIOGRAM LEFT INTERNAL CAROTID ARTERY ANGIOPLASTY AND STENTING WITH CEREBRAL PROTECTION DEVICE COMPARISON:  CT/CT angiogram of the head and neck April 29, 2021. MEDICATIONS: Refer to anesthesia documentation. ANESTHESIA/SEDATION: The procedure was performed under monitored  anesthesia care (MAC). CONTRAST:  100 mL of Omnipaque 300 milligram/mL FLUOROSCOPY TIME:  Fluoroscopy Time: 22 minutes 18 seconds (393 mGy). COMPLICATIONS: None immediate. TECHNIQUE: Informed written consent was obtained from the patient after a thorough discussion of the procedural risks, benefits and alternatives. All questions were addressed. Maximal Sterile Barrier Technique was utilized including caps, mask, sterile gowns, sterile gloves, sterile drape, hand hygiene and skin antiseptic. A timeout was performed prior to the initiation of the procedure. The right groin was prepped and draped in the usual sterile fashion. Using a micropuncture kit and the modified Seldinger technique, access was gained to the right common femoral artery and an 8 French sheath was placed. Real-time ultrasound guidance was utilized for vascular access including the acquisition of a permanent ultrasound image documenting patency of the accessed vessel. Under fluoroscopy, an 8 [place] Walrus balloon guide catheter was navigated over a 6 [place] Berenstein 2 catheter and a 0.035" Terumo Glidewire into the aortic arch. The catheter was placed into the left common carotid artery. Frontal and lateral angiograms of the neck were obtained followed by frontal and lateral angiograms of the head. FINDINGS: 1. Atherosclerotic changes of the right common femoral artery with calcified plaque resulting in mild stenosis. Vessel diameter is adequate for vascular access. 2. Severe stenosis of the left internal carotid artery at the distal aspect of the carotid bulb with near occlusion (99% stenosis). 3. There is delayed  anterograde flow with minimal opacification of the left MCA vascular tree with occlusion of a left M3/MCA posterior division branch, corresponding to occluded branch on prior CT angiogram. 4. No contrast opacification of the right ACA vascular tree. PROCEDURE: Frontal and lateral angiograms of the neck were obtained and utilized as biplane roadmap. Then, and Emboshield NAV 6 cerebral protection device was navigated into the distal cervical segment of the left ICA. Then, left ICA angioplasty was performed with a 4 x 30 mm Viatrac balloon under fluoroscopy while the walrus balloon was inflated. Next, a 6-8 x 40 mm Xact carotid stent was deployed pain in the distal left common carotid artery and carotid bulb while the walrus balloon was inflated. Subsequently, the 4 x 30 mm Viatrac balloon was navigated in the recently deployed stent. In stent angioplasty was performed under fluoroscopy. Then, the cerebral protection device was recaptured. Left ICA angiograms with frontal lateral views of the neck showed brisk anterograde flow with complete resolution of stenosis. Frontal and lateral angiograms of the head showed improved contrast opacification of the left MCA vascular tree with new opacification of the left ACA vascular tree. Delayed left ICA angiograms with frontal and lateral views of the neck showed no evidence of in stent clot formation. The catheter was subsequently withdrawn. A right common femoral artery angiogram was obtained in right anterior oblique view. The sheath was exchanged over the wire for a Perclose pro style. Attempted femoral closure with the was Perclose unsuccessful. The device was then exchanged over the wire for an 8 [place] Angio-Seal which was utilized for access closure. Adequate hemostasis was achieved. IMPRESSION: Successful and uncomplicated cervical left internal carotid artery angioplasty and stenting with cerebral protection device. No evidence of thromboembolic complication. PLAN:  Continue on dual anti-platelet therapy with aspirin and Plavix. Carotid duplex in 3 months to evaluate for stent patency. Electronically Signed   By: Pedro Earls M.D.   On: 05/05/2021 16:08    Lab Data:  CBC: Recent Labs  Lab 05/10/21 1117 05/10/21 1702 05/10/21 1709 05/11/21 0243  WBC 7.7 15.7*  --  17.7*  NEUTROABS  --  13.9*  --   --   HGB 12.7* 13.2 13.9 10.5*  HCT 38.4 41.1 41.0 33.9*  MCV 92 95.4  --  101.8*  PLT 330 331  --  916   Basic Metabolic Panel: Recent Labs  Lab 05/05/21 0325 05/06/21 0337 05/10/21 1117 05/10/21 1702 05/10/21 1709 05/11/21 0243  NA 135 134* 141 137 136 136  K 4.3 3.9 5.5* 4.4 4.3 4.5  CL 103 104 104 107 107 111  CO2 _0 18*  --  19*  GLUCOSE 103* 120* 97 113* 113* 110*  BUN _1 CREATININE 1.45* 1.62* 1.74* 2.05* 1.90* 1.69*  CALCIUM 8.0* 8.2* 9.4 9.1  --  7.8*   GFR: Estimated Creatinine Clearance: 29.1 mL/min (A) (by C-G formula based on SCr of 1.69 mg/dL (H)). Liver Function Tests: Recent Labs  Lab 05/10/21 1117 05/10/21 1702  AST 20 22  ALT 14 18  ALKPHOS 105 86  BILITOT 0.3 0.8  PROT 6.0 6.4*  ALBUMIN 3.9 3.4*   No results for input(s): LIPASE, AMYLASE in the last 168 hours. No results for input(s): AMMONIA in the last 168 hours. Coagulation Profile: No results for input(s): INR, PROTIME in the last 168 hours. Cardiac Enzymes: No results for input(s): CKTOTAL, CKMB, CKMBINDEX, TROPONINI in the last 168 hours. BNP (last 3 results) No results for input(s): PROBNP in the last 8760 hours. HbA1C: No results for input(s): HGBA1C in the last 72 hours. CBG: No results for input(s): GLUCAP in the last 168 hours. Lipid Profile: No results for input(s): CHOL, HDL, LDLCALC, TRIG, CHOLHDL, LDLDIRECT in the last 72 hours. Thyroid Function Tests: No results for input(s): TSH, T4TOTAL, FREET4, T3FREE, THYROIDAB in the last 72 hours. Anemia Panel: No results for input(s): VITAMINB12, FOLATE,  FERRITIN, TIBC, IRON, RETICCTPCT in the last 72 hours. Urine analysis:    Component Value Date/Time   COLORURINE YELLOW 05/02/2021 0055   APPEARANCEUR CLEAR 05/02/2021 0055   APPEARANCEUR Clear 01/31/2016 1513   LABSPEC <1.005 (L) 05/02/2021 0055   PHURINE 6.0 05/02/2021 0055   GLUCOSEU NEGATIVE 05/02/2021 0055   HGBUR NEGATIVE 05/02/2021 0055   BILIRUBINUR NEGATIVE 05/02/2021 0055   BILIRUBINUR Negative 01/31/2016 1513   KETONESUR NEGATIVE 05/02/2021 0055   PROTEINUR NEGATIVE 05/02/2021 0055   UROBILINOGEN 0.2 09/04/2012 1334   NITRITE NEGATIVE 05/02/2021 0055   LEUKOCYTESUR NEGATIVE 05/02/2021 0055     Rhenda Oregon M.D. [facility] 05/11/2021, 10:26 AM  Available via Epic secure chat 7am-7pm After 7 pm, please refer to night coverage provider listed on amion.

## 2021-05-11 NOTE — Progress Notes (Signed)
Pt arrived to floor around 1230. Pt alert and oriented x4. Pt amb with walker x 1 assist from stretcher to hospital bed. VSS. Respirations even and unlabored on room air. Pt's belongings placed in [address]. Pt oriented to room. Pt instructed on call bell and encouraged to use for assistance. Bed in low position.

## 2021-05-11 NOTE — Telephone Encounter (Signed)
°  Care Management   Follow Up Note   05/11/2021 Name: Charles Golden MRN: [id number] DOB: [birth year]   Referred by: [name], DO Reason for referral : Chronic Care Management (Initial visit cancelled due to hospitalization)   The patient was referred to the case management team for assistance with care management and care coordination but is currently hospitalized since yesterday. ED notes reviewed. Collaborated with [facility] Care Guide regarding rescheduling once discharged.   Follow Up Plan: Forwarding to [facility] Care Guide for outreach and scheduling of Initial Visit once discharged.    Chong Sicilian, BSN, RN-BC Embedded Chronic Care Manager [facility] [facility] Direct Dial: [phone]

## 2021-05-11 NOTE — TOC Benefit Eligibility Note (Signed)
Patient Teacher, English as a foreign language completed.    The patient is currently admitted and upon discharge could be taking Eiquis 5 mg.  The current 30 day co-pay is, $30.00.   The patient is currently admitted and upon discharge could be taking Xarelto 20 mg.  The current 30 day co-pay is, $30.00.   The patient is insured through [place] [employer] [place] Medicare Part D     Lyndel Safe, [place] Patient Advocate Specialist [place] Patient Advocate Team Direct Number: [phone]  Fax: [phone]

## 2021-05-12 LAB — CBC
HCT: 30.1 % — ABNORMAL LOW (ref 39.0–52.0)
Hemoglobin: 9.9 g/dL — ABNORMAL LOW (ref 13.0–17.0)
MCH: 31.2 pg (ref 26.0–34.0)
MCHC: 32.9 g/dL (ref 30.0–36.0)
MCV: 95 fL (ref 80.0–100.0)
Platelets: 244 10*3/uL (ref 150–400)
RBC: 3.17 MIL/uL — ABNORMAL LOW (ref 4.22–5.81)
RDW: 13.4 % (ref 11.5–15.5)
WBC: 9 10*3/uL (ref 4.0–10.5)
nRBC: 0 % (ref 0.0–0.2)

## 2021-05-12 LAB — URINALYSIS, ROUTINE W REFLEX MICROSCOPIC
Bilirubin Urine: NEGATIVE
Glucose, UA: NEGATIVE mg/dL
Hgb urine dipstick: NEGATIVE
Ketones, ur: NEGATIVE mg/dL
Leukocytes,Ua: NEGATIVE
Nitrite: NEGATIVE
Protein, ur: NEGATIVE mg/dL
Specific Gravity, Urine: 1.015 (ref 1.005–1.030)
pH: 5 (ref 5.0–8.0)

## 2021-05-12 LAB — BASIC METABOLIC PANEL
Anion gap: 6 (ref 5–15)
BUN: 17 mg/dL (ref 8–23)
CO2: 20 mmol/L — ABNORMAL LOW (ref 22–32)
Calcium: 8.1 mg/dL — ABNORMAL LOW (ref 8.9–10.3)
Chloride: 111 mmol/L (ref 98–111)
Creatinine, Ser: 1.55 mg/dL — ABNORMAL HIGH (ref 0.61–1.24)
GFR, Estimated: 41 mL/min — ABNORMAL LOW (ref >60)
Glucose, Bld: 98 mg/dL (ref 70–99)
Potassium: 4.2 mmol/L (ref 3.5–5.1)
Sodium: 137 mmol/L (ref 135–145)

## 2021-05-12 LAB — CREATININE, URINE, RANDOM: Creatinine, Urine: 38.74 mg/dL

## 2021-05-12 LAB — PROCALCITONIN: Procalcitonin: 6.25 ng/mL

## 2021-05-12 LAB — SODIUM, URINE, RANDOM: Sodium, Ur: 162 mmol/L

## 2021-05-12 NOTE — Discharge Instructions (Addendum)
Information on my medicine - ELIQUIS (apixaban)  This medication education was reviewed with me or my healthcare representative as part of my discharge preparation.  The pharmacist that spoke with me during my hospital stay was:  Kea Callan BS, PharmD, BCPS  Why was Eliquis prescribed for you? Eliquis was prescribed for you to reduce the risk of a blood clot forming that can cause a stroke if you have a medical condition called atrial fibrillation (a type of irregular heartbeat).  What do You need to know about Eliquis ? Take your Eliquis TWICE DAILY - one tablet in the morning and one tablet in the evening with or without food. If you have difficulty swallowing the tablet whole please discuss with your pharmacist how to take the medication safely.  Take Eliquis exactly as prescribed by your doctor and DO NOT stop taking Eliquis without talking to the doctor who prescribed the medication.  Stopping may increase your risk of developing a stroke.  Refill your prescription before you run out.  After discharge, you should have regular check-up appointments with your healthcare provider that is prescribing your Eliquis.  In the future your dose may need to be changed if your kidney function or weight changes by a significant amount or as you get older.  What do you do if you miss a dose? If you miss a dose, take it as soon as you remember on the same day and resume taking twice daily.  Do not take more than one dose of ELIQUIS at the same time to make up a missed dose.  Important Safety Information A possible side effect of Eliquis is bleeding. You should call your healthcare provider right away if you experience any of the following: Bleeding from an injury or your nose that does not stop. Unusual colored urine (red or dark brown) or unusual colored stools (red or black). Unusual bruising for unknown reasons. A serious fall or if you hit your head (even if there is no bleeding).  Some  medicines may interact with Eliquis and might increase your risk of bleeding or clotting while on Eliquis. To help avoid this, consult your healthcare provider or pharmacist prior to using any new prescription or non-prescription medications, including herbals, vitamins, non-steroidal anti-inflammatory drugs (NSAIDs) and supplements.  This website has more information on Eliquis (apixaban): http://[email or web address].com/eliquis/home

## 2021-05-12 NOTE — Progress Notes (Signed)
[facility]                                                                              Patient Demographics  Charles Golden, is a 87 y.o. male, DOB - [birth year], KDT:[id number]  Admit date - 05/10/2021   Admitting Physician Charles Bulls, MD  Outpatient Primary MD for the patient is Charles Norlander, DO  Outpatient specialists:   LOS - 2  days   Medical records reviewed and are as summarized below:    Chief Complaint  Patient presents with   Shortness of Breath       Brief summary   Patient is a 87 year old male with COPD, CKD 3B, BPH, recent admission for recurrent ischemic CVA status post left carotid angioplasty and stent on 05/04/2021 presented to ED with shaking chills.  Patient's daughter has been staying with him for past 2 weeks after his wife of 91 years died and noted that he seemed to be doing well until developing chills and then vomited on t day of admission.  Patient also reported dysuria and difficulty voiding.  He required in and out cath multiple times during recent hospitalization and had subsequently been voiding on his own.  Also reported back pain that he attributes to recent hospitalization, on does not radiate to the flanks back to his midline low back without lower extremity weakness or numbness.  Reported suprapubic discomfort but no abdominal pain.  He felt short of breath in triage in ED when he was hypotensive and in view of A. fib with RVR. In ED, febrile, O2 sats mid to upper 90s on room air, tachycardia to 120s, BP 87/54.  EKG showed A. fib with RVR.  Chest x-ray showed hazy opacity in the right lower lobe.  Creatinine 2.05, bicarb 18.  WBCs 15.7.  COVID and influenza PCR negative.   Assessment & Plan    Principal Problem: Severe sepsis secondary to UTI [facility]), present on admission, possible pneumonia -Lactic acid normal, patient met sepsis criteria at the time of admission with fevers, tachycardia, hypotension, acute kidney  injury, creatinine 2.0 on admission -Source likely due to UTI and possibly pneumonia -Sepsis physiology resolved, creatinine improving, WBC count improved to 9.0, no fevers  Active Problems: Right lower lung pneumonia, HCAP -Chest x-ray showed right lower lobe hazy lung opacities concerning for pneumonia -Blood cultures negative so far. -Continue Zithromax, cefepime   UTI -Patient presented with dysuria, difficulty voiding, unfortunately UA and culture were collected but not sent hence pending results. -Per patient's urologist, Dr. Jeffie Golden, patient had urine culture on 12/12 which had shown multiple species  <10K colonies, had prophylactically recommended Keflex -Hopefully if continues to improve, will DC home on oral antibiotics   Atrial fibrillation with RVR, new -In ED, patient was noted to be in A. fib with rate in 120s, likely respiratory due to #1 -Placed on eliquis per neurology recommendations given recent CVA -CHA2DS2-VASc score at least 5 (age, CVA, vascular disease) -HR controlled, normal sinus rhythm in 60s to 70s  COPD, not in acute exacerbation -Continue IV antibiotics, albuterol as needed  Recent CVA (cerebral vascular  accident) [facility]), CAS status post ICA stent -Recently admitted with recurrent CVA and underwent left carotid angioplasty, stenting on 12/8 -Continue aspirin, started on eliquis, Plavix stopped for neurology recommendations    Acute renal failure superimposed on stage 3b chronic kidney disease (HCC) -Creatinine 2.0 on admission, up from 1.6  -Likely acute prerenal azotemia in the setting of sepsis, UTI, PNA, also has BPH and required I's and O's cath during recent hospitalization -Creatinine improved to 1.5 today  BPH -Continue tamsulosin, finasteride  Hypotension -Patient was started on midodrine during recent hospitalization and was going to start tapering with PCP -For now we will continue midodrine  Generalized debility -PT OT evaluation today,  recommended home health PT  Code Status: Full CODE STATUS DVT Prophylaxis:  apixaban (ELIQUIS) tablet 2.5 mg Start: 05/10/21 1930 apixaban (ELIQUIS) tablet 2.5 mg   Level of Care: Level of care: Progressive Family Communication: Discussed all imaging results, lab results, explained to the patient and daughter at the bedside   Disposition Plan:     Status is: Inpatient  Remains inpatient appropriate because: Generalized weakness, still not at baseline, awaiting cultures.  Hopefully will DC home in a.m.   Time Spent in minutes 25 minutes Procedures:    Consultants:   None  Antimicrobials:   Anti-infectives (From admission, onward)    Start     Dose/Rate Route Frequency Ordered Stop   05/11/21 1800  ceFEPIme (MAXIPIME) 2 g in sodium chloride 0.9 % 100 mL IVPB        2 g 200 mL/hr over 30 Minutes Intravenous Every 24 hours 05/10/21 2153     05/11/21 1045  azithromycin (ZITHROMAX) 500 mg in sodium chloride 0.9 % 250 mL IVPB        500 mg 250 mL/hr over 60 Minutes Intravenous Every 24 hours 05/11/21 1043     05/10/21 1915  vancomycin (VANCOREADY) IVPB 1500 mg/300 mL        1,500 mg 150 mL/hr over 120 Minutes Intravenous  Once 05/10/21 1751 05/10/21 2200   05/10/21 1800  ceFEPIme (MAXIPIME) 2 g in sodium chloride 0.9 % 100 mL IVPB        2 g 200 mL/hr over 30 Minutes Intravenous  Once 05/10/21 1751 05/10/21 1906          Medications  Scheduled Meds:  apixaban  2.5 mg Oral BID   aspirin  325 mg Oral Daily   atorvastatin  40 mg Oral Daily   azelastine  1 spray Each Nare QHS   finasteride  5 mg Oral Daily   fluticasone  2 spray Each Nare QHS   fluticasone furoate-vilanterol  1 puff Inhalation Daily   latanoprost  1 drop Both Eyes QHS   midodrine  5 mg Oral BID WC   pantoprazole  40 mg Oral Daily   sodium chloride flush  3 mL Intravenous Q12H   tamsulosin  0.4 mg Oral QPC breakfast   timolol  1 drop Left Eye q morning   Continuous Infusions:  sodium chloride 100  mL/hr at 05/12/21 1400   azithromycin Stopped (05/12/21 1006)   ceFEPime (MAXIPIME) IV Stopped (05/11/21 1800)   PRN Meds:.acetaminophen **OR** acetaminophen, albuterol, guaiFENesin, senna-docusate      Subjective:   Charles Golden Granzow was seen and examined today.  Overall improving, no fevers, urination has been better.  No chest pain, shortness of breath, abdominal pain nausea vomiting.   Objective:   Vitals:   05/12/21 0044 05/12/21 0527 05/12/21 0900 05/12/21 1300  BP:  125/69  (!) 144/66 107/62  Pulse: 67 61 77 65  Resp: _0 Temp: 97.6 F (36.4 C)  97.7 F (36.5 C) 98.6 F (37 C)  TempSrc: Oral  Oral Oral  SpO2: 97% 97% 97% 96%  Weight:  82 kg    Height:        Intake/Output Summary (Last 24 hours) at 05/12/2021 1436 Last data filed at 05/12/2021 1400 Gross per 24 hour  Intake 4371.59 ml  Output 1371 ml  Net 3000.59 ml     Wt Readings from Last 3 Encounters:  05/12/21 82 kg  05/10/21 76.9 kg  05/08/21 77.1 kg   Physical Exam General: Alert and oriented x 3, NAD, hearing deficit Cardiovascular: S1 S2 clear, RRR. No pedal edema b/l Respiratory: CTAB, no wheezing, rales or rhonchi Gastrointestinal: Soft, nontender, nondistended, NBS Ext: no pedal edema bilaterally Neuro: no new deficits   Data Reviewed:  I have personally reviewed following labs and imaging studies  Micro Results Recent Results (from the past 240 hour(s))  Resp Panel by RT-PCR (Flu A&B, Covid) Nasopharyngeal Swab     Status: None   Collection Time: 05/03/21  9:09 AM   Specimen: Nasopharyngeal Swab; Nasopharyngeal(NP) swabs in vial transport medium  Result Value Ref Range Status   SARS Coronavirus 2 by RT PCR NEGATIVE NEGATIVE Final    Comment: (NOTE) SARS-CoV-2 target nucleic acids are NOT DETECTED.  The SARS-CoV-2 RNA is generally detectable in upper respiratory specimens during the acute phase of infection. The lowest concentration of SARS-CoV-2 viral copies this assay can  detect is 138 copies/mL. A negative result does not preclude SARS-Cov-2 infection and should not be used as the sole basis for treatment or other patient management decisions. A negative result may occur with  improper specimen collection/handling, submission of specimen other than nasopharyngeal swab, presence of viral mutation(s) within the areas targeted by this assay, and inadequate number of viral copies(<138 copies/mL). A negative result must be combined with clinical observations, patient history, and epidemiological information. The expected result is Negative.  Fact Sheet for Patients:  EntrepreneurPulse.com.au  Fact Sheet for Healthcare Providers:  IncredibleEmployment.be  This test is no t yet approved or cleared by the [place] FDA and  has been authorized for detection and/or diagnosis of SARS-CoV-2 by FDA under an Emergency Use Authorization (EUA). This EUA will remain  in effect (meaning this test can be used) for the duration of the COVID-19 declaration under Section 564(b)(1) of the Act, 21 U.S.C.section 360bbb-3(b)(1), unless the authorization is terminated  or revoked sooner.       Influenza A by PCR NEGATIVE NEGATIVE Final   Influenza B by PCR NEGATIVE NEGATIVE Final    Comment: (NOTE) The Xpert Xpress SARS-CoV-2/FLU/RSV plus assay is intended as an aid in the diagnosis of influenza from Nasopharyngeal swab specimens and should not be used as a sole basis for treatment. Nasal washings and aspirates are unacceptable for Xpert Xpress SARS-CoV-2/FLU/RSV testing.  Fact Sheet for Patients: EntrepreneurPulse.com.au  Fact Sheet for Healthcare Providers: IncredibleEmployment.be  This test is not yet approved or cleared by the [place] FDA and has been authorized for detection and/or diagnosis of SARS-CoV-2 by FDA under an Emergency Use Authorization (EUA). This EUA will remain in  effect (meaning this test can be used) for the duration of the COVID-19 declaration under Section 564(b)(1) of the Act, 21 U.S.C. section 360bbb-3(b)(1), unless the authorization is terminated or revoked.  Performed at [facility], [place]  [address] [place], [place] [address]   Resp Panel by RT-PCR (Flu A&B, Covid) Nasopharyngeal Swab     Status: None   Collection Time: 05/10/21  5:02 PM   Specimen: Nasopharyngeal Swab; Nasopharyngeal(NP) swabs in vial transport medium  Result Value Ref Range Status   SARS Coronavirus 2 by RT PCR NEGATIVE NEGATIVE Final    Comment: (NOTE) SARS-CoV-2 target nucleic acids are NOT DETECTED.  The SARS-CoV-2 RNA is generally detectable in upper respiratory specimens during the acute phase of infection. The lowest concentration of SARS-CoV-2 viral copies this assay can detect is 138 copies/mL. A negative result does not preclude SARS-Cov-2 infection and should not be used as the sole basis for treatment or other patient management decisions. A negative result may occur with  improper specimen collection/handling, submission of specimen other than nasopharyngeal swab, presence of viral mutation(s) within the areas targeted by this assay, and inadequate number of viral copies(<138 copies/mL). A negative result must be combined with clinical observations, patient history, and epidemiological information. The expected result is Negative.  Fact Sheet for Patients:  EntrepreneurPulse.com.au  Fact Sheet for Healthcare Providers:  IncredibleEmployment.be  This test is no t yet approved or cleared by the [place] FDA and  has been authorized for detection and/or diagnosis of SARS-CoV-2 by FDA under an Emergency Use Authorization (EUA). This EUA will remain  in effect (meaning this test can be used) for the duration of the COVID-19 declaration under Section 564(b)(1) of the Act, 21 U.S.C.section 360bbb-3(b)(1),  unless the authorization is terminated  or revoked sooner.       Influenza A by PCR NEGATIVE NEGATIVE Final   Influenza B by PCR NEGATIVE NEGATIVE Final    Comment: (NOTE) The Xpert Xpress SARS-CoV-2/FLU/RSV plus assay is intended as an aid in the diagnosis of influenza from Nasopharyngeal swab specimens and should not be used as a sole basis for treatment. Nasal washings and aspirates are unacceptable for Xpert Xpress SARS-CoV-2/FLU/RSV testing.  Fact Sheet for Patients: EntrepreneurPulse.com.au  Fact Sheet for Healthcare Providers: IncredibleEmployment.be  This test is not yet approved or cleared by the [place] FDA and has been authorized for detection and/or diagnosis of SARS-CoV-2 by FDA under an Emergency Use Authorization (EUA). This EUA will remain in effect (meaning this test can be used) for the duration of the COVID-19 declaration under Section 564(b)(1) of the Act, 21 U.S.C. section 360bbb-3(b)(1), unless the authorization is terminated or revoked.  Performed at [facility], [place] [address], [place], [place] [address]   Blood culture (routine x 2)     Status: None (Preliminary result)   Collection Time: 05/10/21  5:40 PM   Specimen: BLOOD LEFT FOREARM  Result Value Ref Range Status   Specimen Description BLOOD LEFT FOREARM  Final   Special Requests   Final    BOTTLES DRAWN AEROBIC AND ANAEROBIC Blood Culture adequate volume   Culture   Final    NO GROWTH 2 DAYS Performed at [facility], [place] [address] [place], [place] [address]    Report Status PENDING  Incomplete  Blood culture (routine x 2)     Status: None (Preliminary result)   Collection Time: 05/10/21  5:41 PM   Specimen: BLOOD  Result Value Ref Range Status   Specimen Description BLOOD RIGHT ANTECUBITAL  Final   Special Requests   Final    BOTTLES DRAWN AEROBIC AND ANAEROBIC Blood Culture adequate volume   Culture   Final    NO GROWTH 2  DAYS Performed at [facility], [place] [address] [place], [place] [address]    Report Status PENDING  Incomplete    Radiology Reports MR BRAIN WO CONTRAST  Result Date: 04/29/2021 CLINICAL DATA:  Acute neurologic deficit EXAM: MRI HEAD WITHOUT CONTRAST TECHNIQUE: Multiplanar, multiecho pulse sequences of the brain and surrounding structures were obtained without intravenous contrast. COMPARISON:  None. FINDINGS: Brain: There are 2 areas of acute infarction within the left frontal lobe, the larger of which is in the insula. No acute hemorrhage. Normal white matter signal. Generalized volume loss without a clear lobar predilection. Focus of chronic microhemorrhage in the right cerebellum the midline structures are normal. Vascular: Magnetic susceptibility effect within the left MCA likely indicates a site of thrombus. Skull and upper cervical spine: Normal calvarium and skull base. Visualized upper cervical spine and soft tissues are normal. Sinuses/Orbits:No paranasal sinus fluid levels or advanced mucosal thickening. No mastoid or middle ear effusion. Normal orbits. IMPRESSION: 1. Two areas of acute infarction within the left frontal lobe, the larger of which is in the insula. No hemorrhage or mass effect. 2. Magnetic susceptibility effect within the left MCA likely indicates a site of thrombus. Electronically Signed   By: Ulyses Jarred M.D.   On: 04/29/2021 19:16   MR BRAIN WO CONTRAST  Result Date: 04/28/2021 CLINICAL DATA:  Acute neurologic deficit EXAM: MRI HEAD WITHOUT CONTRAST TECHNIQUE: Multiplanar, multiecho pulse sequences of the brain and surrounding structures were obtained without intravenous contrast. COMPARISON:  None. FINDINGS: Brain: Small focus of abnormal diffusion restriction in the left insula. No other diffusion abnormality. Focus of chronic microhemorrhage in the right cerebellum . Normal white matter signal. Generalized volume loss without a clear lobar predilection. The  midline structures are normal. Vascular: Occluded left ICA.  The other flow voids are normal. Skull and upper cervical spine: Normal calvarium and skull base. Visualized upper cervical spine and soft tissues are normal. Sinuses/Orbits:No paranasal sinus fluid levels or advanced mucosal thickening. No mastoid or middle ear effusion. Normal orbits. IMPRESSION: 1. Small focus of acute ischemia in the left insula. No hemorrhage or mass effect. 2. Occluded left ICA. Electronically Signed   By: Ulyses Jarred M.D.   On: 04/28/2021 01:50   IR INTRAVSC STENT CERV CAROTID W/EMB-PROT MOD SED  Result Date: 05/05/2021 INDICATION: CLERANCE UMLAND is a 87 y.o. male with a past medical history significant for GERD, COPD, CKD III who presented to [facility] ED on 12/1 with complaints of word finding difficulties/unsteady gait. He was found to have a left ICA occlusion and no intervention was recommended as his deficits had resolved. He was discharged on 12/2 with instructions to begin Plavix and ASA, unfortunately he began to experience similar symptoms and returned to the ED on 12/3. He was again admitted and due to the presence of thrombus in the left ICA and resolution of symptoms decision was made to schedule like carotid angioplasty and stenting within a week. Unfortunately he began to experience slurred speech again about 6 hours later and he was brought back to the ED. Code stroke was activated and no acute abnormality was seen on imaging, he was found to be hypotensive for which he was given a fluid bolus and this improved his slurred speech. He comes to our service today for diagnostic cerebral angiogram left carotid stenting with cerebral protection device. EXAM: ULTRASOUND-GUIDED VASCULAR ACCESS DIAGNOSTIC CEREBRAL ANGIOGRAM LEFT INTERNAL CAROTID ARTERY ANGIOPLASTY AND STENTING WITH CEREBRAL PROTECTION DEVICE COMPARISON:  CT/CT angiogram of the  head and neck April 29, 2021. MEDICATIONS: Refer to anesthesia documentation.  ANESTHESIA/SEDATION: The procedure was performed under monitored anesthesia care (MAC). CONTRAST:  100 mL of Omnipaque 300 milligram/mL FLUOROSCOPY TIME:  Fluoroscopy Time: 22 minutes 18 seconds (393 mGy). COMPLICATIONS: None immediate. TECHNIQUE: Informed written consent was obtained from the patient after a thorough discussion of the procedural risks, benefits and alternatives. All questions were addressed. Maximal Sterile Barrier Technique was utilized including caps, mask, sterile gowns, sterile gloves, sterile drape, hand hygiene and skin antiseptic. A timeout was performed prior to the initiation of the procedure. The right groin was prepped and draped in the usual sterile fashion. Using a micropuncture kit and the modified Seldinger technique, access was gained to the right common femoral artery and an 8 French sheath was placed. Real-time ultrasound guidance was utilized for vascular access including the acquisition of a permanent ultrasound image documenting patency of the accessed vessel. Under fluoroscopy, an 8 [place] Walrus balloon guide catheter was navigated over a 6 [place] Berenstein 2 catheter and a 0.035" Terumo Glidewire into the aortic arch. The catheter was placed into the left common carotid artery. Frontal and lateral angiograms of the neck were obtained followed by frontal and lateral angiograms of the head. FINDINGS: 1. Atherosclerotic changes of the right common femoral artery with calcified plaque resulting in mild stenosis. Vessel diameter is adequate for vascular access. 2. Severe stenosis of the left internal carotid artery at the distal aspect of the carotid bulb with near occlusion (99% stenosis). 3. There is delayed anterograde flow with minimal opacification of the left MCA vascular tree with occlusion of a left M3/MCA posterior division branch, corresponding to occluded branch on prior CT angiogram. 4. No contrast opacification of the right ACA vascular tree. PROCEDURE: Frontal and  lateral angiograms of the neck were obtained and utilized as biplane roadmap. Then, and Emboshield NAV 6 cerebral protection device was navigated into the distal cervical segment of the left ICA. Then, left ICA angioplasty was performed with a 4 x 30 mm Viatrac balloon under fluoroscopy while the walrus balloon was inflated. Next, a 6-8 x 40 mm Xact carotid stent was deployed pain in the distal left common carotid artery and carotid bulb while the walrus balloon was inflated. Subsequently, the 4 x 30 mm Viatrac balloon was navigated in the recently deployed stent. In stent angioplasty was performed under fluoroscopy. Then, the cerebral protection device was recaptured. Left ICA angiograms with frontal lateral views of the neck showed brisk anterograde flow with complete resolution of stenosis. Frontal and lateral angiograms of the head showed improved contrast opacification of the left MCA vascular tree with new opacification of the left ACA vascular tree. Delayed left ICA angiograms with frontal and lateral views of the neck showed no evidence of in stent clot formation. The catheter was subsequently withdrawn. A right common femoral artery angiogram was obtained in right anterior oblique view. The sheath was exchanged over the wire for a Perclose pro style. Attempted femoral closure with the was Perclose unsuccessful. The device was then exchanged over the wire for an 8 [place] Angio-Seal which was utilized for access closure. Adequate hemostasis was achieved. IMPRESSION: Successful and uncomplicated cervical left internal carotid artery angioplasty and stenting with cerebral protection device. No evidence of thromboembolic complication. PLAN: Continue on dual anti-platelet therapy with aspirin and Plavix. Carotid duplex in 3 months to evaluate for stent patency. Electronically Signed   By: Pedro Earls M.D.   On: 05/05/2021 16:08  IR US Guide Vasc Access Right  Result Date:  05/05/2021 INDICATION: LADISLAUS REPSHER is a 87 y.o. male with a past medical history significant for GERD, COPD, CKD III who presented to [facility] ED on 12/1 with complaints of word finding difficulties/unsteady gait. He was found to have a left ICA occlusion and no intervention was recommended as his deficits had resolved. He was discharged on 12/2 with instructions to begin Plavix and ASA, unfortunately he began to experience similar symptoms and returned to the ED on 12/3. He was again admitted and due to the presence of thrombus in the left ICA and resolution of symptoms decision was made to schedule like carotid angioplasty and stenting within a week. Unfortunately he began to experience slurred speech again about 6 hours later and he was brought back to the ED. Code stroke was activated and no acute abnormality was seen on imaging, he was found to be hypotensive for which he was given a fluid bolus and this improved his slurred speech. He comes to our service today for diagnostic cerebral angiogram left carotid stenting with cerebral protection device. EXAM: ULTRASOUND-GUIDED VASCULAR ACCESS DIAGNOSTIC CEREBRAL ANGIOGRAM LEFT INTERNAL CAROTID ARTERY ANGIOPLASTY AND STENTING WITH CEREBRAL PROTECTION DEVICE COMPARISON:  CT/CT angiogram of the head and neck April 29, 2021. MEDICATIONS: Refer to anesthesia documentation. ANESTHESIA/SEDATION: The procedure was performed under monitored anesthesia care (MAC). CONTRAST:  100 mL of Omnipaque 300 milligram/mL FLUOROSCOPY TIME:  Fluoroscopy Time: 22 minutes 18 seconds (393 mGy). COMPLICATIONS: None immediate. TECHNIQUE: Informed written consent was obtained from the patient after a thorough discussion of the procedural risks, benefits and alternatives. All questions were addressed. Maximal Sterile Barrier Technique was utilized including caps, mask, sterile gowns, sterile gloves, sterile drape, hand hygiene and skin antiseptic. A timeout was performed prior to the initiation  of the procedure. The right groin was prepped and draped in the usual sterile fashion. Using a micropuncture kit and the modified Seldinger technique, access was gained to the right common femoral artery and an 8 French sheath was placed. Real-time ultrasound guidance was utilized for vascular access including the acquisition of a permanent ultrasound image documenting patency of the accessed vessel. Under fluoroscopy, an 8 [place] Walrus balloon guide catheter was navigated over a 6 [place] Berenstein 2 catheter and a 0.035" Terumo Glidewire into the aortic arch. The catheter was placed into the left common carotid artery. Frontal and lateral angiograms of the neck were obtained followed by frontal and lateral angiograms of the head. FINDINGS: 1. Atherosclerotic changes of the right common femoral artery with calcified plaque resulting in mild stenosis. Vessel diameter is adequate for vascular access. 2. Severe stenosis of the left internal carotid artery at the distal aspect of the carotid bulb with near occlusion (99% stenosis). 3. There is delayed anterograde flow with minimal opacification of the left MCA vascular tree with occlusion of a left M3/MCA posterior division branch, corresponding to occluded branch on prior CT angiogram. 4. No contrast opacification of the right ACA vascular tree. PROCEDURE: Frontal and lateral angiograms of the neck were obtained and utilized as biplane roadmap. Then, and Emboshield NAV 6 cerebral protection device was navigated into the distal cervical segment of the left ICA. Then, left ICA angioplasty was performed with a 4 x 30 mm Viatrac balloon under fluoroscopy while the walrus balloon was inflated. Next, a 6-8 x 40 mm Xact carotid stent was deployed pain in the distal left common carotid artery and carotid bulb while the walrus balloon was inflated.  Subsequently, the 4 x 30 mm Viatrac balloon was navigated in the recently deployed stent. In stent angioplasty was performed  under fluoroscopy. Then, the cerebral protection device was recaptured. Left ICA angiograms with frontal lateral views of the neck showed brisk anterograde flow with complete resolution of stenosis. Frontal and lateral angiograms of the head showed improved contrast opacification of the left MCA vascular tree with new opacification of the left ACA vascular tree. Delayed left ICA angiograms with frontal and lateral views of the neck showed no evidence of in stent clot formation. The catheter was subsequently withdrawn. A right common femoral artery angiogram was obtained in right anterior oblique view. The sheath was exchanged over the wire for a Perclose pro style. Attempted femoral closure with the was Perclose unsuccessful. The device was then exchanged over the wire for an 8 [place] Angio-Seal which was utilized for access closure. Adequate hemostasis was achieved. IMPRESSION: Successful and uncomplicated cervical left internal carotid artery angioplasty and stenting with cerebral protection device. No evidence of thromboembolic complication. PLAN: Continue on dual anti-platelet therapy with aspirin and Plavix. Carotid duplex in 3 months to evaluate for stent patency. Electronically Signed   By: Pedro Earls M.D.   On: 05/05/2021 16:08   DG Chest Port 1 View  Result Date: 05/10/2021 CLINICAL DATA:  Shortness of breath EXAM: PORTABLE CHEST 1 VIEW COMPARISON:  None. FINDINGS: The heart is normal in size. Atherosclerotic calcification of aortic arch. Right lower lobe hazy pulmonary opacities concerning for airspace disease. No pleural effusion or pneumothorax. IMPRESSION: Right lower lobe hazy lung opacities concerning for pneumonia. Follow-up examination to resolution is recommended. Electronically Signed   By: Keane Police D.O.   On: 05/10/2021 17:48   ECHOCARDIOGRAM COMPLETE  Result Date: 04/28/2021    ECHOCARDIOGRAM REPORT   Patient Name:   MERRILL VILLARRUEL Date of Exam: 04/28/2021 Medical  Rec #:  [id number]     Height:       70.0 in Accession #:    [id number]    Weight:       170.0 lb Date of Birth:  08/09/1925     BSA:          1.948 m Patient Age:    71 years      BP:           158/79 mmHg Patient Gender: M             HR:           63 bpm. Exam Location:  Inpatient Procedure: 2D Echo, Color Doppler and Cardiac Doppler Indications:    Stroke i63.9  History:        Patient has no prior history of Echocardiogram examinations.                 COPD.  Sonographer:    Raquel Sarna Senior RDCS Referring Phys: [id number] Candace Gallus MELVIN  Sonographer Comments: Technically difficult due to poor echo windows, repositioned at end of study for wall motion. IMPRESSIONS  1. Technically difficult study with limited views. Left ventricular ejection fraction, by estimation, is 60 to 65%. The left ventricle has normal function. The left ventricle has no regional wall motion abnormalities. Left ventricular diastolic parameters are consistent with Grade II diastolic dysfunction (pseudonormalization). Elevated left atrial pressure.  2. Right ventricle is poorly visualized but appears grossly normal systolic function and mild to moderately dilated  3. Left atrial size was mildly dilated.  4. The mitral valve is normal in  structure. No evidence of mitral valve regurgitation.  5. The aortic valve was not well visualized. Aortic valve regurgitation is not visualized. No aortic stenosis is present. FINDINGS  Left Ventricle: Left ventricular ejection fraction, by estimation, is 60 to 65%. The left ventricle has normal function. The left ventricle has no regional wall motion abnormalities. The left ventricular internal cavity size was normal in size. There is  no left ventricular hypertrophy. Left ventricular diastolic parameters are consistent with Grade II diastolic dysfunction (pseudonormalization). Elevated left atrial pressure. Right Ventricle: The right ventricular size is moderately enlarged. Right vetricular wall thickness was  not well visualized. Right ventricular systolic function is normal. Left Atrium: Left atrial size was mildly dilated. Right Atrium: Right atrial size was normal in size. Pericardium: There is no evidence of pericardial effusion. Mitral Valve: The mitral valve is normal in structure. No evidence of mitral valve regurgitation. Tricuspid Valve: The tricuspid valve is normal in structure. Tricuspid valve regurgitation is trivial. Aortic Valve: The aortic valve was not well visualized. Aortic valve regurgitation is not visualized. No aortic stenosis is present. Pulmonic Valve: The pulmonic valve was not well visualized. Pulmonic valve regurgitation is not visualized. Aorta: The aortic root is normal in size and structure. IAS/Shunts: The interatrial septum was not well visualized.  LEFT VENTRICLE PLAX 2D LVIDd:         4.20 cm   Diastology LVIDs:         2.70 cm   LV e' medial:    4.20 cm/s LV PW:         1.00 cm   LV E/e' medial:  13.5 LV IVS:        0.80 cm   LV e' lateral:   3.90 cm/s LVOT diam:     2.00 cm   LV E/e' lateral: 14.5 LV SV:         54 LV SV Index:   28 LVOT Area:     3.14 cm  RIGHT VENTRICLE RV S prime:     11.10 cm/s TAPSE (M-mode): 2.0 cm LEFT ATRIUM             Index        RIGHT ATRIUM           Index LA diam:        2.70 cm 1.39 cm/m   RA Area:     15.50 cm LA Vol (A2C):   69.9 ml 35.88 ml/m  RA Volume:   39.10 ml  20.07 ml/m LA Vol (A4C):   39.5 ml 20.28 ml/m LA Biplane Vol: 70.0 ml 35.93 ml/m  AORTIC VALVE LVOT Vmax:   66.10 cm/s LVOT Vmean:  49.600 cm/s LVOT VTI:    0.171 m  AORTA Ao Root diam: 3.30 cm MITRAL VALVE               TRICUSPID VALVE MV Area (PHT): 2.29 cm    TR Peak grad:   27.7 mmHg MV Decel Time: 331 msec    TR Vmax:        263.00 cm/s MV E velocity: 56.60 cm/s MV A velocity: 70.20 cm/s  SHUNTS MV E/A ratio:  0.81        Systemic VTI:  0.17 m                            Systemic Diam: 2.00 cm Oswaldo Milian MD Electronically signed by Oswaldo Milian MD Signature  Date/Time: 04/28/2021/1:56:04  PM    Final    CT HEAD CODE STROKE WO CONTRAST  Result Date: 05/01/2021 CLINICAL DATA:  Code stroke.  Acute neurologic deficit EXAM: CT HEAD WITHOUT CONTRAST TECHNIQUE: Contiguous axial images were obtained from the base of the skull through the vertex without intravenous contrast. COMPARISON:  Brain MRI 08/06/2020 FINDINGS: Brain: There is no mass, hemorrhage or extra-axial collection. There is generalized atrophy without lobar predilection. There is hypoattenuation of the periventricular white matter, most commonly indicating chronic ischemic microangiopathy. Hypoattenuation of the site of recent left insular infarct. Vascular: No abnormal hyperdensity of the major intracranial arteries or dural venous sinuses. No intracranial atherosclerosis. Skull: The visualized skull base, calvarium and extracranial soft tissues are normal. Sinuses/Orbits: No fluid levels or advanced mucosal thickening of the visualized paranasal sinuses. No mastoid or middle ear effusion. The orbits are normal. ASPECTS [facility] Stroke Program Early CT Score) - Ganglionic level infarction (caudate, lentiform nuclei, internal capsule, insula, M1-M3 cortex): 7 - Supraganglionic infarction (M4-M6 cortex): 3 Total score (0-10 with 10 being normal): 10 IMPRESSION: 1. No acute intracranial abnormality. 2. ASPECTS is 10. 3. A recent left insular infarct, as demonstrated on MRI 04/29/2021. These results were called by telephone at the time of interpretation on 05/01/2021 at 8:41 pm to Dr. Erlinda Hong, Who verbally acknowledged these results. Electronically Signed   By: Ulyses Jarred M.D.   On: 05/01/2021 20:42   CT HEAD CODE STROKE WO CONTRAST`  Result Date: 04/29/2021 CLINICAL DATA:  Code stroke. 87 year old male with aphasia. Occluded left internal carotid artery and left MCA M2 branches on CTA 2 days ago. EXAM: CT HEAD WITHOUT CONTRAST TECHNIQUE: Contiguous axial images were obtained from the base of the skull through the  vertex without intravenous contrast. COMPARISON:  [facility] CT head, CTA head and neck 04/27/2021. Brain MRI yesterday. FINDINGS: Brain: Small area of cytotoxic edema in the left insula corresponding to abnormal diffusion yesterday. Nearby hyperdense left MCA M2 branch in the sylvian fissure persist. But elsewhere gray-white matter differentiation remains stable since 04/27/2021. No acute intracranial hemorrhage identified. No midline shift, mass effect, or evidence of intracranial mass lesion. No ventriculomegaly. Vascular: Calcified atherosclerosis at the skull base. Persistent hyperdense left MCA M2 branch. Skull: No acute osseous abnormality identified. Sinuses/Orbits: Visualized paranasal sinuses and mastoids are stable and well aerated. Other: No acute orbit or scalp soft tissue finding. ASPECTS [facility] Stroke Program Early CT Score) Total score (0-10 with 10 being normal): 9 (abnormal left insula as seen by MRI yesterday). IMPRESSION: 1. Expected CT appearance of the small left insular infarct on MRI yesterday. Elsewhere stable gray-white matter differentiation since the CT on 04/27/2021. ASPECTS 9. 2. No intracranial hemorrhage or mass effect. Study discussed by telephone with Dr. Davonna Belling on 04/29/2021 at 09:19 . Electronically Signed   By: Genevie Ann M.D.   On: 04/29/2021 09:20   CT HEAD CODE STROKE WO CONTRAST  Result Date: 04/27/2021 CLINICAL DATA:  Code stroke.  Acute neuro deficit. EXAM: CT HEAD WITHOUT CONTRAST TECHNIQUE: Contiguous axial images were obtained from the base of the skull through the vertex without intravenous contrast. COMPARISON:  MRI head 05/14/2013.  CT head 03/11/2009 FINDINGS: Brain: Generalized atrophy. Negative for hydrocephalus. Negative for acute infarct, hemorrhage, mass. Vascular: Hyperdensity of the left middle cerebral artery in the sylvian fissure. Probable left M2 branch occlusion. Correlate with symptoms. Skull: Negative Sinuses/Orbits: Mild mucosal  edema right maxillary sinus. Bilateral cataract extraction Other: None ASPECTS ([place] Stroke Program Early CT Score) - Ganglionic  level infarction (caudate, lentiform nuclei, internal capsule, insula, M1-M3 cortex): 7 - Supraganglionic infarction (M4-M6 cortex): 3 Total score (0-10 with 10 being normal): 10 IMPRESSION: 1. Negative for acute infarct or hemorrhage 2. Hyperdense left MCA branch in the sylvian fissure likely due to acute thrombosis. Correlate with symptoms. 3. ASPECTS is 10 4. Code stroke imaging results were communicated on 04/27/2021 at 6:39 pm to provider Lindzen via text page amion Electronically Signed   By: Franchot Gallo M.D.   On: 04/27/2021 18:46   CT ANGIO HEAD NECK W WO CM (CODE STROKE)  Result Date: 04/27/2021 CLINICAL DATA:  Acute neuro deficit. Speech abnormality which has cleared. EXAM: CT ANGIOGRAPHY HEAD AND NECK TECHNIQUE: Multidetector CT imaging of the head and neck was performed using the standard protocol during bolus administration of intravenous contrast. Multiplanar CT image reconstructions and MIPs were obtained to evaluate the vascular anatomy. Carotid stenosis measurements (when applicable) are obtained utilizing NASCET criteria, using the distal internal carotid diameter as the denominator. CONTRAST:  65m OMNIPAQUE IOHEXOL 350 MG/ML SOLN COMPARISON:  CT head 04/27/2021 FINDINGS: CTA NECK FINDINGS Aortic arch: Atherosclerotic aortic arch and proximal great vessels. Proximal great vessels are patent. Right carotid system: Atherosclerotic calcification right carotid bifurcation without significant stenosis Left carotid system: Atherosclerotic disease at the left carotid bifurcation. Occlusion of the proximal left internal carotid artery. Left external carotid artery patent. Vertebral arteries: Both vertebral arteries are patent to the skull base with mild stenosis proximally on the right. Skeleton: Cervical spondylosis.  No acute skeletal abnormality. Other neck: Negative  for mass or adenopathy Upper chest: Lung apices clear bilaterally. Review of the MIP images confirms the above findings CTA HEAD FINDINGS Anterior circulation: Right cavernous carotid widely patent. Right anterior and right middle cerebral arteries widely patent. Left internal carotid artery is occluded with reconstitution of the supraclinoid segment. Left anterior cerebral artery patent. Left M1 segment patent. Occlusion of the proximal left M2 branch inferior division which appears acute. Posterior circulation: Both vertebral arteries patent to the basilar. PICA patent bilaterally. Basilar widely patent. Superior cerebellar and posterior cerebral arteries patent bilaterally. Venous sinuses: Normal venous enhancement Anatomic variants: None Review of the MIP images confirms the above findings IMPRESSION: 1. Occlusion of the proximal left internal carotid artery of indeterminate age. However, this is likely acute given the acute occlusion of the inferior division of the left MCA M2 branch. This was hyperdense on CT consistent with acute thrombus. 2. No significant right carotid stenosis. No significant vertebral stenosis. 3. These results were called by telephone at the time of interpretation on 04/27/2021 at 7:24 pm to [facility], who verbally acknowledged these results. Electronically Signed   By: CFranchot GalloM.D.   On: 04/27/2021 19:29   CT ANGIO HEAD NECK W WO CM W PERF (CODE STROKE)  Result Date: 04/29/2021 CLINICAL DATA:  Neuro deficit, acute, stroke suspected EXAM: CT ANGIOGRAPHY HEAD AND NECK CT PERFUSION BRAIN TECHNIQUE: Multidetector CT imaging of the head and neck was performed using the standard protocol during bolus administration of intravenous contrast. Multiplanar CT image reconstructions and MIPs were obtained to evaluate the vascular anatomy. Carotid stenosis measurements (when applicable) are obtained utilizing NASCET criteria, using the distal internal carotid diameter as the  denominator. Multiphase CT imaging of the brain was performed following IV bolus contrast injection. Subsequent parametric perfusion maps were calculated using RAPID software. CONTRAST:  108mOMNIPAQUE IOHEXOL 350 MG/ML SOLN COMPARISON:  Recent CTA and MRI brain FINDINGS: CTA NECK Aortic arch:  Calcified plaque along the arch and patent great vessel origins. Right carotid system: Patent. Stable appearance. No hemodynamically significant stenosis at the ICA origin. Left carotid system: Patent common carotid. There has been some recanalization of the internal carotid artery. Persistent mixed plaque including irregular noncalcified plaque along the proximal ICA with superimposed thrombus. There is focal near occlusion with submillimeter residual lumen. Vessel caliber remains small reflecting flow limitation. Vertebral arteries: Patent.  Stable appearance. Skeleton: Stable advanced cervical spine degenerative changes. Other neck: No new abnormality. Upper chest: Emphysema.  No new abnormality. Review of the MIP images confirms the above findings CTA HEAD Anterior circulation: As in the neck, there is no flow present within the left ICA. Left M1 MCA is patent. Persistent left M2 MCA occlusion although now slightly more distal. There is some reconstitution at the M2-M3 junction as before. Right middle and both anterior cerebral arteries are patent. Posterior circulation: Intracranial vertebral arteries are patent. Basilar artery is patent. Major cerebellar artery origins are patent. Posterior cerebral arteries are patent. Venous sinuses: Patent as allowed by contrast bolus timing. Review of the MIP images confirms the above findings CT Brain Perfusion Findings: CBF (<30%) Volume: 101m Perfusion (Tmax>6.0s) volume: 834mMismatch Volume: 833mnfarction Location: Left MCA territory IMPRESSION: Interval partial recanalization of the cervical left ICA. Remains focal near occlusion with submillimeter lumen and decreased distal  vessel caliber reflecting flow limitation. Intracranially, the left ICA is now patent. There is a persistent left M2 MCA occlusion, but the occlusion now occurs more distally within the sylvian fissure. Some distal reconstitution is present. Perfusion imaging demonstrates no core infarction and 83 mL of penumbra in the left MCA territory. Electronically Signed   By: PraMacy MisD.   On: 04/29/2021 10:01   IR ANGIO INTRA EXTRACRAN SEL INTERNAL CAROTID UNI L MOD SED  Result Date: 05/05/2021 INDICATION: LloWARRICK LLERA a [age]o. male with a past medical history significant for GERD, COPD, CKD III who [facility] with complaints of word finding difficulties/unsteady gait. He was found to have a left ICA occlusion and no intervention was recommended as his deficits had resolved. He was discharged on 12/2 with instructions to begin Plavix and ASA, unfortunately he began to experience similar symptoms and returned to the ED on 12/3. He was again admitted and due to the presence of thrombus in the left ICA and resolution of symptoms decision was made to schedule like carotid angioplasty and stenting within a week. Unfortunately he began to experience slurred speech again about 6 hours later and he was brought back to the ED. Code stroke was activated and no acute abnormality was seen on imaging, he was found to be hypotensive for which he was given a fluid bolus and this improved his slurred speech. He comes to our service today for diagnostic cerebral angiogram left carotid stenting with cerebral protection device. EXAM: ULTRASOUND-GUIDED VASCULAR ACCESS DIAGNOSTIC CEREBRAL ANGIOGRAM LEFT INTERNAL CAROTID ARTERY ANGIOPLASTY AND STENTING WITH CEREBRAL PROTECTION DEVICE COMPARISON:  CT/CT angiogram of the head and neck April 29, 2021. MEDICATIONS: Refer to anesthesia documentation. ANESTHESIA/SEDATION: The procedure was performed under monitored anesthesia care (MAC). CONTRAST:  100 mL of  Omnipaque 300 milligram/mL FLUOROSCOPY TIME:  Fluoroscopy Time: 22 minutes 18 seconds (393 mGy). COMPLICATIONS: None immediate. TECHNIQUE: Informed written consent was obtained from the patient after a thorough discussion of the procedural risks, benefits and alternatives. All questions were addressed. Maximal Sterile Barrier Technique was utilized including caps, mask, sterile  gowns, sterile gloves, sterile drape, hand hygiene and skin antiseptic. A timeout was performed prior to the initiation of the procedure. The right groin was prepped and draped in the usual sterile fashion. Using a micropuncture kit and the modified Seldinger technique, access was gained to the right common femoral artery and an 8 French sheath was placed. Real-time ultrasound guidance was utilized for vascular access including the acquisition of a permanent ultrasound image documenting patency of the accessed vessel. Under fluoroscopy, an 8 [place] Walrus balloon guide catheter was navigated over a 6 [place] Berenstein 2 catheter and a 0.035" Terumo Glidewire into the aortic arch. The catheter was placed into the left common carotid artery. Frontal and lateral angiograms of the neck were obtained followed by frontal and lateral angiograms of the head. FINDINGS: 1. Atherosclerotic changes of the right common femoral artery with calcified plaque resulting in mild stenosis. Vessel diameter is adequate for vascular access. 2. Severe stenosis of the left internal carotid artery at the distal aspect of the carotid bulb with near occlusion (99% stenosis). 3. There is delayed anterograde flow with minimal opacification of the left MCA vascular tree with occlusion of a left M3/MCA posterior division branch, corresponding to occluded branch on prior CT angiogram. 4. No contrast opacification of the right ACA vascular tree. PROCEDURE: Frontal and lateral angiograms of the neck were obtained and utilized as biplane roadmap. Then, and Emboshield NAV 6  cerebral protection device was navigated into the distal cervical segment of the left ICA. Then, left ICA angioplasty was performed with a 4 x 30 mm Viatrac balloon under fluoroscopy while the walrus balloon was inflated. Next, a 6-8 x 40 mm Xact carotid stent was deployed pain in the distal left common carotid artery and carotid bulb while the walrus balloon was inflated. Subsequently, the 4 x 30 mm Viatrac balloon was navigated in the recently deployed stent. In stent angioplasty was performed under fluoroscopy. Then, the cerebral protection device was recaptured. Left ICA angiograms with frontal lateral views of the neck showed brisk anterograde flow with complete resolution of stenosis. Frontal and lateral angiograms of the head showed improved contrast opacification of the left MCA vascular tree with new opacification of the left ACA vascular tree. Delayed left ICA angiograms with frontal and lateral views of the neck showed no evidence of in stent clot formation. The catheter was subsequently withdrawn. A right common femoral artery angiogram was obtained in right anterior oblique view. The sheath was exchanged over the wire for a Perclose pro style. Attempted femoral closure with the was Perclose unsuccessful. The device was then exchanged over the wire for an 8 [place] Angio-Seal which was utilized for access closure. Adequate hemostasis was achieved. IMPRESSION: Successful and uncomplicated cervical left internal carotid artery angioplasty and stenting with cerebral protection device. No evidence of thromboembolic complication. PLAN: Continue on dual anti-platelet therapy with aspirin and Plavix. Carotid duplex in 3 months to evaluate for stent patency. Electronically Signed   By: Pedro Earls M.D.   On: 05/05/2021 16:08    Lab Data:  CBC: Recent Labs  Lab 05/10/21 1117 05/10/21 1702 05/10/21 1709 05/11/21 0243 05/12/21 0143  WBC 7.7 15.7*  --  17.7* 9.0  NEUTROABS  --  13.9*   --   --   --   HGB 12.7* 13.2 13.9 10.5* 9.9*  HCT 38.4 41.1 41.0 33.9* 30.1*  MCV 92 95.4  --  101.8* 95.0  PLT 330 331  --  250 244  Basic Metabolic Panel: Recent Labs  Lab 05/06/21 0337 05/10/21 1117 05/10/21 1702 05/10/21 1709 05/11/21 0243 05/12/21 0143  NA 134* 141 137 136 136 137  K 3.9 5.5* 4.4 4.3 4.5 4.2  CL 104 104 107 107 111 111  CO2 22 24 18*  --  19* 20*  GLUCOSE 120* 97 113* 113* 110* 98  BUN _0 CREATININE 1.62* 1.74* 2.05* 1.90* 1.69* 1.55*  CALCIUM 8.2* 9.4 9.1  --  7.8* 8.1*   GFR: Estimated Creatinine Clearance: 30.7 mL/min (A) (by C-G formula based on SCr of 1.55 mg/dL (H)). Liver Function Tests: Recent Labs  Lab 05/10/21 1117 05/10/21 1702  AST 20 22  ALT 14 18  ALKPHOS 105 86  BILITOT 0.3 0.8  PROT 6.0 6.4*  ALBUMIN 3.9 3.4*   No results for input(s): LIPASE, AMYLASE in the last 168 hours. No results for input(s): AMMONIA in the last 168 hours. Coagulation Profile: No results for input(s): INR, PROTIME in the last 168 hours. Cardiac Enzymes: No results for input(s): CKTOTAL, CKMB, CKMBINDEX, TROPONINI in the last 168 hours. BNP (last 3 results) No results for input(s): PROBNP in the last 8760 hours. HbA1C: No results for input(s): HGBA1C in the last 72 hours. CBG: No results for input(s): GLUCAP in the last 168 hours. Lipid Profile: No results for input(s): CHOL, HDL, LDLCALC, TRIG, CHOLHDL, LDLDIRECT in the last 72 hours. Thyroid Function Tests: No results for input(s): TSH, T4TOTAL, FREET4, T3FREE, THYROIDAB in the last 72 hours. Anemia Panel: No results for input(s): VITAMINB12, FOLATE, FERRITIN, TIBC, IRON, RETICCTPCT in the last 72 hours. Urine analysis:    Component Value Date/Time   COLORURINE YELLOW 05/12/2021 1000   APPEARANCEUR CLEAR 05/12/2021 1000   APPEARANCEUR Clear 01/31/2016 1513   LABSPEC 1.015 05/12/2021 1000   PHURINE 5.0 05/12/2021 1000   GLUCOSEU NEGATIVE 05/12/2021 1000   HGBUR NEGATIVE  05/12/2021 1000   BILIRUBINUR NEGATIVE 05/12/2021 1000   BILIRUBINUR Negative 01/31/2016 1513   KETONESUR NEGATIVE 05/12/2021 1000   PROTEINUR NEGATIVE 05/12/2021 1000   UROBILINOGEN 0.2 09/04/2012 1334   NITRITE NEGATIVE 05/12/2021 1000   LEUKOCYTESUR NEGATIVE 05/12/2021 1000       M.D. [facility] 05/12/2021, 2:36 PM  Available via Epic secure chat 7am-7pm After 7 pm, please refer to night coverage provider listed on amion.

## 2021-05-12 NOTE — Care Management Important Message (Signed)
Important Message  Patient Details  Name: Charles Golden MRN: [id number] Date of Birth: May 18, 1926   Medicare Important Message Given:  Yes     Hannah Beat 05/12/2021, 2:35 PM

## 2021-05-12 NOTE — Progress Notes (Signed)
°  Transition of Care [facility]) Screening Note   Patient Details  Name: Charles Golden Date of Birth: December 03, 1925   Transition of Care [facility]) CM/SW Contact:    Benard Halsted, LCSW Phone Number: 05/12/2021, 4:26 PM    Transition of Care Department [facility]) has reviewed patient and no TOC needs have been identified at this time. Patient was set up with [facility] services upon last hospital discharge. We will continue to monitor patient advancement through interdisciplinary progression rounds. If new patient transition needs arise, please place a TOC consult.

## 2021-05-12 NOTE — Evaluation (Signed)
Physical Therapy Evaluation Patient Details Name: Charles Golden MRN: [id number] DOB: 1926/02/20 Today's Date: 05/12/2021  History of Present Illness  87 y.o. male with medical history significant for COPD, CKD 3B, BPH, and recent admission with recurrent ischemic CVA status post left carotid angioplasty and stent on 05/04/2021, presenting to the emergency department 12/14 with shaking chills. Admitted for sepsis secondary to UTI.   Clinical Impression  Pt admitted with above diagnosis. Pt with multiple recent hospitalizations. Prior to hospitalizations, pt independent with mobility and ADLs. Driving. His wife of 28 years passed away 2.5 weeks ago. On eval, pt required supervision bed mobility, min guard assist transfers, and min guard assist ambulation 150' with RW.  Pt currently with functional limitations due to the deficits listed below (see PT Problem List). Pt will benefit from skilled PT to increase their independence and safety with mobility to allow discharge to the venue listed below.  Pt's daughters plan to stay with pt upon d/c.        Recommendations for follow up therapy are one component of a multi-disciplinary discharge planning process, led by the attending physician.  Recommendations may be updated based on patient status, additional functional criteria and insurance authorization.  Follow Up Recommendations Home health PT    Assistance Recommended at Discharge Frequent or constant Supervision/Assistance  Functional Status Assessment Patient has had a recent decline in their functional status and demonstrates the ability to make significant improvements in function in a reasonable and predictable amount of time.  Equipment Recommendations  None recommended by PT    Recommendations for Other Services       Precautions / Restrictions Precautions Precautions: Fall      Mobility  Bed Mobility Overal bed mobility: Needs Assistance Bed Mobility: Supine to Sit     Supine  to sit: Supervision;HOB elevated     General bed mobility comments: +rail, increased time    Transfers Overall transfer level: Needs assistance Equipment used: Rolling walker (2 wheels) Transfers: Sit to/from Stand Sit to Stand: Min guard           General transfer comment: increased time to power up    Ambulation/Gait Ambulation/Gait assistance: Min guard Gait Distance (Feet): 150 Feet Assistive device: Rolling walker (2 wheels) Gait Pattern/deviations: Step-through pattern;Trunk flexed Gait velocity: WFL Gait velocity interpretation: 1.31 - 2.62 ft/sec, indicative of limited community ambulator   General Gait Details: steady gait with RW  Stairs            Wheelchair Mobility    Modified Rankin (Stroke Patients Only)       Balance Overall balance assessment: Needs assistance Sitting-balance support: No upper extremity supported;Feet supported Sitting balance-Leahy Scale: Normal     Standing balance support: No upper extremity supported;Bilateral upper extremity supported;During functional activity Standing balance-Leahy Scale: Fair Standing balance comment: static stand without UE support, RW for amb                             Pertinent Vitals/Pain Pain Assessment: No/denies pain    Home Living Family/patient expects to be discharged to:: Private residence Living Arrangements: Alone Available Help at Discharge: Family;Available 24 hours/day (daughters) Type of Home: House Home Access: Stairs to enter Entrance Stairs-Rails: Right Entrance Stairs-Number of Steps: 2   Home Layout: One level Home Equipment: Conservation officer, nature (2 wheels);Shower seat;Grab bars - tub/shower;Grab bars - toilet Additional Comments: Pt's wife passed away 2.5 weeks ago.    Prior Function  Prior Level of Function : Independent/Modified Independent;Driving             Mobility Comments: indepdendent prior to recent hospitalizations.       Hand Dominance    Dominant Hand: Right    Extremity/Trunk Assessment   Upper Extremity Assessment Upper Extremity Assessment: Overall WFL for tasks assessed    Lower Extremity Assessment Lower Extremity Assessment: Overall WFL for tasks assessed    Cervical / Trunk Assessment Cervical / Trunk Assessment: Normal  Communication   Communication: HOH  Cognition Arousal/Alertness: Awake/alert Behavior During Therapy: WFL for tasks assessed/performed Overall Cognitive Status: Within Functional Limits for tasks assessed                                          General Comments General comments (skin integrity, edema, etc.): VSS on RA    Exercises     Assessment/Plan    PT Assessment Patient needs continued PT services  PT Problem List Decreased balance;Decreased mobility;Decreased knowledge of use of DME;Decreased safety awareness;Decreased activity tolerance       PT Treatment Interventions DME instruction;Gait training;Stair training;Therapeutic activities;Therapeutic exercise;Balance training;Patient/family education;Neuromuscular re-education;Functional mobility training    PT Goals (Current goals can be found in the Care Plan section)  Acute Rehab PT Goals Patient Stated Goal: home PT Goal Formulation: With patient Time For Goal Achievement: 05/26/21 Potential to Achieve Goals: Good    Frequency Min 3X/week   Barriers to discharge        Co-evaluation               AM-PAC PT "6 Clicks" Mobility  Outcome Measure Help needed turning from your back to your side while in a flat bed without using bedrails?: None Help needed moving from lying on your back to sitting on the side of a flat bed without using bedrails?: A Little Help needed moving to and from a bed to a chair (including a wheelchair)?: A Little Help needed standing up from a chair using your arms (e.g., wheelchair or bedside chair)?: A Little Help needed to walk in hospital room?: A Little Help  needed climbing 3-5 steps with a railing? : A Little 6 Click Score: 19    End of Session Equipment Utilized During Treatment: Gait belt Activity Tolerance: Patient tolerated treatment well Patient left: in chair;with call bell/phone within reach;with family/visitor present Nurse Communication: Mobility status PT Visit Diagnosis: Unsteadiness on feet (R26.81)    Time: 7903-8333 PT Time Calculation (min) (ACUTE ONLY): 21 min   Charges:   PT Evaluation $PT Eval Low Complexity: 1 Low          Lorrin Goodell, PT  Office # [phone] Pager [phone]   Lorriane Shire 05/12/2021, 12:15 PM

## 2021-05-13 LAB — BASIC METABOLIC PANEL
Anion gap: 5 (ref 5–15)
BUN: 12 mg/dL (ref 8–23)
CO2: 24 mmol/L (ref 22–32)
Calcium: 8.2 mg/dL — ABNORMAL LOW (ref 8.9–10.3)
Chloride: 108 mmol/L (ref 98–111)
Creatinine, Ser: 1.38 mg/dL — ABNORMAL HIGH (ref 0.61–1.24)
GFR, Estimated: 48 mL/min — ABNORMAL LOW (ref >60)
Glucose, Bld: 98 mg/dL (ref 70–99)
Potassium: 3.9 mmol/L (ref 3.5–5.1)
Sodium: 137 mmol/L (ref 135–145)

## 2021-05-13 LAB — CBC
HCT: 31.8 % — ABNORMAL LOW (ref 39.0–52.0)
Hemoglobin: 10.2 g/dL — ABNORMAL LOW (ref 13.0–17.0)
MCH: 30.4 pg (ref 26.0–34.0)
MCHC: 32.1 g/dL (ref 30.0–36.0)
MCV: 94.6 fL (ref 80.0–100.0)
Platelets: 261 10*3/uL (ref 150–400)
RBC: 3.36 MIL/uL — ABNORMAL LOW (ref 4.22–5.81)
RDW: 13.2 % (ref 11.5–15.5)
WBC: 8.9 10*3/uL (ref 4.0–10.5)
nRBC: 0 % (ref 0.0–0.2)

## 2021-05-13 MED ORDER — CEFPODOXIME PROXETIL 200 MG PO TABS: 200.0000 mg | ORAL_TABLET | Freq: Two times a day (BID) | ORAL | 0 refills | Status: DC

## 2021-05-13 MED ORDER — APIXABAN 2.5 MG PO TABS: 2.5000 mg | ORAL_TABLET | Freq: Two times a day (BID) | ORAL | 3 refills | Status: DC

## 2021-05-13 MED ORDER — AZITHROMYCIN 500 MG PO TABS: 500.0000 mg | ORAL_TABLET | Freq: Every day | ORAL | 0 refills | Status: DC

## 2021-05-13 NOTE — Progress Notes (Signed)
Discharge paperwork reviewed with pt and pt's daughter Tye [place]. Both verbalized understanding. Pt alert and oriented in no acute distress upon distress. Pt's daughter to transport pt home via private vehicle upon discharge. Pt has gathered all of his belongings.

## 2021-05-13 NOTE — TOC Transition Note (Signed)
Transition of Care [facility]) - CM/SW Discharge Note   Patient Details  Name: MAHAMADOU WELTZ MRN: [id number] Date of Birth: 01-01-1926  Transition of Care [facility]) CM/SW Contact:  Carles Collet, RN Phone Number: 05/13/2021, 9:07 AM   Clinical Narrative:    Thomas Hoff daughter [name]. Patient has all DME needed. Would like [facility] for [facility] services. Referral placed for them to call daughter [name] to schedule.  No other CM needs identified.     Final next level of care: [place] Barriers to Discharge: No Barriers Identified   Patient Goals and CMS Choice Patient states their goals for this hospitalization and ongoing recovery are:: return home CMS Medicare.gov Compare Post Acute Care list provided to:: Other (Comment Required) Choice offered to / list presented to : Adult Children  Discharge Placement                       Discharge Plan and Services                DME Arranged: N/A         HH Arranged: PT, OT HH Agency: [place] Date [facility] Agency Contacted: 05/13/21 Time [place]: 0907 Representative spoke with at [place]: [place] Determinants of Health ([place]) Interventions     Readmission Risk Interventions No flowsheet data found.

## 2021-05-13 NOTE — Discharge Summary (Signed)
Physician Discharge Summary   Patient ID: RAPHEL STICKLES MRN: [id number] DOB/AGE: Mar 13, 1926 87 y.o.  Admit date: 05/10/2021 Discharge date: 05/13/2021  Primary Care Physician:  [name], DO   Recommendations for Outpatient Follow-up:  Follow up with PCP in 1-2 weeks  Home Health: Home health PT OT Equipment/Devices:   Discharge Condition: stable  CODE STATUS: FULL  Diet recommendation: Heart healthy diet  Discharge Diagnoses:     Severe sepsis ([place]) Right lower lung pneumonia Urinary tract infection  Recent CVA (cerebral vascular accident) ([place]) Carotid artery disease status post ICA stent   Obstructive chronic bronchitis without exacerbation ([place])  Acute renal failure superimposed on stage 3b chronic kidney disease ([place])  Atrial fibrillation with RVR ([place]), new onset BPH Hypotension Generalized debility  Consults: None    Allergies:   Allergies  Allergen Reactions   Demerol [Meperidine] Other (See Comments)    Pt. States "he woke up during a colonoscopy"   Spiriva Handihaler [Tiotropium Bromide Monohydrate] Other (See Comments)    Mild Urinary Retention     DISCHARGE MEDICATIONS: Allergies as of 05/13/2021       Reactions   Demerol [meperidine] Other (See Comments)   Pt. States "he woke up during a colonoscopy"   Spiriva Handihaler [tiotropium Bromide Monohydrate] Other (See Comments)   Mild Urinary Retention        Medication List     STOP taking these medications    cephALEXin 500 MG capsule Commonly known as: Keflex   clopidogrel 75 MG tablet Commonly known as: PLAVIX       TAKE these medications    acetaminophen 325 MG tablet Commonly known as: TYLENOL Take 2 tablets (650 mg total) by mouth every 4 (four) hours as needed for mild pain (or temp > 37.5 C (99.5 F)).   apixaban 2.5 MG Tabs tablet Commonly known as: ELIQUIS Take 1 tablet (2.5 mg total) by mouth 2 (two) times daily.   aspirin 325 MG EC tablet Take 1  tablet (325 mg total) by mouth daily.   atorvastatin 40 MG tablet Commonly known as: LIPITOR Take 1 tablet (40 mg total) by mouth daily.   azelastine 0.1 % nasal spray Commonly known as: ASTELIN PLACE 1 SPRAY IN EACH NOSTRIL ONCE A DAY AS DIRECTED What changed:  how much to take how to take this when to take this additional instructions   azithromycin 500 MG tablet Commonly known as: Zithromax Take 1 tablet (500 mg total) by mouth daily for 5 days.    Breo Ellipta 200-25 MCG/ACT Aepb Generic drug: fluticasone furoate-vilanterol Inhale 1 puff into the lungs daily.   cefpodoxime 200 MG tablet Commonly known as: VANTIN Take 1 tablet (200 mg total) by mouth 2 (two) times daily for 5 days.   finasteride 5 MG tablet Commonly known as: PROSCAR Take 5 mg by mouth daily.   fluticasone 50 MCG/ACT nasal spray Commonly known as: FLONASE Place 2 sprays into both nostrils daily.   guaiFENesin 600 MG 12 hr tablet Commonly known as: MUCINEX Take 600 mg by mouth daily as needed for to loosen phlegm.   latanoprost 0.005 % ophthalmic solution Commonly known as: XALATAN Place 1 drop into the left eye at bedtime. What changed: how to take this   midodrine 5 MG tablet Commonly known as: PROAMATINE Take 1 tablet (5 mg total) by mouth 2 (two) times daily with a meal for 7 days.   pantoprazole 40 MG tablet Commonly known as: Protonix Take 1 tablet (40  mg total) by mouth daily.   silodosin 4 MG Caps capsule Commonly known as: RAPAFLO Take 4 mg by mouth daily with breakfast.   timolol 0.5 % ophthalmic solution Commonly known as: TIMOPTIC Place 1 drop into the left eye every morning.   Ventolin HFA 108 (90 Base) MCG/ACT inhaler Generic drug: albuterol INHALE 2 PUFFS EVERY 6 HOURS AS NEEDED FOR WHEEZING OR SHORTNESS OF BREATH What changed: See the new instructions.   Vitamin B-12 5000 MCG Subl Place 1 tablet under the tongue daily.   Vitamin D3 50 MCG (2000 UT) Tabs Take 1  tablet by mouth daily.               Discharge Care Instructions  (From admission, onward)           Start     Ordered   05/13/21 0000  If the dressing is still on your incision site when you go home, remove it on the third day after your surgery date. Remove dressing if it begins to fall off, or if it is dirty or damaged before the third day.        05/13/21 0827             Brief H and P: For complete details please refer to admission H and P, but in brief Patient is a 87 year old male with COPD, CKD 3B, BPH, recent admission for recurrent ischemic CVA status post left carotid angioplasty and stent on 05/04/2021 presented to ED with shaking chills.  Patient's daughter has been staying with him for past 2 weeks after his wife of 15 years died and noted that he seemed to be doing well until developing chills and then vomited on t day of admission.  Patient also reported dysuria and difficulty voiding.  He required in and out cath multiple times during recent hospitalization and had subsequently been voiding on his own.  Also reported back pain that he attributes to recent hospitalization, on does not radiate to the flanks back to his midline low back without lower extremity weakness or numbness.  Reported suprapubic discomfort but no abdominal pain.  He felt short of breath in triage in ED when he was hypotensive and in view of A. fib with RVR. In ED, febrile, O2 sats mid to upper 90s on room air, tachycardia to 120s, BP 87/54.  EKG showed A. fib with RVR.  Chest x-ray showed hazy opacity in the right lower lobe.  Creatinine 2.05, bicarb 18.  WBCs 15.7.  COVID and influenza PCR negative.    Hospital Course:  Severe sepsis secondary to UTI [facility]), present on admission, possible pneumonia -Lactic acid normal, patient met sepsis criteria at the time of admission with fevers, tachycardia, hypotension, acute kidney injury, creatinine 2.0 on admission -Source likely due to UTI and  pneumonia -Sepsis physiology has resolved,  Right lower lung pneumonia, HCAP -Chest x-ray showed right lower lobe hazy lung opacities concerning for pneumonia -Blood cultures negative so far. -Patient was placed on IV Zithromax, IV cefepime, he will continue 5 more days of Zithromax and Vantin to complete full course of antibiotics for 7 days     UTI -Patient presented with dysuria, difficulty voiding, unfortunately UA and culture were collected but not sent hence pending results. -Per patient's urologist, Dr. Jeffie Pollock, patient had urine culture on 12/12 which had shown multiple species  <10K colonies, had prophylactically recommended Keflex     Atrial fibrillation with RVR, new, paroxysmal -In ED, patient was noted to be  in A. fib with rate in 120s, likely respiratory due to #1 -Placed on eliquis per neurology recommendations given recent CVA -CHA2DS2-VASc score at least 5 (age, CVA, vascular disease) -Currently normal sinus rhythm, heart rate controlled   COPD, not in acute exacerbation -Currently no wheezing, stable   Recent CVA (cerebral vascular accident) ([place]), CAS status post ICA stent -Recently admitted with recurrent CVA and underwent left carotid angioplasty, stenting on 12/8 -Continue aspirin, started on eliquis - Plavix stopped per neurology recommendations     Acute renal failure superimposed on stage 3b chronic kidney disease ([facility]) -Creatinine 2.0 on admission, up from 1.6  -Likely acute prerenal azotemia in the setting of sepsis, UTI, PNA, also has BPH and required I's and O's cath during recent hospitalization -Creatinine improved, 1.3 at the time of discharge.   BPH -Continue tamsulosin, finasteride   Hypotension -Patient was started on midodrine during recent hospitalization and was going to start tapering with PCP -For now we will continue midodrine   Generalized debility -PT evaluation, recommended home health PT    Day of Discharge S: Doing well,  afebrile, no acute complaints.  Ready to go home  BP 136/73 (BP Location: Right Arm)    Pulse 65    Temp 97.9 F (36.6 C) (Oral)    Resp 18    Ht _0  (1.778 m)    Wt 82 kg    SpO2 97%    BMI 25.94 kg/m   Physical Exam: General: Alert and awake oriented x3 not in any acute distress. CVS: S1-S2 clear, RRR Chest: clear to auscultation bilaterally, no wheezing rales or rhonchi Abdomen: soft nontender, nondistended, normal bowel sounds Extremities: no cyanosis, clubbing or edema noted bilaterally Neuro: no new deficits    Get Medicines reviewed and adjusted: Please take all your medications with you for your next visit with your Primary MD  Please request your Primary MD to go over all hospital tests and procedure/radiological results at the follow up. Please ask your Primary MD to get all Hospital records sent to his/her office.  If you experience worsening of your admission symptoms, develop shortness of breath, life threatening emergency, suicidal or homicidal thoughts you must seek medical attention immediately by calling 911 or calling your MD immediately  if symptoms less severe.  You must read complete instructions/literature along with all the possible adverse reactions/side effects for all the Medicines you take and that have been prescribed to you. Take any new Medicines after you have completely understood and accept all the possible adverse reactions/side effects.   Do not drive when taking pain medications.   Do not take more than prescribed Pain, Sleep and Anxiety Medications  Special Instructions: If you have smoked or chewed Tobacco  in the last 2 yrs please stop smoking, stop any regular Alcohol  and or any Recreational drug use.  Wear Seat belts while driving.  Please note  You were cared for by a hospitalist during your hospital stay. Once you are discharged, your primary care physician will handle any further medical issues. Please note that NO REFILLS for any  discharge medications will be authorized once you are discharged, as it is imperative that you return to your primary care physician (or establish a relationship with a primary care physician if you do not have one) for your aftercare needs so that they can reassess your need for medications and monitor your lab values.   The results of significant diagnostics from this hospitalization (including imaging, microbiology, ancillary  and laboratory) are listed below for reference.      Procedures/Studies:  MR BRAIN WO CONTRAST  Result Date: 04/29/2021 CLINICAL DATA:  Acute neurologic deficit EXAM: MRI HEAD WITHOUT CONTRAST TECHNIQUE: Multiplanar, multiecho pulse sequences of the brain and surrounding structures were obtained without intravenous contrast. COMPARISON:  None. FINDINGS: Brain: There are 2 areas of acute infarction within the left frontal lobe, the larger of which is in the insula. No acute hemorrhage. Normal white matter signal. Generalized volume loss without a clear lobar predilection. Focus of chronic microhemorrhage in the right cerebellum the midline structures are normal. Vascular: Magnetic susceptibility effect within the left MCA likely indicates a site of thrombus. Skull and upper cervical spine: Normal calvarium and skull base. Visualized upper cervical spine and soft tissues are normal. Sinuses/Orbits:No paranasal sinus fluid levels or advanced mucosal thickening. No mastoid or middle ear effusion. Normal orbits. IMPRESSION: 1. Two areas of acute infarction within the left frontal lobe, the larger of which is in the insula. No hemorrhage or mass effect. 2. Magnetic susceptibility effect within the left MCA likely indicates a site of thrombus. Electronically Signed   By: Ulyses Jarred M.D.   On: 04/29/2021 19:16   MR BRAIN WO CONTRAST  Result Date: 04/28/2021 CLINICAL DATA:  Acute neurologic deficit EXAM: MRI HEAD WITHOUT CONTRAST TECHNIQUE: Multiplanar, multiecho pulse sequences of  the brain and surrounding structures were obtained without intravenous contrast. COMPARISON:  None. FINDINGS: Brain: Small focus of abnormal diffusion restriction in the left insula. No other diffusion abnormality. Focus of chronic microhemorrhage in the right cerebellum . Normal white matter signal. Generalized volume loss without a clear lobar predilection. The midline structures are normal. Vascular: Occluded left ICA.  The other flow voids are normal. Skull and upper cervical spine: Normal calvarium and skull base. Visualized upper cervical spine and soft tissues are normal. Sinuses/Orbits:No paranasal sinus fluid levels or advanced mucosal thickening. No mastoid or middle ear effusion. Normal orbits. IMPRESSION: 1. Small focus of acute ischemia in the left insula. No hemorrhage or mass effect. 2. Occluded left ICA. Electronically Signed   By: Ulyses Jarred M.D.   On: 04/28/2021 01:50   IR INTRAVSC STENT CERV CAROTID W/EMB-PROT MOD SED  Result Date: 05/05/2021 INDICATION: JALIN ALICEA is a 87 y.o. male with a past medical history significant for GERD, COPD, CKD III who presented to [facility] ED on 12/1 with complaints of word finding difficulties/unsteady gait. He was found to have a left ICA occlusion and no intervention was recommended as his deficits had resolved. He was discharged on 12/2 with instructions to begin Plavix and ASA, unfortunately he began to experience similar symptoms and returned to the ED on 12/3. He was again admitted and due to the presence of thrombus in the left ICA and resolution of symptoms decision was made to schedule like carotid angioplasty and stenting within a week. Unfortunately he began to experience slurred speech again about 6 hours later and he was brought back to the ED. Code stroke was activated and no acute abnormality was seen on imaging, he was found to be hypotensive for which he was given a fluid bolus and this improved his slurred speech. He comes to our service  today for diagnostic cerebral angiogram left carotid stenting with cerebral protection device. EXAM: ULTRASOUND-GUIDED VASCULAR ACCESS DIAGNOSTIC CEREBRAL ANGIOGRAM LEFT INTERNAL CAROTID ARTERY ANGIOPLASTY AND STENTING WITH CEREBRAL PROTECTION DEVICE COMPARISON:  CT/CT angiogram of the head and neck April 29, 2021. MEDICATIONS: Refer to anesthesia documentation. ANESTHESIA/SEDATION:  The procedure was performed under monitored anesthesia care (MAC). CONTRAST:  100 mL of Omnipaque 300 milligram/mL FLUOROSCOPY TIME:  Fluoroscopy Time: 22 minutes 18 seconds (393 mGy). COMPLICATIONS: None immediate. TECHNIQUE: Informed written consent was obtained from the patient after a thorough discussion of the procedural risks, benefits and alternatives. All questions were addressed. Maximal Sterile Barrier Technique was utilized including caps, mask, sterile gowns, sterile gloves, sterile drape, hand hygiene and skin antiseptic. A timeout was performed prior to the initiation of the procedure. The right groin was prepped and draped in the usual sterile fashion. Using a micropuncture kit and the modified Seldinger technique, access was gained to the right common femoral artery and an 8 French sheath was placed. Real-time ultrasound guidance was utilized for vascular access including the acquisition of a permanent ultrasound image documenting patency of the accessed vessel. Under fluoroscopy, an 8 [place] Walrus balloon guide catheter was navigated over a 6 [place] Berenstein 2 catheter and a 0.035" Terumo Glidewire into the aortic arch. The catheter was placed into the left common carotid artery. Frontal and lateral angiograms of the neck were obtained followed by frontal and lateral angiograms of the head. FINDINGS: 1. Atherosclerotic changes of the right common femoral artery with calcified plaque resulting in mild stenosis. Vessel diameter is adequate for vascular access. 2. Severe stenosis of the left internal carotid artery at  the distal aspect of the carotid bulb with near occlusion (99% stenosis). 3. There is delayed anterograde flow with minimal opacification of the left MCA vascular tree with occlusion of a left M3/MCA posterior division branch, corresponding to occluded branch on prior CT angiogram. 4. No contrast opacification of the right ACA vascular tree. PROCEDURE: Frontal and lateral angiograms of the neck were obtained and utilized as biplane roadmap. Then, and Emboshield NAV 6 cerebral protection device was navigated into the distal cervical segment of the left ICA. Then, left ICA angioplasty was performed with a 4 x 30 mm Viatrac balloon under fluoroscopy while the walrus balloon was inflated. Next, a 6-8 x 40 mm Xact carotid stent was deployed pain in the distal left common carotid artery and carotid bulb while the walrus balloon was inflated. Subsequently, the 4 x 30 mm Viatrac balloon was navigated in the recently deployed stent. In stent angioplasty was performed under fluoroscopy. Then, the cerebral protection device was recaptured. Left ICA angiograms with frontal lateral views of the neck showed brisk anterograde flow with complete resolution of stenosis. Frontal and lateral angiograms of the head showed improved contrast opacification of the left MCA vascular tree with new opacification of the left ACA vascular tree. Delayed left ICA angiograms with frontal and lateral views of the neck showed no evidence of in stent clot formation. The catheter was subsequently withdrawn. A right common femoral artery angiogram was obtained in right anterior oblique view. The sheath was exchanged over the wire for a Perclose pro style. Attempted femoral closure with the was Perclose unsuccessful. The device was then exchanged over the wire for an 8 [place] Angio-Seal which was utilized for access closure. Adequate hemostasis was achieved. IMPRESSION: Successful and uncomplicated cervical left internal carotid artery angioplasty and  stenting with cerebral protection device. No evidence of thromboembolic complication. PLAN: Continue on dual anti-platelet therapy with aspirin and Plavix. Carotid duplex in 3 months to evaluate for stent patency. Electronically Signed   By: Pedro Earls M.D.   On: 05/05/2021 16:08   IR US Guide Vasc Access Right  Result Date: 05/05/2021 INDICATION: Earnie Larsson  R Archey is a 87 y.o. male with a past medical history significant for GERD, COPD, CKD III who presented to [facility] ED on 12/1 with complaints of word finding difficulties/unsteady gait. He was found to have a left ICA occlusion and no intervention was recommended as his deficits had resolved. He was discharged on 12/2 with instructions to begin Plavix and ASA, unfortunately he began to experience similar symptoms and returned to the ED on 12/3. He was again admitted and due to the presence of thrombus in the left ICA and resolution of symptoms decision was made to schedule like carotid angioplasty and stenting within a week. Unfortunately he began to experience slurred speech again about 6 hours later and he was brought back to the ED. Code stroke was activated and no acute abnormality was seen on imaging, he was found to be hypotensive for which he was given a fluid bolus and this improved his slurred speech. He comes to our service today for diagnostic cerebral angiogram left carotid stenting with cerebral protection device. EXAM: ULTRASOUND-GUIDED VASCULAR ACCESS DIAGNOSTIC CEREBRAL ANGIOGRAM LEFT INTERNAL CAROTID ARTERY ANGIOPLASTY AND STENTING WITH CEREBRAL PROTECTION DEVICE COMPARISON:  CT/CT angiogram of the head and neck April 29, 2021. MEDICATIONS: Refer to anesthesia documentation. ANESTHESIA/SEDATION: The procedure was performed under monitored anesthesia care (MAC). CONTRAST:  100 mL of Omnipaque 300 milligram/mL FLUOROSCOPY TIME:  Fluoroscopy Time: 22 minutes 18 seconds (393 mGy). COMPLICATIONS: None immediate. TECHNIQUE: Informed  written consent was obtained from the patient after a thorough discussion of the procedural risks, benefits and alternatives. All questions were addressed. Maximal Sterile Barrier Technique was utilized including caps, mask, sterile gowns, sterile gloves, sterile drape, hand hygiene and skin antiseptic. A timeout was performed prior to the initiation of the procedure. The right groin was prepped and draped in the usual sterile fashion. Using a micropuncture kit and the modified Seldinger technique, access was gained to the right common femoral artery and an 8 French sheath was placed. Real-time ultrasound guidance was utilized for vascular access including the acquisition of a permanent ultrasound image documenting patency of the accessed vessel. Under fluoroscopy, an 8 [place] Walrus balloon guide catheter was navigated over a 6 [place] Berenstein 2 catheter and a 0.035" Terumo Glidewire into the aortic arch. The catheter was placed into the left common carotid artery. Frontal and lateral angiograms of the neck were obtained followed by frontal and lateral angiograms of the head. FINDINGS: 1. Atherosclerotic changes of the right common femoral artery with calcified plaque resulting in mild stenosis. Vessel diameter is adequate for vascular access. 2. Severe stenosis of the left internal carotid artery at the distal aspect of the carotid bulb with near occlusion (99% stenosis). 3. There is delayed anterograde flow with minimal opacification of the left MCA vascular tree with occlusion of a left M3/MCA posterior division branch, corresponding to occluded branch on prior CT angiogram. 4. No contrast opacification of the right ACA vascular tree. PROCEDURE: Frontal and lateral angiograms of the neck were obtained and utilized as biplane roadmap. Then, and Emboshield NAV 6 cerebral protection device was navigated into the distal cervical segment of the left ICA. Then, left ICA angioplasty was performed with a 4 x 30 mm  Viatrac balloon under fluoroscopy while the walrus balloon was inflated. Next, a 6-8 x 40 mm Xact carotid stent was deployed pain in the distal left common carotid artery and carotid bulb while the walrus balloon was inflated. Subsequently, the 4 x 30 mm Viatrac balloon was navigated in the  recently deployed stent. In stent angioplasty was performed under fluoroscopy. Then, the cerebral protection device was recaptured. Left ICA angiograms with frontal lateral views of the neck showed brisk anterograde flow with complete resolution of stenosis. Frontal and lateral angiograms of the head showed improved contrast opacification of the left MCA vascular tree with new opacification of the left ACA vascular tree. Delayed left ICA angiograms with frontal and lateral views of the neck showed no evidence of in stent clot formation. The catheter was subsequently withdrawn. A right common femoral artery angiogram was obtained in right anterior oblique view. The sheath was exchanged over the wire for a Perclose pro style. Attempted femoral closure with the was Perclose unsuccessful. The device was then exchanged over the wire for an 8 [place] Angio-Seal which was utilized for access closure. Adequate hemostasis was achieved. IMPRESSION: Successful and uncomplicated cervical left internal carotid artery angioplasty and stenting with cerebral protection device. No evidence of thromboembolic complication. PLAN: Continue on dual anti-platelet therapy with aspirin and Plavix. Carotid duplex in 3 months to evaluate for stent patency. Electronically Signed   By: Pedro Earls M.D.   On: 05/05/2021 16:08   DG Chest Port 1 View  Result Date: 05/10/2021 CLINICAL DATA:  Shortness of breath EXAM: PORTABLE CHEST 1 VIEW COMPARISON:  None. FINDINGS: The heart is normal in size. Atherosclerotic calcification of aortic arch. Right lower lobe hazy pulmonary opacities concerning for airspace disease. No pleural effusion or  pneumothorax. IMPRESSION: Right lower lobe hazy lung opacities concerning for pneumonia. Follow-up examination to resolution is recommended. Electronically Signed   By: Keane Police D.O.   On: 05/10/2021 17:48   ECHOCARDIOGRAM COMPLETE  Result Date: 04/28/2021    ECHOCARDIOGRAM REPORT   Patient Name:   WEILAND TOMICH Date of Exam: 04/28/2021 Medical Rec #:  [id number]     Height:       70.0 in Accession #:    [id number]    Weight:       170.0 lb Date of Birth:  [birth year]     BSA:          1.948 m Patient Age:    44 years      BP:           158/79 mmHg Patient Gender: M             HR:           63 bpm. Exam Location:  Inpatient Procedure: 2D Echo, Color Doppler and Cardiac Doppler Indications:    Stroke i63.9  History:        Patient has no prior history of Echocardiogram examinations.                 COPD.  Sonographer:    Raquel Sarna Senior RDCS Referring Phys: [id number] Candace Gallus MELVIN  Sonographer Comments: Technically difficult due to poor echo windows, repositioned at end of study for wall motion. IMPRESSIONS  1. Technically difficult study with limited views. Left ventricular ejection fraction, by estimation, is 60 to 65%. The left ventricle has normal function. The left ventricle has no regional wall motion abnormalities. Left ventricular diastolic parameters are consistent with Grade II diastolic dysfunction (pseudonormalization). Elevated left atrial pressure.  2. Right ventricle is poorly visualized but appears grossly normal systolic function and mild to moderately dilated  3. Left atrial size was mildly dilated.  4. The mitral valve is normal in structure. No evidence of mitral valve regurgitation.  5. The aortic valve was  not well visualized. Aortic valve regurgitation is not visualized. No aortic stenosis is present. FINDINGS  Left Ventricle: Left ventricular ejection fraction, by estimation, is 60 to 65%. The left ventricle has normal function. The left ventricle has no regional wall motion  abnormalities. The left ventricular internal cavity size was normal in size. There is  no left ventricular hypertrophy. Left ventricular diastolic parameters are consistent with Grade II diastolic dysfunction (pseudonormalization). Elevated left atrial pressure. Right Ventricle: The right ventricular size is moderately enlarged. Right vetricular wall thickness was not well visualized. Right ventricular systolic function is normal. Left Atrium: Left atrial size was mildly dilated. Right Atrium: Right atrial size was normal in size. Pericardium: There is no evidence of pericardial effusion. Mitral Valve: The mitral valve is normal in structure. No evidence of mitral valve regurgitation. Tricuspid Valve: The tricuspid valve is normal in structure. Tricuspid valve regurgitation is trivial. Aortic Valve: The aortic valve was not well visualized. Aortic valve regurgitation is not visualized. No aortic stenosis is present. Pulmonic Valve: The pulmonic valve was not well visualized. Pulmonic valve regurgitation is not visualized. Aorta: The aortic root is normal in size and structure. IAS/Shunts: The interatrial septum was not well visualized.  LEFT VENTRICLE PLAX 2D LVIDd:         4.20 cm   Diastology LVIDs:         2.70 cm   LV e' medial:    4.20 cm/s LV PW:         1.00 cm   LV E/e' medial:  13.5 LV IVS:        0.80 cm   LV e' lateral:   3.90 cm/s LVOT diam:     2.00 cm   LV E/e' lateral: 14.5 LV SV:         54 LV SV Index:   28 LVOT Area:     3.14 cm  RIGHT VENTRICLE RV S prime:     11.10 cm/s TAPSE (M-mode): 2.0 cm LEFT ATRIUM             Index        RIGHT ATRIUM           Index LA diam:        2.70 cm 1.39 cm/m   RA Area:     15.50 cm LA Vol (A2C):   69.9 ml 35.88 ml/m  RA Volume:   39.10 ml  20.07 ml/m LA Vol (A4C):   39.5 ml 20.28 ml/m LA Biplane Vol: 70.0 ml 35.93 ml/m  AORTIC VALVE LVOT Vmax:   66.10 cm/s LVOT Vmean:  49.600 cm/s LVOT VTI:    0.171 m  AORTA Ao Root diam: 3.30 cm MITRAL VALVE                TRICUSPID VALVE MV Area (PHT): 2.29 cm    TR Peak grad:   27.7 mmHg MV Decel Time: 331 msec    TR Vmax:        263.00 cm/s MV E velocity: 56.60 cm/s MV A velocity: 70.20 cm/s  SHUNTS MV E/A ratio:  0.81        Systemic VTI:  0.17 m                            Systemic Diam: 2.00 cm Oswaldo Milian MD Electronically signed by Oswaldo Milian MD Signature Date/Time: 04/28/2021/1:56:04 PM    Final    CT HEAD CODE STROKE WO  CONTRAST  Result Date: 05/01/2021 CLINICAL DATA:  Code stroke.  Acute neurologic deficit EXAM: CT HEAD WITHOUT CONTRAST TECHNIQUE: Contiguous axial images were obtained from the base of the skull through the vertex without intravenous contrast. COMPARISON:  Brain MRI 08/06/2020 FINDINGS: Brain: There is no mass, hemorrhage or extra-axial collection. There is generalized atrophy without lobar predilection. There is hypoattenuation of the periventricular white matter, most commonly indicating chronic ischemic microangiopathy. Hypoattenuation of the site of recent left insular infarct. Vascular: No abnormal hyperdensity of the major intracranial arteries or dural venous sinuses. No intracranial atherosclerosis. Skull: The visualized skull base, calvarium and extracranial soft tissues are normal. Sinuses/Orbits: No fluid levels or advanced mucosal thickening of the visualized paranasal sinuses. No mastoid or middle ear effusion. The orbits are normal. ASPECTS [facility] Stroke Program Early CT Score) - Ganglionic level infarction (caudate, lentiform nuclei, internal capsule, insula, M1-M3 cortex): 7 - Supraganglionic infarction (M4-M6 cortex): 3 Total score (0-10 with 10 being normal): 10 IMPRESSION: 1. No acute intracranial abnormality. 2. ASPECTS is 10. 3. A recent left insular infarct, as demonstrated on MRI 04/29/2021. These results were called by telephone at the time of interpretation on 05/01/2021 at 8:41 pm to Dr. Erlinda Hong, Who verbally acknowledged these results. Electronically Signed   By:  Ulyses Jarred M.D.   On: 05/01/2021 20:42   CT HEAD CODE STROKE WO CONTRAST`  Result Date: 04/29/2021 CLINICAL DATA:  Code stroke. 87 year old male with aphasia. Occluded left internal carotid artery and left MCA M2 branches on CTA 2 days ago. EXAM: CT HEAD WITHOUT CONTRAST TECHNIQUE: Contiguous axial images were obtained from the base of the skull through the vertex without intravenous contrast. COMPARISON:  [facility] CT head, CTA head and neck 04/27/2021. Brain MRI yesterday. FINDINGS: Brain: Small area of cytotoxic edema in the left insula corresponding to abnormal diffusion yesterday. Nearby hyperdense left MCA M2 branch in the sylvian fissure persist. But elsewhere gray-white matter differentiation remains stable since 04/27/2021. No acute intracranial hemorrhage identified. No midline shift, mass effect, or evidence of intracranial mass lesion. No ventriculomegaly. Vascular: Calcified atherosclerosis at the skull base. Persistent hyperdense left MCA M2 branch. Skull: No acute osseous abnormality identified. Sinuses/Orbits: Visualized paranasal sinuses and mastoids are stable and well aerated. Other: No acute orbit or scalp soft tissue finding. ASPECTS [facility] Stroke Program Early CT Score) Total score (0-10 with 10 being normal): 9 (abnormal left insula as seen by MRI yesterday). IMPRESSION: 1. Expected CT appearance of the small left insular infarct on MRI yesterday. Elsewhere stable gray-white matter differentiation since the CT on 04/27/2021. ASPECTS 9. 2. No intracranial hemorrhage or mass effect. Study discussed by telephone with Dr. Davonna Belling on 04/29/2021 at 09:19 . Electronically Signed   By: Genevie Ann M.D.   On: 04/29/2021 09:20   CT HEAD CODE STROKE WO CONTRAST  Result Date: 04/27/2021 CLINICAL DATA:  Code stroke.  Acute neuro deficit. EXAM: CT HEAD WITHOUT CONTRAST TECHNIQUE: Contiguous axial images were obtained from the base of the skull through the vertex without  intravenous contrast. COMPARISON:  MRI head 05/14/2013.  CT head 03/11/2009 FINDINGS: Brain: Generalized atrophy. Negative for hydrocephalus. Negative for acute infarct, hemorrhage, mass. Vascular: Hyperdensity of the left middle cerebral artery in the sylvian fissure. Probable left M2 branch occlusion. Correlate with symptoms. Skull: Negative Sinuses/Orbits: Mild mucosal edema right maxillary sinus. Bilateral cataract extraction Other: None ASPECTS ([place] Stroke Program Early CT Score) - Ganglionic level infarction (caudate, lentiform nuclei, internal capsule, insula, M1-M3 cortex): 7 - Supraganglionic  infarction (M4-M6 cortex): 3 Total score (0-10 with 10 being normal): 10 IMPRESSION: 1. Negative for acute infarct or hemorrhage 2. Hyperdense left MCA branch in the sylvian fissure likely due to acute thrombosis. Correlate with symptoms. 3. ASPECTS is 10 4. Code stroke imaging results were communicated on 04/27/2021 at 6:39 pm to provider Lindzen via text page amion Electronically Signed   By: Franchot Gallo M.D.   On: 04/27/2021 18:46   CT ANGIO HEAD NECK W WO CM (CODE STROKE)  Result Date: 04/27/2021 CLINICAL DATA:  Acute neuro deficit. Speech abnormality which has cleared. EXAM: CT ANGIOGRAPHY HEAD AND NECK TECHNIQUE: Multidetector CT imaging of the head and neck was performed using the standard protocol during bolus administration of intravenous contrast. Multiplanar CT image reconstructions and MIPs were obtained to evaluate the vascular anatomy. Carotid stenosis measurements (when applicable) are obtained utilizing NASCET criteria, using the distal internal carotid diameter as the denominator. CONTRAST:  75m OMNIPAQUE IOHEXOL 350 MG/ML SOLN COMPARISON:  CT head 04/27/2021 FINDINGS: CTA NECK FINDINGS Aortic arch: Atherosclerotic aortic arch and proximal great vessels. Proximal great vessels are patent. Right carotid system: Atherosclerotic calcification right carotid bifurcation without significant  stenosis Left carotid system: Atherosclerotic disease at the left carotid bifurcation. Occlusion of the proximal left internal carotid artery. Left external carotid artery patent. Vertebral arteries: Both vertebral arteries are patent to the skull base with mild stenosis proximally on the right. Skeleton: Cervical spondylosis.  No acute skeletal abnormality. Other neck: Negative for mass or adenopathy Upper chest: Lung apices clear bilaterally. Review of the MIP images confirms the above findings CTA HEAD FINDINGS Anterior circulation: Right cavernous carotid widely patent. Right anterior and right middle cerebral arteries widely patent. Left internal carotid artery is occluded with reconstitution of the supraclinoid segment. Left anterior cerebral artery patent. Left M1 segment patent. Occlusion of the proximal left M2 branch inferior division which appears acute. Posterior circulation: Both vertebral arteries patent to the basilar. PICA patent bilaterally. Basilar widely patent. Superior cerebellar and posterior cerebral arteries patent bilaterally. Venous sinuses: Normal venous enhancement Anatomic variants: None Review of the MIP images confirms the above findings IMPRESSION: 1. Occlusion of the proximal left internal carotid artery of indeterminate age. However, this is likely acute given the acute occlusion of the inferior division of the left MCA M2 branch. This was hyperdense on CT consistent with acute thrombus. 2. No significant right carotid stenosis. No significant vertebral stenosis. 3. These results were called by telephone at the time of interpretation on 04/27/2021 at 7:24 pm [facility] who verbally acknowledged these results. Electronically Signed   By: CFranchot GalloM.D.   On: 04/27/2021 19:29   CT ANGIO HEAD NECK W WO CM W PERF (CODE STROKE)  Result Date: 04/29/2021 CLINICAL DATA:  Neuro deficit, acute, stroke suspected EXAM: CT ANGIOGRAPHY HEAD AND NECK CT PERFUSION BRAIN  TECHNIQUE: Multidetector CT imaging of the head and neck was performed using the standard protocol during bolus administration of intravenous contrast. Multiplanar CT image reconstructions and MIPs were obtained to evaluate the vascular anatomy. Carotid stenosis measurements (when applicable) are obtained utilizing NASCET criteria, using the distal internal carotid diameter as the denominator. Multiphase CT imaging of the brain was performed following IV bolus contrast injection. Subsequent parametric perfusion maps were calculated using RAPID software. CONTRAST:  1031mOMNIPAQUE IOHEXOL 350 MG/ML SOLN COMPARISON:  Recent CTA and MRI brain FINDINGS: CTA NECK Aortic arch: Calcified plaque along the arch and patent great vessel origins. Right carotid system:  Patent. Stable appearance. No hemodynamically significant stenosis at the ICA origin. Left carotid system: Patent common carotid. There has been some recanalization of the internal carotid artery. Persistent mixed plaque including irregular noncalcified plaque along the proximal ICA with superimposed thrombus. There is focal near occlusion with submillimeter residual lumen. Vessel caliber remains small reflecting flow limitation. Vertebral arteries: Patent.  Stable appearance. Skeleton: Stable advanced cervical spine degenerative changes. Other neck: No new abnormality. Upper chest: Emphysema.  No new abnormality. Review of the MIP images confirms the above findings CTA HEAD Anterior circulation: As in the neck, there is no flow present within the left ICA. Left M1 MCA is patent. Persistent left M2 MCA occlusion although now slightly more distal. There is some reconstitution at the M2-M3 junction as before. Right middle and both anterior cerebral arteries are patent. Posterior circulation: Intracranial vertebral arteries are patent. Basilar artery is patent. Major cerebellar artery origins are patent. Posterior cerebral arteries are patent. Venous sinuses: Patent  as allowed by contrast bolus timing. Review of the MIP images confirms the above findings CT Brain Perfusion Findings: CBF (<30%) Volume: 102m Perfusion (Tmax>6.0s) volume: 815mMismatch Volume: 8362mnfarction Location: Left MCA territory IMPRESSION: Interval partial recanalization of the cervical left ICA. Remains focal near occlusion with submillimeter lumen and decreased distal vessel caliber reflecting flow limitation. Intracranially, the left ICA is now patent. There is a persistent left M2 MCA occlusion, but the occlusion now occurs more distally within the sylvian fissure. Some distal reconstitution is present. Perfusion imaging demonstrates no core infarction and 83 mL of penumbra in the left MCA territory. Electronically Signed   By: PraMacy MisD.   On: 04/29/2021 10:01   IR ANGIO INTRA EXTRACRAN SEL INTERNAL CAROTID UNI L MOD SED  Result Date: 05/05/2021 INDICATION: LloKHALIN ROYCE a [age]o. male with a past medical history significant for GERD, COPD, CKD III who [facility] with complaints of word finding difficulties/unsteady gait. He was found to have a left ICA occlusion and no intervention was recommended as his deficits had resolved. He was discharged on 12/2 with instructions to begin Plavix and ASA, unfortunately he began to experience similar symptoms and returned to the ED on 12/3. He was again admitted and due to the presence of thrombus in the left ICA and resolution of symptoms decision was made to schedule like carotid angioplasty and stenting within a week. Unfortunately he began to experience slurred speech again about 6 hours later and he was brought back to the ED. Code stroke was activated and no acute abnormality was seen on imaging, he was found to be hypotensive for which he was given a fluid bolus and this improved his slurred speech. He comes to our service today for diagnostic cerebral angiogram left carotid stenting with cerebral protection device. EXAM:  ULTRASOUND-GUIDED VASCULAR ACCESS DIAGNOSTIC CEREBRAL ANGIOGRAM LEFT INTERNAL CAROTID ARTERY ANGIOPLASTY AND STENTING WITH CEREBRAL PROTECTION DEVICE COMPARISON:  CT/CT angiogram of the head and neck April 29, 2021. MEDICATIONS: Refer to anesthesia documentation. ANESTHESIA/SEDATION: The procedure was performed under monitored anesthesia care (MAC). CONTRAST:  100 mL of Omnipaque 300 milligram/mL FLUOROSCOPY TIME:  Fluoroscopy Time: 22 minutes 18 seconds (393 mGy). COMPLICATIONS: None immediate. TECHNIQUE: Informed written consent was obtained from the patient after a thorough discussion of the procedural risks, benefits and alternatives. All questions were addressed. Maximal Sterile Barrier Technique was utilized including caps, mask, sterile gowns, sterile gloves, sterile drape, hand hygiene and skin antiseptic. A timeout was  performed prior to the initiation of the procedure. The right groin was prepped and draped in the usual sterile fashion. Using a micropuncture kit and the modified Seldinger technique, access was gained to the right common femoral artery and an 8 French sheath was placed. Real-time ultrasound guidance was utilized for vascular access including the acquisition of a permanent ultrasound image documenting patency of the accessed vessel. Under fluoroscopy, an 8 [place] Walrus balloon guide catheter was navigated over a 6 [place] Berenstein 2 catheter and a 0.035" Terumo Glidewire into the aortic arch. The catheter was placed into the left common carotid artery. Frontal and lateral angiograms of the neck were obtained followed by frontal and lateral angiograms of the head. FINDINGS: 1. Atherosclerotic changes of the right common femoral artery with calcified plaque resulting in mild stenosis. Vessel diameter is adequate for vascular access. 2. Severe stenosis of the left internal carotid artery at the distal aspect of the carotid bulb with near occlusion (99% stenosis). 3. There is delayed  anterograde flow with minimal opacification of the left MCA vascular tree with occlusion of a left M3/MCA posterior division branch, corresponding to occluded branch on prior CT angiogram. 4. No contrast opacification of the right ACA vascular tree. PROCEDURE: Frontal and lateral angiograms of the neck were obtained and utilized as biplane roadmap. Then, and Emboshield NAV 6 cerebral protection device was navigated into the distal cervical segment of the left ICA. Then, left ICA angioplasty was performed with a 4 x 30 mm Viatrac balloon under fluoroscopy while the walrus balloon was inflated. Next, a 6-8 x 40 mm Xact carotid stent was deployed pain in the distal left common carotid artery and carotid bulb while the walrus balloon was inflated. Subsequently, the 4 x 30 mm Viatrac balloon was navigated in the recently deployed stent. In stent angioplasty was performed under fluoroscopy. Then, the cerebral protection device was recaptured. Left ICA angiograms with frontal lateral views of the neck showed brisk anterograde flow with complete resolution of stenosis. Frontal and lateral angiograms of the head showed improved contrast opacification of the left MCA vascular tree with new opacification of the left ACA vascular tree. Delayed left ICA angiograms with frontal and lateral views of the neck showed no evidence of in stent clot formation. The catheter was subsequently withdrawn. A right common femoral artery angiogram was obtained in right anterior oblique view. The sheath was exchanged over the wire for a Perclose pro style. Attempted femoral closure with the was Perclose unsuccessful. The device was then exchanged over the wire for an 8 [place] Angio-Seal which was utilized for access closure. Adequate hemostasis was achieved. IMPRESSION: Successful and uncomplicated cervical left internal carotid artery angioplasty and stenting with cerebral protection device. No evidence of thromboembolic complication. PLAN:  Continue on dual anti-platelet therapy with aspirin and Plavix. Carotid duplex in 3 months to evaluate for stent patency. Electronically Signed   By: Pedro Earls M.D.   On: 05/05/2021 16:08      LAB RESULTS: Basic Metabolic Panel: Recent Labs  Lab 05/12/21 0143 05/13/21 0131  NA 137 137  K 4.2 3.9  CL 111 108  CO2 20* 24  GLUCOSE 98 98  BUN 17 12  CREATININE 1.55* 1.38*  CALCIUM 8.1* 8.2*   Liver Function Tests: Recent Labs  Lab 05/10/21 1117 05/10/21 1702  AST 20 22  ALT 14 18  ALKPHOS 105 86  BILITOT 0.3 0.8  PROT 6.0 6.4*  ALBUMIN 3.9 3.4*   No results for  input(s): LIPASE, AMYLASE in the last 168 hours. No results for input(s): AMMONIA in the last 168 hours. CBC: Recent Labs  Lab 05/10/21 1702 05/10/21 1709 05/12/21 0143 05/13/21 0131  WBC 15.7*   < > 9.0 8.9  NEUTROABS 13.9*  --   --   --   HGB 13.2   < > 9.9* 10.2*  HCT 41.1   < > 30.1* 31.8*  MCV 95.4   < > 95.0 94.6  PLT 331   < > 244 261   < > = values in this interval not displayed.   Cardiac Enzymes: No results for input(s): CKTOTAL, CKMB, CKMBINDEX, TROPONINI in the last 168 hours. BNP: Invalid input(s): POCBNP CBG: No results for input(s): GLUCAP in the last 168 hours.     Disposition and Follow-up: Discharge Instructions     Diet - low sodium heart healthy   Complete by: As directed    If the dressing is still on your incision site when you go home, remove it on the third day after your surgery date. Remove dressing if it begins to fall off, or if it is dirty or damaged before the third day.   Complete by: As directed    Increase activity slowly   Complete by: As directed         DISPOSITION: Home with home health   [place], [place], DO. Schedule an appointment as soon as possible for a visit in 2 week(s).   Specialty: Family Medicine Contact information: [place]  [phone]         Care, [facility] Follow up.   Specialty: Home Health Services Contact information: [place] [address] [phone]                  Time coordinating discharge:  35 minutes  Signed:   Estill Cotta M.D. [facility] 05/13/2021, 2:20 PM

## 2021-05-13 NOTE — Progress Notes (Signed)
Physical Therapy Treatment Patient Details Name: Charles Golden MRN: [id number] DOB: 03-27-1926 Today's Date: 05/13/2021   History of Present Illness 87 y.o. male with medical history significant for COPD, CKD 3B, BPH, and recent admission with recurrent ischemic CVA status post left carotid angioplasty and stent on 05/04/2021, presenting to the emergency department 12/14 with shaking chills. Admitted for sepsis secondary to UTI.    PT Comments    Pt making steady progress towards his physical therapy goals; demonstrates good activity tolerance and increased ambulation distance. Pt ambulating 250 feet with a walker at a min guard assist. SpO2 98% on RA, HR 80's. Pt will have good family support upon d/c home. Recommend follow up HHPT to address further strengthening, balance and functional mobility.     Recommendations for follow up therapy are one component of a multi-disciplinary discharge planning process, led by the attending physician.  Recommendations may be updated based on patient status, additional functional criteria and insurance authorization.  Follow Up Recommendations  Home health PT     Assistance Recommended at Discharge Frequent or constant Supervision/Assistance  Equipment Recommendations  None recommended by PT    Recommendations for Other Services       Precautions / Restrictions Precautions Precautions: Fall Restrictions Weight Bearing Restrictions: No     Mobility  Bed Mobility Overal bed mobility: Needs Assistance Bed Mobility: Supine to Sit     Supine to sit: Supervision          Transfers Overall transfer level: Needs assistance Equipment used: Rolling walker (2 wheels) Transfers: Sit to/from Stand Sit to Stand: Supervision                Ambulation/Gait Ambulation/Gait assistance: Min guard Gait Distance (Feet): 250 Feet Assistive device: Rolling walker (2 wheels) Gait Pattern/deviations: Step-through pattern       General Gait  Details: Increased cadance, slightly flexed trunk posture, min reliance on RW for balance. Overall min guard for safety. no overt LOB noted   Stairs             Wheelchair Mobility    Modified Rankin (Stroke Patients Only)       Balance Overall balance assessment: Needs assistance Sitting-balance support: No upper extremity supported;Feet supported Sitting balance-Leahy Scale: Normal     Standing balance support: No upper extremity supported;Bilateral upper extremity supported;During functional activity Standing balance-Leahy Scale: Fair                              Cognition Arousal/Alertness: Awake/alert Behavior During Therapy: WFL for tasks assessed/performed Overall Cognitive Status: Within Functional Limits for tasks assessed                                          Exercises      General Comments        Pertinent Vitals/Pain Pain Assessment: Faces Faces Pain Scale: No hurt    Home Living                          Prior Function            PT Goals (current goals can now be found in the care plan section) Acute Rehab PT Goals Patient Stated Goal: home Potential to Achieve Goals: Good Progress towards PT goals: Progressing toward goals  Frequency    Min 3X/week      PT Plan Current plan remains appropriate    Co-evaluation              AM-PAC PT "6 Clicks" Mobility   Outcome Measure  Help needed turning from your back to your side while in a flat bed without using bedrails?: None Help needed moving from lying on your back to sitting on the side of a flat bed without using bedrails?: A Little Help needed moving to and from a bed to a chair (including a wheelchair)?: A Little Help needed standing up from a chair using your arms (e.g., wheelchair or bedside chair)?: A Little Help needed to walk in hospital room?: A Little Help needed climbing 3-5 steps with a railing? : A Little 6 Click  Score: 19    End of Session Equipment Utilized During Treatment: Gait belt Activity Tolerance: Patient tolerated treatment well Patient left: in bed;with call bell/phone within reach;with bed alarm set;with family/visitor present Nurse Communication: Mobility status PT Visit Diagnosis: Unsteadiness on feet (R26.81)     Time: 2595-6387 PT Time Calculation (min) (ACUTE ONLY): 19 min  Charges:  $Therapeutic Activity: 8-22 mins                     Wyona Almas, PT, DPT Acute Rehabilitation Services Pager [phone] Office [phone]    Deno Etienne 05/13/2021, 1:40 PM

## 2021-05-15 ENCOUNTER — Telehealth: Payer: Self-pay

## 2021-05-15 LAB — CULTURE, BLOOD (ROUTINE X 2)
Culture: NO GROWTH
Culture: NO GROWTH
Special Requests: ADEQUATE
Special Requests: ADEQUATE

## 2021-05-15 NOTE — Telephone Encounter (Signed)
Transition Care Management Follow-up Telephone Call Date of discharge and from where: [facility] 05/13/21 - Sepsis due to UTI and PNA How have you been since you were released from the hospital? Doing well physically - mentally still struggling with loss of wife - has appt with counselor coming up Any questions or concerns? Yes - still haven't heard from [facility] - this is the second time it was ordered this month  Items Reviewed: Did the pt receive and understand the discharge instructions provided? Yes  Medications obtained and verified? Yes  Other? No  Any new allergies since your discharge? No  Dietary orders reviewed? Yes Do you have support at home? Yes   Home Care and Equipment/Supplies: Were home health services ordered? yes If so, what is the name of the agency? [facility]  Has the agency set up a time to come to the patient's home? no Were any new equipment or medical supplies ordered?  No What is the name of the medical supply agency? N/a Were you able to get the supplies/equipment? not applicable Do you have any questions related to the use of the equipment or supplies? No  Functional Questionnaire: (I = Independent and D = Dependent) ADLs: I - with some assistance  Bathing/Dressing- I  Meal Prep- D  Eating- I  Maintaining continence- I  Transferring/Ambulation- I - with walker  Managing Meds- D  Follow up appointments reviewed:  PCP Hospital f/u appt confirmed? Yes  Scheduled to see [name] not available) on 12/22 @ 12:05. [facility] f/u appt confirmed? Yes  Scheduled to see Neurologist on 06/22/2021 @ 11:30. Are transportation arrangements needed? No  If their condition worsens, is the pt aware to call PCP or go to the Emergency Dept.? Yes Was the patient provided with contact information for the PCP's office or ED? Yes Was to pt encouraged to call back with questions or concerns? Yes

## 2021-05-17 ENCOUNTER — Telehealth: Payer: Self-pay | Admitting: Family Medicine

## 2021-05-17 DIAGNOSIS — N1832 Chronic kidney disease, stage 3b: Secondary | ICD-10-CM | POA: Diagnosis not present

## 2021-05-17 DIAGNOSIS — N390 Urinary tract infection, site not specified: Secondary | ICD-10-CM | POA: Diagnosis not present

## 2021-05-17 DIAGNOSIS — M47812 Spondylosis without myelopathy or radiculopathy, cervical region: Secondary | ICD-10-CM | POA: Diagnosis not present

## 2021-05-17 DIAGNOSIS — I959 Hypotension, unspecified: Secondary | ICD-10-CM | POA: Diagnosis not present

## 2021-05-17 DIAGNOSIS — I129 Hypertensive chronic kidney disease with stage 1 through stage 4 chronic kidney disease, or unspecified chronic kidney disease: Secondary | ICD-10-CM | POA: Diagnosis not present

## 2021-05-17 DIAGNOSIS — J181 Lobar pneumonia, unspecified organism: Secondary | ICD-10-CM | POA: Diagnosis not present

## 2021-05-17 DIAGNOSIS — R000 Tachycardia, unspecified: Secondary | ICD-10-CM | POA: Diagnosis not present

## 2021-05-17 DIAGNOSIS — Z9181 History of falling: Secondary | ICD-10-CM | POA: Diagnosis not present

## 2021-05-17 DIAGNOSIS — I082 Rheumatic disorders of both aortic and tricuspid valves: Secondary | ICD-10-CM | POA: Diagnosis not present

## 2021-05-17 DIAGNOSIS — Z7982 Long term (current) use of aspirin: Secondary | ICD-10-CM | POA: Diagnosis not present

## 2021-05-17 DIAGNOSIS — A419 Sepsis, unspecified organism: Secondary | ICD-10-CM | POA: Diagnosis not present

## 2021-05-17 DIAGNOSIS — N400 Enlarged prostate without lower urinary tract symptoms: Secondary | ICD-10-CM | POA: Diagnosis not present

## 2021-05-17 DIAGNOSIS — Z79899 Other long term (current) drug therapy: Secondary | ICD-10-CM | POA: Diagnosis not present

## 2021-05-17 DIAGNOSIS — J440 Chronic obstructive pulmonary disease with acute lower respiratory infection: Secondary | ICD-10-CM | POA: Diagnosis not present

## 2021-05-17 DIAGNOSIS — Z7901 Long term (current) use of anticoagulants: Secondary | ICD-10-CM | POA: Diagnosis not present

## 2021-05-17 DIAGNOSIS — Z8673 Personal history of transient ischemic attack (TIA), and cerebral infarction without residual deficits: Secondary | ICD-10-CM | POA: Diagnosis not present

## 2021-05-17 DIAGNOSIS — Z48812 Encounter for surgical aftercare following surgery on the circulatory system: Secondary | ICD-10-CM | POA: Diagnosis not present

## 2021-05-17 DIAGNOSIS — I480 Paroxysmal atrial fibrillation: Secondary | ICD-10-CM | POA: Diagnosis not present

## 2021-05-17 DIAGNOSIS — Z7951 Long term (current) use of inhaled steroids: Secondary | ICD-10-CM | POA: Diagnosis not present

## 2021-05-17 DIAGNOSIS — K219 Gastro-esophageal reflux disease without esophagitis: Secondary | ICD-10-CM | POA: Diagnosis not present

## 2021-05-17 DIAGNOSIS — N179 Acute kidney failure, unspecified: Secondary | ICD-10-CM | POA: Diagnosis not present

## 2021-05-17 NOTE — Telephone Encounter (Signed)
Aware that ppw maybe able to be signed tomorrow at pt's appt, if not it will be Tuesday when Dr. [name] returns to [place] office. Papers are currently on Michelle's desk

## 2021-05-18 ENCOUNTER — Encounter: Payer: Self-pay | Admitting: Family Medicine

## 2021-05-18 ENCOUNTER — Ambulatory Visit (INDEPENDENT_AMBULATORY_CARE_PROVIDER_SITE_OTHER): Payer: Medicare Other | Admitting: Family Medicine

## 2021-05-18 VITALS — BP 140/73 | HR 73 | Temp 98.3°F | Ht 70.0 in | Wt 180.8 lb

## 2021-05-18 DIAGNOSIS — Z8673 Personal history of transient ischemic attack (TIA), and cerebral infarction without residual deficits: Secondary | ICD-10-CM | POA: Diagnosis not present

## 2021-05-18 DIAGNOSIS — J449 Chronic obstructive pulmonary disease, unspecified: Secondary | ICD-10-CM | POA: Diagnosis not present

## 2021-05-18 DIAGNOSIS — F4321 Adjustment disorder with depressed mood: Secondary | ICD-10-CM

## 2021-05-18 DIAGNOSIS — I4891 Unspecified atrial fibrillation: Secondary | ICD-10-CM | POA: Diagnosis not present

## 2021-05-18 DIAGNOSIS — N1832 Chronic kidney disease, stage 3b: Secondary | ICD-10-CM | POA: Diagnosis not present

## 2021-05-18 DIAGNOSIS — Z9889 Other specified postprocedural states: Secondary | ICD-10-CM | POA: Diagnosis not present

## 2021-05-18 DIAGNOSIS — N390 Urinary tract infection, site not specified: Secondary | ICD-10-CM | POA: Diagnosis not present

## 2021-05-18 DIAGNOSIS — Z09 Encounter for follow-up examination after completed treatment for conditions other than malignant neoplasm: Secondary | ICD-10-CM

## 2021-05-18 DIAGNOSIS — Y95 Nosocomial condition: Secondary | ICD-10-CM

## 2021-05-18 DIAGNOSIS — N3000 Acute cystitis without hematuria: Secondary | ICD-10-CM | POA: Diagnosis not present

## 2021-05-18 DIAGNOSIS — A419 Sepsis, unspecified organism: Secondary | ICD-10-CM | POA: Diagnosis not present

## 2021-05-18 DIAGNOSIS — N179 Acute kidney failure, unspecified: Secondary | ICD-10-CM

## 2021-05-18 DIAGNOSIS — J189 Pneumonia, unspecified organism: Secondary | ICD-10-CM

## 2021-05-18 LAB — MICROSCOPIC EXAMINATION
Epithelial Cells (non renal): NONE SEEN /hpf (ref 0–10)
RBC, Urine: NONE SEEN /hpf (ref 0–2)

## 2021-05-18 LAB — URINALYSIS, ROUTINE W REFLEX MICROSCOPIC
Bilirubin, UA: NEGATIVE
Glucose, UA: NEGATIVE
Leukocytes,UA: NEGATIVE
Nitrite, UA: NEGATIVE
Protein,UA: NEGATIVE
RBC, UA: NEGATIVE
Specific Gravity, UA: 1.025 (ref 1.005–1.030)
Urobilinogen, Ur: 0.2 mg/dL (ref 0.2–1.0)
pH, UA: 5.5 (ref 5.0–7.5)

## 2021-05-18 NOTE — Progress Notes (Signed)
Subjective:  Patient ID: Charles Golden, male    DOB: Feb 02, 1926, 87 y.o.   MRN: [id number]  Patient Care Team: [name], DO as PCP - General (Family Medicine) Irine Seal, MD (Urology) Richmond Campbell, MD (Gastroenterology) Clent Jacks, MD (Ophthalmology) Gaynelle Arabian, MD as Consulting Physician (Orthopedic Surgery) Juanito Doom, MD as Consulting Physician (Pulmonary Disease) Kathrynn Ducking, MD as Consulting Physician (Neurology) [name], MD as Consulting Physician (Pulmonary Disease) Dorthy Cooler Radiology, MD as Rounding Team (Interventional Radiology) Katha Cabal, LCSW as [place] (Licensed Clinical Social Worker) Ilean China, RN as [place] Management   Chief Complaint:  Hospitalization Follow-up   HPI: Charles Golden is a 87 y.o. male presenting on 05/18/2021 for Hospitalization Follow-up   Patient presents today for hospital discharge follow-up.  His medical history has become more complex over the last several weeks.  Patient states he attributes this to the grief he is experiencing due to the loss of his wife in November.  States he is having a hard time processing his grief.  He has a decreased appetite and is not as active as normal. He has had several hospital admissions over the last several weeks due to recurrent stroke symptoms.  He underwent a left carotid angioplasty with stent placement on 05/04/2021.  He is healed well from this procedure.  He had an office follow-up after hospital discharge on 05/10/2021.  After office visit patient developed fever, chills, shortness of breath generalized weakness, and vomiting x 1.  He was taken back to the emergency room that evening and was noted to be febrile, tachycardic, and hypotensive.  Creatinine was 2.05, chest x-ray revealed right lower lobe pneumonia, and urinalysis was consistent with urinary tract infection.  He was  given IV fluids, IV vancomycin, IV cefepime, and Tylenol.  He was noted to be in A-Fib with RVR so Eliquis was given as well.  Urinary tract infection was considered to be due to eating out casts at prior hospitalization.  Pneumonia was considered to be hospital-acquired due to prior hospitalizations.  He was admitted for severe sepsis due to UTI and hospital-acquired pneumonia.  He was discharged home on 05/13/2021.  States he has been doing fairly well.  Physical therapy, Occupational Therapy, and home health have been coming from home working with him.  He is using a walker today but feels he will be able to stop using this pretty quickly as he has gained his strength back.  He has not eating or drinking as much as he used to but attributes this to grief.  He denies any fever, chills, chest pain, palpitations, shortness of breath, focal neurological deficits, confusion, syncope, hematuria, or dysuria.   Relevant past medical, surgical, family, and social history reviewed and updated as indicated.  Allergies and medications reviewed and updated. Data reviewed: Chart in Epic.   Past Medical History:  Diagnosis Date   Bronchitis, chronic (HCC)    COPD (chronic obstructive pulmonary disease) (HCC)    Enlarged prostate    GERD (gastroesophageal reflux disease)    Glaucoma    Glucagonoma    Hernia, incisional    at present   [facility] (hard of hearing)    IBS (irritable bowel syndrome)    Sciatic pain    Sinus congestion     Past Surgical History:  Procedure Laterality Date   BOWEL RESECTION N/A 09/04/2012   Procedure: SMALL BOWEL RESECTION;  Surgeon: Ruby Cola  Dondra Prader, MD;  Location: [facility];  Service: General;  Laterality: N/A;  Anastimosis   HEMORRHOID SURGERY     HERNIA REPAIR Bilateral 70's   INGUINAL HERNIA REPAIR Left 09/14/2013   Procedure: LEFT INGUINAL HERNIORRHAPHY;  Surgeon: Jamesetta So, MD;  Location: [facility];  Service: General;  Laterality: Left;   INGUINAL HERNIA REPAIR Right  12/23/2018   Procedure: RECURRENT RIGHT INGUINAL HERNIA  REPAIR  WITH MESH;  Surgeon: Aviva Signs, MD;  Location: [facility];  Service: General;  Laterality: Right;   INSERTION OF MESH Left 09/14/2013   Procedure: INSERTION OF MESH;  Surgeon: Jamesetta So, MD;  Location: [facility];  Service: General;  Laterality: Left;   IR ANGIO INTRA EXTRACRAN SEL INTERNAL CAROTID UNI L MOD SED  05/04/2021   IR INTRAVSC STENT CERV CAROTID W/EMB-PROT MOD SED INCL ANGIO  05/04/2021   IR US GUIDE VASC ACCESS RIGHT  05/04/2021   KNEE ARTHROSCOPY Left 07/21/2014   Procedure: LEFT KNEE ARTHROSCOPY WITH MEDIAL MENISCAL DEBRIDEMENT ;  Surgeon: Gearlean Alf, MD;  Location: [facility];  Service: Orthopedics;  Laterality: Left;   LAPAROTOMY N/A 09/04/2012   Procedure: EXPLORATORY LAPAROTOMY;  Surgeon: Donato Heinz, MD;  Location: [facility];  Service: General;  Laterality: N/A;   RADIOLOGY WITH ANESTHESIA Left 05/04/2021   Procedure: LEFT ICA STENT;  Surgeon: Pedro Earls, MD;  Location: [place] [facility];  Service: Radiology;  Laterality: Left;   SKIN LESION EXCISION     [name]   TONSILLECTOMY      Social History   Socioeconomic History   Marital status: Married    Spouse name: Charles Golden   Number of children: 3   Years of education: 12+   Highest education level: Bachelor's degree (e.g., BA, AB, BS)  Occupational History   Occupation: Retired    Comment: Immunologist  Tobacco Use   Smoking status: Former    Packs/day: 1.00    Years: 30.00    Pack years: 30.00    Types: Cigarettes    Quit date: 05/28/2006    Years since quitting: 14.9   Smokeless tobacco: Never  Vaping Use   Vaping Use: Never used  Substance and Sexual Activity   Alcohol use: Not Currently    Alcohol/week: 0.0 standard drinks   Drug use: No   Sexual activity: Yes    Birth control/protection: None  Other Topics Concern   Not on file  Social History Narrative   Patient lives at home with [name].    Patient has 3 children.     Patient has his BS   Patient is retired.    Drinks about 2 cups of coffee per day.      [facility]:   He is still married. 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 He is from [place].       Social Determinants of Health   Financial Resource Strain: Not on file  Food Insecurity: Not on file  Transportation Needs: Not on file  Physical Activity: Not on file  Stress: Not on file  Social Connections: Not on file  Intimate Partner Violence: Not on file    Outpatient Encounter Medications as of 05/18/2021  Medication Sig   acetaminophen (TYLENOL) 325 MG tablet Take 2 tablets (650 mg total) by mouth every 4 (four) hours as needed for mild pain (or temp > 37.5 C (99.5 F)).   apixaban (ELIQUIS)  2.5 MG TABS tablet Take 1 tablet (2.5 mg total) by mouth 2 (two) times daily.   aspirin EC 325 MG EC tablet Take 1 tablet (325 mg total) by mouth daily.   atorvastatin (LIPITOR) 40 MG tablet Take 1 tablet (40 mg total) by mouth daily.   azelastine (ASTELIN) 0.1 % nasal spray PLACE 1 SPRAY IN EACH NOSTRIL ONCE A DAY AS DIRECTED (Patient taking differently: Place 1 spray into both nostrils at bedtime.)   BREO ELLIPTA 200-25 MCG/INH AEPB Inhale 1 puff into the lungs daily.   Cholecalciferol (VITAMIN D3) 2000 UNITS TABS Take 1 tablet by mouth daily.   Cyanocobalamin (VITAMIN B-12) 5000 MCG SUBL Place 1 tablet under the tongue daily.   finasteride (PROSCAR) 5 MG tablet Take 5 mg by mouth daily.   fluticasone (FLONASE) 50 MCG/ACT nasal spray Place 2 sprays into both nostrils daily.   guaiFENesin (MUCINEX) 600 MG 12 hr tablet Take 600 mg by mouth daily as needed for to loosen phlegm.   latanoprost (XALATAN) 0.005 % ophthalmic solution Place 1 drop into the left eye at bedtime. (Patient taking differently: Place 1 drop into both eyes at bedtime.)   pantoprazole (PROTONIX) 40 MG tablet Take 1 tablet (40 mg total) by mouth daily.    silodosin (RAPAFLO) 4 MG CAPS capsule Take 4 mg by mouth daily with breakfast.   timolol (TIMOPTIC) 0.5 % ophthalmic solution Place 1 drop into the left eye every morning.   VENTOLIN HFA 108 (90 Base) MCG/ACT inhaler INHALE 2 PUFFS EVERY 6 HOURS AS NEEDED FOR WHEEZING OR SHORTNESS OF BREATH (Patient taking differently: 1 puff every 6 (six) hours as needed for shortness of breath.)   [DISCONTINUED] azithromycin (ZITHROMAX) 500 MG tablet Take 1 tablet (500 mg total) by mouth daily for 5 days. Take 1 tablet daily for 3 days.   [DISCONTINUED] cefpodoxime (VANTIN) 200 MG tablet Take 1 tablet (200 mg total) by mouth 2 (two) times daily for 5 days.   No facility-administered encounter medications on file as of 05/18/2021.    Allergies  Allergen Reactions   Demerol [Meperidine] Other (See Comments)    Pt. States "he woke up during a colonoscopy"   Spiriva Handihaler [Tiotropium Bromide Monohydrate] Other (See Comments)    Mild Urinary Retention    Review of Systems  Constitutional:  Positive for activity change, appetite change and fatigue. Negative for chills, diaphoresis, fever and unexpected weight change.  HENT:  Negative for congestion.   Respiratory:  Positive for cough (intermittent, baseline). Negative for shortness of breath.   Cardiovascular:  Negative for chest pain, palpitations and leg swelling.  Gastrointestinal:  Negative for abdominal pain, anal bleeding and blood in stool.  Genitourinary:  Positive for decreased urine volume. Negative for difficulty urinating, dysuria, enuresis, flank pain, frequency, hematuria, penile discharge, penile pain, penile swelling, scrotal swelling, testicular pain and urgency.  Skin:  Positive for wound. Negative for color change, pallor and rash.  Neurological:  Positive for weakness (generalized). Negative for dizziness, tremors, seizures, syncope, facial asymmetry, speech difficulty, light-headedness, numbness and headaches.   Psychiatric/Behavioral:  Negative for agitation, behavioral problems, confusion, decreased concentration, dysphoric mood, hallucinations, self-injury, sleep disturbance and suicidal ideas. The patient is not nervous/anxious and is not hyperactive.   All other systems reviewed and are negative.      Objective:  BP 140/73    Pulse 73    Temp 98.3 F (36.8 C)    Ht 5' 10"  (1.778 m)    Wt 180 lb 12.8  oz (82 kg)    SpO2 98%    BMI 25.94 kg/m    Wt Readings from Last 3 Encounters:  05/18/21 180 lb 12.8 oz (82 kg)  05/12/21 180 lb 12.4 oz (82 kg)  05/10/21 169 lb 9.6 oz (76.9 kg)    Physical Exam Vitals and nursing note reviewed.  Constitutional:      General: He is not in acute distress.    Appearance: Normal appearance. He is not ill-appearing, toxic-appearing or diaphoretic.  HENT:     Head: Normocephalic and atraumatic.     Right Ear: Decreased hearing noted.     Left Ear: Decreased hearing noted.     Nose: Nose normal.     Mouth/Throat:     Mouth: Mucous membranes are moist.  Eyes:     Conjunctiva/sclera: Conjunctivae normal.     Pupils: Pupils are equal, round, and reactive to light.  Cardiovascular:     Rate and Rhythm: Normal rate and regular rhythm.     Pulses: Normal pulses.     Heart sounds: Normal heart sounds. No murmur heard.   No friction rub. No gallop.     Comments: Dressing from right groin removed, no pseudoaneurysm or bleeding noted. Minimal ecchymosis. Well healed access site. Pulmonary:     Effort: Pulmonary effort is normal.     Breath sounds: Normal breath sounds.  Abdominal:     General: Bowel sounds are normal.     Palpations: Abdomen is soft.  Musculoskeletal:     Cervical back: Normal range of motion and neck supple.     Right lower leg: No edema.     Left lower leg: No edema.  Skin:    General: Skin is warm and dry.     Capillary Refill: Capillary refill takes less than 2 seconds.  Neurological:     General: No focal deficit present.      Mental Status: He is alert and oriented to person, place, and time.  Psychiatric:        Attention and Perception: Attention and perception normal.        Mood and Affect: Mood is depressed. Affect is tearful.        Speech: Speech normal.        Behavior: Behavior normal. Behavior is cooperative.        Thought Content: Thought content normal.        Cognition and Memory: Cognition and memory normal.        Judgment: Judgment normal.    Results for orders placed or performed during the hospital encounter of 05/10/21  Resp Panel by RT-PCR (Flu A&B, Covid) Nasopharyngeal Swab   Specimen: Nasopharyngeal Swab; Nasopharyngeal(NP) swabs in vial transport medium  Result Value Ref Range   SARS Coronavirus 2 by RT PCR NEGATIVE NEGATIVE   Influenza A by PCR NEGATIVE NEGATIVE   Influenza B by PCR NEGATIVE NEGATIVE  Blood culture (routine x 2)   Specimen: BLOOD  Result Value Ref Range   Specimen Description BLOOD RIGHT ANTECUBITAL    Special Requests      BOTTLES DRAWN AEROBIC AND ANAEROBIC Blood Culture adequate volume   Culture      NO GROWTH 5 DAYS Performed at [facility], [place] [address] [place], [place] [address]    Report Status 05/15/2021 FINAL   Blood culture (routine x 2)   Specimen: BLOOD LEFT FOREARM  Result Value Ref Range   Specimen Description BLOOD LEFT FOREARM    Special Requests  BOTTLES DRAWN AEROBIC AND ANAEROBIC Blood Culture adequate volume   Culture      NO GROWTH 5 DAYS Performed at [facility], [place] [address] [place], [place] [address]    Report Status 05/15/2021 FINAL   CBC with Differential/Platelet  Result Value Ref Range   WBC 15.7 (H) 4.0 - 10.5 K/uL   RBC 4.31 4.22 - 5.81 MIL/uL   Hemoglobin 13.2 13.0 - 17.0 g/dL   HCT 41.1 39.0 - 52.0 %   MCV 95.4 80.0 - 100.0 fL   MCH 30.6 26.0 - 34.0 pg   MCHC 32.1 30.0 - 36.0 g/dL   RDW 13.2 11.5 - 15.5 %   Platelets 331 150 - 400 K/uL   nRBC 0.0 0.0 - 0.2 %   Neutrophils Relative % 89 %    Neutro Abs 13.9 (H) 1.7 - 7.7 K/uL   Lymphocytes Relative 4 %   Lymphs Abs 0.7 0.7 - 4.0 K/uL   Monocytes Relative 6 %   Monocytes Absolute 1.0 0.1 - 1.0 K/uL   Eosinophils Relative 0 %   Eosinophils Absolute 0.1 0.0 - 0.5 K/uL   Basophils Relative 0 %   Basophils Absolute 0.0 0.0 - 0.1 K/uL   Immature Granulocytes 1 %   Abs Immature Granulocytes 0.09 (H) 0.00 - 0.07 K/uL  Comprehensive metabolic panel  Result Value Ref Range   Sodium 137 135 - 145 mmol/L   Potassium 4.4 3.5 - 5.1 mmol/L   Chloride 107 98 - 111 mmol/L   CO2 18 (L) 22 - 32 mmol/L   Glucose, Bld 113 (H) 70 - 99 mg/dL   BUN 21 8 - 23 mg/dL   Creatinine, Ser 2.05 (H) 0.61 - 1.24 mg/dL   Calcium 9.1 8.9 - 10.3 mg/dL   Total Protein 6.4 (L) 6.5 - 8.1 g/dL   Albumin 3.4 (L) 3.5 - 5.0 g/dL   AST 22 15 - 41 U/L   ALT 18 0 - 44 U/L   Alkaline Phosphatase 86 38 - 126 U/L   Total Bilirubin 0.8 0.3 - 1.2 mg/dL   GFR, Estimated 30 (L) >60 mL/min   Anion gap 12 5 - 15  Brain natriuretic peptide  Result Value Ref Range   B Natriuretic Peptide 105.7 (H) 0.0 - 100.0 pg/mL  Urinalysis, Routine w reflex microscopic Urine, Clean Catch  Result Value Ref Range   Color, Urine YELLOW YELLOW   APPearance CLEAR CLEAR   Specific Gravity, Urine 1.015 1.005 - 1.030   pH 5.0 5.0 - 8.0   Glucose, UA NEGATIVE NEGATIVE mg/dL   Hgb urine dipstick NEGATIVE NEGATIVE   Bilirubin Urine NEGATIVE NEGATIVE   Ketones, ur NEGATIVE NEGATIVE mg/dL   Protein, ur NEGATIVE NEGATIVE mg/dL   Nitrite NEGATIVE NEGATIVE   Leukocytes,Ua NEGATIVE NEGATIVE  Lactic acid, plasma  Result Value Ref Range   Lactic Acid, Venous 1.9 0.5 - 1.9 mmol/L  Lactic acid, plasma  Result Value Ref Range   Lactic Acid, Venous 1.3 0.5 - 1.9 mmol/L  Procalcitonin - Baseline  Result Value Ref Range   Procalcitonin 4.56 ng/mL  Procalcitonin  Result Value Ref Range   Procalcitonin 7.40 ng/mL  Basic metabolic panel  Result Value Ref Range   Sodium 136 135 - 145 mmol/L    Potassium 4.5 3.5 - 5.1 mmol/L   Chloride 111 98 - 111 mmol/L   CO2 19 (L) 22 - 32 mmol/L   Glucose, Bld 110 (H) 70 - 99 mg/dL   BUN 20  8 - 23 mg/dL   Creatinine, Ser 1.69 (H) 0.61 - 1.24 mg/dL   Calcium 7.8 (L) 8.9 - 10.3 mg/dL   GFR, Estimated 37 (L) >60 mL/min   Anion gap 6 5 - 15  CBC  Result Value Ref Range   WBC 17.7 (H) 4.0 - 10.5 K/uL   RBC 3.33 (L) 4.22 - 5.81 MIL/uL   Hemoglobin 10.5 (L) 13.0 - 17.0 g/dL   HCT 33.9 (L) 39.0 - 52.0 %   MCV 101.8 (H) 80.0 - 100.0 fL   MCH 31.5 26.0 - 34.0 pg   MCHC 31.0 30.0 - 36.0 g/dL   RDW 13.4 11.5 - 15.5 %   Platelets 250 150 - 400 K/uL   nRBC 0.0 0.0 - 0.2 %  Sodium, urine, random  Result Value Ref Range   Sodium, Ur 162 mmol/L  Creatinine, urine, random  Result Value Ref Range   Creatinine, Urine 38.74 mg/dL  Procalcitonin  Result Value Ref Range   Procalcitonin 6.25 ng/mL  Basic metabolic panel  Result Value Ref Range   Sodium 137 135 - 145 mmol/L   Potassium 4.2 3.5 - 5.1 mmol/L   Chloride 111 98 - 111 mmol/L   CO2 20 (L) 22 - 32 mmol/L   Glucose, Bld 98 70 - 99 mg/dL   BUN 17 8 - 23 mg/dL   Creatinine, Ser 1.55 (H) 0.61 - 1.24 mg/dL   Calcium 8.1 (L) 8.9 - 10.3 mg/dL   GFR, Estimated 41 (L) >60 mL/min   Anion gap 6 5 - 15  CBC  Result Value Ref Range   WBC 9.0 4.0 - 10.5 K/uL   RBC 3.17 (L) 4.22 - 5.81 MIL/uL   Hemoglobin 9.9 (L) 13.0 - 17.0 g/dL   HCT 30.1 (L) 39.0 - 52.0 %   MCV 95.0 80.0 - 100.0 fL   MCH 31.2 26.0 - 34.0 pg   MCHC 32.9 30.0 - 36.0 g/dL   RDW 13.4 11.5 - 15.5 %   Platelets 244 150 - 400 K/uL   nRBC 0.0 0.0 - 0.2 %  Basic metabolic panel  Result Value Ref Range   Sodium 137 135 - 145 mmol/L   Potassium 3.9 3.5 - 5.1 mmol/L   Chloride 108 98 - 111 mmol/L   CO2 24 22 - 32 mmol/L   Glucose, Bld 98 70 - 99 mg/dL   BUN 12 8 - 23 mg/dL   Creatinine, Ser 1.38 (H) 0.61 - 1.24 mg/dL   Calcium 8.2 (L) 8.9 - 10.3 mg/dL   GFR, Estimated 48 (L) >60 mL/min   Anion gap 5 5 - 15  CBC  Result  Value Ref Range   WBC 8.9 4.0 - 10.5 K/uL   RBC 3.36 (L) 4.22 - 5.81 MIL/uL   Hemoglobin 10.2 (L) 13.0 - 17.0 g/dL   HCT 31.8 (L) 39.0 - 52.0 %   MCV 94.6 80.0 - 100.0 fL   MCH 30.4 26.0 - 34.0 pg   MCHC 32.1 30.0 - 36.0 g/dL   RDW 13.2 11.5 - 15.5 %   Platelets 261 150 - 400 K/uL   nRBC 0.0 0.0 - 0.2 %  I-stat chem 8, ED (not at [facility] or [facility])  Result Value Ref Range   Sodium 136 135 - 145 mmol/L   Potassium 4.3 3.5 - 5.1 mmol/L   Chloride 107 98 - 111 mmol/L   BUN 23 8 - 23 mg/dL   Creatinine, Ser 1.90 (H) 0.61 - 1.24 mg/dL  Glucose, Bld 113 (H) 70 - 99 mg/dL   Calcium, Ion 1.03 (L) 1.15 - 1.40 mmol/L   TCO2 20 (L) 22 - 32 mmol/L   Hemoglobin 13.9 13.0 - 17.0 g/dL   HCT 41.0 39.0 - 52.0 %  Troponin I (High Sensitivity)  Result Value Ref Range   Troponin I (High Sensitivity) 47 (H) <18 ng/L  Troponin I (High Sensitivity)  Result Value Ref Range   Troponin I (High Sensitivity) 44 (H) <18 ng/L       Pertinent labs & imaging results that were available during my care of the patient were reviewed by me and considered in my medical decision making.  Assessment & Plan:  Lor[facility] was seen today for hospitalization follow-up.  Diagnoses and all orders for this visit:  Hospital discharge follow-up Urinalysis unremarkable in office.  We will recheck CBC and BMP today.  Patient has telephone visit with chronic care management tomorrow.  He reports he is doing pretty well since being home but still suffering from grief due to the loss of his wife. -     Urinalysis, Routine w reflex microscopic -     CBC with Differential/Platelet -     BMP8+EGFR  Sepsis secondary to UTI [facility]) Doing much better since discharge.  Urinalysis unremarkable today.  CBC and BMP pending. -     Urinalysis, Routine w reflex microscopic -     CBC with Differential/Platelet -     BMP8+EGFR  Hospital-acquired pneumonia Has completed oral antibiotics as prescribed.  Doing well. -     CBC with  Differential/Platelet -     BMP8+EGFR  AKI (acute kidney injury) ([place]) Voiding normally at home.  Will recheck labs today. -     CBC with Differential/Platelet -     BMP8+EGFR  Atrial fibrillation with RVR ([place]) Denies symptomology.  Rate controlled today.  On Eliquis. -     CBC with Differential/Platelet -     BMP8+EGFR  Recent cerebrovascular accident (CVA) S/P carotid endarterectomy Doing well.  Has follow-up with neuro services soon. -     CBC with Differential/Platelet -     BMP8+EGFR  COPD mixed type (HCC) No new or worsening symptoms, has follow-up with pulmonology scheduled. -     CBC with Differential/Platelet -     BMP8+EGFR  Grief reaction Discussed grieving process in detail today.  Has follow-up with chronic care management tomorrow, can discuss possible counseling resources within the community.    Continue all other maintenance medications.  Follow up plan: Return in about 1 month (around 06/18/2021) for PCP as scheduled.   Continue healthy lifestyle choices, including diet (rich in fruits, vegetables, and lean proteins, and low in salt and simple carbohydrates) and exercise (at least 30 minutes of moderate physical activity daily).   The above assessment and management plan was discussed with the patient. The patient verbalized understanding of and has agreed to the management plan. Patient is aware to call the clinic if they develop any new symptoms or if symptoms persist or worsen. Patient is aware when to return to the clinic for a follow-up visit. Patient educated on when it is appropriate to go to the emergency department.   [name], FNP-C [facility] [phone]

## 2021-05-18 NOTE — Telephone Encounter (Signed)
Michelle completed & gave copy at visit today

## 2021-05-19 ENCOUNTER — Ambulatory Visit (INDEPENDENT_AMBULATORY_CARE_PROVIDER_SITE_OTHER): Payer: Medicare Other | Admitting: Licensed Clinical Social Worker

## 2021-05-19 DIAGNOSIS — Z8673 Personal history of transient ischemic attack (TIA), and cerebral infarction without residual deficits: Secondary | ICD-10-CM

## 2021-05-19 DIAGNOSIS — J449 Chronic obstructive pulmonary disease, unspecified: Secondary | ICD-10-CM

## 2021-05-19 DIAGNOSIS — I4891 Unspecified atrial fibrillation: Secondary | ICD-10-CM

## 2021-05-19 DIAGNOSIS — R351 Nocturia: Secondary | ICD-10-CM

## 2021-05-19 DIAGNOSIS — N1831 Chronic kidney disease, stage 3a: Secondary | ICD-10-CM

## 2021-05-19 DIAGNOSIS — K582 Mixed irritable bowel syndrome: Secondary | ICD-10-CM

## 2021-05-19 DIAGNOSIS — G459 Transient cerebral ischemic attack, unspecified: Secondary | ICD-10-CM

## 2021-05-19 DIAGNOSIS — K219 Gastro-esophageal reflux disease without esophagitis: Secondary | ICD-10-CM

## 2021-05-19 LAB — CBC WITH DIFFERENTIAL/PLATELET
Basophils Absolute: 0 10*3/uL (ref 0.0–0.2)
Basos: 0 %
EOS (ABSOLUTE): 0.2 10*3/uL (ref 0.0–0.4)
Eos: 2 %
Hematocrit: 37.1 % — ABNORMAL LOW (ref 37.5–51.0)
Hemoglobin: 11.9 g/dL — ABNORMAL LOW (ref 13.0–17.7)
Immature Grans (Abs): 0.1 10*3/uL (ref 0.0–0.1)
Immature Granulocytes: 1 %
Lymphocytes Absolute: 1.5 10*3/uL (ref 0.7–3.1)
Lymphs: 15 %
MCH: 30.4 pg (ref 26.6–33.0)
MCHC: 32.1 g/dL (ref 31.5–35.7)
MCV: 95 fL (ref 79–97)
Monocytes Absolute: 0.6 10*3/uL (ref 0.1–0.9)
Monocytes: 6 %
Neutrophils Absolute: 7.7 10*3/uL — ABNORMAL HIGH (ref 1.4–7.0)
Neutrophils: 76 %
Platelets: 309 10*3/uL (ref 150–450)
RBC: 3.92 x10E6/uL — ABNORMAL LOW (ref 4.14–5.80)
RDW: 12.3 % (ref 11.6–15.4)
WBC: 10.1 10*3/uL (ref 3.4–10.8)

## 2021-05-19 LAB — BMP8+EGFR
BUN/Creatinine Ratio: 14 (ref 10–24)
BUN: 22 mg/dL (ref 10–36)
CO2: 23 mmol/L (ref 20–29)
Calcium: 8.9 mg/dL (ref 8.6–10.2)
Chloride: 109 mmol/L — ABNORMAL HIGH (ref 96–106)
Creatinine, Ser: 1.56 mg/dL — ABNORMAL HIGH (ref 0.76–1.27)
Glucose: 94 mg/dL (ref 70–99)
Potassium: 4.7 mmol/L (ref 3.5–5.2)
Sodium: 144 mmol/L (ref 134–144)
eGFR: 41 mL/min/{1.73_m2} — ABNORMAL LOW (ref >59)

## 2021-05-19 NOTE — Chronic Care Management (AMB) (Signed)
Chronic Care Management    Clinical Social Work Note  05/19/2021 Name: Charles Golden MRN: [id number] DOB: [birth year]  Charles Golden is a 87 y.o. year old male who is a primary care patient of [name], DO. The CCM team was consulted to assist the patient with chronic disease management and/or care coordination needs related to: [employer] .   Engaged with patient / daughter of patient, Charles Golden, by telephone for initial visit in response to provider referral for social work chronic care management and care coordination services.   Consent to Services:  The patient was given the following information about Chronic Care Management services today, agreed to services, and gave verbal consent: 1. CCM service includes personalized support from designated clinical staff supervised by the primary care provider, including individualized plan of care and coordination with other care providers 2. 24/7 contact phone numbers for assistance for urgent and routine care needs. 3. Service will only be billed when office clinical staff spend 20 minutes or more in a month to coordinate care. 4. Only one practitioner may furnish and bill the service in a calendar month. 5.The patient may stop CCM services at any time (effective at the end of the month) by phone call to the office staff. 6. The patient will be responsible for cost sharing (co-pay) of up to 20% of the service fee (after annual deductible is met). Patient agreed to services and consent obtained.  Patient agreed to services and consent obtained.   Assessment: Review of patient past medical history, allergies, medications, and health status, including review of relevant consultants reports was performed today as part of a comprehensive evaluation and provision of chronic care management and care coordination services.     SDOH (Social Determinants of Health) assessments and interventions performed:  SDOH Interventions     Flowsheet Row Most Recent Value  SDOH Interventions   Physical Activity Interventions Other (Comments)  [walking challenges. uses a walker to help him walk]  Stress Interventions Provide Counseling  Burnett Sheng has stress related to death of his wife in 05/12/2021. client has stress related to managing medical needs]  Depression Interventions/Treatment  Counseling        Advanced Directives Status: See Vynca application for related entries.  CCM Care Plan  Allergies  Allergen Reactions   Demerol [Meperidine] Other (See Comments)    Pt. States "he woke up during a colonoscopy"   Spiriva Handihaler [Tiotropium Bromide Monohydrate] Other (See Comments)    Mild Urinary Retention    Outpatient Encounter Medications as of 05/19/2021  Medication Sig Note   acetaminophen (TYLENOL) 325 MG tablet Take 2 tablets (650 mg total) by mouth every 4 (four) hours as needed for mild pain (or temp > 37.5 C (99.5 F)).    apixaban (ELIQUIS) 2.5 MG TABS tablet Take 1 tablet (2.5 mg total) by mouth 2 (two) times daily.    aspirin EC 325 MG EC tablet Take 1 tablet (325 mg total) by mouth daily. 04/29/2021: From Hospital   atorvastatin (LIPITOR) 40 MG tablet Take 1 tablet (40 mg total) by mouth daily. 04/29/2021: From Hospital   azelastine (ASTELIN) 0.1 % nasal spray PLACE 1 SPRAY IN EACH NOSTRIL ONCE A DAY AS DIRECTED (Patient taking differently: Place 1 spray into both nostrils at bedtime.)    BREO ELLIPTA 200-25 MCG/INH AEPB Inhale 1 puff into the lungs daily.    Cholecalciferol (VITAMIN D3) 2000 UNITS TABS Take 1 tablet by mouth daily.  Cyanocobalamin (VITAMIN B-12) 5000 MCG SUBL Place 1 tablet under the tongue daily.    finasteride (PROSCAR) 5 MG tablet Take 5 mg by mouth daily.    fluticasone (FLONASE) 50 MCG/ACT nasal spray Place 2 sprays into both nostrils daily.    guaiFENesin (MUCINEX) 600 MG 12 hr tablet Take 600 mg by mouth daily as needed for to loosen phlegm.    latanoprost (XALATAN)  0.005 % ophthalmic solution Place 1 drop into the left eye at bedtime. (Patient taking differently: Place 1 drop into both eyes at bedtime.)    pantoprazole (PROTONIX) 40 MG tablet Take 1 tablet (40 mg total) by mouth daily.    silodosin (RAPAFLO) 4 MG CAPS capsule Take 4 mg by mouth daily with breakfast.    timolol (TIMOPTIC) 0.5 % ophthalmic solution Place 1 drop into the left eye every morning.    VENTOLIN HFA 108 (90 Base) MCG/ACT inhaler INHALE 2 PUFFS EVERY 6 HOURS AS NEEDED FOR WHEEZING OR SHORTNESS OF BREATH (Patient taking differently: 1 puff every 6 (six) hours as needed for shortness of breath.)    No facility-administered encounter medications on file as of 05/19/2021.    Patient Active Problem List   Diagnosis Date Noted   Sepsis secondary to UTI ([place]) 05/10/2021   Acute renal failure superimposed on stage 3b chronic kidney disease ([place]) 05/10/2021   Atrial fibrillation with RVR ([place]) 05/10/2021   Skin infection 05/08/2021   Protein-calorie malnutrition, severe 05/05/2021   CVA (cerebral vascular accident) ([place]) 05/01/2021   Stenosis of left carotid artery    Acute ischemic stroke ([place]) 04/29/2021   TIA (transient ischemic attack) 04/27/2021   Chronic tension-type headache, not intractable 12/13/2020   Bilateral sensorineural hearing loss 09/25/2019   Bronchiectasis without complication ([place]) 14/97/0263   Fatigue 05/27/2019   Vitamin D deficiency 01/21/2019   Weakness 01/21/2019   Recurrent right inguinal hernia    Presbycusis of both ears 07/19/2017   Restless leg syndrome 11/25/2016   Collagenous colitis 07/24/2016   Deviated nasal septum 09/03/2015   Basal cell carcinoma of nose 09/03/2015   Thoracic aortic atherosclerosis ([place]) 05/03/2015   Upper airway cough syndrome 04/28/2015   GERD without esophagitis 04/11/2015   Acute medial meniscal tear 07/20/2014   COPD GOLD I  12/08/2013   Small bowel volvulus ([place]) 08/05/2013   Chronic headache 08/05/2013   Left  inguinal hernia 08/05/2013   Incisional hernia, without obstruction or gangrene 08/05/2013   Headache 05/01/2013   Obstructive chronic bronchitis without exacerbation ([place]) 04/30/2013   Left hip pain 04/29/2013   BPH (benign prostatic hyperplasia) 01/05/2013   IBS (irritable bowel syndrome) 09/03/2012   Chronic rhinosinusitis 09/03/2012   Lung nodules 05/04/2003    Conditions to be addressed/monitored: Monitor client management of grief issues. Monitor client management of depression issues  Care Plan : LCSW Care Plan  Updates made by Katha Cabal, LCSW since 05/19/2021 12:00 AM     Problem: Emotional Distress      Goal: Emotional Health Supported. Manage Grief issues. Manage Depression issues   Start Date: 05/19/2021  Expected End Date: 08/14/2021  This Visit's Progress: Not on track  Priority: Medium  Note:   Current Barriers:  Grief issues faced Depression issues faced Suicidal Ideation/Homicidal Ideation: No  Clinical Social Work Goal(s):  patient will work with SW monthly by telephone or in person to reduce or manage symptoms related to grief issues faced Patient will work with SW monthly by telephone or in person to manage depression issues  faced Patient will communicate as needed in next 30 days with [facility] for CCM nursing support Patient will attend scheduled medical appointments in next 30 days  Interventions: Appropriate assessments performed: GAD-7; PHQ-9 1:1 collaboration with [name], DO regarding development and update of comprehensive plan of care as evidenced by provider attestation and co-signature Discussed client needs with Charles Golden, daughter of client Discussed appetite of client. Chrys Racer said client is eating small meals each day. She said he also drinks one can of Boost supplement daily Reviewed walking of client. Chrys Racer said client is using a walker to help him walk. He has been receiving physical [facility] support in the home  with [facility]  Reviewed hearing of client. Chrys Racer said client is hard of hearing. She said he has a hearing aid to use as needed Provided counseling support to [place] related to needs of Hassan Buckler Reviewed client hospitalization recently. Chrys Racer said that spouse of client died in 04/27/21. Also client had a CVA in December of 2022.  Client received hospital care following his CVA.  He was discharged home following hospital care and his daughters are taking turns caring for him in the home Related to grief support, LCSW talked with Chrys Racer about Grief Share program through [facility], [place]. Grief Share program could be support that would be helpful to client. Chrys Racer said that Grief Share Program actually meets regularly in church where client attends. LCSW encouraged client to attend Grief Share program for support, when is able to attend Discussed relaxation techniques of client (client enjoys watching history channel on TV, enjoys reading) Discussed family support for client. Client has one daughter who resides in [place], [place]; he has a daughter who resides in [place], [place]; he has a daughter who resides in [place], [place] Discussed in home support for client. Charles Golden said client has already had a in home nurse visit with nurse from [facility]. She is hoping that in January of 2023 client will be able to start receiving in home care support through [facility]. She also said she plans to call [employer] [facility] of [place], [place] to see if she can add client to the Meals on Wheels list for [facility], [place] Encouraged client or daughter of client to call RNCM as needed for CCM nursing support for client Provided Charles Golden with LCSW phone number to call LCSW as needed for support for client  Patient Self Care Activities:  Attends scheduled medical appointments Participates in [place] as scheduled  Patient Coping Strengths:  Has strong family support Has  support from friends at church Enjoys reading about history  Patient Self Care Deficits:  Depression issues Grief issues  Patient Goals:  - spend time or talk with others at least 2 to 3 times per week - practice relaxation or meditation daily - keep a calendar with appointment dates  Follow Up Plan: LCSW to call client or daughter of client on 07/17/21 at 10:00 AM to assess client needs     Norva Riffle.Terressa Evola MSW, LCSW Licensed Clinical Social Worker [facility] Care Management [phone]

## 2021-05-19 NOTE — Patient Instructions (Addendum)
Visit Information  Patient Goals:  Depressive Symptoms Identified. Manage Depression issues. Manage Grief issues  Time Frame:  Short Term Goal Priority:  Medium Progress:  Not On Track  Start Date:  05/19/21 Expected End Date:  08/14/21  Follow Up Date;  07/17/21 at 10:00 AM  Depressive Symptoms Identified.  Manage Depression issues. Manage Grief issues  Patient Self Care Activities:  Attends scheduled medical appointments Participates in [place] as scheduled  Patient Coping Strengths:  Has strong family support Has support from friends at church Enjoys reading about history  Patient Self Care Deficits:  Depression issues Grief issues  Patient Goals:  - spend time or talk with others at least 2 to 3 times per week - practice relaxation or meditation daily - keep a calendar with appointment dates  Follow Up Plan: LCSW to call client or daughter of client on 07/17/21 at 10:00 AM to assess client needs   Charles Golden MSW, LCSW Licensed Clinical Social Worker [facility] Care Management [phone]

## 2021-05-23 DIAGNOSIS — N390 Urinary tract infection, site not specified: Secondary | ICD-10-CM | POA: Diagnosis not present

## 2021-05-23 DIAGNOSIS — J181 Lobar pneumonia, unspecified organism: Secondary | ICD-10-CM | POA: Diagnosis not present

## 2021-05-23 DIAGNOSIS — J440 Chronic obstructive pulmonary disease with acute lower respiratory infection: Secondary | ICD-10-CM | POA: Diagnosis not present

## 2021-05-23 DIAGNOSIS — I129 Hypertensive chronic kidney disease with stage 1 through stage 4 chronic kidney disease, or unspecified chronic kidney disease: Secondary | ICD-10-CM | POA: Diagnosis not present

## 2021-05-23 DIAGNOSIS — Z48812 Encounter for surgical aftercare following surgery on the circulatory system: Secondary | ICD-10-CM | POA: Diagnosis not present

## 2021-05-23 DIAGNOSIS — A419 Sepsis, unspecified organism: Secondary | ICD-10-CM | POA: Diagnosis not present

## 2021-05-25 DIAGNOSIS — N401 Enlarged prostate with lower urinary tract symptoms: Secondary | ICD-10-CM | POA: Diagnosis not present

## 2021-05-25 DIAGNOSIS — R3914 Feeling of incomplete bladder emptying: Secondary | ICD-10-CM | POA: Diagnosis not present

## 2021-05-30 LAB — AFB CULTURE WITH SMEAR (NOT AT ARMC)
Acid Fast Culture: NEGATIVE
Acid Fast Smear: NEGATIVE

## 2021-05-31 DIAGNOSIS — I129 Hypertensive chronic kidney disease with stage 1 through stage 4 chronic kidney disease, or unspecified chronic kidney disease: Secondary | ICD-10-CM | POA: Diagnosis not present

## 2021-05-31 DIAGNOSIS — J440 Chronic obstructive pulmonary disease with acute lower respiratory infection: Secondary | ICD-10-CM | POA: Diagnosis not present

## 2021-05-31 DIAGNOSIS — J181 Lobar pneumonia, unspecified organism: Secondary | ICD-10-CM | POA: Diagnosis not present

## 2021-05-31 DIAGNOSIS — N390 Urinary tract infection, site not specified: Secondary | ICD-10-CM | POA: Diagnosis not present

## 2021-05-31 DIAGNOSIS — Z48812 Encounter for surgical aftercare following surgery on the circulatory system: Secondary | ICD-10-CM | POA: Diagnosis not present

## 2021-05-31 DIAGNOSIS — A419 Sepsis, unspecified organism: Secondary | ICD-10-CM | POA: Diagnosis not present

## 2021-06-07 ENCOUNTER — Ambulatory Visit (INDEPENDENT_AMBULATORY_CARE_PROVIDER_SITE_OTHER): Payer: Medicare Other

## 2021-06-07 ENCOUNTER — Other Ambulatory Visit: Payer: Self-pay

## 2021-06-07 DIAGNOSIS — N179 Acute kidney failure, unspecified: Secondary | ICD-10-CM | POA: Diagnosis not present

## 2021-06-07 DIAGNOSIS — Z7901 Long term (current) use of anticoagulants: Secondary | ICD-10-CM

## 2021-06-07 DIAGNOSIS — A419 Sepsis, unspecified organism: Secondary | ICD-10-CM | POA: Diagnosis not present

## 2021-06-07 DIAGNOSIS — Z48812 Encounter for surgical aftercare following surgery on the circulatory system: Secondary | ICD-10-CM

## 2021-06-07 DIAGNOSIS — I082 Rheumatic disorders of both aortic and tricuspid valves: Secondary | ICD-10-CM

## 2021-06-07 DIAGNOSIS — M47812 Spondylosis without myelopathy or radiculopathy, cervical region: Secondary | ICD-10-CM | POA: Diagnosis not present

## 2021-06-07 DIAGNOSIS — I480 Paroxysmal atrial fibrillation: Secondary | ICD-10-CM

## 2021-06-07 DIAGNOSIS — I129 Hypertensive chronic kidney disease with stage 1 through stage 4 chronic kidney disease, or unspecified chronic kidney disease: Secondary | ICD-10-CM | POA: Diagnosis not present

## 2021-06-07 DIAGNOSIS — N390 Urinary tract infection, site not specified: Secondary | ICD-10-CM | POA: Diagnosis not present

## 2021-06-07 DIAGNOSIS — N1832 Chronic kidney disease, stage 3b: Secondary | ICD-10-CM

## 2021-06-07 DIAGNOSIS — N400 Enlarged prostate without lower urinary tract symptoms: Secondary | ICD-10-CM

## 2021-06-07 DIAGNOSIS — K219 Gastro-esophageal reflux disease without esophagitis: Secondary | ICD-10-CM

## 2021-06-07 DIAGNOSIS — J181 Lobar pneumonia, unspecified organism: Secondary | ICD-10-CM

## 2021-06-07 DIAGNOSIS — J440 Chronic obstructive pulmonary disease with acute lower respiratory infection: Secondary | ICD-10-CM | POA: Diagnosis not present

## 2021-06-07 DIAGNOSIS — I959 Hypotension, unspecified: Secondary | ICD-10-CM

## 2021-06-07 DIAGNOSIS — Z7982 Long term (current) use of aspirin: Secondary | ICD-10-CM

## 2021-06-07 DIAGNOSIS — R000 Tachycardia, unspecified: Secondary | ICD-10-CM

## 2021-06-09 DIAGNOSIS — Z029 Encounter for administrative examinations, unspecified: Secondary | ICD-10-CM

## 2021-06-16 ENCOUNTER — Ambulatory Visit (INDEPENDENT_AMBULATORY_CARE_PROVIDER_SITE_OTHER): Payer: Medicare Other | Admitting: Family Medicine

## 2021-06-16 ENCOUNTER — Encounter: Payer: Self-pay | Admitting: Family Medicine

## 2021-06-16 VITALS — BP 119/72 | HR 63 | Temp 97.6°F | Ht 70.0 in | Wt 169.0 lb

## 2021-06-16 DIAGNOSIS — Z8673 Personal history of transient ischemic attack (TIA), and cerebral infarction without residual deficits: Secondary | ICD-10-CM | POA: Diagnosis not present

## 2021-06-16 DIAGNOSIS — Z7901 Long term (current) use of anticoagulants: Secondary | ICD-10-CM

## 2021-06-16 DIAGNOSIS — I998 Other disorder of circulatory system: Secondary | ICD-10-CM

## 2021-06-16 MED ORDER — APIXABAN 2.5 MG PO TABS: 2.5000 mg | ORAL_TABLET | Freq: Two times a day (BID) | ORAL | 1 refills | Status: DC

## 2021-06-16 NOTE — Progress Notes (Signed)
Subjective: CC: Follow-up A. fib, blood pressure PCP: [name], DO [id number] Charles Golden is a 88 y.o. male, is accompanied today by his daughter.  He is presenting to clinic today for:  1.  Fluctuating blood pressure, atrial fibrillation Patient's blood pressures have been ranging anywhere from normotensive to a couple of outliers where his systolics have gone into the 638V over diastolics into the 56E.  No reports of chest pain, shortness of breath, dizziness or visual disturbance.  He has been compliant with his Eliquis and denies any bleeding with that.  Overall most blood pressures remain below 150/90.   ROS: Per HPI  Allergies  Allergen Reactions   Demerol [Meperidine] Other (See Comments)    Pt. States "he woke up during a colonoscopy"   Spiriva Handihaler [Tiotropium Bromide Monohydrate] Other (See Comments)    Mild Urinary Retention   Past Medical History:  Diagnosis Date   Bronchitis, chronic (HCC)    COPD (chronic obstructive pulmonary disease) (HCC)    Enlarged prostate    GERD (gastroesophageal reflux disease)    Glaucoma    Glucagonoma    Hernia, incisional    at present   [facility] (hard of hearing)    IBS (irritable bowel syndrome)    Sciatic pain    Sinus congestion     Current Outpatient Medications:    acetaminophen (TYLENOL) 325 MG tablet, Take 2 tablets (650 mg total) by mouth every 4 (four) hours as needed for mild pain (or temp > 37.5 C (99.5 F))., Disp: , Rfl:    apixaban (ELIQUIS) 2.5 MG TABS tablet, Take 1 tablet (2.5 mg total) by mouth 2 (two) times daily., Disp: 60 tablet, Rfl: 3   aspirin EC 325 MG EC tablet, Take 1 tablet (325 mg total) by mouth daily., Disp: 30 tablet, Rfl: 3   atorvastatin (LIPITOR) 40 MG tablet, Take 1 tablet (40 mg total) by mouth daily., Disp: 30 tablet, Rfl: 3   azelastine (ASTELIN) 0.1 % nasal spray, PLACE 1 SPRAY IN EACH NOSTRIL ONCE A DAY AS DIRECTED (Patient taking differently: Place 1 spray into both nostrils at  bedtime.), Disp: 30 mL, Rfl: 4   BREO ELLIPTA 200-25 MCG/INH AEPB, Inhale 1 puff into the lungs daily., Disp: 60 each, Rfl: 4   Cholecalciferol (VITAMIN D3) 2000 UNITS TABS, Take 1 tablet by mouth daily., Disp: , Rfl:    Cyanocobalamin (VITAMIN B-12) 5000 MCG SUBL, Place 1 tablet under the tongue daily., Disp: , Rfl:    finasteride (PROSCAR) 5 MG tablet, Take 5 mg by mouth daily., Disp: , Rfl:    fluticasone (FLONASE) 50 MCG/ACT nasal spray, Place 2 sprays into both nostrils daily., Disp: , Rfl:    guaiFENesin (MUCINEX) 600 MG 12 hr tablet, Take 600 mg by mouth daily as needed for to loosen phlegm., Disp: , Rfl:    latanoprost (XALATAN) 0.005 % ophthalmic solution, Place 1 drop into the left eye at bedtime. (Patient taking differently: Place 1 drop into both eyes at bedtime.), Disp: 2.5 mL, Rfl: 1   pantoprazole (PROTONIX) 40 MG tablet, Take 1 tablet (40 mg total) by mouth daily., Disp: 30 tablet, Rfl: 2   silodosin (RAPAFLO) 8 MG CAPS capsule, Take 8 mg by mouth daily., Disp: , Rfl:    timolol (TIMOPTIC) 0.5 % ophthalmic solution, Place 1 drop into the left eye every morning., Disp: , Rfl:    VENTOLIN HFA 108 (90 Base) MCG/ACT inhaler, INHALE 2 PUFFS EVERY 6 HOURS AS NEEDED FOR WHEEZING  OR SHORTNESS OF BREATH (Patient taking differently: 1 puff every 6 (six) hours as needed for shortness of breath.), Disp: 18 g, Rfl: 0 Social History   Socioeconomic History   Marital status: Married    Spouse name: [name]   Number of children: 3   Years of education: 12+   Highest education level: Bachelor's degree (e.g., BA, AB, BS)  Occupational History   Occupation: Retired    Comment: Immunologist  Tobacco Use   Smoking status: Former    Packs/day: 1.00    Years: 30.00    Pack years: 30.00    Types: Cigarettes    Quit date: 05/28/2006    Years since quitting: 15.0   Smokeless tobacco: Never  Vaping Use   Vaping Use: Never used  Substance and Sexual Activity   Alcohol use: Not Currently     Alcohol/week: 0.0 standard drinks   Drug use: No   Sexual activity: Yes    Birth control/protection: None  Other Topics Concern   Not on file  Social History Narrative   Patient lives at home with [name].    Patient has 3 children.    Patient has his BS   Patient is retired.    Drinks about 2 cups of coffee per day.      [facility]:   He is still married. 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 He is from [place].       Social Determinants of Health   Financial Resource Strain: Not on file  Food Insecurity: Not on file  Transportation Needs: Not on file  Physical Activity: Insufficiently Active   Days of Exercise per Week: 1 day   Minutes of Exercise per Session: 10 min  Stress: Stress Concern Present   Feeling of Stress : Rather much  Social Connections: Not on file  Intimate Partner Violence: Not on file   Family History  Problem Relation Age of Onset   Heart disease Mother        Valve replacement and pacemaker   Colon cancer Father    Asthma Other        cousin   Healthy Daughter    Healthy Daughter    Healthy Daughter     Objective: Office vital signs reviewed. BP 119/72    Pulse 63    Temp 97.6 F (36.4 C)    Ht 5\' 10"  (1.778 m)    Wt 169 lb (76.7 kg)    SpO2 98%    BMI 24.25 kg/m   Physical Examination:  General: Awake, alert, nontoxic, elderly male, No acute distress HEENT: No carotid bruits Cardio: regular rate and rhythm, S1S2 heard, no murmurs appreciated Pulm: clear to auscultation bilaterally, no wheezes, rhonchi or rales; normal work of breathing on room air Extremities: warm, well perfused, No edema, cyanosis or clubbing; +2 pulses bilaterally  Assessment/ Plan: 88 y.o. male   Fluctuating blood pressure  History of CVA in adulthood  Chronic anticoagulation  Assured that some fluctuation of blood pressure is normal.  We discussed threshold for  addition of medication would be blood pressures consistently above 150/90.  We discussed how to appropriately check her blood pressure today.  I encouraged them to check once daily but up to twice daily should the need arise.  Continue medications as outlined by his specialists.  I have adjusted his Eliquis prescription to be a 90-day supply for more convenience.  May  follow-up in the next 3-6 months, sooner if concerns arise    No orders of the defined types were placed in this encounter.  No orders of the defined types were placed in this encounter.    [name], DO [place] [phone]

## 2021-06-21 DIAGNOSIS — R351 Nocturia: Secondary | ICD-10-CM | POA: Diagnosis not present

## 2021-06-21 DIAGNOSIS — R3914 Feeling of incomplete bladder emptying: Secondary | ICD-10-CM | POA: Diagnosis not present

## 2021-06-21 DIAGNOSIS — N481 Balanitis: Secondary | ICD-10-CM | POA: Diagnosis not present

## 2021-06-21 DIAGNOSIS — N401 Enlarged prostate with lower urinary tract symptoms: Secondary | ICD-10-CM | POA: Diagnosis not present

## 2021-06-22 ENCOUNTER — Ambulatory Visit: Payer: Medicare Other | Admitting: Neurology

## 2021-06-27 ENCOUNTER — Other Ambulatory Visit: Payer: Self-pay | Admitting: Family Medicine

## 2021-06-27 NOTE — Telephone Encounter (Signed)
Last office visit 06/16/21 Medication is on patient med list but not prescribed by our office so I cannot refill

## 2021-07-06 DIAGNOSIS — H353131 Nonexudative age-related macular degeneration, bilateral, early dry stage: Secondary | ICD-10-CM | POA: Diagnosis not present

## 2021-07-06 DIAGNOSIS — H401122 Primary open-angle glaucoma, left eye, moderate stage: Secondary | ICD-10-CM | POA: Diagnosis not present

## 2021-07-06 DIAGNOSIS — Z961 Presence of intraocular lens: Secondary | ICD-10-CM | POA: Diagnosis not present

## 2021-07-06 DIAGNOSIS — H40051 Ocular hypertension, right eye: Secondary | ICD-10-CM | POA: Diagnosis not present

## 2021-07-13 ENCOUNTER — Encounter: Payer: Self-pay | Admitting: Family Medicine

## 2021-07-17 ENCOUNTER — Ambulatory Visit (INDEPENDENT_AMBULATORY_CARE_PROVIDER_SITE_OTHER): Payer: Medicare Other

## 2021-07-17 DIAGNOSIS — N1831 Chronic kidney disease, stage 3a: Secondary | ICD-10-CM

## 2021-07-17 DIAGNOSIS — G459 Transient cerebral ischemic attack, unspecified: Secondary | ICD-10-CM

## 2021-07-17 DIAGNOSIS — M5459 Other low back pain: Secondary | ICD-10-CM | POA: Diagnosis not present

## 2021-07-17 DIAGNOSIS — I4891 Unspecified atrial fibrillation: Secondary | ICD-10-CM

## 2021-07-17 DIAGNOSIS — Z8673 Personal history of transient ischemic attack (TIA), and cerebral infarction without residual deficits: Secondary | ICD-10-CM

## 2021-07-17 DIAGNOSIS — J449 Chronic obstructive pulmonary disease, unspecified: Secondary | ICD-10-CM

## 2021-07-17 DIAGNOSIS — K582 Mixed irritable bowel syndrome: Secondary | ICD-10-CM

## 2021-07-17 DIAGNOSIS — K219 Gastro-esophageal reflux disease without esophagitis: Secondary | ICD-10-CM

## 2021-07-17 DIAGNOSIS — N401 Enlarged prostate with lower urinary tract symptoms: Secondary | ICD-10-CM

## 2021-07-17 NOTE — Chronic Care Management (AMB) (Signed)
Chronic Care Management    Clinical Social Work Note  07/17/2021 Name: Charles Golden MRN: [id number] DOB: Feb 02, 1926  Charles Golden is a 88 y.o. year old male who is a primary care patient of [name], DO. The CCM team was consulted to assist the patient with chronic disease management and/or care coordination needs related to: [employer] .   Engaged with patient by telephone for follow up visit in response to provider referral for social work chronic care management and care coordination services.   Consent to Services:  The patient was given information about Chronic Care Management services, agreed to services, and gave verbal consent prior to initiation of services.  Please see initial visit note for detailed documentation.   Patient agreed to services and consent obtained.   Assessment: Review of patient past medical history, allergies, medications, and health status, including review of relevant consultants reports was performed today as part of a comprehensive evaluation and provision of chronic care management and care coordination services.     SDOH (Social Determinants of Health) assessments and interventions performed:  SDOH Interventions    Flowsheet Row Most Recent Value  SDOH Interventions   Stress Interventions Provide Counseling  [client has stress related to managing medical needs. client has stress related to grief issues faced]  Depression Interventions/Treatment  Counseling        Advanced Directives Status: See Vyn[employer] application for related entries.  CCM Care Plan  Allergies  Allergen Reactions   Demerol [Meperidine] Other (See Comments)    Pt. States "he woke up during a colonoscopy"   Spiriva Handihaler [Tiotropium Bromide Monohydrate] Other (See Comments)    Mild Urinary Retention    Outpatient Encounter Medications as of 07/17/2021  Medication Sig Note   acetaminophen (TYLENOL) 325 MG tablet Take 2 tablets (650 mg total) by mouth  every 4 (four) hours as needed for mild pain (or temp > 37.5 C (99.5 F)).    apixaban (ELIQUIS) 2.5 MG TABS tablet Take 1 tablet (2.5 mg total) by mouth 2 (two) times daily.    aspirin EC 325 MG EC tablet Take 1 tablet (325 mg total) by mouth daily. 04/29/2021: From Hospital   atorvastatin (LIPITOR) 40 MG tablet Take 1 tablet (40 mg total) by mouth daily. 04/29/2021: From Hospital   azelastine (ASTELIN) 0.1 % nasal spray PLACE 1 SPRAY IN EACH NOSTRIL ONCE A DAY AS DIRECTED (Patient taking differently: Place 1 spray into both nostrils at bedtime.)    BREO ELLIPTA 200-25 MCG/INH AEPB Inhale 1 puff into the lungs daily.    Cholecalciferol (VITAMIN D3) 2000 UNITS TABS Take 1 tablet by mouth daily.    Cyanocobalamin (VITAMIN B-12) 5000 MCG SUBL Place 1 tablet under the tongue daily.    finasteride (PROSCAR) 5 MG tablet Take 5 mg by mouth daily.    fluticasone (FLONASE) 50 MCG/ACT nasal spray Place 2 sprays into both nostrils daily.    guaiFENesin (MUCINEX) 600 MG 12 hr tablet Take 600 mg by mouth daily as needed for to loosen phlegm.    latanoprost (XALATAN) 0.005 % ophthalmic solution Place 1 drop into the left eye at bedtime. (Patient taking differently: Place 1 drop into both eyes at bedtime.)    pantoprazole (PROTONIX) 40 MG tablet Take 1 tablet (40 mg total) by mouth daily.    silodosin (RAPAFLO) 8 MG CAPS capsule Take 8 mg by mouth daily.    timolol (TIMOPTIC) 0.5 % ophthalmic solution Place 1 drop into the left eye  every morning.    VENTOLIN HFA 108 (90 Base) MCG/ACT inhaler INHALE 2 PUFFS EVERY 6 HOURS AS NEEDED FOR WHEEZING OR SHORTNESS OF BREATH (Patient taking differently: 1 puff every 6 (six) hours as needed for shortness of breath.)    No facility-administered encounter medications on file as of 07/17/2021.    Patient Active Problem List   Diagnosis Date Noted   Sepsis secondary to UTI ([place]) 05/10/2021   Acute renal failure superimposed on stage 3b chronic kidney disease ([place]) 05/10/2021    Atrial fibrillation with RVR ([place]) 05/10/2021   Skin infection 05/08/2021   Protein-calorie malnutrition, severe 05/05/2021   CVA (cerebral vascular accident) ([place]) 05/01/2021   Stenosis of left carotid artery    Acute ischemic stroke ([place]) 04/29/2021   TIA (transient ischemic attack) 04/27/2021   Chronic tension-type headache, not intractable 12/13/2020   Bilateral sensorineural hearing loss 09/25/2019   Bronchiectasis without complication ([place]) 46/27/0350   Fatigue 05/27/2019   Vitamin D deficiency 01/21/2019   Weakness 01/21/2019   Recurrent right inguinal hernia    Presbycusis of both ears 07/19/2017   Restless leg syndrome 11/25/2016   Collagenous colitis 07/24/2016   Deviated nasal septum 09/03/2015   Basal cell carcinoma of nose 09/03/2015   Thoracic aortic atherosclerosis ([place]) 05/03/2015   Upper airway cough syndrome 04/28/2015   GERD without esophagitis 04/11/2015   Acute medial meniscal tear 07/20/2014   COPD GOLD I  12/08/2013   Small bowel volvulus ([place]) 08/05/2013   Chronic headache 08/05/2013   Left inguinal hernia 08/05/2013   Incisional hernia, without obstruction or gangrene 08/05/2013   Headache 05/01/2013   Obstructive chronic bronchitis without exacerbation ([place]) 04/30/2013   Left hip pain 04/29/2013   BPH (benign prostatic hyperplasia) 01/05/2013   IBS (irritable bowel syndrome) 09/03/2012   Chronic rhinosinusitis 09/03/2012   Lung nodules 05/04/2003    Conditions to be addressed/monitored: monitor client management of grief issues faced  Care Plan : LCSW Care Plan  Updates made by Katha Cabal, LCSW since 07/17/2021 12:00 AM     Problem: Emotional Distress      Goal: Emotional Health Supported. Manage Grief issues. Manage Depression issues   Start Date: 07/17/2021  Expected End Date: 10/12/2021  This Visit's Progress: On track  Recent Progress: Not on track  Priority: Medium  Note:   Current Barriers:  Grief issues faced Depression  issues faced Suicidal Ideation/Homicidal Ideation: No  Clinical Social Work Goal(s):  patient will work with SW monthly by telephone or in person to reduce or manage symptoms related to grief issues faced Patient will work with SW monthly by telephone or in person to manage depression issues faced Patient will communicate as needed in next 30 days with [facility] for CCM nursing support Patient will attend scheduled medical appointments in next 30 days  Interventions: 1:1 collaboration with [name], DO regarding development and update of comprehensive plan of care as evidenced by provider attestation and co-signature Discussed client needs with [name], Discussed appetite of client. Client said he was eating adequately.   Reviewed walking of client. Client said he was no longer using a walker to walk. He is ambulating without a device. Provided counseling support to client.  Discussed grief issues of client. Client said he has support of his 3 daughters and this is very helpful. He said they help him with in home care needs.  Client and LCSW discussed Grief Share group support at his church. He said he had been going  to Grief Share group at his church and that attending this group has been very helpful to him. His wife died in 05-10-21. LCSW encouraged client to keep attending  Grief Share group at his church, when he is able to attend group Discussed family support for client. Client has one daughter who resides in [place], [place]; he has a daughter who resides in [place], [place]; he has a daughter who resides in [place], [place] Encouraged client or daughter of client to call RNCM as needed for CCM nursing support for client Reviewed pain issues. He said he has been having some hip pain issues. He said he has an appointment this afternoon with medical provider regarding his hip pain issues.   Patient Self Care Activities:  Attends scheduled medical appointments Participates in [place]  as scheduled  Patient Coping Strengths:  Has strong family support Has support from friends at church Enjoys reading about history  Patient Self Care Deficits:  Depression issues Grief issues  Patient Goals:  - spend time or talk with others at least 2 to 3 times per week - practice relaxation or meditation daily - keep a calendar with appointment dates  Follow Up Plan: LCSW to call client or daughter of client on 09/11/21 at 2:00 PM to assess client needs      Norva Riffle.Srija Southard MSW, [place] [facility] representative [facility] Care Management [phone]

## 2021-07-17 NOTE — Patient Instructions (Addendum)
Visit Information  Patient Goals:  Depressive Symptoms identified. Manage Depression issues. Manage Grief issues  Time Frame:  Short Term Goal Priority:  Medium Progress:  On Track  Start Date:  07/17/21   Expected End Date:  10/12/21    Follow Up Date;  09/11/21 at 2:00 PM   Depressive Symptoms Identified.  Manage Depression issues. Manage Grief issues  Patient Self Care Activities:  Attends scheduled medical appointments Participates in [place] as scheduled  Patient Coping Strengths:  Has strong family support Has support from friends at church Enjoys reading about history  Patient Self Care Deficits:  Depression issues Grief issues  Patient Goals:  - spend time or talk with others at least 2 to 3 times per week - practice relaxation or meditation daily - keep a calendar with appointment dates  Follow Up Plan: LCSW to call client or daughter of client on 09/11/21 at 2:00 PM to assess client needs   Norva Riffle.Kaprice Kage MSW, [place] [facility] representative [facility] Care Management [phone]

## 2021-07-18 ENCOUNTER — Inpatient Hospital Stay: Payer: Medicare Other | Admitting: Neurology

## 2021-07-25 DIAGNOSIS — G459 Transient cerebral ischemic attack, unspecified: Secondary | ICD-10-CM

## 2021-07-25 DIAGNOSIS — I4891 Unspecified atrial fibrillation: Secondary | ICD-10-CM

## 2021-07-25 DIAGNOSIS — N401 Enlarged prostate with lower urinary tract symptoms: Secondary | ICD-10-CM

## 2021-07-25 DIAGNOSIS — N1831 Chronic kidney disease, stage 3a: Secondary | ICD-10-CM

## 2021-07-25 DIAGNOSIS — J449 Chronic obstructive pulmonary disease, unspecified: Secondary | ICD-10-CM

## 2021-07-25 DIAGNOSIS — R351 Nocturia: Secondary | ICD-10-CM

## 2021-07-31 ENCOUNTER — Other Ambulatory Visit: Payer: Self-pay | Admitting: Family Medicine

## 2021-07-31 NOTE — Telephone Encounter (Signed)
Last office visit 06/16/21 ?Medication appears to be prescribed by another office ?

## 2021-08-01 ENCOUNTER — Ambulatory Visit (INDEPENDENT_AMBULATORY_CARE_PROVIDER_SITE_OTHER): Payer: Medicare Other | Admitting: *Deleted

## 2021-08-01 DIAGNOSIS — I4891 Unspecified atrial fibrillation: Secondary | ICD-10-CM

## 2021-08-01 DIAGNOSIS — J449 Chronic obstructive pulmonary disease, unspecified: Secondary | ICD-10-CM

## 2021-08-02 NOTE — Patient Instructions (Signed)
Visit Information ? ?Patient Goals/Self-Care Activities: ?Take medications as prescribed   ?Attend all scheduled provider appointments ?Perform IADL's (shopping, preparing meals, housekeeping, managing finances) independently ?Call provider office for new concerns or questions  ?Call RN Care Manager as needed [phone] ?Seek appropriate medical attention for any new or worsening symptoms ? ?Patient verbalizes understanding of instructions and care plan provided today and agrees to view in [place]. Active MyChart status confirmed with patient.   ? ?Plan:Telephone follow up appointment with care management team member scheduled for:  09/01/21 with RNCM ?The patient has been provided with contact information for the care management team and has been advised to call with any health related questions or concerns.  ? ?Chong Sicilian, BSN, RN-BC ?Embedded Chronic Care Manager ?[place] [facility] Management ?Direct Dial: [phone] ?  ?

## 2021-08-02 NOTE — Chronic Care Management (AMB) (Addendum)
?Chronic Care Management  ? ?CCM RN Visit Note ? ?08/01/2021 ?Name: Charles Golden MRN: [id number] DOB: [birth year] ? ?Subjective: ?Charles Golden is a 88 y.o. year old male who is a primary care patient of [name], DO. The care management team was consulted for assistance with disease management and care coordination needs.   ? ?Engaged with patient by telephone for follow up visit in response to provider referral for case management and/or care coordination services.  ? ?Consent to Services:  ?The patient was given information about Chronic Care Management services, agreed to services, and gave verbal consent prior to initiation of services.  Please see initial visit note for detailed documentation.  ? ?Patient agreed to services and verbal consent obtained.  ? ?Assessment: Review of patient past medical history, allergies, medications, health status, including review of consultants reports, laboratory and other test data, was performed as part of comprehensive evaluation and provision of chronic care management services.  ? ?SDOH (Social Determinants of Health) assessments and interventions performed:   ? ?CCM Care Plan ? ?Allergies  ?Allergen Reactions  ? Demerol [Meperidine] Other (See Comments)  ?  Pt. States "he woke up during a colonoscopy"  ? Spiriva Handihaler [Tiotropium Bromide Monohydrate] Other (See Comments)  ?  Mild Urinary Retention  ? ? ?Outpatient Encounter Medications as of 08/01/2021  ?Medication Sig Note  ? acetaminophen (TYLENOL) 325 MG tablet Take 2 tablets (650 mg total) by mouth every 4 (four) hours as needed for mild pain (or temp > 37.5 C (99.5 F)).   ? apixaban (ELIQUIS) 2.5 MG TABS tablet Take 1 tablet (2.5 mg total) by mouth 2 (two) times daily.   ? aspirin EC 325 MG EC tablet Take 1 tablet (325 mg total) by mouth daily. 04/29/2021: From Hospital  ? atorvastatin (LIPITOR) 40 MG tablet Take 1 tablet (40 mg total) by mouth daily. 04/29/2021: From Hospital  ? azelastine (ASTELIN) 0.1 %  nasal spray PLACE 1 SPRAY IN EACH NOSTRIL ONCE A DAY AS DIRECTED (Patient taking differently: Place 1 spray into both nostrils at bedtime.)   ? BREO ELLIPTA 200-25 MCG/INH AEPB Inhale 1 puff into the lungs daily.   ? Cholecalciferol (VITAMIN D3) 2000 UNITS TABS Take 1 tablet by mouth daily.   ? Cyanocobalamin (VITAMIN B-12) 5000 MCG SUBL Place 1 tablet under the tongue daily.   ? finasteride (PROSCAR) 5 MG tablet Take 5 mg by mouth daily.   ? fluticasone (FLONASE) 50 MCG/ACT nasal spray Place 2 sprays into both nostrils daily.   ? guaiFENesin (MUCINEX) 600 MG 12 hr tablet Take 600 mg by mouth daily as needed for to loosen phlegm.   ? latanoprost (XALATAN) 0.005 % ophthalmic solution Place 1 drop into the left eye at bedtime. (Patient taking differently: Place 1 drop into both eyes at bedtime.)   ? pantoprazole (PROTONIX) 40 MG tablet TAKE 1 TABLET DAILY   ? silodosin (RAPAFLO) 8 MG CAPS capsule Take 8 mg by mouth daily.   ? timolol (TIMOPTIC) 0.5 % ophthalmic solution Place 1 drop into the left eye every morning.   ? VENTOLIN HFA 108 (90 Base) MCG/ACT inhaler INHALE 2 PUFFS EVERY 6 HOURS AS NEEDED FOR WHEEZING OR SHORTNESS OF BREATH (Patient taking differently: 1 puff every 6 (six) hours as needed for shortness of breath.)   ? ?No facility-administered encounter medications on file as of 08/01/2021.  ? ? ?Patient Active Problem List  ? Diagnosis Date Noted  ? Sepsis secondary to UTI ([place]) 05/10/2021  ?  Acute renal failure superimposed on stage 3b chronic kidney disease ([place]) 05/10/2021  ? Atrial fibrillation with RVR ([place]) 05/10/2021  ? Skin infection 05/08/2021  ? Protein-calorie malnutrition, severe 05/05/2021  ? CVA (cerebral vascular accident) ([place]) 05/01/2021  ? Stenosis of left carotid artery   ? Acute ischemic stroke ([place]) 04/29/2021  ? TIA (transient ischemic attack) 04/27/2021  ? Chronic tension-type headache, not intractable 12/13/2020  ? Bilateral sensorineural hearing loss 09/25/2019  ? Bronchiectasis  without complication ([place]) 29/93/7169  ? Fatigue 05/27/2019  ? Vitamin D deficiency 01/21/2019  ? Weakness 01/21/2019  ? Recurrent right inguinal hernia   ? Presbycusis of both ears 07/19/2017  ? Restless leg syndrome 11/25/2016  ? Collagenous colitis 07/24/2016  ? Deviated nasal septum 09/03/2015  ? Basal cell carcinoma of nose 09/03/2015  ? Thoracic aortic atherosclerosis ([place]) 05/03/2015  ? Upper airway cough syndrome 04/28/2015  ? GERD without esophagitis 04/11/2015  ? Acute medial meniscal tear 07/20/2014  ? COPD GOLD I  12/08/2013  ? Small bowel volvulus ([place]) 08/05/2013  ? Chronic headache 08/05/2013  ? Left inguinal hernia 08/05/2013  ? Incisional hernia, without obstruction or gangrene 08/05/2013  ? Headache 05/01/2013  ? Obstructive chronic bronchitis without exacerbation ([place]) 04/30/2013  ? Left hip pain 04/29/2013  ? BPH (benign prostatic hyperplasia) 01/05/2013  ? IBS (irritable bowel syndrome) 09/03/2012  ? Chronic rhinosinusitis 09/03/2012  ? Lung nodules 05/04/2003  ? ? ?Conditions to be addressed/monitored:Atrial Fibrillation and Pulmonary Disease ? ?Care Plan : [facility] Care Plan  ?Updates made by Ilean China, RN since 08/02/2021 12:00 AM  ?  ? ?Problem: Chronic Disease Management Needs   ?Priority: High  ?Onset Date: 08/01/2021  ?  ? ?Long-Range Goal: Patient will Work with Consulting civil engineer Regarding Care Management and [place] with CVA, BPH, COPD, CKD, A Fib   ?Start Date: 08/01/2021  ?Expected End Date: 08/02/2022  ?This Visit's Progress: On track  ?Priority: High  ?Note:   ?Current Barriers:  ?Chronic Disease Management support and education needs related to CVA, BPH, COPD, CKD, A Fib ?Advanced age ? ?RNCM Clinical Goal(s):  ?Patient will continue to work with RN Care Manager and/or Social Worker to address care management and care coordination needs related to CVA, BPH, COPD, CKD, A Fib as evidenced by adherence to CM Team Scheduled appointments     through collaboration with LCSW,  provider, and care team.  ? ?Interventions: ?1:1 collaboration with primary care provider regarding development and update of comprehensive plan of care as evidenced by provider attestation and co-signature ?Inter-disciplinary care team collaboration (see longitudinal plan of care) ?Evaluation of current treatment plan related to  self management and patient's adherence to plan as established by provider ?Reviewed LCSW note  ?Assessed family/social support ?Discussed mobility and ability to perform ADLs ?Discussed current hip/side pain. Has seen ortho and given prednisone. Continues to have pain. Collaborated with [facility] Clinical Staff to schedule an appt with PCP on 08/04/21. ? ? ?A-fib:  (Status: New goal.) Long Term Goal  ?Reviewed importance of adherence to anticoagulant exactly as prescribed ?Counseled on bleeding risk associated with Eliquis and importance of self-monitoring for signs/symptoms of bleeding ?Counseled on seeking medical attention after a head injury or if there is blood in the urine/stool ?Assessed social determinant of health barriers ? ? ?Patient Goals/Self-Care Activities: ?Take medications as prescribed   ?Attend all scheduled provider appointments ?Perform IADL's (shopping, preparing meals, housekeeping, managing finances) independently ?Call provider office for new concerns or questions  ?Call  RN Care Manager as needed [phone] ?Seek appropriate medical attention for any new or worsening symptoms ? ?Plan:Telephone follow up appointment with care management team member scheduled for:  09/01/21 with RNCM ?The patient has been provided with contact information for the care management team and has been advised to call with any health related questions or concerns.  ? ?Chong Sicilian, BSN, RN-BC ?Embedded Chronic Care Manager ?[place] [facility] Management ?Direct Dial: [phone] ?  ? ? ? ? ? ? ? ? ? ? ? ? ?

## 2021-08-03 ENCOUNTER — Encounter: Payer: Self-pay | Admitting: Neurology

## 2021-08-03 ENCOUNTER — Ambulatory Visit (INDEPENDENT_AMBULATORY_CARE_PROVIDER_SITE_OTHER): Payer: Medicare Other | Admitting: Neurology

## 2021-08-03 VITALS — BP 106/64 | HR 66 | Ht 70.0 in | Wt 166.0 lb

## 2021-08-03 DIAGNOSIS — I480 Paroxysmal atrial fibrillation: Secondary | ICD-10-CM

## 2021-08-03 DIAGNOSIS — I6522 Occlusion and stenosis of left carotid artery: Secondary | ICD-10-CM | POA: Diagnosis not present

## 2021-08-03 DIAGNOSIS — Z95828 Presence of other vascular implants and grafts: Secondary | ICD-10-CM | POA: Diagnosis not present

## 2021-08-03 DIAGNOSIS — I63512 Cerebral infarction due to unspecified occlusion or stenosis of left middle cerebral artery: Secondary | ICD-10-CM | POA: Diagnosis not present

## 2021-08-03 DIAGNOSIS — Z9889 Other specified postprocedural states: Secondary | ICD-10-CM | POA: Diagnosis not present

## 2021-08-03 MED ORDER — ASPIRIN EC 81 MG PO TBEC: 81.0000 mg | DELAYED_RELEASE_TABLET | Freq: Every day | ORAL | 11 refills | Status: DC

## 2021-08-03 NOTE — Patient Instructions (Signed)
I had a long d/w patient and his daughter about his recent stroke,carotid stenosis and stenting, new diagnosis of atrial fibrillation, risk for recurrent stroke/TIAs, personally independently reviewed imaging studies and stroke evaluation results and answered questions.Continue aspirin 81 mg daily and Eliquis (apixaban) daily  for secondary stroke prevention  given carotid stent and atrial fibrillation and maintain strict control of hypertension with blood pressure goal below 130/90, diabetes with hemoglobin A1c goal below 6.5% and lipids with LDL cholesterol goal below 70 mg/dL. I also advised the patient to eat a healthy diet with plenty of whole grains, cereals, fruits and vegetables, exercise regularly and maintain ideal body weight . Check carotid ultrasound for stent surveillance.Followup in the future with me in 6 months or call earlier if needed. Stroke Prevention Some medical conditions and behaviors can lead to a higher chance of having a stroke. You can help prevent a stroke by eating healthy, exercising, not smoking, and managing any medical conditions you have. Stroke is a leading cause of functional impairment. Primary prevention is particularly important because a majority of strokes are first-time events. Stroke changes the lives of not only those who experience a stroke but also their family and other caregivers. How can this condition affect me? A stroke is a medical emergency and should be treated right away. A stroke can lead to brain damage and can sometimes be life-threatening. If a person gets medical treatment right away, there is a better chance of surviving and recovering from a stroke. What can increase my risk? The following medical conditions may increase your risk of a stroke: Cardiovascular disease. High blood pressure (hypertension). Diabetes. High cholesterol. Sickle cell disease. Blood clotting disorders (hypercoagulable state). Obesity. Sleep disorders (obstructive  sleep apnea). Other risk factors include: Being older than age 32. Having a history of blood clots, stroke, or mini-stroke (transient ischemic attack, TIA). Genetic factors, such as race, ethnicity, or a family history of stroke. Smoking cigarettes or using other tobacco products. Taking birth control pills, especially if you also use tobacco. Heavy use of alcohol or drugs, especially cocaine and methamphetamine. Physical inactivity. What actions can I take to prevent this? Manage your health conditions High cholesterol levels. Eating a healthy diet is important for preventing high cholesterol. If cholesterol cannot be managed through diet alone, you may need to take medicines. Take any prescribed medicines to control your cholesterol as told by your health care provider. Hypertension. To reduce your risk of stroke, try to keep your blood pressure below 130/80. Eating a healthy diet and exercising regularly are important for controlling blood pressure. If these steps are not enough to manage your blood pressure, you may need to take medicines. Take any prescribed medicines to control hypertension as told by your health care provider. Ask your health care provider if you should monitor your blood pressure at home. Have your blood pressure checked every year, even if your blood pressure is normal. Blood pressure increases with age and some medical conditions. Diabetes. Eating a healthy diet and exercising regularly are important parts of managing your blood sugar (glucose). If your blood sugar cannot be managed through diet and exercise, you may need to take medicines. Take any prescribed medicines to control your diabetes as told by your health care provider. Get evaluated for obstructive sleep apnea. Talk to your health care provider about getting a sleep evaluation if you snore a lot or have excessive sleepiness. Make sure that any other medical conditions you have, such as atrial  fibrillation or  atherosclerosis, are managed. Nutrition Follow instructions from your health care provider about what to eat or drink to help manage your health condition. These instructions may include: Reducing your daily calorie intake. Limiting how much salt (sodium) you use to 1,500 milligrams (mg) each day. Using only healthy fats for cooking, such as olive oil, canola oil, or sunflower oil. Eating healthy foods. You can do this by: Choosing foods that are high in fiber, such as whole grains, and fresh fruits and vegetables. Eating at least 5 servings of fruits and vegetables a day. Try to fill one-half of your plate with fruits and vegetables at each meal. Choosing lean protein foods, such as lean cuts of meat, poultry without skin, fish, tofu, beans, and nuts. Eating low-fat dairy products. Avoiding foods that are high in sodium. This can help lower blood pressure. Avoiding foods that have saturated fat, trans fat, and cholesterol. This can help prevent high cholesterol. Avoiding processed and prepared foods. Counting your daily carbohydrate intake.  Lifestyle If you drink alcohol: Limit how much you have to: 0-1 drink a day for women who are not pregnant. 0-2 drinks a day for men. Know how much alcohol is in your drink. In the [place] one drink equals one 12 oz bottle of beer (36m), one 5 oz glass of wine (1438m, or one 1 oz glass of hard liquor (4466m Do not use any products that contain nicotine or tobacco. These products include cigarettes, chewing tobacco, and vaping devices, such as e-cigarettes. If you need help quitting, ask your health care provider. Avoid secondhand smoke. Do not use drugs. Activity  Try to stay at a healthy weight. Get at least 30 minutes of exercise on most days, such as: Fast walking. Biking. Swimming. Medicines Take over-the-counter and prescription medicines only as told by your health care provider. Aspirin or blood thinners (antiplatelets  or anticoagulants) may be recommended to reduce your risk of forming blood clots that can lead to stroke. Avoid taking birth control pills. Talk to your health care provider about the risks of taking birth control pills if: You are over 35 25ars old. You smoke. You get very bad headaches. You have had a blood clot. Where to find more information American Stroke Association: www.strokeassociation.org Get help right away if: You or a loved one has any symptoms of a stroke. "BE FAST" is an easy way to remember the main warning signs of a stroke: B - Balance. Signs are dizziness, sudden trouble walking, or loss of balance. E - Eyes. Signs are trouble seeing or a sudden change in vision. F - Face. Signs are sudden weakness or numbness of the face, or the face or eyelid drooping on one side. A - Arms. Signs are weakness or numbness in an arm. This happens suddenly and usually on one side of the body. S - Speech. Signs are sudden trouble speaking, slurred speech, or trouble understanding what people say. T - Time. Time to call emergency services. Write down what time symptoms started. You or a loved one has other signs of a stroke, such as: A sudden, severe headache with no known cause. Nausea or vomiting. Seizure. These symptoms may represent a serious problem that is an emergency. Do not wait to see if the symptoms will go away. Get medical help right away. Call your local emergency services (911 in the [place] Do not drive yourself to the hospital. Summary You can help to prevent a stroke by eating healthy, exercising, not smoking, limiting alcohol intake,  and managing any medical conditions you may have. Do not use any products that contain nicotine or tobacco. These include cigarettes, chewing tobacco, and vaping devices, such as e-cigarettes. If you need help quitting, ask your health care provider. Remember "BE FAST" for warning signs of a stroke. Get help right away if you or a loved one has  any of these signs. This information is not intended to replace advice given to you by your health care provider. Make sure you discuss any questions you have with your health care provider. Document Revised: 12/14/2019 Document Reviewed: 12/14/2019 Elsevier Patient Education  [place].

## 2021-08-03 NOTE — Progress Notes (Signed)
[facility] [address] [place]. [place] [address] [phone]       OFFICE FOLLOW-UP NOTE  [name] Date of Birth:  1926/03/13 Medical Record Number:  [id number]   HPI: [name] is a 88 year old pleasant Caucasian male seen today for initial office follow-up visit following hospital consultation for stroke in December 2022.  He is accompanied by his daughter.  History is obtained from them and review of electronic medical records and opossum reviewed pertinent available imaging films in PACS. Charles Golden is an 88 y.o. male presenting to the ED with a chief complaint of dysarthria and difficulty ambulating which suddenly started after he had attended the funeral of his wife. He was evaluated in triage and a Code Stroke was called. Symptoms started about 2 hours prior to presentation and resolved after about 10 minutes. He reported sitting in the car with his son, unable to speak any sentences, feeling like he was unable to find his words. He had difficulty ambulating after getting out of the car. Symptoms continued to be resolved in Triage and in CT after the Code Stroke was called.   CT head revealed no acute hemorrhage or hypodensity, but a dense left MCA sign was noted and CTA was then ordered, revealing a left ICA occlusion and left M2 occlusion. Patient has remained asymptomatic with a normal neurological exam while in the ED. patient's NIH stroke scale was 0 and she was not considered a candidate for urgent mechanical thrombectomy of carotid revascularization.  He was discharged home on aspirin and Plavix for 3 months and statin was added.  He however returned the next day on 04/29/2021 with recurrence of aphasia and difficulty walking and stumbling.  NIH stroke scale was again quite low at 1.  MRI scan of the brain showed 2 areas of acute infarction in the left frontal lobe the larger one being in the insula.  2D echo showed ejection fraction 60-65%.  LDL  cholesterol 50 mg percent.  Hemoglobin A1c was 5.8.  He was again counseled to avoid hypotension and discharged home.  He returned again later the next day with worsening symptoms of aphasia and speech difficulties.  His BP was again low and he was given IV fluids and his symptoms resolved as blood pressure came up.  He was monitored carefully and underwent elective left carotid angioplasty stenting by Dr. Norma Fredrickson on 05/04/2021.  Patient did well postprocedure and was discharged home.  He is currently living independently at home by himself.  One of his daughters lives across the street and the other 1 also in town and both of them check on him regularly.  He was readmitted a week later on December 14 with pneumonia and found to have new onset A-fib.  He was previously on aspirin and Brilinta this was changed to aspirin and Eliquis.  He is tolerating it well without only minor bruising and no bleeding episodes.  He states blood pressure has been running fairly good.  He is has had no recurrent stroke or TIA symptoms.  He remains quite independent and lives here in his own house.  The family has not noticed any memory or cognitive decline since his strokes.  He has no new complaints today. ROS:   14 system review of systems is positive for speech difficulties, gait and balance difficulties all other systems negative  PMH:  Past Medical History:  Diagnosis Date   Bronchitis, chronic (HCC)    COPD (chronic obstructive pulmonary  disease) ([place])    Enlarged prostate    GERD (gastroesophageal reflux disease)    Glaucoma    Glucagonoma    Hernia, incisional    at present   HOH (hard of hearing)    IBS (irritable bowel syndrome)    Sciatic pain    Sinus congestion     Social History:  Social History   Socioeconomic History   Marital status: Married    Spouse name: Charles Golden   Number of children: 3   Years of education: 12+   Highest education level: Bachelor's degree (e.g., BA, AB, BS)   Occupational History   Occupation: Retired    Comment: Immunologist  Tobacco Use   Smoking status: Former    Packs/day: 1.00    Years: 30.00    Pack years: 30.00    Types: Cigarettes    Quit date: 05/28/2006    Years since quitting: 15.1   Smokeless tobacco: Never  Vaping Use   Vaping Use: Never used  Substance and Sexual Activity   Alcohol use: Not Currently    Alcohol/week: 0.0 standard drinks   Drug use: No   Sexual activity: Yes    Birth control/protection: None  Other Topics Concern   Not on file  Social History Narrative   Patient lives at home with [name].    Patient has 3 children.    Patient has his BS   Patient is retired.    Drinks about 2 cups of coffee per day.      [facility]:   He is still married. 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 He is from [place].       Social Determinants of Health   Financial Resource Strain: Not on file  Food Insecurity: Not on file  Transportation Needs: Not on file  Physical Activity: Insufficiently Active   Days of Exercise per Week: 1 day   Minutes of Exercise per Session: 10 min  Stress: Stress Concern Present   Feeling of Stress : To some extent  Social Connections: Not on file  Intimate Partner Violence: Not on file    Medications:   Current Outpatient Medications on File Prior to Visit  Medication Sig Dispense Refill   apixaban (ELIQUIS) 2.5 MG TABS tablet Take 1 tablet (2.5 mg total) by mouth 2 (two) times daily. 180 tablet 1   atorvastatin (LIPITOR) 40 MG tablet Take 1 tablet (40 mg total) by mouth daily. 30 tablet 3   azelastine (ASTELIN) 0.1 % nasal spray PLACE 1 SPRAY IN EACH NOSTRIL ONCE A DAY AS DIRECTED (Patient taking differently: Place 1 spray into both nostrils at bedtime.) 30 mL 4   BREO ELLIPTA 200-25 MCG/INH AEPB Inhale 1 puff into the lungs daily. 60 each 4   Cholecalciferol (VITAMIN D3) 2000 UNITS  TABS Take 1 tablet by mouth daily.     Cyanocobalamin (VITAMIN B-12) 5000 MCG SUBL Place 1 tablet under the tongue daily.     finasteride (PROSCAR) 5 MG tablet Take 5 mg by mouth daily.     fluticasone (FLONASE) 50 MCG/ACT nasal spray Place 2 sprays into both nostrils daily. 16 g 5   guaiFENesin (MUCINEX) 600 MG 12 hr tablet Take 600 mg by mouth daily as needed for to loosen phlegm.     latanoprost (XALATAN) 0.005 % ophthalmic solution Place 1 drop into the left eye at bedtime. (Patient taking differently: Place 1 drop into both  eyes at bedtime.) 2.5 mL 1   pantoprazole (PROTONIX) 40 MG tablet TAKE 1 TABLET DAILY 30 tablet 2   silodosin (RAPAFLO) 8 MG CAPS capsule Take 8 mg by mouth daily.     timolol (TIMOPTIC) 0.5 % ophthalmic solution Place 1 drop into the left eye every morning.     VENTOLIN HFA 108 (90 Base) MCG/ACT inhaler INHALE 2 PUFFS EVERY 6 HOURS AS NEEDED FOR WHEEZING OR SHORTNESS OF BREATH (Patient taking differently: 1 puff every 6 (six) hours as needed for shortness of breath.) 18 g 0   No current facility-administered medications on file prior to visit.    Allergies:   Allergies  Allergen Reactions   Demerol [Meperidine] Other (See Comments)    Pt. States "he woke up during a colonoscopy"   Spiriva Handihaler [Tiotropium Bromide Monohydrate] Other (See Comments)    Mild Urinary Retention    Physical Exam General: well developed, well nourished elderly Caucasian male, seated, in no evident distress Head: head normocephalic and atraumatic.  Neck: supple with no carotid or supraclavicular bruits Cardiovascular: regular rate and rhythm, no murmurs Musculoskeletal: no deformity Skin:  no rash/petichiae Vascular:  Normal pulses all extremities Vitals:   08/03/21 1552  BP: 106/64  Pulse: 66   Neurologic Exam Mental Status: Awake and fully alert. Oriented to place and time. Recent and remote memory intact. Attention span, concentration and fund of knowledge appropriate.  Mood and affect appropriate.  Cranial Nerves: Fundoscopic exam reveals sharp disc margins. Pupils equal, briskly reactive to light. Extraocular movements full without nystagmus. Visual fields full to confrontation. Hearing diminished bilaterally. Facial sensation intact. Face, tongue, palate moves normally and symmetrically.  Motor: Normal bulk and tone. Normal strength in all tested extremity muscles. Sensory.: intact to touch ,pinprick .position and vibratory sensation.  Coordination: Rapid alternating movements normal in all extremities. Finger-to-nose and heel-to-shin performed accurately bilaterally. Gait and Station: Arises from chair without difficulty. Stance is normal. Gait demonstrates normal stride length and balance . Able to heel, toe and tandem walk with moderate difficulty.  Reflexes: 1+ and symmetric. Toes downgoing.   NIHSS  0 Modified Rankin  1   ASSESSMENT: 88 year old Caucasian male with a left MCA branch infarct in December 2022 secondary to high-grade proximal left carotid stenosis with distal embolization to the inferior division of the left M2.  After initial improvement had some neurological worsening and subsequently underwent left carotid revascularization with angioplasty stenting on 05/04/2021.  He was subsequently hospitalized a week later with pneumonia and found to have new onset A-fib.  Vascular risk factors of carotid stenosis, atrial fibrillation, hyperlipidemia and hypertension     PLAN: I had a long d/w patient and his daughter about his recent stroke,carotid stenosis and stenting, new diagnosis of atrial fibrillation, risk for recurrent stroke/TIAs, personally independently reviewed imaging studies and stroke evaluation results and answered questions.Continue aspirin 81 mg daily and Eliquis (apixaban) daily  for secondary stroke prevention  given carotid stent and atrial fibrillation and maintain strict control of hypertension with blood pressure goal below  130/90, diabetes with hemoglobin A1c goal below 6.5% and lipids with LDL cholesterol goal below 70 mg/dL. I also advised the patient to eat a healthy diet with plenty of whole grains, cereals, fruits and vegetables, exercise regularly and maintain ideal body weight . Check carotid ultrasound for stent surveillance.Followup in the future with me in 6 months or call earlier if needed. Greater than 50% of time during this 40 minute visit was spent on counseling,explanation of  diagnosis, planning of further management, discussion with patient and family and coordination of care Antony Contras, MD Note: This document was prepared with digital dictation and possible smart phrase technology. Any transcriptional errors that result from this process are unintentional

## 2021-08-04 ENCOUNTER — Encounter: Payer: Self-pay | Admitting: Family Medicine

## 2021-08-04 ENCOUNTER — Ambulatory Visit (INDEPENDENT_AMBULATORY_CARE_PROVIDER_SITE_OTHER): Payer: Medicare Other | Admitting: Family Medicine

## 2021-08-04 VITALS — BP 137/76 | HR 61 | Temp 97.6°F | Ht 70.0 in | Wt 168.0 lb

## 2021-08-04 DIAGNOSIS — R350 Frequency of micturition: Secondary | ICD-10-CM

## 2021-08-04 LAB — BMP8+EGFR
BUN/Creatinine Ratio: 19 (ref 10–24)
BUN: 29 mg/dL (ref 10–36)
CO2: 22 mmol/L (ref 20–29)
Calcium: 9.2 mg/dL (ref 8.6–10.2)
Chloride: 103 mmol/L (ref 96–106)
Creatinine, Ser: 1.53 mg/dL — ABNORMAL HIGH (ref 0.76–1.27)
Glucose: 94 mg/dL (ref 70–99)
Potassium: 5.1 mmol/L (ref 3.5–5.2)
Sodium: 142 mmol/L (ref 134–144)
eGFR: 42 mL/min/{1.73_m2} — ABNORMAL LOW (ref >59)

## 2021-08-04 LAB — URINALYSIS, ROUTINE W REFLEX MICROSCOPIC
Bilirubin, UA: NEGATIVE
Glucose, UA: NEGATIVE
Ketones, UA: NEGATIVE
Leukocytes,UA: NEGATIVE
Nitrite, UA: NEGATIVE
Protein,UA: NEGATIVE
RBC, UA: NEGATIVE
Specific Gravity, UA: 1.01 (ref 1.005–1.030)
Urobilinogen, Ur: 0.2 mg/dL (ref 0.2–1.0)
pH, UA: 5 (ref 5.0–7.5)

## 2021-08-04 NOTE — Progress Notes (Signed)
? ?Subjective: ?WJ:XBJYN pain ?PCP: [name], DO ?WGN:FAOZH Charles Golden is a 88 y.o. male who is accompanied today by his family member.  He is presenting to clinic today for: ? ?1. Left flank pain ?Patient reports that he has been having some left sided low back pain with some urinary frequency.  He denies any hematuria, dysuria, urgency.  He is treated with Rapaflo for his prostate and Proscar.  He is having some low back pain exacerbation has had this in the past but was worried that perhaps it might be related to his kidneys.  His wife ultimately passed away from complications of kidney failure and this often worries him. ? ? ?ROS: Per HPI ? ?Allergies  ?Allergen Reactions  ? Demerol [Meperidine] Other (See Comments)  ?  Pt. States "he woke up during a colonoscopy"  ? Spiriva Handihaler [Tiotropium Bromide Monohydrate] Other (See Comments)  ?  Mild Urinary Retention  ? ?Past Medical History:  ?Diagnosis Date  ? Bronchitis, chronic ([place])   ? COPD (chronic obstructive pulmonary disease) ([place])   ? Enlarged prostate   ? GERD (gastroesophageal reflux disease)   ? Glaucoma   ? Glucagonoma   ? Hernia, incisional   ? at present  ? HOH (hard of hearing)   ? IBS (irritable bowel syndrome)   ? Sciatic pain   ? Sinus congestion   ? ? ?Current Outpatient Medications:  ?  apixaban (ELIQUIS) 2.5 MG TABS tablet, Take 1 tablet (2.5 mg total) by mouth 2 (two) times daily., Disp: 180 tablet, Rfl: 1 ?  aspirin EC 81 MG tablet, Take 1 tablet (81 mg total) by mouth daily. Swallow whole., Disp: 30 tablet, Rfl: 11 ?  atorvastatin (LIPITOR) 40 MG tablet, Take 1 tablet (40 mg total) by mouth daily., Disp: 30 tablet, Rfl: 3 ?  azelastine (ASTELIN) 0.1 % nasal spray, PLACE 1 SPRAY IN EACH NOSTRIL ONCE A DAY AS DIRECTED (Patient taking differently: Place 1 spray into both nostrils at bedtime.), Disp: 30 mL, Rfl: 4 ?  BREO ELLIPTA 200-25 MCG/INH AEPB, Inhale 1 puff into the lungs daily., Disp: 60 each, Rfl: 4 ?  Casanthranol-Docusate  Sodium (STOOL SOFTENER PLUS PO), Take by mouth., Disp: , Rfl:  ?  Cholecalciferol (VITAMIN D3) 2000 UNITS TABS, Take 1 tablet by mouth daily., Disp: , Rfl:  ?  CRANBERRY PO, Take by mouth., Disp: , Rfl:  ?  Cyanocobalamin (VITAMIN B-12) 5000 MCG SUBL, Place 1 tablet under the tongue daily., Disp: , Rfl:  ?  finasteride (PROSCAR) 5 MG tablet, Take 5 mg by mouth daily., Disp: , Rfl:  ?  fluticasone (FLONASE) 50 MCG/ACT nasal spray, Place 2 sprays into both nostrils daily., Disp: 16 g, Rfl: 5 ?  guaiFENesin (MUCINEX) 600 MG 12 hr tablet, Take 600 mg by mouth daily as needed for to loosen phlegm., Disp: , Rfl:  ?  latanoprost (XALATAN) 0.005 % ophthalmic solution, Place 1 drop into the left eye at bedtime. (Patient taking differently: Place 1 drop into both eyes at bedtime.), Disp: 2.5 mL, Rfl: 1 ?  pantoprazole (PROTONIX) 40 MG tablet, TAKE 1 TABLET DAILY, Disp: 30 tablet, Rfl: 2 ?  silodosin (RAPAFLO) 8 MG CAPS capsule, Take 8 mg by mouth daily., Disp: , Rfl:  ?  timolol (TIMOPTIC) 0.5 % ophthalmic solution, Place 1 drop into the left eye every morning., Disp: , Rfl:  ?  VENTOLIN HFA 108 (90 Base) MCG/ACT inhaler, INHALE 2 PUFFS EVERY 6 HOURS AS NEEDED FOR WHEEZING OR  SHORTNESS OF BREATH (Patient taking differently: 1 puff every 6 (six) hours as needed for shortness of breath.), Disp: 18 g, Rfl: 0 ?Social History  ? ?Socioeconomic History  ? Marital status: Married  ?  Spouse name: [name]  ? Number of children: 3  ? Years of education: 12+  ? Highest education level: Bachelor's degree (e.g., BA, AB, BS)  ?Occupational History  ? 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 Smoking status: Former  ?  Packs/day: 1.00  ?  Years: 30.00  ?  Pack years: 30.00  ?  Types: Cigarettes  ?  Quit date: 05/28/2006  ?  Years since quitting: 15.1  ? Smokeless tobacco: Never  ?Vaping Use  ? Vaping Use: Never used  ?Substance and Sexual Activity  ? Alcohol use: Not Currently  ?  Alcohol/week: 0.0 standard drinks  ?  Drug use: No  ? Sexual activity: Yes  ?  Birth control/protection: None  ?Other Topics Concern  ? Not on file  ?Social History Narrative  ? Patient lives at home with his wife [name].   ? Patient has 3 children.   ? Patient has his BS  ? Patient is retired.   ? Drinks about 2 cups of coffee per day.  ?   ? [place] [facility]:  ? He is still married. 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 He is from [place].   ?   ? ?Social Determinants of Health  ? ?Financial Resource Strain: Not on file  ?Food Insecurity: Not on file  ?Transportation Needs: Not on file  ?Physical Activity: Insufficiently Active  ? Days of Exercise per Week: 1 day  ? Minutes of Exercise per Session: 10 min  ?Stress: Stress Concern Present  ? Feeling of Stress : To some extent  ?Social Connections: Not on file  ?Intimate Partner Violence: Not on file  ? ?Family History  ?Problem Relation Age of Onset  ? Heart disease Mother   ?     Valve replacement and pacemaker  ? Colon cancer Father   ? Asthma Other   ?     cousin  ? Healthy Daughter   ? Healthy Daughter   ? Healthy Daughter   ? ? ?Objective: ?Office vital signs reviewed. ?Ht _0  (1.778 m)   Wt 168 lb (76.2 kg)   BMI 24.11 kg/m?  ? ?Physical Examination:  ?General: Awake, alert, nontoxic male, No acute distress ?MSK: Ambulating independently ? ?Assessment/ Plan: ?88 y.o. male  ? ?Urinary frequency - Plan: Urinalysis, Routine w reflex microscopic, BMP8+EGFR ? ?Urinalysis without evidence of infection.  I am going to go ahead and order renal function test for this gentleman as this is something that does worry him though I have low suspicion for any renal failure in him.  Continue to hydrate well, stay physically active as tolerated ? ?Orders Placed This Encounter  ?Procedures  ? Urinalysis, Routine w reflex microscopic  ? ?No orders of the defined types were placed in this encounter. ? ? ? ?[name],  DO ?[place] ?[phone] ? ?

## 2021-08-08 ENCOUNTER — Telehealth: Payer: Self-pay

## 2021-08-08 NOTE — Chronic Care Management (AMB) (Signed)
?  Care Management  ? ?Note ? ?08/08/2021 ?Name: Charles Golden MRN: [id number] DOB: 1925/12/04 ? ?Charles Golden is a 88 y.o. year old male who is a primary care patient of [name], DO and is actively engaged with the care management team. I reached out to [name] by phone today to assist with re-scheduling a follow up visit with the RN Case Manager ? ?Follow up plan: ?Unsuccessful telephone outreach attempt made. A HIPAA compliant phone message was left for the patient providing contact information and requesting a return call.  ?The care management team will reach out to the patient again over the next 7 days.  ?If patient returns call to provider office, please advise to call [facility]  at [phone] ? ?[name], RMA ?Care Guide, Embedded Care Coordination ?[facility]  ?[place], [place] [address] ?Direct Dial: [phone] ?[email or web address] ?Website: [email or web address]  ? ?

## 2021-08-11 ENCOUNTER — Ambulatory Visit (HOSPITAL_COMMUNITY)
Admission: RE | Admit: 2021-08-11 | Discharge: 2021-08-11 | Disposition: A | Discharge status: Home / Self Care | Payer: Medicare Other | Source: Ambulatory Visit | Attending: Physician Assistant | Admitting: Physician Assistant

## 2021-08-11 ENCOUNTER — Ambulatory Visit (HOSPITAL_COMMUNITY)
Admission: RE | Admit: 2021-08-11 | Discharge: 2021-08-11 | Disposition: A | Discharge status: Home / Self Care | Payer: Medicare Other | Source: Ambulatory Visit | Attending: Neurology | Admitting: Neurology

## 2021-08-11 ENCOUNTER — Other Ambulatory Visit: Payer: Self-pay

## 2021-08-11 ENCOUNTER — Encounter: Payer: Self-pay | Admitting: *Deleted

## 2021-08-11 DIAGNOSIS — I63233 Cerebral infarction due to unspecified occlusion or stenosis of bilateral carotid arteries: Secondary | ICD-10-CM | POA: Diagnosis not present

## 2021-08-11 DIAGNOSIS — I6522 Occlusion and stenosis of left carotid artery: Secondary | ICD-10-CM

## 2021-08-11 DIAGNOSIS — I639 Cerebral infarction, unspecified: Secondary | ICD-10-CM | POA: Diagnosis not present

## 2021-08-11 DIAGNOSIS — Z9889 Other specified postprocedural states: Secondary | ICD-10-CM | POA: Diagnosis not present

## 2021-08-11 NOTE — Progress Notes (Signed)
Carotid duplex has been completed.  ? ?Preliminary results in CV Proc.  ? ?Charles Golden ?08/11/2021 1:16 PM    ?

## 2021-08-14 NOTE — Consult Note (Signed)
? ?Referring Physician(s): ?Watterson,Shannon A ? ?Chief Complaint: ?The patient is seen in follow up today s/p left carotid artery angioplasty and stenting. ? ?History of present illness: ? ?Charles Golden is a 88 year old male with a past medical history significant for GERD, COPD, CKD III and newly diagnosed atrial fibrillation who presented to [facility] ED on 12/1 with complaints of word finding difficulties and unsteady gait. He was found to have a left ICA occlusion and no intervention was recommended as his deficits had resolved. He was discharged on 12/2 with instructions to begin Plavix and ASA, unfortunately he began to experience similar symptoms and returned to the ED on 12/3. He was again admitted and due to the presence of thrombus in the left ICA and resolution of symptoms decision was made to postpone NIR intervention until Plavix and ASA could take effect. Unfortunately he began to experience slurred speech again about 6 hours later and he was brought back to the ED. Code stroke was activated and no acute abnormality was seen on imaging, he was found to be hypotensive for which he was given a fluid bolus and this improved his slurred speech.  He underwent a diagnostic cerebral angiogram with left carotid angioplasty and stenting with use of a cerebral protection device on 05/04/2021.  He comes today for a follow-up to evaluate stent patency and review management strategy. ? ?Carotid duplex obtained on 08/11/2021 showed velocities in the right ICA consistent with a 1-39% stenosis and a patent left carotid stent with <50% stenosis.  ? ? ?Past Medical History:  ?Diagnosis Date  ? Bronchitis, chronic ([place])   ? COPD (chronic obstructive pulmonary disease) ([place])   ? Enlarged prostate   ? GERD (gastroesophageal reflux disease)   ? Glaucoma   ? Glucagonoma   ? Hernia, incisional   ? at present  ? HOH (hard of hearing)   ? IBS (irritable bowel syndrome)   ? Sciatic pain   ? Sinus congestion   ? ? ?Past Surgical  History:  ?Procedure Laterality Date  ? BOWEL RESECTION N/A 09/04/2012  ? Procedure: SMALL BOWEL RESECTION;  Surgeon: Donato Heinz, MD;  Location: [facility];  Service: General;  Laterality: N/A;  Anastimosis  ? HEMORRHOID SURGERY    ? HERNIA REPAIR Bilateral 70's  ? INGUINAL HERNIA REPAIR Left 09/14/2013  ? Procedure: LEFT INGUINAL HERNIORRHAPHY;  Surgeon: Jamesetta So, MD;  Location: [facility];  Service: General;  Laterality: Left;  ? INGUINAL HERNIA REPAIR Right 12/23/2018  ? Procedure: RECURRENT RIGHT INGUINAL HERNIA  REPAIR  WITH MESH;  Surgeon: Aviva Signs, MD;  Location: [facility];  Service: General;  Laterality: Right;  ? INSERTION OF MESH Left 09/14/2013  ? Procedure: INSERTION OF MESH;  Surgeon: Jamesetta So, MD;  Location: [facility];  Service: General;  Laterality: Left;  ? IR ANGIO INTRA EXTRACRAN SEL INTERNAL CAROTID UNI L MOD SED  05/04/2021  ? IR INTRAVSC STENT CERV CAROTID W/EMB-PROT MOD SED INCL ANGIO  05/04/2021  ? IR US GUIDE VASC ACCESS RIGHT  05/04/2021  ? KNEE ARTHROSCOPY Left 07/21/2014  ? Procedure: LEFT KNEE ARTHROSCOPY WITH MEDIAL MENISCAL DEBRIDEMENT ;  Surgeon: Gearlean Alf, MD;  Location: [facility];  Service: Orthopedics;  Laterality: Left;  ? LAPAROTOMY N/A 09/04/2012  ? Procedure: EXPLORATORY LAPAROTOMY;  Surgeon: Donato Heinz, MD;  Location: [facility];  Service: General;  Laterality: N/A;  ? RADIOLOGY WITH ANESTHESIA Left 05/04/2021  ? Procedure: LEFT ICA STENT;  Surgeon: Katherina Right  Lyla Glassing, MD;  Location: [facility];  Service: Radiology;  Laterality: Left;  ? SKIN LESION EXCISION    ? [name]  ? TONSILLECTOMY    ? ? ?Allergies: ?Demerol [meperidine] and Spiriva handihaler [tiotropium bromide monohydrate] ? ?Medications: ?Prior to Admission medications   ?Medication Sig Start Date End Date Taking? Authorizing Provider  ?apixaban (ELIQUIS) 2.5 MG TABS tablet Take 1 tablet (2.5 mg total) by mouth 2 (two) times daily. 06/16/21   [name], DO  ?aspirin EC 81 MG tablet Take 1 tablet  (81 mg total) by mouth daily. Swallow whole. 08/03/21   Garvin Fila, MD  ?atorvastatin (LIPITOR) 40 MG tablet Take 1 tablet (40 mg total) by mouth daily. 04/29/21   Ghimire, Henreitta Leber, MD  ?azelastine (ASTELIN) 0.1 % nasal spray PLACE 1 SPRAY IN EACH NOSTRIL ONCE A DAY AS DIRECTED ?Patient taking differently: Place 1 spray into both nostrils at bedtime. 01/17/21   [name], DO  ?BREO ELLIPTA 200-25 MCG/INH AEPB Inhale 1 puff into the lungs daily. 01/17/21   [name], DO  ?Casanthranol-Docusate Sodium (STOOL SOFTENER PLUS PO) Take by mouth.    [provider]  ?Cholecalciferol (VITAMIN D3) 2000 UNITS TABS Take 1 tablet by mouth daily.    [provider]  ?CRANBERRY PO Take by mouth.    [provider]  ?Cyanocobalamin (VITAMIN B-12) 5000 MCG SUBL Place 1 tablet under the tongue daily.    [provider]  ?finasteride (PROSCAR) 5 MG tablet Take 5 mg by mouth daily.    [provider]  ?fluticasone (FLONASE) 50 MCG/ACT nasal spray Place 2 sprays into both nostrils daily. 06/27/21   [name], DO  ?guaiFENesin (MUCINEX) 600 MG 12 hr tablet Take 600 mg by mouth daily as needed for to loosen phlegm.    [provider]  ?latanoprost (XALATAN) 0.005 % ophthalmic solution Place 1 drop into the left eye at bedtime. ?Patient taking differently: Place 1 drop into both eyes at bedtime. 06/01/16   Chipper Herb, MD  ?pantoprazole (PROTONIX) 40 MG tablet TAKE 1 TABLET DAILY 07/31/21   Ronnie Doss M, DO  ?silodosin (RAPAFLO) 8 MG CAPS capsule Take 8 mg by mouth daily. 06/02/21   [provider]  ?timolol (TIMOPTIC) 0.5 % ophthalmic solution Place 1 drop into the left eye every morning. 11/24/18   [provider]  ?VENTOLIN HFA 108 (90 Base) MCG/ACT inhaler INHALE 2 PUFFS EVERY 6 HOURS AS NEEDED FOR WHEEZING OR SHORTNESS OF BREATH ?Patient taking differently: 1 puff every 6 (six) hours as needed for shortness of breath. 02/28/21    [name], DO  ?  ? ?Family History  ?Problem Relation Age of Onset  ? Heart disease Mother   ?     Valve replacement and pacemaker  ? Colon cancer Father   ? Asthma Other   ?     cousin  ? Healthy Daughter   ? Healthy Daughter   ? Healthy Daughter   ? ? ?Social History  ? ?Socioeconomic History  ? Marital status: Married  ?  Spouse name: [name]  ? Number of children: 3  ? Years of education: 12+  ? Highest education level: Bachelor's degree (e.g., BA, AB, BS)  ?Occupational History  ? 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 Smoking status: Former  ?  Packs/day: 1.00  ?  Years: 30.00  ?  Pack years: 30.00  ?  Types: Cigarettes  ?  Quit date: 05/28/2006  ?  Years since quitting: 15.2  ? Smokeless tobacco: Never  ?Vaping Use  ? Vaping Use: Never used  ?Substance and Sexual Activity  ? Alcohol use: Not Currently  ?  Alcohol/week: 0.0 standard drinks  ? Drug use: No  ? Sexual activity: Yes  ?  Birth control/protection: None  ?Other Topics Concern  ? Not on file  ?Social History Narrative  ? Patient lives at home with his wife [name].   ? Patient has 3 children.   ? Patient has his BS  ? Patient is retired.   ? Drinks about 2 cups of coffee per day.  ?   ? [place] [facility]:  ? He is still married. 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 He is from [place].   ?   ? ?Social Determinants of Health  ? ?Financial Resource Strain: Not on file  ?Food Insecurity: Not on file  ?Transportation Needs: Not on file  ?Physical Activity: Insufficiently Active  ? Days of Exercise per Week: 1 day  ? Minutes of Exercise per Session: 10 min  ?Stress: Stress Concern Present  ? Feeling of Stress : To some extent  ?Social Connections: Not on file  ? ? ? ?Vital Signs: ?There were no vitals taken for this visit. ? ?Physical Exam ?Constitutional:   ?   Appearance: Normal appearance.  ?HENT:  ?   Head: Normocephalic.  ?Eyes:  ?    Extraocular Movements: Extraocular movements intact.  ?   Conjunctiva/sclera: Conjunctivae normal.  ?   Pupils: Pupils are equal, round, and reactive to light.  ?Neurological:  ?   Mental Status: He is alert and

## 2021-08-25 DIAGNOSIS — I4891 Unspecified atrial fibrillation: Secondary | ICD-10-CM

## 2021-08-25 DIAGNOSIS — J449 Chronic obstructive pulmonary disease, unspecified: Secondary | ICD-10-CM

## 2021-08-30 ENCOUNTER — Other Ambulatory Visit: Payer: Self-pay | Admitting: Family Medicine

## 2021-09-01 ENCOUNTER — Telehealth: Payer: Medicare Other

## 2021-09-04 ENCOUNTER — Other Ambulatory Visit: Payer: Self-pay | Admitting: Family Medicine

## 2021-09-11 ENCOUNTER — Telehealth: Payer: Medicare Other

## 2021-09-11 ENCOUNTER — Telehealth: Payer: Self-pay | Admitting: Licensed Clinical Social Worker

## 2021-09-11 NOTE — Telephone Encounter (Signed)
?  Chronic Care Management  ?  ? Clinical Social Work Note ?  ?09/11/21 ?Name: Charles Golden  MRN: [id number]       DOB: 1925-10-16 ?  ?Charles Golden is a 88 y.o. year old male who is a primary care patient of [name], DO. The CCM team was consulted to assist the patient with chronic disease management and/or care coordination needs related to: [employer]  ? ?LCSW called daughter of client, Ernst Spell, 2 times today at  Caroline's phone number but LCSW was not able to speak with Chrys Racer. LCSW did leave phone message for Chrys Racer asking her to please call LCSW at [phone]. [phone] ? ?Follow Up Plan;  LCSW to call client or daughter, Ernst Spell on 10/31/21 at 10:00 AM ? ?Norva Riffle.Zamiya Dillard MSW, LCSW ?Licensed Clinical Social Worker ?[facility] ?[phone] ?

## 2021-09-15 ENCOUNTER — Encounter: Payer: Self-pay | Admitting: Family

## 2021-09-15 ENCOUNTER — Ambulatory Visit (INDEPENDENT_AMBULATORY_CARE_PROVIDER_SITE_OTHER): Payer: Medicare Other | Admitting: Family

## 2021-09-15 VITALS — BP 132/71 | HR 81 | Temp 97.7°F | Ht 70.0 in | Wt 166.0 lb

## 2021-09-15 DIAGNOSIS — I6930 Unspecified sequelae of cerebral infarction: Secondary | ICD-10-CM | POA: Diagnosis not present

## 2021-09-15 DIAGNOSIS — J441 Chronic obstructive pulmonary disease with (acute) exacerbation: Secondary | ICD-10-CM

## 2021-09-15 MED ORDER — PREDNISONE 20 MG PO TABS: 40.0000 mg | ORAL_TABLET | Freq: Every day | ORAL | 0 refills | Status: AC

## 2021-09-15 MED ORDER — DOXYCYCLINE HYCLATE 100 MG PO TABS: 100.0000 mg | ORAL_TABLET | Freq: Two times a day (BID) | ORAL | 0 refills | Status: DC

## 2021-09-15 NOTE — Progress Notes (Signed)
? ?Subjective:  ? ? Patient ID: Charles Golden, male    DOB: Dec 22, 1925, 88 y.o.   MRN: [id number] ? ?Chief Complaint  ?Patient presents with  ? Cough  ? Nasal Congestion  ?  Been over over a week. Denies fever   ? ? ?Cough ?This is a new problem. The current episode started 1 to 4 weeks ago. The problem has been gradually worsening. The problem occurs every few minutes. The cough is Productive of sputum. Associated symptoms include headaches, nasal congestion, shortness of breath and wheezing. Pertinent negatives include no chills, ear congestion, ear pain, fever, myalgias or sore throat. He has tried rest and OTC cough suppressant (zyrtec) for the symptoms. The treatment provided moderate relief.  ? ? ? ?Review of Systems  ?Constitutional:  Negative for chills and fever.  ?HENT:  Negative for ear pain and sore throat.   ?Respiratory:  Positive for cough, shortness of breath and wheezing.   ?Musculoskeletal:  Negative for myalgias.  ?Neurological:  Positive for headaches.  ?All other systems reviewed and are negative. ? ?   ?Objective:  ? Physical Exam ?Vitals reviewed.  ?Constitutional:   ?   General: He is not in acute distress. ?   Appearance: He is well-developed.  ?HENT:  ?   Head: Normocephalic.  ?   Right Ear: There is impacted cerumen.  ?   Left Ear: There is impacted cerumen.  ?Eyes:  ?   General:     ?   Right eye: No discharge.     ?   Left eye: No discharge.  ?   Pupils: Pupils are equal, round, and reactive to light.  ?Neck:  ?   Thyroid: No thyromegaly.  ?Cardiovascular:  ?   Rate and Rhythm: Normal rate and regular rhythm.  ?   Heart sounds: Normal heart sounds. No murmur heard. ?Pulmonary:  ?   Effort: Pulmonary effort is normal. No respiratory distress.  ?   Breath sounds: Rhonchi present. No wheezing.  ?Abdominal:  ?   General: Bowel sounds are normal. There is no distension.  ?   Palpations: Abdomen is soft.  ?   Tenderness: There is no abdominal tenderness.  ?Musculoskeletal:     ?   General: No  tenderness. Normal range of motion.  ?   Cervical back: Normal range of motion and neck supple.  ?Skin: ?   General: Skin is warm and dry.  ?   Findings: No erythema or rash.  ?Neurological:  ?   Mental Status: He is alert and oriented to person, place, and time.  ?   Cranial Nerves: No cranial nerve deficit.  ?   Deep Tendon Reflexes: Reflexes are normal and symmetric.  ?Psychiatric:     ?   Behavior: Behavior normal.     ?   Thought Content: Thought content normal.     ?   Judgment: Judgment normal.  ? ? ?BP 132/71   Pulse 81   Temp 97.7 ?F (36.5 ?C) (Temporal)   Ht '5\' 10"'$  (1.778 m)   Wt 166 lb (75.3 kg)   SpO2 97%   BMI 23.82 kg/m?  ? ? ? ?   ?Assessment & Plan:  ?ISHAQ MAFFEI comes in today with chief complaint of Cough and Nasal Congestion (Been over over a week. Denies fever ) ? ? ?Diagnosis and orders addressed: ? ?1. COPD exacerbation ([place]) ?- Take meds as prescribed ?- Use a cool mist humidifier  ?-Use saline nose sprays  frequently ?-Force fluids ?-For any cough or congestion ? Use plain Mucinex- regular strength or max strength is fine ?-For fever or aces or pains- take tylenol or ibuprofen. ?-Throat lozenges if help ?-Follow up if symptoms worsen or do not improve  ?- doxycycline (VIBRA-TABS) 100 MG tablet; Take 1 tablet (100 mg total) by mouth 2 (two) times daily.  Dispense: 20 tablet; Refill: 0 ?- predniSONE (DELTASONE) 20 MG tablet; Take 2 tablets (40 mg total) by mouth daily with breakfast for 5 days.  Dispense: 10 tablet; Refill: 0 ? ?Evelina Dun, FNP ? ? ?

## 2021-09-15 NOTE — Patient Instructions (Signed)
Acute Bronchitis, Adult ? ?Acute bronchitis is sudden inflammation of the main airways (bronchi) that come off the windpipe (trachea) in the lungs. The swelling causes the airways to get smaller and make more mucus than normal. This can make it hard to breathe and can cause coughing or noisy breathing (wheezing). ?Acute bronchitis may last several weeks. The cough may last longer. Allergies, asthma, and exposure to smoke may make the condition worse. ?What are the causes? ?This condition can be caused by germs and by substances that irritate the lungs, including: ?Cold and flu viruses. The most common cause of this condition is the virus that causes the common cold. ?Bacteria. This is less common. ?Breathing in substances that irritate the lungs, including: ?Smoke from cigarettes and other forms of tobacco. ?Dust and pollen. ?Fumes from household cleaning products, gases, or burned fuel. ?Indoor or outdoor air pollution. ?What increases the risk? ?The following factors may make you more likely to develop this condition: ?A weak body's defense system, also called the immune system. ?A condition that affects your lungs and breathing, such as asthma. ?What are the signs or symptoms? ?Common symptoms of this condition include: ?Coughing. This may bring up clear, yellow, or green mucus from your lungs (sputum). ?Wheezing. ?Runny or stuffy nose. ?Having too much mucus in your lungs (chest congestion). ?Shortness of breath. ?Aches and pains, including sore throat or chest. ?How is this diagnosed? ?This condition is usually diagnosed based on: ?Your symptoms and medical history. ?A physical exam. ?You may also have other tests, including tests to rule out other conditions, such as pneumonia. These tests include: ?A test of lung function. ?Test of a mucus sample to look for the presence of bacteria. ?Tests to check the oxygen level in your blood. ?Blood tests. ?Chest X-ray. ?How is this treated? ?Most cases of acute  bronchitis clear up over time without treatment. Your health care provider may recommend: ?Drinking more fluids to help thin your mucus so it is easier to cough up. ?Taking inhaled medicine (inhaler) to improve air flow in and out of your lungs. ?Using a vaporizer or a humidifier. These are machines that add water to the air to help you breathe better. ?Taking a medicine that thins mucus and clears congestion (expectorant). ?Taking a medicine that prevents or stops coughing (cough suppressant). ?It is notcommon to take an antibiotic medicine for this condition. ?Follow these instructions at home: ? ?Take over-the-counter and prescription medicines only as told by your health care provider. ?Use an inhaler, vaporizer, or humidifier as told by your health care provider. ?Take two teaspoons (10 mL) of honey at bedtime to lessen coughing at night. ?Drink enough fluid to keep your urine pale yellow. ?Do not use any products that contain nicotine or tobacco. These products include cigarettes, chewing tobacco, and vaping devices, such as e-cigarettes. If you need help quitting, ask your health care provider. ?Get plenty of rest. ?Return to your normal activities as told by your health care provider. Ask your health care provider what activities are safe for you. ?Keep all follow-up visits. This is important. ?How is this prevented? ?To lower your risk of getting this condition again: ?Wash your hands often with soap and water for at least 20 seconds. If soap and water are not available, use hand sanitizer. ?Avoid contact with people who have cold symptoms. ?Try not to touch your mouth, nose, or eyes with your hands. ?Avoid breathing in smoke or chemical fumes. Breathing smoke or chemical fumes will make your   condition worse. ?Get the flu shot every year. ?Contact a health care provider if: ?Your symptoms do not improve after 2 weeks. ?You have trouble coughing up the mucus. ?Your cough keeps you awake at night. ?You have a  fever. ?Get help right away if you: ?Cough up blood. ?Feel pain in your chest. ?Have severe shortness of breath. ?Faint or keep feeling like you are going to faint. ?Have a severe headache. ?Have a fever or chills that get worse. ?These symptoms may represent a serious problem that is an emergency. Do not wait to see if the symptoms will go away. Get medical help right away. Call your local emergency services (911 in the [place] Do not drive yourself to the hospital. ?Summary ?Acute bronchitis is inflammation of the main airways (bronchi) that come off the windpipe (trachea) in the lungs. The swelling causes the airways to get smaller and make more mucus than normal. ?Drinking more fluids can help thin your mucus so it is easier to cough up. ?Take over-the-counter and prescription medicines only as told by your health care provider. ?Do not use any products that contain nicotine or tobacco. These products include cigarettes, chewing tobacco, and vaping devices, such as e-cigarettes. If you need help quitting, ask your health care provider. ?Contact a health care provider if your symptoms do not improve after 2 weeks. ?This information is not intended to replace advice given to you by your health care provider. Make sure you discuss any questions you have with your health care provider. ?Document Revised: 09/14/2020 Document Reviewed: 09/14/2020 ?Elsevier Patient Education ? 2023 Elsevier Inc. ? ?

## 2021-09-21 NOTE — Chronic Care Management (AMB) (Signed)
?  Care Management  ? ?Note ? ?09/21/2021 ?Name: Charles Golden MRN: [id number] DOB: 09/06/1925 ? ?Charles Golden is a 88 y.o. year old male who is a primary care patient of [name], DO and is actively engaged with the care management team. I reached out to [name] by phone today to assist with re-scheduling a follow up visit with the RN Case Manager ? ?Follow up plan: ?Unsuccessful telephone outreach attempt made. A HIPAA compliant phone message was left for the patient providing contact information and requesting a return call.  ?The care management team will reach out to the patient again over the next 7 days.  ?If patient returns call to provider office, please advise to call [facility]  at [phone] ? ?[name], RMA ?Care Guide, Embedded Care Coordination ?[facility]  ?[place], [place] [address] ?Direct Dial: [phone] ?Museum/gallery conservator.[email or web address] ?Website: [email or web address]  ? ?

## 2021-09-29 ENCOUNTER — Ambulatory Visit: Payer: Medicare Other | Admitting: Pulmonary Disease

## 2021-09-29 ENCOUNTER — Encounter: Payer: Self-pay | Admitting: Pulmonary Disease

## 2021-09-29 ENCOUNTER — Ambulatory Visit (INDEPENDENT_AMBULATORY_CARE_PROVIDER_SITE_OTHER): Payer: Medicare Other | Admitting: Pulmonary Disease

## 2021-09-29 DIAGNOSIS — J449 Chronic obstructive pulmonary disease, unspecified: Secondary | ICD-10-CM

## 2021-09-29 DIAGNOSIS — I639 Cerebral infarction, unspecified: Secondary | ICD-10-CM | POA: Diagnosis not present

## 2021-09-29 MED ORDER — VENTOLIN HFA 108 (90 BASE) MCG/ACT IN AERS: 2.0000 | INHALATION_SPRAY | Freq: Four times a day (QID) | RESPIRATORY_TRACT | 5 refills | Status: DC | PRN

## 2021-09-29 MED ORDER — GUAIFENESIN ER 600 MG PO TB12: 1200.0000 mg | ORAL_TABLET | Freq: Two times a day (BID) | ORAL | Status: DC | PRN

## 2021-09-29 NOTE — Patient Instructions (Signed)
Albuterol two puffs twice per day until cough and chest congestion are better, then as needed ? ?Flutter valve twice per day until cough and chest congestion are better, then as needed ? ?Mucinex 1200 mg twice per day until cough and chest congestion are better, then as needed ? ?Follow up in 4 months ?

## 2021-09-29 NOTE — Progress Notes (Signed)
? ?[facility] ? ?Chief Complaint  ?Patient presents with  ? Follow-up  ?  F/U on bronchiectasis. States he had a COPD flare up back in April. Still has a productive cough with yellow phlegm. Finished doxy and prednisone.   ? ? ?Constitutional:  ?BP 118/70   Pulse 61   Ht '5\' 10"'$  (1.778 m)   Wt 161 lb (73 kg)   SpO2 95% Comment: on RA  BMI 23.10 kg/m?  ? ?Past Medical History:  ?Sciatica, IBS, Glaucoma, Glucagonoma, GERD, BPH ? ?Past Surgical History:  ?He  has a past surgical history that includes Hemorrhoid surgery; laparotomy (N/A, 09/04/2012); Bowel resection (N/A, 09/04/2012); Inguinal hernia repair (Left, 09/14/2013); Insertion of mesh (Left, 09/14/2013); Tonsillectomy; Knee arthroscopy (Left, 07/21/2014); Skin lesion excision; Hernia repair (Bilateral, 70's); Inguinal hernia repair (Right, 12/23/2018); Radiology with anesthesia (Left, 05/04/2021); IR US Guide Vasc Access Right (05/04/2021); IR INTRAVSC STENT CERV CAROTID W/EMB-PROT MOD SED (05/04/2021); and IR ANGIO INTRA EXTRACRAN SEL INTERNAL CAROTID UNI L MOD SED (05/04/2021). ? ?Brief Summary:  ?Charles Golden is a 88 y.o. male former smoker with BTX, COPD/emphysema and GERD. ?  ? ? ? ?Subjective:  ? ?He is here with his daughter. ? ?Since his last visit his wife passed away and then he had a stroke. ? ?In April he developed worsening cough.  Started on ABx and prednisone from PCP and feels better.  Still has cough but now sputum is clear.  He has been getting pain in his abdomen when he lays flat, but okay when he sits or stands up. ? ?Physical Exam:  ? ?Appearance - well kempt  ? ?ENMT - no sinus tenderness, no oral exudate, no LAN, Mallampati 3 airway, no stridor ? ?Respiratory - scattered rhonchi that clear with coughing ? ?CV - s1s2 regular rate and rhythm, no murmurs ? ?Ext - no clubbing, no edema ? ?Skin - no rashes ? ?Psych - normal mood and affect ? ? ?  ?Pulmonary testing:  ?A1AT 04/11/15 >> 153, MM ?PFT  07/13/15 >> FEV1 2.40 (92%), FEV1% 66, TLC 8.07 (112%), DLCO 75%, +BD ? ?Chest Imaging:  ?CT chest 04/19/15 >> mild cylindrical BTX in bases, mild centrilobular emphysema, calcified mediastinal LN ?HRCT chest 03/14/21 >> diffuse bronchial thickening, scarring at bases ? ?Sleep Tests:  ?PSG 11/15/16 >> AHI 3.6 ? ?Cardiac Tests:  ?Echo 04/28/21 >> EF 60 to 65%, grade 2 DD, mild/mod RV dilation ? ?Social History:  ?He  reports that he quit smoking about 15 years ago. His smoking use included cigarettes. He has a 30.00 pack-year smoking history. He has never used smokeless tobacco. He reports that he does not currently use alcohol. He reports that he does not use drugs. ? ?Family History:  ?His family history includes Asthma in an other family member; Colon cancer in his father; Healthy in his daughter, daughter, and daughter; Heart disease in his mother. ?  ? ? ?Assessment/Plan:  ? ?Bronchiectasis. ?- prn albuterol, flutter valve, mucinex ? ?COPD with chronic bronchitis and emphysema. ?- continue breo ?  ?Allergic rhinitis. ?- prn azelastine, flonase ? ?Abdominal pain. ?- follow up with PCP ? ?Time Spent Involved in Patient Care on Day of Examination:  ?35 minutes ? ?Follow up:  ? ?Patient Instructions  ?Albuterol two puffs twice per day until cough and chest congestion are better, then as needed ? ?Flutter valve twice per day until cough and chest congestion are better, then as needed ? ?Mucinex 1200 mg  twice per day until cough and chest congestion are better, then as needed ? ?Follow up in 4 months ? ?Medication List:  ? ?Allergies as of 09/29/2021   ? ?   Reactions  ? Demerol [meperidine] Other (See Comments)  ? Pt. States "he woke up during a colonoscopy"  ? Spiriva Handihaler [tiotropium Bromide Monohydrate] Other (See Comments)  ? Mild Urinary Retention  ? ?  ? ?  ?Medication List  ?  ? ?  ? Accurate as of Sep 29, 2021 12:41 PM. If you have any questions, ask your nurse or doctor.  ?  ?  ? ?  ? ?STOP taking these  medications   ? ?doxycycline 100 MG tablet ?Commonly known as: VIBRA-TABS ?Stopped by: [name], MD ?  ? ?  ? ?TAKE these medications   ? ?apixaban 2.5 MG Tabs tablet ?Commonly known as: ELIQUIS ?Take 1 tablet (2.5 mg total) by mouth 2 (two) times daily. ?  ?aspirin EC 81 MG tablet ?Take 1 tablet (81 mg total) by mouth daily. Swallow whole. ?  ?atorvastatin 40 MG tablet ?Commonly known as: LIPITOR ?TAKE 1 TABLET DAILY ?  ?azelastine 0.1 % nasal spray ?Commonly known as: ASTELIN ?PLACE 1 SPRAY IN EACH NOSTRIL ONCE A DAY AS DIRECTED ?What changed:  ?how much to take ?how to take this ?when to take this ?additional instructions ?  ?Breo Ellipta 200-25 MCG/ACT Aepb ?Generic drug: fluticasone furoate-vilanterol ?Inhale 1 puff into the lungs daily. ?  ?CRANBERRY PO ?Take by mouth. ?  ?finasteride 5 MG tablet ?Commonly known as: PROSCAR ?Take 5 mg by mouth daily. ?  ?fluticasone 50 MCG/ACT nasal spray ?Commonly known as: FLONASE ?Place 2 sprays into both nostrils daily. ?  ?guaiFENesin 600 MG 12 hr tablet ?Commonly known as: [place] ?Take 2 tablets (1,200 mg total) by mouth 2 (two) times daily as needed for to loosen phlegm or cough. ?What changed:  ?how much to take ?when to take this ?reasons to take this ?Changed by: [name], MD ?  ?latanoprost 0.005 % ophthalmic solution ?Commonly known as: XALATAN ?Place 1 drop into the left eye at bedtime. ?What changed: how to take this ?  ?pantoprazole 40 MG tablet ?Commonly known as: PROTONIX ?TAKE 1 TABLET DAILY ?  ?silodosin 8 MG Caps capsule ?Commonly known as: RAPAFLO ?Take 8 mg by mouth daily. ?  ?STOOL SOFTENER PLUS PO ?Take by mouth as needed. ?  ?timolol 0.5 % ophthalmic solution ?Commonly known as: TIMOPTIC ?Place 1 drop into the left eye every morning. ?  ?Ventolin HFA 108 (90 Base) MCG/ACT inhaler ?Generic drug: albuterol ?Inhale 2 puffs into the lungs every 6 (six) hours as needed for wheezing or shortness of breath. ?What changed: See the new  instructions. ?Changed by: [name], MD ?  ?Vitamin B-12 5000 MCG Subl ?Place 1 tablet under the tongue daily. ?  ?Vitamin D3 50 MCG (2000 UT) Tabs ?Take 1 tablet by mouth daily. ?  ? ?  ? ? ?Signature:  ?[name], MD ?[facility] ?Pager - [phone] ?09/29/2021, 12:41 PM ?  ? ? ? ? ? ? ? ? ?

## 2021-10-02 ENCOUNTER — Other Ambulatory Visit: Payer: Self-pay | Admitting: Family Medicine

## 2021-10-03 NOTE — Chronic Care Management (AMB) (Signed)
?  Chronic Care Management ?Note ? ?10/03/2021 ?Name: [name] MRN: [id number] DOB: [birth year] ? ?Charles Golden is a 88 y.o. year old male who is a primary care patient of [name], DO and is actively engaged with the care management team. I reached out to [name] by phone today to assist with re-scheduling a follow up visit with the RN Case Manager ? ?Follow up plan: ?Telephone appointment with care management team member scheduled for:10/24/2021 ? ?[name], RMA ?Care Guide, Embedded Care Coordination ?[facility]  ?[place], [place] [address] ?Direct Dial: [phone] ?Museum/gallery conservator.[email or web address] ?Website: [email or web address]  ? ?

## 2021-10-24 ENCOUNTER — Ambulatory Visit: Payer: Medicare Other | Admitting: *Deleted

## 2021-10-24 ENCOUNTER — Encounter: Payer: Self-pay | Admitting: *Deleted

## 2021-10-24 DIAGNOSIS — I4891 Unspecified atrial fibrillation: Secondary | ICD-10-CM

## 2021-10-24 NOTE — Chronic Care Management (AMB) (Signed)
Care Management    RN Visit Note  10/24/2021 Name: Charles Golden MRN: [id number] DOB: [birth year]  Subjective: Charles Golden is a 88 y.o. year old male who is a primary care patient of Charles Norlander, DO. The care management team was consulted for assistance with disease management and care coordination needs.    Engaged with patient by telephone for follow up visit in response to provider referral for case management and/or care coordination services.   Consent to Services:   [name] was given information about Care Management services today including:  Care Management services includes personalized support from designated clinical staff supervised by his physician, including individualized plan of care and coordination with other care providers 24/7 contact phone numbers for assistance for urgent and routine care needs. The patient may stop case management services at any time by phone call to the office staff.  Patient agreed to services and consent obtained.   Assessment: Review of patient past medical history, allergies, medications, health status, including review of consultants reports, laboratory and other test data, was performed as part of comprehensive evaluation and provision of chronic care management services.   SDOH (Social Determinants of Health) assessments and interventions performed:    Care Plan  Allergies  Allergen Reactions   Demerol [Meperidine] Other (See Comments)    Pt. States "he woke up during a colonoscopy"   Spiriva Handihaler [Tiotropium Bromide Monohydrate] Other (See Comments)    Mild Urinary Retention    Outpatient Encounter Medications as of 10/24/2021  Medication Sig   apixaban (ELIQUIS) 2.5 MG TABS tablet Take 1 tablet (2.5 mg total) by mouth 2 (two) times daily.   aspirin EC 81 MG tablet Take 1 tablet (81 mg total) by mouth daily. Swallow whole.   atorvastatin (LIPITOR) 40 MG tablet TAKE 1 TABLET DAILY   azelastine (ASTELIN) 0.1 %  nasal spray PLACE 1 SPRAY IN EACH NOSTRIL ONCE A DAY AS DIRECTED   BREO ELLIPTA 200-25 MCG/ACT AEPB Inhale 1 puff into the lungs daily.   Casanthranol-Docusate Sodium (STOOL SOFTENER PLUS PO) Take by mouth as needed.   Cholecalciferol (VITAMIN D3) 2000 UNITS TABS Take 1 tablet by mouth daily.   CRANBERRY PO Take by mouth.   Cyanocobalamin (VITAMIN B-12) 5000 MCG SUBL Place 1 tablet under the tongue daily.   finasteride (PROSCAR) 5 MG tablet Take 5 mg by mouth daily.   fluticasone (FLONASE) 50 MCG/ACT nasal spray Place 2 sprays into both nostrils daily.   guaiFENesin (MUCINEX) 600 MG 12 hr tablet Take 2 tablets (1,200 mg total) by mouth 2 (two) times daily as needed for to loosen phlegm or cough.   latanoprost (XALATAN) 0.005 % ophthalmic solution Place 1 drop into the left eye at bedtime. (Patient taking differently: Place 1 drop into both eyes at bedtime.)   pantoprazole (PROTONIX) 40 MG tablet TAKE 1 TABLET DAILY   silodosin (RAPAFLO) 8 MG CAPS capsule Take 8 mg by mouth daily.   timolol (TIMOPTIC) 0.5 % ophthalmic solution Place 1 drop into the left eye every morning.   VENTOLIN HFA 108 (90 Base) MCG/ACT inhaler Inhale 2 puffs into the lungs every 6 (six) hours as needed for wheezing or shortness of breath.   No facility-administered encounter medications on file as of 10/24/2021.    Patient Active Problem List   Diagnosis Date Noted   Sepsis secondary to UTI ([place]) 05/10/2021   Acute renal failure superimposed on stage 3b chronic kidney disease ([place]) 05/10/2021   Atrial  fibrillation with RVR ([place]) 05/10/2021   Skin infection 05/08/2021   Protein-calorie malnutrition, severe 05/05/2021   CVA (cerebral vascular accident) ([place]) 05/01/2021   Stenosis of left carotid artery    Acute ischemic stroke ([place]) 04/29/2021   TIA (transient ischemic attack) 04/27/2021   Chronic tension-type headache, not intractable 12/13/2020   Bilateral sensorineural hearing loss 09/25/2019   Bronchiectasis  without complication ([place]) 91/79/1505   Fatigue 05/27/2019   Vitamin D deficiency 01/21/2019   Weakness 01/21/2019   Recurrent right inguinal hernia    Presbycusis of both ears 07/19/2017   Restless leg syndrome 11/25/2016   Collagenous colitis 07/24/2016   Deviated nasal septum 09/03/2015   Basal cell carcinoma of nose 09/03/2015   Thoracic aortic atherosclerosis ([place]) 05/03/2015   Upper airway cough syndrome 04/28/2015   GERD without esophagitis 04/11/2015   Acute medial meniscal tear 07/20/2014   COPD GOLD I  12/08/2013   Small bowel volvulus ([place]) 08/05/2013   Chronic headache 08/05/2013   Left inguinal hernia 08/05/2013   Incisional hernia, without obstruction or gangrene 08/05/2013   Headache 05/01/2013   Obstructive chronic bronchitis without exacerbation ([place]) 04/30/2013   Left hip pain 04/29/2013   BPH (benign prostatic hyperplasia) 01/05/2013   IBS (irritable bowel syndrome) 09/03/2012   Chronic rhinosinusitis 09/03/2012   Lung nodules 05/04/2003    Conditions to be addressed/monitored: Atrial Fibrillation and abdominal pain  Care Plan : [facility] Care Plan  Updates made by Charles China, RN since 10/24/2021 12:00 AM     Problem: Chronic Disease Management Needs   Priority: High  Onset Date: 08/01/2021     Long-Range Goal: Patient will Work with RN Care Manager Regarding Care Management and Care Coordination Associated with CVA, BPH, COPD, CKD, A Fib   Start Date: 08/01/2021  Expected End Date: 08/02/2022  This Visit's Progress: On track  Recent Progress: On track  Priority: High  Note:   Current Barriers:  Chronic Disease Management support and education needs related to CVA, BPH, COPD, CKD, A Fib Advanced age  RNCM Clinical Goal(s):  Patient will continue to work with RN Care Manager and/or Social Worker to address care management and care coordination needs related to CVA, BPH, COPD, CKD, A Fib as evidenced by adherence to CM Team Scheduled appointments      through collaboration with LCSW, provider, and care team.   Interventions: 1:1 collaboration with primary care provider regarding development and update of comprehensive plan of care as evidenced by provider attestation and co-signature Inter-disciplinary care team collaboration (see longitudinal plan of care) Evaluation of current treatment plan related to  self management and patient's adherence to plan as established by provider Assessed family/social support Discussed current intermittent abdominal pain. Pain is mostly at night. Per patient, has history of abdominal hernia. Collaborated with [facility] Clinical Staff to schedule an appt with PCP on 10/25/21 at 10:00   A-fib:  (Status: Condition stable. Not addressed this visit.) Long Term Goal  Reviewed importance of adherence to anticoagulant exactly as prescribed Counseled on bleeding risk associated with Eliquis and importance of self-monitoring for signs/symptoms of bleeding Counseled on seeking medical attention after a head injury or if there is blood in the urine/stool Assessed social determinant of health barriers   Patient Goals/Self-Care Activities: Take medications as prescribed   Attend all scheduled provider appointments Perform IADL's (shopping, preparing meals, housekeeping, managing finances) independently Call provider office for new concerns or questions  Call RN Care Manager as needed [phone] Seek appropriate medical attention for  any new or worsening symptoms Come to appointment with Dr [name] tomorrow at 10:00 am for abdominal pain  Plan: Telephone follow up appointment with care management team member scheduled for:  11/22/21 at 12:45 with [facility] The patient has been provided with contact information for the care management team and has been advised to call with any health related questions or concerns.   Chong Sicilian, BSN, RN-BC Embedded Chronic Care Manager [facility]  [facility] Direct Dial: [phone]

## 2021-10-24 NOTE — Patient Instructions (Signed)
Visit Information  Thank you for taking time to visit with me today. Please don't hesitate to contact me if I can be of assistance to you before our next scheduled telephone appointment.  Following are the goals we discussed today:  Patient Goals/Self-Care Activities: Take medications as prescribed   Attend all scheduled provider appointments Perform IADL's (shopping, preparing meals, housekeeping, managing finances) independently Call provider office for new concerns or questions  Call RN Care Manager as needed [phone] Seek appropriate medical attention for any new or worsening symptoms Come to appointment with Dr [name] tomorrow at 10:00 am for abdominal pain  Our next appointment is by telephone on 11/22/21 at 12:45pm  Please call the care guide team at [phone] if you need to cancel or reschedule your appointment.   If you are experiencing a Mental Health or [place] or need someone to talk to, please call the [facility]: [phone] call 911   Patient verbalizes understanding of instructions and care plan provided today and agrees to view in [place] [place]. Active MyChart status and patient understanding of how to access instructions and care plan via MyChart confirmed with patient.     Chong Sicilian, BSN, RN-BC Embedded Chronic Care Manager [facility] Direct Dial: [phone]

## 2021-10-25 ENCOUNTER — Ambulatory Visit (INDEPENDENT_AMBULATORY_CARE_PROVIDER_SITE_OTHER): Payer: Medicare Other | Admitting: Family Medicine

## 2021-10-25 ENCOUNTER — Encounter: Payer: Self-pay | Admitting: Family Medicine

## 2021-10-25 VITALS — BP 149/83 | HR 61 | Temp 97.4°F | Resp 20 | Ht 70.0 in | Wt 166.0 lb

## 2021-10-25 DIAGNOSIS — K432 Incisional hernia without obstruction or gangrene: Secondary | ICD-10-CM | POA: Diagnosis not present

## 2021-10-25 DIAGNOSIS — R198 Other specified symptoms and signs involving the digestive system and abdomen: Secondary | ICD-10-CM | POA: Diagnosis not present

## 2021-10-25 DIAGNOSIS — R1084 Generalized abdominal pain: Secondary | ICD-10-CM | POA: Diagnosis not present

## 2021-10-25 DIAGNOSIS — Z9049 Acquired absence of other specified parts of digestive tract: Secondary | ICD-10-CM

## 2021-10-25 LAB — URINALYSIS, ROUTINE W REFLEX MICROSCOPIC
Bilirubin, UA: NEGATIVE
Glucose, UA: NEGATIVE
Ketones, UA: NEGATIVE
Leukocytes,UA: NEGATIVE
Nitrite, UA: NEGATIVE
Protein,UA: NEGATIVE
RBC, UA: NEGATIVE
Specific Gravity, UA: 1.01 (ref 1.005–1.030)
Urobilinogen, Ur: 0.2 mg/dL (ref 0.2–1.0)
pH, UA: 7 (ref 5.0–7.5)

## 2021-10-25 NOTE — Patient Instructions (Signed)
Hernia, Adult     A hernia happens when an organ or tissue inside your body pushes out through a weak spot in the muscles of your belly (abdomen). This makes a bulge. The bulge may be: In a scar from a surgery that was done in your belly (incisional hernia). Near your belly button (umbilical hernia). In your groin (inguinal hernia). Your groin is the area where your leg meets your lower belly. If you are a male, this type could also be in your scrotum. In your upper thigh (femoral hernia). Inside your belly (hiatal hernia). This happens when your stomach slides above the muscle between your belly and your chest (diaphragm). What are the causes? This condition may be caused by: Lifting heavy things. Coughing over a long period of time. Having trouble pooping (constipation). Trouble pooping can lead to straining. A cut from surgery in your belly. A physical problem that is present at birth. Being very overweight. Smoking. Too much fluid in your belly. A testicle that has not moved down into the scrotum, in males. What are the signs or symptoms? The main symptom is a bulge in the area of the hernia, but a bulge may not always be seen. It may grow bigger or be easier to see when you cough or strain (such as when lifting something heavy). A hernia that can be pushed back into the belly rarely causes pain. A hernia that cannot be pushed back into the belly may lose its blood supply. This may cause: Pain. Fever. A feeling like you may vomit, and vomiting. Swelling. Trouble pooping. How is this treated? A hernia that is small and painless may not need to be treated. A hernia that is large or painful may be treated with surgery. Surgery to treat a hernia involves pushing the bulge back into place and repairing the weak area of the muscle or belly. Follow these instructions at home: Activity Avoid straining the muscles near your hernia. This can happen when you: Lift something heavy. Poop  (have a bowel movement). Do not lift anything that is heavier than 10 lb (4.5 kg), or the limit that you are told. When you lift something heavy, use your leg muscles. Do not use your back muscles to lift. Prevent trouble pooping If told by your doctor, take steps to prevent trouble pooping. You may need to: Drink enough fluid to keep your pee (urine) pale yellow. Take medicines. You will be told what medicines to take. Eat foods that are high in fiber. These include beans, whole grains, and fresh fruits and vegetables. Limit foods that are high in fat and sugar. These include fried or sweet foods. General instructions When you cough, try to cough gently. You may try to push your hernia back in by gently pressing on it when you are lying down. Do not try to force the bulge back in if it will not go in easily. If you are overweight, work with your doctor to lose weight safely. Do not smoke or use any products that contain nicotine or tobacco. If you need help quitting, ask your doctor. If you will be having surgery, watch your hernia for changes in shape, size, or color. Tell your doctor if you see any changes. Take over-the-counter and prescription medicines only as told by your doctor. Keep all follow-up visits. Contact a doctor if: You get new pain, swelling, or redness near your hernia. You poop fewer times in a week than normal. You have trouble pooping. You have  poop that is more dry than normal. You have poop that is harder or larger than normal. Get help right away if: You have a fever or chills. You have belly pain that gets worse. You feel like you may vomit, or you vomit. Your hernia cannot be pushed in by gently pressing on it when you are lying down. Your hernia: Changes in shape or size. Changes color. Feels hard, or it hurts when you touch it. These symptoms may be an emergency. Get help right away. Call your local emergency services (911 in the [place] Do not wait to  see if the symptoms will go away. Do not drive yourself to the hospital. Summary A hernia happens when an organ or tissue inside your body pushes out through a weak spot in the belly muscles. This creates a bulge. If your hernia is small and it does not hurt, you may not need treatment. If your hernia is large or it hurts, you may need surgery. If you will be having surgery, watch your hernia for changes in shape, size, or color. Tell your doctor about any changes. This information is not intended to replace advice given to you by your health care provider. Make sure you discuss any questions you have with your health care provider. Document Revised: 12/21/2019 Document Reviewed: 12/21/2019 Elsevier Patient Education  2023 Elsevier Inc.  

## 2021-10-25 NOTE — Progress Notes (Signed)
Subjective: CC: Abdominal pain PCP: Charles Norlander, Charles Golden Charles Golden is a 88 y.o. male who is accompanied today's visit by his daughter.  He is presenting to clinic today for:  1.  Abdominal pain Patient reports a burning-like pain in the abdomen.  He often feels a fullness and this has been a chronic issue for many years but worse over the last 6 months or so.  He has a history of small bowel resection after he sustained a volvulus about a decade ago.  Dr. Bernette Mayers performed the surgery but has since retired.  Dr. Arnoldo Morale reevaluated him shortly thereafter after he did an inguinal repair for hernia and was told that if any symptoms advance to severe level that he should be seeking reevaluation.  He has since been lost to follow-up as symptoms have been relatively stable until recently.  He does admit that he has had some worsening constipation and straining over the last year or so but he does not necessarily feel that that is related to his abdominal pain.  He denies any blood in stool, nausea, vomiting.  In fact recently he woke up in the middle the night from some abdominal discomfort that was not alleviated by Tums but somehow was alleviated by coffee.  He uses MiraLAX for bowel regimen.   ROS: Per HPI  Allergies  Allergen Reactions   Charles Golden [Meperidine] Other (See Comments)    Pt. States "he woke up during a colonoscopy"   Charles Golden [Tiotropium Bromide Monohydrate] Other (See Comments)    Mild Urinary Retention   Past Medical History:  Diagnosis Date   Acute ischemic stroke ([place]) 04/29/2021   Acute medial meniscal tear 07/20/2014   Atrial fibrillation with RVR ([place]) 05/10/2021   Bronchitis, chronic (HCC)    Chronic headache 08/05/2013   COPD (chronic obstructive pulmonary disease) (HCC)    CVA (cerebral vascular accident) ([place]) 05/01/2021   Enlarged prostate    GERD (gastroesophageal reflux disease)    Glaucoma    Glucagonoma    Hernia, incisional    at present    [facility] (hard of hearing)    IBS (irritable bowel syndrome)    Incisional hernia, without obstruction or gangrene 08/05/2013   Sciatic pain    Sinus congestion    TIA (transient ischemic attack) 04/27/2021    Current Outpatient Medications:    apixaban (ELIQUIS) 2.5 MG TABS tablet, Take 1 tablet (2.5 mg total) by mouth 2 (two) times daily., Disp: 180 tablet, Rfl: 1   aspirin EC 81 MG tablet, Take 1 tablet (81 mg total) by mouth daily. Swallow whole., Disp: 30 tablet, Rfl: 11   atorvastatin (LIPITOR) 40 MG tablet, TAKE 1 TABLET DAILY, Disp: 30 tablet, Rfl: 3   azelastine (ASTELIN) 0.1 % nasal spray, PLACE 1 SPRAY IN EACH NOSTRIL ONCE A DAY AS DIRECTED, Disp: 30 mL, Rfl: 2   BREO ELLIPTA 200-25 MCG/ACT AEPB, Inhale 1 puff into the lungs daily., Disp: 60 each, Rfl: 1   Casanthranol-Docusate Sodium (STOOL SOFTENER PLUS PO), Take by mouth as needed., Disp: , Rfl:    Cholecalciferol (VITAMIN D3) 2000 UNITS TABS, Take 1 tablet by mouth daily., Disp: , Rfl:    CRANBERRY PO, Take by mouth., Disp: , Rfl:    Cyanocobalamin (VITAMIN B-12) 5000 MCG SUBL, Place 1 tablet under the tongue daily., Disp: , Rfl:    finasteride (PROSCAR) 5 MG tablet, Take 5 mg by mouth daily., Disp: , Rfl:    fluticasone (FLONASE) 50 MCG/ACT nasal  spray, Place 2 sprays into both nostrils daily., Disp: 16 g, Rfl: 5   guaiFENesin (MUCINEX) 600 MG 12 hr tablet, Take 2 tablets (1,200 mg total) by mouth 2 (two) times daily as needed for to loosen phlegm or cough., Disp: , Rfl:    latanoprost (XALATAN) 0.005 % ophthalmic solution, Place 1 drop into the left eye at bedtime. (Patient taking differently: Place 1 drop into both eyes at bedtime.), Disp: 2.5 mL, Rfl: 1   pantoprazole (PROTONIX) 40 MG tablet, TAKE 1 TABLET DAILY, Disp: 30 tablet, Rfl: 2   silodosin (RAPAFLO) 8 MG CAPS capsule, Take 8 mg by mouth daily., Disp: , Rfl:    timolol (TIMOPTIC) 0.5 % ophthalmic solution, Place 1 drop into the left eye every morning., Disp: , Rfl:     VENTOLIN HFA 108 (90 Base) MCG/ACT inhaler, Inhale 2 puffs into the lungs every 6 (six) hours as needed for wheezing or shortness of breath., Disp: 6.7 g, Rfl: 5 Social History   Socioeconomic History   Marital status: Married    Spouse name: Charles Golden   Number of children: 3   Years of education: 12+   Highest education level: Bachelor's degree (e.g., BA, AB, BS)  Occupational History   Occupation: Retired    Comment: Immunologist  Tobacco Use   Smoking status: Former    Packs/day: 1.00    Years: 30.00    Pack years: 30.00    Types: Cigarettes    Quit date: 05/28/2006    Years since quitting: 15.4   Smokeless tobacco: Never  Vaping Use   Vaping Use: Never used  Substance and Sexual Activity   Alcohol use: Not Currently    Alcohol/week: 0.0 standard drinks   Drug use: No   Sexual activity: Yes    Birth control/protection: None  Other Topics Concern   Not on file  Social History Narrative   Patient lives at home with [name].    Patient has 3 children.    Patient has his BS   Patient is retired.    Drinks about 2 cups of coffee per day.      [facility]:   He is still married. 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 He is from [place].       Social Determinants of Health   Financial Resource Strain: Not on file  Food Insecurity: Not on file  Transportation Needs: Not on file  Physical Activity: Insufficiently Active   Days of Exercise per Week: 1 day   Minutes of Exercise per Session: 10 min  Stress: Stress Concern Present   Feeling of Stress : To some extent  Social Connections: Not on file  Intimate Partner Violence: Not on file   Family History  Problem Relation Age of Onset   Heart disease Mother        Valve replacement and pacemaker   Colon cancer Father    Asthma Other        cousin   Healthy Daughter    Healthy Daughter    Healthy Daughter      Objective: Office vital signs reviewed. BP (!) 149/83   Pulse 61   Temp (!) 97.4 F (36.3 C) (Temporal)   Resp 20   Ht 5' 10"  (1.778 m)   Wt 166 lb (75.3 kg)   SpO2 97%   BMI 23.82 kg/m   Physical Examination:  General: Awake, alert, nontoxic male, No  acute distress HEENT: Sclera white. GI: Soft.  He has a postsurgical scar along the midline of the abdomen.  There is a softball sized defect in the left upper abdominal wall consistent with an incisional hernia.  This is easily reducible.  Assessment/ Plan: 88 y.o. male   Generalized abdominal pain - Plan: Urinalysis, Routine w reflex microscopic, CMP14+EGFR, CBC with Differential, CT ABDOMEN PELVIS WO CONTRAST  Incisional hernia, without obstruction or gangrene - Plan: CT ABDOMEN PELVIS WO CONTRAST  Change in bowel movement - Plan: CT ABDOMEN PELVIS WO CONTRAST  History of resection of small bowel - Plan: Urinalysis, Routine w reflex microscopic, CMP14+EGFR, CBC with Differential, CT ABDOMEN PELVIS WO CONTRAST  Urinalysis without evidence of infection.  I suspect that the discomfort that he is experiencing is likely from the incisional hernia which was both visible and palpable on exam today.  Likely there was no issues with reducing the hernia.  Given his change in bowel movements I have recommended CT abdomen pelvis.  I suspect we will have to Charles Golden without contrast given renal dysfunction.  Pending that imaging study we will plan to refer appropriately.  We discussed red flag signs and symptoms warranting further evaluation and both he and his family member voiced good understanding today.  Handout provided.  Orders Placed This Encounter  Procedures   Urinalysis, Routine w reflex microscopic   CMP14+EGFR   CBC with Differential   No orders of the defined types were placed in this encounter.    Charles Norlander, Charles Golden [place] [phone]

## 2021-10-26 LAB — CBC WITH DIFFERENTIAL/PLATELET
Basophils Absolute: 0 10*3/uL (ref 0.0–0.2)
Basos: 0 %
EOS (ABSOLUTE): 0.2 10*3/uL (ref 0.0–0.4)
Eos: 2 %
Hematocrit: 38.9 % (ref 37.5–51.0)
Hemoglobin: 13.3 g/dL (ref 13.0–17.7)
Immature Grans (Abs): 0 10*3/uL (ref 0.0–0.1)
Immature Granulocytes: 0 %
Lymphocytes Absolute: 1.6 10*3/uL (ref 0.7–3.1)
Lymphs: 24 %
MCH: 31.4 pg (ref 26.6–33.0)
MCHC: 34.2 g/dL (ref 31.5–35.7)
MCV: 92 fL (ref 79–97)
Monocytes Absolute: 0.6 10*3/uL (ref 0.1–0.9)
Monocytes: 9 %
Neutrophils Absolute: 4.3 10*3/uL (ref 1.4–7.0)
Neutrophils: 65 %
Platelets: 263 10*3/uL (ref 150–450)
RBC: 4.23 x10E6/uL (ref 4.14–5.80)
RDW: 12.6 % (ref 11.6–15.4)
WBC: 6.6 10*3/uL (ref 3.4–10.8)

## 2021-10-26 LAB — CMP14+EGFR
ALT: 15 IU/L (ref 0–44)
AST: 19 IU/L (ref 0–40)
Albumin/Globulin Ratio: 1.7 (ref 1.2–2.2)
Albumin: 3.8 g/dL (ref 3.5–4.6)
Alkaline Phosphatase: 121 IU/L (ref 44–121)
BUN/Creatinine Ratio: 11 (ref 10–24)
BUN: 17 mg/dL (ref 10–36)
Bilirubin Total: 0.3 mg/dL (ref 0.0–1.2)
CO2: 24 mmol/L (ref 20–29)
Calcium: 9.2 mg/dL (ref 8.6–10.2)
Chloride: 102 mmol/L (ref 96–106)
Creatinine, Ser: 1.5 mg/dL — ABNORMAL HIGH (ref 0.76–1.27)
Globulin, Total: 2.2 g/dL (ref 1.5–4.5)
Glucose: 89 mg/dL (ref 70–99)
Potassium: 4.8 mmol/L (ref 3.5–5.2)
Sodium: 139 mmol/L (ref 134–144)
Total Protein: 6 g/dL (ref 6.0–8.5)
eGFR: 43 mL/min/{1.73_m2} — ABNORMAL LOW (ref >59)

## 2021-10-27 ENCOUNTER — Encounter: Payer: Self-pay | Admitting: Family Medicine

## 2021-10-27 NOTE — Telephone Encounter (Signed)
Can you check into this? 

## 2021-10-28 ENCOUNTER — Other Ambulatory Visit: Payer: Self-pay | Admitting: Family Medicine

## 2021-10-31 ENCOUNTER — Telehealth: Payer: Medicare Other

## 2021-11-03 ENCOUNTER — Ambulatory Visit (HOSPITAL_BASED_OUTPATIENT_CLINIC_OR_DEPARTMENT_OTHER)
Admission: RE | Admit: 2021-11-03 | Discharge: 2021-11-03 | Disposition: A | Discharge status: Home / Self Care | Payer: Medicare Other | Source: Ambulatory Visit | Attending: Family Medicine | Admitting: Family Medicine

## 2021-11-03 DIAGNOSIS — R109 Unspecified abdominal pain: Secondary | ICD-10-CM | POA: Diagnosis not present

## 2021-11-03 DIAGNOSIS — K432 Incisional hernia without obstruction or gangrene: Secondary | ICD-10-CM | POA: Insufficient documentation

## 2021-11-03 DIAGNOSIS — R198 Other specified symptoms and signs involving the digestive system and abdomen: Secondary | ICD-10-CM | POA: Insufficient documentation

## 2021-11-03 DIAGNOSIS — Z9049 Acquired absence of other specified parts of digestive tract: Secondary | ICD-10-CM | POA: Diagnosis not present

## 2021-11-03 DIAGNOSIS — R1084 Generalized abdominal pain: Secondary | ICD-10-CM | POA: Insufficient documentation

## 2021-11-07 ENCOUNTER — Other Ambulatory Visit: Payer: Self-pay | Admitting: Family Medicine

## 2021-11-07 ENCOUNTER — Encounter: Payer: Self-pay | Admitting: Family Medicine

## 2021-11-07 DIAGNOSIS — Z9049 Acquired absence of other specified parts of digestive tract: Secondary | ICD-10-CM

## 2021-11-07 DIAGNOSIS — R1084 Generalized abdominal pain: Secondary | ICD-10-CM

## 2021-11-07 DIAGNOSIS — R198 Other specified symptoms and signs involving the digestive system and abdomen: Secondary | ICD-10-CM

## 2021-11-15 NOTE — Telephone Encounter (Signed)
Daughter Charles Golden calling back about this referral. Please call her back since son cannot hear phone. Call Charles Golden [phone] Can pt see a PA there before August at [place] office? Whoever? If not,  Daughter says its ok to send referral where ever insurance is covered.

## 2021-11-15 NOTE — Telephone Encounter (Signed)
Daughter called to let PCP know that pt has an appt scheduled with Dr Liliane [facility] on Monday to discuss Endoscopy.

## 2021-11-20 DIAGNOSIS — K5901 Slow transit constipation: Secondary | ICD-10-CM | POA: Diagnosis not present

## 2021-11-20 DIAGNOSIS — R933 Abnormal findings on diagnostic imaging of other parts of digestive tract: Secondary | ICD-10-CM | POA: Diagnosis not present

## 2021-11-20 DIAGNOSIS — R1010 Upper abdominal pain, unspecified: Secondary | ICD-10-CM | POA: Diagnosis not present

## 2021-11-20 DIAGNOSIS — Z7982 Long term (current) use of aspirin: Secondary | ICD-10-CM | POA: Diagnosis not present

## 2021-11-22 ENCOUNTER — Ambulatory Visit (INDEPENDENT_AMBULATORY_CARE_PROVIDER_SITE_OTHER): Payer: Medicare Other | Admitting: *Deleted

## 2021-11-22 DIAGNOSIS — J441 Chronic obstructive pulmonary disease with (acute) exacerbation: Secondary | ICD-10-CM

## 2021-11-22 DIAGNOSIS — I4891 Unspecified atrial fibrillation: Secondary | ICD-10-CM

## 2021-11-22 DIAGNOSIS — R1084 Generalized abdominal pain: Secondary | ICD-10-CM

## 2021-11-22 DIAGNOSIS — R198 Other specified symptoms and signs involving the digestive system and abdomen: Secondary | ICD-10-CM

## 2021-11-22 NOTE — Chronic Care Management (AMB) (Signed)
Chronic Care Management   CCM RN Visit Note  11/22/2021 Name: Charles Golden MRN: [id number] DOB: 03/13/1926  Subjective: Charles Golden is a 88 y.o. year old male who is a primary care patient of Charles Norlander, DO. The care management team was consulted for assistance with disease management and care coordination needs.    Engaged with patient by telephone for follow up visit in response to provider referral for case management and/or care coordination services.   Consent to Services:  The patient was given information about Chronic Care Management services, agreed to services, and gave verbal consent prior to initiation of services.  Please see initial visit note for detailed documentation.   Patient agreed to services and verbal consent obtained.   Assessment: Review of patient past medical history, allergies, medications, health status, including review of consultants reports, laboratory and other test data, was performed as part of comprehensive evaluation and provision of chronic care management services.   SDOH (Social Determinants of Health) assessments and interventions performed:    CCM Care Plan  Allergies  Allergen Reactions   Demerol [Meperidine] Other (See Comments)    Pt. States "he woke up during a colonoscopy"   Spiriva Handihaler [Tiotropium Bromide Monohydrate] Other (See Comments)    Mild Urinary Retention    Outpatient Encounter Medications as of 11/22/2021  Medication Sig   apixaban (ELIQUIS) 2.5 MG TABS tablet Take 1 tablet (2.5 mg total) by mouth 2 (two) times daily.   aspirin EC 81 MG tablet Take 1 tablet (81 mg total) by mouth daily. Swallow whole.   atorvastatin (LIPITOR) 40 MG tablet TAKE 1 TABLET DAILY   azelastine (ASTELIN) 0.1 % nasal spray PLACE 1 SPRAY IN EACH NOSTRIL ONCE A DAY AS DIRECTED   BREO ELLIPTA 200-25 MCG/ACT AEPB INHALE 1 PUFF DAILY AS DIRECTED   Casanthranol-Docusate Sodium (STOOL SOFTENER PLUS PO) Take by mouth as needed.    Cholecalciferol (VITAMIN D3) 2000 UNITS TABS Take 1 tablet by mouth daily.   CRANBERRY PO Take by mouth.   Cyanocobalamin (VITAMIN B-12) 5000 MCG SUBL Place 1 tablet under the tongue daily.   finasteride (PROSCAR) 5 MG tablet Take 5 mg by mouth daily.   fluticasone (FLONASE) 50 MCG/ACT nasal spray Place 2 sprays into both nostrils daily.   guaiFENesin (MUCINEX) 600 MG 12 hr tablet Take 2 tablets (1,200 mg total) by mouth 2 (two) times daily as needed for to loosen phlegm or cough.   latanoprost (XALATAN) 0.005 % ophthalmic solution Place 1 drop into the left eye at bedtime. (Patient taking differently: Place 1 drop into both eyes at bedtime.)   pantoprazole (PROTONIX) 40 MG tablet TAKE 1 TABLET DAILY   silodosin (RAPAFLO) 8 MG CAPS capsule Take 8 mg by mouth daily.   timolol (TIMOPTIC) 0.5 % ophthalmic solution Place 1 drop into the left eye every morning.   VENTOLIN HFA 108 (90 Base) MCG/ACT inhaler Inhale 2 puffs into the lungs every 6 (six) hours as needed for wheezing or shortness of breath.   No facility-administered encounter medications on file as of 11/22/2021.    Patient Active Problem List   Diagnosis Date Noted   Atrial fibrillation ([place]) 05/10/2021   Protein-calorie malnutrition, severe 05/05/2021   Stenosis of left carotid artery    Chronic tension-type headache, not intractable 12/13/2020   Bilateral sensorineural hearing loss 09/25/2019   Bronchiectasis without complication ([place]) 64/33/2951   Vitamin D deficiency 01/21/2019   Weakness 01/21/2019   Recurrent right inguinal hernia  Presbycusis of both ears 07/19/2017   Restless leg syndrome 11/25/2016   Collagenous colitis 07/24/2016   Deviated nasal septum 09/03/2015   Basal cell carcinoma of nose 09/03/2015   Thoracic aortic atherosclerosis ([place]) 05/03/2015   Upper airway cough syndrome 04/28/2015   GERD without esophagitis 04/11/2015   COPD GOLD I  12/08/2013   Small bowel volvulus ([place]) 08/05/2013   Left inguinal  hernia 08/05/2013   Obstructive chronic bronchitis without exacerbation ([place]) 04/30/2013   Left hip pain 04/29/2013   BPH (benign prostatic hyperplasia) 01/05/2013   IBS (irritable bowel syndrome) 09/03/2012   Chronic rhinosinusitis 09/03/2012   Lung nodules 05/04/2003    Conditions to be addressed/monitored:Atrial Fibrillation and abdominal pain and constipation  Care Plan : [facility] Care Plan  Updates made by Ilean China, RN since 11/22/2021 12:00 AM     Problem: Chronic Disease Management Needs   Priority: High  Onset Date: 08/01/2021     Long-Range Goal: Patient will Work with RN Care Manager Regarding Care Management and [place] with CVA, BPH, COPD, CKD, A Fib   Start Date: 08/01/2021  Expected End Date: 08/02/2022  This Visit's Progress: On track  Recent Progress: On track  Priority: High  Note:   Current Barriers:  Chronic Disease Management support and education needs related to CVA, BPH, COPD, CKD, A Fib Advanced age  16 Clinical Goal(s):  Patient will continue to work with Consulting civil engineer and/or Social Worker to address care management and care coordination needs related to CVA, BPH, COPD, CKD, A Fib as evidenced by adherence to CM Team Scheduled appointments     through collaboration with LCSW, provider, and care team.   Interventions: 1:1 collaboration with primary care provider regarding development and update of comprehensive plan of care as evidenced by provider attestation and co-signature Inter-disciplinary care team collaboration (see longitudinal plan of care) Evaluation of current treatment plan related to  self management and patient's adherence to plan as established by provider Assessed family/social support  Abdominal Pain / Constipation:  (Status: New goal.) Short Term Goal  Discussed intermittent upper abd pain and constipation Reviewed and discussed recent CT results and GI consult note Recent CT scan completed by family doctor  suggesting gastric wall thickening and EGD was suggested Discussed need for EGD. This has not been scheduled. Will need to coordinate discontinuing ASA and Eliquis therapy prior to procedure. Reviewed and discussed medications Liquid antacid for abd pain in addition to daily protonix. Patient has tried the antacid once but couldn't tell that it helped. Encouraged continued use Discussed recommendation to use miralax each night. He is doing this and has d/c colace per GI instructions Encouraged to reach out to GI or PCP with any new or worsening symptoms  A-fib:  (Status: Goal on Track (progressing): YES.) Long Term Goal  Reviewed importance of adherence to anticoagulant exactly as prescribed Counseled on bleeding risk associated with Eliquis and importance of self-monitoring for signs/symptoms of bleeding Counseled on seeking medical attention after a head injury or if there is blood in the urine/stool Assessed social determinant of health barriers   Patient Goals/Self-Care Activities: Take medications as prescribed   Attend all scheduled provider appointments Perform IADL's (shopping, preparing meals, housekeeping, managing finances) independently Call provider office for new concerns or questions  Call RN Care Manager as needed [phone] Seek appropriate medical attention for any new or worsening symptoms Continue Miralax nightly for constipation Take liquid antacid for upper abdominal pain Avoid NSAIDs other than prescribed  daily aspirin  Plan:Telephone follow up appointment with care management team member scheduled for:  12/22/21 at 12:45 with [facility] The patient has been provided with contact information for the care management team and has been advised to call with any health related questions or concerns.   Chong Sicilian, BSN, RN-BC Embedded Chronic Care Manager [facility] [place] Management Direct Dial: [phone]

## 2021-11-22 NOTE — Patient Instructions (Signed)
Visit Information  Thank you for taking time to visit with me today. Please don't hesitate to contact me if I can be of assistance to you before our next scheduled telephone appointment.  Following are the goals we discussed today:  Take medications as prescribed   Attend all scheduled provider appointments Perform IADL's (shopping, preparing meals, housekeeping, managing finances) independently Call provider office for new concerns or questions  Call RN Care Manager as needed [phone] Seek appropriate medical attention for any new or worsening symptoms Continue Miralax nightly for constipation Take liquid antacid for upper abdominal pain Avoid NSAIDs other than prescribed daily aspirin  Our next appointment is by telephone on 12/22/21 at 12:45  Please call the care guide team at [phone] if you need to cancel or reschedule your appointment.   If you are experiencing a Mental Health or [place] or need someone to talk to, please call the [facility]: [phone] call [phone]   Patient verbalizes understanding of instructions and care plan provided today and agrees to view in [place]. Active MyChart status and patient understanding of how to access instructions and care plan via MyChart confirmed with patient.     Chong Sicilian, BSN, RN-BC Embedded Chronic Care Manager [facility] / [place] Management Direct Dial: [phone]

## 2021-11-23 ENCOUNTER — Other Ambulatory Visit: Payer: Self-pay | Admitting: Family Medicine

## 2021-11-23 ENCOUNTER — Telehealth: Payer: Self-pay | Admitting: Neurology

## 2021-11-23 NOTE — Telephone Encounter (Signed)
LVM and sent mychart msg informing pt of r/s needed for 9/18 appt- MD out.

## 2021-12-05 ENCOUNTER — Other Ambulatory Visit: Payer: Self-pay | Admitting: Family Medicine

## 2021-12-11 ENCOUNTER — Other Ambulatory Visit: Payer: Self-pay | Admitting: Family Medicine

## 2021-12-11 NOTE — Telephone Encounter (Signed)
I don't see where you told him to increase protonix to BID but did see gastro had mentioned possibly going to twice daily if endoscopy showed ulcerative disease or gastropathy. I couldn't find endoscopy-wasn't sure if it was completed outside of [facility] and you had gotten a copy. Please advise.

## 2021-12-14 ENCOUNTER — Ambulatory Visit: Payer: Medicare Other | Admitting: Family Medicine

## 2021-12-15 ENCOUNTER — Ambulatory Visit (INDEPENDENT_AMBULATORY_CARE_PROVIDER_SITE_OTHER): Payer: Medicare Other | Admitting: Family Medicine

## 2021-12-15 ENCOUNTER — Encounter: Payer: Self-pay | Admitting: Family Medicine

## 2021-12-15 VITALS — BP 105/67 | HR 82 | Temp 97.2°F | Ht 70.0 in | Wt 164.8 lb

## 2021-12-15 DIAGNOSIS — R1084 Generalized abdominal pain: Secondary | ICD-10-CM | POA: Diagnosis not present

## 2021-12-15 DIAGNOSIS — Z7901 Long term (current) use of anticoagulants: Secondary | ICD-10-CM | POA: Diagnosis not present

## 2021-12-15 DIAGNOSIS — Z8673 Personal history of transient ischemic attack (TIA), and cerebral infarction without residual deficits: Secondary | ICD-10-CM

## 2021-12-15 DIAGNOSIS — N1832 Chronic kidney disease, stage 3b: Secondary | ICD-10-CM | POA: Diagnosis not present

## 2021-12-15 NOTE — Patient Instructions (Signed)
STOP eliquis 2 days before EGD and STOP Aspirin TODAY.  Traditionally, they recommend 7 days but we're too close now so stop TODAY.

## 2021-12-15 NOTE — Progress Notes (Signed)
Subjective: CC: f/u abdominal pain PCP: Charles Norlander, DO YWV:[id number] R Golden is a 88 y.o. male presenting to clinic today for:  1. Abdominal pain Patient was last seen for abdominal pain at the end of May.  We performed a CAT scan which really showed no significant pathology to explain except for some thickening of his stomach lining.  It was recommended that he follow-up with gastroenterology for endoscopy and he has an appointment scheduled for 12/19/2021 with them.  Since our last visit, he notes that abdominal pain has gotten slightly better.  He still has episodes where it is severe and will wake him from sleep but overall he has not had it as frequently.  He does have an appointment coming up next Wednesday and he anticipates EGD at that visit.  His daughter asks as to when he should hold his Eliquis and aspirin.  He is currently anticoagulated for atrial fibrillation with history of CVA.  2.  CKD 3B Patient has been fluctuating between CKD 3A and 3B.  He watched his wife suffer with end-stage renal disease and both he and his daughter get worried about this.  They are wondering when he should be referred to nephrology.  He reports good urine output.  He urinates 6-7 times per day with up to 2 of these events being during the night.  He is under the care of urology and on medications to reduce his prostate size and improve flow as well.   ROS: Per HPI  Allergies  Allergen Reactions   Demerol [Meperidine] Other (See Comments)    Pt. States "he woke up during a colonoscopy"   Spiriva Handihaler [Tiotropium Bromide Monohydrate] Other (See Comments)    Mild Urinary Retention   Past Medical History:  Diagnosis Date   Acute ischemic stroke ([place]) 04/29/2021   Acute medial meniscal tear 07/20/2014   Atrial fibrillation with RVR ([place]) 05/10/2021   Bronchitis, chronic (HCC)    Chronic headache 08/05/2013   COPD (chronic obstructive pulmonary disease) (HCC)    CVA (cerebral vascular  accident) ([place]) 05/01/2021   Enlarged prostate    GERD (gastroesophageal reflux disease)    Glaucoma    Glucagonoma    Hernia, incisional    at present   [facility] (hard of hearing)    IBS (irritable bowel syndrome)    Incisional hernia, without obstruction or gangrene 08/05/2013   Sciatic pain    Sinus congestion    TIA (transient ischemic attack) 04/27/2021    Current Outpatient Medications:    apixaban (ELIQUIS) 2.5 MG TABS tablet, Take 1 tablet (2.5 mg total) by mouth 2 (two) times daily., Disp: 180 tablet, Rfl: 1   aspirin EC 81 MG tablet, Take 1 tablet (81 mg total) by mouth daily. Swallow whole., Disp: 30 tablet, Rfl: 11   atorvastatin (LIPITOR) 40 MG tablet, TAKE 1 TABLET DAILY, Disp: 30 tablet, Rfl: 3   azelastine (ASTELIN) 0.1 % nasal spray, PLACE 1 SPRAY IN EACH NOSTRIL ONCE A DAY AS DIRECTED, Disp: 30 mL, Rfl: 2   BREO ELLIPTA 200-25 MCG/ACT AEPB, INHALE 1 PUFF DAILY AS DIRECTED, Disp: 60 each, Rfl: 0   Casanthranol-Docusate Sodium (STOOL SOFTENER PLUS PO), Take by mouth as needed., Disp: , Rfl:    Cholecalciferol (VITAMIN D3) 2000 UNITS TABS, Take 1 tablet by mouth daily., Disp: , Rfl:    CRANBERRY PO, Take by mouth., Disp: , Rfl:    Cyanocobalamin (VITAMIN B-12) 5000 MCG SUBL, Place 1 tablet under the tongue daily.,  Disp: , Rfl:    finasteride (PROSCAR) 5 MG tablet, Take 5 mg by mouth daily., Disp: , Rfl:    fluticasone (FLONASE) 50 MCG/ACT nasal spray, Place 2 sprays into both nostrils daily., Disp: 16 g, Rfl: 5   guaiFENesin (MUCINEX) 600 MG 12 hr tablet, Take 2 tablets (1,200 mg total) by mouth 2 (two) times daily as needed for to loosen phlegm or cough., Disp: , Rfl:    latanoprost (XALATAN) 0.005 % ophthalmic solution, Place 1 drop into the left eye at bedtime. (Patient taking differently: Place 1 drop into both eyes at bedtime.), Disp: 2.5 mL, Rfl: 1   pantoprazole (PROTONIX) 40 MG tablet, Take 1 tablet (40 mg total) by mouth 2 (two) times daily before a meal., Disp: 180  tablet, Rfl: 0   silodosin (RAPAFLO) 8 MG CAPS capsule, Take 8 mg by mouth daily., Disp: , Rfl:    timolol (TIMOPTIC) 0.5 % ophthalmic solution, Place 1 drop into the left eye every morning., Disp: , Rfl:    VENTOLIN HFA 108 (90 Base) MCG/ACT inhaler, Inhale 2 puffs into the lungs every 6 (six) hours as needed for wheezing or shortness of breath., Disp: 6.7 g, Rfl: 5 Social History   Socioeconomic History   Marital status: Married    Spouse name: [name]   Number of children: 3   Years of education: 12+   Highest education level: Bachelor's degree (e.g., BA, AB, BS)  Occupational History   Occupation: Retired    Comment: Immunologist  Tobacco Use   Smoking status: Former    Packs/day: 1.00    Years: 30.00    Total pack years: 30.00    Types: Cigarettes    Quit date: 05/28/2006    Years since quitting: 15.5   Smokeless tobacco: Never  Vaping Use   Vaping Use: Never used  Substance and Sexual Activity   Alcohol use: Not Currently    Alcohol/week: 0.0 standard drinks of alcohol   Drug use: No   Sexual activity: Yes    Birth control/protection: None  Other Topics Concern   Not on file  Social History Narrative   Patient lives at home with [name].    Patient has 3 children.    Patient has his BS   Patient is retired.    Drinks about 2 cups of coffee per day.      [facility]:   He is still married. 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 He is from [place].       Social Determinants of Health   Financial Resource Strain: Low Risk  (02/09/2020)   Overall Financial Resource Strain (CARDIA)    Difficulty of Paying Living Expenses: Not hard at all  Food Insecurity: No Food Insecurity (06/09/2019)   Hunger Vital Sign    Worried About Running Out of Food in the Last Year: Never true    Ran Out of Food in the Last Year: Never true  Transportation Needs: No Transportation Needs  (02/09/2020)   PRAPARE - Hydrologist (Medical): No    Lack of Transportation (Non-Medical): No  Physical Activity: Insufficiently Active (05/19/2021)   Exercise Vital Sign    Days of Exercise per Week: 1 day    Minutes of Exercise per Session: 10 min  Stress: Stress Concern Present (07/17/2021)   [place]    Feeling of  Stress : To some extent  Social Connections: Socially Integrated (06/09/2019)   Social Connection and Isolation Panel [NHANES]    Frequency of Communication with Friends and Family: More than three times a week    Frequency of Social Gatherings with Friends and Family: More than three times a week    Attends Religious Services: More than 4 times per year    Active Member of [employer] or Organizations: Yes    Attends Music therapist: More than 4 times per year    Marital Status: Married  Human resources officer Violence: Not At Risk (06/09/2019)   Humiliation, Afraid, Rape, and Kick questionnaire    Fear of Current or Ex-Partner: No    Emotionally Abused: No    Physically Abused: No    Sexually Abused: No   Family History  Problem Relation Age of Onset   Heart disease Mother        Valve replacement and pacemaker   Colon cancer Father    Asthma Other        cousin   Healthy Daughter    Healthy Daughter    Healthy Daughter     Objective: Office vital signs reviewed. BP 105/67   Pulse 82   Temp (!) 97.2 F (36.2 C)   Ht '5\' 10"'$  (1.778 m)   Wt 164 lb 12.8 oz (74.8 kg)   SpO2 94%   BMI 23.65 kg/m   Physical Examination:  General: Awake, chronically ill-appearing, elderly male, No acute distress.  Very alert and conversive HEENT: Sclera white Cardio: regular rate and rhythm, S1S2 heard, no murmurs appreciated Pulm: clear to auscultation bilaterally, no wheezes, rhonchi or rales; normal work of breathing on room air GI: Soft, flat, nondistended MSK: Slow gait  and station  Assessment/ Plan: 88 y.o. male   Stage 3b chronic kidney disease ([place]) - Plan: Ambulatory referral to Nephrology  Generalized abdominal pain  Chronic anticoagulation  History of CVA in adulthood  Referral to nephrology, Dr. Theador Hawthorne.  Patient border CKD 3A and 3B.  Discussed ways to reduce progression of renal disease  Generalized abdominal pain is getting somewhat better.  Awaiting recommendations by GI given abnormal CT findings  Advised to hold Eliquis 2 days prior to procedure.  Given his high risk he can technically hold his aspirin for as little as 2 days before procedure but I have recommended 5 days given his advanced age and high risk for bleed.  We will CC his GI provider as FYI  No orders of the defined types were placed in this encounter.  No orders of the defined types were placed in this encounter.    Charles Norlander, DO [place] [phone]

## 2021-12-18 DIAGNOSIS — R351 Nocturia: Secondary | ICD-10-CM | POA: Diagnosis not present

## 2021-12-18 DIAGNOSIS — N200 Calculus of kidney: Secondary | ICD-10-CM | POA: Diagnosis not present

## 2021-12-18 DIAGNOSIS — N401 Enlarged prostate with lower urinary tract symptoms: Secondary | ICD-10-CM | POA: Diagnosis not present

## 2021-12-18 DIAGNOSIS — R3912 Poor urinary stream: Secondary | ICD-10-CM | POA: Diagnosis not present

## 2021-12-20 DIAGNOSIS — K317 Polyp of stomach and duodenum: Secondary | ICD-10-CM | POA: Diagnosis not present

## 2021-12-20 DIAGNOSIS — K2950 Unspecified chronic gastritis without bleeding: Secondary | ICD-10-CM | POA: Diagnosis not present

## 2021-12-20 DIAGNOSIS — Z7901 Long term (current) use of anticoagulants: Secondary | ICD-10-CM | POA: Diagnosis not present

## 2021-12-20 DIAGNOSIS — R933 Abnormal findings on diagnostic imaging of other parts of digestive tract: Secondary | ICD-10-CM | POA: Diagnosis not present

## 2021-12-20 DIAGNOSIS — K2289 Other specified disease of esophagus: Secondary | ICD-10-CM | POA: Diagnosis not present

## 2021-12-20 DIAGNOSIS — K2980 Duodenitis without bleeding: Secondary | ICD-10-CM | POA: Diagnosis not present

## 2021-12-20 DIAGNOSIS — K31A19 Gastric intestinal metaplasia without dysplasia, unspecified site: Secondary | ICD-10-CM | POA: Diagnosis not present

## 2021-12-20 DIAGNOSIS — K2970 Gastritis, unspecified, without bleeding: Secondary | ICD-10-CM | POA: Diagnosis not present

## 2021-12-20 DIAGNOSIS — Z8673 Personal history of transient ischemic attack (TIA), and cerebral infarction without residual deficits: Secondary | ICD-10-CM | POA: Diagnosis not present

## 2021-12-20 DIAGNOSIS — Z87891 Personal history of nicotine dependence: Secondary | ICD-10-CM | POA: Diagnosis not present

## 2021-12-20 DIAGNOSIS — K449 Diaphragmatic hernia without obstruction or gangrene: Secondary | ICD-10-CM | POA: Diagnosis not present

## 2021-12-20 DIAGNOSIS — I4891 Unspecified atrial fibrillation: Secondary | ICD-10-CM | POA: Diagnosis not present

## 2021-12-20 DIAGNOSIS — Z79899 Other long term (current) drug therapy: Secondary | ICD-10-CM | POA: Diagnosis not present

## 2021-12-20 DIAGNOSIS — K2270 Barrett's esophagus without dysplasia: Secondary | ICD-10-CM | POA: Diagnosis not present

## 2021-12-20 DIAGNOSIS — K31A15 Gastric intestinal metaplasia without dysplasia, involving multiple sites: Secondary | ICD-10-CM | POA: Diagnosis not present

## 2021-12-20 DIAGNOSIS — R1010 Upper abdominal pain, unspecified: Secondary | ICD-10-CM | POA: Diagnosis not present

## 2021-12-20 DIAGNOSIS — K208 Other esophagitis without bleeding: Secondary | ICD-10-CM | POA: Diagnosis not present

## 2021-12-20 DIAGNOSIS — K219 Gastro-esophageal reflux disease without esophagitis: Secondary | ICD-10-CM | POA: Diagnosis not present

## 2021-12-22 ENCOUNTER — Ambulatory Visit: Payer: Medicare Other | Admitting: *Deleted

## 2021-12-22 DIAGNOSIS — I4891 Unspecified atrial fibrillation: Secondary | ICD-10-CM

## 2021-12-22 NOTE — Chronic Care Management (AMB) (Signed)
Care Management    RN Visit Note  12/22/2021 Name: Charles Golden MRN: [id number] DOB: June 26, 1925  Subjective: Charles Golden is a 88 y.o. year old male who is a primary care patient of Charles Norlander, DO. The care management team was consulted for assistance with disease management and care coordination needs.    Engaged with patient by telephone for follow up visit in response to provider referral for case management and/or care coordination services.   Consent to Services:   [name] was given information about Care Management services today including:  Care Management services includes personalized support from designated clinical staff supervised by his physician, including individualized plan of care and coordination with other care providers 24/7 contact phone numbers for assistance for urgent and routine care needs. The patient may stop case management services at any time by phone call to the office staff.  Patient agreed to services and consent obtained.   Assessment: Review of patient past medical history, allergies, medications, health status, including review of consultants reports, laboratory and other test data, was performed as part of comprehensive evaluation and provision of chronic care management services.   SDOH (Social Determinants of Health) assessments and interventions performed:    Care Plan  Allergies  Allergen Reactions   Demerol [Meperidine] Other (See Comments)    Pt. States "he woke up during a colonoscopy"   Spiriva Handihaler [Tiotropium Bromide Monohydrate] Other (See Comments)    Mild Urinary Retention    Outpatient Encounter Medications as of 12/22/2021  Medication Sig   apixaban (ELIQUIS) 2.5 MG TABS tablet Take 1 tablet (2.5 mg total) by mouth 2 (two) times daily.   aspirin EC 81 MG tablet Take 1 tablet (81 mg total) by mouth daily. Swallow whole.   atorvastatin (LIPITOR) 40 MG tablet TAKE 1 TABLET DAILY   azelastine (ASTELIN) 0.1 %  nasal spray PLACE 1 SPRAY IN EACH NOSTRIL ONCE A DAY AS DIRECTED   BREO ELLIPTA 200-25 MCG/ACT AEPB INHALE 1 PUFF DAILY AS DIRECTED   Cholecalciferol (VITAMIN D3) 2000 UNITS TABS Take 1 tablet by mouth daily.   CRANBERRY PO Take by mouth.   Cyanocobalamin (VITAMIN B-12) 5000 MCG SUBL Place 1 tablet under the tongue daily.   finasteride (PROSCAR) 5 MG tablet Take 5 mg by mouth daily.   fluticasone (FLONASE) 50 MCG/ACT nasal spray Place 2 sprays into both nostrils daily.   guaiFENesin (MUCINEX) 600 MG 12 hr tablet Take 2 tablets (1,200 mg total) by mouth 2 (two) times daily as needed for to loosen phlegm or cough.   latanoprost (XALATAN) 0.005 % ophthalmic solution Place 1 drop into the left eye at bedtime. (Patient taking differently: Place 1 drop into both eyes at bedtime.)   pantoprazole (PROTONIX) 40 MG tablet Take 1 tablet (40 mg total) by mouth 2 (two) times daily before a meal.   silodosin (RAPAFLO) 8 MG CAPS capsule Take 8 mg by mouth daily.   timolol (TIMOPTIC) 0.5 % ophthalmic solution Place 1 drop into the left eye every morning.   VENTOLIN HFA 108 (90 Base) MCG/ACT inhaler Inhale 2 puffs into the lungs every 6 (six) hours as needed for wheezing or shortness of breath.   No facility-administered encounter medications on file as of 12/22/2021.    Patient Active Problem List   Diagnosis Date Noted   Atrial fibrillation ([place]) 05/10/2021   Protein-calorie malnutrition, severe 05/05/2021   Stenosis of left carotid artery    Chronic tension-type headache, not intractable 12/13/2020  Bilateral sensorineural hearing loss 09/25/2019   Bronchiectasis without complication ([place]) 38/02/1750   Vitamin D deficiency 01/21/2019   Weakness 01/21/2019   Recurrent right inguinal hernia    Presbycusis of both ears 07/19/2017   Restless leg syndrome 11/25/2016   Collagenous colitis 07/24/2016   Deviated nasal septum 09/03/2015   Basal cell carcinoma of nose 09/03/2015   Thoracic aortic  atherosclerosis ([place]) 05/03/2015   Upper airway cough syndrome 04/28/2015   GERD without esophagitis 04/11/2015   COPD GOLD I  12/08/2013   Small bowel volvulus ([place]) 08/05/2013   Left inguinal hernia 08/05/2013   Obstructive chronic bronchitis without exacerbation ([place]) 04/30/2013   Left hip pain 04/29/2013   BPH (benign prostatic hyperplasia) 01/05/2013   IBS (irritable bowel syndrome) 09/03/2012   Chronic rhinosinusitis 09/03/2012   Lung nodules 05/04/2003    Conditions to be addressed/monitored: Atrial Fibrillation and GI complaints  Care Plan : [facility] Care Plan  Updates made by Ilean China, RN since 12/22/2021 12:00 AM  Completed 12/22/2021   Problem: Chronic Disease Management Needs Resolved 12/22/2021  Priority: High  Onset Date: 08/01/2021     Long-Range Goal: Patient will Work with RN Care Manager Regarding Care Management and Care Coordination Associated with CVA, BPH, COPD, CKD, A Fib Completed 12/22/2021  Start Date: 08/01/2021  Expected End Date: 08/02/2022  Recent Progress: On track  Priority: High  Note:   Closing RN Care management care plan and transitioning to Care Coordination.   Current Barriers:  Chronic Disease Management support and education needs related to CVA, BPH, COPD, CKD, A Fib Advanced age  29 Clinical Goal(s):  Patient will continue to work with Consulting civil engineer and/or Social Worker to address care management and care coordination needs related to CVA, BPH, COPD, CKD, A Fib as evidenced by adherence to CM Team Scheduled appointments     through collaboration with LCSW, provider, and care team.   Interventions: 1:1 collaboration with primary care provider regarding development and update of comprehensive plan of care as evidenced by provider attestation and co-signature Inter-disciplinary care team collaboration (see longitudinal plan of care) Evaluation of current treatment plan related to  self management and patient's adherence to plan as  established by provider Assessed family/social support  Abdominal Pain / Constipation:  (Status: Goal on Track (progressing): YES.) Short Term Goal  Discussed intermittent upper abd pain and constipation Reviewed and discussed medications Liquid antacid for abd pain in addition to daily protonix. Patient has tried the antacid once but couldn't tell that it helped. Encouraged continued use Discussed recommendation to use miralax each night. He is doing this and has d/c colace per GI instructions Reviewed and discussed recent EGD through GI with [facility].  Several areas were biopsied. Awaiting results. No GI follow-up scheduled. I expect they will schedule that when the results are back.  No new complaints. Patient is stable.  Encouraged to reach out to GI or PCP with any new or worsening symptoms  A-fib:  (Status: Goal on Track (progressing): YES.) Long Term Goal  Reviewed importance of adherence to anticoagulant exactly as prescribed Counseled on bleeding risk associated with Eliquis and importance of self-monitoring for signs/symptoms of bleeding Counseled on seeking medical attention after a head injury or if there is blood in the urine/stool Assessed social determinant of health barriers   Patient Goals/Self-Care Activities: Take medications as prescribed   Attend all scheduled provider appointments Perform IADL's (shopping, preparing meals, housekeeping, managing finances) independently Call provider office for new concerns or  questions  Call RN Care Manager as needed [phone] Seek appropriate medical attention for any new or worsening symptoms Continue Miralax nightly for constipation Take liquid antacid for upper abdominal pain Avoid NSAIDs other than prescribed daily aspirin Follow-up with GI with any new or worsening symptoms and to discuss biopsy results from EGD Talk with Care Coordination nurse, Jackelyn Poling, RN Care Coordinator, on 01/01/22  Plan: Telephone follow up  appointment with care management team member scheduled for:  01/01/22 at 11:30 with Jackelyn Poling, RN Care Coordinator The patient has been provided with contact information for the care management team and has been advised to call with any health related questions or concerns.   Chong Sicilian, BSN, RN-BC Embedded Chronic Care Manager [facility] / [place] Management Direct Dial: [phone]

## 2021-12-22 NOTE — Patient Instructions (Signed)
Visit Information  Thank you for taking time to visit with me today. Please don't hesitate to contact me if I can be of assistance to your before our next scheduled telephone appointment.  Following are the goals we discussed today:  Take medications as prescribed   Attend all scheduled provider appointments Perform IADL's (shopping, preparing meals, housekeeping, managing finances) independently Call provider office for new concerns or questions  Call RN Care Manager as needed [phone] Seek appropriate medical attention for any new or worsening symptoms Continue Miralax nightly for constipation Take liquid antacid for upper abdominal pain Avoid NSAIDs other than prescribed daily aspirin Follow-up with GI with any new or worsening symptoms and to discuss biopsy results from EGD Talk with Care Coordination nurse, Jackelyn Poling, RN Care Coordinator, on 01/01/22  Your next appointment is by telephone on 01/01/22 at 11:30 with Jackelyn Poling, RN Care Coordinator  Please call the care guide team at [phone] if you need to cancel or reschedule your appointment.   If you are experiencing a Mental Health or [place] or need someone to talk to, please call the [facility]: [phone] call [phone]   Patient verbalizes understanding of instructions and care plan provided today and agrees to view in [place]. Active MyChart status and patient understanding of how to access instructions and care plan via MyChart confirmed with patient.     Chong Sicilian, BSN, RN-BC Embedded Chronic Care Manager [facility] / [place] Management Direct Dial: [phone]

## 2021-12-23 ENCOUNTER — Encounter: Payer: Self-pay | Admitting: Family Medicine

## 2021-12-27 ENCOUNTER — Other Ambulatory Visit: Payer: Self-pay | Admitting: Family Medicine

## 2022-01-01 ENCOUNTER — Ambulatory Visit: Payer: Self-pay | Admitting: *Deleted

## 2022-01-01 ENCOUNTER — Encounter: Payer: Self-pay | Admitting: *Deleted

## 2022-01-01 NOTE — Patient Instructions (Addendum)
Visit Information  Chrys Racer & Mr Joslyn, Thank you for taking time to visit with me today. Please don't hesitate to contact me if I can be of assistance to you.   It was noted also that Mr Klaus has not had an annual wellness visit for 2023 at the primary care provider office. This may need to be scheduled if he does not receive a call from the staff  Following are the goals we discussed today:   Goals Addressed               This Visit's Progress     Patient Stated     Manage abdominal pain/AWV [facility]) (pt-stated)   On track     Started 01/01/22  Care Coordination Interventions: Reviewed provider established plan for pain management Screening for signs and symptoms of depression related to chronic disease state  Assessed social determinant of health barriers Discussed general information on hiatal hernia causes, home management with medicines, sitting up after eating Discussed pending GI biopsy results after EGD  Notes: 01/01/22 reviewed noted pending covid vaccination for 2023 with [name] message to pcp staff of pending Annual wellness visit (AWV) and noted need of schedule appointment in after visit summary via mychart provided Online education materials on hiatal hernia, gastritis, duodenitis, video on BPH         Our next appointment is by telephone on February 03, 2022 at 1130 am  Please call the care guide team at [phone] if you need to cancel or reschedule your appointment.   If you are experiencing a Mental Health or [place] or need someone to talk to, please call the Suicide and Crisis Lifeline: 988 call the [place] National Suicide Prevention Lifeline: (920) 162-8305 or TTY: 220-057-1424 TTY 743 771 1215) to talk to a trained counselor call 1-800-273-TALK (toll free, 24 hour hotline) call the [facility]: 309-029-6904 call 911   Patient verbalizes understanding of instructions and care plan provided today and agrees to  view in [place]. Active MyChart status and patient understanding of how to access instructions and care plan via MyChart confirmed with patient.     The care management team will reach out to the patient again over the next 30 business days.   Daisuke Bailey L. Lavina Hamman, RN, BSN, [place] Coordinator Office number [phone]

## 2022-01-01 NOTE — Patient Outreach (Signed)
  Care Coordination   Initial Visit Note   01/01/2022 Name: [name] MRN: [id number] DOB: 04-21-1926  Charles Golden is a 88 y.o. year old male who sees Charles Norlander, DO for primary care. I spoke with  Charles Golden by phone today  What matters to the patients health and wellness today?  Stomach pain with recent evaluation, why having pain and future treatment plan, pending GI biopsy with follow up appointment    Goals Addressed               This Visit's Progress     Patient Stated     Manage abdominal pain/AWV [facility]) (pt-stated)   On track     Started 01/01/22  Care Coordination Interventions: Reviewed provider established plan for pain management Screening for signs and symptoms of depression related to chronic disease state  Assessed social determinant of health barriers Discussed general information on hiatal hernia causes, home management with medicines, sitting up after eating Discussed pending GI biopsy results after EGD  Notes: 01/01/22 reviewed noted pending covid vaccination for 2023 with Charles Golden message to pcp staff of pending Annual wellness visit (AWV) and noted need of schedule appointment in after visit summary via mychart provided Online education materials on hiatal hernia, gastritis, duodenitis, video on BPH         SDOH assessments and interventions completed:  Yes    Care Coordination Interventions Activated:  Yes  Care Coordination Interventions:  Yes, provided   Follow up plan: Follow up call scheduled for 02/01/22 1130    Encounter Outcome:  Pt. Visit Completed   Charles Golden L. Lavina Hamman, RN, BSN, [place] Coordinator Office number [phone]

## 2022-01-03 ENCOUNTER — Other Ambulatory Visit: Payer: Self-pay | Admitting: Family Medicine

## 2022-01-03 DIAGNOSIS — H40051 Ocular hypertension, right eye: Secondary | ICD-10-CM | POA: Diagnosis not present

## 2022-01-03 DIAGNOSIS — H401122 Primary open-angle glaucoma, left eye, moderate stage: Secondary | ICD-10-CM | POA: Diagnosis not present

## 2022-01-22 ENCOUNTER — Encounter: Payer: Self-pay | Admitting: Family Medicine

## 2022-01-22 ENCOUNTER — Other Ambulatory Visit: Payer: Self-pay | Admitting: Family Medicine

## 2022-01-22 NOTE — Telephone Encounter (Signed)
[place] NTBS 30 days given 12/29/21

## 2022-01-22 NOTE — Telephone Encounter (Signed)
LMTCB TO SCHEDULE APPT LETTER MAILED 

## 2022-01-24 ENCOUNTER — Encounter: Payer: Self-pay | Admitting: Family Medicine

## 2022-01-24 ENCOUNTER — Ambulatory Visit (INDEPENDENT_AMBULATORY_CARE_PROVIDER_SITE_OTHER): Payer: Medicare Other | Admitting: Family Medicine

## 2022-01-24 VITALS — BP 127/69 | HR 63 | Temp 97.5°F | Resp 20 | Ht 70.0 in | Wt 161.0 lb

## 2022-01-24 DIAGNOSIS — J449 Chronic obstructive pulmonary disease, unspecified: Secondary | ICD-10-CM | POA: Diagnosis not present

## 2022-01-24 DIAGNOSIS — K5909 Other constipation: Secondary | ICD-10-CM | POA: Diagnosis not present

## 2022-01-24 DIAGNOSIS — N1832 Chronic kidney disease, stage 3b: Secondary | ICD-10-CM | POA: Diagnosis not present

## 2022-01-24 DIAGNOSIS — R5381 Other malaise: Secondary | ICD-10-CM | POA: Diagnosis not present

## 2022-01-24 DIAGNOSIS — I4811 Longstanding persistent atrial fibrillation: Secondary | ICD-10-CM

## 2022-01-24 DIAGNOSIS — Z8673 Personal history of transient ischemic attack (TIA), and cerebral infarction without residual deficits: Secondary | ICD-10-CM | POA: Diagnosis not present

## 2022-01-24 MED ORDER — TRELEGY ELLIPTA 200-62.5-25 MCG/ACT IN AEPB: 1.0000 | INHALATION_SPRAY | Freq: Every day | RESPIRATORY_TRACT | 0 refills | Status: DC

## 2022-01-24 MED ORDER — APIXABAN 2.5 MG PO TABS
2.5000 mg | ORAL_TABLET | Freq: Two times a day (BID) | ORAL | 3 refills | Status: DC
  Filled 2022-10-29: qty 180, 90d supply, fill #0

## 2022-01-24 MED ORDER — ATORVASTATIN CALCIUM 40 MG PO TABS: 40.0000 mg | ORAL_TABLET | Freq: Every day | ORAL | 3 refills | Status: DC

## 2022-01-24 NOTE — Progress Notes (Signed)
Subjective: CC: Med refills PCP: [name], DO IEP:PIRJJ Charles Golden is a 88 y.o. male presenting to clinic today for:  1.  Atrial fibrillation with history of CVA Patient needs refills on Eliquis and atorvastatin.  No reports of bleeding.  Had EGD performed and was placed on Carafate but has not noticed a huge difference with that medicine yet  2.  Sensation of weakness Patient reports sensation of weakness.  He admits that he does not really walk or exercise much.  No falls.  3.  Chronic constipation Patient reports that he has to use MiraLAX in efforts to move his bowels.  They just do not move like they used to and he wonders if there is anything that he can do to promote bowel movements.  Saw gastroenterology recently as above  4.  COPD Patient does not feel like COPD is well controlled and he would like to see if there is anything else he can do to improve symptoms.  Has an appointment with his pulmonologist in 2 weeks.  Currently treated with Breo.  Has had some mild urinary retention with Spiriva previously.  Not reporting any hemoptysis but does report productive cough with clear sputum intermittently   ROS: Per HPI  Allergies  Allergen Reactions   Demerol [Meperidine] Other (See Comments)    Pt. States "he woke up during a colonoscopy"   Spiriva Handihaler [Tiotropium Bromide Monohydrate] Other (See Comments)    Mild Urinary Retention   Past Medical History:  Diagnosis Date   Acute ischemic stroke ([place]) 04/29/2021   Acute medial meniscal tear 07/20/2014   Atrial fibrillation with RVR ([place]) 05/10/2021   Bronchitis, chronic (HCC)    Chronic headache 08/05/2013   COPD (chronic obstructive pulmonary disease) (HCC)    CVA (cerebral vascular accident) ([place]) 05/01/2021   Enlarged prostate    GERD (gastroesophageal reflux disease)    Glaucoma    Glucagonoma    Hernia, incisional    at present   [facility] (hard of hearing)    IBS (irritable bowel syndrome)    Incisional  hernia, without obstruction or gangrene 08/05/2013   Sciatic pain    Sinus congestion    TIA (transient ischemic attack) 04/27/2021    Current Outpatient Medications:    aspirin EC 81 MG tablet, Take 1 tablet (81 mg total) by mouth daily. Swallow whole., Disp: 30 tablet, Rfl: 11   Azelastine HCl 137 MCG/SPRAY SOLN, PLACE 1 SPRAY IN EACH NOSTRIL ONCE A DAY AS DIRECTED, Disp: 30 mL, Rfl: 5   BREO ELLIPTA 200-25 MCG/ACT AEPB, INHALE 1 PUFF DAILY AS DIRECTED, Disp: 60 each, Rfl: 5   Cholecalciferol (VITAMIN D3) 2000 UNITS TABS, Take 1 tablet by mouth daily., Disp: , Rfl:    CRANBERRY PO, Take by mouth., Disp: , Rfl:    Cyanocobalamin (VITAMIN B-12) 5000 MCG SUBL, Place 1 tablet under the tongue daily., Disp: , Rfl:    finasteride (PROSCAR) 5 MG tablet, Take 5 mg by mouth daily., Disp: , Rfl:    fluticasone (FLONASE) 50 MCG/ACT nasal spray, Place 2 sprays into both nostrils daily., Disp: 16 g, Rfl: 11   Fluticasone-Umeclidin-Vilant (TRELEGY ELLIPTA) 200-62.5-25 MCG/ACT AEPB, Inhale 1 puff into the lungs daily., Disp: 60 each, Rfl: 0   guaiFENesin (MUCINEX) 600 MG 12 hr tablet, Take 2 tablets (1,200 mg total) by mouth 2 (two) times daily as needed for to loosen phlegm or cough., Disp: , Rfl:    latanoprost (XALATAN) 0.005 % ophthalmic solution, Place 1  drop into the left eye at bedtime. (Patient taking differently: Place 1 drop into both eyes at bedtime.), Disp: 2.5 mL, Rfl: 1   pantoprazole (PROTONIX) 40 MG tablet, Take 1 tablet (40 mg total) by mouth 2 (two) times daily before a meal., Disp: 180 tablet, Rfl: 0   silodosin (RAPAFLO) 8 MG CAPS capsule, Take 8 mg by mouth daily., Disp: , Rfl:    timolol (TIMOPTIC) 0.5 % ophthalmic solution, Place 1 drop into the left eye every morning., Disp: , Rfl:    VENTOLIN HFA 108 (90 Base) MCG/ACT inhaler, Inhale 2 puffs into the lungs every 6 (six) hours as needed for wheezing or shortness of breath., Disp: 6.7 g, Rfl: 5   apixaban (ELIQUIS) 2.5 MG TABS tablet,  Take 1 tablet (2.5 mg total) by mouth 2 (two) times daily., Disp: 180 tablet, Rfl: 3   atorvastatin (LIPITOR) 40 MG tablet, Take 1 tablet (40 mg total) by mouth daily., Disp: 90 tablet, Rfl: 3 Social History   Socioeconomic History   Marital status: Married    Spouse name: Charles Golden   Number of children: 3   Years of education: 12+   Highest education level: Bachelor's degree (e.g., BA, AB, BS)  Occupational History   Occupation: Retired    Comment: Immunologist  Tobacco Use   Smoking status: Former    Packs/day: 1.00    Years: 30.00    Total pack years: 30.00    Types: Cigarettes    Quit date: 05/28/2006    Years since quitting: 15.6   Smokeless tobacco: Never  Vaping Use   Vaping Use: Never used  Substance and Sexual Activity   Alcohol use: Not Currently    Alcohol/week: 0.0 standard drinks of alcohol   Drug use: No   Sexual activity: Yes    Birth control/protection: None  Other Topics Concern   Not on file  Social History Narrative   Patient lives at home with [name].    Patient has 3 children.    Patient has his BS   Patient is retired.    Drinks about 2 cups of coffee per day.      [facility]:   He is still married. 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 He is from [place].       Social Determinants of Health   Financial Resource Strain: Low Risk  (02/09/2020)   Overall Financial Resource Strain (CARDIA)    Difficulty of Paying Living Expenses: Not hard at all  Food Insecurity: No Food Insecurity (01/01/2022)   Hunger Vital Sign    Worried About Running Out of Food in the Last Year: Never true    Ran Out of Food in the Last Year: Never true  Transportation Needs: No Transportation Needs (01/01/2022)   PRAPARE - Hydrologist (Medical): No    Lack of Transportation (Non-Medical): No  Physical Activity: Insufficiently Active (05/19/2021)    Exercise Vital Sign    Days of Exercise per Week: 1 day    Minutes of Exercise per Session: 10 min  Stress: Stress Concern Present (07/17/2021)   [place]    Feeling of Stress : To some extent  Social Connections: Socially Integrated (06/09/2019)   Social Connection and Isolation Panel [NHANES]    Frequency of Communication with Friends and Family: More than three times a week  Frequency of Social Gatherings with Friends and Family: More than three times a week    Attends Religious Services: More than 4 times per year    Active Member of [employer] or Organizations: Yes    Attends Music therapist: More than 4 times per year    Marital Status: Married  Human resources officer Violence: Not At Risk (01/01/2022)   Humiliation, Afraid, Rape, and Kick questionnaire    Fear of Current or Ex-Partner: No    Emotionally Abused: No    Physically Abused: No    Sexually Abused: No   Family History  Problem Relation Age of Onset   Heart disease Mother        Valve replacement and pacemaker   Colon cancer Father    Asthma Other        cousin   Healthy Daughter    Healthy Daughter    Healthy Daughter     Objective: Office vital signs reviewed. BP 127/69   Pulse 63   Temp (!) 97.5 F (36.4 C) (Temporal)   Resp 20   Ht '5\' 10"'$  (1.778 m)   Wt 161 lb (73 kg)   BMI 23.10 kg/m   Physical Examination:  General: Awake, alert, nontoxic male, No acute distress HEENT: Sclera white Cardio: Irregular mildly irregular with rate control, S1S2 heard, no murmurs appreciated Pulm: clear to auscultation bilaterally, no wheezes, rhonchi or rales; normal work of breathing on room air MSK: Ambulating independently.  Slightly shuffled gait with mildly hunched station.  Minimal assistance needed   Assessment/ Plan: 88 y.o. male   Longstanding persistent atrial fibrillation ([place]) - Plan: apixaban (ELIQUIS) 2.5 MG TABS tablet  Stage  3b chronic kidney disease ([place])  Chronic constipation  Physical deconditioning  COPD mixed type ([place]) - Plan: Fluticasone-Umeclidin-Vilant (TRELEGY ELLIPTA) 200-62.5-25 MCG/ACT AEPB  History of CVA in adulthood - Plan: atorvastatin (LIPITOR) 40 MG tablet  Rate controlled.  Eliquis due soon.  This has been renewed  He had been referred to renal at our last visit but has yet to be scheduled for an appointment.  I reached out to referral coordinator to check in on this and she will investigate further as it does appear that the information was received by that clinic.  I advised his daughter to supplement diet with kidney safe shakes like Nepro.  We discussed ways to improve chronic constipation including increasing physical activity.  I think this would help his physical deconditioning.  Patient did not feel that COPD was well controlled so we will trial on Trelegy.  Of note, he did have some mild urinary retention with the Spiriva previously in 2018.  He is to monitor for signs and symptoms of this and discontinue medication immediately if he develops.  We will CC his pulmonologist as FYI of trial.  He understands that he is to use this medication in place of Breo for the next several weeks if tolerated.  He has appointment with his pulmonologist in 2 weeks for checkup  Lipitor renewed  No orders of the defined types were placed in this encounter.  Meds ordered this encounter  Medications   atorvastatin (LIPITOR) 40 MG tablet    Sig: Take 1 tablet (40 mg total) by mouth daily.    Dispense:  90 tablet    Refill:  3   Fluticasone-Umeclidin-Vilant (TRELEGY ELLIPTA) 200-62.5-25 MCG/ACT AEPB    Sig: Inhale 1 puff into the lungs daily.    Dispense:  60 each    Refill:  0   apixaban (ELIQUIS) 2.5 MG TABS tablet    Sig: Take 1 tablet (2.5 mg total) by mouth 2 (two) times daily.    Dispense:  180 tablet    Refill:  [place], [place] [phone]

## 2022-01-24 NOTE — Patient Instructions (Signed)
Someone will reach out to you THIS WEEK regarding that appointment with Dr Theador Hawthorne.  I'm sorry to say I'm not sure what the hold up is on that one  We talked about MOVEMENT being the #1 thing you can do to strengthen AND help with bowel movements  I have given you a 1 month supply of Trelegy to try.  Use this INSTEAD of the Breo.  Use 1 puff ONCE daily.

## 2022-02-01 ENCOUNTER — Ambulatory Visit: Payer: Self-pay | Admitting: *Deleted

## 2022-02-01 NOTE — Patient Instructions (Signed)
Visit Information  Thank you for taking time to visit with me today. Please don't hesitate to contact me if I can be of assistance to you.   Following are the goals we discussed today:   Goals Addressed               This Visit's Progress     Patient Stated     Manage abdominal pain/AWV Charles Golden) (pt-stated)        Started 01/01/22  Care Coordination Interventions: Reviewed provider established plan for pain management Assessed social determinant of health barriers Reminded him to review Online education materials on hiatal hernia, gastritis, duodenitis, video on BPH (sent 01/01/22) Sent further my chart/online education on Irritable bowel syndrome (IBS), IBS diet and constipation         Our next appointment is by telephone on 02/15/22 at 1130  Please call the care guide team at [phone] if you need to cancel or reschedule your appointment.   If you are experiencing a Mental Health or [place] or need someone to talk to, please call the Suicide and Crisis Lifeline: 988 call the [place] National Suicide Prevention Lifeline: 816-670-7348 or TTY: 6626857578 TTY 815-775-4353) to talk to a trained counselor call 1-800-273-TALK (toll free, 24 hour hotline) call the [facility]: [phone] call 911   Patient verbalizes understanding of instructions and care plan provided today and agrees to view in [place]. Active MyChart status and patient understanding of how to access instructions and care plan via MyChart confirmed with patient.     The patient has been provided with contact information for the care management team and has been advised to call with any health related questions or concerns.   [place] Lavina Hamman, RN, BSN, [place] Coordinator Office number [phone]

## 2022-02-01 NOTE — Patient Outreach (Signed)
  Care Coordination   Follow Up Visit Note   02/01/2022 Name: Charles Golden MRN: [id number] DOB: [birth year]  Charles Golden is a 88 y.o. year old male who sees [name], DO for primary care. I spoke with  Charles Golden by phone today.  What matters to the patients health and wellness today?  GI symptoms remain about the same Not much improvement per patient He confirms he is taking his medicines as ordered Encouraged to review my chart for education   Remains Hard of hearing   Goals Addressed               This Visit's Progress     Patient Stated     Manage abdominal pain/AWV [facility]) (pt-stated)        Started 01/01/22  Care Coordination Interventions: Reviewed provider established plan for pain management Assessed social determinant of health barriers Reminded him to review Online education materials on hiatal hernia, gastritis, duodenitis, video on BPH (sent 01/01/22) Sent further my chart/online education on Irritable bowel syndrome (IBS), IBS diet and constipation         SDOH assessments and interventions completed:  No     Care Coordination Interventions Activated:  Yes  Care Coordination Interventions:  Yes, provided   Follow up plan: Follow up call scheduled for 02/15/22 1130    Encounter Outcome:  Pt. Visit Completed   Jalah [place] L. Lavina Hamman, RN, BSN, [place] Coordinator Office number [phone]

## 2022-02-06 ENCOUNTER — Encounter: Payer: Self-pay | Admitting: Pulmonary Disease

## 2022-02-06 ENCOUNTER — Ambulatory Visit (INDEPENDENT_AMBULATORY_CARE_PROVIDER_SITE_OTHER): Payer: Medicare Other | Admitting: Pulmonary Disease

## 2022-02-06 VITALS — BP 138/68 | HR 61 | Ht 70.0 in | Wt 162.6 lb

## 2022-02-06 DIAGNOSIS — J479 Bronchiectasis, uncomplicated: Secondary | ICD-10-CM

## 2022-02-06 DIAGNOSIS — J449 Chronic obstructive pulmonary disease, unspecified: Secondary | ICD-10-CM | POA: Diagnosis not present

## 2022-02-06 DIAGNOSIS — I639 Cerebral infarction, unspecified: Secondary | ICD-10-CM

## 2022-02-06 MED ORDER — TRELEGY ELLIPTA 100-62.5-25 MCG/ACT IN AEPB: 1.0000 | INHALATION_SPRAY | Freq: Every day | RESPIRATORY_TRACT | 5 refills | Status: DC

## 2022-02-06 NOTE — Patient Instructions (Signed)
Make sure you get your RSV, flu, and COVID vaccines in the next couple of months.  Will arrange for a new flutter valve.  You can use mucinex and flutter valve once or twice per day as needed to help with cough, chest congestion, and loosen phlegm.  Trelegy 1 puff daily, and rinse your mouth after each use.  Follow up in 6 months.

## 2022-02-06 NOTE — Progress Notes (Signed)
[facility]  Chief Complaint  Patient presents with   Follow-up    Wants to change breo to trelegy    Constitutional:  BP 138/68 (BP Location: Left Arm, Cuff Size: Normal)   Pulse 61   Ht '5\' 10"'$  (1.778 m)   Wt 162 lb 9.6 oz (73.8 kg)   SpO2 98%   BMI 23.33 kg/m   Past Medical History:  Sciatica, IBS, Glaucoma, Glucagonoma, GERD, BPH, CVA, Atrial fibrillation, CKD 3b  Past Surgical History:  He  has a past surgical history that includes Hemorrhoid surgery; laparotomy (N/A, 09/04/2012); Bowel resection (N/A, 09/04/2012); Inguinal hernia repair (Left, 09/14/2013); Insertion of mesh (Left, 09/14/2013); Tonsillectomy; Knee arthroscopy (Left, 07/21/2014); Skin lesion excision; Hernia repair (Bilateral, 70's); Inguinal hernia repair (Right, 12/23/2018); Radiology with anesthesia (Left, 05/04/2021); IR US Guide Vasc Access Right (05/04/2021); IR INTRAVSC STENT CERV CAROTID W/EMB-PROT MOD SED (05/04/2021); and IR ANGIO INTRA EXTRACRAN SEL INTERNAL CAROTID UNI L MOD SED (05/04/2021).  Brief Summary:  Charles Golden is a 88 y.o. male former smoker with BTX, COPD/emphysema and GERD.      Subjective:   He is here with his daughter.  He has cough with yellow sputum.  This comes up easily.  He uses mucinex daily.  Not sure where his flutter valve is.  Not having chest pain, wheeze, fever, sweats, or hemoptysis.  Sleeping okay.  Gets tired easily with activity.  His daughter says he doesn't do much activity at home.  He was recently changed to trelegy and feels this works better.  He maintained his SpO2 > 92% on room air while walking in office today.  Physical Exam:   Appearance - well kempt   ENMT - no sinus tenderness, no oral exudate, no LAN, Mallampati 3 airway, no stridor  Respiratory - scattered rhonchi  CV - s1s2 regular rate and rhythm, no murmurs  Ext - no clubbing, no edema  Skin - no rashes  Psych - normal mood and affect       Pulmonary testing:  A1AT 04/11/15 >> 153, MM PFT 07/13/15 >> FEV1 2.40 (92%), FEV1% 66, TLC 8.07 (112%), DLCO 75%, +BD  Chest Imaging:  CT chest 04/19/15 >> mild cylindrical BTX in bases, mild centrilobular emphysema, calcified mediastinal LN HRCT chest 03/14/21 >> diffuse bronchial thickening, scarring at bases  Sleep Tests:  PSG 11/15/16 >> AHI 3.6  Cardiac Tests:  Echo 04/28/21 >> EF 60 to 65%, grade 2 DD, mild/mod RV dilation  Social History:  He  reports that he quit smoking about 15 years ago. His smoking use included cigarettes. He has a 30.00 pack-year smoking history. He has never used smokeless tobacco. He reports that he does not currently use alcohol. He reports that he does not use drugs.  Family History:  His family history includes Asthma in an other family member; Colon cancer in his father; Healthy in his daughter, daughter, and daughter; Heart disease in his mother.     Assessment/Plan:   Bronchiectasis. - will arrange for new flutter valve - mucinex bid prn - prn albuterol - discussed symptoms to monitor for that would indicate he needs antibiotics  COPD with chronic bronchitis and emphysema. - trelegy 100 one puff daily - emphasized importance of getting RSV, influenza, and COVID vaccines    Allergic rhinitis. - prn azelastine, flonase   Time Spent Involved in Patient Care on Day of Examination:  37 minutes  Follow up:   Patient Instructions  Make  sure you get your RSV, flu, and COVID vaccines in the next couple of months.  Will arrange for a new flutter valve.  You can use mucinex and flutter valve once or twice per day as needed to help with cough, chest congestion, and loosen phlegm.  Trelegy 1 puff daily, and rinse your mouth after each use.  Follow up in 6 months.  Medication List:   Allergies as of 02/06/2022       Reactions   Demerol [meperidine] Other (See Comments)   Pt. States "he woke up during a colonoscopy"   Spiriva  Handihaler [tiotropium Bromide Monohydrate] Other (See Comments)   Mild Urinary Retention        Medication List        Accurate as of February 06, 2022 10:20 AM. If you have any questions, ask your nurse or doctor.          STOP taking these medications    Breo Ellipta 200-25 MCG/ACT Aepb Generic drug: fluticasone furoate-vilanterol Stopped by: [name], MD   Trelegy Ellipta 200-62.5-25 MCG/ACT Aepb Generic drug: Fluticasone-Umeclidin-Vilant Replaced by: Trelegy Ellipta 100-62.5-25 MCG/ACT Aepb Stopped by: [name], MD       TAKE these medications    apixaban 2.5 MG Tabs tablet Commonly known as: ELIQUIS Take 1 tablet (2.5 mg total) by mouth 2 (two) times daily.   aspirin EC 81 MG tablet Take 1 tablet (81 mg total) by mouth daily. Swallow whole.   atorvastatin 40 MG tablet Commonly known as: LIPITOR Take 1 tablet (40 mg total) by mouth daily.   Azelastine HCl 137 MCG/SPRAY Soln PLACE 1 SPRAY IN EACH NOSTRIL ONCE A DAY AS DIRECTED   CRANBERRY PO Take by mouth.   finasteride 5 MG tablet Commonly known as: PROSCAR Take 5 mg by mouth daily.   fluticasone 50 MCG/ACT nasal spray Commonly known as: FLONASE Place 2 sprays into both nostrils daily.   guaiFENesin 600 MG 12 hr tablet Commonly known as: MUCINEX Take 2 tablets (1,200 mg total) by mouth 2 (two) times daily as needed for to loosen phlegm or cough.   latanoprost 0.005 % ophthalmic solution Commonly known as: XALATAN Place 1 drop into the left eye at bedtime. What changed: how to take this   pantoprazole 40 MG tablet Commonly known as: PROTONIX Take 1 tablet (40 mg total) by mouth 2 (two) times daily before a meal.   silodosin 8 MG Caps capsule Commonly known as: RAPAFLO Take 8 mg by mouth daily.   sucralfate 1 g tablet Commonly known as: CARAFATE Take 1 g by mouth 4 (four) times daily.   timolol 0.5 % ophthalmic solution Commonly known as: TIMOPTIC Place 1 drop into the left eye  every morning.   Trelegy Ellipta 100-62.5-25 MCG/ACT Aepb Generic drug: Fluticasone-Umeclidin-Vilant Inhale 1 puff into the lungs daily in the afternoon. Replaces: Trelegy Ellipta 200-62.5-25 MCG/ACT Aepb Started by: [name], MD   Ventolin HFA 108 (90 Base) MCG/ACT inhaler Generic drug: albuterol Inhale 2 puffs into the lungs every 6 (six) hours as needed for wheezing or shortness of breath.   Vitamin B-12 5000 MCG Subl Place 1 tablet under the tongue daily.   Vitamin D3 50 MCG (2000 UT) Tabs Take 1 tablet by mouth daily.        Signature:  [name], MD [place] Pager - [phone] 02/06/2022, 10:20 AM

## 2022-02-12 ENCOUNTER — Ambulatory Visit: Payer: Medicare Other | Admitting: Neurology

## 2022-02-15 ENCOUNTER — Ambulatory Visit: Payer: Self-pay | Admitting: *Deleted

## 2022-02-15 NOTE — Patient Outreach (Signed)
  Care Coordination   Follow Up Visit Note   02/15/2022 Name: Charles Golden MRN: [id number] DOB: Oct 25, 1925  Charles Golden is a 88 y.o. year old male who sees Charles Norlander, DO for primary care. I spoke with  Charles Golden by phone today.  What matters to the patients health and wellness today? Reports he continues with the GI symptoms/ chronic constipation. They have not improved. He reports stopping "all the medicines" as they were not helping. He did not indicate changes with increased activity. He is being followed by Charles Golden and needs to make an appointment with him as pcp recommended. He states he can do this without assistance Has been seen by pulmonology on 02/06/22 Stopped Breo and is now on Trelegy  He is appreciative for the follow up outreach and being made aware of the flu vaccines being available    Goals Addressed   None     SDOH assessments and interventions completed:  No     Care Coordination Interventions Activated:  Yes  Care Coordination Interventions:  Yes, provided   Follow up plan: Follow up call scheduled for 03/19/22 1130    Encounter Outcome:  Pt. Visit Completed   Charles Pard[place] L. Lavina Hamman, RN, BSN, [place] Coordinator Office number [phone]

## 2022-02-15 NOTE — Patient Instructions (Signed)
Visit Information  Thank you for taking time to visit with me today. Please don't hesitate to contact me if I can be of assistance to you.   Following are the goals we discussed today:   Goals Addressed   None     Our next appointment is by telephone on 04/17/22 at 1130  Please call the care guide team at [phone] if you need to cancel or reschedule your appointment.   If you are experiencing a Mental Health or [place] or need someone to talk to, please call the Suicide and Crisis Lifeline: 988 call the [place] National Suicide Prevention Lifeline: 315-230-1922 or TTY: (810)547-6583 TTY (573)285-3999) to talk to a trained counselor call 1-800-273-TALK (toll free, 24 hour hotline) call the [facility]: [phone] call 911   Patient verbalizes understanding of instructions and care plan provided today and agrees to view in [place]. Active MyChart status and patient understanding of how to access instructions and care plan via MyChart confirmed with patient.     The patient has been provided with contact information for the care management team and has been advised to call with any health related questions or concerns.   [place] Lavina Hamman, RN, BSN, [place] Coordinator Office number [phone]

## 2022-03-13 ENCOUNTER — Other Ambulatory Visit: Payer: Self-pay | Admitting: Family Medicine

## 2022-03-19 ENCOUNTER — Encounter: Payer: Medicare Other | Admitting: *Deleted

## 2022-03-27 ENCOUNTER — Encounter: Payer: Self-pay | Admitting: Neurology

## 2022-03-27 ENCOUNTER — Ambulatory Visit (INDEPENDENT_AMBULATORY_CARE_PROVIDER_SITE_OTHER): Payer: Medicare Other | Admitting: Neurology

## 2022-03-27 VITALS — BP 152/72 | HR 59 | Ht 70.0 in | Wt 166.0 lb

## 2022-03-27 DIAGNOSIS — I6522 Occlusion and stenosis of left carotid artery: Secondary | ICD-10-CM

## 2022-03-27 DIAGNOSIS — I639 Cerebral infarction, unspecified: Secondary | ICD-10-CM

## 2022-03-27 DIAGNOSIS — Z8673 Personal history of transient ischemic attack (TIA), and cerebral infarction without residual deficits: Secondary | ICD-10-CM

## 2022-03-27 NOTE — Patient Instructions (Addendum)
I had a long d/w patient and his daughter about his recent stroke,carotid stenosis and stenting, new diagnosis of atrial fibrillation, risk for recurrent stroke/TIAs, personally independently reviewed imaging studies and stroke evaluation results and answered questions.Continue aspirin 81 mg daily and Eliquis (apixaban) daily  for secondary stroke prevention  given carotid stent and atrial fibrillation and maintain strict control of hypertension with blood pressure goal below 130/90, diabetes with hemoglobin A1c goal below 6.5% and lipids with LDL cholesterol goal below 70 mg/dL. I also advised the patient to eat a healthy diet with plenty of whole grains, cereals, fruits and vegetables, exercise regularly and maintain ideal body weight . Check carotid ultrasound for stent surveillance as per vascular surgery..Followup in the future with me only as needed and no scheduled appointment is  needed.

## 2022-03-27 NOTE — Progress Notes (Signed)
[facility] [address] [place]. [place] [address] [phone]       OFFICE FOLLOW-UP NOTE  Charles Golden Date of Birth:  03-09-1926 Medical Record Number:  [id number]   HPI: Charles Golden is a 88 year old pleasant Caucasian male seen today for initial office follow-up visit following hospital consultation for stroke in December 2022.  He is accompanied by his daughter.  History is obtained from them and review of electronic medical records and opossum reviewed pertinent available imaging films in PACS. Charles Golden is an 88 y.o. male presenting to the ED with a chief complaint of dysarthria and difficulty ambulating which suddenly started after he had attended the funeral of his wife. He was evaluated in triage and a Code Stroke was called. Symptoms started about 2 hours prior to presentation and resolved after about 10 minutes. He reported sitting in the car with his son, unable to speak any sentences, feeling like he was unable to find his words. He had difficulty ambulating after getting out of the car. Symptoms continued to be resolved in Triage and in CT after the Code Stroke was called.   CT head revealed no acute hemorrhage or hypodensity, but a dense left MCA sign was noted and CTA was then ordered, revealing a left ICA occlusion and left M2 occlusion. Patient has remained asymptomatic with a normal neurological exam while in the ED. patient's NIH stroke scale was 0 and she was not considered a candidate for urgent mechanical thrombectomy of carotid revascularization.  He was discharged home on aspirin and Plavix for 3 months and statin was added.  He however returned the next day on 04/29/2021 with recurrence of aphasia and difficulty walking and stumbling.  NIH stroke scale was again quite low at 1.  MRI scan of the brain showed 2 areas of acute infarction in the left frontal lobe the larger one being in the insula.  2D echo showed ejection fraction 60-65%.  LDL  cholesterol 50 mg percent.  Hemoglobin A1c was 5.8.  He was again counseled to avoid hypotension and discharged home.  He returned again later the next day with worsening symptoms of aphasia and speech difficulties.  His BP was again low and he was given IV fluids and his symptoms resolved as blood pressure came up.  He was monitored carefully and underwent elective left carotid angioplasty stenting by Dr. Norma Fredrickson on 05/04/2021.  Patient did well postprocedure and was discharged home.  He is currently living independently at home by himself.  One of his daughters lives across the street and the other 1 also in town and both of them check on him regularly.  He was readmitted a week later on December 14 with pneumonia and found to have new onset A-fib.  He was previously on aspirin and Brilinta this was changed to aspirin and Eliquis.  He is tolerating it well without only minor bruising and no bleeding episodes.  He states blood pressure has been running fairly good.  He is has had no recurrent stroke or TIA symptoms.  He remains quite independent and lives here in his own house.  The family has not noticed any memory or cognitive decline since his strokes.  He has no new complaints today. Update 03/27/2022 ; he returns for follow-up after last visit 7 months ago.  He is accompanied by his daughter.  He is doing well from a stroke standpoint without recurrent stroke or TIA symptoms.  Remains on aspirin and Eliquis is  tolerating them well without bleeding complications but does bruise easily.  He states his blood pressure is under good control though today it is elevated in office at 152/72.  Tolerating Lipitor well without side effects.  Did have a follow-up carotid ultrasound on 08/12/2018.  Vascular surgery office which showed less than 50% left ICA stent stenosis.  He has had recent complaints of burning in his stomach at night when he lies down more than 4 hours.  He has started taking Protonix has an upcoming  appointment with gastro enterologist for this.  He has no new complaints  ROS:   14 system review of systems is positive for speech difficulties, stomach burning, difficulty sleeping gait and balance difficulties all other systems negative  PMH:  Past Medical History:  Diagnosis Date   Acute medial meniscal tear 07/20/2014   Atrial fibrillation with RVR ([facility]) 05/10/2021   Chronic headache 08/05/2013   COPD (chronic obstructive pulmonary disease) (HCC)    CVA (cerebral vascular accident) ([facility]) 05/01/2021   Enlarged prostate    GERD (gastroesophageal reflux disease)    Glaucoma    Glucagonoma    Hernia, incisional    at present   [facility] (hard of hearing)    IBS (irritable bowel syndrome)    Incisional hernia, without obstruction or gangrene 08/05/2013   Sciatic pain    Stage 3b chronic kidney disease (CKD) ([facility])     Social History:  Social History   Socioeconomic History   Marital status: Married    Spouse name: Charles Golden   Number of children: 3   Years of education: 12+   Highest education level: Bachelor's degree (e.g., BA, AB, BS)  Occupational History   Occupation: Retired    Comment: Immunologist  Tobacco Use   Smoking status: Former    Packs/day: 1.00    Years: 30.00    Total pack years: 30.00    Types: Cigarettes    Quit date: 05/28/2006    Years since quitting: 15.8   Smokeless tobacco: Never  Vaping Use   Vaping Use: Never used  Substance and Sexual Activity   Alcohol use: Not Currently    Alcohol/week: 0.0 standard drinks of alcohol   Drug use: No   Sexual activity: Yes    Birth control/protection: None  Other Topics Concern   Not on file  Social History Narrative   Patient lives at home with [name].    Patient has 3 children.    Patient has his BS   Patient is retired.    Drinks about 2 cups of coffee per day.      [facility]:   He is still married. 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 He mostly  worked with synthetic fibers. He is from [place].       Social Determinants of Health   Financial Resource Strain: Low Risk  (02/09/2020)   Overall Financial Resource Strain (CARDIA)    Difficulty of Paying Living Expenses: Not hard at all  Food Insecurity: No Food Insecurity (01/01/2022)   Hunger Vital Sign    Worried About Running Out of Food in the Last Year: Never true    Ran Out of Food in the Last Year: Never true  Transportation Needs: No Transportation Needs (01/01/2022)   PRAPARE - Hydrologist (Medical): No    Lack of Transportation (Non-Medical): No  Physical Activity: Insufficiently Active (05/19/2021)   Exercise Vital Sign  Days of Exercise per Week: 1 day    Minutes of Exercise per Session: 10 min  Stress: Stress Concern Present (07/17/2021)   [place]    Feeling of Stress : To some extent  Social Connections: Socially Integrated (06/09/2019)   Social Connection and Isolation Panel [NHANES]    Frequency of Communication with Friends and Family: More than three times a week    Frequency of Social Gatherings with Friends and Family: More than three times a week    Attends Religious Services: More than 4 times per year    Active Member of [employer] or Organizations: Yes    Attends Music therapist: More than 4 times per year    Marital Status: Married  Human resources officer Violence: Not At Risk (01/01/2022)   Humiliation, Afraid, Rape, and Kick questionnaire    Fear of Current or Ex-Partner: No    Emotionally Abused: No    Physically Abused: No    Sexually Abused: No    Medications:   Current Outpatient Medications on File Prior to Visit  Medication Sig Dispense Refill   apixaban (ELIQUIS) 2.5 MG TABS tablet Take 1 tablet (2.5 mg total) by mouth 2 (two) times daily. 180 tablet 3   aspirin EC 81 MG tablet Take 1 tablet (81 mg total) by mouth daily. Swallow whole. 30  tablet 11   atorvastatin (LIPITOR) 40 MG tablet Take 1 tablet (40 mg total) by mouth daily. 90 tablet 3   Azelastine HCl 137 MCG/SPRAY SOLN PLACE 1 SPRAY IN EACH NOSTRIL ONCE A DAY AS DIRECTED 30 mL 5   Cholecalciferol (VITAMIN D3) 2000 UNITS TABS Take 1 tablet by mouth daily.     CRANBERRY PO Take by mouth.     Cyanocobalamin (VITAMIN B-12) 5000 MCG SUBL Place 1 tablet under the tongue daily.     finasteride (PROSCAR) 5 MG tablet Take 5 mg by mouth daily.     fluticasone (FLONASE) 50 MCG/ACT nasal spray Place 2 sprays into both nostrils daily. 16 g 11   Fluticasone-Umeclidin-Vilant (TRELEGY ELLIPTA) 100-62.5-25 MCG/ACT AEPB Inhale 1 puff into the lungs daily in the afternoon. 60 each 5   guaiFENesin (MUCINEX) 600 MG 12 hr tablet Take 2 tablets (1,200 mg total) by mouth 2 (two) times daily as needed for to loosen phlegm or cough.     latanoprost (XALATAN) 0.005 % ophthalmic solution Place 1 drop into the left eye at bedtime. (Patient taking differently: Place 1 drop into both eyes at bedtime.) 2.5 mL 1   pantoprazole (PROTONIX) 40 MG tablet TAKE 1 TABLET 2 TIMES A DAY BEFORE A MEAL 180 tablet 0   silodosin (RAPAFLO) 8 MG CAPS capsule Take 8 mg by mouth daily.     timolol (TIMOPTIC) 0.5 % ophthalmic solution Place 1 drop into the left eye every morning.     VENTOLIN HFA 108 (90 Base) MCG/ACT inhaler Inhale 2 puffs into the lungs every 6 (six) hours as needed for wheezing or shortness of breath. 6.7 g 5   No current facility-administered medications on file prior to visit.    Allergies:   Allergies  Allergen Reactions   Demerol [Meperidine] Other (See Comments)    Pt. States "he woke up during a colonoscopy"   Spiriva Handihaler [Tiotropium Bromide Monohydrate] Other (See Comments)    Mild Urinary Retention    Physical Exam General: Frail elderly Caucasian male, seated, in no evident distress Head: head normocephalic and atraumatic.  Neck:  supple with no carotid or supraclavicular  bruits Cardiovascular: regular rate and rhythm, no murmurs Musculoskeletal: no deformity Skin:  no rash/petichiae Vascular:  Normal pulses all extremities Vitals:   03/27/22 1327  BP: (!) 152/72  Pulse: (!) 59   Neurologic Exam Mental Status: Awake and fully alert. Oriented to place and time. Recent and remote memory intact. Attention span, concentration and fund of knowledge appropriate. Mood and affect appropriate.  Cranial Nerves: Fundoscopic exam not done. Pupils equal, briskly reactive to light. Extraocular movements full without nystagmus. Visual fields full to confrontation. Hearing diminished bilaterally. Facial sensation intact. Face, tongue, palate moves normally and symmetrically.  Motor: Normal bulk and tone. Normal strength in all tested extremity muscles. Sensory.: intact to touch ,pinprick .position and vibratory sensation.  Coordination: Rapid alternating movements normal in all extremities. Finger-to-nose and heel-to-shin performed accurately bilaterally. Gait and Station: Arises from chair without difficulty. Stance is normal. Gait demonstrates normal stride length and balance . Able to heel, toe and tandem walk with moderate difficulty.  Reflexes: 1+ and symmetric. Toes downgoing.       ASSESSMENT: 88 year old Caucasian male with a left MCA branch infarct in December 2022 secondary to high-grade proximal left carotid stenosis with distal embolization to the inferior division of the left M2.  After initial improvement had some neurological worsening and subsequently underwent left carotid revascularization with angioplasty stenting on 05/04/2021.  He was subsequently hospitalized a week later with pneumonia and found to have new onset A-fib.  Vascular risk factors of carotid stenosis, atrial fibrillation, hyperlipidemia and hypertension     PLAN: I had a long d/w patient and his daughter about his recent stroke,carotid stenosis and stenting, new diagnosis of atrial  fibrillation, risk for recurrent stroke/TIAs, personally independently reviewed imaging studies and stroke evaluation results and answered questions.Continue aspirin 81 mg daily and Eliquis (apixaban) daily  for secondary stroke prevention  given carotid stent and atrial fibrillation and maintain strict control of hypertension with blood pressure goal below 130/90, diabetes with hemoglobin A1c goal below 6.5% and lipids with LDL cholesterol goal below 70 mg/dL. I also advised the patient to eat a healthy diet with plenty of whole grains, cereals, fruits and vegetables, exercise regularly and maintain ideal body weight . Check carotid ultrasound for stent surveillance as per vascular surgery..Followup in the future with me only as needed and no scheduled appointment is  needed. Greater than 50% of time during this 40 minute visit was spent on counseling,explanation of diagnosis, planning of further management, discussion with patient and family and coordination of care Antony Contras, MD Note: This document was prepared with digital dictation and possible smart phrase technology. Any transcriptional errors that result from this process are unintentional

## 2022-03-29 DIAGNOSIS — K2950 Unspecified chronic gastritis without bleeding: Secondary | ICD-10-CM | POA: Diagnosis not present

## 2022-03-29 DIAGNOSIS — R1033 Periumbilical pain: Secondary | ICD-10-CM | POA: Diagnosis not present

## 2022-03-29 DIAGNOSIS — I739 Peripheral vascular disease, unspecified: Secondary | ICD-10-CM | POA: Diagnosis not present

## 2022-03-29 DIAGNOSIS — K2980 Duodenitis without bleeding: Secondary | ICD-10-CM | POA: Diagnosis not present

## 2022-04-04 DIAGNOSIS — Z23 Encounter for immunization: Secondary | ICD-10-CM | POA: Diagnosis not present

## 2022-04-12 ENCOUNTER — Encounter: Payer: Self-pay | Admitting: Family Medicine

## 2022-04-17 ENCOUNTER — Ambulatory Visit: Payer: Self-pay | Admitting: *Deleted

## 2022-04-17 DIAGNOSIS — R0989 Other specified symptoms and signs involving the circulatory and respiratory systems: Secondary | ICD-10-CM | POA: Diagnosis not present

## 2022-04-17 DIAGNOSIS — Z7982 Long term (current) use of aspirin: Secondary | ICD-10-CM | POA: Diagnosis not present

## 2022-04-17 DIAGNOSIS — Z8673 Personal history of transient ischemic attack (TIA), and cerebral infarction without residual deficits: Secondary | ICD-10-CM | POA: Diagnosis not present

## 2022-04-17 DIAGNOSIS — Z87891 Personal history of nicotine dependence: Secondary | ICD-10-CM | POA: Diagnosis not present

## 2022-04-17 DIAGNOSIS — E785 Hyperlipidemia, unspecified: Secondary | ICD-10-CM | POA: Diagnosis not present

## 2022-04-17 DIAGNOSIS — R1010 Upper abdominal pain, unspecified: Secondary | ICD-10-CM | POA: Diagnosis not present

## 2022-04-17 DIAGNOSIS — Z7901 Long term (current) use of anticoagulants: Secondary | ICD-10-CM | POA: Diagnosis not present

## 2022-04-17 NOTE — Patient Outreach (Signed)
  Care Coordination   04/17/2022 Name: Charles Golden MRN: [id number] DOB: 1926/03/13   Care Coordination Outreach Attempts:  An unsuccessful telephone outreach was attempted today to offer the patient information about available care coordination services as a benefit of their health plan.    Follow Up Plan:  Additional outreach attempts will be made to offer the patient care coordination information and services.   Encounter Outcome:  No Answer  Care Coordination Interventions Activated:  No   Care Coordination Interventions:  No, not indicated     Syeda Prickett L. Lavina Hamman, RN, BSN, [place] Coordinator Office number [phone]

## 2022-04-22 ENCOUNTER — Encounter: Payer: Self-pay | Admitting: Family Medicine

## 2022-04-27 ENCOUNTER — Ambulatory Visit: Payer: Self-pay | Admitting: *Deleted

## 2022-04-27 NOTE — Patient Outreach (Signed)
  Care Coordination   Follow Up Visit Note   04/27/2022 Name: Charles Golden MRN: [id number] DOB: Mar 25, 1926  Charles Golden is a 88 y.o. year old male who sees [name], DO for primary care. I spoke with  Charles Golden by phone today.  What matters to the patients health and wellness today?  Remains hard of hearing as confirmed by patient He discussed poor sleeping recently and his electricity being temporarily out  Reports improved GI concerns, less constipation with intake of Miralax  He inquired about his follow up appointments Reviewed his appointments to Nephrology on 04/28/22  Vascular surgery 04/30/22 Pcp on 05/29/22  Annual wellness visit completed   New concern is pain of knee & hip He suspected arthritis and interacted with his pcp via my chart to recommend Arthritis Tylenol  He reports this has helped his knee & hip pain  With assessment he confirms decreased activity He reports he does not go out of his home frequently and does not keep mobile in his home  He will try some leg lifts while sitting     Goals Addressed               This Visit's Progress     Patient Stated     COMPLETED: Manage abdominal pain/[facility]) (pt-stated)   On track     Started 01/01/22  Care Coordination Interventions: Reviewed provider established plan for pain management Discussed importance of adherence to all scheduled medical appointments Counseled on the importance of reporting any/all new or changed pain symptoms or management strategies to pain management provider Screening for signs and symptoms of depression related to chronic disease state  Assessed social determinant of health barriers Assessed for worsening or improved GI/constipation symptoms  Confirmed annual wellness visit completed Goal complete        Manage pain of knee/hip ([facility]) (pt-stated)   Not on track     Care Coordination Interventions: Reviewed provider established plan for pain management Discussed  importance of adherence to all scheduled medical appointments Counseled on the importance of reporting any/all new or changed pain symptoms or management strategies to pain management provider Advised patient to report to care team affect of pain on daily activities Discussed use of relaxation techniques and/or diversional activities to assist with pain reduction (distraction, imagery, relaxation, massage, acupressure, TENS, heat, and cold application Reviewed with patient prescribed pharmacological and nonpharmacological pain relief strategies Screening for signs and symptoms of depression related to chronic disease state  Assessed social determinant of health barriers Sent education information on arthritis, knee/hip pain via my chart         SDOH assessments and interventions completed:  No  SDOH Interventions Today    Flowsheet Row Most Recent Value  SDOH Interventions   Utilities Interventions Intervention Not Indicated        Care Coordination Interventions:  Yes, provided   Follow up plan: Follow up call scheduled for 06/05/22    Encounter Outcome:  Pt. Visit Completed

## 2022-04-27 NOTE — Patient Instructions (Addendum)
Visit Information  Thank you for taking time to visit with me today. Please don't hesitate to contact me if I can be of assistance to you.   Following are the goals we discussed today:   Goals Addressed               This Visit's Progress     Patient Stated     COMPLETED: Manage abdominal pain/AW[facility]) (pt-stated)   On track     Started 01/01/22  Care Coordination Interventions: Reviewed provider established plan for pain management Discussed importance of adherence to all scheduled medical appointments Counseled on the importance of reporting any/all new or changed pain symptoms or management strategies to pain management provider Screening for signs and symptoms of depression related to chronic disease state  Assessed social determinant of health barriers Assessed for worsening or improved GI/constipation symptoms  Confirmed annual wellness visit completed Goal complete        Manage pain of knee/hip ([facility]) (pt-stated)   Not on track     Care Coordination Interventions: Reviewed provider established plan for pain management Discussed importance of adherence to all scheduled medical appointments Counseled on the importance of reporting any/all new or changed pain symptoms or management strategies to pain management provider Advised patient to report to care team affect of pain on daily activities Discussed use of relaxation techniques and/or diversional activities to assist with pain reduction (distraction, imagery, relaxation, massage, acupressure, TENS, heat, and cold application Reviewed with patient prescribed pharmacological and nonpharmacological pain relief strategies Screening for signs and symptoms of depression related to chronic disease state  Assessed social determinant of health barriers Sent education information on arthritis, knee/hip pain via my chart         Our next appointment is by telephone on 06/05/22 at 1100  Please call the care guide team at  [phone] if you need to cancel or reschedule your appointment.   If you are experiencing a Mental Health or [place] or need someone to talk to, please call the Suicide and Crisis Lifeline: 988 call the [place] National Suicide Prevention Lifeline: 202-655-5592 or TTY: (609) 887-2681 TTY 207-162-1047) to talk to a trained counselor call 1-800-273-TALK (toll free, 24 hour hotline) call the [facility]: 2046704772 call 911   Patient verbalizes understanding of instructions and care plan provided today and agrees to view in [place]. Active MyChart status and patient understanding of how to access instructions and care plan via MyChart confirmed with patient.     The patient has been provided with contact information for the care management team and has been advised to call with any health related questions or concerns.   Guis[place] Flanagan L. Lavina Hamman, RN, BSN, [place] Coordinator Office number [phone]

## 2022-04-28 DIAGNOSIS — I739 Peripheral vascular disease, unspecified: Secondary | ICD-10-CM | POA: Diagnosis not present

## 2022-04-28 DIAGNOSIS — I129 Hypertensive chronic kidney disease with stage 1 through stage 4 chronic kidney disease, or unspecified chronic kidney disease: Secondary | ICD-10-CM | POA: Diagnosis not present

## 2022-04-28 DIAGNOSIS — N1831 Chronic kidney disease, stage 3a: Secondary | ICD-10-CM | POA: Diagnosis not present

## 2022-04-28 DIAGNOSIS — I480 Paroxysmal atrial fibrillation: Secondary | ICD-10-CM | POA: Diagnosis not present

## 2022-04-28 DIAGNOSIS — N200 Calculus of kidney: Secondary | ICD-10-CM | POA: Diagnosis not present

## 2022-04-30 ENCOUNTER — Other Ambulatory Visit (HOSPITAL_COMMUNITY): Payer: Self-pay | Admitting: Nephrology

## 2022-04-30 DIAGNOSIS — I129 Hypertensive chronic kidney disease with stage 1 through stage 4 chronic kidney disease, or unspecified chronic kidney disease: Secondary | ICD-10-CM

## 2022-04-30 DIAGNOSIS — N1831 Chronic kidney disease, stage 3a: Secondary | ICD-10-CM

## 2022-04-30 DIAGNOSIS — N200 Calculus of kidney: Secondary | ICD-10-CM

## 2022-04-30 DIAGNOSIS — K551 Chronic vascular disorders of intestine: Secondary | ICD-10-CM | POA: Diagnosis not present

## 2022-05-01 ENCOUNTER — Encounter: Payer: Self-pay | Admitting: Family Medicine

## 2022-05-01 NOTE — Telephone Encounter (Signed)
Yes, glad to complete for him.  If you'll fill it out I'll sign it, Charles Golden

## 2022-05-07 ENCOUNTER — Ambulatory Visit (HOSPITAL_COMMUNITY)
Admission: RE | Admit: 2022-05-07 | Discharge: 2022-05-07 | Disposition: A | Discharge status: Home / Self Care | Payer: Medicare Other | Source: Ambulatory Visit | Attending: Nephrology | Admitting: Nephrology

## 2022-05-07 DIAGNOSIS — N1831 Chronic kidney disease, stage 3a: Secondary | ICD-10-CM | POA: Insufficient documentation

## 2022-05-07 DIAGNOSIS — N189 Chronic kidney disease, unspecified: Secondary | ICD-10-CM | POA: Diagnosis not present

## 2022-05-07 DIAGNOSIS — Z1159 Encounter for screening for other viral diseases: Secondary | ICD-10-CM | POA: Diagnosis not present

## 2022-05-07 DIAGNOSIS — I480 Paroxysmal atrial fibrillation: Secondary | ICD-10-CM | POA: Diagnosis not present

## 2022-05-07 DIAGNOSIS — N3289 Other specified disorders of bladder: Secondary | ICD-10-CM | POA: Diagnosis not present

## 2022-05-07 DIAGNOSIS — N281 Cyst of kidney, acquired: Secondary | ICD-10-CM | POA: Diagnosis not present

## 2022-05-07 DIAGNOSIS — N200 Calculus of kidney: Secondary | ICD-10-CM | POA: Diagnosis not present

## 2022-05-07 DIAGNOSIS — Z79899 Other long term (current) drug therapy: Secondary | ICD-10-CM | POA: Diagnosis not present

## 2022-05-07 DIAGNOSIS — I129 Hypertensive chronic kidney disease with stage 1 through stage 4 chronic kidney disease, or unspecified chronic kidney disease: Secondary | ICD-10-CM | POA: Insufficient documentation

## 2022-05-07 DIAGNOSIS — Z131 Encounter for screening for diabetes mellitus: Secondary | ICD-10-CM | POA: Diagnosis not present

## 2022-05-10 ENCOUNTER — Encounter: Payer: Self-pay | Admitting: Family Medicine

## 2022-05-29 ENCOUNTER — Encounter: Payer: Self-pay | Admitting: Family Medicine

## 2022-05-29 ENCOUNTER — Ambulatory Visit (INDEPENDENT_AMBULATORY_CARE_PROVIDER_SITE_OTHER): Payer: Medicare Other | Admitting: Family Medicine

## 2022-05-29 VITALS — BP 106/61 | HR 77 | Temp 97.7°F | Ht 70.0 in | Wt 159.8 lb

## 2022-05-29 DIAGNOSIS — J449 Chronic obstructive pulmonary disease, unspecified: Secondary | ICD-10-CM | POA: Diagnosis not present

## 2022-05-29 DIAGNOSIS — N1832 Chronic kidney disease, stage 3b: Secondary | ICD-10-CM

## 2022-05-29 DIAGNOSIS — I4811 Longstanding persistent atrial fibrillation: Secondary | ICD-10-CM

## 2022-05-29 DIAGNOSIS — R5381 Other malaise: Secondary | ICD-10-CM

## 2022-05-29 DIAGNOSIS — H6123 Impacted cerumen, bilateral: Secondary | ICD-10-CM

## 2022-05-29 DIAGNOSIS — Z974 Presence of external hearing-aid: Secondary | ICD-10-CM

## 2022-05-29 NOTE — Patient Instructions (Signed)
HYDRATE WALK DAILY AVOID Excedrin use.

## 2022-05-29 NOTE — Progress Notes (Signed)
Subjective: Charles PCP: Janora Golden, Charles Golden is a 89 y.o. male presenting to clinic today for:  1. Afib Reports compliance with all medications.  No reports of bleeding.  He continues to have easy fatigability and feels like he had more energy but admits that he does not stay physically active.  2. COPD Liked Trelegy and was switched to '100mg'$  in Sept. he feels like this is working well and has not had any issues with this medication  3. IRW4R Saw Dr Theador Hawthorne in December. Labs were stable.  Not retaining any fluid.  Urine output is baseline.  He admits he continues not to hydrate well.   ROS: Per HPI  Allergies  Allergen Reactions   Demerol [Meperidine] Other (See Comments)    Pt. States "he woke up during a colonoscopy"   Spiriva Handihaler [Tiotropium Bromide Monohydrate] Other (See Comments)    Mild Urinary Retention   Past Medical History:  Diagnosis Date   Acute medial meniscal tear 07/20/2014   Atrial fibrillation with RVR ([place]) 05/10/2021   Chronic headache 08/05/2013   COPD (chronic obstructive pulmonary disease) (HCC)    CVA (cerebral vascular accident) ([place]) 05/01/2021   Enlarged prostate    GERD (gastroesophageal reflux disease)    Glaucoma    Glucagonoma    Hernia, incisional    at present   [facility] (hard of hearing)    IBS (irritable bowel syndrome)    Incisional hernia, without obstruction or gangrene 08/05/2013   Sciatic pain    Stage 3b chronic kidney disease (CKD) (HCC)     Current Outpatient Medications:    apixaban (ELIQUIS) 2.5 MG TABS tablet, Take 1 tablet (2.5 mg total) by mouth 2 (two) times daily., Disp: 180 tablet, Rfl: 3   aspirin EC 81 MG tablet, Take 1 tablet (81 mg total) by mouth daily. Swallow whole., Disp: 30 tablet, Rfl: 11   atorvastatin (LIPITOR) 40 MG tablet, Take 1 tablet (40 mg total) by mouth daily., Disp: 90 tablet, Rfl: 3   Azelastine HCl 137 MCG/SPRAY SOLN, PLACE 1 SPRAY IN EACH NOSTRIL ONCE A DAY AS  DIRECTED, Disp: 30 mL, Rfl: 5   Cholecalciferol (VITAMIN D3) 2000 UNITS TABS, Take 1 tablet by mouth daily., Disp: , Rfl:    CRANBERRY PO, Take by mouth., Disp: , Rfl:    Cyanocobalamin (VITAMIN B-12) 5000 MCG SUBL, Place 1 tablet under the tongue daily., Disp: , Rfl:    finasteride (PROSCAR) 5 MG tablet, Take 5 mg by mouth daily., Disp: , Rfl:    fluticasone (FLONASE) 50 MCG/ACT nasal spray, Place 2 sprays into both nostrils daily., Disp: 16 g, Rfl: 11   Fluticasone-Umeclidin-Vilant (TRELEGY ELLIPTA) 100-62.5-25 MCG/ACT AEPB, Inhale 1 puff into the lungs daily in the afternoon., Disp: 60 each, Rfl: 5   guaiFENesin (MUCINEX) 600 MG 12 hr tablet, Take 2 tablets (1,200 mg total) by mouth 2 (two) times daily as needed for to loosen phlegm or cough., Disp: , Rfl:    latanoprost (XALATAN) 0.005 % ophthalmic solution, Place 1 drop into the left eye at bedtime. (Patient taking differently: Place 1 drop into both eyes at bedtime.), Disp: 2.5 mL, Rfl: 1   pantoprazole (PROTONIX) 40 MG tablet, TAKE 1 TABLET 2 TIMES A DAY BEFORE A MEAL, Disp: 180 tablet, Rfl: 0   silodosin (RAPAFLO) 8 MG CAPS capsule, Take 8 mg by mouth daily., Disp: , Rfl:    timolol (TIMOPTIC) 0.5 % ophthalmic solution, Place 1 drop into the left eye  every morning., Disp: , Rfl:    VENTOLIN HFA 108 (90 Base) MCG/ACT inhaler, Inhale 2 puffs into the lungs every 6 (six) hours as needed for wheezing or shortness of breath., Disp: 6.7 g, Rfl: 5 Social History   Socioeconomic History   Marital status: Married    Spouse name: [name]   Number of children: 3   Years of education: 12+   Highest education level: Bachelor's degree (e.g., BA, AB, BS)  Occupational History   Occupation: Retired    Comment: Immunologist  Tobacco Use   Smoking status: Former    Packs/day: 1.00    Years: 30.00    Total pack years: 30.00    Types: Cigarettes    Quit date: 05/28/2006    Years since quitting: 16.0   Smokeless tobacco: Never  Vaping Use    Vaping Use: Never used  Substance and Sexual Activity   Alcohol use: Not Currently    Alcohol/week: 0.0 standard drinks of alcohol   Drug use: No   Sexual activity: Yes    Birth control/protection: None  Other Topics Concern   Not on file  Social History Narrative   Patient lives at home with [name].    Patient has 3 children.    Patient has his BS   Patient is retired.    Drinks about 2 cups of coffee per day.      [facility]:   He is still married. 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 He is from [place].       Social Determinants of Health   Financial Resource Strain: Low Risk  (02/09/2020)   Overall Financial Resource Strain (CARDIA)    Difficulty of Paying Living Expenses: Not hard at all  Food Insecurity: No Food Insecurity (01/01/2022)   Hunger Vital Sign    Worried About Running Out of Food in the Last Year: Never true    Ran Out of Food in the Last Year: Never true  Transportation Needs: No Transportation Needs (01/01/2022)   PRAPARE - Hydrologist (Medical): No    Lack of Transportation (Non-Medical): No  Physical Activity: Insufficiently Active (05/19/2021)   Exercise Vital Sign    Days of Exercise per Week: 1 day    Minutes of Exercise per Session: 10 min  Stress: Stress Concern Present (07/17/2021)   [place]    Feeling of Stress : To some extent  Social Connections: Socially Integrated (06/09/2019)   Social Connection and Isolation Panel [NHANES]    Frequency of Communication with Friends and Family: More than three times a week    Frequency of Social Gatherings with Friends and Family: More than three times a week    Attends Religious Services: More than 4 times per year    Active Member of [employer] or Organizations: Yes    Attends Music therapist: More than 4 times  per year    Marital Status: Married  Human resources officer Violence: Not At Risk (01/01/2022)   Humiliation, Afraid, Rape, and Kick questionnaire    Fear of Current or Ex-Partner: No    Emotionally Abused: No    Physically Abused: No    Sexually Abused: No   Family History  Problem Relation Age of Onset   Heart disease Mother        Valve replacement and pacemaker   Colon cancer  Father    Asthma Other        cousin   Healthy Daughter    Healthy Daughter    Healthy Daughter     Objective: Office vital signs reviewed. BP 106/61   Pulse 77   Temp 97.7 F (36.5 C) (Temporal)   Ht '5\' 10"'$  (1.778 m)   Wt 159 lb 12.8 oz (72.5 kg)   SpO2 95%   BMI 22.93 kg/m   Physical Examination:  General: Awake, alert, well nourished, No acute distress HEENT: Wears hearing aids.  Sclera white.  Bilateral cerumen. Cardio: Irregularly irregular with rate controlled.  S1S2 heard, no murmurs appreciated Pulm: clear to auscultation bilaterally, no wheezes, rhonchi or rales; normal work of breathing on room air   Assessment/ Plan: 89 y.o. male   Longstanding persistent atrial fibrillation (HCC)  Stage 3b chronic kidney disease ([place])  Physical deconditioning  COPD mixed type ([place])  Bilateral impacted cerumen  Hearing aid worn  A-fib is rate controlled.  No changes.  I reviewed his last follow-up with nephrology.  GFR in the 3B stage.  Continue to follow-up as directed and I encouraged p.o. hydration  We discussed staying physically active in efforts to stay strong.  He is tolerating the Trelegy 100 without difficulty.  No changes  Bilateral impacted cerumen was successfully irrigated ear on 1 side.  He was encouraged to use Debrox on the other  No orders of the defined types were placed in this encounter.  No orders of the defined types were placed in this encounter.    [name], Charles [place] [phone]

## 2022-05-31 ENCOUNTER — Encounter: Payer: Self-pay | Admitting: Family

## 2022-05-31 ENCOUNTER — Telehealth: Payer: Self-pay | Admitting: Family Medicine

## 2022-05-31 ENCOUNTER — Telehealth (INDEPENDENT_AMBULATORY_CARE_PROVIDER_SITE_OTHER): Payer: Medicare Other | Admitting: Family

## 2022-05-31 DIAGNOSIS — U071 COVID-19: Secondary | ICD-10-CM

## 2022-05-31 DIAGNOSIS — J449 Chronic obstructive pulmonary disease, unspecified: Secondary | ICD-10-CM | POA: Diagnosis not present

## 2022-05-31 MED ORDER — MOLNUPIRAVIR EUA 200MG CAPSULE: 4.0000 | ORAL_CAPSULE | Freq: Two times a day (BID) | ORAL | 0 refills | Status: AC

## 2022-05-31 NOTE — Progress Notes (Signed)
Virtual Visit Consent   Charles Golden, you are scheduled for a virtual visit with a [facility] provider today. Just as with appointments in the office, your consent must be obtained to participate. Your consent will be active for this visit and any virtual visit you may have with one of our providers in the next 365 days. If you have a MyChart account, a copy of this consent can be sent to you electronically.  As this is a virtual visit, video technology does not allow for your provider to perform a traditional examination. This may limit your provider's ability to fully assess your condition. If your provider identifies any concerns that need to be evaluated in person or the need to arrange testing (such as labs, EKG, etc.), we will make arrangements to do so. Although advances in technology are sophisticated, we cannot ensure that it will always work on either your end or our end. If the connection with a video visit is poor, the visit may have to be switched to a telephone visit. With either a video or telephone visit, we are not always able to ensure that we have a secure connection.  By engaging in this virtual visit, you consent to the provision of healthcare and authorize for your insurance to be billed (if applicable) for the services provided during this visit. Depending on your insurance coverage, you may receive a charge related to this service.  I need to obtain your verbal consent now. Are you willing to proceed with your visit today? Charles Golden has provided verbal consent on 05/31/2022 for a virtual visit (video or telephone). Charles Dun, FNP  Date: 05/31/2022 2:58 PM  Virtual Visit via Video Note   I, Charles Golden, connected with  Charles Golden  ([id number], 09/11/1925) on 05/31/22 at 12:10 PM EST by a video-enabled telemedicine application and verified that I am speaking with the correct person using two identifiers.  Location: Patient: Virtual Visit Location Patient:  Home Provider: Virtual Visit Location Provider: Home Office   I discussed the limitations of evaluation and management by telemedicine and the availability of in person appointments. The patient expressed understanding and agreed to proceed.    History of Present Illness: Charles Golden is a 89 y.o. who identifies as a male who was assigned male at birth, and is being seen today for COVID. His symptoms started two days ago and tested positive today. He has COPD.  HPI: URI  This is a new problem. The current episode started in the past 7 days. The problem has been unchanged. There has been no fever. Associated symptoms include congestion, ear pain, headaches, joint pain and rhinorrhea. Pertinent negatives include no coughing, sneezing or sore throat. He has tried decongestant and acetaminophen for the symptoms. The treatment provided mild relief.    Problems:  Patient Active Problem List   Diagnosis Date Noted   Atrial fibrillation ([place]) 05/10/2021   Cerebrovascular accident (CVA) due to vascular stenosis ([place]) 05/01/2021   Stenosis of left carotid artery    Chronic tension-type headache, not intractable 12/13/2020   Bilateral sensorineural hearing loss 09/25/2019   Bronchiectasis without complication ([place]) 40/98/1191   Vitamin D deficiency 01/21/2019   Weakness 01/21/2019   Recurrent right inguinal hernia    Presbycusis of both ears 07/19/2017   Restless leg syndrome 11/25/2016   Collagenous colitis 07/24/2016   Deviated nasal septum 09/03/2015   Basal cell carcinoma of nose 09/03/2015   Thoracic aortic atherosclerosis ([place]) 05/03/2015  GERD without esophagitis 04/11/2015   COPD GOLD I  12/08/2013   Small bowel volvulus ([place]) 08/05/2013   Left inguinal hernia 08/05/2013   Obstructive chronic bronchitis without exacerbation ([place]) 04/30/2013   Left hip pain 04/29/2013   BPH (benign prostatic hyperplasia) 01/05/2013   IBS (irritable bowel syndrome) 09/03/2012   Chronic  rhinosinusitis 09/03/2012   Lung nodules 05/04/2003    Allergies:  Allergies  Allergen Reactions   Demerol [Meperidine] Other (See Comments)    Pt. States "he woke up during a colonoscopy"   Spiriva Handihaler [Tiotropium Bromide Monohydrate] Other (See Comments)    Mild Urinary Retention   Medications:  Current Outpatient Medications:    molnupiravir EUA (LAGEVRIO) 200 mg CAPS capsule, Take 4 capsules (800 mg total) by mouth 2 (two) times daily for 5 days., Disp: 40 capsule, Rfl: 0   apixaban (ELIQUIS) 2.5 MG TABS tablet, Take 1 tablet (2.5 mg total) by mouth 2 (two) times daily., Disp: 180 tablet, Rfl: 3   aspirin EC 81 MG tablet, Take 1 tablet (81 mg total) by mouth daily. Swallow whole., Disp: 30 tablet, Rfl: 11   atorvastatin (LIPITOR) 40 MG tablet, Take 1 tablet (40 mg total) by mouth daily., Disp: 90 tablet, Rfl: 3   Azelastine HCl 137 MCG/SPRAY SOLN, PLACE 1 SPRAY IN EACH NOSTRIL ONCE A DAY AS DIRECTED, Disp: 30 mL, Rfl: 5   Cholecalciferol (VITAMIN D3) 2000 UNITS TABS, Take 1 tablet by mouth daily., Disp: , Rfl:    CRANBERRY PO, Take by mouth., Disp: , Rfl:    Cyanocobalamin (VITAMIN B-12) 5000 MCG SUBL, Place 1 tablet under the tongue daily., Disp: , Rfl:    finasteride (PROSCAR) 5 MG tablet, Take 5 mg by mouth daily., Disp: , Rfl:    fluticasone (FLONASE) 50 MCG/ACT nasal spray, Place 2 sprays into both nostrils daily., Disp: 16 g, Rfl: 11   Fluticasone-Umeclidin-Vilant (TRELEGY ELLIPTA) 100-62.5-25 MCG/ACT AEPB, Inhale 1 puff into the lungs daily in the afternoon., Disp: 60 each, Rfl: 5   guaiFENesin (MUCINEX) 600 MG 12 hr tablet, Take 2 tablets (1,200 mg total) by mouth 2 (two) times daily as needed for to loosen phlegm or cough., Disp: , Rfl:    latanoprost (XALATAN) 0.005 % ophthalmic solution, Place 1 drop into the left eye at bedtime. (Patient taking differently: Place 1 drop into both eyes at bedtime.), Disp: 2.5 mL, Rfl: 1   pantoprazole (PROTONIX) 40 MG tablet, TAKE 1  TABLET 2 TIMES A DAY BEFORE A MEAL, Disp: 180 tablet, Rfl: 0   silodosin (RAPAFLO) 8 MG CAPS capsule, Take 8 mg by mouth daily., Disp: , Rfl:    timolol (TIMOPTIC) 0.5 % ophthalmic solution, Place 1 drop into the left eye every morning., Disp: , Rfl:    VENTOLIN HFA 108 (90 Base) MCG/ACT inhaler, Inhale 2 puffs into the lungs every 6 (six) hours as needed for wheezing or shortness of breath., Disp: 6.7 g, Rfl: 5  Observations/Objective: Patient is well-developed, well-nourished in no acute distress.  Resting comfortably  at home.  Head is normocephalic, atraumatic.  No labored breathing.  Speech is clear and coherent with logical content.  Patient is alert and oriented at baseline.  HOH, daughter at bedside   Assessment and Plan: 1. COVID-19 - molnupiravir EUA (LAGEVRIO) 200 mg CAPS capsule; Take 4 capsules (800 mg total) by mouth 2 (two) times daily for 5 days.  Dispense: 40 capsule; Refill: 0  2. COPD GOLD I   COVID positive, rest, force fluids, tylenol  as needed, Quarantine for at least 5 days and you are fever free, then must wear a mask out in public from day 7-19, report any worsening symptoms such as increased shortness of breath, swelling, or continued high fevers. Possible adverse effects discussed with antivirals.    Follow Up Instructions: I discussed the assessment and treatment plan with the patient. The patient was provided an opportunity to ask questions and all were answered. The patient agreed with the plan and demonstrated an understanding of the instructions.  A copy of instructions were sent to the patient via MyChart unless otherwise noted below.    The patient was advised to call back or seek an in-person evaluation if the symptoms worsen or if the condition fails to improve as anticipated.  Time:  I spent 7 minutes with the patient via telehealth technology discussing the above problems/concerns.    Charles Dun, FNP

## 2022-05-31 NOTE — Telephone Encounter (Signed)
Patient seen 1/3 and was offered to be tested while in office because he spoke to provider abouy symptoms but refused. Tested positive for covid 1/4 and would like something to be called in. Please call back.

## 2022-05-31 NOTE — Telephone Encounter (Signed)
Called and appointment made

## 2022-05-31 NOTE — Patient Instructions (Signed)

## 2022-05-31 NOTE — Telephone Encounter (Signed)
Please schedule patient a video with me today. I sent a MyChart visits back to pools already [place].

## 2022-06-01 ENCOUNTER — Encounter: Payer: Self-pay | Admitting: Family Medicine

## 2022-06-04 ENCOUNTER — Telehealth: Payer: Self-pay | Admitting: *Deleted

## 2022-06-04 NOTE — Progress Notes (Signed)
  Care Coordination Note  06/04/2022 Name: JAHARI BILLY MRN: [id number] DOB: 1926-04-28  RUSELL MENEELY is a 89 y.o. year old male who is a primary care patient of [name], DO and is actively engaged with the care management team. I reached out to [name] by phone today to assist with re-scheduling a follow up visit with the RN Case Manager  Follow up plan: Unsuccessful telephone outreach attempt made. A HIPAA compliant phone message was left for the patient providing contact information and requesting a return call.   [place]  Direct Dial: [phone]

## 2022-06-04 NOTE — Progress Notes (Signed)
  Care Coordination Note  06/04/2022 Name: MARCELLA DUNNAWAY MRN: [id number] DOB: 13-Jul-1925  SARA SELVIDGE is a 89 y.o. year old male who is a primary care patient of [name], DO and is actively engaged with the care management team. I reached out to [name] by phone today to assist with re-scheduling a follow up visit with the RN Case Manager  Follow up plan: Telephone appointment with care management team member scheduled for:06/27/22  [place]  Direct Dial: [phone]

## 2022-06-05 ENCOUNTER — Encounter: Payer: Self-pay | Admitting: *Deleted

## 2022-06-07 DIAGNOSIS — R7303 Prediabetes: Secondary | ICD-10-CM | POA: Diagnosis not present

## 2022-06-07 DIAGNOSIS — D638 Anemia in other chronic diseases classified elsewhere: Secondary | ICD-10-CM | POA: Diagnosis not present

## 2022-06-07 DIAGNOSIS — N200 Calculus of kidney: Secondary | ICD-10-CM | POA: Diagnosis not present

## 2022-06-07 DIAGNOSIS — N281 Cyst of kidney, acquired: Secondary | ICD-10-CM | POA: Diagnosis not present

## 2022-06-07 DIAGNOSIS — D72829 Elevated white blood cell count, unspecified: Secondary | ICD-10-CM | POA: Diagnosis not present

## 2022-06-07 DIAGNOSIS — N1832 Chronic kidney disease, stage 3b: Secondary | ICD-10-CM | POA: Diagnosis not present

## 2022-06-08 ENCOUNTER — Other Ambulatory Visit: Payer: Self-pay | Admitting: Family Medicine

## 2022-06-20 ENCOUNTER — Telehealth: Payer: Self-pay | Admitting: Pulmonary Disease

## 2022-06-20 NOTE — Telephone Encounter (Signed)
PT calling and he has been sick a mo. Asks if he can come in earlier than his FU appt. To see Dr. Halford Chessman. No Acute slots avail. Please call to adv. He is 89 : ).  Cough Some Productive cough   [phone]

## 2022-06-20 NOTE — Telephone Encounter (Signed)
Called and spoke with pt and have scheduled him next available appt with APP as they had a sooner opening than VS. Nothing further needed.

## 2022-06-25 ENCOUNTER — Encounter: Payer: Self-pay | Admitting: Family Medicine

## 2022-06-26 ENCOUNTER — Encounter: Payer: Self-pay | Admitting: Family Medicine

## 2022-06-27 ENCOUNTER — Telehealth: Payer: Self-pay

## 2022-06-27 NOTE — Patient Outreach (Signed)
  Care Coordination   06/27/2022 Name: IREN WHIPP MRN: [id number] DOB: Apr 11, 1926   Care Coordination Outreach Attempts:  An unsuccessful telephone outreach was attempted today to offer the patient information about available care coordination services as a benefit of their health plan.   Follow Up Plan:  Additional outreach attempts will be made to offer the patient care coordination information and services.   Encounter Outcome:  No Answer   Care Coordination Interventions:  No, not indicated    Lazaro Arms RN, BSN, [facility]   Phone: [phone]

## 2022-07-02 ENCOUNTER — Ambulatory Visit (INDEPENDENT_AMBULATORY_CARE_PROVIDER_SITE_OTHER): Payer: Medicare Other | Admitting: Nurse Practitioner

## 2022-07-02 ENCOUNTER — Ambulatory Visit (INDEPENDENT_AMBULATORY_CARE_PROVIDER_SITE_OTHER): Payer: Medicare Other

## 2022-07-02 ENCOUNTER — Encounter: Payer: Self-pay | Admitting: Nurse Practitioner

## 2022-07-02 ENCOUNTER — Encounter: Payer: Self-pay | Admitting: Family Medicine

## 2022-07-02 ENCOUNTER — Other Ambulatory Visit: Payer: Medicare Other

## 2022-07-02 VITALS — BP 110/74 | HR 66 | Ht 70.0 in | Wt 158.6 lb

## 2022-07-02 DIAGNOSIS — J449 Chronic obstructive pulmonary disease, unspecified: Secondary | ICD-10-CM | POA: Diagnosis not present

## 2022-07-02 DIAGNOSIS — R059 Cough, unspecified: Secondary | ICD-10-CM | POA: Diagnosis not present

## 2022-07-02 DIAGNOSIS — J441 Chronic obstructive pulmonary disease with (acute) exacerbation: Secondary | ICD-10-CM

## 2022-07-02 DIAGNOSIS — J471 Bronchiectasis with (acute) exacerbation: Secondary | ICD-10-CM | POA: Diagnosis not present

## 2022-07-02 DIAGNOSIS — L209 Atopic dermatitis, unspecified: Secondary | ICD-10-CM | POA: Diagnosis not present

## 2022-07-02 MED ORDER — PREDNISONE 20 MG PO TABS: 20.0000 mg | ORAL_TABLET | Freq: Every day | ORAL | 0 refills | Status: AC

## 2022-07-02 MED ORDER — AZITHROMYCIN 250 MG PO TABS: ORAL_TABLET | ORAL | 0 refills | Status: DC

## 2022-07-02 MED ORDER — FAMOTIDINE 20 MG PO TABS: 20.0000 mg | ORAL_TABLET | Freq: Every day | ORAL | 0 refills | Status: DC

## 2022-07-02 NOTE — Assessment & Plan Note (Signed)
Unclear etiology. Question relation to new soap? Advised he stop using this. He is being put on prednisone for his respiratory symptoms, which should help with his rash as well. Also advised him to start H2 therapy. Keep follow up with PCP as scheduled.

## 2022-07-02 NOTE — Assessment & Plan Note (Signed)
AECOPD/bronchiectatic flare with increased productive cough. Symptoms have improved with mucociliary clearance therapies but still not back to baseline. CXR today without superimposed infection. We will treat him with empiric azithromycin course and prednisone burst. Encouraged to continue bronchodilator and mucociliary clearance therapies. VS stable and non-toxic appearing. Action plan in place.  Patient Instructions  Continue Trelegy 1 puff daily. Brush tongue and rinse mouth afterwards Continue ventolin 2 puffs every 6 hours as needed for shortness of breath or wheezing Continue astelin nasal spray 1 spray each nostril Twice daily Continue flonase nasal spray 2 sprays each nostril daily Continue mucinex 1200 mg Twice daily for cough/congestion Continue pantoprazole 1 tab Twice daily as directed by your GI doctor  Use flutter valve 4-6 times a day, 10 times each until symptoms improve Prednisone 20 mg daily for 5 days. Take in AM with food Azithromycin 2 tabs on day one then 1 tab daily for four additional days. Take with food.  Famotidine 20 mg daily until rash resolves or directed by your primary care provider   Follow up in 3 months with Dr. Halford Chessman. If symptoms do not continue to improve or worsen, please contact office for sooner follow up or seek emergency care.

## 2022-07-02 NOTE — Patient Instructions (Addendum)
Continue Trelegy 1 puff daily. Brush tongue and rinse mouth afterwards Continue ventolin 2 puffs every 6 hours as needed for shortness of breath or wheezing Continue astelin nasal spray 1 spray each nostril Twice daily Continue flonase nasal spray 2 sprays each nostril daily Continue mucinex 1200 mg Twice daily for cough/congestion Continue pantoprazole 1 tab Twice daily as directed by your GI doctor  Use flutter valve 4-6 times a day, 10 times each until symptoms improve Prednisone 20 mg daily for 5 days. Take in AM with food Azithromycin 2 tabs on day one then 1 tab daily for four additional days. Take with food.  Famotidine 20 mg daily until rash resolves or directed by your primary care provider   Follow up in 3 months with Dr. Halford Chessman. If symptoms do not continue to improve or worsen, please contact office for sooner follow up or seek emergency care.

## 2022-07-02 NOTE — Progress Notes (Signed)
Reviewed and agree with assessment/plan.   [name], MD [facility] Pulmonary/Critical Care 07/02/2022, 5:15 PM Pager:  [phone]

## 2022-07-02 NOTE — Assessment & Plan Note (Signed)
See above plan. If no improvement, he will need repeat sputum culture and AFB. Previous cultures have been negative.

## 2022-07-02 NOTE — Progress Notes (Signed)
$'@Patient'v$  ID: Charles Golden, male    DOB: 1925/11/01, 89 y.o.   MRN: [id number]  Chief Complaint  Patient presents with   Follow-up    Pt f/u states he has been getting worse for a month, states he has chest congestion/coughing. Mucous he reports is clear/greyish and sometimes yellow. Daughter reports they increased mucinex to morning and night    Referring provider: [name], DO  HPI: 89 year old male, former smoker followed for COPD mixed type and bronchiectasis. He is a patient of Dr. Juanetta Gosling and last seen in office 02/06/2022. Past medical history significant for CVA, left carotid artery disease, a fib on Eliquis, GERD, BPH, RLS.  TEST/EVENTS:  03/2015 A1AT 153, MM 04/19/2015 CT chest: mild cylindrical BTX in bases, mild centrilobular emphysema, calcified mediastinal LN 07/13/2015 PFT: FVC 96, FEV1 92, ratio 68, TLC 112, DLCOcor 75. Mild obstruction with reversibility.  12/20/2015 PFT: FVC 91, FEV1 85, ratio 69 11/15/2016 PSG: AHI 3.6 03/14/2021 HRCT chest: diffuse bronchial thickening, scarring at bases 04/28/2021 echo: EF 60-65%, G2DD, mild/mod RV dilation  02/06/2022: OV with Dr. Halford Chessman. Cough with yellow sputum. Comes up easily. Uses mucinex daily. Not sure where his flutter valve went. Sleeping okay. Gets tired easily with activity. Daughter states he doesn't do much. Recently changed to trelegy and feels this works better. Maintains SpO2 >92% on room air. Continued on Trelegy.   07/02/2022: Today - acute Patient presents today for acute visit with his daughter. Around a month ago, he noticed that he was coughing a lot more, producing a significant amount of greyish phlegm, and having more chest congestion. He increased his mucinex to twice daily and feels like this has helped. His cough seems to be less often and he's not producing nearly as much phlegm. He does still feel like it's slightly increased from his baseline. He also feels like he is a little more winded than he  normally is. He denies any fevers, chills, hemoptysis, night sweats, wheezing, orthopnea. He does not have any upper respiratory symptoms. He also notes that he has developed a rash over the past week. Started on his legs and now it's traveled to his other leg and upper bag. He has plans to see his PCP tomorrow. The rash does itch and is red. No drainage. No difficulties swallowing, tongue/facial swelling. He has been using hydrocortisone cream, which helps some. He was wondering if it was the soap he has been using. Denies new lotions or laundry detergent. No new medications or known topical exposures.   Allergies  Allergen Reactions   Demerol [Meperidine] Other (See Comments)    Pt. States "he woke up during a colonoscopy"   Spiriva Handihaler [Tiotropium Bromide Monohydrate] Other (See Comments)    Mild Urinary Retention    Immunization History  Administered Date(s) Administered   Fluad Quad(high Dose 65+) 03/04/2019, 03/15/2020, 03/07/2021, 04/04/2022   Influenza, High Dose Seasonal PF 03/13/2016, 03/05/2017, 03/07/2018   Influenza,inj,Quad PF,6+ Mos 03/20/2013, 03/08/2014, 03/15/2015   Moderna Sars-Covid-2 Vaccination 06/19/2019, 07/17/2019, 04/12/2020, 12/27/2020   Pneumococcal Conjugate-13 12/08/2013   Pneumococcal Polysaccharide-23 05/28/1997   Td 05/29/2007   Tdap 11/05/2016   Zoster Recombinat (Shingrix) 05/05/2018, 07/17/2018   Zoster, Live 05/07/2006    Past Medical History:  Diagnosis Date   Acute medial meniscal tear 07/20/2014   Atrial fibrillation with RVR ([place]) 05/10/2021   Chronic headache 08/05/2013   COPD (chronic obstructive pulmonary disease) ([place])    CVA (cerebral vascular accident) ([place]) 05/01/2021   Enlarged  prostate    GERD (gastroesophageal reflux disease)    Glaucoma    Glucagonoma    Hernia, incisional    at present   [facility] (hard of hearing)    IBS (irritable bowel syndrome)    Incisional hernia, without obstruction or gangrene 08/05/2013   Sciatic  pain    Stage 3b chronic kidney disease (CKD) ([place])     Tobacco History: Social History   Tobacco Use  Smoking Status Former   Packs/day: 1.00   Years: 30.00   Total pack years: 30.00   Types: Cigarettes   Quit date: 05/28/2006   Years since quitting: 16.1  Smokeless Tobacco Never   Counseling given: Not Answered   Outpatient Medications Prior to Visit  Medication Sig Dispense Refill   apixaban (ELIQUIS) 2.5 MG TABS tablet Take 1 tablet (2.5 mg total) by mouth 2 (two) times daily. 180 tablet 3   aspirin EC 81 MG tablet Take 1 tablet (81 mg total) by mouth daily. Swallow whole. 30 tablet 11   atorvastatin (LIPITOR) 40 MG tablet Take 1 tablet (40 mg total) by mouth daily. 90 tablet 3   Azelastine HCl 137 MCG/SPRAY SOLN PLACE 1 SPRAY IN EACH NOSTRIL ONCE A DAY AS DIRECTED 30 mL 5   Cholecalciferol (VITAMIN D3) 2000 UNITS TABS Take 1 tablet by mouth daily.     CRANBERRY PO Take by mouth.     Cyanocobalamin (VITAMIN B-12) 5000 MCG SUBL Place 1 tablet under the tongue daily.     finasteride (PROSCAR) 5 MG tablet Take 5 mg by mouth daily.     fluticasone (FLONASE) 50 MCG/ACT nasal spray Place 2 sprays into both nostrils daily. 16 g 11   Fluticasone-Umeclidin-Vilant (TRELEGY ELLIPTA) 100-62.5-25 MCG/ACT AEPB Inhale 1 puff into the lungs daily in the afternoon. 60 each 5   guaiFENesin (MUCINEX) 600 MG 12 hr tablet Take 2 tablets (1,200 mg total) by mouth 2 (two) times daily as needed for to loosen phlegm or cough.     latanoprost (XALATAN) 0.005 % ophthalmic solution Place 1 drop into the left eye at bedtime. (Patient taking differently: Place 1 drop into both eyes at bedtime.) 2.5 mL 1   pantoprazole (PROTONIX) 40 MG tablet TAKE 1 TABLET 2 TIMES A DAY BEFORE A MEAL 180 tablet 1   silodosin (RAPAFLO) 8 MG CAPS capsule Take 8 mg by mouth daily.     timolol (TIMOPTIC) 0.5 % ophthalmic solution Place 1 drop into the left eye every morning.     VENTOLIN HFA 108 (90 Base) MCG/ACT inhaler Inhale  2 puffs into the lungs every 6 (six) hours as needed for wheezing or shortness of breath. 6.7 g 5   No facility-administered medications prior to visit.     Review of Systems:   Constitutional: No weight loss or gain, night sweats, fevers, chills, or lassitude. +fatigue (baseline) HEENT: No headaches, difficulty swallowing, tooth/dental problems, or sore throat. No sneezing, itching, ear ache, nasal congestion, or post nasal drip CV:  No chest pain, orthopnea, PND, swelling in lower extremities, anasarca, dizziness, palpitations, syncope Resp: +shortness of breath with exertion; productive cough. No hemoptysis. No wheezing.  No chest wall deformity GI:  No heartburn, indigestion, abdominal pain, nausea, vomiting, diarrhea, change in bowel habits, loss of appetite, bloody stools.  GU: No dysuria, change in color of urine, urgency or frequency.   Skin: +rash RLL, LLE, torso and back MSK:  No joint pain or swelling.  No decreased range of motion.  No back pain.  Neuro: No dizziness or lightheadedness.  Psych: No depression or anxiety. Mood stable.     Physical Exam:  BP 110/74   Pulse 66   Ht '5\' 10"'$  (1.778 m)   Wt 158 lb 9.6 oz (71.9 kg)   SpO2 97%   BMI 22.76 kg/m   GEN: Pleasant, interactive, well-kempt; elderly; in no acute distress. HEENT:  Normocephalic and atraumatic. PERRLA. Sclera white. Nasal turbinates pink, moist and patent bilaterally. No rhinorrhea present. Oropharynx pink and moist, without exudate or edema. No lesions, ulcerations, or postnasal drip.  NECK:  Supple w/ fair ROM. No JVD present. Normal carotid impulses w/o bruits. Thyroid symmetrical with no goiter or nodules palpated. No lymphadenopathy.   CV: RRR, no m/r/g, no peripheral edema. Pulses intact, +2 bilaterally. No cyanosis, pallor or clubbing. PULMONARY:  Unlabored, regular breathing. End expiratory wheeze bilaterally A&P. Congested cough. No accessory muscle use.  GI: BS present and normoactive. Soft,  non-tender to palpation. No organomegaly or masses detected.  MSK: No erythema, warmth or tenderness. Cap refil <2 sec all extrem. No deformities or joint swelling noted.  Neuro: A/Ox3. No focal deficits noted.   Skin: Warm. Urticaria left lower leg, right lower leg, and upper thoracic region; no exudate or warmth Psych: Normal affect and behavior. Judgement and thought content appropriate.     Lab Results:  CBC    Component Value Date/Time   WBC 6.6 10/25/2021 0959   WBC 8.9 05/13/2021 0131   RBC 4.23 10/25/2021 0959   RBC 3.36 (L) 05/13/2021 0131   HGB 13.3 10/25/2021 0959   HCT 38.9 10/25/2021 0959   PLT 263 10/25/2021 0959   MCV 92 10/25/2021 0959   MCH 31.4 10/25/2021 0959   MCH 30.4 05/13/2021 0131   MCHC 34.2 10/25/2021 0959   MCHC 32.1 05/13/2021 0131   RDW 12.6 10/25/2021 0959   LYMPHSABS 1.6 10/25/2021 0959   MONOABS 1.0 05/10/2021 1702   EOSABS 0.2 10/25/2021 0959   BASOSABS 0.0 10/25/2021 0959    BMET    Component Value Date/Time   NA 139 10/25/2021 0959   K 4.8 10/25/2021 0959   CL 102 10/25/2021 0959   CO2 24 10/25/2021 0959   GLUCOSE 89 10/25/2021 0959   GLUCOSE 98 05/13/2021 0131   BUN 17 10/25/2021 0959   CREATININE 1.50 (H) 10/25/2021 0959   CREATININE 1.36 (H) 09/25/2012 1108   CALCIUM 9.2 10/25/2021 0959   GFRNONAA 48 (L) 05/13/2021 0131   GFRNONAA 47 (L) 09/25/2012 1108   GFRAA 44 (L) 06/10/2020 1032   GFRAA 54 (L) 09/25/2012 1108    BNP    Component Value Date/Time   BNP 105.7 (H) 05/10/2021 1702     Imaging:  DG Chest 2 View  Result Date: 07/02/2022 CLINICAL DATA:  Increased productive cough for greater than 1 month. COPD. EXAM: CHEST - 2 VIEW COMPARISON:  Chest radiographs 05/10/2021, 02/01/2021 FINDINGS: Cardiac silhouette and mediastinal contours are unchanged with mildly tortuous descending thoracic aorta normal heart size. Mild calcification within the aortic arch. The lungs are clear. Resolution of the prior right lower lung  airspace opacity on 05/10/2021 most recent radiograph. No pleural effusion or pneumothorax. Mild-to-moderate multilevel degenerative disc changes of the thoracic spine. IMPRESSION: No active cardiopulmonary disease. Electronically Signed   By: Yvonne Kendall M.D.   On: 07/02/2022 14:03         Latest Ref Rng & Units 12/20/2015    3:43 PM 07/13/2015   11:30 AM  PFT Results  FVC-Pre L  3.41  3.63   FVC-Predicted Pre % 91  96   FVC-Post L 3.50  3.97   FVC-Predicted Post % 93  105   Pre FEV1/FVC % % 65  66   Post FEV1/FCV % % 69  68   FEV1-Pre L 2.21  2.40   FEV1-Predicted Pre % 85  92   FEV1-Post L 2.43  2.71   DLCO uncorrected ml/min/mmHg  23.37   DLCO UNC% %  70   DLCO corrected ml/min/mmHg  24.80   DLCO COR %Predicted %  75   DLVA Predicted %  74   TLC L  8.07   TLC % Predicted %  112   RV % Predicted %  143     No results found for: "NITRICOXIDE"      Assessment & Plan:   COPD with acute exacerbation (HCC) AECOPD/bronchiectatic flare with increased productive cough. Symptoms have improved with mucociliary clearance therapies but still not back to baseline. CXR today without superimposed infection. We will treat him with empiric azithromycin course and prednisone burst. Encouraged to continue bronchodilator and mucociliary clearance therapies. VS stable and non-toxic appearing. Action plan in place.  Patient Instructions  Continue Trelegy 1 puff daily. Brush tongue and rinse mouth afterwards Continue ventolin 2 puffs every 6 hours as needed for shortness of breath or wheezing Continue astelin nasal spray 1 spray each nostril Twice daily Continue flonase nasal spray 2 sprays each nostril daily Continue mucinex 1200 mg Twice daily for cough/congestion Continue pantoprazole 1 tab Twice daily as directed by your GI doctor  Use flutter valve 4-6 times a day, 10 times each until symptoms improve Prednisone 20 mg daily for 5 days. Take in AM with food Azithromycin 2 tabs on day  one then 1 tab daily for four additional days. Take with food.  Famotidine 20 mg daily until rash resolves or directed by your primary care provider   Follow up in 3 months with Dr. Halford Chessman. If symptoms do not continue to improve or worsen, please contact office for sooner follow up or seek emergency care.    Bronchiectasis with acute exacerbation ([place]) See above plan. If no improvement, he will need repeat sputum culture and AFB. Previous cultures have been negative.   Atopic dermatitis Unclear etiology. Question relation to new soap? Advised he stop using this. He is being put on prednisone for his respiratory symptoms, which should help with his rash as well. Also advised him to start H2 therapy. Keep follow up with PCP as scheduled.    I spent 35 minutes of dedicated to the care of this patient on the date of this encounter to include pre-visit review of records, face-to-face time with the patient discussing conditions above, post visit ordering of testing, clinical documentation with the electronic health record, making appropriate referrals as documented, and communicating necessary findings to members of the patients care team.  Clayton Bibles, NP 07/02/2022  Pt aware and understands NP's role.

## 2022-07-03 ENCOUNTER — Ambulatory Visit (INDEPENDENT_AMBULATORY_CARE_PROVIDER_SITE_OTHER): Payer: Medicare Other | Admitting: Family Medicine

## 2022-07-03 ENCOUNTER — Encounter: Payer: Self-pay | Admitting: Family Medicine

## 2022-07-03 VITALS — BP 144/87 | HR 75 | Temp 97.8°F | Ht 70.0 in | Wt 158.0 lb

## 2022-07-03 DIAGNOSIS — L209 Atopic dermatitis, unspecified: Secondary | ICD-10-CM

## 2022-07-03 NOTE — Progress Notes (Signed)
Subjective: CC: Pruritic Rash PCP: [name], DO  EVO:[id number] R Faucett is a 89 y.o. male presenting to clinic today for: Rash   Patient reports three weeks of pruritic rash that he notices throughout the day and worsens at night. The rash is located on bilateral lower extremities and axilla area. He characterizes the rash as itchy and burning. Was prescribed a steroid by pulmonology yesterday. Does not use lotions. No rash on his face. Feels that the rash is worse compared to three weeks ago. Felt that he slept better yesterday compared to previous. Last night his daughter applied lotion to the area. Patient and his family have been using corticosteroid cream regularly over the affected area.  Relevant past medical, surgical, family, and social history reviewed and updated as indicated.  Allergies and medications reviewed and updated.  Allergies  Allergen Reactions   Demerol [Meperidine] Other (See Comments)    Pt. States "he woke up during a colonoscopy"   Spiriva Handihaler [Tiotropium Bromide Monohydrate] Other (See Comments)    Mild Urinary Retention   Past Medical History:  Diagnosis Date   Acute medial meniscal tear 07/20/2014   Atrial fibrillation with RVR ([place]) 05/10/2021   Chronic headache 08/05/2013   COPD (chronic obstructive pulmonary disease) (HCC)    CVA (cerebral vascular accident) ([place]) 05/01/2021   Enlarged prostate    GERD (gastroesophageal reflux disease)    Glaucoma    Glucagonoma    Hernia, incisional    at present   [facility] (hard of hearing)    IBS (irritable bowel syndrome)    Incisional hernia, without obstruction or gangrene 08/05/2013   Sciatic pain    Stage 3b chronic kidney disease (CKD) (HCC)     Current Outpatient Medications:    apixaban (ELIQUIS) 2.5 MG TABS tablet, Take 1 tablet (2.5 mg total) by mouth 2 (two) times daily., Disp: 180 tablet, Rfl: 3   aspirin EC 81 MG tablet, Take 1 tablet (81 mg total) by mouth daily. Swallow whole.,  Disp: 30 tablet, Rfl: 11   atorvastatin (LIPITOR) 40 MG tablet, Take 1 tablet (40 mg total) by mouth daily., Disp: 90 tablet, Rfl: 3   Azelastine HCl 137 MCG/SPRAY SOLN, PLACE 1 SPRAY IN EACH NOSTRIL ONCE A DAY AS DIRECTED, Disp: 30 mL, Rfl: 5   azithromycin (ZITHROMAX) 250 MG tablet, Take 2 tabs on day one then 1 tab daily for four additional days, Disp: 6 tablet, Rfl: 0   Cholecalciferol (VITAMIN D3) 2000 UNITS TABS, Take 1 tablet by mouth daily., Disp: , Rfl:    CRANBERRY PO, Take by mouth., Disp: , Rfl:    Cyanocobalamin (VITAMIN B-12) 5000 MCG SUBL, Place 1 tablet under the tongue daily., Disp: , Rfl:    famotidine (PEPCID) 20 MG tablet, Take 1 tablet (20 mg total) by mouth daily., Disp: 30 tablet, Rfl: 0   finasteride (PROSCAR) 5 MG tablet, Take 5 mg by mouth daily., Disp: , Rfl:    fluticasone (FLONASE) 50 MCG/ACT nasal spray, Place 2 sprays into both nostrils daily., Disp: 16 g, Rfl: 11   Fluticasone-Umeclidin-Vilant (TRELEGY ELLIPTA) 100-62.5-25 MCG/ACT AEPB, Inhale 1 puff into the lungs daily in the afternoon., Disp: 60 each, Rfl: 5   guaiFENesin (MUCINEX) 600 MG 12 hr tablet, Take 2 tablets (1,200 mg total) by mouth 2 (two) times daily as needed for to loosen phlegm or cough., Disp: , Rfl:    latanoprost (XALATAN) 0.005 % ophthalmic solution, Place 1 drop into the left eye at bedtime. (Patient  taking differently: Place 1 drop into both eyes at bedtime.), Disp: 2.5 mL, Rfl: 1   pantoprazole (PROTONIX) 40 MG tablet, TAKE 1 TABLET 2 TIMES A DAY BEFORE A MEAL, Disp: 180 tablet, Rfl: 1   predniSONE (DELTASONE) 20 MG tablet, Take 1 tablet (20 mg total) by mouth daily with breakfast for 5 days., Disp: 5 tablet, Rfl: 0   silodosin (RAPAFLO) 8 MG CAPS capsule, Take 8 mg by mouth daily., Disp: , Rfl:    timolol (TIMOPTIC) 0.5 % ophthalmic solution, Place 1 drop into the left eye every morning., Disp: , Rfl:    VENTOLIN HFA 108 (90 Base) MCG/ACT inhaler, Inhale 2 puffs into the lungs every 6 (six)  hours as needed for wheezing or shortness of breath., Disp: 6.7 g, Rfl: 5 Social History   Socioeconomic History   Marital status: Married    Spouse name: [name]   Number of children: 3   Years of education: 12+   Highest education level: Bachelor's degree (e.g., BA, AB, BS)  Occupational History   Occupation: Retired    Comment: Immunologist  Tobacco Use   Smoking status: Former    Packs/day: 1.00    Years: 30.00    Total pack years: 30.00    Types: Cigarettes    Quit date: 05/28/2006    Years since quitting: 16.1   Smokeless tobacco: Never  Vaping Use   Vaping Use: Never used  Substance and Sexual Activity   Alcohol use: Not Currently    Alcohol/week: 0.0 standard drinks of alcohol   Drug use: No   Sexual activity: Yes    Birth control/protection: None  Other Topics Concern   Not on file  Social History Narrative   Patient lives at home with [name].    Patient has 3 children.    Patient has his BS   Patient is retired.    Drinks about 2 cups of coffee per day.      [facility]:   He is still married. 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 He is from [place].       Social Determinants of Health   Financial Resource Strain: Low Risk  (02/09/2020)   Overall Financial Resource Strain (CARDIA)    Difficulty of Paying Living Expenses: Not hard at all  Food Insecurity: No Food Insecurity (01/01/2022)   Hunger Vital Sign    Worried About Running Out of Food in the Last Year: Never true    Ran Out of Food in the Last Year: Never true  Transportation Needs: No Transportation Needs (01/01/2022)   PRAPARE - Hydrologist (Medical): No    Lack of Transportation (Non-Medical): No  Physical Activity: Insufficiently Active (05/19/2021)   Exercise Vital Sign    Days of Exercise per Week: 1 day    Minutes of Exercise per Session: 10 min  Stress: Stress Concern  Present (07/17/2021)   [place]    Feeling of Stress : To some extent  Social Connections: Socially Integrated (06/09/2019)   Social Connection and Isolation Panel [NHANES]    Frequency of Communication with Friends and Family: More than three times a week    Frequency of Social Gatherings with Friends and Family: More than three times a week    Attends Religious Services: More than 4 times per year    Active Member of [employer]  or Organizations: Yes    Attends Archivist Meetings: More than 4 times per year    Marital Status: Married  Human resources officer Violence: Not At Risk (01/01/2022)   Humiliation, Afraid, Rape, and Kick questionnaire    Fear of Current or Ex-Partner: No    Emotionally Abused: No    Physically Abused: No    Sexually Abused: No   Family History  Problem Relation Age of Onset   Heart disease Mother        Valve replacement and pacemaker   Colon cancer Father    Asthma Other        cousin   Healthy Daughter    Healthy Daughter    Healthy Daughter     Review of Systems  All other systems reviewed and are negative.   Objective: Office vital signs reviewed. BP (!) 144/87   Pulse 75   Temp 97.8 F (36.6 C) (Temporal)   Ht '5\' 10"'$  (1.778 m)   Wt 158 lb (71.7 kg)   SpO2 98%   BMI 22.67 kg/m   Physical Examination:  Physical Exam Vitals reviewed.  Constitutional:      General: He is not in acute distress.    Appearance: Normal appearance. He is not ill-appearing.  Skin:    General: Skin is warm and dry.     Coloration: Skin is not ashen.     Findings: Erythema and rash present. No abrasion. Rash is papular and scaling.       Neurological:     General: No focal deficit present.     Mental Status: He is alert and oriented to person, place, and time.  Psychiatric:        Mood and Affect: Mood normal.        Behavior: Behavior normal.        Thought Content: Thought content  normal.      Results for orders placed or performed in visit on 10/25/21  Urinalysis, Routine w reflex microscopic  Result Value Ref Range   Specific Gravity, UA 1.010 1.005 - 1.030   pH, UA 7.0 5.0 - 7.5   Color, UA Yellow Yellow   Appearance Ur Clear Clear   Leukocytes,UA Negative Negative   Protein,UA Negative Negative/Trace   Glucose, UA Negative Negative   Ketones, UA Negative Negative   RBC, UA Negative Negative   Bilirubin, UA Negative Negative   Urobilinogen, Ur 0.2 0.2 - 1.0 mg/dL   Nitrite, UA Negative Negative  CMP14+EGFR  Result Value Ref Range   Glucose 89 70 - 99 mg/dL   BUN 17 10 - 36 mg/dL   Creatinine, Ser 1.50 (H) 0.76 - 1.27 mg/dL   eGFR 43 (L) >59 mL/min/1.73   BUN/Creatinine Ratio 11 10 - 24   Sodium 139 134 - 144 mmol/L   Potassium 4.8 3.5 - 5.2 mmol/L   Chloride 102 96 - 106 mmol/L   CO2 24 20 - 29 mmol/L   Calcium 9.2 8.6 - 10.2 mg/dL   Total Protein 6.0 6.0 - 8.5 g/dL   Albumin 3.8 3.5 - 4.6 g/dL   Globulin, Total 2.2 1.5 - 4.5 g/dL   Albumin/Globulin Ratio 1.7 1.2 - 2.2   Bilirubin Total 0.3 0.0 - 1.2 mg/dL   Alkaline Phosphatase 121 44 - 121 IU/L   AST 19 0 - 40 IU/L   ALT 15 0 - 44 IU/L  CBC with Differential  Result Value Ref Range   WBC 6.6 3.4 - 10.8 x10E3/uL  RBC 4.23 4.14 - 5.80 x10E6/uL   Hemoglobin 13.3 13.0 - 17.7 g/dL   Hematocrit 38.9 37.5 - 51.0 %   MCV 92 79 - 97 fL   MCH 31.4 26.6 - 33.0 pg   MCHC 34.2 31.5 - 35.7 g/dL   RDW 12.6 11.6 - 15.4 %   Platelets 263 150 - 450 x10E3/uL   Neutrophils 65 Not Estab. %   Lymphs 24 Not Estab. %   Monocytes 9 Not Estab. %   Eos 2 Not Estab. %   Basos 0 Not Estab. %   Neutrophils Absolute 4.3 1.4 - 7.0 x10E3/uL   Lymphocytes Absolute 1.6 0.7 - 3.1 x10E3/uL   Monocytes Absolute 0.6 0.1 - 0.9 x10E3/uL   EOS (ABSOLUTE) 0.2 0.0 - 0.4 x10E3/uL   Basophils Absolute 0.0 0.0 - 0.2 x10E3/uL   Immature Granulocytes 0 Not Estab. %   Immature Grans (Abs) 0.0 0.0 - 0.1 x10E3/uL      Assessment/ Plan: Patient has a mildly erythematous and scaling rash in patches in anterior proximal lower leg as well as in axillary regions. Most likely atopic dermatitis secondary to dry skin. Rash is not consistent with shingles and patient is full vaccinated. Patient's daughter reports that patient has chronic dry skin and does not use lotion regularly. Patient is amenable to using barrier ointment. Suggested [employer] brand eczema emollient.  Patient should continue steroid started with pulmonology and follow-up if no improvement.   Continue all other maintenance medications.  Follow up plan: Return if symptoms worsen or fail to improve.  The above assessment and management plan was discussed with the patient. The patient verbalized understanding of and has agreed to the management plan. Patient is aware to call the clinic if symptoms persist or worsen. Patient is aware when to return to the clinic for a follow-up visit. Patient educated on when it is appropriate to go to the emergency department.   Stephani Police, MS3

## 2022-07-17 DIAGNOSIS — H401122 Primary open-angle glaucoma, left eye, moderate stage: Secondary | ICD-10-CM | POA: Diagnosis not present

## 2022-07-17 DIAGNOSIS — H353131 Nonexudative age-related macular degeneration, bilateral, early dry stage: Secondary | ICD-10-CM | POA: Diagnosis not present

## 2022-07-17 DIAGNOSIS — H40051 Ocular hypertension, right eye: Secondary | ICD-10-CM | POA: Diagnosis not present

## 2022-07-17 DIAGNOSIS — H26493 Other secondary cataract, bilateral: Secondary | ICD-10-CM | POA: Diagnosis not present

## 2022-07-17 DIAGNOSIS — Z961 Presence of intraocular lens: Secondary | ICD-10-CM | POA: Diagnosis not present

## 2022-07-23 NOTE — Telephone Encounter (Signed)
Rescheduled 08/06/22  [place]  Direct Dial: [phone]

## 2022-07-28 ENCOUNTER — Encounter: Payer: Self-pay | Admitting: Family Medicine

## 2022-08-01 DIAGNOSIS — R3912 Poor urinary stream: Secondary | ICD-10-CM | POA: Diagnosis not present

## 2022-08-01 DIAGNOSIS — N401 Enlarged prostate with lower urinary tract symptoms: Secondary | ICD-10-CM | POA: Diagnosis not present

## 2022-08-01 DIAGNOSIS — N200 Calculus of kidney: Secondary | ICD-10-CM | POA: Diagnosis not present

## 2022-08-01 DIAGNOSIS — R350 Frequency of micturition: Secondary | ICD-10-CM | POA: Diagnosis not present

## 2022-08-06 ENCOUNTER — Ambulatory Visit: Payer: Self-pay | Admitting: *Deleted

## 2022-08-06 DIAGNOSIS — H9113 Presbycusis, bilateral: Secondary | ICD-10-CM

## 2022-08-06 DIAGNOSIS — I480 Paroxysmal atrial fibrillation: Secondary | ICD-10-CM

## 2022-08-06 DIAGNOSIS — J449 Chronic obstructive pulmonary disease, unspecified: Secondary | ICD-10-CM

## 2022-08-06 DIAGNOSIS — Z8673 Personal history of transient ischemic attack (TIA), and cerebral infarction without residual deficits: Secondary | ICD-10-CM

## 2022-08-06 NOTE — Patient Outreach (Signed)
Care Coordination   Follow Up Visit Note   01/17/2023 update entry for 08/06/22 Name: Charles Golden MRN: [id number] DOB: 1925-08-23  Charles Golden is a 89 y.o. year old male who sees Charles Golden, Charles Golden for primary care. I spoke with  Charles Golden by phone today.  What matters to the patients health and wellness today?  New hearing aid 2-4 days    [facility] MDs follow him  Arthritis pain ongoing   Goals Addressed               This Visit's Progress     Patient Stated     Manage pain of knee/hip, hearing aid resource [facility]) (pt-stated)   On track     Patient goals for Care Coordination: Reviewed provider established plan for pain management Discussed importance of adherence to all scheduled medical appointments Counseled on the importance of reporting any/all new or changed pain symptoms or management strategies to pain management provider Advised patient to report to care team affect of pain on daily activities Discussed use of relaxation techniques and/or diversional activities to assist with pain reduction (distraction, imagery, relaxation, massage, acupressure, TENS, heat, and cold application Reviewed with patient prescribed pharmacological and nonpharmacological pain relief strategies Screening for signs and symptoms of depression related to chronic disease state  Assessed social determinant of health barriers Sent education information on arthritis, knee/hip pain via my chart   Interventions Today    Flowsheet Row Most Recent Value  Chronic Disease   Chronic disease during today's visit Other  [hearing, arthritis]  General Interventions   General Interventions Discussed/Reviewed General Interventions Discussed, Durable Medical Equipment (DME), Communication with, [facility], Doctor Visits  Doctor Visits Discussed/Reviewed Doctor Visits Discussed, PCP  Horticulturist, commercial (DME) Other  Charles Golden aid]  PCP/Specialist Visits Compliance with  follow-up visit  Communication with RN  [thn care guide assist with hearing aid resource]  Exercise Interventions   Exercise Discussed/Reviewed Exercise Discussed, Assistive device use and maintanence  Education Interventions   Education Provided Provided Education  Provided Verbal Education On Community Resources  Mental Health Interventions   Mental Health Discussed/Reviewed Mental Health Discussed, Coping Strategies  Nutrition Interventions   Nutrition Discussed/Reviewed Nutrition Discussed, Decreasing salt  Pharmacy Interventions   Pharmacy Dicussed/Reviewed Pharmacy Topics Discussed, Affording Medications  Safety Interventions   Safety Discussed/Reviewed Safety Discussed            Other     AWV   On track     06/09/2020 AWV Goal: Fall Prevention  Over the next year, patient will decrease their risk for falls by: Using assistive devices, such as a cane or walker, as needed Identifying fall risks within their home and correcting them by: Removing throw rugs Adding handrails to stairs or ramps Removing clutter and keeping a clear pathway throughout the home Increasing light, especially at night Adding shower handles/bars Raising toilet seat Identifying potential personal risk factors for falls: Medication side effects Incontinence/urgency Vestibular dysfunction Hearing loss Musculoskeletal disorders Neurological disorders Orthostatic hypotension  06/09/2020 AWV Goal: Exercise for General Health  Patient will verbalize understanding of the benefits of increased physical activity: Exercising regularly is important. It will improve your overall fitness, flexibility, and endurance. Regular exercise also will improve your overall health. It can help you control your weight, reduce stress, and improve your bone density. Over the next year, patient will increase physical activity as tolerated with a goal of at least 150 minutes of moderate physical activity per week.  You  can tell that you are exercising at a moderate intensity if your heart starts beating faster and you start breathing faster but can still hold a conversation. Moderate-intensity exercise ideas include: Walking 1 mile (1.6 km) in about 15 minutes Biking Hiking Golfing Dancing Water aerobics Patient will verbalize understanding of everyday activities that increase physical activity by providing examples like the following: Yard work, such as: Insurance underwriter Gardening Washing windows or floors Patient will be able to explain general safety guidelines for exercising:  Before you start a new exercise program, talk with your health care provider. Charles Golden not exercise so much that you hurt yourself, feel dizzy, or get very short of breath. Wear comfortable clothes and wear shoes with good support. Drink plenty of water while you exercise to prevent dehydration or heat stroke. Work out until your breathing and your heartbeat get faster.         SDOH assessments and interventions completed:  No     Care Coordination Interventions:  Yes, provided   Follow up plan: Follow up call scheduled for 01/18/23    Encounter Outcome:  Pt. Visit Completed   Charles Golden L. Noelle Penner, RN, BSN, CCM [facility] Care Management Community Coordinator Office number [phone]

## 2022-08-06 NOTE — Patient Instructions (Addendum)
Visit Information  Thank you for taking time to visit with me today. Please don't hesitate to contact me if I can be of assistance to you.   Following are the goals we discussed today:   Goals Addressed               This Visit's Progress     Patient Stated     Manage pain of knee/hip, hearing aid resource [facility]) (pt-stated)   On track     Patient goals for Care Coordination: Reviewed provider established plan for pain management Discussed importance of adherence to all scheduled medical appointments Counseled on the importance of reporting any/all new or changed pain symptoms or management strategies to pain management provider Advised patient to report to care team affect of pain on daily activities Discussed use of relaxation techniques and/or diversional activities to assist with pain reduction (distraction, imagery, relaxation, massage, acupressure, TENS, heat, and cold application Reviewed with patient prescribed pharmacological and nonpharmacological pain relief strategies Screening for signs and symptoms of depression related to chronic disease state  Assessed social determinant of health barriers Sent education information on arthritis, knee/hip pain via my chart   Interventions Today    Flowsheet Row Most Recent Value  Chronic Disease   Chronic disease during today's visit Other  [hearing, arthritis]  General Interventions   General Interventions Discussed/Reviewed General Interventions Discussed, Durable Medical Equipment (DME), Communication with, [facility], Doctor Visits  Doctor Visits Discussed/Reviewed Doctor Visits Discussed, PCP  Horticulturist, commercial (DME) Other  Orvis Brill aid]  PCP/Specialist Visits Compliance with follow-up visit  Communication with RN  [thn care guide assist with hearing aid resource]  Exercise Interventions   Exercise Discussed/Reviewed Exercise Discussed, Assistive device use and maintanence  Education Interventions    Education Provided Provided Education  Provided Verbal Education On Community Resources  Mental Health Interventions   Mental Health Discussed/Reviewed Mental Health Discussed, Coping Strategies  Nutrition Interventions   Nutrition Discussed/Reviewed Nutrition Discussed, Decreasing salt  Pharmacy Interventions   Pharmacy Dicussed/Reviewed Pharmacy Topics Discussed, Affording Medications  Safety Interventions   Safety Discussed/Reviewed Safety Discussed            Other     AWV   On track     06/09/2020 AWV Goal: Fall Prevention  Over the next year, patient will decrease their risk for falls by: Using assistive devices, such as a cane or walker, as needed Identifying fall risks within their home and correcting them by: Removing throw rugs Adding handrails to stairs or ramps Removing clutter and keeping a clear pathway throughout the home Increasing light, especially at night Adding shower handles/bars Raising toilet seat Identifying potential personal risk factors for falls: Medication side effects Incontinence/urgency Vestibular dysfunction Hearing loss Musculoskeletal disorders Neurological disorders Orthostatic hypotension  06/09/2020 AWV Goal: Exercise for General Health  Patient will verbalize understanding of the benefits of increased physical activity: Exercising regularly is important. It will improve your overall fitness, flexibility, and endurance. Regular exercise also will improve your overall health. It can help you control your weight, reduce stress, and improve your bone density. Over the next year, patient will increase physical activity as tolerated with a goal of at least 150 minutes of moderate physical activity per week.  You can tell that you are exercising at a moderate intensity if your heart starts beating faster and you start breathing faster but can still hold a conversation. Moderate-intensity exercise ideas include: Walking 1 mile (1.6 km) in  about 15 minutes Biking Hiking  Golfing Dancing Water aerobics Patient will verbalize understanding of everyday activities that increase physical activity by providing examples like the following: Yard work, such as: Insurance underwriter Gardening Washing windows or floors Patient will be able to explain general safety guidelines for exercising:  Before you start a new exercise program, talk with your health care provider. Do not exercise so much that you hurt yourself, feel dizzy, or get very short of breath. Wear comfortable clothes and wear shoes with good support. Drink plenty of water while you exercise to prevent dehydration or heat stroke. Work out until your breathing and your heartbeat get faster.         Our next appointment is by telephone on 01/18/23 at 1100  Please call the care guide team at [phone] if you need to cancel or reschedule your appointment.   If you are experiencing a Mental Health or Behavioral Health Crisis or need someone to talk to, please call the Suicide and Crisis Lifeline: 988 call the [place] National Suicide Prevention Lifeline: 929-087-1551 or TTY: 201-550-3187 TTY 617-876-2270) to talk to a trained counselor call 1-800-273-TALK (toll free, 24 hour hotline) call the [facility]: 919-751-0746 call 911   Patient verbalizes understanding of instructions and care plan provided today and agrees to view in MyChart. Active MyChart status and patient understanding of how to access instructions and care plan via MyChart confirmed with patient.     The patient has been provided with contact information for the care management team and has been advised to call with any health related questions or concerns.   Derek Huneycutt L. Noelle Penner, RN, BSN, CCM [facility] Care Management Community Coordinator Office number [phone]

## 2022-08-07 DIAGNOSIS — Z08 Encounter for follow-up examination after completed treatment for malignant neoplasm: Secondary | ICD-10-CM | POA: Diagnosis not present

## 2022-08-07 DIAGNOSIS — L821 Other seborrheic keratosis: Secondary | ICD-10-CM | POA: Diagnosis not present

## 2022-08-07 DIAGNOSIS — Z85828 Personal history of other malignant neoplasm of skin: Secondary | ICD-10-CM | POA: Diagnosis not present

## 2022-08-07 DIAGNOSIS — L728 Other follicular cysts of the skin and subcutaneous tissue: Secondary | ICD-10-CM | POA: Diagnosis not present

## 2022-08-07 DIAGNOSIS — D225 Melanocytic nevi of trunk: Secondary | ICD-10-CM | POA: Diagnosis not present

## 2022-08-07 DIAGNOSIS — L814 Other melanin hyperpigmentation: Secondary | ICD-10-CM | POA: Diagnosis not present

## 2022-08-08 ENCOUNTER — Other Ambulatory Visit: Payer: Self-pay | Admitting: Family Medicine

## 2022-08-08 DIAGNOSIS — M159 Polyosteoarthritis, unspecified: Secondary | ICD-10-CM

## 2022-08-08 MED ORDER — DICLOFENAC SODIUM 1 % EX GEL: 2.0000 g | Freq: Four times a day (QID) | CUTANEOUS | 99 refills | Status: DC | PRN

## 2022-09-04 ENCOUNTER — Other Ambulatory Visit: Payer: Medicare Other

## 2022-09-04 DIAGNOSIS — N281 Cyst of kidney, acquired: Secondary | ICD-10-CM | POA: Diagnosis not present

## 2022-09-04 DIAGNOSIS — N1832 Chronic kidney disease, stage 3b: Secondary | ICD-10-CM | POA: Diagnosis not present

## 2022-09-04 DIAGNOSIS — D72829 Elevated white blood cell count, unspecified: Secondary | ICD-10-CM | POA: Diagnosis not present

## 2022-09-04 DIAGNOSIS — D638 Anemia in other chronic diseases classified elsewhere: Secondary | ICD-10-CM | POA: Diagnosis not present

## 2022-09-04 DIAGNOSIS — R7303 Prediabetes: Secondary | ICD-10-CM | POA: Diagnosis not present

## 2022-09-04 DIAGNOSIS — N200 Calculus of kidney: Secondary | ICD-10-CM | POA: Diagnosis not present

## 2022-09-13 ENCOUNTER — Other Ambulatory Visit: Payer: Self-pay | Admitting: Pulmonary Disease

## 2022-10-02 ENCOUNTER — Ambulatory Visit (INDEPENDENT_AMBULATORY_CARE_PROVIDER_SITE_OTHER): Payer: Medicare Other | Admitting: Family Medicine

## 2022-10-02 ENCOUNTER — Encounter: Payer: Self-pay | Admitting: Family Medicine

## 2022-10-02 VITALS — BP 133/76 | HR 96 | Temp 98.6°F | Ht 70.0 in | Wt 156.0 lb

## 2022-10-02 DIAGNOSIS — R5382 Chronic fatigue, unspecified: Secondary | ICD-10-CM | POA: Diagnosis not present

## 2022-10-02 DIAGNOSIS — Z974 Presence of external hearing-aid: Secondary | ICD-10-CM | POA: Diagnosis not present

## 2022-10-02 DIAGNOSIS — G8929 Other chronic pain: Secondary | ICD-10-CM

## 2022-10-02 DIAGNOSIS — R5381 Other malaise: Secondary | ICD-10-CM

## 2022-10-02 DIAGNOSIS — N1832 Chronic kidney disease, stage 3b: Secondary | ICD-10-CM

## 2022-10-02 DIAGNOSIS — M25552 Pain in left hip: Secondary | ICD-10-CM | POA: Diagnosis not present

## 2022-10-02 DIAGNOSIS — I4811 Longstanding persistent atrial fibrillation: Secondary | ICD-10-CM

## 2022-10-02 NOTE — Progress Notes (Signed)
Subjective: CC: 30-month follow-up PCP: [name], DO Charles Golden is a 89 y.o. male presenting to clinic today for:  1.  CKD 3B, atrial fibrillation Patient is compliant with all medications.  He recently had a checkup with Dr. Wolfgang Phoenix and everything was stable.  He sees Dr. Craige Cotta, his pulmonologist, next week.  Is planning to see the gastroenterologist at the [facility] in 2 weeks.  Continues to have intermittent and pretty significant abdominal pain despite fairly unremarkable workup thus far with his community gastroenterologist.  Continues to experience chronic fatigue and admits that he does not "feel good" most of the time.  He denies any depressive symptoms and is adamant that he does not have any depression but notes that he simply does not find joy and a lot of things like he used to.  He of course misses his wife.  He has children who look after him and support him well.  2.  Left hip pain Today he complains of left hip pain which has been a problem for him for quite some time.  He also has some arthritic changes in bilateral hands left greater than right.  He attributes much of this to his advancing age.  He just had a 74 birthday which was celebrated with friends and family.  He hopes to make it to 37 years old.  He has been taking Tylenol intermittently for that left hip and it does seem to help.  No NSAIDs of course due to anticoagulation for A-fib   ROS: Per HPI  Allergies  Allergen Reactions   Demerol [Meperidine] Other (See Comments)    Pt. States "he woke up during a colonoscopy"   Spiriva Handihaler [Tiotropium Bromide Monohydrate] Other (See Comments)    Mild Urinary Retention   Past Medical History:  Diagnosis Date   Acute medial meniscal tear 07/20/2014   Atrial fibrillation with RVR (HCC) 05/10/2021   Chronic headache 08/05/2013   COPD (chronic obstructive pulmonary disease) (HCC)    CVA (cerebral vascular accident) (HCC) 05/01/2021   Enlarged prostate     GERD (gastroesophageal reflux disease)    Glaucoma    Glucagonoma    Hernia, incisional    at present   [employer] (hard of hearing)    IBS (irritable bowel syndrome)    Incisional hernia, without obstruction or gangrene 08/05/2013   Sciatic pain    Stage 3b chronic kidney disease (CKD) (HCC)     Current Outpatient Medications:    apixaban (ELIQUIS) 2.5 MG TABS tablet, Take 1 tablet (2.5 mg total) by mouth 2 (two) times daily., Disp: 180 tablet, Rfl: 3   aspirin EC 81 MG tablet, Take 1 tablet (81 mg total) by mouth daily. Swallow whole., Disp: 30 tablet, Rfl: 11   atorvastatin (LIPITOR) 40 MG tablet, Take 1 tablet (40 mg total) by mouth daily., Disp: 90 tablet, Rfl: 3   Azelastine HCl 137 MCG/SPRAY SOLN, PLACE 1 SPRAY IN EACH NOSTRIL ONCE A DAY AS DIRECTED, Disp: 30 mL, Rfl: 5   azithromycin (ZITHROMAX) 250 MG tablet, Take 2 tabs on day one then 1 tab daily for four additional days, Disp: 6 tablet, Rfl: 0   Cholecalciferol (VITAMIN D3) 2000 UNITS TABS, Take 1 tablet by mouth daily., Disp: , Rfl:    CRANBERRY PO, Take by mouth., Disp: , Rfl:    Cyanocobalamin (VITAMIN B-12) 5000 MCG SUBL, Place 1 tablet under the tongue daily., Disp: , Rfl:    diclofenac Sodium (VOLTAREN) 1 % GEL, Apply  2 g topically 4 (four) times daily as needed (arthritis)., Disp: 200 g, Rfl: PRN   famotidine (PEPCID) 20 MG tablet, Take 1 tablet (20 mg total) by mouth daily., Disp: 30 tablet, Rfl: 0   finasteride (PROSCAR) 5 MG tablet, Take 5 mg by mouth daily., Disp: , Rfl:    fluticasone (FLONASE) 50 MCG/ACT nasal spray, Place 2 sprays into both nostrils daily., Disp: 16 g, Rfl: 11   guaiFENesin (MUCINEX) 600 MG 12 hr tablet, Take 2 tablets (1,200 mg total) by mouth 2 (two) times daily as needed for to loosen phlegm or cough., Disp: , Rfl:    latanoprost (XALATAN) 0.005 % ophthalmic solution, Place 1 drop into the left eye at bedtime. (Patient taking differently: Place 1 drop into both eyes at bedtime.), Disp: 2.5 mL, Rfl:  1   pantoprazole (PROTONIX) 40 MG tablet, TAKE 1 TABLET 2 TIMES A DAY BEFORE A MEAL, Disp: 180 tablet, Rfl: 1   silodosin (RAPAFLO) 8 MG CAPS capsule, Take 8 mg by mouth daily., Disp: , Rfl:    timolol (TIMOPTIC) 0.5 % ophthalmic solution, Place 1 drop into the left eye every morning., Disp: , Rfl:    TRELEGY ELLIPTA 100-62.5-25 MCG/ACT AEPB, INHALE ONE PUFF DAILY EVERY EVENING, Disp: 60 each, Rfl: 5   VENTOLIN HFA 108 (90 Base) MCG/ACT inhaler, Inhale 2 puffs into the lungs every 6 (six) hours as needed for wheezing or shortness of breath., Disp: 6.7 g, Rfl: 5 Social History   Socioeconomic History   Marital status: Married    Spouse name: [name]   Number of children: 3   Years of education: 12+   Highest education level: Bachelor's degree (e.g., BA, AB, BS)  Occupational History   Occupation: Retired    Comment: Programmer, systems  Tobacco Use   Smoking status: Former    Packs/day: 1.00    Years: 30.00    Additional pack years: 0.00    Total pack years: 30.00    Types: Cigarettes    Quit date: 05/28/2006    Years since quitting: 16.3   Smokeless tobacco: Never  Vaping Use   Vaping Use: Never used  Substance and Sexual Activity   Alcohol use: Not Currently    Alcohol/week: 0.0 standard drinks of alcohol   Drug use: No   Sexual activity: Yes    Birth control/protection: None  Other Topics Concern   Not on file  Social History Narrative   Patient lives at home with [name].    Patient has 3 children.    Patient has his BS   Patient is retired.    Drinks about 2 cups of coffee per day.      [facility]:   He is still married. He previously worked as a Programmer, systems. No significant dust exposure. He mostly worked with synthetic fibers. He is from [place].       Social Determinants of Health   Financial Resource Strain: Low Risk  (02/09/2020)   Overall Financial Resource Strain (CARDIA)    Difficulty of Paying Living Expenses: Not hard at all  Food  Insecurity: No Food Insecurity (01/01/2022)   Hunger Vital Sign    Worried About Running Out of Food in the Last Year: Never true    Ran Out of Food in the Last Year: Never true  Transportation Needs: No Transportation Needs (01/01/2022)   PRAPARE - Administrator, Civil Service (Medical): No    Lack of Transportation (Non-Medical): No  Physical Activity: Insufficiently Active (05/19/2021)   Exercise Vital Sign    Days of Exercise per Week: 1 day    Minutes of Exercise per Session: 10 min  Stress: Stress Concern Present (07/17/2021)   [employer] of Occupational Health - Occupational Stress Questionnaire    Feeling of Stress : To some extent  Social Connections: Socially Integrated (06/09/2019)   Social Connection and Isolation Panel [NHANES]    Frequency of Communication with Friends and Family: More than three times a week    Frequency of Social Gatherings with Friends and Family: More than three times a week    Attends Religious Services: More than 4 times per year    Active Member of [employer] or Organizations: Yes    Attends Engineer, structural: More than 4 times per year    Marital Status: Married  Catering manager Violence: Not At Risk (01/01/2022)   Humiliation, Afraid, Rape, and Kick questionnaire    Fear of Current or Ex-Partner: No    Emotionally Abused: No    Physically Abused: No    Sexually Abused: No   Family History  Problem Relation Age of Onset   Heart disease Mother        Valve replacement and pacemaker   Colon cancer Father    Asthma Other        cousin   Healthy Daughter    Healthy Daughter    Healthy Daughter     Objective: Office vital signs reviewed. BP 133/76   Pulse 96   Temp 98.6 F (37 C)   Ht 5\' 10"  (1.778 m)   Wt 156 lb (70.8 kg)   SpO2 100%   BMI 22.38 kg/m   Physical Examination:  General: Awake, alert, well nourished, No acute distress HEENT: Scalp with dry skin but no discrete lesions.  Sclera white. Cardio:  Irregularly irregular with rate control, S1S2 heard, no murmurs appreciated Pulm: clear to auscultation bilaterally, no wheezes, rhonchi or rales; normal work of breathing on room air MSK: Ambulating dependently.  Gait minimally antalgic Neuro: Hard of hearing  Assessment/ Plan: 89 y.o. male   Longstanding persistent atrial fibrillation (HCC)  Wears hearing aid in both ears  Physical deconditioning  Chronic fatigue  Chronic left hip pain  Rate controlled. Continue current regimen  Hearing aids present bilaterally but still had some difficulty during our conversation  Continues to have physical deconditioning and chronic fatigue.  Suspect this is due to advanced age but likely also in combination with advanced renal disease, atrial fibrillation etc.  With regards to his chronic left hip pain he is going to start taking Tylenol more regularly as this has been somewhat efficacious.  We discussed consideration for physical therapy versus referral for intra articular injection if needed going forward.  He will follow-up with me in the next 3 to 4 months, sooner if concerns arise    No orders of the defined types were placed in this encounter.  No orders of the defined types were placed in this encounter.    [name], DO [facility] [phone]

## 2022-10-08 ENCOUNTER — Encounter: Payer: Self-pay | Admitting: Family Medicine

## 2022-10-08 ENCOUNTER — Ambulatory Visit (INDEPENDENT_AMBULATORY_CARE_PROVIDER_SITE_OTHER): Payer: Medicare Other | Admitting: Pulmonary Disease

## 2022-10-08 ENCOUNTER — Encounter (HOSPITAL_BASED_OUTPATIENT_CLINIC_OR_DEPARTMENT_OTHER): Payer: Self-pay | Admitting: Pulmonary Disease

## 2022-10-08 VITALS — BP 114/62 | HR 65 | Temp 97.6°F | Ht 70.5 in | Wt 155.4 lb

## 2022-10-08 DIAGNOSIS — R0602 Shortness of breath: Secondary | ICD-10-CM

## 2022-10-08 DIAGNOSIS — J479 Bronchiectasis, uncomplicated: Secondary | ICD-10-CM

## 2022-10-08 DIAGNOSIS — J449 Chronic obstructive pulmonary disease, unspecified: Secondary | ICD-10-CM

## 2022-10-08 NOTE — Patient Instructions (Signed)
Follow up in 6 months 

## 2022-10-08 NOTE — Progress Notes (Signed)
[facility]  Chief Complaint  Patient presents with   Follow-up    Follow up.    Constitutional:  BP 114/62 (BP Location: Left Arm, Patient Position: Sitting, Cuff Size: Normal)   Pulse 65   Temp 97.6 F (36.4 C) (Oral)   Ht 5' 10.5" (1.791 m)   Wt 155 lb 6.4 oz (70.5 kg)   SpO2 98%   BMI 21.98 kg/m   Past Medical History:  Sciatica, IBS, Glaucoma, Glucagonoma, GERD, BPH, CVA, Atrial fibrillation, CKD 3b  Past Surgical History:  He  has a past surgical history that includes Hemorrhoid surgery; laparotomy (N/A, 09/04/2012); Bowel resection (N/A, 09/04/2012); Inguinal hernia repair (Left, 09/14/2013); Insertion of mesh (Left, 09/14/2013); Tonsillectomy; Knee arthroscopy (Left, 07/21/2014); Skin lesion excision; Hernia repair (Bilateral, 70's); Inguinal hernia repair (Right, 12/23/2018); Radiology with anesthesia (Left, 05/04/2021); IR US Guide Vasc Access Right (05/04/2021); IR INTRAVSC STENT CERV CAROTID W/EMB-PROT MOD SED (05/04/2021); and IR ANGIO INTRA EXTRACRAN SEL INTERNAL CAROTID UNI L MOD SED (05/04/2021).  Brief Summary:  Charles Golden is a 89 y.o. male former smoker with BTX, COPD/emphysema and GERD.      Subjective:   He has trouble hearing and his family wasn't able to come to the visit today.  I type with large font on the computer to communicate with him.  He has cough with clear sputum.  Uses mucinex.  Hasn't been using flutter valve as much.  Gets winded with minimal activity.  No having chest pain, fever, hemoptysis or leg swelling.  Uses trelegy without difficulty.  Uses albuterol intermittently.  He didn't realize he has a fib.  His main issues seems to be related to reflux, intermittent diarrhea with constipation, and abdominal discomfort with bloating.  SpO2 on room air while walking today stayed above 97%.  Physical Exam:   Appearance - well kempt   ENMT - no sinus tenderness, no oral exudate, no LAN, Mallampati 3  airway, no stridor, wears hearing aides  Respiratory - equal breath sounds bilaterally, no wheezing or rales  CV - s1s2 regular rate and rhythm, no murmurs  Ext - no clubbing, no edema  Skin - no rashes  Psych - normal mood and affect       Pulmonary testing:  A1AT 04/11/15 >> 153, MM PFT 07/13/15 >> FEV1 2.40 (92%), FEV1% 66, TLC 8.07 (112%), DLCO 75%, +BD  Chest Imaging:  CT chest 04/19/15 >> mild cylindrical BTX in bases, mild centrilobular emphysema, calcified mediastinal LN HRCT chest 03/14/21 >> diffuse bronchial thickening, scarring at bases  Sleep Tests:  PSG 11/15/16 >> AHI 3.6  Cardiac Tests:  Echo 04/28/21 >> EF 60 to 65%, grade 2 DD, mild/mod RV dilation  Social History:  He  reports that he quit smoking about 16 years ago. His smoking use included cigarettes. He has a 30.00 pack-year smoking history. He has never used smokeless tobacco. He reports that he does not currently use alcohol. He reports that he does not use drugs.  Family History:  His family history includes Asthma in an other family member; Colon cancer in his father; Healthy in his daughter, daughter, and daughter; Heart disease in his mother.     Assessment/Plan:   Bronchiectasis. - advised him to use his flutter valve more often - continue mucinex and albuterol - discussed symptoms to monitor for that would indicate he needs antibiotics  COPD with chronic bronchitis and emphysema. - continue trelegy 100 one puff daily  Allergic rhinitis. - prn azelastine, flonase  Dyspnea on exertion. - pulmonary status seems stable - advised him to f/u with his PCP and discuss whether he needs cardiac assessment  Time Spent Involved in Patient Care on Day of Examination:  37 minutes  Follow up:   Patient Instructions  Follow up in 6 months  Medication List:   Allergies as of 10/08/2022       Reactions   Demerol [meperidine] Other (See Comments)   Pt. States "he woke up during a  colonoscopy"   Spiriva Handihaler [tiotropium Bromide Monohydrate] Other (See Comments)   Mild Urinary Retention        Medication List        Accurate as of Oct 08, 2022 11:24 AM. If you have any questions, ask your nurse or doctor.          STOP taking these medications    azithromycin 250 MG tablet Commonly known as: ZITHROMAX Stopped by: Coralyn Helling, MD       TAKE these medications    apixaban 2.5 MG Tabs tablet Commonly known as: ELIQUIS Take 1 tablet (2.5 mg total) by mouth 2 (two) times daily.   aspirin EC 81 MG tablet Take 1 tablet (81 mg total) by mouth daily. Swallow whole.   atorvastatin 40 MG tablet Commonly known as: LIPITOR Take 1 tablet (40 mg total) by mouth daily.   Azelastine HCl 137 MCG/SPRAY Soln PLACE 1 SPRAY IN EACH NOSTRIL ONCE A DAY AS DIRECTED   CRANBERRY PO Take by mouth.   diclofenac Sodium 1 % Gel Commonly known as: Voltaren Apply 2 g topically 4 (four) times daily as needed (arthritis).   famotidine 20 MG tablet Commonly known as: PEPCID Take 1 tablet (20 mg total) by mouth daily.   finasteride 5 MG tablet Commonly known as: PROSCAR Take 5 mg by mouth daily.   fluticasone 50 MCG/ACT nasal spray Commonly known as: FLONASE Place 2 sprays into both nostrils daily.   guaiFENesin 600 MG 12 hr tablet Commonly known as: MUCINEX Take 2 tablets (1,200 mg total) by mouth 2 (two) times daily as needed for to loosen phlegm or cough.   latanoprost 0.005 % ophthalmic solution Commonly known as: XALATAN Place 1 drop into the left eye at bedtime. What changed: how to take this   pantoprazole 40 MG tablet Commonly known as: PROTONIX TAKE 1 TABLET 2 TIMES A DAY BEFORE A MEAL   silodosin 8 MG Caps capsule Commonly known as: RAPAFLO Take 8 mg by mouth daily.   timolol 0.5 % ophthalmic solution Commonly known as: TIMOPTIC Place 1 drop into the left eye every morning.   Trelegy Ellipta 100-62.5-25 MCG/ACT Aepb Generic drug:  Fluticasone-Umeclidin-Vilant INHALE ONE PUFF DAILY EVERY EVENING   Ventolin HFA 108 (90 Base) MCG/ACT inhaler Generic drug: albuterol Inhale 2 puffs into the lungs every 6 (six) hours as needed for wheezing or shortness of breath.   Vitamin B-12 5000 MCG Subl Place 1 tablet under the tongue daily.   Vitamin D3 50 MCG (2000 UT) Tabs Take 1 tablet by mouth daily.        Signature:  Coralyn Helling, MD [facility] Pager - [phone] 10/08/2022, 11:24 AM

## 2022-10-22 ENCOUNTER — Other Ambulatory Visit: Payer: Self-pay

## 2022-10-22 ENCOUNTER — Emergency Department (HOSPITAL_BASED_OUTPATIENT_CLINIC_OR_DEPARTMENT_OTHER)
Admission: EM | Admit: 2022-10-22 | Discharge: 2022-10-22 | Disposition: A | Discharge status: Home / Self Care | Payer: Medicare Other | Attending: Emergency Medicine | Admitting: Emergency Medicine

## 2022-10-22 ENCOUNTER — Emergency Department (HOSPITAL_BASED_OUTPATIENT_CLINIC_OR_DEPARTMENT_OTHER): Payer: Medicare Other

## 2022-10-22 ENCOUNTER — Encounter (HOSPITAL_BASED_OUTPATIENT_CLINIC_OR_DEPARTMENT_OTHER): Payer: Self-pay | Admitting: *Deleted

## 2022-10-22 DIAGNOSIS — I10 Essential (primary) hypertension: Secondary | ICD-10-CM | POA: Diagnosis not present

## 2022-10-22 DIAGNOSIS — R197 Diarrhea, unspecified: Secondary | ICD-10-CM | POA: Diagnosis not present

## 2022-10-22 DIAGNOSIS — J479 Bronchiectasis, uncomplicated: Secondary | ICD-10-CM | POA: Diagnosis not present

## 2022-10-22 DIAGNOSIS — Z7982 Long term (current) use of aspirin: Secondary | ICD-10-CM | POA: Insufficient documentation

## 2022-10-22 DIAGNOSIS — R1084 Generalized abdominal pain: Secondary | ICD-10-CM

## 2022-10-22 DIAGNOSIS — I700 Atherosclerosis of aorta: Secondary | ICD-10-CM | POA: Diagnosis not present

## 2022-10-22 DIAGNOSIS — N200 Calculus of kidney: Secondary | ICD-10-CM | POA: Diagnosis not present

## 2022-10-22 DIAGNOSIS — I701 Atherosclerosis of renal artery: Secondary | ICD-10-CM | POA: Diagnosis not present

## 2022-10-22 DIAGNOSIS — R109 Unspecified abdominal pain: Secondary | ICD-10-CM | POA: Diagnosis present

## 2022-10-22 DIAGNOSIS — K5900 Constipation, unspecified: Secondary | ICD-10-CM | POA: Insufficient documentation

## 2022-10-22 DIAGNOSIS — R140 Abdominal distension (gaseous): Secondary | ICD-10-CM

## 2022-10-22 LAB — COMPREHENSIVE METABOLIC PANEL
ALT: 12 U/L (ref 0–44)
AST: 17 U/L (ref 15–41)
Albumin: 3.8 g/dL (ref 3.5–5.0)
Alkaline Phosphatase: 86 U/L (ref 38–126)
Anion gap: 7 (ref 5–15)
BUN: 21 mg/dL (ref 8–23)
CO2: 27 mmol/L (ref 22–32)
Calcium: 8.9 mg/dL (ref 8.9–10.3)
Chloride: 106 mmol/L (ref 98–111)
Creatinine, Ser: 1.45 mg/dL — ABNORMAL HIGH (ref 0.61–1.24)
GFR, Estimated: 44 mL/min — ABNORMAL LOW (ref >60)
Glucose, Bld: 88 mg/dL (ref 70–99)
Potassium: 4.3 mmol/L (ref 3.5–5.1)
Sodium: 140 mmol/L (ref 135–145)
Total Bilirubin: 0.4 mg/dL (ref 0.3–1.2)
Total Protein: 6.5 g/dL (ref 6.5–8.1)

## 2022-10-22 LAB — CBC WITH DIFFERENTIAL/PLATELET
Abs Immature Granulocytes: 0.07 10*3/uL (ref 0.00–0.07)
Basophils Absolute: 0 10*3/uL (ref 0.0–0.1)
Basophils Relative: 0 %
Eosinophils Absolute: 0.3 10*3/uL (ref 0.0–0.5)
Eosinophils Relative: 3 %
HCT: 40.7 % (ref 39.0–52.0)
Hemoglobin: 13 g/dL (ref 13.0–17.0)
Immature Granulocytes: 1 %
Lymphocytes Relative: 25 %
Lymphs Abs: 1.9 10*3/uL (ref 0.7–4.0)
MCH: 31 pg (ref 26.0–34.0)
MCHC: 31.9 g/dL (ref 30.0–36.0)
MCV: 96.9 fL (ref 80.0–100.0)
Monocytes Absolute: 0.7 10*3/uL (ref 0.1–1.0)
Monocytes Relative: 9 %
Neutro Abs: 4.8 10*3/uL (ref 1.7–7.7)
Neutrophils Relative %: 62 %
Platelets: 292 10*3/uL (ref 150–400)
RBC: 4.2 MIL/uL — ABNORMAL LOW (ref 4.22–5.81)
RDW: 13.6 % (ref 11.5–15.5)
WBC: 7.8 10*3/uL (ref 4.0–10.5)
nRBC: 0 % (ref 0.0–0.2)

## 2022-10-22 LAB — URINALYSIS, ROUTINE W REFLEX MICROSCOPIC
Bilirubin Urine: NEGATIVE
Glucose, UA: NEGATIVE mg/dL
Hgb urine dipstick: NEGATIVE
Ketones, ur: NEGATIVE mg/dL
Leukocytes,Ua: NEGATIVE
Nitrite: NEGATIVE
Protein, ur: NEGATIVE mg/dL
Specific Gravity, Urine: 1.041 — ABNORMAL HIGH (ref 1.005–1.030)
pH: 6.5 (ref 5.0–8.0)

## 2022-10-22 LAB — LACTIC ACID, PLASMA: Lactic Acid, Venous: 1.4 mmol/L (ref 0.5–1.9)

## 2022-10-22 LAB — LIPASE, BLOOD: Lipase: 52 U/L — ABNORMAL HIGH (ref 11–51)

## 2022-10-22 MED ORDER — POLYETHYLENE GLYCOL 3350 17 G PO PACK: 17.0000 g | PACK | Freq: Every day | ORAL | 0 refills | Status: DC

## 2022-10-22 MED ORDER — ALUM & MAG HYDROXIDE-SIMETH 200-200-20 MG/5ML PO SUSP
30.0000 mL | Freq: Once | ORAL | Status: AC
  Administered 2022-10-22: 30 mL via ORAL
  Filled 2022-10-22: qty 30

## 2022-10-22 MED ORDER — ALUM & MAG HYDROXIDE-SIMETH 200-200-20 MG/5ML PO SUSP: 30.0000 mL | Freq: Once | ORAL | Status: DC

## 2022-10-22 MED ORDER — ALUM & MAG HYDROXIDE-SIMETH 400-400-40 MG/5ML PO SUSP: 15.0000 mL | Freq: Four times a day (QID) | ORAL | 0 refills | Status: DC | PRN

## 2022-10-22 MED ORDER — FLEET ENEMA 7-19 GM/118ML RE ENEM: 1.0000 | ENEMA | Freq: Every day | RECTAL | 10 refills | Status: DC | PRN

## 2022-10-22 MED ORDER — IOHEXOL 350 MG/ML SOLN
100.0000 mL | Freq: Once | INTRAVENOUS | Status: AC | PRN
  Administered 2022-10-22: 75 mL via INTRAVENOUS

## 2022-10-22 MED ORDER — FENTANYL CITRATE PF 50 MCG/ML IJ SOSY
50.0000 ug | PREFILLED_SYRINGE | Freq: Once | INTRAMUSCULAR | Status: AC
  Administered 2022-10-22: 50 ug via INTRAVENOUS
  Filled 2022-10-22: qty 1

## 2022-10-22 MED ORDER — DOCUSATE SODIUM 100 MG PO CAPS: 100.0000 mg | ORAL_CAPSULE | Freq: Two times a day (BID) | ORAL | 0 refills | Status: DC

## 2022-10-22 MED ORDER — LACTATED RINGERS IV BOLUS
1000.0000 mL | Freq: Once | INTRAVENOUS | Status: AC
  Administered 2022-10-22: 1000 mL via INTRAVENOUS

## 2022-10-22 NOTE — ED Notes (Signed)
Report given to Donaldson, RN

## 2022-10-22 NOTE — ED Notes (Signed)
Bladder scanner was 170. Dr. Clayborne Dana aware.

## 2022-10-22 NOTE — ED Notes (Signed)
To CT scan

## 2022-10-22 NOTE — ED Notes (Signed)
Dr. Clayborne Dana at bedside to discuss d/c instructions. Pt. voiced understanding.

## 2022-10-22 NOTE — ED Provider Notes (Signed)
[place] EMERGENCY DEPARTMENT AT [facility] Provider Note   CSN: [id number] Arrival date & time: 10/22/22  4098     History {Add pertinent medical, surgical, social history, OB history to HPI:1} Chief Complaint  Patient presents with   Abdominal Pain    Charles Golden is a 89 y.o. male.  89 yo gentleman with h/o Afib, BPH and diarrhea presents to the ED for abdominal and back pain. States he has had intermittent abdominal pain for at least a year. Had a 5-6 month break  but now for at least 5-6 days had decreased BM and associated with abdominal pain then last few days with right sided back pain as well. No fevers, nausea, vomiting, trauma. Does endorse increased urinary frequency but no dysuria, malodorous urine or change in appearance.    Abdominal Pain      Home Medications Prior to Admission medications   Medication Sig Start Date End Date Taking? Authorizing Provider  apixaban (ELIQUIS) 2.5 MG TABS tablet Take 1 tablet (2.5 mg total) by mouth 2 (two) times daily. 01/24/22   [name], DO  aspirin EC 81 MG tablet Take 1 tablet (81 mg total) by mouth daily. Swallow whole. 08/03/21   Micki Riley, MD  atorvastatin (LIPITOR) 40 MG tablet Take 1 tablet (40 mg total) by mouth daily. 01/24/22   Delynn Flavin M, DO  Azelastine HCl 137 MCG/SPRAY SOLN PLACE 1 SPRAY IN EACH NOSTRIL ONCE A DAY AS DIRECTED 01/03/22   Delynn Flavin M, DO  Cholecalciferol (VITAMIN D3) 2000 UNITS TABS Take 1 tablet by mouth daily.    [provider]  CRANBERRY PO Take by mouth.    [provider]  Cyanocobalamin (VITAMIN B-12) 5000 MCG SUBL Place 1 tablet under the tongue daily.    [provider]  diclofenac Sodium (VOLTAREN) 1 % GEL Apply 2 g topically 4 (four) times daily as needed (arthritis). 08/08/22   [name], DO  finasteride (PROSCAR) 5 MG tablet Take 5 mg by mouth daily.    [provider]  fluticasone (FLONASE) 50 MCG/ACT  nasal spray Place 2 sprays into both nostrils daily. 01/03/22   [name], DO  guaiFENesin (MUCINEX) 600 MG 12 hr tablet Take 2 tablets (1,200 mg total) by mouth 2 (two) times daily as needed for to loosen phlegm or cough. 09/29/21   Coralyn Helling, MD  latanoprost (XALATAN) 0.005 % ophthalmic solution Place 1 drop into the left eye at bedtime. Patient taking differently: Place 1 drop into both eyes at bedtime. 06/01/16   Ernestina Penna, MD  pantoprazole (PROTONIX) 40 MG tablet TAKE 1 TABLET 2 TIMES A DAY BEFORE A MEAL 06/08/22   Delynn Flavin M, DO  silodosin (RAPAFLO) 8 MG CAPS capsule Take 8 mg by mouth daily. 06/02/21   [provider]  timolol (TIMOPTIC) 0.5 % ophthalmic solution Place 1 drop into the left eye every morning. 11/24/18   [provider]  TRELEGY ELLIPTA 100-62.5-25 MCG/ACT AEPB INHALE ONE PUFF DAILY EVERY EVENING 09/17/22   Coralyn Helling, MD  VENTOLIN HFA 108 (90 Base) MCG/ACT inhaler Inhale 2 puffs into the lungs every 6 (six) hours as needed for wheezing or shortness of breath. 09/29/21   Coralyn Helling, MD      Allergies    Demerol [meperidine] and Spiriva handihaler [tiotropium bromide monohydrate]    Review of Systems   Review of Systems  Gastrointestinal:  Positive for abdominal pain.    Physical Exam Updated  Vital Signs BP (!) 188/88   Pulse (!) 58   Temp 97.7 F (36.5 C)   Resp 20   Ht 5\' 10"  (1.778 m)   Wt 71.2 kg   SpO2 100%   BMI 22.53 kg/m  Physical Exam Vitals and nursing note reviewed.  Constitutional:      Appearance: He is well-developed.  HENT:     Head: Normocephalic and atraumatic.  Cardiovascular:     Rate and Rhythm: Normal rate.  Pulmonary:     Effort: Pulmonary effort is normal. No respiratory distress.  Abdominal:     General: There is no distension.     Tenderness: There is abdominal tenderness in the suprapubic area. There is right CVA tenderness. There is no left CVA tenderness.  Musculoskeletal:         General: Normal range of motion.     Cervical back: Normal range of motion. No tenderness.     Thoracic back: No tenderness.     Lumbar back: No tenderness.  Neurological:     Mental Status: He is alert.     ED Results / Procedures / Treatments   Labs (all labs ordered are listed, but only abnormal results are displayed) Labs Reviewed  URINALYSIS, ROUTINE W REFLEX MICROSCOPIC  LIPASE, BLOOD  COMPREHENSIVE METABOLIC PANEL  CBC WITH DIFFERENTIAL/PLATELET  LACTIC ACID, PLASMA  LACTIC ACID, PLASMA    EKG None  Radiology No results found.  Procedures Procedures  {Document cardiac monitor, telemetry assessment procedure when appropriate:1}  Medications Ordered in ED Medications  fentaNYL (SUBLIMAZE) injection 50 mcg (has no administration in time range)  lactated ringers bolus 1,000 mL (has no administration in time range)    ED Course/ Medical Decision Making/ A&P   {   Click here for ABCD2, HEART and other calculatorsREFRESH Note before signing :1}                          Medical Decision Making Amount and/or Complexity of Data Reviewed Labs: ordered. Radiology: ordered.  Risk Prescription drug management.  Hypertensive with abdominal pain and back pain associated with increased urinary frequency suspect possible urinary retention. Will get bladder scan and labs. However, if bladder scan is normal then have to consider aortic pathology vs. Bowel obstruction. Had a CT scan done previously showing some gastric thickening so have to consider neoplasm as well. Will treat symptomatically in the mean time.  Bladder scan 170. Not likely the cause. Will await labs and ct.  ***  {Document critical care time when appropriate:1} {Document review of labs and clinical decision tools ie heart score, Chads2Vasc2 etc:1}  {Document your independent review of radiology images, and any outside records:1} {Document your discussion with family members, caretakers, and with  consultants:1} {Document social determinants of health affecting pt's care:1} {Document your decision making why or why not admission, treatments were needed:1} Final Clinical Impression(s) / ED Diagnoses Final diagnoses:  None    Rx / DC Orders ED Discharge Orders     None

## 2022-10-22 NOTE — Discharge Instructions (Signed)
There are multiple ways to improve your constipation, some options are listed below and all involve over the counter medications:   * You can try the magnesium citrate 1 bottle per day until you get results. * You can try an enema once daily as needed.  * Generally, you need to increase your fluid intake and take stool softeners twice a day until you follow-up with your primary doctor for further management. * MiraLAX is an osmotic laxative. This means that it will keep electrolytes and water in your stool and allow you to have easier bowel movements.        * One option is to take 8 capfuls of MiraLAX and put in a 32 ounce Gatorade and shake it up.  Drink 4-6 ounces per hour until you have results.  Another way is per the directions below.       * You  can take this medication up to 3 times daily as needed produce bowel movements. However while you're taking this much MiraLAX you need to make sure you are replenishing her electrolytes and water loss. He should also not take it more than 2 days in a row at this level. Generally I recommend people workup to this. For example take 1 capful of MiraLAX once a day for 2-3 days and if you do not get improvement in your bowel habits then take 1 capful twice a day for 2-3 days and if they don't get relief from this and you can take 1 capful 3 times a day for 2 days. If you start having diarrhea, decrease the amount of MiraLAX you are using until you have soft formed stools once to twice a day. Remember during this process that you need to increase your electrolytes and water intake, so oftentimes sports drinks are good for this. Some people end up taking half or one whole capful daily for the rest of her lives to have regular bowel movements you can do this as you feel is proper. 

## 2022-10-22 NOTE — ED Triage Notes (Signed)
Pt c/o lower abd and lower back pain that started 4 days ago. States that he has had abd pain for the past year and had had test completed with no real answer.states pain is worse when laying  C/o of some urinary frequency. States pain comes and goes. States back pain started 4 days ago. Took 2 tylenol pta without relief.

## 2022-10-25 ENCOUNTER — Encounter: Payer: Self-pay | Admitting: Family Medicine

## 2022-10-26 ENCOUNTER — Emergency Department (HOSPITAL_BASED_OUTPATIENT_CLINIC_OR_DEPARTMENT_OTHER)
Admission: EM | Admit: 2022-10-26 | Discharge: 2022-10-26 | Disposition: A | Discharge status: Home / Self Care | Payer: Medicare Other | Attending: Emergency Medicine | Admitting: Emergency Medicine

## 2022-10-26 ENCOUNTER — Emergency Department (HOSPITAL_BASED_OUTPATIENT_CLINIC_OR_DEPARTMENT_OTHER): Payer: Medicare Other

## 2022-10-26 ENCOUNTER — Other Ambulatory Visit: Payer: Self-pay

## 2022-10-26 DIAGNOSIS — M79652 Pain in left thigh: Secondary | ICD-10-CM | POA: Insufficient documentation

## 2022-10-26 DIAGNOSIS — M5416 Radiculopathy, lumbar region: Secondary | ICD-10-CM | POA: Diagnosis not present

## 2022-10-26 DIAGNOSIS — Z7982 Long term (current) use of aspirin: Secondary | ICD-10-CM | POA: Insufficient documentation

## 2022-10-26 DIAGNOSIS — Z7951 Long term (current) use of inhaled steroids: Secondary | ICD-10-CM | POA: Diagnosis not present

## 2022-10-26 DIAGNOSIS — N1832 Chronic kidney disease, stage 3b: Secondary | ICD-10-CM | POA: Diagnosis not present

## 2022-10-26 DIAGNOSIS — Z8673 Personal history of transient ischemic attack (TIA), and cerebral infarction without residual deficits: Secondary | ICD-10-CM | POA: Insufficient documentation

## 2022-10-26 DIAGNOSIS — Z7901 Long term (current) use of anticoagulants: Secondary | ICD-10-CM | POA: Insufficient documentation

## 2022-10-26 DIAGNOSIS — J449 Chronic obstructive pulmonary disease, unspecified: Secondary | ICD-10-CM | POA: Insufficient documentation

## 2022-10-26 DIAGNOSIS — M47816 Spondylosis without myelopathy or radiculopathy, lumbar region: Secondary | ICD-10-CM | POA: Diagnosis not present

## 2022-10-26 DIAGNOSIS — M79651 Pain in right thigh: Secondary | ICD-10-CM | POA: Insufficient documentation

## 2022-10-26 DIAGNOSIS — M79605 Pain in left leg: Secondary | ICD-10-CM | POA: Diagnosis present

## 2022-10-26 DIAGNOSIS — M5126 Other intervertebral disc displacement, lumbar region: Secondary | ICD-10-CM | POA: Diagnosis not present

## 2022-10-26 LAB — CBC WITH DIFFERENTIAL/PLATELET
Abs Immature Granulocytes: 0.02 10*3/uL (ref 0.00–0.07)
Basophils Absolute: 0 10*3/uL (ref 0.0–0.1)
Basophils Relative: 0 %
Eosinophils Absolute: 0.2 10*3/uL (ref 0.0–0.5)
Eosinophils Relative: 3 %
HCT: 37.1 % — ABNORMAL LOW (ref 39.0–52.0)
Hemoglobin: 12.4 g/dL — ABNORMAL LOW (ref 13.0–17.0)
Immature Granulocytes: 0 %
Lymphocytes Relative: 23 %
Lymphs Abs: 1.9 10*3/uL (ref 0.7–4.0)
MCH: 31.6 pg (ref 26.0–34.0)
MCHC: 33.4 g/dL (ref 30.0–36.0)
MCV: 94.6 fL (ref 80.0–100.0)
Monocytes Absolute: 0.8 10*3/uL (ref 0.1–1.0)
Monocytes Relative: 9 %
Neutro Abs: 5.3 10*3/uL (ref 1.7–7.7)
Neutrophils Relative %: 65 %
Platelets: 251 10*3/uL (ref 150–400)
RBC: 3.92 MIL/uL — ABNORMAL LOW (ref 4.22–5.81)
RDW: 13.3 % (ref 11.5–15.5)
WBC: 8.2 10*3/uL (ref 4.0–10.5)
nRBC: 0 % (ref 0.0–0.2)

## 2022-10-26 LAB — COMPREHENSIVE METABOLIC PANEL
ALT: 11 U/L (ref 0–44)
AST: 17 U/L (ref 15–41)
Albumin: 3.8 g/dL (ref 3.5–5.0)
Alkaline Phosphatase: 76 U/L (ref 38–126)
Anion gap: 8 (ref 5–15)
BUN: 21 mg/dL (ref 8–23)
CO2: 27 mmol/L (ref 22–32)
Calcium: 9.2 mg/dL (ref 8.9–10.3)
Chloride: 102 mmol/L (ref 98–111)
Creatinine, Ser: 1.65 mg/dL — ABNORMAL HIGH (ref 0.61–1.24)
GFR, Estimated: 38 mL/min — ABNORMAL LOW (ref >60)
Glucose, Bld: 108 mg/dL — ABNORMAL HIGH (ref 70–99)
Potassium: 4.7 mmol/L (ref 3.5–5.1)
Sodium: 137 mmol/L (ref 135–145)
Total Bilirubin: 0.4 mg/dL (ref 0.3–1.2)
Total Protein: 6.3 g/dL — ABNORMAL LOW (ref 6.5–8.1)

## 2022-10-26 LAB — CK: Total CK: 125 U/L (ref 49–397)

## 2022-10-26 LAB — MAGNESIUM: Magnesium: 2.7 mg/dL — ABNORMAL HIGH (ref 1.7–2.4)

## 2022-10-26 MED ORDER — HYDROMORPHONE HCL 1 MG/ML IJ SOLN
1.0000 mg | INTRAMUSCULAR | Status: DC | PRN
  Administered 2022-10-26: 1 mg via INTRAVENOUS
  Filled 2022-10-26: qty 1

## 2022-10-26 MED ORDER — ACETAMINOPHEN 500 MG PO TABS
1000.0000 mg | ORAL_TABLET | Freq: Once | ORAL | Status: AC
  Administered 2022-10-26: 1000 mg via ORAL
  Filled 2022-10-26: qty 2

## 2022-10-26 MED ORDER — SODIUM CHLORIDE 0.9 % IV BOLUS
500.0000 mL | Freq: Once | INTRAVENOUS | Status: AC
  Administered 2022-10-26: 500 mL via INTRAVENOUS

## 2022-10-26 MED ORDER — DIAZEPAM 5 MG PO TABS
5.0000 mg | ORAL_TABLET | Freq: Once | ORAL | Status: AC
  Administered 2022-10-26: 5 mg via ORAL
  Filled 2022-10-26: qty 1

## 2022-10-26 MED ORDER — HYDROMORPHONE HCL 1 MG/ML IJ SOLN
0.5000 mg | INTRAMUSCULAR | Status: DC | PRN
  Administered 2022-10-26: 0.5 mg via INTRAVENOUS
  Filled 2022-10-26: qty 1

## 2022-10-26 MED ORDER — DIAZEPAM 5 MG PO TABS: 5.0000 mg | ORAL_TABLET | Freq: Two times a day (BID) | ORAL | 0 refills | Status: DC | PRN

## 2022-10-26 MED ORDER — DEXAMETHASONE 4 MG PO TABS
6.0000 mg | ORAL_TABLET | Freq: Once | ORAL | Status: AC
  Administered 2022-10-26: 6 mg via ORAL
  Filled 2022-10-26: qty 2

## 2022-10-26 MED ORDER — METHYLPREDNISOLONE 4 MG PO TBPK: ORAL_TABLET | ORAL | 0 refills | Status: DC

## 2022-10-26 MED ORDER — FENTANYL CITRATE PF 50 MCG/ML IJ SOSY
50.0000 ug | PREFILLED_SYRINGE | Freq: Once | INTRAMUSCULAR | Status: AC
  Administered 2022-10-26: 50 ug via INTRAVENOUS
  Filled 2022-10-26: qty 1

## 2022-10-26 MED ORDER — METHOCARBAMOL 1000 MG/10ML IJ SOLN
1000.0000 mg | Freq: Once | INTRAMUSCULAR | Status: AC
  Administered 2022-10-26: 1000 mg via INTRAVENOUS
  Filled 2022-10-26: qty 10

## 2022-10-26 NOTE — ED Notes (Signed)
Patient transported to MRI 

## 2022-10-26 NOTE — ED Notes (Signed)
Pt reports continued bilateral leg pain, noted to be stretching in stretcher.

## 2022-10-26 NOTE — ED Notes (Signed)
Returned from MRI 

## 2022-10-26 NOTE — ED Notes (Signed)
Patient ambulatory to restroom with staff assist.

## 2022-10-26 NOTE — Discharge Instructions (Signed)
You can take extra strength Tylenol 2 tablets every 6 hours for pain and you have already had Valium earlier today but you can take another dose this evening which will be 2 times a day.  Also you were given a steroid Dosepak which she would start tomorrow because you already had a dose today.  Some of the medications you received today can be constipating.  If you start having constipation you can try MiraLAX, Dulcolax or Colace as needed.  Also the Valium or muscle relaxers to be used as needed.  If you are feeling well and not having significant pain or spasm you do not have to take it.  It will be important for you to follow-up with neurosurgery if you continue to have issues as you may be able to get an injection.  If you start having inability to lift her leg, inability to urinate or having bowel movements on yourself you should return immediately.  Also sometimes heating pad can be helpful for muscle rubs.

## 2022-10-26 NOTE — ED Provider Notes (Signed)
Assumed care from Dr. Eudelia Bunch at 7 AM.  Patient here with burning bilateral upper leg pain that started about 24 hours ago.  History of scoliosis in the past.  No prior back surgeries.  Patient is able to ambulate and strength is preserved.  After muscle relaxers patient's pain does seem to be improved.  MRI results per radiology show a multilevel lumbar spondylolysis most notable at L3-4 where there is mild to moderate spinal cord stenosis and compression of the transversing bilateral L4 nerve roots in the lateral recesses which were worse than prior as well is severe right neuroforaminal narrowing at L5-S1 and moderate left neural foraminal narrowing at L1-2 both worse than prior.  Feel that this is explanation for patient's pain.  Discussed the findings with the patient and his daughter who is present at bedside.  At this time does not appear to need acute surgical intervention.  Patient does live at home alone but does have a walker at home.  One of his family members can stay with him over the next few days as had a long discussion how muscle relaxers or pain medications can be sedating.  Will do Tylenol and Valium as well as a Medrol Dosepak.  Discussed possible constipation from medications that were given today but patient will stop taking magnesium citrate.  He will use a stool softener or MiraLAX as needed.  Did discuss return precautions.  They are comfortable with this plan.  At this time no social barriers affecting patient's discharge.   Gwyneth Sprout, MD 10/26/22 [phone]

## 2022-10-26 NOTE — ED Notes (Signed)
Attempted to get temperature. Daughter at bedside and requested to not get it taken at this moment because the patient is sleeping.

## 2022-10-26 NOTE — ED Provider Notes (Signed)
[place] EMERGENCY DEPARTMENT AT [facility] Provider Note  CSN: [id number] Arrival date & time: 10/26/22 0300  Chief Complaint(s) Leg Pain  HPI Charles Golden is a 89 y.o. male with h/o AF and prior CVA on Eliquis, HLD on statin, COPD  The history is provided by the patient.  Leg Pain Location:  Leg Leg location:  L upper leg and R upper leg Pain details:    Quality:  Aching and burning   Radiates to:  Does not radiate   Severity:  Moderate   Onset quality:  Gradual   Duration:  2 days   Timing:  Constant   Progression:  Waxing and waning Chronicity:  Recurrent (but worse today) Relieved by:  Nothing Worsened by:  Nothing Ineffective treatments:  Acetaminophen Associated symptoms: back pain   Associated symptoms: no decreased ROM, no fever, no muscle weakness, no neck pain, no numbness, no stiffness, no swelling and no tingling    Denies any bladder/bowel incontinence. No lower extremity weakness or loss of sensation. No fall/trauma.  Patient seen 4 days ago for abdominal discomfort. DC with laxatives (miralax). Daughter picked up magnesium citrate instead. Patient drank entire bottle on day 1 at once. Had drank 2nd bottle on day 2 and 3.  During work up, CT was notable for moderate to severe atherosclerosis to numerus arteries including renal and iliac.  Past Medical History Past Medical History:  Diagnosis Date   Acute medial meniscal tear 07/20/2014   Atrial fibrillation with RVR (HCC) 05/10/2021   Chronic headache 08/05/2013   COPD (chronic obstructive pulmonary disease) (HCC)    CVA (cerebral vascular accident) (HCC) 05/01/2021   Enlarged prostate    GERD (gastroesophageal reflux disease)    Glaucoma    Glucagonoma    Hernia, incisional    at present   [facility] (hard of hearing)    IBS (irritable bowel syndrome)    Incisional hernia, without obstruction or gangrene 08/05/2013   Sciatic pain    Stage 3b chronic kidney disease (CKD) (HCC)    Patient  Active Problem List   Diagnosis Date Noted   COPD with acute exacerbation (HCC) 07/02/2022   Atopic dermatitis 07/02/2022   Atrial fibrillation (HCC) 05/10/2021   Cerebrovascular accident (CVA) due to vascular stenosis (HCC) 05/01/2021   Stenosis of left carotid artery    Chronic tension-type headache, not intractable 12/13/2020   Bilateral sensorineural hearing loss 09/25/2019   Bronchiectasis with acute exacerbation (HCC) 05/27/2019   Vitamin D deficiency 01/21/2019   Weakness 01/21/2019   Recurrent right inguinal hernia    Presbycusis of both ears 07/19/2017   Restless leg syndrome 11/25/2016   Collagenous colitis 07/24/2016   Deviated nasal septum 09/03/2015   Basal cell carcinoma of nose 09/03/2015   Thoracic aortic atherosclerosis (HCC) 05/03/2015   GERD without esophagitis 04/11/2015   COPD GOLD I  12/08/2013   Small bowel volvulus (HCC) 08/05/2013   Left inguinal hernia 08/05/2013   Obstructive chronic bronchitis without exacerbation (HCC) 04/30/2013   Chronic left hip pain 04/29/2013   BPH (benign prostatic hyperplasia) 01/05/2013   IBS (irritable bowel syndrome) 09/03/2012   Chronic rhinosinusitis 09/03/2012   Lung nodules 05/04/2003   Home Medication(s) Prior to Admission medications   Medication Sig Start Date End Date Taking? Authorizing Provider  alum & mag hydroxide-simeth (MAALOX PLUS) 400-400-40 MG/5ML suspension Take 15 mLs by mouth every 6 (six) hours as needed (gas). 10/22/22   Mesner, Barbara Cower, MD  apixaban (ELIQUIS) 2.5 MG TABS tablet Take 1  tablet (2.5 mg total) by mouth 2 (two) times daily. 01/24/22   [name], DO  aspirin EC 81 MG tablet Take 1 tablet (81 mg total) by mouth daily. Swallow whole. 08/03/21   Micki Riley, MD  atorvastatin (LIPITOR) 40 MG tablet Take 1 tablet (40 mg total) by mouth daily. 01/24/22   Delynn Flavin M, DO  Azelastine HCl 137 MCG/SPRAY SOLN PLACE 1 SPRAY IN EACH NOSTRIL ONCE A DAY AS DIRECTED 01/03/22   Delynn Flavin  M, DO  Cholecalciferol (VITAMIN D3) 2000 UNITS TABS Take 1 tablet by mouth daily.    [provider]  CRANBERRY PO Take by mouth.    [provider]  Cyanocobalamin (VITAMIN B-12) 5000 MCG SUBL Place 1 tablet under the tongue daily.    [provider]  diclofenac Sodium (VOLTAREN) 1 % GEL Apply 2 g topically 4 (four) times daily as needed (arthritis). 08/08/22   [name], DO  docusate sodium (COLACE) 100 MG capsule Take 1 capsule (100 mg total) by mouth every 12 (twelve) hours. 10/22/22   Mesner, Barbara Cower, MD  finasteride (PROSCAR) 5 MG tablet Take 5 mg by mouth daily.    [provider]  fluticasone (FLONASE) 50 MCG/ACT nasal spray Place 2 sprays into both nostrils daily. 01/03/22   [name], DO  guaiFENesin (MUCINEX) 600 MG 12 hr tablet Take 2 tablets (1,200 mg total) by mouth 2 (two) times daily as needed for to loosen phlegm or cough. 09/29/21   Coralyn Helling, MD  latanoprost (XALATAN) 0.005 % ophthalmic solution Place 1 drop into the left eye at bedtime. Patient taking differently: Place 1 drop into both eyes at bedtime. 06/01/16   Ernestina Penna, MD  pantoprazole (PROTONIX) 40 MG tablet TAKE 1 TABLET 2 TIMES A DAY BEFORE A MEAL 06/08/22   Gottschalk, Kathie Rhodes M, DO  polyethylene glycol (MIRALAX / GLYCOLAX) 17 g packet Take 17 g by mouth daily. 10/22/22   Mesner, Barbara Cower, MD  silodosin (RAPAFLO) 8 MG CAPS capsule Take 8 mg by mouth daily. 06/02/21   [provider]  sodium phosphate (FLEET) 7-19 GM/118ML ENEM Place 133 mLs (1 enema total) rectally daily as needed for severe constipation. 10/22/22   Mesner, Barbara Cower, MD  timolol (TIMOPTIC) 0.5 % ophthalmic solution Place 1 drop into the left eye every morning. 11/24/18   [provider]  TRELEGY ELLIPTA 100-62.5-25 MCG/ACT AEPB INHALE ONE PUFF DAILY EVERY EVENING 09/17/22   Coralyn Helling, MD  VENTOLIN HFA 108 (90 Base) MCG/ACT inhaler Inhale 2 puffs into the lungs every 6 (six) hours as needed for  wheezing or shortness of breath. 09/29/21   Coralyn Helling, MD                                                                                                                                    Allergies Demerol [meperidine] and Spiriva handihaler [tiotropium bromide monohydrate]  Review  of Systems Review of Systems  Constitutional:  Negative for fever.  Musculoskeletal:  Positive for back pain. Negative for neck pain and stiffness.   As noted in HPI  Physical Exam Vital Signs  I have reviewed the triage vital signs BP (!) 141/95   Pulse 63   Temp 98 F (36.7 C) (Oral)   Resp 18   SpO2 97%   Physical Exam Cardiovascular:     Rate and Rhythm: Rhythm irregularly irregular.     Pulses:          Dorsalis pedis pulses are 1+ on the right side and 1+ on the left side.  Musculoskeletal:     Lumbar back: Spasms present. Scoliosis present.       Back:     Right hip: No bony tenderness or crepitus. Normal range of motion. Normal strength.     Left hip: No bony tenderness or crepitus. Normal range of motion. Normal strength.     Right upper leg: Tenderness present. No bony tenderness.     Left upper leg: Tenderness present. No bony tenderness.     Right foot: Normal pulse.     Left foot: Normal pulse.       Legs:  Neurological:     Comments: Spine Exam: Strength: 5/5 throughout LE bilaterally (hip flexion/extension, adduction/abduction; knee flexion/extension; foot dorsiflexion/plantarflexion, inversion/eversion; great toe inversion) Sensation: Intact to light touch in proximal and distal LE bilaterally Reflexes: 1+ quadriceps and no clonus Gait: baseline ataxia     ED Results and Treatments Labs (all labs ordered are listed, but only abnormal results are displayed) Labs Reviewed  CBC WITH DIFFERENTIAL/PLATELET - Abnormal; Notable for the following components:      Result Value   RBC 3.92 (*)    Hemoglobin 12.4 (*)    HCT 37.1 (*)    All other components within normal limits   COMPREHENSIVE METABOLIC PANEL - Abnormal; Notable for the following components:   Glucose, Bld 108 (*)    Creatinine, Ser 1.65 (*)    Total Protein 6.3 (*)    GFR, Estimated 38 (*)    All other components within normal limits  MAGNESIUM - Abnormal; Notable for the following components:   Magnesium 2.7 (*)    All other components within normal limits  CK                                                                                                                         EKG  EKG Interpretation  Date/Time:    Ventricular Rate:    PR Interval:    QRS Duration:   QT Interval:    QTC Calculation:   R Axis:     Text Interpretation:         Radiology No results found.  Medications Ordered in ED Medications  HYDROmorphone (DILAUDID) injection 1 mg (1 mg Intravenous Given 10/26/22 0648)  acetaminophen (TYLENOL) tablet 1,000 mg (1,000 mg Oral Given 10/26/22 0403)  methocarbamol (ROBAXIN) injection  1,000 mg (1,000 mg Intravenous Given 10/26/22 0404)  sodium chloride 0.9 % bolus 500 mL (0 mLs Intravenous Stopped 10/26/22 0540)  fentaNYL (SUBLIMAZE) injection 50 mcg (50 mcg Intravenous Given 10/26/22 0446)  diazepam (VALIUM) tablet 5 mg (5 mg Oral Given 10/26/22 0647)  dexamethasone (DECADRON) tablet 6 mg (6 mg Oral Given 10/26/22 1610)   Procedures Procedures  (including critical care time) Medical Decision Making / ED Course   Medical Decision Making Amount and/or Complexity of Data Reviewed Labs: ordered. Radiology: ordered.  Risk OTC drugs. Prescription drug management.    Bilateral thigh pain  DDX: Muscle strain/spasm, lumbosacral radiculopathy, neurogenic claudication, electrolyte derangement from mag citrate use/diarrhea, rhambo. Less concerned for cauda equina, acute arterial occlusion.   Clinical Course as of 10/26/22 0714  Fri Oct 26, 2022  0345 Patient provided with oral Tylenol, IV Robaxin, IV fluids [PC]  0457 CBC without leukocytosis or anemia. CMP  without significant electrolyte derangements.  Mild renal insufficiency without evidence of AKI. Mag level elevated at 2.7 CK normal at 125 [PC]  0458 No significant relief following the above. Given a dose of IV fentanyl [PC]  0523 Dilaudid ordered for additional pain control  [PC]  [phone] Patient reported relief following Dilaudid. [PC]  [id number] Pain returned. Will give oral Valium for muscle relaxer.  MRI machine should be here today. Will order to assess for severe bilateral stenosis.  [PC]  [id number] Patient care turned over to oncoming provider. Patient case and results discussed in detail; please see their note for further ED managment.   [PC]    Clinical Course User Index [PC] Laci Frenkel, Amadeo Garnet, MD    Final Clinical Impression(s) / ED Diagnoses Final diagnoses:  Bilateral thigh pain    This chart was dictated using voice recognition software.  Despite best efforts to proofread,  errors can occur which can change the documentation meaning.    Nira Conn, MD 10/26/22 [phone]

## 2022-10-26 NOTE — ED Triage Notes (Signed)
Pt reporting that he has had "leg burning for 24 hours" Burning in both legs. Pt said he was seen recently for abdominal pain, has been taking prescribed medications. Has had some loose stool since taking laxatives.

## 2022-10-28 ENCOUNTER — Encounter (HOSPITAL_BASED_OUTPATIENT_CLINIC_OR_DEPARTMENT_OTHER): Payer: Self-pay | Admitting: *Deleted

## 2022-10-28 ENCOUNTER — Emergency Department (HOSPITAL_BASED_OUTPATIENT_CLINIC_OR_DEPARTMENT_OTHER)
Admission: EM | Admit: 2022-10-28 | Discharge: 2022-10-28 | Disposition: A | Discharge status: Home / Self Care | Payer: Medicare Other | Attending: Emergency Medicine | Admitting: Emergency Medicine

## 2022-10-28 ENCOUNTER — Other Ambulatory Visit: Payer: Self-pay

## 2022-10-28 DIAGNOSIS — Z7951 Long term (current) use of inhaled steroids: Secondary | ICD-10-CM | POA: Diagnosis not present

## 2022-10-28 DIAGNOSIS — Z8673 Personal history of transient ischemic attack (TIA), and cerebral infarction without residual deficits: Secondary | ICD-10-CM | POA: Diagnosis not present

## 2022-10-28 DIAGNOSIS — J449 Chronic obstructive pulmonary disease, unspecified: Secondary | ICD-10-CM | POA: Diagnosis not present

## 2022-10-28 DIAGNOSIS — M79651 Pain in right thigh: Secondary | ICD-10-CM | POA: Diagnosis present

## 2022-10-28 DIAGNOSIS — Z7982 Long term (current) use of aspirin: Secondary | ICD-10-CM | POA: Diagnosis not present

## 2022-10-28 DIAGNOSIS — I4891 Unspecified atrial fibrillation: Secondary | ICD-10-CM | POA: Insufficient documentation

## 2022-10-28 DIAGNOSIS — M5416 Radiculopathy, lumbar region: Secondary | ICD-10-CM | POA: Insufficient documentation

## 2022-10-28 DIAGNOSIS — Z7901 Long term (current) use of anticoagulants: Secondary | ICD-10-CM | POA: Diagnosis not present

## 2022-10-28 MED ORDER — HYDROMORPHONE HCL 1 MG/ML IJ SOLN
1.0000 mg | Freq: Once | INTRAMUSCULAR | Status: AC
  Administered 2022-10-28: 1 mg via INTRAVENOUS
  Filled 2022-10-28: qty 1

## 2022-10-28 MED ORDER — FENTANYL CITRATE PF 50 MCG/ML IJ SOSY
50.0000 ug | PREFILLED_SYRINGE | Freq: Once | INTRAMUSCULAR | Status: AC
  Administered 2022-10-28: 50 ug via INTRAVENOUS
  Filled 2022-10-28: qty 1

## 2022-10-28 MED ORDER — GABAPENTIN 100 MG PO CAPS: 200.0000 mg | ORAL_CAPSULE | Freq: Three times a day (TID) | ORAL | 0 refills | Status: DC

## 2022-10-28 MED ORDER — OXYCODONE-ACETAMINOPHEN 5-325 MG PO TABS: 1.0000 | ORAL_TABLET | Freq: Four times a day (QID) | ORAL | 0 refills | Status: DC | PRN

## 2022-10-28 MED ORDER — OXYCODONE-ACETAMINOPHEN 5-325 MG PO TABS
1.0000 | ORAL_TABLET | Freq: Once | ORAL | Status: AC
  Administered 2022-10-28: 1 via ORAL
  Filled 2022-10-28: qty 1

## 2022-10-28 MED ORDER — GABAPENTIN 100 MG PO CAPS
200.0000 mg | ORAL_CAPSULE | Freq: Once | ORAL | Status: AC
  Administered 2022-10-28: 200 mg via ORAL
  Filled 2022-10-28: qty 2

## 2022-10-28 NOTE — ED Notes (Signed)
Dc instructions reviewed with patient. Patient voiced understanding. Dc with belongings.  °

## 2022-10-28 NOTE — ED Triage Notes (Signed)
Pt reports ongoing leg pain. Recently seen for the same. Last took valium around 0600.

## 2022-10-28 NOTE — ED Provider Notes (Signed)
[place] EMERGENCY DEPARTMENT AT [facility] Provider Note   CSN: [id number] Arrival date & time: 10/28/22  8469     History  Chief Complaint  Patient presents with   Leg Pain    Charles Golden is a 89 y.o. male.  Pt is a 89y/o male with h/o AF and prior CVA on Eliquis, HLD on statin, COPD, scoliosis who was seen in the emergency room 2 days ago for development of burning aching pain in his bilateral lower extremities mostly in the thigh area.  At that time patient had lab work done which was relatively unchanged and MRI that showed impingement bilaterally of L4.  Patient was better initially with medications in the emergency room and was discharged home with Valium and prednisone.  He has been taking those at home and reports on the rest of Friday he felt good and slept well but the pain returned on Saturday and it does not seem to be controlled with the Valium and prednisone.  When he woke up today the pain was still there and reports that he really could not get sleep last night because he was so uncomfortable.  He has no weakness in his legs or numbness.  He has not had any bowel or bladder incontinence.  His daughter reports this morning after taking the Valium he was a bit wobbly but had done well with that yesterday.  There has not been any confusion or mental status changes.  Patient reports he just wants something to make the pain go away.  The history is provided by the patient and a relative.  Leg Pain      Home Medications Prior to Admission medications   Medication Sig Start Date End Date Taking? Authorizing Provider  gabapentin (NEURONTIN) 100 MG capsule Take 2 capsules (200 mg total) by mouth 3 (three) times daily. 10/28/22  Yes Gwyneth Sprout, MD  oxyCODONE-acetaminophen (PERCOCET/ROXICET) 5-325 MG tablet Take 1 tablet by mouth every 6 (six) hours as needed for severe pain. 10/28/22  Yes Gwyneth Sprout, MD  alum & mag hydroxide-simeth (MAALOX PLUS) 400-400-40  MG/5ML suspension Take 15 mLs by mouth every 6 (six) hours as needed (gas). 10/22/22   Mesner, Barbara Cower, MD  apixaban (ELIQUIS) 2.5 MG TABS tablet Take 1 tablet (2.5 mg total) by mouth 2 (two) times daily. 01/24/22   [name], DO  aspirin EC 81 MG tablet Take 1 tablet (81 mg total) by mouth daily. Swallow whole. 08/03/21   Micki Riley, MD  atorvastatin (LIPITOR) 40 MG tablet Take 1 tablet (40 mg total) by mouth daily. 01/24/22   Delynn Flavin M, DO  Azelastine HCl 137 MCG/SPRAY SOLN PLACE 1 SPRAY IN EACH NOSTRIL ONCE A DAY AS DIRECTED 01/03/22   Delynn Flavin M, DO  Cholecalciferol (VITAMIN D3) 2000 UNITS TABS Take 1 tablet by mouth daily.    [provider]  CRANBERRY PO Take by mouth.    [provider]  Cyanocobalamin (VITAMIN B-12) 5000 MCG SUBL Place 1 tablet under the tongue daily.    [provider]  diazepam (VALIUM) 5 MG tablet Take 1 tablet (5 mg total) by mouth every 12 (twelve) hours as needed for muscle spasms. 10/26/22   Gwyneth Sprout, MD  diclofenac Sodium (VOLTAREN) 1 % GEL Apply 2 g topically 4 (four) times daily as needed (arthritis). 08/08/22   [name], DO  docusate sodium (COLACE) 100 MG capsule Take 1 capsule (100 mg total) by mouth every 12 (twelve) hours.  10/22/22   Mesner, Barbara Cower, MD  finasteride (PROSCAR) 5 MG tablet Take 5 mg by mouth daily.    [provider]  fluticasone (FLONASE) 50 MCG/ACT nasal spray Place 2 sprays into both nostrils daily. 01/03/22   [name], DO  guaiFENesin (MUCINEX) 600 MG 12 hr tablet Take 2 tablets (1,200 mg total) by mouth 2 (two) times daily as needed for to loosen phlegm or cough. 09/29/21   Coralyn Helling, MD  latanoprost (XALATAN) 0.005 % ophthalmic solution Place 1 drop into the left eye at bedtime. Patient taking differently: Place 1 drop into both eyes at bedtime. 06/01/16   Ernestina Penna, MD  methylPREDNISolone (MEDROL DOSEPAK) 4 MG TBPK tablet Take according to package  instructions 10/26/22   Gwyneth Sprout, MD  pantoprazole (PROTONIX) 40 MG tablet TAKE 1 TABLET 2 TIMES A DAY BEFORE A MEAL 06/08/22   Gottschalk, Kathie Rhodes M, DO  polyethylene glycol (MIRALAX / GLYCOLAX) 17 g packet Take 17 g by mouth daily. 10/22/22   Mesner, Barbara Cower, MD  silodosin (RAPAFLO) 8 MG CAPS capsule Take 8 mg by mouth daily. 06/02/21   [provider]  sodium phosphate (FLEET) 7-19 GM/118ML ENEM Place 133 mLs (1 enema total) rectally daily as needed for severe constipation. 10/22/22   Mesner, Barbara Cower, MD  timolol (TIMOPTIC) 0.5 % ophthalmic solution Place 1 drop into the left eye every morning. 11/24/18   [provider]  TRELEGY ELLIPTA 100-62.5-25 MCG/ACT AEPB INHALE ONE PUFF DAILY EVERY EVENING 09/17/22   Coralyn Helling, MD  VENTOLIN HFA 108 (90 Base) MCG/ACT inhaler Inhale 2 puffs into the lungs every 6 (six) hours as needed for wheezing or shortness of breath. 09/29/21   Coralyn Helling, MD      Allergies    Demerol [meperidine] and Spiriva handihaler [tiotropium bromide monohydrate]    Review of Systems   Review of Systems  Physical Exam Updated Vital Signs BP 129/65   Pulse (!) 49   Temp 97.7 F (36.5 C) (Oral)   Resp 18   SpO2 94%  Physical Exam Vitals and nursing note reviewed.  HENT:     Head: Normocephalic and atraumatic.  Cardiovascular:     Rate and Rhythm: Normal rate.  Pulmonary:     Effort: Pulmonary effort is normal.  Abdominal:     General: Abdomen is flat.     Palpations: Abdomen is soft.     Tenderness: There is no abdominal tenderness.  Musculoskeletal:        General: No tenderness.     Cervical back: Normal range of motion and neck supple.  Skin:    General: Skin is warm.  Neurological:     Mental Status: He is alert. Mental status is at baseline.     Comments: Patient is able to stand and walk.  5 out of 5 strength in bilateral lower extremities with normal sensation.    Psychiatric:        Mood and Affect: Mood normal.     ED Results  / Procedures / Treatments   Labs (all labs ordered are listed, but only abnormal results are displayed) Labs Reviewed - No data to display  EKG None  Radiology No results found.  Procedures Procedures    Medications Ordered in ED Medications  fentaNYL (SUBLIMAZE) injection 50 mcg (50 mcg Intravenous Given 10/28/22 0752)  gabapentin (NEURONTIN) capsule 200 mg (200 mg Oral Given 10/28/22 0752)  HYDROmorphone (DILAUDID) injection 1 mg (1 mg Intravenous Given 10/28/22 1002)  oxyCODONE-acetaminophen (PERCOCET/ROXICET)  5-325 MG per tablet 1 tablet (1 tablet Oral Given 10/28/22 1002)    ED Course/ Medical Decision Making/ A&P                             Medical Decision Making Risk Prescription drug management. Parenteral controlled substances.   Pt with multiple medical problems and comorbidities and presenting today with a complaint that caries a high risk for morbidity.  Returning today due to ongoing radicular pain in the bilateral lower extremities.  Patient is having no weakness noted in the legs.  He is able to stand and walk.  He is just very uncomfortable from the pain.  Valium and prednisone are not helping with his pain.  I saw this patient 2 days ago in the emergency room and was wary to give him other pain medications due to the additive effects and possible side effects in an elderly gentleman however this combination does not seem to be working.  Imaging and lab work done 2 days ago were reassuring except for the nerve compression.  Do not feel that patient needs more lab work or imaging.  Will attempt pain control with a different combination.  Patient's daughter is present with the patient in the room and is understanding of this.  Discussed in length with the patient about the nature of nerve pain, realistic expectations of improvement of the pain and importance of follow-up with his PCP.  12:21 PM Patient is still having pain and given additional pain medication.  12:21  PM Patient's symptoms are better now.  Will switch patient over to gabapentin 3 times daily and Percocet as needed.  Patient's daughter is present here with him as well and all the risk of these medications were discussed.  Valium was discontinued.  He is having some constipation and discussed a bowel regime.  Also continue to encourage follow-up with his PCP.        Final Clinical Impression(s) / ED Diagnoses Final diagnoses:  Lumbar radiculopathy, acute    Rx / DC Orders ED Discharge Orders          Ordered    oxyCODONE-acetaminophen (PERCOCET/ROXICET) 5-325 MG tablet  Every 6 hours PRN        10/28/22 1139    gabapentin (NEURONTIN) 100 MG capsule  3 times daily        10/28/22 1139              Gwyneth Sprout, MD 10/28/22 1221

## 2022-10-28 NOTE — Discharge Instructions (Addendum)
We are going to change the medications you are taking to try to help with the pain.  I want you to stop the Valium but finish out the prednisone.  You can start taking the Percocet every 6 hours as needed for pain.  The next dose you can have would be at 4 PM this afternoon.  Also the gabapentin you can take 3 times a day and your next dose for that can be anytime after 12 PM.  If you are not having any bowel movements you can drink the rest of the bottle of the magnesium citrate you have at home to get things going but then continue with the stool softener or MiraLAX 1 or 2 scoops to keep your bowels moving.  All the medications you are receiving can cause constipation.  All of these medicines also can be sedating and make you unsteady on your feet so make sure you are having assistance when you are getting up and moving around.

## 2022-10-29 ENCOUNTER — Other Ambulatory Visit (HOSPITAL_BASED_OUTPATIENT_CLINIC_OR_DEPARTMENT_OTHER): Payer: Self-pay

## 2022-10-29 MED FILL — Fluticasone-Umeclidinium-Vilanterol AEPB 100-62.5-25 MCG/ACT: RESPIRATORY_TRACT | 30 days supply | Qty: 60 | Fill #0 | Status: CN

## 2022-10-30 ENCOUNTER — Encounter: Payer: Self-pay | Admitting: Family Medicine

## 2022-10-30 ENCOUNTER — Ambulatory Visit (INDEPENDENT_AMBULATORY_CARE_PROVIDER_SITE_OTHER): Payer: Medicare Other | Admitting: Family Medicine

## 2022-10-30 VITALS — BP 155/75 | HR 53 | Temp 97.0°F | Resp 20 | Ht 70.0 in | Wt 158.0 lb

## 2022-10-30 DIAGNOSIS — M5416 Radiculopathy, lumbar region: Secondary | ICD-10-CM

## 2022-10-30 DIAGNOSIS — K5909 Other constipation: Secondary | ICD-10-CM | POA: Diagnosis not present

## 2022-10-30 MED ORDER — GABAPENTIN 100 MG PO CAPS
100.0000 mg | ORAL_CAPSULE | Freq: Three times a day (TID) | ORAL | 0 refills | Status: DC
Start: 2022-10-30 — End: 2022-11-05

## 2022-10-30 NOTE — Progress Notes (Signed)
Subjective:  Patient ID: Charles Golden, male    DOB: [birth year], 89 y.o.   MRN: [id number]  Patient Care Team: [name], DO as PCP - General (Family Medicine) Bjorn Pippin, MD (Urology) Sharrell Ku, MD (Gastroenterology) Ernesto Rutherford, MD (Ophthalmology) Ollen Gross, MD as Consulting Physician (Orthopedic Surgery) Lupita Leash, MD as Consulting Physician (Pulmonary Disease) York Spaniel, MD (Inactive) as Consulting Physician (Neurology) Coralyn Helling, MD as Consulting Physician (Pulmonary Disease) Weyman Croon Radiology, MD as Rounding Team (Interventional Radiology) Randa Spike Kelton Pillar, LCSW as [facility] Care Management (Licensed Clinical Social Worker) Clinton Gallant, RN as [facility] Care Management Shirleen Schirmer, PA-C as Physician Assistant (Internal Medicine) Christinia Gully, AUD (Audiology)   Chief Complaint:  Hospitalization Follow-up (Issues with constipation/)   HPI: Charles Golden is a 89 y.o. male presenting on 10/30/2022 for Hospitalization Follow-up (Issues with constipation/)  Pt presents today for evaluation after several ED visits over the last few weeks for constipation and lower back pain with radiculopathy. 05/27/20024 ED visit revealed constipation. He was treated with mag citrate which was very beneficial. Does report some constipation. States he is taking miralax but not drinking enough water. 10/26/2022 and 10/28/2022 visits for lower back pain. MRI on 10/28/2022 revealed:  1. Multilevel lumbar spondylosis, most notable at L3-4 where there is mild-to-moderate spinal canal stenosis and compression of the traversing bilateral L4 nerve roots in the lateral recesses, worse from prior. 2. Severe right neural foraminal narrowing at L5-S1, worse from prior. 3. Moderate left neural foraminal narrowing at L1-2, worse from prior. He was treated with fentanyl and dilaudid during ED visits and sent  home with valium, oxycodone, and gabapentin 200 mg three times daily. He was also placed on a prednisone taper pack. He has improved slightly since ED visit. He does report fatigue and dizziness / unsteady gait with the medications. Valium stopped yesterday. Still taking gabapentin and oxycodone. States he still has pain in his lower back that radiates down leg. No saddle anesthesia or loss of bowel/bladder.     Relevant past medical, surgical, family, and social history reviewed and updated as indicated.  Allergies and medications reviewed and updated. Data reviewed: Chart in Epic.   Past Medical History:  Diagnosis Date   Acute medial meniscal tear 07/20/2014   Atrial fibrillation with RVR (HCC) 05/10/2021   Chronic headache 08/05/2013   COPD (chronic obstructive pulmonary disease) (HCC)    CVA (cerebral vascular accident) (HCC) 05/01/2021   Enlarged prostate    GERD (gastroesophageal reflux disease)    Glaucoma    Glucagonoma    Hernia, incisional    at present   [facility] (hard of hearing)    IBS (irritable bowel syndrome)    Incisional hernia, without obstruction or gangrene 08/05/2013   Sciatic pain    Stage 3b chronic kidney disease (CKD) (HCC)     Past Surgical History:  Procedure Laterality Date   BOWEL RESECTION N/A 09/04/2012   Procedure: SMALL BOWEL RESECTION;  Surgeon: Fabio Bering, MD;  Location: [facility];  Service: General;  Laterality: N/A;  Anastimosis   HEMORRHOID SURGERY     HERNIA REPAIR Bilateral 70's   INGUINAL HERNIA REPAIR Left 09/14/2013   Procedure: LEFT INGUINAL HERNIORRHAPHY;  Surgeon: Dalia Heading, MD;  Location: [facility];  Service: General;  Laterality: Left;   INGUINAL HERNIA REPAIR Right 12/23/2018   Procedure: RECURRENT RIGHT INGUINAL HERNIA  REPAIR  WITH MESH;  Surgeon: Franky Macho,  MD;  Location: [facility];  Service: General;  Laterality: Right;   INSERTION OF MESH Left 09/14/2013   Procedure: INSERTION OF MESH;  Surgeon: Dalia Heading, MD;  Location:  [facility];  Service: General;  Laterality: Left;   IR ANGIO INTRA EXTRACRAN SEL INTERNAL CAROTID UNI L MOD SED  05/04/2021   IR INTRAVSC STENT CERV CAROTID W/EMB-PROT MOD SED INCL ANGIO  05/04/2021   IR US GUIDE VASC ACCESS RIGHT  05/04/2021   KNEE ARTHROSCOPY Left 07/21/2014   Procedure: LEFT KNEE ARTHROSCOPY WITH MEDIAL MENISCAL DEBRIDEMENT ;  Surgeon: Loanne Drilling, MD;  Location: [facility];  Service: Orthopedics;  Laterality: Left;   LAPAROTOMY N/A 09/04/2012   Procedure: EXPLORATORY LAPAROTOMY;  Surgeon: Fabio Bering, MD;  Location: [facility];  Service: General;  Laterality: N/A;   RADIOLOGY WITH ANESTHESIA Left 05/04/2021   Procedure: LEFT ICA STENT;  Surgeon: Baldemar Lenis, MD;  Location: [facility];  Service: Radiology;  Laterality: Left;   SKIN LESION EXCISION     Dr Lazarus Salines   TONSILLECTOMY      Social History   Socioeconomic History   Marital status: Married    Spouse name: [name]   Number of children: 3   Years of education: 12+   Highest education level: Bachelor's degree (e.g., BA, AB, BS)  Occupational History   Occupation: Retired    Comment: Programmer, systems  Tobacco Use   Smoking status: Former    Packs/day: 1.00    Years: 30.00    Additional pack years: 0.00    Total pack years: 30.00    Types: Cigarettes    Quit date: 05/28/2006    Years since quitting: 16.4   Smokeless tobacco: Never  Vaping Use   Vaping Use: Never used  Substance and Sexual Activity   Alcohol use: Not Currently    Alcohol/week: 0.0 standard drinks of alcohol   Drug use: No   Sexual activity: Yes    Birth control/protection: None  Other Topics Concern   Not on file  Social History Narrative   Patient lives at home with [name].    Patient has 3 children.    Patient has his BS   Patient is retired.    Drinks about 2 cups of coffee per day.      [facility]:   He is still married. He previously worked as a Programmer, systems. No significant dust exposure. He mostly  worked with synthetic fibers. He is from [place].       Social Determinants of Health   Financial Resource Strain: Low Risk  (02/09/2020)   Overall Financial Resource Strain (CARDIA)    Difficulty of Paying Living Expenses: Not hard at all  Food Insecurity: No Food Insecurity (01/01/2022)   Hunger Vital Sign    Worried About Running Out of Food in the Last Year: Never true    Ran Out of Food in the Last Year: Never true  Transportation Needs: No Transportation Needs (01/01/2022)   PRAPARE - Administrator, Civil Service (Medical): No    Lack of Transportation (Non-Medical): No  Physical Activity: Insufficiently Active (05/19/2021)   Exercise Vital Sign    Days of Exercise per Week: 1 day    Minutes of Exercise per Session: 10 min  Stress: Stress Concern Present (07/17/2021)   [employer] of Occupational Health - Occupational Stress Questionnaire    Feeling of Stress : To some extent  Social Connections: Socially Integrated (06/09/2019)  Social Advertising account executive [NHANES]    Frequency of Communication with Friends and Family: More than three times a week    Frequency of Social Gatherings with Friends and Family: More than three times a week    Attends Religious Services: More than 4 times per year    Active Member of [employer] or Organizations: Yes    Attends Engineer, structural: More than 4 times per year    Marital Status: Married  Catering manager Violence: Not At Risk (01/01/2022)   Humiliation, Afraid, Rape, and Kick questionnaire    Fear of Current or Ex-Partner: No    Emotionally Abused: No    Physically Abused: No    Sexually Abused: No    Outpatient Encounter Medications as of 10/30/2022  Medication Sig   alum & mag hydroxide-simeth (MAALOX PLUS) 400-400-40 MG/5ML suspension Take 15 mLs by mouth every 6 (six) hours as needed (gas).   apixaban (ELIQUIS) 2.5 MG TABS tablet Take 1 tablet (2.5 mg total) by mouth 2 (two) times daily.   aspirin  EC 81 MG tablet Take 1 tablet (81 mg total) by mouth daily. Swallow whole.   atorvastatin (LIPITOR) 40 MG tablet Take 1 tablet (40 mg total) by mouth daily.   Azelastine HCl 137 MCG/SPRAY SOLN PLACE 1 SPRAY IN EACH NOSTRIL ONCE A DAY AS DIRECTED   Cholecalciferol (VITAMIN D3) 2000 UNITS TABS Take 1 tablet by mouth daily.   CRANBERRY PO Take by mouth.   Cyanocobalamin (VITAMIN B-12) 5000 MCG SUBL Place 1 tablet under the tongue daily.   diclofenac Sodium (VOLTAREN) 1 % GEL Apply 2 g topically 4 (four) times daily as needed (arthritis).   docusate sodium (COLACE) 100 MG capsule Take 1 capsule (100 mg total) by mouth every 12 (twelve) hours.   finasteride (PROSCAR) 5 MG tablet Take 5 mg by mouth daily.   fluticasone (FLONASE) 50 MCG/ACT nasal spray Place 2 sprays into both nostrils daily.   Fluticasone-Umeclidin-Vilant (TRELEGY ELLIPTA) 100-62.5-25 MCG/ACT AEPB Inhale 1 puff into the lungs every evening.   guaiFENesin (MUCINEX) 600 MG 12 hr tablet Take 2 tablets (1,200 mg total) by mouth 2 (two) times daily as needed for to loosen phlegm or cough.   latanoprost (XALATAN) 0.005 % ophthalmic solution Place 1 drop into the left eye at bedtime. (Patient taking differently: Place 1 drop into both eyes at bedtime.)   methylPREDNISolone (MEDROL DOSEPAK) 4 MG TBPK tablet Take according to package instructions   oxyCODONE-acetaminophen (PERCOCET/ROXICET) 5-325 MG tablet Take 1 tablet by mouth every 6 (six) hours as needed for severe pain.   pantoprazole (PROTONIX) 40 MG tablet TAKE 1 TABLET 2 TIMES A DAY BEFORE A MEAL   polyethylene glycol (MIRALAX / GLYCOLAX) 17 g packet Take 17 g by mouth daily.   silodosin (RAPAFLO) 8 MG CAPS capsule Take 8 mg by mouth daily.   sodium phosphate (FLEET) 7-19 GM/118ML ENEM Place 133 mLs (1 enema total) rectally daily as needed for severe constipation.   timolol (TIMOPTIC) 0.5 % ophthalmic solution Place 1 drop into the left eye every morning.   VENTOLIN HFA 108 (90 Base)  MCG/ACT inhaler Inhale 2 puffs into the lungs every 6 (six) hours as needed for wheezing or shortness of breath.   [DISCONTINUED] diazepam (VALIUM) 5 MG tablet Take 1 tablet (5 mg total) by mouth every 12 (twelve) hours as needed for muscle spasms.   [DISCONTINUED] gabapentin (NEURONTIN) 100 MG capsule Take 2 capsules (200 mg total) by mouth 3 (three) times  daily.   gabapentin (NEURONTIN) 100 MG capsule Take 1 capsule (100 mg total) by mouth 3 (three) times daily.   No facility-administered encounter medications on file as of 10/30/2022.    Allergies  Allergen Reactions   Demerol [Meperidine] Other (See Comments)    Pt. States "he woke up during a colonoscopy"   Spiriva Handihaler [Tiotropium Bromide Monohydrate] Other (See Comments)    Mild Urinary Retention    Review of Systems  Constitutional:  Positive for fatigue. Negative for activity change, appetite change, chills, diaphoresis, fever and unexpected weight change.  HENT:  Positive for hearing loss.   Eyes: Negative.  Negative for photophobia and visual disturbance.  Respiratory:  Negative for cough, chest tightness and shortness of breath.   Cardiovascular:  Negative for chest pain, palpitations and leg swelling.  Gastrointestinal:  Positive for constipation. Negative for abdominal distention, abdominal pain, anal bleeding, blood in stool, diarrhea, nausea, rectal pain and vomiting.  Endocrine: Negative.   Genitourinary:  Negative for decreased urine volume, difficulty urinating, dysuria, frequency and urgency.  Musculoskeletal:  Positive for arthralgias and back pain. Negative for gait problem, joint swelling, myalgias, neck pain and neck stiffness.  Skin: Negative.   Allergic/Immunologic: Negative.   Neurological:  Positive for dizziness (with medications). Negative for tremors, seizures, syncope, facial asymmetry, speech difficulty, weakness, light-headedness, numbness and headaches.  Hematological: Negative.    Psychiatric/Behavioral:  Negative for confusion, hallucinations, sleep disturbance and suicidal ideas.   All other systems reviewed and are negative.       Objective:  BP (!) 155/75   Pulse (!) 53   Temp (!) 97 F (36.1 C) (Temporal)   Resp 20   Ht 5\' 10"  (1.778 m)   Wt 158 lb (71.7 kg)   SpO2 95%   BMI 22.67 kg/m    Wt Readings from Last 3 Encounters:  10/30/22 158 lb (71.7 kg)  10/22/22 157 lb (71.2 kg)  10/08/22 155 lb 6.4 oz (70.5 kg)    Physical Exam Vitals and nursing note reviewed.  Constitutional:      General: He is not in acute distress.    Appearance: He is normal weight. He is not ill-appearing, toxic-appearing or diaphoretic.  HENT:     Head: Normocephalic and atraumatic.     Right Ear: Decreased hearing noted.     Left Ear: Decreased hearing noted.     Ears:     Comments: Bilateral hearing aids    Mouth/Throat:     Mouth: Mucous membranes are moist.  Eyes:     Pupils: Pupils are equal, round, and reactive to light.  Cardiovascular:     Rate and Rhythm: Normal rate and regular rhythm.     Heart sounds: Normal heart sounds.  Pulmonary:     Breath sounds: Rhonchi (scattered, clears with cough) present.  Musculoskeletal:     Thoracic back: Normal.     Lumbar back: Tenderness present. No swelling, edema, deformity, signs of trauma, lacerations, spasms or bony tenderness. Decreased range of motion. Negative right straight leg raise test and negative left straight leg raise test. No scoliosis.     Right hip: Normal.     Left hip: Normal.     Right lower leg: No edema.     Left lower leg: No edema.  Skin:    General: Skin is warm and dry.     Capillary Refill: Capillary refill takes less than 2 seconds.  Neurological:     Mental Status: He is alert and oriented  to person, place, and time. Mental status is at baseline.  Psychiatric:        Mood and Affect: Mood normal.        Behavior: Behavior normal.        Thought Content: Thought content normal.         Judgment: Judgment normal.     Results for orders placed or performed during the hospital encounter of 10/26/22  CBC with Differential  Result Value Ref Range   WBC 8.2 4.0 - 10.5 K/uL   RBC 3.92 (L) 4.22 - 5.81 MIL/uL   Hemoglobin 12.4 (L) 13.0 - 17.0 g/dL   HCT 16.1 (L) 09.6 - 04.5 %   MCV 94.6 80.0 - 100.0 fL   MCH 31.6 26.0 - 34.0 pg   MCHC 33.4 30.0 - 36.0 g/dL   RDW 40.9 81.1 - 91.4 %   Platelets 251 150 - 400 K/uL   nRBC 0.0 0.0 - 0.2 %   Neutrophils Relative % 65 %   Neutro Abs 5.3 1.7 - 7.7 K/uL   Lymphocytes Relative 23 %   Lymphs Abs 1.9 0.7 - 4.0 K/uL   Monocytes Relative 9 %   Monocytes Absolute 0.8 0.1 - 1.0 K/uL   Eosinophils Relative 3 %   Eosinophils Absolute 0.2 0.0 - 0.5 K/uL   Basophils Relative 0 %   Basophils Absolute 0.0 0.0 - 0.1 K/uL   Immature Granulocytes 0 %   Abs Immature Granulocytes 0.02 0.00 - 0.07 K/uL  Comprehensive metabolic panel  Result Value Ref Range   Sodium 137 135 - 145 mmol/L   Potassium 4.7 3.5 - 5.1 mmol/L   Chloride 102 98 - 111 mmol/L   CO2 27 22 - 32 mmol/L   Glucose, Bld 108 (H) 70 - 99 mg/dL   BUN 21 8 - 23 mg/dL   Creatinine, Ser 7.82 (H) 0.61 - 1.24 mg/dL   Calcium 9.2 8.9 - 95.6 mg/dL   Total Protein 6.3 (L) 6.5 - 8.1 g/dL   Albumin 3.8 3.5 - 5.0 g/dL   AST 17 15 - 41 U/L   ALT 11 0 - 44 U/L   Alkaline Phosphatase 76 38 - 126 U/L   Total Bilirubin 0.4 0.3 - 1.2 mg/dL   GFR, Estimated 38 (L) >60 mL/min   Anion gap 8 5 - 15  CK  Result Value Ref Range   Total CK 125 49 - 397 U/L  Magnesium  Result Value Ref Range   Magnesium 2.7 (H) 1.7 - 2.4 mg/dL       Pertinent labs & imaging results that were available during my care of the patient were reviewed by me and considered in my medical decision making.  Assessment & Plan:  Kean was seen today for hospitalization follow-up.  Diagnoses and all orders for this visit:  Lumbar radiculopathy Imaging from ED visit reviewed in detail with pt. Pts  medications adjusted due to dizziness and unsteady gait. Stop Valium. Decrease gabapentin to 100 mg three times daily and can slowly wean off as tolerated. Only take 1/2 oxycodone if needed for severe pain. Referral to PT placed. No red flags concerning for cauda equina syndrome. Discussed conservative therapy in detail due to risk/benefits. Pt agrees to plan.  -     Ambulatory referral to Physical Therapy -     gabapentin (NEURONTIN) 100 MG capsule; Take 1 capsule (100 mg total) by mouth 3 (three) times daily.  Chronic constipation Continue miralax but need to increase water  intake. Add Mag Citrate 1/2 bottle twice weekly, can increase if needed.     Continue all other maintenance medications.  Follow up plan: Return if symptoms worsen or fail to improve.   Continue healthy lifestyle choices, including diet (rich in fruits, vegetables, and lean proteins, and low in salt and simple carbohydrates) and exercise (at least 30 minutes of moderate physical activity daily).  Educational handout given for radicular pain   The above assessment and management plan was discussed with the patient. The patient verbalized understanding of and has agreed to the management plan. Patient is aware to call the clinic if they develop any new symptoms or if symptoms persist or worsen. Patient is aware when to return to the clinic for a follow-up visit. Patient educated on when it is appropriate to go to the emergency department.   Kari Baars, FNP-C [facility] [phone]

## 2022-10-30 NOTE — Patient Instructions (Signed)
Decrease gabapentin to 1 tablet three times daily, taper down slowly daily as tolerated.  Oxycodone only as needed for severe pain.  Miralax daily with lots of water.   Can take Mag Citrate 1/2 bottle twice weekly as needed for constipation.

## 2022-11-01 ENCOUNTER — Telehealth: Payer: Self-pay

## 2022-11-01 NOTE — Telephone Encounter (Signed)
Transition Care Management Follow-up Telephone Call Date of discharge and from where: 10/28/2022 [facility] How have you been since you were released from the hospital? Patient is still in pain. Any questions or concerns? No  Items Reviewed: Did the pt receive and understand the discharge instructions provided? Yes  Medications obtained and verified? Yes  Other? No  Any new allergies since your discharge? No  Dietary orders reviewed? Yes Do you have support at home? Yes   Follow up appointments reviewed:  PCP Hospital f/u appt confirmed? Yes  Scheduled to see Gilford Silvius, FNP  on 10/30/2022 @ [facility]. Specialist Hospital f/u appt confirmed? Yes  Scheduled to see Bari Mantis, PT on 11/02/2022 @ [facility]. Are transportation arrangements needed? No  If their condition worsens, is the pt aware to call PCP or go to the Emergency Dept.? Yes Was the patient provided with contact information for the PCP's office or ED? Yes Was to pt encouraged to call back with questions or concerns? Yes  Ignatius Kloos [facility]  [facility] Population Health Community Resource Care Guide   [email or web address]  ?? [phone]   Website: [email or web address]  [email or web address]

## 2022-11-02 ENCOUNTER — Other Ambulatory Visit: Payer: Self-pay

## 2022-11-02 ENCOUNTER — Encounter: Payer: Self-pay | Admitting: Family Medicine

## 2022-11-02 ENCOUNTER — Ambulatory Visit: Payer: Medicare Other | Attending: Family Medicine

## 2022-11-02 ENCOUNTER — Telehealth: Payer: Self-pay | Admitting: Family Medicine

## 2022-11-02 DIAGNOSIS — M5416 Radiculopathy, lumbar region: Secondary | ICD-10-CM | POA: Diagnosis not present

## 2022-11-02 DIAGNOSIS — M6281 Muscle weakness (generalized): Secondary | ICD-10-CM | POA: Insufficient documentation

## 2022-11-02 DIAGNOSIS — M48062 Spinal stenosis, lumbar region with neurogenic claudication: Secondary | ICD-10-CM

## 2022-11-02 DIAGNOSIS — M47819 Spondylosis without myelopathy or radiculopathy, site unspecified: Secondary | ICD-10-CM

## 2022-11-02 NOTE — Telephone Encounter (Signed)
This was prescribed by the ER. Medically ok to do but I suspect he will run out of his medication.  Additionally, this medication worsens constipation. Just FYI.  Does he need a referral to pain management?

## 2022-11-02 NOTE — Therapy (Addendum)
 OUTPATIENT PHYSICAL THERAPY THORACOLUMBAR EVALUATION   Patient Name: Charles Golden MRN: [id number] DOB:22-Mar-1926, 89 y.o., male Today's Date: 11/02/2022  END OF SESSION:  PT End of Session - 11/02/22 0848     Visit Number 1    Number of Visits 6    Date for PT Re-Evaluation 12/21/22    PT Start Time 0849    PT Stop Time 0936    PT Time Calculation (min) 47 min    Activity Tolerance Patient limited by pain    Behavior During Therapy [facility] for tasks assessed/performed   Patient experienced intermittent difficulty answering questions and describing his symptoms.            Past Medical History:  Diagnosis Date   Acute medial meniscal tear 07/20/2014   Atrial fibrillation with RVR (HCC) 05/10/2021   Chronic headache 08/05/2013   COPD (chronic obstructive pulmonary disease) (HCC)    CVA (cerebral vascular accident) (HCC) 05/01/2021   Enlarged prostate    GERD (gastroesophageal reflux disease)    Glaucoma    Glucagonoma    Hernia, incisional    at present   [facility] (hard of hearing)    IBS (irritable bowel syndrome)    Incisional hernia, without obstruction or gangrene 08/05/2013   Sciatic pain    Stage 3b chronic kidney disease (CKD) (HCC)    Past Surgical History:  Procedure Laterality Date   BOWEL RESECTION N/A 09/04/2012   Procedure: SMALL BOWEL RESECTION;  Surgeon: Lovena Rubinstein, MD;  Location: [facility];  Service: General;  Laterality: N/A;  Anastimosis   HEMORRHOID SURGERY     HERNIA REPAIR Bilateral 70's   INGUINAL HERNIA REPAIR Left 09/14/2013   Procedure: LEFT INGUINAL HERNIORRHAPHY;  Surgeon: Beau Bound, MD;  Location: [facility];  Service: General;  Laterality: Left;   INGUINAL HERNIA REPAIR Right 12/23/2018   Procedure: RECURRENT RIGHT INGUINAL HERNIA  REPAIR  WITH MESH;  Surgeon: Alanda Allegra, MD;  Location: [facility];  Service: General;  Laterality: Right;   INSERTION OF MESH Left 09/14/2013   Procedure: INSERTION OF MESH;  Surgeon: Beau Bound, MD;  Location: [facility];  Service: General;  Laterality: Left;   IR ANGIO INTRA EXTRACRAN SEL INTERNAL CAROTID UNI L MOD SED  05/04/2021   IR INTRAVSC STENT CERV CAROTID W/EMB-PROT MOD SED INCL ANGIO  05/04/2021   IR US  GUIDE VASC ACCESS RIGHT  05/04/2021   KNEE ARTHROSCOPY Left 07/21/2014   Procedure: LEFT KNEE ARTHROSCOPY WITH MEDIAL MENISCAL DEBRIDEMENT ;  Surgeon: Aurther Blue, MD;  Location: [facility];  Service: Orthopedics;  Laterality: Left;   LAPAROTOMY N/A 09/04/2012   Procedure: EXPLORATORY LAPAROTOMY;  Surgeon: Lovena Rubinstein, MD;  Location: [facility];  Service: General;  Laterality: N/A;   RADIOLOGY WITH ANESTHESIA Left 05/04/2021   Procedure: LEFT ICA STENT;  Surgeon: de Macedo Rodrigues, Katyucia, MD;  Location: [facility] OR;  Service: Radiology;  Laterality: Left;   SKIN LESION EXCISION     Dr Archer Kobs   TONSILLECTOMY     Patient Active Problem List   Diagnosis Date Noted   Atopic dermatitis 07/02/2022   Atrial fibrillation (HCC) 05/10/2021   Cerebrovascular accident (CVA) due to vascular stenosis (HCC) 05/01/2021   Stenosis of left carotid artery    Chronic tension-type headache, not intractable 12/13/2020   Bilateral sensorineural hearing loss 09/25/2019   Bronchiectasis with acute exacerbation (HCC) 05/27/2019   Vitamin D deficiency 01/21/2019   Weakness 01/21/2019   Recurrent right inguinal hernia  Presbycusis of both ears 07/19/2017   Restless leg syndrome 11/25/2016   Collagenous colitis 07/24/2016   Deviated nasal septum 09/03/2015   Basal cell carcinoma of nose 09/03/2015   Thoracic aortic atherosclerosis (HCC) 05/03/2015   GERD without esophagitis 04/11/2015   COPD GOLD I  12/08/2013   Small bowel volvulus (HCC) 08/05/2013   Left inguinal hernia 08/05/2013   Obstructive chronic bronchitis without exacerbation (HCC) 04/30/2013   Chronic left hip pain 04/29/2013   BPH (benign prostatic hyperplasia) 01/05/2013   IBS (irritable bowel syndrome) 09/03/2012   Chronic rhinosinusitis 09/03/2012    Lung nodules 05/04/2003    PCP: Eliodoro Guerin, DO  REFERRING PROVIDER: Galvin Jules, FNP   REFERRING DIAG: Lumbar radiculopathy   Rationale for Evaluation and Treatment: Rehabilitation  THERAPY DIAG:  Radiculopathy, lumbar region  Muscle weakness (generalized)  ONSET DATE: about 2 weeks  SUBJECTIVE:                                                                                                                                                                                           SUBJECTIVE STATEMENT: Patient reports that he has been to the emergency department 3 times in the past 2 weeks. He had a MRI on the second visit which found a pinched nerve at his L4. He has pain in both legs and it has not let up. He notes that it has not let up and is hurting really bad today. He has never had any pain like this before. He notes that the pain gets so bad that he can barely sleep at night.   PERTINENT HISTORY:  History of a CVA, atrial fibrillation, COPD, hard of hearing, and chronic kidney disease  PAIN:  Are you having pain? Yes: NPRS scale: 100/10 Pain location: both hips and legs Pain description: constant aching, burning pain Aggravating factors: none reported Relieving factors: cold (slightly)    PRECAUTIONS: None  WEIGHT BEARING RESTRICTIONS: No  FALLS:  Has patient fallen in last 6 months? No  LIVING ENVIRONMENT: Lives with: lives alone Lives in: House/apartment Stairs: Yes: Internal: 1 steps; none Has following equipment at home: Walker - 2 wheeled  OCCUPATION: retired  PLOF: Independent  PATIENT GOALS: reduced pain and be able to sleep better at night  NEXT MD VISIT: 01/30/23  OBJECTIVE:   DIAGNOSTIC FINDINGS:   IMPRESSION: 1. Multilevel lumbar spondylosis, most notable at L3-4 where there is mild-to-moderate spinal canal stenosis and compression of the traversing bilateral L4 nerve roots in the lateral recesses, worse from prior. 2. Severe  right neural foraminal narrowing at L5-S1, worse from prior. 3. Moderate left neural foraminal  narrowing at L1-2, worse from prior.  SCREENING FOR RED FLAGS: Bowel or bladder incontinence: No Spinal tumors: No Cauda equina syndrome: No Compression fracture: No Abdominal aneurysm: No  COGNITION: Overall cognitive status: Within functional limits for tasks assessed     SENSATION: Light touch: Impaired  and diminished sensation in L2-3 in bilateral lower extremities Patient reports no numbness or tingling  POSTURE: rounded shoulders, forward head, decreased lumbar lordosis, and increased thoracic kyphosis  PALPATION: Patient reported soreness to palpation throughout BLE   LUMBAR ROM: assessed in sitting  AROM eval  Flexion WFL   Extension   Right lateral flexion   Left lateral flexion   Right rotation 75% limitation  Left rotation 75% limitation   (Blank rows = not tested)  LOWER EXTREMITY ROM: WFL for activities assessed  LOWER EXTREMITY MMT:    MMT Right eval Left eval  Hip flexion 4-/5 3+/5  Hip extension    Hip abduction    Hip adduction    Hip internal rotation    Hip external rotation    Knee flexion    Knee extension 3/5 3/5  Ankle dorsiflexion    Ankle plantarflexion    Ankle inversion    Ankle eversion     (Blank rows = not tested)  GAIT: Assistive device utilized: Environmental consultant - 2 wheeled Level of assistance: Modified independence Comments: flexed trunk, decreased stride length and gait speed  TODAY'S TREATMENT:                                                                                                                              DATE:     PATIENT EDUCATION:  Education details: prognosis, plan of care, MRI results, and goals for therapy Person educated: Patient and Child(ren) Education method: Explanation Education comprehension: verbalized understanding  HOME EXERCISE PROGRAM:   ASSESSMENT:  CLINICAL IMPRESSION: Patient is a 89 y.o.  male who was seen today for physical therapy evaluation and treatment for acute lumbar radiculopathy.  He presented with high pain severity and irritability with none of today's assessments significantly aggravating or alleviating his familiar symptoms.  This high pain severity limited his ability to complete special testing and other objective assessments.  He may benefit from additional medical intervention to manage his pain prior to continuing with skilled physical therapy.   PHYSICAL THERAPY DISCHARGE SUMMARY  Visits from Start of Care: 1  Current functional level related to goals / functional outcomes: Patient did not return following his initial evaluation   Remaining deficits: See evaluation   Education / Equipment: See education   Patient agrees to discharge. Patient goals were not met. Patient is being discharged due to not returning since the last visit.  Glendora Landsman, PT, DPT    OBJECTIVE IMPAIRMENTS: Abnormal gait, decreased activity tolerance, decreased mobility, difficulty walking, decreased ROM, decreased strength, hypomobility, impaired sensation, impaired tone, postural dysfunction, and pain.   ACTIVITY LIMITATIONS: carrying, lifting, bending, sitting, standing, squatting, sleeping, stairs,  transfers, and locomotion level  PARTICIPATION LIMITATIONS: meal prep, cleaning, and laundry  PERSONAL FACTORS: Age, Behavior pattern, Fitness, Transportation, and 3+ comorbidities: History of a CVA, atrial fibrillation, COPD, hard of hearing, and chronic kidney disease  are also affecting patient's functional outcome.   REHAB POTENTIAL: Fair    CLINICAL DECISION MAKING: Unstable/unpredictable  EVALUATION COMPLEXITY: High   GOALS: Goals reviewed with patient? Yes  LONG TERM GOALS: Target date: 11/23/22  Patient will be independent with his HEP. Baseline:  Goal status: INITIAL  2.  Patient will be able to complete his daily activities without his familiar pain exceeding  8/10. Baseline:  Goal status: INITIAL  3.  Patient will report being able to sleep for at least 1 hour prior to being awakened by his familiar symptoms. Baseline:  Goal status: INITIAL  4.  Patient will be able to safely ambulate at least 80 feet without an assistive device for improved household mobility. Baseline:  Goal status: INITIAL  PLAN:  PT FREQUENCY: 1-2x/week  PT DURATION: 3 weeks  PLANNED INTERVENTIONS: Therapeutic exercises, Therapeutic activity, Neuromuscular re-education, Balance training, Gait training, Patient/Family education, Self Care, Joint mobilization, Stair training, Electrical stimulation, Spinal mobilization, Cryotherapy, Moist heat, Ultrasound, Manual therapy, and Re-evaluation.  PLAN FOR NEXT SESSION: isometrics, manual therapy, and modalities as needed   Lane Pinon, PT 11/02/2022, 12:44 PM

## 2022-11-02 NOTE — Telephone Encounter (Signed)
Refer to mychart message

## 2022-11-02 NOTE — Addendum Note (Signed)
Addended by: [name] on: 11/02/2022 02:21 PM   Modules accepted: Orders

## 2022-11-02 NOTE — Telephone Encounter (Signed)
I legally cannot send a class 2 narcotic since I did not see this patient.  I recommend he try and use ONLY if absolutely needed or keep at 1/2 tablet.

## 2022-11-02 NOTE — Telephone Encounter (Signed)
Hey, I can def place referral.  If they are willing to drive to [place], I think we can get him seen next week.  Is this something his daughter can do?

## 2022-11-02 NOTE — Telephone Encounter (Signed)
REFERRAL REQUEST Telephone Note  Have you been seen at our office for this problem? YES Physical therapist suggested that pt see a specialist for back due to pinched nerve. To be able to explore that back more due to serve pain (Advise that they may need an appointment with their PCP before a referral can be done)  Reason for Referral: Physical therapist suggested that pt see a specialist for back due to pinched nerve. To be able to explore that back more due to serve pain Referral discussed with patient: physical therapist  Best contact number of patient for referral team: [phone]    Has patient been seen by a specialist for this issue before: no  Patient provider preference for referral: [facility] Patient location preference for referral: [facility]   Patient notified that referrals can take up to a week or longer to process. If they haven't heard anything within a week they should call back and speak with the referral department.

## 2022-11-05 ENCOUNTER — Telehealth: Payer: Self-pay | Admitting: Family Medicine

## 2022-11-05 ENCOUNTER — Telehealth (INDEPENDENT_AMBULATORY_CARE_PROVIDER_SITE_OTHER): Payer: Medicare Other | Admitting: Family Medicine

## 2022-11-05 ENCOUNTER — Encounter: Payer: Self-pay | Admitting: Family Medicine

## 2022-11-05 DIAGNOSIS — M48062 Spinal stenosis, lumbar region with neurogenic claudication: Secondary | ICD-10-CM | POA: Diagnosis not present

## 2022-11-05 DIAGNOSIS — M5416 Radiculopathy, lumbar region: Secondary | ICD-10-CM

## 2022-11-05 MED ORDER — OXYCODONE-ACETAMINOPHEN 5-325 MG PO TABS
1.0000 | ORAL_TABLET | Freq: Three times a day (TID) | ORAL | 0 refills | Status: DC | PRN
Start: 2022-11-05 — End: 2022-11-20

## 2022-11-05 MED ORDER — GABAPENTIN 300 MG PO CAPS
300.0000 mg | ORAL_CAPSULE | Freq: Three times a day (TID) | ORAL | 1 refills | Status: DC
Start: 2022-11-05 — End: 2022-12-18

## 2022-11-05 NOTE — Telephone Encounter (Signed)
He has 100mg  capsules.  His daughter self titrated him to 200mg  3 times daily.  He said he was still in pain.  I discussed with them that we would put him on MAX dose for his renal function which is the 300mg  3 times per day. So this will be taking a SINGLE 300mg  capsule 3 times daily.  He may use 3 of the 100mg  to equal this dose until he picks up the new rx if he wants.

## 2022-11-05 NOTE — Telephone Encounter (Signed)
So again, please offer this patient a VIDEO visit and I will be GLAD to oblige a refill.  It again is a class 2 narcotic that is federally controlled and REQUIRES a face to face visit EVERY time it needs a refill.

## 2022-11-05 NOTE — Progress Notes (Signed)
MyChart Video visit  Subjective: WJ:[id number] PCP: [name], DO NWG:NFAOZ R Stull is a 89 y.o. male. Patient provides verbal consent for consult held via video.  Due to COVID-19 pandemic this visit was conducted virtually. This visit type was conducted due to national recommendations for restrictions regarding the COVID-19 Pandemic (e.g. social distancing, sheltering in place) in an effort to limit this patient's exposure and mitigate transmission in our community. All issues noted in this document were discussed and addressed.  A physical exam was not performed with this format.   Location of patient: home Location of provider: [facility] Others present for call: daughter  1.  Lumbar radiculopathy Patient had MRI of the spine which showed multilevel lumbar spondylosis predominantly at the lumbar level 3 and 4.  He had severe right neural foraminal narrowing at L5 on S1 and moderate left neural foraminal narrowing at L1 on L2.  Both of these have been progressed from previous evaluation.  He has been urgently referred to neurosurgery.  He was seen by my partner last week who recommended that he reduce his gabapentin and Percocet due to excessive sedation.  He was apparently also prescribed Valium.  He presents via virtual visit today with his daughter who notes that his pain has been pretty uncontrolled with this reduction.  He would like to resume 1 tablet per dosing.  She reports that she titrated his gabapentin up to 200 mg 3 times daily.  He has not seen a great deal of difference with these changes so far and continues to have quite a bit of lower extremity pain.  ROS: Per HPI  Allergies  Allergen Reactions   Demerol [Meperidine] Other (See Comments)    Pt. States "he woke up during a colonoscopy"   Spiriva Handihaler [Tiotropium Bromide Monohydrate] Other (See Comments)    Mild Urinary Retention   Past Medical History:  Diagnosis Date   Acute medial meniscal tear 07/20/2014   Atrial  fibrillation with RVR (HCC) 05/10/2021   Chronic headache 08/05/2013   COPD (chronic obstructive pulmonary disease) (HCC)    CVA (cerebral vascular accident) (HCC) 05/01/2021   Enlarged prostate    GERD (gastroesophageal reflux disease)    Glaucoma    Glucagonoma    Hernia, incisional    at present   [facility] (hard of hearing)    IBS (irritable bowel syndrome)    Incisional hernia, without obstruction or gangrene 08/05/2013   Sciatic pain    Stage 3b chronic kidney disease (CKD) (HCC)     Current Outpatient Medications:    alum & mag hydroxide-simeth (MAALOX PLUS) 400-400-40 MG/5ML suspension, Take 15 mLs by mouth every 6 (six) hours as needed (gas)., Disp: 355 mL, Rfl: 0   apixaban (ELIQUIS) 2.5 MG TABS tablet, Take 1 tablet (2.5 mg total) by mouth 2 (two) times daily., Disp: 180 tablet, Rfl: 3   aspirin EC 81 MG tablet, Take 1 tablet (81 mg total) by mouth daily. Swallow whole., Disp: 30 tablet, Rfl: 11   atorvastatin (LIPITOR) 40 MG tablet, Take 1 tablet (40 mg total) by mouth daily., Disp: 90 tablet, Rfl: 3   Azelastine HCl 137 MCG/SPRAY SOLN, PLACE 1 SPRAY IN EACH NOSTRIL ONCE A DAY AS DIRECTED, Disp: 30 mL, Rfl: 5   Cholecalciferol (VITAMIN D3) 2000 UNITS TABS, Take 1 tablet by mouth daily., Disp: , Rfl:    CRANBERRY PO, Take by mouth., Disp: , Rfl:    Cyanocobalamin (VITAMIN B-12) 5000 MCG SUBL, Place 1 tablet under the tongue  daily., Disp: , Rfl:    diclofenac Sodium (VOLTAREN) 1 % GEL, Apply 2 g topically 4 (four) times daily as needed (arthritis)., Disp: 200 g, Rfl: PRN   docusate sodium (COLACE) 100 MG capsule, Take 1 capsule (100 mg total) by mouth every 12 (twelve) hours., Disp: 60 capsule, Rfl: 0   finasteride (PROSCAR) 5 MG tablet, Take 5 mg by mouth daily., Disp: , Rfl:    fluticasone (FLONASE) 50 MCG/ACT nasal spray, Place 2 sprays into both nostrils daily., Disp: 16 g, Rfl: 11   Fluticasone-Umeclidin-Vilant (TRELEGY ELLIPTA) 100-62.5-25 MCG/ACT AEPB, Inhale 1 puff into the  lungs every evening., Disp: 60 each, Rfl: 5   gabapentin (NEURONTIN) 100 MG capsule, Take 1 capsule (100 mg total) by mouth 3 (three) times daily., Disp: 60 capsule, Rfl: 0   guaiFENesin (MUCINEX) 600 MG 12 hr tablet, Take 2 tablets (1,200 mg total) by mouth 2 (two) times daily as needed for to loosen phlegm or cough., Disp: , Rfl:    latanoprost (XALATAN) 0.005 % ophthalmic solution, Place 1 drop into the left eye at bedtime. (Patient taking differently: Place 1 drop into both eyes at bedtime.), Disp: 2.5 mL, Rfl: 1   methylPREDNISolone (MEDROL DOSEPAK) 4 MG TBPK tablet, Take according to package instructions, Disp: 21 each, Rfl: 0   oxyCODONE-acetaminophen (PERCOCET/ROXICET) 5-325 MG tablet, Take 1 tablet by mouth every 6 (six) hours as needed for severe pain., Disp: 15 tablet, Rfl: 0   pantoprazole (PROTONIX) 40 MG tablet, TAKE 1 TABLET 2 TIMES A DAY BEFORE A MEAL, Disp: 180 tablet, Rfl: 1   polyethylene glycol (MIRALAX / GLYCOLAX) 17 g packet, Take 17 g by mouth daily., Disp: 14 each, Rfl: 0   silodosin (RAPAFLO) 8 MG CAPS capsule, Take 8 mg by mouth daily., Disp: , Rfl:    sodium phosphate (FLEET) 7-19 GM/118ML ENEM, Place 133 mLs (1 enema total) rectally daily as needed for severe constipation., Disp: 133 mL, Rfl: 10   timolol (TIMOPTIC) 0.5 % ophthalmic solution, Place 1 drop into the left eye every morning., Disp: , Rfl:    VENTOLIN HFA 108 (90 Base) MCG/ACT inhaler, Inhale 2 puffs into the lungs every 6 (six) hours as needed for wheezing or shortness of breath., Disp: 6.7 g, Rfl: 5  Gen: nontoxic elderly male, seated Pulm: normal WOB on room air Neuro: responds to questions, hard of hearing.  Assessment/ Plan: 89 y.o. male   Lumbar radiculopathy - Plan: oxyCODONE-acetaminophen (PERCOCET/ROXICET) 5-325 MG tablet, gabapentin (NEURONTIN) 300 MG capsule  I have increased his gabapentin to 300 mg 3 times daily and discussed that this is the max dose based on his renal function.  I  encouraged her to please not self titrate any medications without the supervision of a physician as this can be dangerous in this geriatric patient with impaired renal function.  I will give him Percocet to use as needed until he is able to be seen by neurosurgery.  I do not think he is a good surgical candidate but he may be a candidate for injection therapy.  May need to consider referral to pain specialist pending the neurosurgeons evaluation.  I gave the daughter the neurosurgery office information including telephone number today to contact if they are not called within the next day or so for an appointment.  I again reiterated the high risk medication use between the gabapentin and Percocet given his advanced age and increased risk of falls given acute on chronic back pain.  I have recommended 24/7  monitoring of this patient, which his daughter assures me can be provided.  The Narcotic Database has been reviewed.  There were no red flags.     Start time: 9:49am End time: 9:59a  Total time spent on patient care (including video visit/ documentation): 15 minutes  Cartina Brousseau Hulen Skains, DO [facility] [phone]

## 2022-11-05 NOTE — Telephone Encounter (Signed)
Pt was seen today.

## 2022-11-05 NOTE — Telephone Encounter (Signed)
Pts daughter called stating that he has been to the ER 3 times for GI and pinched nerves. Says he is out of his medicine and needs refill if possible and also needs PCP to go ahead and refer pt to see pain management and neurologist if possible.

## 2022-11-06 ENCOUNTER — Encounter: Payer: Self-pay | Admitting: Family Medicine

## 2022-11-11 ENCOUNTER — Emergency Department (HOSPITAL_COMMUNITY)
Admission: EM | Admit: 2022-11-11 | Discharge: 2022-11-11 | Disposition: A | Discharge status: Home / Self Care | Payer: Medicare Other | Attending: Emergency Medicine | Admitting: Emergency Medicine

## 2022-11-11 ENCOUNTER — Emergency Department (HOSPITAL_COMMUNITY): Payer: Medicare Other

## 2022-11-11 ENCOUNTER — Encounter (HOSPITAL_COMMUNITY): Payer: Self-pay | Admitting: Emergency Medicine

## 2022-11-11 ENCOUNTER — Other Ambulatory Visit: Payer: Self-pay

## 2022-11-11 DIAGNOSIS — R0902 Hypoxemia: Secondary | ICD-10-CM | POA: Diagnosis not present

## 2022-11-11 DIAGNOSIS — R6883 Chills (without fever): Secondary | ICD-10-CM

## 2022-11-11 DIAGNOSIS — R000 Tachycardia, unspecified: Secondary | ICD-10-CM | POA: Diagnosis not present

## 2022-11-11 DIAGNOSIS — G4489 Other headache syndrome: Secondary | ICD-10-CM | POA: Diagnosis not present

## 2022-11-11 DIAGNOSIS — R1084 Generalized abdominal pain: Secondary | ICD-10-CM | POA: Diagnosis not present

## 2022-11-11 DIAGNOSIS — R531 Weakness: Secondary | ICD-10-CM | POA: Diagnosis not present

## 2022-11-11 DIAGNOSIS — I4891 Unspecified atrial fibrillation: Secondary | ICD-10-CM | POA: Diagnosis not present

## 2022-11-11 LAB — CBC WITH DIFFERENTIAL/PLATELET
Abs Immature Granulocytes: 0.08 10*3/uL — ABNORMAL HIGH (ref 0.00–0.07)
Basophils Absolute: 0 10*3/uL (ref 0.0–0.1)
Basophils Relative: 0 %
Eosinophils Absolute: 0.1 10*3/uL (ref 0.0–0.5)
Eosinophils Relative: 1 %
HCT: 37.4 % — ABNORMAL LOW (ref 39.0–52.0)
Hemoglobin: 12.1 g/dL — ABNORMAL LOW (ref 13.0–17.0)
Immature Granulocytes: 1 %
Lymphocytes Relative: 4 %
Lymphs Abs: 0.7 10*3/uL (ref 0.7–4.0)
MCH: 31.5 pg (ref 26.0–34.0)
MCHC: 32.4 g/dL (ref 30.0–36.0)
MCV: 97.4 fL (ref 80.0–100.0)
Monocytes Absolute: 0.8 10*3/uL (ref 0.1–1.0)
Monocytes Relative: 5 %
Neutro Abs: 14.7 10*3/uL — ABNORMAL HIGH (ref 1.7–7.7)
Neutrophils Relative %: 89 %
Platelets: 244 10*3/uL (ref 150–400)
RBC: 3.84 MIL/uL — ABNORMAL LOW (ref 4.22–5.81)
RDW: 13.6 % (ref 11.5–15.5)
WBC: 16.3 10*3/uL — ABNORMAL HIGH (ref 4.0–10.5)
nRBC: 0 % (ref 0.0–0.2)

## 2022-11-11 LAB — URINALYSIS, ROUTINE W REFLEX MICROSCOPIC
Bilirubin Urine: NEGATIVE
Glucose, UA: NEGATIVE mg/dL
Hgb urine dipstick: NEGATIVE
Ketones, ur: NEGATIVE mg/dL
Leukocytes,Ua: NEGATIVE
Nitrite: NEGATIVE
Protein, ur: NEGATIVE mg/dL
Specific Gravity, Urine: 1.011 (ref 1.005–1.030)
pH: 5 (ref 5.0–8.0)

## 2022-11-11 LAB — COMPREHENSIVE METABOLIC PANEL
ALT: 18 U/L (ref 0–44)
AST: 20 U/L (ref 15–41)
Albumin: 3.1 g/dL — ABNORMAL LOW (ref 3.5–5.0)
Alkaline Phosphatase: 69 U/L (ref 38–126)
Anion gap: 9 (ref 5–15)
BUN: 24 mg/dL — ABNORMAL HIGH (ref 8–23)
CO2: 24 mmol/L (ref 22–32)
Calcium: 8.8 mg/dL — ABNORMAL LOW (ref 8.9–10.3)
Chloride: 105 mmol/L (ref 98–111)
Creatinine, Ser: 1.5 mg/dL — ABNORMAL HIGH (ref 0.61–1.24)
GFR, Estimated: 43 mL/min — ABNORMAL LOW (ref >60)
Glucose, Bld: 119 mg/dL — ABNORMAL HIGH (ref 70–99)
Potassium: 4.6 mmol/L (ref 3.5–5.1)
Sodium: 138 mmol/L (ref 135–145)
Total Bilirubin: 0.8 mg/dL (ref 0.3–1.2)
Total Protein: 5.9 g/dL — ABNORMAL LOW (ref 6.5–8.1)

## 2022-11-11 LAB — LACTIC ACID, PLASMA
Lactic Acid, Venous: 1.8 mmol/L (ref 0.5–1.9)
Lactic Acid, Venous: 1.1 mmol/L (ref 0.5–1.9)

## 2022-11-11 LAB — MAGNESIUM: Magnesium: 1.6 mg/dL — ABNORMAL LOW (ref 1.7–2.4)

## 2022-11-11 LAB — CULTURE, BLOOD (ROUTINE X 2)

## 2022-11-11 MED ORDER — LEVOFLOXACIN 750 MG PO TABS: 750.0000 mg | ORAL_TABLET | Freq: Every day | ORAL | 0 refills | Status: DC

## 2022-11-11 MED ORDER — GABAPENTIN 300 MG PO CAPS
300.0000 mg | ORAL_CAPSULE | Freq: Once | ORAL | Status: AC
  Administered 2022-11-11: 300 mg via ORAL
  Filled 2022-11-11: qty 1

## 2022-11-11 MED ORDER — OXYCODONE-ACETAMINOPHEN 5-325 MG PO TABS
1.0000 | ORAL_TABLET | Freq: Once | ORAL | Status: AC
  Administered 2022-11-11: 1 via ORAL
  Filled 2022-11-11: qty 1

## 2022-11-11 MED ORDER — LEVOFLOXACIN 500 MG PO TABS
500.0000 mg | ORAL_TABLET | Freq: Once | ORAL | Status: AC
  Administered 2022-11-11: 500 mg via ORAL
  Filled 2022-11-11: qty 1

## 2022-11-11 NOTE — ED Triage Notes (Signed)
Pt c/o abd pain and leg pain. Pt has been to ER 5 times in 4 days per pt. Family called ems for pt due to pt having tremors. Pt states he took oxycodone and gabapentin this am.

## 2022-11-11 NOTE — ED Provider Notes (Signed)
[place] EMERGENCY DEPARTMENT AT [facility] Provider Note   CSN: [id number] Arrival date & time: 11/11/22  1513     History {Add pertinent medical, surgical, social history, OB history to HPI:1} Chief Complaint  Patient presents with   Pain    Charles Golden is a 89 y.o. male.  Patient had 1 episode of chills today.  Hide he is feeling fine now.   Weakness      Home Medications Prior to Admission medications   Medication Sig Start Date End Date Taking? Authorizing Provider  levofloxacin (LEVAQUIN) 750 MG tablet Take 1 tablet (750 mg total) by mouth daily. X 7 days 11/11/22  Yes Bethann Berkshire, MD  alum & mag hydroxide-simeth (MAALOX PLUS) 400-400-40 MG/5ML suspension Take 15 mLs by mouth every 6 (six) hours as needed (gas). 10/22/22   Mesner, Barbara Cower, MD  apixaban (ELIQUIS) 2.5 MG TABS tablet Take 1 tablet (2.5 mg total) by mouth 2 (two) times daily. 01/24/22   [name], DO  aspirin EC 81 MG tablet Take 1 tablet (81 mg total) by mouth daily. Swallow whole. 08/03/21   Micki Riley, MD  atorvastatin (LIPITOR) 40 MG tablet Take 1 tablet (40 mg total) by mouth daily. 01/24/22   Delynn Flavin M, DO  Azelastine HCl 137 MCG/SPRAY SOLN PLACE 1 SPRAY IN EACH NOSTRIL ONCE A DAY AS DIRECTED 01/03/22   Delynn Flavin M, DO  Cholecalciferol (VITAMIN D3) 2000 UNITS TABS Take 1 tablet by mouth daily.    [provider]  CRANBERRY PO Take by mouth.    [provider]  Cyanocobalamin (VITAMIN B-12) 5000 MCG SUBL Place 1 tablet under the tongue daily.    [provider]  diclofenac Sodium (VOLTAREN) 1 % GEL Apply 2 g topically 4 (four) times daily as needed (arthritis). 08/08/22   [name], DO  docusate sodium (COLACE) 100 MG capsule Take 1 capsule (100 mg total) by mouth every 12 (twelve) hours. 10/22/22   Mesner, Barbara Cower, MD  finasteride (PROSCAR) 5 MG tablet Take 5 mg by mouth daily.    [provider]  fluticasone (FLONASE)  50 MCG/ACT nasal spray Place 2 sprays into both nostrils daily. 01/03/22   [name], DO  Fluticasone-Umeclidin-Vilant (TRELEGY ELLIPTA) 100-62.5-25 MCG/ACT AEPB Inhale 1 puff into the lungs every evening. 09/17/22   Coralyn Helling, MD  gabapentin (NEURONTIN) 300 MG capsule Take 1 capsule (300 mg total) by mouth 3 (three) times daily. 11/05/22   [name], DO  guaiFENesin (MUCINEX) 600 MG 12 hr tablet Take 2 tablets (1,200 mg total) by mouth 2 (two) times daily as needed for to loosen phlegm or cough. 09/29/21   Coralyn Helling, MD  latanoprost (XALATAN) 0.005 % ophthalmic solution Place 1 drop into the left eye at bedtime. Patient taking differently: Place 1 drop into both eyes at bedtime. 06/01/16   Ernestina Penna, MD  methylPREDNISolone (MEDROL DOSEPAK) 4 MG TBPK tablet Take according to package instructions 10/26/22   Gwyneth Sprout, MD  oxyCODONE-acetaminophen (PERCOCET/ROXICET) 5-325 MG tablet Take 1 tablet by mouth every 8 (eight) hours as needed for severe pain. 11/05/22   Delynn Flavin M, DO  pantoprazole (PROTONIX) 40 MG tablet TAKE 1 TABLET 2 TIMES A DAY BEFORE A MEAL 06/08/22   Gottschalk, Kathie Rhodes M, DO  polyethylene glycol (MIRALAX / GLYCOLAX) 17 g packet Take 17 g by mouth daily. 10/22/22   Mesner, Barbara Cower, MD  silodosin (RAPAFLO) 8 MG CAPS capsule Take 8 mg by  mouth daily. 06/02/21   [provider]  timolol (TIMOPTIC) 0.5 % ophthalmic solution Place 1 drop into the left eye every morning. 11/24/18   [provider]  VENTOLIN HFA 108 (90 Base) MCG/ACT inhaler Inhale 2 puffs into the lungs every 6 (six) hours as needed for wheezing or shortness of breath. 09/29/21   Coralyn Helling, MD      Allergies    Demerol [meperidine] and Spiriva handihaler [tiotropium bromide monohydrate]    Review of Systems   Review of Systems  Neurological:  Positive for weakness.    Physical Exam Updated Vital Signs BP (!) 120/102   Pulse 99   Temp 99.9 F (37.7 C) (Oral)   Resp  19   Ht 5\' 10"  (1.778 m)   Wt 72 kg   SpO2 96%   BMI 22.78 kg/m  Physical Exam  ED Results / Procedures / Treatments   Labs (all labs ordered are listed, but only abnormal results are displayed) Labs Reviewed  CBC WITH DIFFERENTIAL/PLATELET - Abnormal; Notable for the following components:      Result Value   WBC 16.3 (*)    RBC 3.84 (*)    Hemoglobin 12.1 (*)    HCT 37.4 (*)    Neutro Abs 14.7 (*)    Abs Immature Granulocytes 0.08 (*)    All other components within normal limits  COMPREHENSIVE METABOLIC PANEL - Abnormal; Notable for the following components:   Glucose, Bld 119 (*)    BUN 24 (*)    Creatinine, Ser 1.50 (*)    Calcium 8.8 (*)    Total Protein 5.9 (*)    Albumin 3.1 (*)    GFR, Estimated 43 (*)    All other components within normal limits  MAGNESIUM - Abnormal; Notable for the following components:   Magnesium 1.6 (*)    All other components within normal limits  CULTURE, BLOOD (ROUTINE X 2)  CULTURE, BLOOD (ROUTINE X 2)  URINALYSIS, ROUTINE W REFLEX MICROSCOPIC  LACTIC ACID, PLASMA  LACTIC ACID, PLASMA    EKG None  Radiology DG Chest Port 1 View  Result Date: 11/11/2022 CLINICAL DATA:  Weakness EXAM: PORTABLE CHEST 1 VIEW COMPARISON:  07/02/2022 FINDINGS: The heart size and mediastinal contours are within normal limits. Aortic atherosclerosis. Coarsened interstitial markings bilaterally. No focal consolidation. No pleural effusion or pneumothorax. The visualized skeletal structures are unremarkable. IMPRESSION: Coarsened interstitial markings bilaterally, which may reflect bronchitic type lung changes. No focal consolidation. Electronically Signed   By: Duanne Guess D.O.   On: 11/11/2022 16:25    Procedures Procedures  {Document cardiac monitor, telemetry assessment procedure when appropriate:1}  Medications Ordered in ED Medications  oxyCODONE-acetaminophen (PERCOCET/ROXICET) 5-325 MG per tablet 1 tablet (has no administration in time range)   levofloxacin (LEVAQUIN) tablet 500 mg (has no administration in time range)  gabapentin (NEURONTIN) capsule 300 mg (300 mg Oral Given 11/11/22 1843)    ED Course/ Medical Decision Making/ A&P   {Patient with episode of chills.  Lactic normal.  White count slightly elevated but has been taking prednisone recently. Click here for ABCD2, HEART and other calculatorsREFRESH Note before signing :1}                          Medical Decision Making Amount and/or Complexity of Data Reviewed Labs: ordered. Radiology: ordered.  Risk Prescription drug management.   Patient with 1 episode of chills but no other signs of sepsis.  He is  getting 2 blood cultures and put on Levaquin and will follow-up with his PCP in 2 days  {Document critical care time when appropriate:1} {Document review of labs and clinical decision tools ie heart score, Chads2Vasc2 etc:1}  {Document your independent review of radiology images, and any outside records:1} {Document your discussion with family members, caretakers, and with consultants:1} {Document social determinants of health affecting pt's care:1} {Document your decision making why or why not admission, treatments were needed:1} Final Clinical Impression(s) / ED Diagnoses Final diagnoses:  Chills    Rx / DC Orders ED Discharge Orders          Ordered    levofloxacin (LEVAQUIN) 750 MG tablet  Daily        11/11/22 1916

## 2022-11-11 NOTE — Discharge Instructions (Signed)
Follow-up with your doctor in 2 days for recheck °

## 2022-11-12 LAB — CULTURE, BLOOD (ROUTINE X 2)
Culture: NO GROWTH
Special Requests: ADEQUATE

## 2022-11-13 ENCOUNTER — Ambulatory Visit (INDEPENDENT_AMBULATORY_CARE_PROVIDER_SITE_OTHER): Payer: Medicare Other | Admitting: Nurse Practitioner

## 2022-11-13 ENCOUNTER — Other Ambulatory Visit (HOSPITAL_BASED_OUTPATIENT_CLINIC_OR_DEPARTMENT_OTHER): Payer: Self-pay

## 2022-11-13 ENCOUNTER — Encounter: Payer: Self-pay | Admitting: Nurse Practitioner

## 2022-11-13 VITALS — BP 127/73 | HR 75 | Temp 97.5°F | Resp 20 | Ht 70.0 in | Wt 156.0 lb

## 2022-11-13 DIAGNOSIS — Z7689 Persons encountering health services in other specified circumstances: Secondary | ICD-10-CM

## 2022-11-13 DIAGNOSIS — M5416 Radiculopathy, lumbar region: Secondary | ICD-10-CM | POA: Diagnosis not present

## 2022-11-13 MED FILL — Fluticasone-Umeclidinium-Vilanterol AEPB 100-62.5-25 MCG/ACT: RESPIRATORY_TRACT | 30 days supply | Qty: 60 | Fill #0 | Status: AC

## 2022-11-13 NOTE — Patient Instructions (Signed)

## 2022-11-13 NOTE — Progress Notes (Signed)
Subjective:    Patient ID: Charles Golden, male    DOB: 22-Jan-1926, 89 y.o.   MRN: [id number]  Today's visit was for Transitional Care Management.  The patient was discharged from [facility] on 10/28/22 with a primary diagnosis of lumbar radiculopathy.   Contact with the patient and/or caregiver, by a clinical staff member, was made on 11/01/22 and was documented as a telephone encounter within the EMR.  Through chart review and discussion with the patient I have determined that management of their condition is of moderate complexity.    He has been to the ED several times with back pain. He has been dx with pinched nerve. Is on pain meds and gabapentin. On fathers day he developed chills and fever. He was taken back to the ER. They did blood work which was all normal. They did blood culture just prior to discharge. He is on antibiotic. His fever has resolved and is feeling better. He is still have back pain and is scheduled for back injection on Thursday morning and they wanted to make sure it is ok for him to go ahead and have that.     Review of Systems  Constitutional:  Negative for diaphoresis.  Eyes:  Negative for pain.  Respiratory:  Negative for shortness of breath.   Cardiovascular:  Negative for chest pain, palpitations and leg swelling.  Gastrointestinal:  Negative for abdominal pain.  Endocrine: Negative for polydipsia.  Skin:  Negative for rash.  Neurological:  Negative for dizziness, weakness and headaches.  Hematological:  Does not bruise/bleed easily.  All other systems reviewed and are negative.      Objective:   Physical Exam Vitals and nursing note reviewed.  Constitutional:      Appearance: Normal appearance. He is well-developed.  Neck:     Thyroid: No thyroid mass or thyromegaly.     Vascular: No carotid bruit or JVD.     Trachea: Phonation normal.  Cardiovascular:     Rate and Rhythm: Normal rate and regular rhythm.  Pulmonary:     Effort:  Pulmonary effort is normal. No respiratory distress.     Breath sounds: Normal breath sounds.  Abdominal:     General: Bowel sounds are normal.     Palpations: Abdomen is soft.     Tenderness: There is no abdominal tenderness.  Musculoskeletal:        General: Normal range of motion.     Cervical back: Normal range of motion and neck supple.     Comments: ROM of lumbar spine with pain on flexion and extension (-) SLR bil  Lymphadenopathy:     Cervical: No cervical adenopathy.  Skin:    General: Skin is warm and dry.  Neurological:     Mental Status: He is alert and oriented to person, place, and time.  Psychiatric:        Behavior: Behavior normal.        Thought Content: Thought content normal.        Judgment: Judgment normal.    BP 127/73   Pulse 75   Temp (!) 97.5 F (36.4 C) (Temporal)   Resp 20   Ht 5\' 10"  (1.778 m)   Wt 156 lb (70.8 kg)   SpO2 99%   BMI 22.38 kg/m         Assessment & Plan:   [name] in today with chief complaint of Hospitalization Follow-up   1. Encounter for support and coordination of transition of  care Hospital records reviewed  2. Radiculopathy of lumbar region Keep appointment Thursday for injections with neuro surgeon. Continue to use walker  Fall prevention    The above assessment and management plan was discussed with the patient. The patient verbalized understanding of and has agreed to the management plan. Patient is aware to call the clinic if symptoms persist or worsen. Patient is aware when to return to the clinic for a follow-up visit. Patient educated on when it is appropriate to go to the emergency department.   Mary-Margaret Daphine Deutscher, FNP

## 2022-11-15 ENCOUNTER — Telehealth: Payer: Self-pay

## 2022-11-15 DIAGNOSIS — M48062 Spinal stenosis, lumbar region with neurogenic claudication: Secondary | ICD-10-CM | POA: Diagnosis not present

## 2022-11-15 LAB — CULTURE, BLOOD (ROUTINE X 2)

## 2022-11-15 NOTE — Telephone Encounter (Signed)
Transition Care Management Follow-up Telephone Call Date of discharge and from where: [facility] 6/16 How have you been since you were released from the hospital? Still in pain  Any questions or concerns? No  Items Reviewed: Did the pt receive and understand the discharge instructions provided? Yes  Medications obtained and verified? Yes  Other? No  Any new allergies since your discharge? No  Dietary orders reviewed? No Do you have support at home? Yes     Follow up appointments reviewed:  PCP Hospital f/u appt confirmed? No  Scheduled to see  on @ . Specialist Hospital f/u appt confirmed? Yes  Scheduled to see  on 6/20 @ . Are transportation arrangements needed? No  If their condition worsens, is the pt aware to call PCP or go to the Emergency Dept.? Yes Was the patient provided with contact information for the PCP's office or ED? Yes Was to pt encouraged to call back with questions or concerns? Yes

## 2022-11-15 NOTE — Addendum Note (Signed)
Addended by: Bennie Pierini on: 11/15/2022 01:22 PM   Modules accepted: Level of Service

## 2022-11-16 DIAGNOSIS — M48062 Spinal stenosis, lumbar region with neurogenic claudication: Secondary | ICD-10-CM | POA: Diagnosis not present

## 2022-11-16 LAB — CULTURE, BLOOD (ROUTINE X 2): Special Requests: ADEQUATE

## 2022-11-16 NOTE — Progress Notes (Addendum)
Referring Physician:  [name], DO [address] [place],  [place] [address]  Primary Physician:  [name], DO  History of Present Illness: 11/21/2022 Charles Golden has a history of afib, COPD, CVA, GERD, glaucoma, IBS, CKD 3b, BPH, and RLS.   Seen in ED on 10/26/22 and again on 10/28/22 for back and leg pain. Has seen PCP as well.   He saw Dr. Franky Macho at [facility] and he had lumbar ESI on 11/16/22. He did see relief with this injection.   Daughter helps with history when needed.   He complains of more constant bilateral leg pain to his knees and sometimes to his feet. No current LBP. He has some numbness in his legs. No tingling. He has weakness in both legs.   PCP has him on neurontin (max dose of 300mg  tid due to CKD) and prn percocet. He was given medrol dose pack a month ago. Medications are helping.   He is taking ELIQUIS.  Bowel/Bladder Dysfunction: none  Conservative measures:  Physical therapy: initial eval on 6/7/234 at [facility]  Multimodal medical therapy including regular antiinflammatories: neurontin, oxycodone, dose pack Injections:  11/16/22 Lumbar ESI at [facility]  Past Surgery: No spinal surgery  HYUN FRIESE has no symptoms of cervical myelopathy.  The symptoms are causing a significant impact on the patient's life.   Review of Systems:  A 10 point review of systems is negative, except for the pertinent positives and negatives detailed in the HPI.  Past Medical History: Past Medical History:  Diagnosis Date   Acute medial meniscal tear 07/20/2014   Atrial fibrillation with RVR (HCC) 05/10/2021   Chronic headache 08/05/2013   COPD (chronic obstructive pulmonary disease) (HCC)    CVA (cerebral vascular accident) (HCC) 05/01/2021   Enlarged prostate    GERD (gastroesophageal reflux disease)    Glaucoma    Glucagonoma    Hernia, incisional    at present   [facility] (hard of hearing)    IBS (irritable bowel syndrome)     Incisional hernia, without obstruction or gangrene 08/05/2013   Sciatic pain    Stage 3b chronic kidney disease (CKD) (HCC)     Past Surgical History: Past Surgical History:  Procedure Laterality Date   BOWEL RESECTION N/A 09/04/2012   Procedure: SMALL BOWEL RESECTION;  Surgeon: Fabio Bering, MD;  Location: [facility];  Service: General;  Laterality: N/A;  Anastimosis   HEMORRHOID SURGERY     HERNIA REPAIR Bilateral 70's   INGUINAL HERNIA REPAIR Left 09/14/2013   Procedure: LEFT INGUINAL HERNIORRHAPHY;  Surgeon: Dalia Heading, MD;  Location: [facility];  Service: General;  Laterality: Left;   INGUINAL HERNIA REPAIR Right 12/23/2018   Procedure: RECURRENT RIGHT INGUINAL HERNIA  REPAIR  WITH MESH;  Surgeon: Franky Macho, MD;  Location: [facility];  Service: General;  Laterality: Right;   INSERTION OF MESH Left 09/14/2013   Procedure: INSERTION OF MESH;  Surgeon: Dalia Heading, MD;  Location: [facility];  Service: General;  Laterality: Left;   IR ANGIO INTRA EXTRACRAN SEL INTERNAL CAROTID UNI L MOD SED  05/04/2021   IR INTRAVSC STENT CERV CAROTID W/EMB-PROT MOD SED INCL ANGIO  05/04/2021   IR US GUIDE VASC ACCESS RIGHT  05/04/2021   KNEE ARTHROSCOPY Left 07/21/2014   Procedure: LEFT KNEE ARTHROSCOPY WITH MEDIAL MENISCAL DEBRIDEMENT ;  Surgeon: Loanne Drilling, MD;  Location: [facility];  Service: Orthopedics;  Laterality: Left;   LAPAROTOMY N/A 09/04/2012  Procedure: EXPLORATORY LAPAROTOMY;  Surgeon: Fabio Bering, MD;  Location: [facility];  Service: General;  Laterality: N/A;   RADIOLOGY WITH ANESTHESIA Left 05/04/2021   Procedure: LEFT ICA STENT;  Surgeon: Baldemar Lenis, MD;  Location: [facility] OR;  Service: Radiology;  Laterality: Left;   SKIN LESION EXCISION     Dr Lazarus Salines   TONSILLECTOMY      Allergies: Allergies as of 11/21/2022 - Review Complete 11/21/2022  Allergen Reaction Noted   Demerol [meperidine] Other (See Comments) 02/15/2015   Spiriva handihaler [tiotropium bromide  monohydrate] Other (See Comments) 10/08/2016    Medications: Outpatient Encounter Medications as of 11/21/2022  Medication Sig   alum & mag hydroxide-simeth (MAALOX PLUS) 400-400-40 MG/5ML suspension Take 15 mLs by mouth every 6 (six) hours as needed (gas).   apixaban (ELIQUIS) 2.5 MG TABS tablet Take 1 tablet (2.5 mg total) by mouth 2 (two) times daily.   aspirin EC 81 MG tablet Take 1 tablet (81 mg total) by mouth daily. Swallow whole.   atorvastatin (LIPITOR) 40 MG tablet Take 1 tablet (40 mg total) by mouth daily.   Azelastine HCl 137 MCG/SPRAY SOLN PLACE 1 SPRAY IN EACH NOSTRIL ONCE A DAY AS DIRECTED   Cholecalciferol (VITAMIN D3) 2000 UNITS TABS Take 1 tablet by mouth daily.   CRANBERRY PO Take by mouth.   Cyanocobalamin (VITAMIN B-12) 5000 MCG SUBL Place 1 tablet under the tongue daily.   diclofenac Sodium (VOLTAREN) 1 % GEL Apply 2 g topically 4 (four) times daily as needed (arthritis).   docusate sodium (COLACE) 100 MG capsule Take 1 capsule (100 mg total) by mouth every 12 (twelve) hours.   finasteride (PROSCAR) 5 MG tablet Take 5 mg by mouth daily.   fluticasone (FLONASE) 50 MCG/ACT nasal spray Place 2 sprays into both nostrils daily.   Fluticasone-Umeclidin-Vilant (TRELEGY ELLIPTA) 100-62.5-25 MCG/ACT AEPB Inhale 1 puff into the lungs every evening.   gabapentin (NEURONTIN) 300 MG capsule Take 1 capsule (300 mg total) by mouth 3 (three) times daily.   guaiFENesin (MUCINEX) 600 MG 12 hr tablet Take 2 tablets (1,200 mg total) by mouth 2 (two) times daily as needed for to loosen phlegm or cough.   latanoprost (XALATAN) 0.005 % ophthalmic solution Place 1 drop into the left eye at bedtime. (Patient taking differently: Place 1 drop into both eyes at bedtime.)   oxyCODONE-acetaminophen (PERCOCET/ROXICET) 5-325 MG tablet Take 0.5-1 tablets by mouth every 8 (eight) hours as needed for severe pain (USE ONLY IF NEEDED).   pantoprazole (PROTONIX) 40 MG tablet TAKE 1 TABLET 2 TIMES A DAY BEFORE  A MEAL   polyethylene glycol (MIRALAX / GLYCOLAX) 17 g packet Take 17 g by mouth daily.   silodosin (RAPAFLO) 8 MG CAPS capsule Take 8 mg by mouth daily.   timolol (TIMOPTIC) 0.5 % ophthalmic solution Place 1 drop into the left eye every morning.   VENTOLIN HFA 108 (90 Base) MCG/ACT inhaler Inhale 2 puffs into the lungs every 6 (six) hours as needed for wheezing or shortness of breath.   [DISCONTINUED] methylPREDNISolone (MEDROL DOSEPAK) 4 MG TBPK tablet Take according to package instructions   [DISCONTINUED] levofloxacin (LEVAQUIN) 750 MG tablet Take 1 tablet (750 mg total) by mouth daily. X 7 days   [DISCONTINUED] methylPREDNISolone (MEDROL DOSEPAK) 4 MG TBPK tablet Take according to package instructions   [DISCONTINUED] oxyCODONE-acetaminophen (PERCOCET/ROXICET) 5-325 MG tablet Take 1 tablet by mouth every 8 (eight) hours as needed for severe pain.   No facility-administered encounter medications on  file as of 11/21/2022.    Social History: Social History   Tobacco Use   Smoking status: Former    Packs/day: 1.00    Years: 30.00    Additional pack years: 0.00    Total pack years: 30.00    Types: Cigarettes    Quit date: 05/28/2006    Years since quitting: 16.4   Smokeless tobacco: Never  Vaping Use   Vaping Use: Never used  Substance Use Topics   Alcohol use: Not Currently    Alcohol/week: 0.0 standard drinks of alcohol   Drug use: No    Family Medical History: Family History  Problem Relation Age of Onset   Heart disease Mother        Valve replacement and pacemaker   Colon cancer Father    Asthma Other        cousin   Healthy Daughter    Healthy Daughter    Healthy Daughter     Physical Examination: Vitals:   11/21/22 0948  BP: 122/67    General: Patient is well developed, well nourished, calm, collected, and in no apparent distress. Attention to examination is appropriate.  Respiratory: Patient is breathing without any difficulty.   NEUROLOGICAL:      Awake, alert, oriented to person, place, and time.  Speech is clear and fluent. Fund of knowledge is appropriate.   Cranial Nerves: Pupils equal round and reactive to light.  Facial tone is symmetric.    No posterior lumbar tenderness.   No abnormal lesions on exposed skin.   Strength: Side Biceps Triceps Deltoid Interossei Grip Wrist Ext. Wrist Flex.  R 5 5 5 5 5 5 5   L 5 5 5 5 5 5 5    Side Iliopsoas Quads Hamstring PF DF EHL  R 5 5 5 5 5 5   L 5 5 5 5 5 5    Reflexes are 1+ and symmetric at the biceps, triceps, brachioradialis, patella and achilles.   Hoffman's is absent.  Clonus is not present.   Bilateral upper and lower extremity sensation is intact to light touch.     He has slow gait.   Medical Decision Making  Imaging: Lumbar MRI dated 10/26/22:  FINDINGS: Segmentation: Conventional numbering is assumed with 5 non-rib-bearing, lumbar type vertebral bodies. Fully formed S1-2 disc.   Alignment: Mild grade 1 anterolisthesis of L5 on S1. Trace retrolisthesis of L1 on L2.   Vertebrae: Normal vertebral body heights. Modic type 1 degenerative endplate marrow signal changes at L2-3 and L5-S1.   Conus medullaris and cauda equina: Conus extends to the L1 level. Conus and cauda equina appear normal.   Paraspinal and other soft tissues: Mild fatty atrophy of the paraspinal muscles.   Disc levels:   T12-L1:  Normal.   L1-L2: Left eccentric disc bulge and left-sided facet arthropathy results in mild narrowing of the left lateral recess and moderate left neural foraminal narrowing, worse from prior.   L2-L3: Left eccentric disc bulge and facet arthropathy results in mild left neural foraminal narrowing. No significant spinal canal stenosis.   L3-L4: Increased disc bulge and worsening bilateral facet arthropathy with periarticular edema results in compression of the traversing bilateral L4 nerve roots in the lateral recesses and mild-to-moderate spinal canal  stenosis, worse from prior.   L4-L5: Right eccentric disc bulge and right-greater-than-left facet arthropathy with periarticular edema results in mild narrowing of the right lateral recess, worse from prior. Mild bilateral neural foraminal narrowing.   L5-S1: Anterolisthesis with uncovered disc and  right-greater-than-left facet arthropathy results in severe right neural foraminal narrowing, worse from prior. No spinal canal stenosis.   IMPRESSION: 1. Multilevel lumbar spondylosis, most notable at L3-4 where there is mild-to-moderate spinal canal stenosis and compression of the traversing bilateral L4 nerve roots in the lateral recesses, worse from prior. 2. Severe right neural foraminal narrowing at L5-S1, worse from prior. 3. Moderate left neural foraminal narrowing at L1-2, worse from prior.     Electronically Signed   By: Orvan Falconer M.D.   On: 10/26/2022 08:33  I have personally reviewed the images and agree with the above interpretation.  Assessment and Plan: Mr. Duggan is a pleasant 89 y.o. male has complains of more constant bilateral leg pain to his knees and sometimes to his feet. No current LBP. He has some numbness in his legs. No tingling. He has weakness in both legs.    He has diffuse lumbar spondylosis with multilevel foraminal stenosis. Moderate central stenosis at L3-L4 and slip at L5-S1 with severe right foraminal stenosis. Leg symptoms may be from spinal stenosis.   Treatment options discussed with patient and following plan made:   - He has seen improvement with ESI, but is still having to take neurontin and prn percocet.  - He is ambulating better. I recommend he use walker or cane as needed.  - Discussed with he and his daughter that he should keep his care with [place] in [place] or with [facility] in [place].   - If he stays with them, recommend he call for follow up to discuss possible repeat ESI.  - If he changes to care with [place], will  refer to PMR at [facility] to discuss further injections.  - Would hold on PT for now.  - Family will discuss options and let me know. He will f/u prn for now.   I spent a total of 35 minutes in face-to-face and non-face-to-face activities related to this patient's care today including review of outside records, review of imaging, review of symptoms, physical exam, discussion of differential diagnosis, discussion of treatment options, and documentation.   Thank you for involving me in the care of this patient.   ADDENDUM 11/23/22:  He had L3-L4 ESI on 11/16/22. Records from injection scanned into chart.   Drake Leach PA-C Dept. of Neurosurgery

## 2022-11-18 ENCOUNTER — Encounter (INDEPENDENT_AMBULATORY_CARE_PROVIDER_SITE_OTHER): Payer: Medicare Other | Admitting: Family Medicine

## 2022-11-18 DIAGNOSIS — M5416 Radiculopathy, lumbar region: Secondary | ICD-10-CM | POA: Diagnosis not present

## 2022-11-18 DIAGNOSIS — M47819 Spondylosis without myelopathy or radiculopathy, site unspecified: Secondary | ICD-10-CM

## 2022-11-19 ENCOUNTER — Encounter: Payer: Self-pay | Admitting: Family Medicine

## 2022-11-19 ENCOUNTER — Telehealth: Payer: Self-pay | Admitting: Family Medicine

## 2022-11-19 MED ORDER — METHYLPREDNISOLONE 4 MG PO TBPK: ORAL_TABLET | ORAL | 0 refills | Status: DC

## 2022-11-19 NOTE — Telephone Encounter (Signed)
Patient coming in to see Dr. Reece Agar

## 2022-11-19 NOTE — Telephone Encounter (Signed)

## 2022-11-20 ENCOUNTER — Ambulatory Visit (INDEPENDENT_AMBULATORY_CARE_PROVIDER_SITE_OTHER): Payer: Medicare Other | Admitting: Family Medicine

## 2022-11-20 ENCOUNTER — Encounter: Payer: Self-pay | Admitting: Family Medicine

## 2022-11-20 DIAGNOSIS — M5416 Radiculopathy, lumbar region: Secondary | ICD-10-CM | POA: Diagnosis not present

## 2022-11-20 MED ORDER — OXYCODONE-ACETAMINOPHEN 5-325 MG PO TABS
0.5000 | ORAL_TABLET | Freq: Three times a day (TID) | ORAL | 0 refills | Status: DC | PRN
Start: 2022-11-20 — End: 2023-01-16

## 2022-11-20 NOTE — Progress Notes (Signed)
Subjective: CC:DDD lumbar spine PCP: [name], DO [id number] R Berthold is a 89 y.o. male presenting to clinic today for:  1. DDD, lumbar Patient presents today to follow up on lower extremity/ low back pain.  He has been seen by Dr Sueanne Margarita office and had a back injection Friday, he reports that this did help significantly with pain but only lasted one day.  He has not reached back out for further recommendations yet.  He instead has an appt with Dr Elaina Pattee office for second opinion in [place].  He has been taking the Gabapentin 3 times daily and using the percocet as needed but tries to separate it from the gabapentin so he is not so sleepy.  He is accompanied today by his daughter who does note that the gabapentin does seem to make him sleepy.  He has not had any issues with bowel movements as he typically takes Colace regularly and had been on MiraLAX for a while.  He is not entirely sure what his next steps are as he did not feel like he got a very good outline of what appropriate expectations and next steps would be from [place] office.  No falls. Has graduated from the walker he was using.   ROS: Per HPI  Allergies  Allergen Reactions   Demerol [Meperidine] Other (See Comments)    Pt. States "he woke up during a colonoscopy"   Spiriva Handihaler [Tiotropium Bromide Monohydrate] Other (See Comments)    Mild Urinary Retention   Past Medical History:  Diagnosis Date   Acute medial meniscal tear 07/20/2014   Atrial fibrillation with RVR (HCC) 05/10/2021   Chronic headache 08/05/2013   COPD (chronic obstructive pulmonary disease) (HCC)    CVA (cerebral vascular accident) (HCC) 05/01/2021   Enlarged prostate    GERD (gastroesophageal reflux disease)    Glaucoma    Glucagonoma    Hernia, incisional    at present   [facility] (hard of hearing)    IBS (irritable bowel syndrome)    Incisional hernia, without obstruction or gangrene 08/05/2013   Sciatic pain    Stage 3b  chronic kidney disease (CKD) (HCC)     Current Outpatient Medications:    alum & mag hydroxide-simeth (MAALOX PLUS) 400-400-40 MG/5ML suspension, Take 15 mLs by mouth every 6 (six) hours as needed (gas)., Disp: 355 mL, Rfl: 0   apixaban (ELIQUIS) 2.5 MG TABS tablet, Take 1 tablet (2.5 mg total) by mouth 2 (two) times daily., Disp: 180 tablet, Rfl: 3   aspirin EC 81 MG tablet, Take 1 tablet (81 mg total) by mouth daily. Swallow whole., Disp: 30 tablet, Rfl: 11   atorvastatin (LIPITOR) 40 MG tablet, Take 1 tablet (40 mg total) by mouth daily., Disp: 90 tablet, Rfl: 3   Azelastine HCl 137 MCG/SPRAY SOLN, PLACE 1 SPRAY IN EACH NOSTRIL ONCE A DAY AS DIRECTED, Disp: 30 mL, Rfl: 5   Cholecalciferol (VITAMIN D3) 2000 UNITS TABS, Take 1 tablet by mouth daily., Disp: , Rfl:    CRANBERRY PO, Take by mouth., Disp: , Rfl:    Cyanocobalamin (VITAMIN B-12) 5000 MCG SUBL, Place 1 tablet under the tongue daily., Disp: , Rfl:    diclofenac Sodium (VOLTAREN) 1 % GEL, Apply 2 g topically 4 (four) times daily as needed (arthritis)., Disp: 200 g, Rfl: PRN   docusate sodium (COLACE) 100 MG capsule, Take 1 capsule (100 mg total) by mouth every 12 (twelve) hours., Disp: 60 capsule, Rfl: 0   finasteride (PROSCAR) 5  MG tablet, Take 5 mg by mouth daily., Disp: , Rfl:    fluticasone (FLONASE) 50 MCG/ACT nasal spray, Place 2 sprays into both nostrils daily., Disp: 16 g, Rfl: 11   Fluticasone-Umeclidin-Vilant (TRELEGY ELLIPTA) 100-62.5-25 MCG/ACT AEPB, Inhale 1 puff into the lungs every evening., Disp: 60 each, Rfl: 5   gabapentin (NEURONTIN) 300 MG capsule, Take 1 capsule (300 mg total) by mouth 3 (three) times daily., Disp: 90 capsule, Rfl: 1   guaiFENesin (MUCINEX) 600 MG 12 hr tablet, Take 2 tablets (1,200 mg total) by mouth 2 (two) times daily as needed for to loosen phlegm or cough., Disp: , Rfl:    latanoprost (XALATAN) 0.005 % ophthalmic solution, Place 1 drop into the left eye at bedtime. (Patient taking differently:  Place 1 drop into both eyes at bedtime.), Disp: 2.5 mL, Rfl: 1   methylPREDNISolone (MEDROL DOSEPAK) 4 MG TBPK tablet, Take according to package instructions, Disp: 21 each, Rfl: 0   oxyCODONE-acetaminophen (PERCOCET/ROXICET) 5-325 MG tablet, Take 1 tablet by mouth every 8 (eight) hours as needed for severe pain., Disp: 30 tablet, Rfl: 0   pantoprazole (PROTONIX) 40 MG tablet, TAKE 1 TABLET 2 TIMES A DAY BEFORE A MEAL, Disp: 180 tablet, Rfl: 1   polyethylene glycol (MIRALAX / GLYCOLAX) 17 g packet, Take 17 g by mouth daily., Disp: 14 each, Rfl: 0   silodosin (RAPAFLO) 8 MG CAPS capsule, Take 8 mg by mouth daily., Disp: , Rfl:    timolol (TIMOPTIC) 0.5 % ophthalmic solution, Place 1 drop into the left eye every morning., Disp: , Rfl:    VENTOLIN HFA 108 (90 Base) MCG/ACT inhaler, Inhale 2 puffs into the lungs every 6 (six) hours as needed for wheezing or shortness of breath., Disp: 6.7 g, Rfl: 5 Social History   Socioeconomic History   Marital status: Married    Spouse name: [name]   Number of children: 3   Years of education: 12+   Highest education level: Bachelor's degree (e.g., BA, AB, BS)  Occupational History   Occupation: Retired    Comment: Programmer, systems  Tobacco Use   Smoking status: Former    Packs/day: 1.00    Years: 30.00    Additional pack years: 0.00    Total pack years: 30.00    Types: Cigarettes    Quit date: 05/28/2006    Years since quitting: 16.4   Smokeless tobacco: Never  Vaping Use   Vaping Use: Never used  Substance and Sexual Activity   Alcohol use: Not Currently    Alcohol/week: 0.0 standard drinks of alcohol   Drug use: No   Sexual activity: Yes    Birth control/protection: None  Other Topics Concern   Not on file  Social History Narrative   Patient lives at home with [name].    Patient has 3 children.    Patient has his BS   Patient is retired.    Drinks about 2 cups of coffee per day.      [facility]:   He is still married. He  previously worked as a Programmer, systems. No significant dust exposure. He mostly worked with synthetic fibers. He is from [place].       Social Determinants of Health   Financial Resource Strain: Low Risk  (02/09/2020)   Overall Financial Resource Strain (CARDIA)    Difficulty of Paying Living Expenses: Not hard at all  Food Insecurity: No Food Insecurity (01/01/2022)   Hunger Vital Sign    Worried About  Running Out of Food in the Last Year: Never true    Ran Out of Food in the Last Year: Never true  Transportation Needs: No Transportation Needs (01/01/2022)   PRAPARE - Administrator, Civil Service (Medical): No    Lack of Transportation (Non-Medical): No  Physical Activity: Insufficiently Active (05/19/2021)   Exercise Vital Sign    Days of Exercise per Week: 1 day    Minutes of Exercise per Session: 10 min  Stress: Stress Concern Present (07/17/2021)   [employer] of Occupational Health - Occupational Stress Questionnaire    Feeling of Stress : To some extent  Social Connections: Socially Integrated (06/09/2019)   Social Connection and Isolation Panel [NHANES]    Frequency of Communication with Friends and Family: More than three times a week    Frequency of Social Gatherings with Friends and Family: More than three times a week    Attends Religious Services: More than 4 times per year    Active Member of [employer] or Organizations: Yes    Attends Engineer, structural: More than 4 times per year    Marital Status: Married  Catering manager Violence: Not At Risk (01/01/2022)   Humiliation, Afraid, Rape, and Kick questionnaire    Fear of Current or Ex-Partner: No    Emotionally Abused: No    Physically Abused: No    Sexually Abused: No   Family History  Problem Relation Age of Onset   Heart disease Mother        Valve replacement and pacemaker   Colon cancer Father    Asthma Other        cousin   Healthy Daughter    Healthy Daughter    Healthy Daughter      Objective: Office vital signs reviewed. BP 136/74   Pulse 73   Ht 5\' 10"  (1.778 m)   Wt 157 lb (71.2 kg)   SpO2 97%   BMI 22.53 kg/m   Physical Examination:  General: Awake, alert, nontoxic elderly male, No acute distress Cardio: regular rate and rhythm, S1S2 heard, no murmurs appreciated Pulm: clear to auscultation bilaterally, no wheezes, rhonchi or rales; normal work of breathing on room air however MSK: Slow and slightly antalgic gait but ambulating independently Assessment/ Plan: 89 y.o. male   Lumbar radiculopathy - Plan: oxyCODONE-acetaminophen (PERCOCET/ROXICET) 5-325 MG tablet  I have renewed the oxycodone he will continue gabapentin for now.  We discussed that his second opinion tomorrow may simply reiterate that the treatments that were provided already are appropriate.  However, I did instruct him that if he is needing ongoing opioid therapy until next back injections can be provided etc. then I would like to have him seen back in office so that we can complete controlled substance contract and urine drug screen.  I have given him enough medication to use for the next 2 weeks but I have highly recommended that he use this extremely sparingly and depend on gabapentin primarily for pain control as he is at high risk of having adverse side effects from these medications which are very sedating.  Both he and his daughter voiced good understanding and will contact our office for an appointment if needed for pain management going forward.  The national narcotic database was reviewed and there were no red flags.  Plan for UDS and CSC if needed  No orders of the defined types were placed in this encounter.  No orders of the defined types were placed in  this encounter.    [name], DO [place] [phone]

## 2022-11-21 ENCOUNTER — Encounter: Payer: Self-pay | Admitting: Orthopedic Surgery

## 2022-11-21 ENCOUNTER — Ambulatory Visit (INDEPENDENT_AMBULATORY_CARE_PROVIDER_SITE_OTHER): Payer: Medicare Other | Admitting: Orthopedic Surgery

## 2022-11-21 VITALS — BP 122/67 | Ht 70.0 in | Wt 156.6 lb

## 2022-11-21 DIAGNOSIS — M5416 Radiculopathy, lumbar region: Secondary | ICD-10-CM

## 2022-11-21 DIAGNOSIS — M4726 Other spondylosis with radiculopathy, lumbar region: Secondary | ICD-10-CM

## 2022-11-21 DIAGNOSIS — M47816 Spondylosis without myelopathy or radiculopathy, lumbar region: Secondary | ICD-10-CM

## 2022-11-21 DIAGNOSIS — M48061 Spinal stenosis, lumbar region without neurogenic claudication: Secondary | ICD-10-CM | POA: Diagnosis not present

## 2022-11-21 NOTE — Patient Instructions (Signed)
It was so nice to see you today. Thank you so much for coming in.    You have wear and tear in your back (arthritis) along with spinal stenosis (pressure on the spinal cord). All of this is likely contributing to your leg pain.   I recommend that you give the injection more time as I think it will continue to help. Then, you should either continue care with [facility] or with [place].   If you want to stay with [facility], then I would make a follow up with them to see if they think you need another injection.   If you want to stay with [place], I would put in a referral for you to see physical medicine and rehab at the [facility]  (next to [facility]). Dr. Yves Dill, Dr. Mariah Milling, and their PA Alphonzo Lemmings are great and will take good care of you.   Please do not hesitate to call if you have any questions or concerns. You can also message me in MyChart.   Let me know what you decide.   Charles Leach PA-C [phone]

## 2022-11-23 ENCOUNTER — Telehealth: Payer: Self-pay | Admitting: Family Medicine

## 2022-11-23 NOTE — Telephone Encounter (Signed)
Pt called requesting to speak with Dr Jeannett Senior nurse regarding pain management. Says she was told by Dr Nadine Counts to call office and speak with [facility] about this.

## 2022-11-23 NOTE — Telephone Encounter (Signed)
Call LVM for Valene Bors to return re: pain mgmnt msg

## 2022-11-27 ENCOUNTER — Other Ambulatory Visit: Payer: Self-pay | Admitting: Family Medicine

## 2022-11-27 NOTE — Telephone Encounter (Signed)
Appointment scheduled.

## 2022-11-27 NOTE — Telephone Encounter (Signed)
Pts daughter called requesting to speak to [facility] regarding pain management.

## 2022-11-29 ENCOUNTER — Encounter (HOSPITAL_BASED_OUTPATIENT_CLINIC_OR_DEPARTMENT_OTHER): Payer: Self-pay | Admitting: Emergency Medicine

## 2022-11-29 ENCOUNTER — Other Ambulatory Visit: Payer: Self-pay

## 2022-11-29 ENCOUNTER — Emergency Department (HOSPITAL_BASED_OUTPATIENT_CLINIC_OR_DEPARTMENT_OTHER): Payer: Medicare Other | Admitting: Radiology

## 2022-11-29 ENCOUNTER — Emergency Department (HOSPITAL_BASED_OUTPATIENT_CLINIC_OR_DEPARTMENT_OTHER)
Admission: EM | Admit: 2022-11-29 | Discharge: 2022-11-29 | Disposition: A | Discharge status: Home / Self Care | Payer: Medicare Other | Attending: Emergency Medicine | Admitting: Emergency Medicine

## 2022-11-29 DIAGNOSIS — R630 Anorexia: Secondary | ICD-10-CM | POA: Diagnosis not present

## 2022-11-29 DIAGNOSIS — Z7982 Long term (current) use of aspirin: Secondary | ICD-10-CM | POA: Diagnosis not present

## 2022-11-29 DIAGNOSIS — I878 Other specified disorders of veins: Secondary | ICD-10-CM | POA: Diagnosis not present

## 2022-11-29 DIAGNOSIS — R140 Abdominal distension (gaseous): Secondary | ICD-10-CM | POA: Diagnosis not present

## 2022-11-29 DIAGNOSIS — K5900 Constipation, unspecified: Secondary | ICD-10-CM | POA: Insufficient documentation

## 2022-11-29 DIAGNOSIS — R109 Unspecified abdominal pain: Secondary | ICD-10-CM | POA: Insufficient documentation

## 2022-11-29 DIAGNOSIS — Z7901 Long term (current) use of anticoagulants: Secondary | ICD-10-CM | POA: Insufficient documentation

## 2022-11-29 MED ORDER — GLYCERIN (ADULT) 2 G RE SUPP: 1.0000 | RECTAL | 0 refills | Status: DC | PRN

## 2022-11-29 NOTE — ED Provider Notes (Signed)
[place] EMERGENCY DEPARTMENT AT [facility] Provider Note   CSN: [id number] Arrival date & time: 11/29/22  4098     History  Chief Complaint  Patient presents with   Constipation    Charles Golden is a 89 y.o. male.  HPI   89 year old male presents emergency department with concern for constipation.  Patient was recently placed on gabapentin and opiate medication for nerve pain.  He is been taking this but became constipated, last bowel movement a week ago.  Typically patient has a bowel movement every 2 days.  He has been using magnesium citrate, Dulcolax and other agents.  He is having some slight abdominal bloating with cramping and passing small amounts of liquid stool.  He also has the sensation of rectal fullness. No fever.  He has decreased appetite but no nausea.  He is still passing gas.  Home Medications Prior to Admission medications   Medication Sig Start Date End Date Taking? Authorizing Provider  alum & mag hydroxide-simeth (MAALOX PLUS) 400-400-40 MG/5ML suspension Take 15 mLs by mouth every 6 (six) hours as needed (gas). 10/22/22   Mesner, Barbara Cower, MD  apixaban (ELIQUIS) 2.5 MG TABS tablet Take 1 tablet (2.5 mg total) by mouth 2 (two) times daily. 01/24/22   [name], DO  aspirin EC 81 MG tablet Take 1 tablet (81 mg total) by mouth daily. Swallow whole. 08/03/21   Micki Riley, MD  atorvastatin (LIPITOR) 40 MG tablet Take 1 tablet (40 mg total) by mouth daily. 01/24/22   [name], DO  azelastine (ASTELIN) 0.1 % nasal spray PLACE 1 SPRAY IN EACH NOSTRIL ONCE A DAY AS DIRECTED 11/27/22   Delynn Flavin M, DO  Cholecalciferol (VITAMIN D3) 2000 UNITS TABS Take 1 tablet by mouth daily.    [provider]  CRANBERRY PO Take by mouth.    [provider]  Cyanocobalamin (VITAMIN B-12) 5000 MCG SUBL Place 1 tablet under the tongue daily.    [provider]  diclofenac Sodium (VOLTAREN) 1 % GEL Apply 2 g topically 4 (four)  times daily as needed (arthritis). 08/08/22   [name], DO  docusate sodium (COLACE) 100 MG capsule Take 1 capsule (100 mg total) by mouth every 12 (twelve) hours. 10/22/22   Mesner, Barbara Cower, MD  finasteride (PROSCAR) 5 MG tablet Take 5 mg by mouth daily.    [provider]  fluticasone (FLONASE) 50 MCG/ACT nasal spray Place 2 sprays into both nostrils daily. 01/03/22   [name], DO  Fluticasone-Umeclidin-Vilant (TRELEGY ELLIPTA) 100-62.5-25 MCG/ACT AEPB Inhale 1 puff into the lungs every evening. 09/17/22   Coralyn Helling, MD  gabapentin (NEURONTIN) 300 MG capsule Take 1 capsule (300 mg total) by mouth 3 (three) times daily. 11/05/22   [name], DO  guaiFENesin (MUCINEX) 600 MG 12 hr tablet Take 2 tablets (1,200 mg total) by mouth 2 (two) times daily as needed for to loosen phlegm or cough. 09/29/21   Coralyn Helling, MD  latanoprost (XALATAN) 0.005 % ophthalmic solution Place 1 drop into the left eye at bedtime. Patient taking differently: Place 1 drop into both eyes at bedtime. 06/01/16   Ernestina Penna, MD  oxyCODONE-acetaminophen (PERCOCET/ROXICET) 5-325 MG tablet Take 0.5-1 tablets by mouth every 8 (eight) hours as needed for severe pain (USE ONLY IF NEEDED). 11/20/22   Delynn Flavin M, DO  pantoprazole (PROTONIX) 40 MG tablet TAKE 1 TABLET 2 TIMES A DAY BEFORE A MEAL 06/08/22   Delynn Flavin  M, DO  polyethylene glycol (MIRALAX / GLYCOLAX) 17 g packet Take 17 g by mouth daily. 10/22/22   Mesner, Barbara Cower, MD  silodosin (RAPAFLO) 8 MG CAPS capsule Take 8 mg by mouth daily. 06/02/21   [provider]  timolol (TIMOPTIC) 0.5 % ophthalmic solution Place 1 drop into the left eye every morning. 11/24/18   [provider]  VENTOLIN HFA 108 (90 Base) MCG/ACT inhaler Inhale 2 puffs into the lungs every 6 (six) hours as needed for wheezing or shortness of breath. 09/29/21   Coralyn Helling, MD      Allergies    Demerol [meperidine] and Spiriva handihaler  [tiotropium bromide monohydrate]    Review of Systems   Review of Systems  Constitutional:  Positive for appetite change and fatigue. Negative for fever.  Respiratory:  Negative for shortness of breath.   Cardiovascular:  Negative for chest pain.  Gastrointestinal:  Positive for abdominal distention, abdominal pain, constipation and rectal pain. Negative for blood in stool, diarrhea and vomiting.  Skin:  Negative for rash.  Neurological:  Negative for headaches.    Physical Exam Updated Vital Signs BP (!) 179/86 (BP Location: Right Arm)   Pulse 97   Temp 98.1 F (36.7 C) (Oral)   Resp 19   Ht 5\' 10"  (1.778 m)   Wt 70.8 kg   SpO2 99%   BMI 22.38 kg/m  Physical Exam Vitals and nursing note reviewed.  Constitutional:      Appearance: Normal appearance.  HENT:     Head: Normocephalic.     Mouth/Throat:     Mouth: Mucous membranes are moist.  Cardiovascular:     Rate and Rhythm: Normal rate.  Pulmonary:     Effort: Pulmonary effort is normal. No respiratory distress.  Abdominal:     Palpations: Abdomen is soft.     Tenderness: There is abdominal tenderness.     Comments: Diffuse mild tenderness to palpation with mild bloating but no significant distention, normal bowel sounds  Skin:    General: Skin is warm.  Neurological:     Mental Status: He is alert and oriented to person, place, and time. Mental status is at baseline.  Psychiatric:        Mood and Affect: Mood normal.     ED Results / Procedures / Treatments   Labs (all labs ordered are listed, but only abnormal results are displayed) Labs Reviewed - No data to display  EKG None  Radiology DG Abd 2 Views  Result Date: 11/29/2022 CLINICAL DATA:  Bloated abdomen EXAM: ABDOMEN - 2 VIEW COMPARISON:  Abdominal CT 10/22/2022 FINDINGS: The bowel gas pattern is normal. There is no evidence of free air. Moderate stool in the colon especially the ascending and descending segments. No concerning mass effect or  calcification. Lung bases are clear. Lumbar spine degeneration and scoliosis. Extensive atherosclerosis and pelvic phleboliths. IMPRESSION: Normal bowel gas pattern with moderate stool retention. Electronically Signed   By: Tiburcio Pea M.D.   On: 11/29/2022 07:34    Procedures Fecal disimpaction  Date/Time: 11/29/2022 10:18 AM  Performed by: Rozelle Logan, DO Authorized by: Rozelle Logan, DO  Consent: Verbal consent obtained. Consent given by: patient Patient understanding: patient states understanding of the procedure being performed Test results: test results available and properly labeled Imaging studies: imaging studies available Patient identity confirmed: verbally with patient Local anesthesia used: no  Anesthesia: Local anesthesia used: no  Sedation: Patient sedated: no  Comments: Digital exam revealed hard stool  in the rectal vault that was removed.  Patient tolerated well.       Medications Ordered in ED Medications - No data to display  ED Course/ Medical Decision Making/ A&P                             Medical Decision Making Amount and/or Complexity of Data Reviewed Radiology: ordered.  Risk OTC drugs.   89 year old male presents emergency department constipation and no bowel movement for the past week.  Recently on gabapentin and pain medicine for nerve pain.  Abdomen is soft, slightly diffusely tender.  Normal bowel sounds.  Abdominal x-ray shows moderate stool but no signs of obstruction.  Digital exam revealed hard stool in the rectal vault, fecal disimpaction performed.  Patient received enema.  Small amount of hard/soft stool relieved per nurse.  Since then patient has had multiple watery bowel movements and bedside commode.  Slight relief in abdominal cramping/distention.  Had a long discussion with the patient and his family member that he is going to continue to have frequent/loose bowel movements today given the amount of magnesium  citrate and oral medicine that he took.  I have recommended a bowel regimen to resume tomorrow to prevent rebound constipation.  Patient continues to relieve himself in the bedside commode, is requesting discharge home.  Patient at this time appears safe and stable for discharge and close outpatient follow up. Discharge plan and strict return to ED precautions discussed, patient verbalizes understanding and agreement.        Final Clinical Impression(s) / ED Diagnoses Final diagnoses:  None    Rx / DC Orders ED Discharge Orders     None         Rozelle Logan, DO 11/29/22 1029

## 2022-11-29 NOTE — ED Notes (Signed)
Patient given discharge instructions. Questions were answered. Patient verbalized understanding of discharge instructions and care at home. ? ?Discharged with family ?

## 2022-11-29 NOTE — ED Triage Notes (Signed)
Pt reports constipation x 1 week, pt reports he has been taking Gabapentin and Percocet due to a pinched nerve, pt took magnesium citrate yesterday, took 4 dulcolax since last night, pt reports small amounts of liquid stools since last night, pt reports abd distention

## 2022-11-29 NOTE — ED Notes (Addendum)
1L soap suds enema given. Tolerated well. Up to [facility]. Some initial unformed return. Will monitor and re-assess. Sitting. CBIR.

## 2022-11-29 NOTE — Discharge Instructions (Addendum)
You have been seen and discharged from the emergency department.  Stay well-hydrated today.  You will continue to have multiple loose stool bowel movements today as the retained stool completely relieves itself.  It may take all of today for your symptoms to significantly improve as you continue to relieve yourself.  Starting tomorrow you may continue a bowel regimen.  I would recommend at least a capful of MiraLAX.  If you feel like there is still some retained/hard stool you may use glycerin suppository as needed.  Follow-up with your primary provider for further evaluation and further care. Take home medications as prescribed. If you have any worsening symptoms, severe abdominal distention, stop having bowel movement/passing gas or further concerns for your health please return to an emergency department for further evaluation.

## 2022-12-04 ENCOUNTER — Encounter: Payer: Self-pay | Admitting: Family Medicine

## 2022-12-04 ENCOUNTER — Ambulatory Visit (INDEPENDENT_AMBULATORY_CARE_PROVIDER_SITE_OTHER): Payer: Medicare Other | Admitting: Family Medicine

## 2022-12-04 ENCOUNTER — Ambulatory Visit: Payer: Medicare Other

## 2022-12-04 VITALS — BP 119/60 | HR 70 | Temp 98.7°F | Ht 70.0 in | Wt 162.0 lb

## 2022-12-04 DIAGNOSIS — R32 Unspecified urinary incontinence: Secondary | ICD-10-CM | POA: Diagnosis not present

## 2022-12-04 DIAGNOSIS — M25462 Effusion, left knee: Secondary | ICD-10-CM | POA: Diagnosis not present

## 2022-12-04 DIAGNOSIS — K5909 Other constipation: Secondary | ICD-10-CM

## 2022-12-04 DIAGNOSIS — M25562 Pain in left knee: Secondary | ICD-10-CM | POA: Diagnosis not present

## 2022-12-04 MED ORDER — TRULANCE 3 MG PO TABS
1.0000 | ORAL_TABLET | Freq: Every day | ORAL | 0 refills | Status: DC
Start: 2022-12-04 — End: 2022-12-11

## 2022-12-04 MED ORDER — LINACLOTIDE 72 MCG PO CAPS
72.0000 ug | ORAL_CAPSULE | Freq: Every day | ORAL | 0 refills | Status: DC
Start: 2022-12-04 — End: 2022-12-11

## 2022-12-04 NOTE — Patient Instructions (Signed)
Call Dr [facility] office for follow up visit In the meantime, I have provided 2 medication samples to aid with constipation (Trulance and Linzess).  Use one or the other once daily until gone. Let me know which works better and I can prescribe Ok to use Colace but stop miralax while taking Referral to Dr Aluscio's office sent.  Use ice, soft brace and topical voltaren gel as needed for pain/ swelling. Ok to reduce gabapentin to 1 capsule twice daily as long as pain is controlled.

## 2022-12-04 NOTE — Progress Notes (Unsigned)
Subjective: CC:*** PCP: [name], DO [id number] R Charles Golden is a 89 y.o. male presenting to clinic today for:  1. ***   ROS: Per HPI  Allergies  Allergen Reactions   Demerol [Meperidine] Other (See Comments)    Pt. States "he woke up during a colonoscopy"   Spiriva Handihaler [Tiotropium Bromide Monohydrate] Other (See Comments)    Mild Urinary Retention   Past Medical History:  Diagnosis Date   Acute medial meniscal tear 07/20/2014   Atrial fibrillation with RVR (HCC) 05/10/2021   Chronic headache 08/05/2013   COPD (chronic obstructive pulmonary disease) (HCC)    CVA (cerebral vascular accident) (HCC) 05/01/2021   Enlarged prostate    GERD (gastroesophageal reflux disease)    Glaucoma    Glucagonoma    Hernia, incisional    at present   [facility] (hard of hearing)    IBS (irritable bowel syndrome)    Incisional hernia, without obstruction or gangrene 08/05/2013   Sciatic pain    Stage 3b chronic kidney disease (CKD) (HCC)     Current Outpatient Medications:    alum & mag hydroxide-simeth (MAALOX PLUS) 400-400-40 MG/5ML suspension, Take 15 mLs by mouth every 6 (six) hours as needed (gas)., Disp: 355 mL, Rfl: 0   apixaban (ELIQUIS) 2.5 MG TABS tablet, Take 1 tablet (2.5 mg total) by mouth 2 (two) times daily., Disp: 180 tablet, Rfl: 3   aspirin EC 81 MG tablet, Take 1 tablet (81 mg total) by mouth daily. Swallow whole., Disp: 30 tablet, Rfl: 11   atorvastatin (LIPITOR) 40 MG tablet, Take 1 tablet (40 mg total) by mouth daily., Disp: 90 tablet, Rfl: 3   azelastine (ASTELIN) 0.1 % nasal spray, PLACE 1 SPRAY IN EACH NOSTRIL ONCE A DAY AS DIRECTED, Disp: 30 mL, Rfl: 5   Cholecalciferol (VITAMIN D3) 2000 UNITS TABS, Take 1 tablet by mouth daily., Disp: , Rfl:    CRANBERRY PO, Take by mouth., Disp: , Rfl:    Cyanocobalamin (VITAMIN B-12) 5000 MCG SUBL, Place 1 tablet under the tongue daily., Disp: , Rfl:    diclofenac Sodium (VOLTAREN) 1 % GEL, Apply 2 g topically 4 (four)  times daily as needed (arthritis)., Disp: 200 g, Rfl: PRN   docusate sodium (COLACE) 100 MG capsule, Take 1 capsule (100 mg total) by mouth every 12 (twelve) hours., Disp: 60 capsule, Rfl: 0   finasteride (PROSCAR) 5 MG tablet, Take 5 mg by mouth daily., Disp: , Rfl:    fluticasone (FLONASE) 50 MCG/ACT nasal spray, Place 2 sprays into both nostrils daily., Disp: 16 g, Rfl: 11   Fluticasone-Umeclidin-Vilant (TRELEGY ELLIPTA) 100-62.5-25 MCG/ACT AEPB, Inhale 1 puff into the lungs every evening., Disp: 60 each, Rfl: 5   gabapentin (NEURONTIN) 300 MG capsule, Take 1 capsule (300 mg total) by mouth 3 (three) times daily., Disp: 90 capsule, Rfl: 1   glycerin adult 2 g suppository, Place 1 suppository rectally as needed for constipation., Disp: 10 suppository, Rfl: 0   guaiFENesin (MUCINEX) 600 MG 12 hr tablet, Take 2 tablets (1,200 mg total) by mouth 2 (two) times daily as needed for to loosen phlegm or cough., Disp: , Rfl:    latanoprost (XALATAN) 0.005 % ophthalmic solution, Place 1 drop into the left eye at bedtime. (Patient taking differently: Place 1 drop into both eyes at bedtime.), Disp: 2.5 mL, Rfl: 1   pantoprazole (PROTONIX) 40 MG tablet, TAKE 1 TABLET 2 TIMES A DAY BEFORE A MEAL, Disp: 180 tablet, Rfl: 1   polyethylene glycol (  MIRALAX / GLYCOLAX) 17 g packet, Take 17 g by mouth daily., Disp: 14 each, Rfl: 0   silodosin (RAPAFLO) 8 MG CAPS capsule, Take 8 mg by mouth daily., Disp: , Rfl:    timolol (TIMOPTIC) 0.5 % ophthalmic solution, Place 1 drop into the left eye every morning., Disp: , Rfl:    VENTOLIN HFA 108 (90 Base) MCG/ACT inhaler, Inhale 2 puffs into the lungs every 6 (six) hours as needed for wheezing or shortness of breath., Disp: 6.7 g, Rfl: 5   oxyCODONE-acetaminophen (PERCOCET/ROXICET) 5-325 MG tablet, Take 0.5-1 tablets by mouth every 8 (eight) hours as needed for severe pain (USE ONLY IF NEEDED). (Patient not taking: Reported on 12/04/2022), Disp: 60 tablet, Rfl: 0 Social History    Socioeconomic History   Marital status: Married    Spouse name: Charles Golden   Number of children: 3   Years of education: 12+   Highest education level: Bachelor's degree (e.g., BA, AB, BS)  Occupational History   Occupation: Retired    Comment: Programmer, systems  Tobacco Use   Smoking status: Former    Packs/day: 1.00    Years: 30.00    Additional pack years: 0.00    Total pack years: 30.00    Types: Cigarettes    Quit date: 05/28/2006    Years since quitting: 16.5   Smokeless tobacco: Never  Vaping Use   Vaping Use: Never used  Substance and Sexual Activity   Alcohol use: Not Currently    Alcohol/week: 0.0 standard drinks of alcohol   Drug use: No   Sexual activity: Yes    Birth control/protection: None  Other Topics Concern   Not on file  Social History Narrative   Patient lives at home with [name].    Patient has 3 children.    Patient has his BS   Patient is retired.    Drinks about 2 cups of coffee per day.      [facility]:   He is still married. He previously worked as a Programmer, systems. No significant dust exposure. He mostly worked with synthetic fibers. He is from [place].       Social Determinants of Health   Financial Resource Strain: Low Risk  (02/09/2020)   Overall Financial Resource Strain (CARDIA)    Difficulty of Paying Living Expenses: Not hard at all  Food Insecurity: No Food Insecurity (01/01/2022)   Hunger Vital Sign    Worried About Running Out of Food in the Last Year: Never true    Ran Out of Food in the Last Year: Never true  Transportation Needs: No Transportation Needs (01/01/2022)   PRAPARE - Administrator, Civil Service (Medical): No    Lack of Transportation (Non-Medical): No  Physical Activity: Insufficiently Active (05/19/2021)   Exercise Vital Sign    Days of Exercise per Week: 1 day    Minutes of Exercise per Session: 10 min  Stress: Stress Concern Present (07/17/2021)   [employer] of Occupational  Health - Occupational Stress Questionnaire    Feeling of Stress : To some extent  Social Connections: Socially Integrated (06/09/2019)   Social Connection and Isolation Panel [NHANES]    Frequency of Communication with Friends and Family: More than three times a week    Frequency of Social Gatherings with Friends and Family: More than three times a week    Attends Religious Services: More than 4 times per year    Active Member of [employer] or Organizations: Yes  Attends Banker Meetings: More than 4 times per year    Marital Status: Married  Catering manager Violence: Not At Risk (01/01/2022)   Humiliation, Afraid, Rape, and Kick questionnaire    Fear of Current or Ex-Partner: No    Emotionally Abused: No    Physically Abused: No    Sexually Abused: No   Family History  Problem Relation Age of Onset   Heart disease Mother        Valve replacement and pacemaker   Colon cancer Father    Asthma Other        cousin   Healthy Daughter    Healthy Daughter    Healthy Daughter     Objective: Office vital signs reviewed. Ht 5\' 10"  (1.778 m)   BMI 22.38 kg/m   Physical Examination:  General: Awake, alert, *** nourished, No acute distress HEENT: Normal    Neck: No masses palpated. No lymphadenopathy    Ears: Tympanic membranes intact, normal light reflex, no erythema, no bulging    Eyes: PERRLA, extraocular membranes intact, sclera ***    Nose: nasal turbinates moist, *** nasal discharge    Throat: moist mucus membranes, no erythema, *** tonsillar exudate.  Airway is patent Cardio: regular rate and rhythm, S1S2 heard, no murmurs appreciated Pulm: clear to auscultation bilaterally, no wheezes, rhonchi or rales; normal work of breathing on room air GI: soft, non-tender, non-distended, bowel sounds present x4, no hepatomegaly, no splenomegaly, no masses GU: external vaginal tissue ***, cervix ***, *** punctate lesions on cervix appreciated, *** discharge from cervical os, ***  bleeding, *** cervical motion tenderness, *** abdominal/ adnexal masses Extremities: warm, well perfused, No edema, cyanosis or clubbing; +*** pulses bilaterally MSK: *** gait and *** station Skin: dry; intact; no rashes or lesions Neuro: *** Strength and light touch sensation grossly intact, *** DTRs ***/4  Assessment/ Plan: 89 y.o. male   ***  No orders of the defined types were placed in this encounter.  No orders of the defined types were placed in this encounter.    [name], DO [facility] [phone]

## 2022-12-05 DIAGNOSIS — M25562 Pain in left knee: Secondary | ICD-10-CM | POA: Diagnosis not present

## 2022-12-06 ENCOUNTER — Encounter: Payer: Self-pay | Admitting: Family Medicine

## 2022-12-10 ENCOUNTER — Encounter: Payer: Self-pay | Admitting: Family Medicine

## 2022-12-10 DIAGNOSIS — K5909 Other constipation: Secondary | ICD-10-CM

## 2022-12-11 ENCOUNTER — Encounter: Payer: Self-pay | Admitting: Family Medicine

## 2022-12-11 MED ORDER — LUBIPROSTONE 24 MCG PO CAPS
24.0000 ug | ORAL_CAPSULE | Freq: Two times a day (BID) | ORAL | 3 refills | Status: AC
Start: 2022-12-11 — End: ?

## 2022-12-12 ENCOUNTER — Other Ambulatory Visit: Payer: Self-pay | Admitting: Family Medicine

## 2022-12-12 DIAGNOSIS — J449 Chronic obstructive pulmonary disease, unspecified: Secondary | ICD-10-CM

## 2022-12-13 ENCOUNTER — Other Ambulatory Visit (HOSPITAL_BASED_OUTPATIENT_CLINIC_OR_DEPARTMENT_OTHER): Payer: Self-pay | Admitting: Neurosurgery

## 2022-12-13 DIAGNOSIS — M25562 Pain in left knee: Secondary | ICD-10-CM

## 2022-12-13 DIAGNOSIS — M48062 Spinal stenosis, lumbar region with neurogenic claudication: Secondary | ICD-10-CM | POA: Diagnosis not present

## 2022-12-14 ENCOUNTER — Ambulatory Visit (INDEPENDENT_AMBULATORY_CARE_PROVIDER_SITE_OTHER): Payer: Medicare Other | Admitting: Family Medicine

## 2022-12-14 ENCOUNTER — Ambulatory Visit (INDEPENDENT_AMBULATORY_CARE_PROVIDER_SITE_OTHER): Payer: Medicare Other

## 2022-12-14 ENCOUNTER — Encounter: Payer: Self-pay | Admitting: Family Medicine

## 2022-12-14 VITALS — BP 134/68 | HR 79 | Temp 97.5°F | Ht 70.0 in | Wt 167.0 lb

## 2022-12-14 DIAGNOSIS — R600 Localized edema: Secondary | ICD-10-CM

## 2022-12-14 DIAGNOSIS — R635 Abnormal weight gain: Secondary | ICD-10-CM | POA: Diagnosis not present

## 2022-12-14 DIAGNOSIS — R609 Edema, unspecified: Secondary | ICD-10-CM | POA: Diagnosis not present

## 2022-12-14 LAB — CBC WITH DIFFERENTIAL/PLATELET
Eos: 5 %
Immature Grans (Abs): 0 10*3/uL (ref 0.0–0.1)
MCH: 30.5 pg (ref 26.6–33.0)
MCV: 94 fL (ref 79–97)
Monocytes Absolute: 0.7 10*3/uL (ref 0.1–0.9)
Neutrophils Absolute: 5.5 10*3/uL (ref 1.4–7.0)
Platelets: 339 10*3/uL (ref 150–450)
WBC: 7.8 10*3/uL (ref 3.4–10.8)

## 2022-12-14 LAB — CMP14+EGFR: Calcium: 8.5 mg/dL — ABNORMAL LOW (ref 8.6–10.2)

## 2022-12-14 LAB — PRO B NATRIURETIC PEPTIDE

## 2022-12-14 MED ORDER — FUROSEMIDE 20 MG PO TABS
20.0000 mg | ORAL_TABLET | Freq: Every day | ORAL | 0 refills | Status: DC | PRN
Start: 2022-12-14 — End: 2022-12-19

## 2022-12-14 NOTE — Progress Notes (Unsigned)
Acute Office Visit  Subjective:     Patient ID: Charles Golden, male    DOB: Nov 22, 1925, 89 y.o.   MRN: [id number]  Chief Complaint  Patient presents with   Leg Swelling    Noticed both legs swollen yesterday at neurologist was told by neurologist to come to regular DR    HPI Here with daughter today. Patient is in today for edema in his lower legs for the last few weeks. Reports unchanged. Denies shortness of breath, chest pain, edema, early satiety, orthopnea. He has gained weight per our records with a 5 lb weight gain over the last 10 days., 11 lbs over last 15 days. He hasn't been weighing at home.   ROS As per HPI.      Objective:    BP 134/68   Pulse 79   Temp (!) 97.5 F (36.4 C) (Temporal)   Ht 5\' 10"  (1.778 m)   Wt 167 lb (75.8 kg)   SpO2 98%   BMI 23.96 kg/m  Wt Readings from Last 3 Encounters:  12/14/22 167 lb (75.8 kg)  12/04/22 162 lb (73.5 kg)  11/29/22 156 lb (70.8 kg)      Physical Exam Vitals and nursing note reviewed.  Constitutional:      General: He is not in acute distress.    Appearance: He is not ill-appearing, toxic-appearing or diaphoretic.  Cardiovascular:     Rate and Rhythm: Normal rate and regular rhythm.  Pulmonary:     Effort: Pulmonary effort is normal. No respiratory distress.     Breath sounds: No wheezing, rhonchi or rales.  Abdominal:     General: Bowel sounds are normal. There is no distension.     Palpations: Abdomen is soft.     Tenderness: There is no abdominal tenderness. There is no guarding.  Musculoskeletal:     Cervical back: Neck supple. No rigidity.     Right lower leg: 2+ Pitting Edema present.     Left lower leg: 2+ Pitting Edema present.     Comments: 2+ bilaterally pitting edema to mid calf.   Skin:    General: Skin is warm and dry.  Neurological:     Mental Status: He is alert and oriented to person, place, and time. Mental status is at baseline.  Psychiatric:        Mood and Affect: Mood normal.         Behavior: Behavior normal.     No results found for any visits on 12/14/22.      Assessment & Plan:   Gildardo was seen today for leg swelling.  Diagnoses and all orders for this visit:  Peripheral edema -     CBC with Differential/Platelet -     CMP14+EGFR -     TSH -     Pro b natriuretic peptide -     DG Chest 2 View; Future -     furosemide (LASIX) 20 MG tablet; Take 1-2 tablets (20-40 mg total) by mouth daily as needed for edema. (Patient not taking: Reported on 12/17/2022)  Weight gain -     CBC with Differential/Platelet -     CMP14+EGFR -     TSH -     Pro b natriuretic peptide -     DG Chest 2 View; Future -     furosemide (LASIX) 20 MG tablet; Take 1-2 tablets (20-40 mg total) by mouth daily as needed for edema. (Patient not taking: Reported on 12/17/2022)  Discussed  concerns for heart failure. Lungs clear on exam, no edema on CXR, agree with radiology read. Will try lasix 20 mg. If no improvement, increase to 40 mg. Labs pending as above. Follow up appt scheduled with PCP next week. Follow up sooner for new or worsening symptoms.   The patient indicates understanding of these issues and agrees with the plan.  Gabriel Earing, FNP

## 2022-12-15 LAB — CBC WITH DIFFERENTIAL/PLATELET
Basophils Absolute: 0 10*3/uL (ref 0.0–0.2)
EOS (ABSOLUTE): 0.4 10*3/uL (ref 0.0–0.4)
Hematocrit: 28.5 % — ABNORMAL LOW (ref 37.5–51.0)
Lymphocytes Absolute: 1.2 10*3/uL (ref 0.7–3.1)
Monocytes: 8 %
RBC: 3.02 x10E6/uL — ABNORMAL LOW (ref 4.14–5.80)

## 2022-12-15 LAB — CMP14+EGFR
Chloride: 111 mmol/L — ABNORMAL HIGH (ref 96–106)
Potassium: 5.9 mmol/L (ref 3.5–5.2)

## 2022-12-17 ENCOUNTER — Inpatient Hospital Stay (HOSPITAL_COMMUNITY)
Admission: EM | Admit: 2022-12-17 | Discharge: 2022-12-19 | DRG: 683 | Disposition: A | Discharge status: Home / Self Care | Payer: Medicare Other | Attending: Internal Medicine | Admitting: Internal Medicine

## 2022-12-17 ENCOUNTER — Emergency Department (HOSPITAL_COMMUNITY): Payer: Medicare Other

## 2022-12-17 ENCOUNTER — Encounter (HOSPITAL_COMMUNITY): Payer: Self-pay

## 2022-12-17 ENCOUNTER — Other Ambulatory Visit: Payer: Self-pay

## 2022-12-17 DIAGNOSIS — N179 Acute kidney failure, unspecified: Secondary | ICD-10-CM | POA: Diagnosis not present

## 2022-12-17 DIAGNOSIS — Z9049 Acquired absence of other specified parts of digestive tract: Secondary | ICD-10-CM

## 2022-12-17 DIAGNOSIS — N138 Other obstructive and reflux uropathy: Secondary | ICD-10-CM | POA: Diagnosis present

## 2022-12-17 DIAGNOSIS — N1339 Other hydronephrosis: Secondary | ICD-10-CM | POA: Diagnosis present

## 2022-12-17 DIAGNOSIS — H409 Unspecified glaucoma: Secondary | ICD-10-CM | POA: Diagnosis present

## 2022-12-17 DIAGNOSIS — R531 Weakness: Secondary | ICD-10-CM

## 2022-12-17 DIAGNOSIS — Z800 Family history of malignant neoplasm of digestive organs: Secondary | ICD-10-CM

## 2022-12-17 DIAGNOSIS — N281 Cyst of kidney, acquired: Secondary | ICD-10-CM | POA: Diagnosis not present

## 2022-12-17 DIAGNOSIS — Z7901 Long term (current) use of anticoagulants: Secondary | ICD-10-CM

## 2022-12-17 DIAGNOSIS — Z7951 Long term (current) use of inhaled steroids: Secondary | ICD-10-CM

## 2022-12-17 DIAGNOSIS — Z8673 Personal history of transient ischemic attack (TIA), and cerebral infarction without residual deficits: Secondary | ICD-10-CM

## 2022-12-17 DIAGNOSIS — I4891 Unspecified atrial fibrillation: Secondary | ICD-10-CM | POA: Diagnosis present

## 2022-12-17 DIAGNOSIS — Z79899 Other long term (current) drug therapy: Secondary | ICD-10-CM | POA: Diagnosis not present

## 2022-12-17 DIAGNOSIS — E8779 Other fluid overload: Secondary | ICD-10-CM | POA: Diagnosis not present

## 2022-12-17 DIAGNOSIS — Z87891 Personal history of nicotine dependence: Secondary | ICD-10-CM | POA: Diagnosis not present

## 2022-12-17 DIAGNOSIS — Z7982 Long term (current) use of aspirin: Secondary | ICD-10-CM

## 2022-12-17 DIAGNOSIS — N1832 Chronic kidney disease, stage 3b: Secondary | ICD-10-CM | POA: Diagnosis not present

## 2022-12-17 DIAGNOSIS — N134 Hydroureter: Secondary | ICD-10-CM | POA: Diagnosis not present

## 2022-12-17 DIAGNOSIS — M25561 Pain in right knee: Secondary | ICD-10-CM | POA: Diagnosis not present

## 2022-12-17 DIAGNOSIS — E875 Hyperkalemia: Secondary | ICD-10-CM | POA: Diagnosis present

## 2022-12-17 DIAGNOSIS — Z885 Allergy status to narcotic agent status: Secondary | ICD-10-CM | POA: Diagnosis not present

## 2022-12-17 DIAGNOSIS — N320 Bladder-neck obstruction: Secondary | ICD-10-CM | POA: Diagnosis not present

## 2022-12-17 DIAGNOSIS — K219 Gastro-esophageal reflux disease without esophagitis: Secondary | ICD-10-CM | POA: Diagnosis present

## 2022-12-17 DIAGNOSIS — N401 Enlarged prostate with lower urinary tract symptoms: Secondary | ICD-10-CM | POA: Diagnosis not present

## 2022-12-17 DIAGNOSIS — N132 Hydronephrosis with renal and ureteral calculous obstruction: Secondary | ICD-10-CM | POA: Diagnosis not present

## 2022-12-17 DIAGNOSIS — R262 Difficulty in walking, not elsewhere classified: Secondary | ICD-10-CM | POA: Diagnosis present

## 2022-12-17 DIAGNOSIS — M5416 Radiculopathy, lumbar region: Secondary | ICD-10-CM

## 2022-12-17 DIAGNOSIS — Z825 Family history of asthma and other chronic lower respiratory diseases: Secondary | ICD-10-CM | POA: Diagnosis not present

## 2022-12-17 DIAGNOSIS — J479 Bronchiectasis, uncomplicated: Secondary | ICD-10-CM | POA: Diagnosis present

## 2022-12-17 DIAGNOSIS — N400 Enlarged prostate without lower urinary tract symptoms: Secondary | ICD-10-CM | POA: Diagnosis not present

## 2022-12-17 DIAGNOSIS — Z888 Allergy status to other drugs, medicaments and biological substances status: Secondary | ICD-10-CM | POA: Diagnosis not present

## 2022-12-17 DIAGNOSIS — Z8249 Family history of ischemic heart disease and other diseases of the circulatory system: Secondary | ICD-10-CM

## 2022-12-17 LAB — CBC
HCT: 32.5 % — ABNORMAL LOW (ref 39.0–52.0)
Hemoglobin: 10 g/dL — ABNORMAL LOW (ref 13.0–17.0)
MCH: 31.7 pg (ref 26.0–34.0)
MCHC: 30.8 g/dL (ref 30.0–36.0)
MCV: 103.2 fL — ABNORMAL HIGH (ref 80.0–100.0)
Platelets: 364 10*3/uL (ref 150–400)
RBC: 3.15 MIL/uL — ABNORMAL LOW (ref 4.22–5.81)
RDW: 14.7 % (ref 11.5–15.5)
WBC: 7.1 10*3/uL (ref 4.0–10.5)
nRBC: 0 % (ref 0.0–0.2)

## 2022-12-17 LAB — URINALYSIS, ROUTINE W REFLEX MICROSCOPIC
Bilirubin Urine: NEGATIVE
Glucose, UA: NEGATIVE mg/dL
Ketones, ur: NEGATIVE mg/dL
Nitrite: NEGATIVE
Protein, ur: NEGATIVE mg/dL
RBC / HPF: >50 RBC/hpf (ref 0–5)
Specific Gravity, Urine: 1.009 (ref 1.005–1.030)
pH: 5 (ref 5.0–8.0)

## 2022-12-17 LAB — CBC WITH DIFFERENTIAL/PLATELET
Basos: 0 %
Hemoglobin: 9.2 g/dL — ABNORMAL LOW (ref 13.0–17.7)
Immature Granulocytes: 0 %
Lymphs: 15 %
MCHC: 32.3 g/dL (ref 31.5–35.7)
Neutrophils: 72 %
RDW: 12.5 % (ref 11.6–15.4)

## 2022-12-17 LAB — BASIC METABOLIC PANEL
Anion gap: 9 (ref 5–15)
BUN: 36 mg/dL — ABNORMAL HIGH (ref 8–23)
CO2: 18 mmol/L — ABNORMAL LOW (ref 22–32)
Calcium: 8.6 mg/dL — ABNORMAL LOW (ref 8.9–10.3)
Chloride: 113 mmol/L — ABNORMAL HIGH (ref 98–111)
Creatinine, Ser: 4.56 mg/dL — ABNORMAL HIGH (ref 0.61–1.24)
GFR, Estimated: 11 mL/min — ABNORMAL LOW (ref >60)
Glucose, Bld: 117 mg/dL — ABNORMAL HIGH (ref 70–99)
Potassium: 4.9 mmol/L (ref 3.5–5.1)
Sodium: 140 mmol/L (ref 135–145)

## 2022-12-17 LAB — PHOSPHORUS: Phosphorus: 4.3 mg/dL (ref 2.5–4.6)

## 2022-12-17 LAB — COMPREHENSIVE METABOLIC PANEL
ALT: 14 U/L (ref 0–44)
AST: 17 U/L (ref 15–41)
Albumin: 2.5 g/dL — ABNORMAL LOW (ref 3.5–5.0)
Alkaline Phosphatase: 76 U/L (ref 38–126)
Anion gap: 8 (ref 5–15)
BUN: 33 mg/dL — ABNORMAL HIGH (ref 8–23)
CO2: 19 mmol/L — ABNORMAL LOW (ref 22–32)
Calcium: 8.2 mg/dL — ABNORMAL LOW (ref 8.9–10.3)
Chloride: 111 mmol/L (ref 98–111)
Creatinine, Ser: 3.86 mg/dL — ABNORMAL HIGH (ref 0.61–1.24)
GFR, Estimated: 14 mL/min — ABNORMAL LOW (ref >60)
Glucose, Bld: 122 mg/dL — ABNORMAL HIGH (ref 70–99)
Potassium: 4.8 mmol/L (ref 3.5–5.1)
Sodium: 138 mmol/L (ref 135–145)
Total Bilirubin: 0.4 mg/dL (ref 0.3–1.2)
Total Protein: 5.4 g/dL — ABNORMAL LOW (ref 6.5–8.1)

## 2022-12-17 LAB — CMP14+EGFR
Albumin: 3.2 g/dL — ABNORMAL LOW (ref 3.6–4.6)
Alkaline Phosphatase: 96 IU/L (ref 44–121)
BUN/Creatinine Ratio: 9 — ABNORMAL LOW (ref 10–24)
BUN: 45 mg/dL — ABNORMAL HIGH (ref 10–36)
Bilirubin Total: 0.2 mg/dL (ref 0.0–1.2)
CO2: 16 mmol/L — ABNORMAL LOW (ref 20–29)
Sodium: 143 mmol/L (ref 134–144)
Total Protein: 5.7 g/dL — ABNORMAL LOW (ref 6.0–8.5)

## 2022-12-17 LAB — MAGNESIUM
Magnesium: 1.7 mg/dL (ref 1.7–2.4)
Magnesium: 1.7 mg/dL (ref 1.7–2.4)

## 2022-12-17 LAB — TSH: TSH: 2.48 u[IU]/mL (ref 0.450–4.500)

## 2022-12-17 MED ORDER — ONDANSETRON HCL 4 MG/2ML IJ SOLN: 4.0000 mg | Freq: Four times a day (QID) | INTRAMUSCULAR | Status: DC | PRN

## 2022-12-17 MED ORDER — FLUTICASONE FUROATE-VILANTEROL 100-25 MCG/ACT IN AEPB
1.0000 | INHALATION_SPRAY | Freq: Every day | RESPIRATORY_TRACT | Status: DC
  Administered 2022-12-18 – 2022-12-19 (×2): 1 via RESPIRATORY_TRACT
  Filled 2022-12-17: qty 28

## 2022-12-17 MED ORDER — SODIUM CHLORIDE 0.9 % IV SOLN: INTRAVENOUS | Status: AC

## 2022-12-17 MED ORDER — VITAMIN D 25 MCG (1000 UNIT) PO TABS
1000.0000 [IU] | ORAL_TABLET | Freq: Every evening | ORAL | Status: DC
  Administered 2022-12-17 – 2022-12-18 (×2): 1000 [IU] via ORAL
  Filled 2022-12-17 (×2): qty 1

## 2022-12-17 MED ORDER — TAMSULOSIN HCL 0.4 MG PO CAPS
0.4000 mg | ORAL_CAPSULE | Freq: Every day | ORAL | Status: DC
  Administered 2022-12-18: 0.4 mg via ORAL
  Filled 2022-12-17: qty 1

## 2022-12-17 MED ORDER — LATANOPROST 0.005 % OP SOLN
1.0000 [drp] | Freq: Every day | OPHTHALMIC | Status: DC
  Administered 2022-12-17 – 2022-12-18 (×2): 1 [drp] via OPHTHALMIC
  Filled 2022-12-17: qty 2.5

## 2022-12-17 MED ORDER — ALBUTEROL SULFATE (2.5 MG/3ML) 0.083% IN NEBU: 3.0000 mL | INHALATION_SOLUTION | Freq: Four times a day (QID) | RESPIRATORY_TRACT | Status: DC | PRN

## 2022-12-17 MED ORDER — ASPIRIN 81 MG PO TBEC
81.0000 mg | DELAYED_RELEASE_TABLET | Freq: Every day | ORAL | Status: DC
  Administered 2022-12-18 – 2022-12-19 (×2): 81 mg via ORAL
  Filled 2022-12-17 (×2): qty 1

## 2022-12-17 MED ORDER — UMECLIDINIUM BROMIDE 62.5 MCG/ACT IN AEPB
1.0000 | INHALATION_SPRAY | Freq: Every day | RESPIRATORY_TRACT | Status: DC
  Administered 2022-12-18 – 2022-12-19 (×2): 1 via RESPIRATORY_TRACT
  Filled 2022-12-17: qty 7

## 2022-12-17 MED ORDER — SODIUM CHLORIDE 0.9 % IV BOLUS
500.0000 mL | Freq: Once | INTRAVENOUS | Status: AC
  Administered 2022-12-17: 500 mL via INTRAVENOUS

## 2022-12-17 MED ORDER — LUBIPROSTONE 24 MCG PO CAPS
24.0000 ug | ORAL_CAPSULE | Freq: Two times a day (BID) | ORAL | Status: DC
  Administered 2022-12-18 – 2022-12-19 (×3): 24 ug via ORAL
  Filled 2022-12-17 (×4): qty 1

## 2022-12-17 MED ORDER — TIMOLOL MALEATE 0.5 % OP SOLN
1.0000 [drp] | Freq: Every morning | OPHTHALMIC | Status: DC
  Administered 2022-12-18 – 2022-12-19 (×2): 1 [drp] via OPHTHALMIC
  Filled 2022-12-17: qty 5

## 2022-12-17 MED ORDER — FINASTERIDE 5 MG PO TABS
5.0000 mg | ORAL_TABLET | Freq: Every day | ORAL | Status: DC
  Administered 2022-12-18 – 2022-12-19 (×2): 5 mg via ORAL
  Filled 2022-12-17 (×2): qty 1

## 2022-12-17 MED ORDER — ONDANSETRON HCL 4 MG PO TABS
4.0000 mg | ORAL_TABLET | Freq: Four times a day (QID) | ORAL | Status: DC | PRN
  Administered 2022-12-18: 4 mg via ORAL
  Filled 2022-12-17: qty 1

## 2022-12-17 MED ORDER — ATORVASTATIN CALCIUM 40 MG PO TABS
40.0000 mg | ORAL_TABLET | Freq: Every day | ORAL | Status: DC
  Administered 2022-12-18 – 2022-12-19 (×2): 40 mg via ORAL
  Filled 2022-12-17 (×2): qty 1

## 2022-12-17 MED ORDER — APIXABAN 2.5 MG PO TABS
2.5000 mg | ORAL_TABLET | Freq: Two times a day (BID) | ORAL | Status: DC
  Administered 2022-12-17 – 2022-12-19 (×4): 2.5 mg via ORAL
  Filled 2022-12-17 (×4): qty 1

## 2022-12-17 MED ORDER — OXYCODONE-ACETAMINOPHEN 5-325 MG PO TABS: 0.5000 | ORAL_TABLET | Freq: Three times a day (TID) | ORAL | Status: DC | PRN

## 2022-12-17 MED ORDER — NON FORMULARY: 1.0000 | Freq: Every day | Status: DC

## 2022-12-17 MED ORDER — DOCUSATE SODIUM 100 MG PO CAPS
100.0000 mg | ORAL_CAPSULE | Freq: Two times a day (BID) | ORAL | Status: DC
  Administered 2022-12-17 – 2022-12-19 (×4): 100 mg via ORAL
  Filled 2022-12-17 (×4): qty 1

## 2022-12-17 MED ORDER — ACETAMINOPHEN 500 MG PO TABS
1000.0000 mg | ORAL_TABLET | Freq: Every day | ORAL | Status: DC | PRN
  Administered 2022-12-17 – 2022-12-19 (×4): 1000 mg via ORAL
  Filled 2022-12-17 (×4): qty 2

## 2022-12-17 NOTE — Progress Notes (Signed)
New Admission Note:   Arrival Method: stretcher Mental Orientation: aa+ox4 Telemetry: Box 4, SR Assessment: Completed Skin: C/D/I IV: left arm, NS at 50 ml/h started Pain: denies Tubes: n/a Safety Measures: Safety Fall Prevention Plan has been given, discussed and signed Admission: Completed [facility] Orientation: Patient has been orientated to the room, unit and staff.  Family: daughter present  Pt. Is currently unable to urinate, BP 172/82 manually, bladder scanner greater than 728 ml, message sent to Dr. Maisie Fus, awaiting new orders.  Orders have been reviewed and implemented. Will continue to monitor the patient. Call light has been placed within reach and bed alarm has been activated.   [name], RN

## 2022-12-17 NOTE — Plan of Care (Signed)

## 2022-12-17 NOTE — Progress Notes (Signed)
Bladder scan showed greater than 728 ml, Dr. Maisie Fus made aware via secure chat, pt. Now agreeing to have foley placed, 14 French placed with urine return without difficulty  [name]

## 2022-12-17 NOTE — ED Notes (Signed)
Patient refused to have foley catheter replaced. MD aware. Will continue to monitor

## 2022-12-17 NOTE — H&P (Signed)
History and Physical    Charles Golden XBJ:[id number] DOB: 05-Feb-1926 DOA: 12/17/2022  PCP: [name], DO  Patient coming from: home  I have personally briefly reviewed patient's old medical records in [facility]  Chief Complaint: abnormal renal function on labs   HPI: Charles Golden is a 89 y.o. male with medical history significant of bronchiectasis, COPD  Atrial fibrillation on eliquis,  right knee pain , GERD, BPH, HOH, CKDIIIb, Glaucoma, hx of CVA  Who presents to ED in referral from pcp after labs completed over the weekend returned abnormal renal function.  Patient notes mild nausea, continued lower extremity swelling, no cough fever/chills, or dysuria, or abdominal pain.   ED Course: On evaluation in ED patient was found have AKI on CKDIIIb due to bladder outlet obstruction with CT noting distended bladder and b/l hydronephrosis , patient has foley placed  was slated to admission for further care.  Vitals: Afeb, bp 156/92, hr 85, rr 15, sat 100% onra  Wbc7.1, hgb 10 (12.1) mcv 103,plt 364 Na 140, K 4.9, cl 113, bicarb 18, cr 4.56 ( 1.5) UA: bacteria rare LE trace  rbc Mag1.7  CT stone  protocol IMPRESSION: 1. Moderate bilateral hydronephrosis and hydroureter. No obstructing mass or ureteric calculi identified. Markedly dilated urinary bladder reaching above the superior iliac crest. Markedly enlarged prostate gland with median lobe projecting into the bladder base. Findings are suggestive of bladder outlet obstruction. 2. There is a 4 x 4 mm nonobstructing calculus in the right upper ureter. Additional bilateral sub 5 mm renal calculi. 3. Multiple other nonacute observations, as described above.  Tx 500cc ivfs  Review of Systems: As per HPI otherwise 10 point review of systems negative.   Past Medical History:  Diagnosis Date   Acute medial meniscal tear 07/20/2014   Atrial fibrillation with RVR (HCC) 05/10/2021   Chronic headache 08/05/2013   COPD  (chronic obstructive pulmonary disease) (HCC)    CVA (cerebral vascular accident) (HCC) 05/01/2021   Enlarged prostate    GERD (gastroesophageal reflux disease)    Glaucoma    Glucagonoma    Hernia, incisional    at present   [facility] (hard of hearing)    IBS (irritable bowel syndrome)    Incisional hernia, without obstruction or gangrene 08/05/2013   Sciatic pain    Stage 3b chronic kidney disease (CKD) (HCC)     Past Surgical History:  Procedure Laterality Date   BOWEL RESECTION N/A 09/04/2012   Procedure: SMALL BOWEL RESECTION;  Surgeon: Fabio Bering, MD;  Location: [facility];  Service: General;  Laterality: N/A;  Anastimosis   HEMORRHOID SURGERY     HERNIA REPAIR Bilateral 70's   INGUINAL HERNIA REPAIR Left 09/14/2013   Procedure: LEFT INGUINAL HERNIORRHAPHY;  Surgeon: Dalia Heading, MD;  Location: [facility];  Service: General;  Laterality: Left;   INGUINAL HERNIA REPAIR Right 12/23/2018   Procedure: RECURRENT RIGHT INGUINAL HERNIA  REPAIR  WITH MESH;  Surgeon: Franky Macho, MD;  Location: [facility];  Service: General;  Laterality: Right;   INSERTION OF MESH Left 09/14/2013   Procedure: INSERTION OF MESH;  Surgeon: Dalia Heading, MD;  Location: [facility];  Service: General;  Laterality: Left;   IR ANGIO INTRA EXTRACRAN SEL INTERNAL CAROTID UNI L MOD SED  05/04/2021   IR INTRAVSC STENT CERV CAROTID W/EMB-PROT MOD SED INCL ANGIO  05/04/2021   IR US GUIDE VASC ACCESS RIGHT  05/04/2021   KNEE ARTHROSCOPY Left 07/21/2014  Procedure: LEFT KNEE ARTHROSCOPY WITH MEDIAL MENISCAL DEBRIDEMENT ;  Surgeon: Loanne Drilling, MD;  Location: [facility];  Service: Orthopedics;  Laterality: Left;   LAPAROTOMY N/A 09/04/2012   Procedure: EXPLORATORY LAPAROTOMY;  Surgeon: Fabio Bering, MD;  Location: [facility];  Service: General;  Laterality: N/A;   RADIOLOGY WITH ANESTHESIA Left 05/04/2021   Procedure: LEFT ICA STENT;  Surgeon: Baldemar Lenis, MD;  Location: [facility];  Service: Radiology;  Laterality: Left;    SKIN LESION EXCISION     Dr Lazarus Salines   TONSILLECTOMY       reports that he quit smoking about 16 years ago. His smoking use included cigarettes. He started smoking about 46 years ago. He has a 30 pack-year smoking history. He has never used smokeless tobacco. He reports that he does not currently use alcohol. He reports that he does not use drugs.  Allergies  Allergen Reactions   Demerol [Meperidine] Other (See Comments)    Pt. States "he woke up during a colonoscopy"   Spiriva Handihaler [Tiotropium Bromide Monohydrate] Other (See Comments)    Mild Urinary Retention    Family History  Problem Relation Age of Onset   Heart disease Mother        Valve replacement and pacemaker   Colon cancer Father    Asthma Other        cousin   Healthy Daughter    Healthy Daughter    Healthy Daughter     Prior to Admission medications   Medication Sig Start Date End Date Taking? Authorizing Provider  acetaminophen (TYLENOL) 500 MG tablet Take 1,000 mg by mouth daily as needed for headache or moderate pain.   Yes [provider]  apixaban (ELIQUIS) 2.5 MG TABS tablet Take 1 tablet (2.5 mg total) by mouth 2 (two) times daily. 01/24/22  Yes Delynn Flavin M, DO  aspirin EC 81 MG tablet Take 1 tablet (81 mg total) by mouth daily. Swallow whole. 08/03/21  Yes Micki Riley, MD  atorvastatin (LIPITOR) 40 MG tablet Take 1 tablet (40 mg total) by mouth daily. 01/24/22  Yes Gottschalk, Ashly M, DO  azelastine (ASTELIN) 0.1 % nasal spray PLACE 1 SPRAY IN EACH NOSTRIL ONCE A DAY AS DIRECTED Patient taking differently: Place 1 spray into both nostrils at bedtime. 11/27/22  Yes Delynn Flavin M, DO  Cholecalciferol (VITAMIN D3) 2000 UNITS TABS Take 1 tablet by mouth every evening.   Yes [provider]  CRANBERRY PO Take 1 tablet by mouth every evening.   Yes [provider]  Cyanocobalamin (VITAMIN B-12 PO) Take 1 tablet by mouth every evening.   Yes [provider]   diclofenac Sodium (VOLTAREN) 1 % GEL Apply 2 g topically 4 (four) times daily as needed (arthritis). 08/08/22  Yes Delynn Flavin M, DO  docusate sodium (COLACE) 100 MG capsule Take 1 capsule (100 mg total) by mouth every 12 (twelve) hours. Patient taking differently: Take 100 mg by mouth every evening. 10/22/22  Yes Mesner, Barbara Cower, MD  finasteride (PROSCAR) 5 MG tablet Take 5 mg by mouth daily.   Yes [provider]  fluticasone (FLONASE) 50 MCG/ACT nasal spray Place 2 sprays into both nostrils daily. Patient taking differently: Place 2 sprays into both nostrils at bedtime. 01/03/22  Yes Gottschalk, Kathie Rhodes M, DO  Fluticasone-Umeclidin-Vilant (TRELEGY ELLIPTA) 100-62.5-25 MCG/ACT AEPB Inhale 1 puff into the lungs every evening. Patient taking differently: Inhale 1 puff into the lungs daily. 09/17/22  Yes Coralyn Helling, MD  gabapentin (NEURONTIN) 300 MG capsule Take 1 capsule (300 mg total) by mouth 3 (three) times daily. Patient taking differently: Take 300 mg by mouth at bedtime. 11/05/22  Yes Gottschalk, Ashly M, DO  guaiFENesin (MUCINEX) 600 MG 12 hr tablet Take 2 tablets (1,200 mg total) by mouth 2 (two) times daily as needed for to loosen phlegm or cough. Patient taking differently: Take 600 mg by mouth 2 (two) times daily. 09/29/21  Yes Coralyn Helling, MD  latanoprost (XALATAN) 0.005 % ophthalmic solution Place 1 drop into the left eye at bedtime. Patient taking differently: Place 1 drop into both eyes at bedtime. 06/01/16  Yes Ernestina Penna, MD  lubiprostone (AMITIZA) 24 MCG capsule Take 1 capsule (24 mcg total) by mouth 2 (two) times daily with a meal. For constipation 12/11/22  Yes Gottschalk, Ashly M, DO  oxyCODONE-acetaminophen (PERCOCET/ROXICET) 5-325 MG tablet Take 0.5-1 tablets by mouth every 8 (eight) hours as needed for severe pain (USE ONLY IF NEEDED). 11/20/22  Yes Delynn Flavin M, DO  silodosin (RAPAFLO) 8 MG CAPS capsule Take 8 mg by mouth daily. 06/02/21  Yes [provider]  timolol (TIMOPTIC) 0.5 % ophthalmic solution Place 1 drop into the left eye every morning. 11/24/18  Yes [provider]  VENTOLIN HFA 108 (90 Base) MCG/ACT inhaler INHALE 2 PUFFS EVERY 6 HOURS AS NEEDED FOR WHEEZING OR SHORTNESS OF BREATH 12/12/22  Yes Gottschalk, Ashly M, DO  furosemide (LASIX) 20 MG tablet Take 1-2 tablets (20-40 mg total) by mouth daily as needed for edema. Patient not taking: Reported on 12/17/2022 12/14/22   Gabriel Earing, FNP    Physical Exam: Vitals:   12/17/22 1354 12/17/22 1400 12/17/22 1430 12/17/22 1738  BP:  (!) 163/73 (!) 149/97 (!) 172/82  Pulse:  66 62 78  Resp:   20 18  Temp: 98 F (36.7 C)   98.8 F (37.1 C)  TempSrc:    Oral  SpO2:  100% 100% 100%  Weight:    73.8 kg  Height:    5\' 10"  (1.778 m)    Constitutional: NAD, calm, comfortable Vitals:   12/17/22 1354 12/17/22 1400 12/17/22 1430 12/17/22 1738  BP:  (!) 163/73 (!) 149/97 (!) 172/82  Pulse:  66 62 78  Resp:   20 18  Temp: 98 F (36.7 C)   98.8 F (37.1 C)  TempSrc:    Oral  SpO2:  100% 100% 100%  Weight:    73.8 kg  Height:    5\' 10"  (1.778 m)   Eyes: PERRL, lids and conjunctivae normal ENMT: Mucous membranes are moist. Posterior pharynx clear of any exudate or lesions.Normal dentition.  Neck: normal, supple, no masses, no thyromegaly Respiratory: clear to auscultation bilaterally, no wheezing, no crackles. Normal respiratory effort. No accessory muscle use.  Cardiovascular: Regular rate and rhythm, no murmurs / rubs / gallops.+ extremity edema. 2+ pedal pulses.  Abdomen: no tenderness, no masses palpated. No hepatosplenomegaly. Bowel sounds positive.  Musculoskeletal: no clubbing / cyanosis. No joint deformity upper and lower extremities. Good ROM, no contractures. Normal muscle tone.  Skin: no rashes, lesions, ulcers. No induration Neurologic: CN 2-12 grossly intact. Sensation intact, Strength 5/5 in all 4.  Psychiatric: Normal judgment and insight.  Alert and oriented x 3. Normal mood.    Labs on Admission: I have personally reviewed following labs and imaging studies  CBC: Recent Labs  Lab 12/14/22 1011 12/17/22 0921  WBC 7.8 7.1  NEUTROABS 5.5  --   HGB  9.2* 10.0*  HCT 28.5* 32.5*  MCV 94 103.2*  PLT 339 364   Basic Metabolic Panel: Recent Labs  Lab 12/14/22 1011 12/17/22 0921 12/17/22 1047  NA 143 140  --   K 5.9* 4.9  --   CL 111* 113*  --   CO2 16* 18*  --   GLUCOSE 88 117*  --   BUN 45* 36*  --   CREATININE 4.76* 4.56*  --   CALCIUM 8.5* 8.6*  --   MG  --   --  1.7   GFR: Estimated Creatinine Clearance: 10 mL/min (A) (by C-G formula based on SCr of 4.56 mg/dL (H)). Liver Function Tests: Recent Labs  Lab 12/14/22 1011  AST 14  ALT 13  ALKPHOS 96  BILITOT 0.2  PROT 5.7*  ALBUMIN 3.2*   No results for input(s): "LIPASE", "AMYLASE" in the last 168 hours. No results for input(s): "AMMONIA" in the last 168 hours. Coagulation Profile: No results for input(s): "INR", "PROTIME" in the last 168 hours. Cardiac Enzymes: No results for input(s): "CKTOTAL", "CKMB", "CKMBINDEX", "TROPONINI" in the last 168 hours. BNP (last 3 results) Recent Labs    12/14/22 1011  PROBNP 1,661*   HbA1C: No results for input(s): "HGBA1C" in the last 72 hours. CBG: No results for input(s): "GLUCAP" in the last 168 hours. Lipid Profile: No results for input(s): "CHOL", "HDL", "LDLCALC", "TRIG", "CHOLHDL", "LDLDIRECT" in the last 72 hours. Thyroid Function Tests: No results for input(s): "TSH", "T4TOTAL", "FREET4", "T3FREE", "THYROIDAB" in the last 72 hours. Anemia Panel: No results for input(s): "VITAMINB12", "FOLATE", "FERRITIN", "TIBC", "IRON", "RETICCTPCT" in the last 72 hours. Urine analysis:    Component Value Date/Time   COLORURINE YELLOW 12/17/2022 0953   APPEARANCEUR HAZY (A) 12/17/2022 0953   APPEARANCEUR Clear 10/25/2021 0956   LABSPEC 1.009 12/17/2022 0953   PHURINE 5.0 12/17/2022 0953   GLUCOSEU NEGATIVE  12/17/2022 0953   HGBUR MODERATE (A) 12/17/2022 0953   BILIRUBINUR NEGATIVE 12/17/2022 0953   BILIRUBINUR Negative 10/25/2021 0956   KETONESUR NEGATIVE 12/17/2022 0953   PROTEINUR NEGATIVE 12/17/2022 0953   UROBILINOGEN 0.2 09/04/2012 1334   NITRITE NEGATIVE 12/17/2022 0953   LEUKOCYTESUR TRACE (A) 12/17/2022 0953    Radiological Exams on Admission: CT Renal Stone Study  Result Date: 12/17/2022 CLINICAL DATA:  Abdominal/flank pain, stone suspected EXAM: CT ABDOMEN AND PELVIS WITHOUT CONTRAST TECHNIQUE: Multidetector CT imaging of the abdomen and pelvis was performed following the standard protocol without IV contrast. RADIATION DOSE REDUCTION: This exam was performed according to the departmental dose-optimization program which includes automated exposure control, adjustment of the mA and/or kV according to patient size and/or use of iterative reconstruction technique. COMPARISON:  CT angiography chest abdomen and pelvis from 10/22/2022. FINDINGS: Lower chest: There are patchy atelectatic changes in the visualized lung bases. No overt consolidation. No pleural effusion. The heart is normal in size. No pericardial effusion. Hepatobiliary: The liver is normal in size. Non-cirrhotic configuration. No suspicious mass. No intrahepatic or extrahepatic bile duct dilation. No calcified gallstones. Normal gallbladder wall thickness. No pericholecystic inflammatory changes. Pancreas: Unremarkable. No pancreatic ductal dilatation or surrounding inflammatory changes. Spleen: Normal in size. There are multiple sub 5 mm calcified granulomas in the spleen. No other focal lesion. Adrenals/Urinary Tract: Adrenal glands are unremarkable. No suspicious renal mass. There is a 1.9 x 2.5 cm simple cyst arising from the left kidney upper pole, medially. Moderate right hydronephrosis noted. There is a nonobstructing calculus in the right upper ureter measuring 4 x 4 mm.  There are additional at least 3 sub 5 mm calculi in the  right kidney. No obstructing mass. Moderate left hydronephrosis noted. Left ureter is dilated. No obstructing mass or ureteric calculi. There are at least 3, sub 5 mm calculi in the left kidney. Urinary bladder is markedly dilated reaching above the superior iliac crest. No abnormal wall thickening or perivesical fat stranding. No focal mass or calculi. Stomach/Bowel: No disproportionate dilation of the small or large bowel loops. No evidence of abnormal bowel wall thickening or inflammatory changes. The appendix was not visualized; however there is no acute inflammatory process in the right lower quadrant. Enteroenteric anastomosis noted in the left upper abdomen, anteriorly. Vascular/Lymphatic: No ascites or pneumoperitoneum. No abdominal or pelvic lymphadenopathy, by size criteria. No aneurysmal dilation of the major abdominal arteries. There are marked peripheral atherosclerotic vascular calcifications of the aorta and its major branches. Reproductive: Markedly enlarged prostate gland with median lobe projecting into the bladder base. Other: The visualized soft tissues and abdominal wall are unremarkable. Musculoskeletal: No suspicious osseous lesions. There are mild - moderate multilevel degenerative changes in the visualized spine. IMPRESSION: 1. Moderate bilateral hydronephrosis and hydroureter. No obstructing mass or ureteric calculi identified. Markedly dilated urinary bladder reaching above the superior iliac crest. Markedly enlarged prostate gland with median lobe projecting into the bladder base. Findings are suggestive of bladder outlet obstruction. 2. There is a 4 x 4 mm nonobstructing calculus in the right upper ureter. Additional bilateral sub 5 mm renal calculi. 3. Multiple other nonacute observations, as described above. Aortic Atherosclerosis (ICD10-I70.0). Electronically Signed   By: Jules Schick M.D.   On: 12/17/2022 11:56    EKG: Independently reviewed.see above  Assessment/Plan  AKI on  CKDIIIb, in setting of bladder out let obstruction  - continue with foley cath - strict I/o  -gently ivf for 8  hours  -hold nephrotoxic medications  -holding lasix  -discussed with urology on call who noted continue foley and have patient follow up with his primary urology Dr Wilson Singer as out patient -hold Rapaflo  COPD  Bronchiectasis  - continue trelegy 100 one puff daily  -pulmonary toilet  -no acute exacerbation    Lower extremity Edema -possible due to CKD vs PVD/Cardiac  -echo in am for further evaluation  -ted hose  Atrial fibrillation  -continue on on eliquis  Right knee pain -s/p recent injury  -has mri planned as outpatient   GERD -ppi     BPH -will need f/u with dr Wilson Singer on discharge   Glaucoma,    hx of CVA -on ppx with Eliquis     DVT prophylaxis: eliquis Code Status: full/ as discussed per patient wishes in event of cardiac arrest  Family Communication: PAYNE,CAROLINE (Daughter) [phone] (Mobile)  Disposition Plan: patient  expected to be admitted greater than 2 midnights  Consults called: Urology :notes f/u out patient , if renal function does not improve consider nephrology consult per urology  Admission status: cardiac tele   Lurline Del MD [facility]   If 7PM-7AM, please contact night-coverage [email or web address] Password [facility]  12/17/2022, 6:29 PM

## 2022-12-17 NOTE — Plan of Care (Signed)

## 2022-12-17 NOTE — ED Provider Notes (Signed)
Emergency Department Provider Note   I have reviewed the triage vital signs and the nursing notes.   HISTORY  Chief Complaint No chief complaint on file.   HPI Charles Golden is a 89 y.o. male with past medical history reviewed below including CKD presents to the emergency department with abnormal blood work.  He was seen by his primary care physician on Friday for lower extremity swelling.  He was started on Lasix and took a single dose prior to the labs being drawn which showed creatinine drastically increased now to 4.6 with mild hyperkalemia of 5.9.  Patient has continued Lasix over the weekend as the results did not come back until this morning.  He was called and advised to present to the emergency department for immediate evaluation.  He continues to urinate frequently.  His appetite is slightly decreased and has some fatigue.  He does drink water regularly.  No other changes to medications. He does follow with Nephrology ([facility]) per daughter at bedside.    Past Medical History:  Diagnosis Date   Acute medial meniscal tear 07/20/2014   Atrial fibrillation with RVR (HCC) 05/10/2021   Chronic headache 08/05/2013   COPD (chronic obstructive pulmonary disease) (HCC)    CVA (cerebral vascular accident) (HCC) 05/01/2021   Enlarged prostate    GERD (gastroesophageal reflux disease)    Glaucoma    Glucagonoma    Hernia, incisional    at present   [facility] (hard of hearing)    IBS (irritable bowel syndrome)    Incisional hernia, without obstruction or gangrene 08/05/2013   Sciatic pain    Stage 3b chronic kidney disease (CKD) (HCC)     Review of Systems  Constitutional: No fever/chills. Positive fatigue.  Cardiovascular: Denies chest pain. Respiratory: Denies shortness of breath. Gastrointestinal: Intermittent abdominal pain.  No nausea, no vomiting.  No diarrhea.  No constipation. Genitourinary: Negative for dysuria. Musculoskeletal: Negative for back pain. Skin:  Negative for rash. Neurological: Negative for headaches.  ____________________________________________   PHYSICAL EXAM:  VITAL SIGNS: ED Triage Vitals [12/17/22 0909]  Encounter Vitals Group     BP (!) 156/92     Pulse Rate 85     Resp 15     Temp 97.9 F (36.6 C)     Temp Source Oral     SpO2 100 %   Constitutional: Alert and oriented. Well appearing and in no acute distress. Eyes: Conjunctivae are normal.  Head: Atraumatic. Nose: No congestion/rhinnorhea. Neck: No stridor.  Cardiovascular: Normal rate, regular rhythm. Good peripheral circulation. Grossly normal heart sounds.   Respiratory: Normal respiratory effort.  No retractions. Lungs CTAB. Gastrointestinal: Soft and nontender. No distention.  Musculoskeletal: No lower extremity tenderness. Pitting edema in the ankles bilaterally. No gross deformities of extremities. Neurologic:  Normal speech and language. No gross focal neurologic deficits are appreciated.  Skin:  Skin is warm, dry and intact. No rash noted.   ____________________________________________   LABS (all labs ordered are listed, but only abnormal results are displayed)  Labs Reviewed  CBC - Abnormal; Notable for the following components:      Result Value   RBC 3.15 (*)    Hemoglobin 10.0 (*)    HCT 32.5 (*)    MCV 103.2 (*)    All other components within normal limits  BASIC METABOLIC PANEL - Abnormal; Notable for the following components:   Chloride 113 (*)    CO2 18 (*)    Glucose, Bld 117 (*)  BUN 36 (*)    Creatinine, Ser 4.56 (*)    Calcium 8.6 (*)    GFR, Estimated 11 (*)    All other components within normal limits  URINALYSIS, ROUTINE W REFLEX MICROSCOPIC - Abnormal; Notable for the following components:   APPearance HAZY (*)    Hgb urine dipstick MODERATE (*)    Leukocytes,Ua TRACE (*)    Bacteria, UA RARE (*)    All other components within normal limits  COMPREHENSIVE METABOLIC PANEL - Abnormal; Notable for the following  components:   CO2 19 (*)    Glucose, Bld 122 (*)    BUN 33 (*)    Creatinine, Ser 3.86 (*)    Calcium 8.2 (*)    Total Protein 5.4 (*)    Albumin 2.5 (*)    GFR, Estimated 14 (*)    All other components within normal limits  COMPREHENSIVE METABOLIC PANEL - Abnormal; Notable for the following components:   Chloride 116 (*)    CO2 18 (*)    BUN 26 (*)    Creatinine, Ser 2.94 (*)    Calcium 8.0 (*)    Total Protein 4.8 (*)    Albumin 2.2 (*)    AST 12 (*)    GFR, Estimated 19 (*)    All other components within normal limits  CBC - Abnormal; Notable for the following components:   RBC 2.68 (*)    Hemoglobin 8.2 (*)    HCT 26.1 (*)    All other components within normal limits  CBC - Abnormal; Notable for the following components:   RBC 2.93 (*)    Hemoglobin 9.2 (*)    HCT 28.5 (*)    All other components within normal limits  BASIC METABOLIC PANEL - Abnormal; Notable for the following components:   CO2 20 (*)    Glucose, Bld 171 (*)    Creatinine, Ser 2.05 (*)    Calcium 8.6 (*)    GFR, Estimated 29 (*)    All other components within normal limits  URINE CULTURE  MAGNESIUM  MAGNESIUM  PHOSPHORUS    ____________________________________________   PROCEDURES  Procedure(s) performed:   Procedures  None  ____________________________________________   INITIAL IMPRESSION / ASSESSMENT AND PLAN / ED COURSE  Pertinent labs & imaging results that were available during my care of the patient were reviewed by me and considered in my medical decision making (see chart for details).   This patient is Presenting for Evaluation of abnormal lab/abdominal pain, which does require a range of treatment options, and is a complaint that involves a high risk of morbidity and mortality.  The Differential Diagnoses includes but is not exclusive to acute appendicitis, renal colic, testicular torsion, urinary tract infection, prostatitis,  diverticulitis, small bowel obstruction,  colitis, abdominal aortic aneurysm, gastroenteritis, constipation etc.   Critical Interventions-    Medications  0.9 %  sodium chloride infusion (0 mLs Intravenous Stopped 12/18/22 0413)  sodium chloride 0.9 % bolus 500 mL (0 mLs Intravenous Stopped 12/17/22 1328)    Reassessment after intervention: symptoms improved.    I did obtain Additional Historical Information from daughter at bedside.   I decided to review pertinent External Data, and in summary labs reviewed from 4/19 showing AKI and hyperkalemia.    Clinical Laboratory Tests Ordered, included basic metabolic panel confirms creatinine elevation to 4.56.  Potassium has normalized to 4.9.  Radiologic Tests Ordered, included CT renal. I independently interpreted the images and agree with radiology interpretation.  Cardiac Monitor Tracing which shows NSR.    Social Determinants of Health Risk patient is a non-smoker.   Consult complete with [facility]. Plan for admit.   Medical Decision Making: Summary:  Patient presents to the emergency department for evaluation of acute kidney injury found on labs as an outpatient.  He does not appear acutely volume overloaded on my assessment.  He does have some pitting edema in both ankles but no crackles or hypoxemia to suggest pulmonary edema.  Vital signs are within normal limits.  Clinically he overall appears prerenal.  Plan for small IV fluid bolus.  Lasix may not be playing as much of a factor as it appears he started that medication on the day that labs were drawn.   Reevaluation with update and discussion with patient and family. Discussed labs and plan for admit. Patient in agreement.   Patient's presentation is most consistent with acute presentation with potential threat to life or bodily function.   Disposition: admit  ____________________________________________  FINAL CLINICAL IMPRESSION(S) / ED DIAGNOSES  Final diagnoses:  AKI (acute kidney injury) (HCC)    Note:  This  document was prepared using Dragon voice recognition software and may include unintentional dictation errors.  Alona Bene, MD, [facility] Emergency Medicine    , Arlyss Repress, MD 12/28/22 [phone]

## 2022-12-17 NOTE — ED Triage Notes (Signed)
Pt states he got up this am and daughter said family doctor called and said his kidneys weren't working and to go to the ED; endorses chronic knee pain; endorses incontinence of bowel and bladder x several weeks; denies fevers

## 2022-12-17 NOTE — ED Notes (Signed)
ED TO INPATIENT HANDOFF REPORT  ED Nurse Name and Phone #: Moana Munford/ [phone]  S Name/Age/Gender Charles Golden 89 y.o. male Room/Bed: 047C/047C  Code Status   Code Status: Full Code  Home/SNF/Other Home Patient oriented to: self, place, time, and situation Is this baseline? Yes   Triage Complete: Triage complete  Chief Complaint AKI (acute kidney injury) (HCC) [N17.9]  Triage Note Pt states he got up this am and daughter said family doctor called and said his kidneys weren't working and to go to the ED; endorses chronic knee pain; endorses incontinence of bowel and bladder x several weeks; denies fevers   Allergies Allergies  Allergen Reactions   Demerol [Meperidine] Other (See Comments)    Pt. States "he woke up during a colonoscopy"   Spiriva Handihaler [Tiotropium Bromide Monohydrate] Other (See Comments)    Mild Urinary Retention    Level of Care/Admitting Diagnosis ED Disposition     ED Disposition  Admit   Condition  --   Comment  Hospital Area: [facility] [[id number]]  Level of Care: Telemetry Medical [104]  May admit patient to [facility] or [facility] if equivalent level of care is available:: No  Covid Evaluation: Asymptomatic - no recent exposure (last 10 days) testing not required  Diagnosis: AKI (acute kidney injury) [facility]) [[id number]]  Admitting Physician: Lurline Del [[id number]]  Attending Physician: Lurline Del [[id number]]  Certification:: I certify this patient will need inpatient services for at least 2 midnights  Estimated Length of Stay: 3          B Medical/Surgery History Past Medical History:  Diagnosis Date   Acute medial meniscal tear 07/20/2014   Atrial fibrillation with RVR (HCC) 05/10/2021   Chronic headache 08/05/2013   COPD (chronic obstructive pulmonary disease) (HCC)    CVA (cerebral vascular accident) (HCC) 05/01/2021   Enlarged prostate    GERD (gastroesophageal reflux disease)    Glaucoma     Glucagonoma    Hernia, incisional    at present   [facility] (hard of hearing)    IBS (irritable bowel syndrome)    Incisional hernia, without obstruction or gangrene 08/05/2013   Sciatic pain    Stage 3b chronic kidney disease (CKD) (HCC)    Past Surgical History:  Procedure Laterality Date   BOWEL RESECTION N/A 09/04/2012   Procedure: SMALL BOWEL RESECTION;  Surgeon: Fabio Bering, MD;  Location: [facility];  Service: General;  Laterality: N/A;  Anastimosis   HEMORRHOID SURGERY     HERNIA REPAIR Bilateral 70's   INGUINAL HERNIA REPAIR Left 09/14/2013   Procedure: LEFT INGUINAL HERNIORRHAPHY;  Surgeon: Dalia Heading, MD;  Location: [facility];  Service: General;  Laterality: Left;   INGUINAL HERNIA REPAIR Right 12/23/2018   Procedure: RECURRENT RIGHT INGUINAL HERNIA  REPAIR  WITH MESH;  Surgeon: Franky Macho, MD;  Location: [facility];  Service: General;  Laterality: Right;   INSERTION OF MESH Left 09/14/2013   Procedure: INSERTION OF MESH;  Surgeon: Dalia Heading, MD;  Location: [facility];  Service: General;  Laterality: Left;   IR ANGIO INTRA EXTRACRAN SEL INTERNAL CAROTID UNI L MOD SED  05/04/2021   IR INTRAVSC STENT CERV CAROTID W/EMB-PROT MOD SED INCL ANGIO  05/04/2021   IR US GUIDE VASC ACCESS RIGHT  05/04/2021   KNEE ARTHROSCOPY Left 07/21/2014   Procedure: LEFT KNEE ARTHROSCOPY WITH MEDIAL MENISCAL DEBRIDEMENT ;  Surgeon: Loanne Drilling, MD;  Location: [facility];  Service: Orthopedics;  Laterality:  Left;   LAPAROTOMY N/A 09/04/2012   Procedure: EXPLORATORY LAPAROTOMY;  Surgeon: Fabio Bering, MD;  Location: [facility];  Service: General;  Laterality: N/A;   RADIOLOGY WITH ANESTHESIA Left 05/04/2021   Procedure: LEFT ICA STENT;  Surgeon: Baldemar Lenis, MD;  Location: [facility] OR;  Service: Radiology;  Laterality: Left;   SKIN LESION EXCISION     Dr Lazarus Salines   TONSILLECTOMY       A IV Location/Drains/Wounds Patient Lines/Drains/Airways Status     Active Line/Drains/Airways     Name  Placement date Placement time Site Days   Peripheral IV 12/17/22 20 G Anterior;Left Forearm 12/17/22  1143  Forearm  less than 1            Intake/Output Last 24 hours  Intake/Output Summary (Last 24 hours) at 12/17/2022 1729 Last data filed at 12/17/2022 1451 Gross per 24 hour  Intake --  Output 3200 ml  Net -3200 ml    Labs/Imaging Results for orders placed or performed during the hospital encounter of 12/17/22 (from the past 48 hour(s))  CBC     Status: Abnormal   Collection Time: 12/17/22  9:21 AM  Result Value Ref Range   WBC 7.1 4.0 - 10.5 K/uL   RBC 3.15 (L) 4.22 - 5.81 MIL/uL   Hemoglobin 10.0 (L) 13.0 - 17.0 g/dL   HCT 78.2 (L) 95.6 - 21.3 %   MCV 103.2 (H) 80.0 - 100.0 fL   MCH 31.7 26.0 - 34.0 pg   MCHC 30.8 30.0 - 36.0 g/dL   RDW 08.6 57.8 - 46.9 %   Platelets 364 150 - 400 K/uL   nRBC 0.0 0.0 - 0.2 %    Comment: Performed at [facility], [address] [place], [place] [address]  Basic metabolic panel     Status: Abnormal   Collection Time: 12/17/22  9:21 AM  Result Value Ref Range   Sodium 140 135 - 145 mmol/L   Potassium 4.9 3.5 - 5.1 mmol/L   Chloride 113 (H) 98 - 111 mmol/L   CO2 18 (L) 22 - 32 mmol/L   Glucose, Bld 117 (H) 70 - 99 mg/dL    Comment: Glucose reference range applies only to samples taken after fasting for at least 8 hours.   BUN 36 (H) 8 - 23 mg/dL   Creatinine, Ser 8.41 (H) 0.61 - 1.24 mg/dL   Calcium 8.6 (L) 8.9 - 10.3 mg/dL   GFR, Estimated 11 (L) >60 mL/min    Comment: (NOTE) Calculated using the CKD-EPI Creatinine Equation (2021)    Anion gap 9 5 - 15    Comment: Performed at [facility], [address] [place], [place] [address]  Urinalysis, Routine w reflex microscopic -Urine, Clean Catch     Status: Abnormal   Collection Time: 12/17/22  9:53 AM  Result Value Ref Range   Color, Urine YELLOW YELLOW   APPearance HAZY (A) CLEAR   Specific Gravity, Urine 1.009 1.005 - 1.030   pH 5.0 5.0 - 8.0   Glucose, UA  NEGATIVE NEGATIVE mg/dL   Hgb urine dipstick MODERATE (A) NEGATIVE   Bilirubin Urine NEGATIVE NEGATIVE   Ketones, ur NEGATIVE NEGATIVE mg/dL   Protein, ur NEGATIVE NEGATIVE mg/dL   Nitrite NEGATIVE NEGATIVE   Leukocytes,Ua TRACE (A) NEGATIVE   RBC / HPF >50 0 - 5 RBC/hpf   WBC, UA 6-10 0 - 5 WBC/hpf   Bacteria, UA RARE (A) NONE SEEN   Squamous  Epithelial / HPF 0-5 0 - 5 /HPF   Mucus PRESENT     Comment: Performed at [facility], [address], [place], [place] [address]  Magnesium     Status: None   Collection Time: 12/17/22 10:47 AM  Result Value Ref Range   Magnesium 1.7 1.7 - 2.4 mg/dL    Comment: Performed at [facility], [address], [place], [place] [address]   CT Renal Stone Study  Result Date: 12/17/2022 CLINICAL DATA:  Abdominal/flank pain, stone suspected EXAM: CT ABDOMEN AND PELVIS WITHOUT CONTRAST TECHNIQUE: Multidetector CT imaging of the abdomen and pelvis was performed following the standard protocol without IV contrast. RADIATION DOSE REDUCTION: This exam was performed according to the departmental dose-optimization program which includes automated exposure control, adjustment of the mA and/or kV according to patient size and/or use of iterative reconstruction technique. COMPARISON:  CT angiography chest abdomen and pelvis from 10/22/2022. FINDINGS: Lower chest: There are patchy atelectatic changes in the visualized lung bases. No overt consolidation. No pleural effusion. The heart is normal in size. No pericardial effusion. Hepatobiliary: The liver is normal in size. Non-cirrhotic configuration. No suspicious mass. No intrahepatic or extrahepatic bile duct dilation. No calcified gallstones. Normal gallbladder wall thickness. No pericholecystic inflammatory changes. Pancreas: Unremarkable. No pancreatic ductal dilatation or surrounding inflammatory changes. Spleen: Normal in size. There are multiple sub 5 mm calcified granulomas in the spleen. No other focal  lesion. Adrenals/Urinary Tract: Adrenal glands are unremarkable. No suspicious renal mass. There is a 1.9 x 2.5 cm simple cyst arising from the left kidney upper pole, medially. Moderate right hydronephrosis noted. There is a nonobstructing calculus in the right upper ureter measuring 4 x 4 mm. There are additional at least 3 sub 5 mm calculi in the right kidney. No obstructing mass. Moderate left hydronephrosis noted. Left ureter is dilated. No obstructing mass or ureteric calculi. There are at least 3, sub 5 mm calculi in the left kidney. Urinary bladder is markedly dilated reaching above the superior iliac crest. No abnormal wall thickening or perivesical fat stranding. No focal mass or calculi. Stomach/Bowel: No disproportionate dilation of the small or large bowel loops. No evidence of abnormal bowel wall thickening or inflammatory changes. The appendix was not visualized; however there is no acute inflammatory process in the right lower quadrant. Enteroenteric anastomosis noted in the left upper abdomen, anteriorly. Vascular/Lymphatic: No ascites or pneumoperitoneum. No abdominal or pelvic lymphadenopathy, by size criteria. No aneurysmal dilation of the major abdominal arteries. There are marked peripheral atherosclerotic vascular calcifications of the aorta and its major branches. Reproductive: Markedly enlarged prostate gland with median lobe projecting into the bladder base. Other: The visualized soft tissues and abdominal wall are unremarkable. Musculoskeletal: No suspicious osseous lesions. There are mild - moderate multilevel degenerative changes in the visualized spine. IMPRESSION: 1. Moderate bilateral hydronephrosis and hydroureter. No obstructing mass or ureteric calculi identified. Markedly dilated urinary bladder reaching above the superior iliac crest. Markedly enlarged prostate gland with median lobe projecting into the bladder base. Findings are suggestive of bladder outlet obstruction. 2. There  is a 4 x 4 mm nonobstructing calculus in the right upper ureter. Additional bilateral sub 5 mm renal calculi. 3. Multiple other nonacute observations, as described above. Aortic Atherosclerosis (ICD10-I70.0). Electronically Signed   By: Jules Schick M.D.   On: 12/17/2022 11:56    Pending Labs Unresulted Labs (From admission, onward)     Start     Ordered   12/18/22 0500  Comprehensive metabolic panel  Tomorrow morning,   R        12/17/22 1629   12/18/22 0500  CBC  Tomorrow morning,   R        12/17/22 1629   12/17/22 1630  Comprehensive metabolic panel  Once,   R        12/17/22 1629   12/17/22 1630  Magnesium  Once,   R        12/17/22 1629   12/17/22 1630  Phosphorus  Once,   R        12/17/22 1629            Vitals/Pain Today's Vitals   12/17/22 1348 12/17/22 1354 12/17/22 1400 12/17/22 1430  BP: (!) 173/80  (!) 163/73 (!) 149/97  Pulse: 81  66 62  Resp: 16   20  Temp:  98 F (36.7 C)    TempSrc:      SpO2: 100%  100% 100%    Isolation Precautions No active isolations  Medications Medications  acetaminophen (TYLENOL) tablet 1,000 mg (has no administration in time range)  aspirin EC tablet 81 mg (has no administration in time range)  oxyCODONE-acetaminophen (PERCOCET/ROXICET) 5-325 MG per tablet 0.5-1 tablet (has no administration in time range)  atorvastatin (LIPITOR) tablet 40 mg (has no administration in time range)  lubiprostone (AMITIZA) capsule 24 mcg (has no administration in time range)  docusate sodium (COLACE) capsule 100 mg (has no administration in time range)  finasteride (PROSCAR) tablet 5 mg (has no administration in time range)  tamsulosin (FLOMAX) capsule 0.4 mg (has no administration in time range)  apixaban (ELIQUIS) tablet 2.5 mg (has no administration in time range)  cholecalciferol (VITAMIN D3) 25 MCG (1000 UNIT) tablet 1,000 Units (has no administration in time range)  albuterol (PROVENTIL) (2.5 MG/3ML) 0.083% nebulizer solution 3 mL (has  no administration in time range)  latanoprost (XALATAN) 0.005 % ophthalmic solution 1 drop (has no administration in time range)  timolol (TIMOPTIC) 0.5 % ophthalmic solution 1 drop (has no administration in time range)  0.9 %  sodium chloride infusion (has no administration in time range)  ondansetron (ZOFRAN) tablet 4 mg (has no administration in time range)    Or  ondansetron (ZOFRAN) injection 4 mg (has no administration in time range)  fluticasone furoate-vilanterol (BREO ELLIPTA) 100-25 MCG/ACT 1 puff (has no administration in time range)  umeclidinium bromide (INCRUSE ELLIPTA) 62.5 MCG/ACT 1 puff (has no administration in time range)  sodium chloride 0.9 % bolus 500 mL (0 mLs Intravenous Stopped 12/17/22 1328)    Mobility Unknown        R Recommendations: See Admitting Provider Note  Report given to:   Additional Notes: Pt came in due to his kidneys. His creatinine and BUN are both elevated. He has a 20 G in the L FA. He got a 500 NaCl earlier. Daughter is at bedside.

## 2022-12-18 DIAGNOSIS — N179 Acute kidney failure, unspecified: Secondary | ICD-10-CM | POA: Diagnosis not present

## 2022-12-18 LAB — CBC
HCT: 26.1 % — ABNORMAL LOW (ref 39.0–52.0)
Hemoglobin: 8.2 g/dL — ABNORMAL LOW (ref 13.0–17.0)
MCH: 30.6 pg (ref 26.0–34.0)
MCHC: 31.4 g/dL (ref 30.0–36.0)
MCV: 97.4 fL (ref 80.0–100.0)
Platelets: 311 10*3/uL (ref 150–400)
RBC: 2.68 MIL/uL — ABNORMAL LOW (ref 4.22–5.81)
RDW: 14.5 % (ref 11.5–15.5)
WBC: 6.9 10*3/uL (ref 4.0–10.5)
nRBC: 0 % (ref 0.0–0.2)

## 2022-12-18 LAB — COMPREHENSIVE METABOLIC PANEL
ALT: 11 U/L (ref 0–44)
AST: 12 U/L — ABNORMAL LOW (ref 15–41)
Albumin: 2.2 g/dL — ABNORMAL LOW (ref 3.5–5.0)
Alkaline Phosphatase: 65 U/L (ref 38–126)
Anion gap: 6 (ref 5–15)
BUN: 26 mg/dL — ABNORMAL HIGH (ref 8–23)
CO2: 18 mmol/L — ABNORMAL LOW (ref 22–32)
Calcium: 8 mg/dL — ABNORMAL LOW (ref 8.9–10.3)
Chloride: 116 mmol/L — ABNORMAL HIGH (ref 98–111)
Creatinine, Ser: 2.94 mg/dL — ABNORMAL HIGH (ref 0.61–1.24)
GFR, Estimated: 19 mL/min — ABNORMAL LOW (ref >60)
Glucose, Bld: 98 mg/dL (ref 70–99)
Potassium: 4.5 mmol/L (ref 3.5–5.1)
Sodium: 140 mmol/L (ref 135–145)
Total Bilirubin: 0.4 mg/dL (ref 0.3–1.2)
Total Protein: 4.8 g/dL — ABNORMAL LOW (ref 6.5–8.1)

## 2022-12-18 LAB — URINE CULTURE: Culture: NO GROWTH

## 2022-12-18 MED ORDER — DOCUSATE SODIUM 100 MG PO CAPS: 100.0000 mg | ORAL_CAPSULE | Freq: Every evening | ORAL | 0 refills | Status: DC

## 2022-12-18 MED ORDER — GABAPENTIN 300 MG PO CAPS
300.0000 mg | ORAL_CAPSULE | Freq: Every day | ORAL | 0 refills | Status: DC
Start: 2022-12-18 — End: 2022-12-19

## 2022-12-18 MED ORDER — CHLORHEXIDINE GLUCONATE CLOTH 2 % EX PADS
6.0000 | MEDICATED_PAD | Freq: Every day | CUTANEOUS | Status: DC
  Administered 2022-12-18 – 2022-12-19 (×2): 6 via TOPICAL

## 2022-12-18 MED ORDER — VITAMIN D3 25 MCG PO TABS: 1000.0000 [IU] | ORAL_TABLET | Freq: Every evening | ORAL | 0 refills | Status: AC

## 2022-12-18 MED ORDER — LATANOPROST 0.005 % OP SOLN: 1.0000 [drp] | Freq: Every day | OPHTHALMIC | 0 refills | Status: AC

## 2022-12-18 NOTE — TOC CM/SW Note (Signed)
Transition of Care [facility]) - Inpatient Brief Assessment   Patient Details  Name: Charles Golden MRN: [id number] Date of Birth: February 03, 1926  [facility]) CM/SW Contact:    Tom-Johnson, Hershal Coria, RN Phone Number: 12/18/2022, 11:23 AM   Clinical Narrative:  Patient presented to the ED from a PCP referral with abnormal labs/Renal functions. Found have AKI on CKDIIIb d/t Bladder Outlet Obstruction. Foley Catheter inserted and Medical workup continues.   From home alone, has three supportive children. Daughter, [name] lives across from patient, Victorino Dike lives in [place], [place] and [name] lives locally and assist patient with needs. Has a walker and shower seat at home. Has Meals on Wheels services.  PCP is [name], DO and uses [employer].   [facility] called [facility] and notification # is [phone].  No TOC recommendations noted at this time. CM will continue to follow as patient progresses with care towards discharge.      Transition of Care Asessment: Insurance and Status: Insurance coverage has been reviewed Patient has primary care physician: Yes Home environment has been reviewed: Yes Prior level of function:: Modified independence- Engineer, maintenance Home Services: No current home services Social Determinants of Health Reivew: SDOH reviewed no interventions necessary Readmission risk has been reviewed: Yes Transition of care needs: transition of care needs identified, TOC will continue to follow

## 2022-12-18 NOTE — Evaluation (Signed)
Occupational Therapy Evaluation Patient Details Name: Charles Golden MRN: [id number] DOB: Feb 01, 1926 Today's Date: 12/18/2022   History of Present Illness 89yo male referred to the ED by his PCP on 12/17/22 due to abnormal renal function on labs. Found to have AKI on CKD in context of bladder outlet obstruction. PMH Afib with RVR, COPD, CVA, hernia s/p repair, HOH, IBS, CKD, small bowel resection, knee scope   Clinical Impression   Pt s/p above diagnosis. Pt doing well, in good spirits, feeling better overall, had some nausea prior to session. Pt lives alone, daughters help as needed with IADLs, sometimes uses RW around house. Pt likely at baseline function, supervision to mod I for ADLs and mobility, good overall strength, balance, safety awareness, no further OT or follow up needed. Pt and daughter educated on safety awareness and all needs addressed during session, Pt and daughter feel that they have all support and DME at home needed to remain safe and independent     Recommendations for follow up therapy are one component of a multi-disciplinary discharge planning process, led by the attending physician.  Recommendations may be updated based on patient status, additional functional criteria and insurance authorization.   Assistance Recommended at Discharge Set up Supervision/Assistance  Patient can return home with the following A little help with bathing/dressing/bathroom;Assistance with cooking/housework;Assist for transportation;Help with stairs or ramp for entrance    Functional Status Assessment  Patient has had a recent decline in their functional status and demonstrates the ability to make significant improvements in function in a reasonable and predictable amount of time.  Equipment Recommendations  None recommended by OT    Recommendations for Other Services       Precautions / Restrictions Precautions Precautions: Fall Restrictions Weight Bearing Restrictions: No       Mobility Bed Mobility Overal bed mobility: Modified Independent                  Transfers Overall transfer level: Needs assistance Equipment used: Rolling walker (2 wheels) Transfers: Sit to/from Stand, Bed to chair/wheelchair/BSC Sit to Stand: Supervision           General transfer comment: supervision      Balance Overall balance assessment: Mild deficits observed, not formally tested                                         ADL either performed or assessed with clinical judgement   ADL Overall ADL's : Needs assistance/impaired Eating/Feeding: Independent   Grooming: Supervision/safety;Standing   Upper Body Bathing: Sitting;Supervision/ safety   Lower Body Bathing: Supervison/ safety   Upper Body Dressing : Supervision/safety;Set up   Lower Body Dressing: Supervision/safety   Toilet Transfer: Supervision/safety   Toileting- Clothing Manipulation and Hygiene: Modified independent       Functional mobility during ADLs: Supervision/safety General ADL Comments: supervision for mobility, good overall strength/balance and activity tolerance. Pt close to baseline     Vision Baseline Vision/History: 1 Wears glasses Ability to See in Adequate Light: 0 Adequate Patient Visual Report: No change from baseline       Perception     Praxis      Pertinent Vitals/Pain Pain Assessment Pain Assessment: No/denies pain     Hand Dominance     Extremity/Trunk Assessment Upper Extremity Assessment Upper Extremity Assessment: Overall WFL for tasks assessed   Lower Extremity Assessment Lower Extremity Assessment: Defer to  PT evaluation   Cervical / Trunk Assessment Cervical / Trunk Assessment: Normal   Communication Communication Communication: HOH   Cognition Arousal/Alertness: Awake/alert Behavior During Therapy: WFL for tasks assessed/performed Overall Cognitive Status: Within Functional Limits for tasks assessed                                        General Comments       Exercises     Shoulder Instructions      Home Living Family/patient expects to be discharged to:: Private residence Living Arrangements: Alone Available Help at Discharge: Family;Available 24 hours/day Type of Home: House Home Access: Stairs to enter [employer] of Steps: 2 Entrance Stairs-Rails: Can reach both Home Layout: One level     Bathroom Shower/Tub: Producer, television/film/video: Handicapped height Bathroom Accessibility: Yes How Accessible: Accessible via walker Home Equipment: Rolling Walker (2 wheels);Shower seat;Grab bars - toilet;Grab bars - tub/shower   Additional Comments: lives alone, three daughter assists as needed, one lives across street      Prior Functioning/Environment Prior Level of Function : Independent/Modified Independent             Mobility Comments: fall in June 2024, will have MRI outpatient later on ADLs Comments: some assistance for meals/cleaning        OT Problem List: Decreased strength;Decreased activity tolerance      OT Treatment/Interventions:      OT Goals(Current goals can be found in the care plan section) Acute Rehab OT Goals Patient Stated Goal: to return home OT Goal Formulation: With patient/family Time For Goal Achievement: 01/01/23 Potential to Achieve Goals: Good  OT Frequency:      Co-evaluation              AM-PAC OT "6 Clicks" Daily Activity     Outcome Measure Help from another person eating meals?: None Help from another person taking care of personal grooming?: A Little Help from another person toileting, which includes using toliet, bedpan, or urinal?: A Little Help from another person bathing (including washing, rinsing, drying)?: A Little Help from another person to put on and taking off regular upper body clothing?: A Little Help from another person to put on and taking off regular lower body clothing?: A  Little 6 Click Score: 19   End of Session Equipment Utilized During Treatment: Rolling walker (2 wheels) Nurse Communication: Mobility status  Activity Tolerance: Patient tolerated treatment well Patient left: in bed;with call bell/phone within reach;with family/visitor present  OT Visit Diagnosis: Muscle weakness (generalized) (M62.81);History of falling (Z91.81)                Time: 1340-1356 OT Time Calculation (min): 16 min Charges:  OT General Charges $OT Visit: 1 Visit OT Evaluation $OT Eval Low Complexity: 1 Low  [address] OTR/L   Alexis Goodell 12/18/2022, 2:03 PM

## 2022-12-18 NOTE — Progress Notes (Signed)
PROGRESS NOTE    Charles Golden  IHK:[id number] DOB: [birth year] DOA: 12/17/2022 PCP: [name], DO   Brief Narrative:  Charles Golden is a 89 y.o. male with medical history significant of bronchiectasis, COPD  Atrial fibrillation on eliquis,  right knee pain , GERD, BPH, HOH, CKDIIIb, Glaucoma, hx of CVA  Who presents to ED in referral from pcp after labs completed over the weekend returned abnormal renal function - patient had notable AKI on CKD3b -imaging notable for enlarged bladder and bilateral hydronephrosis with concern for bladder outlet obstruction.  Foley placed in ED with nearly 4 L of urine removed.  Hospitalist called for admission, urology contacted at admission.   Assessment & Plan:   Principal Problem:   AKI (acute kidney injury) (HCC)   AKI on CKD3b, in setting of bladder out let obstruction  -Case discussed with urology recommending Foley catheter, outpatient follow-up with Dr. Wilson Singer primary urologist as an outpatient as scheduled -Creatinine continues to downtrend, not yet back to baseline continue IV fluids increase p.o. intake as appropriate -Foley continues to have high-volume output, some blood-tinged urine this afternoon with concern for clot, will follow overnight if no obstruction or need for flushing will likely discharge next 24 to 48 hours for outpatient evaluation -Presumed prostatic enlargement as etiology for obstruction, chronic  Volume overload, lower extremity edema bilaterally -Likely secondary to urinary obstruction/CKD -Echo pending to rule out central cardiac etiology  COPD, not in acute exacerbation Bronchiectasis  -continue trelegy  -pulmonary toilet    Atrial fibrillation  -Rate controlled, continue on on eliquis   Ambulatory dysfunction  -s/p recent injury with fall to his left knee -resolving ecchymosis noted -has mri planned as outpatient  -PT evaluating recommending outpatient physical therapy in the interim   GERD -ppi    BPH -will need f/u with dr Wilson Singer on discharge    Hx of CVA -on ppx with Eliquis   DVT prophylaxis: apixaban (ELIQUIS) tablet 2.5 mg Start: 12/17/22 2200 apixaban (ELIQUIS) tablet 2.5 mg   Code Status:   Code Status: Full Code  Family Communication: Daughter at bedside  Status is: Inpatient  Dispo: The patient is from: Home              Anticipated d/c is to: Home              Anticipated d/c date is: 24 to 48 hours              Patient currently not medically stable for discharge  Consultants:  Urology sidelined  Procedures:  Foley placement, [address] [place] 12/17/2022  Antimicrobials:  None indicated  Subjective: No acute issues or events overnight, abdominal pain distention and tenderness appear to be resolving rapidly  Objective: Vitals:   12/17/22 2352 12/18/22 0514 12/18/22 0836 12/18/22 0852  BP: (!) 144/79 (!) 140/70  (!) 140/88  Pulse: 71 77 70 (!) 45  Resp: 17 17 18 18   Temp: 98.5 F (36.9 C) (!) 97.5 F (36.4 C)  98 F (36.7 C)  TempSrc:      SpO2: 96% 97% 98% 98%  Weight:      Height:        Intake/Output Summary (Last 24 hours) at 12/18/2022 1448 Last data filed at 12/18/2022 1429 Gross per 24 hour  Intake 521.67 ml  Output 6475 ml  Net -5953.33 ml   Filed Weights   12/17/22 1738  Weight: 73.8 kg    Examination:  General: Pleasant elderly gentleman resting comfortably  in bed, difficulty communicating due to hearing loss and patient, daughter at bedside assisting.  No acute distress HEENT:  Normocephalic atraumatic.  Sclerae nonicteric, noninjected.  Extraocular movements intact bilaterally. Neck:  Without mass or deformity.  Trachea is midline. Lungs:  Clear to auscultate bilaterally without rhonchi, wheeze, or rales. Heart:  Regular rate and rhythm.  Without murmurs, rubs, or gallops. Abdomen:  Soft, nondistended, minimally tender suprapubic Extremities: Without cyanosis, clubbing, skin to 1+ pitting edema bilateral lower extremities Skin:   Warm and dry, no erythema; circumferential resolving ecchymosis around left knee/patella   LOS: 1 day   Time spent:  Azucena Fallen, DO [facility]  If 7PM-7AM, please contact night-coverage [email or web address]  12/18/2022, 2:48 PM

## 2022-12-18 NOTE — Progress Notes (Signed)
Foley care education provided to patient and pt. Daughter Charles Golden including proper emptying of Foley, Signs and Symptoms of infection and when to call MD, daughter Charles Golden return demonstrated proper emptying of Foley  [name]

## 2022-12-18 NOTE — Evaluation (Signed)
Physical Therapy Evaluation Patient Details Name: Charles Golden MRN: [id number] DOB: 09/08/1925 Today's Date: 12/18/2022  History of Present Illness  89yo male referred to the ED by his PCP on 12/17/22 due to abnormal renal function on labs. Found to have AKI on CKD in context of bladder outlet obstruction. PMH Afib with RVR, COPD, CVA, hernia s/p repair, HOH, IBS, CKD, small bowel resection, knee scope  Clinical Impression   Pt received in bed, motivated to mobilize with PT and get into the bathroom. Able to walk short distances without device but furniture surfs to unsafe extent, educated and encouraged use of RW for safety/fall prevention. Tolerated activity well overall today, left in bed with all needs met and family present, bed alarm active. Seems a bit weaker than his baseline and may benefit from appropriate post-acute skilled rehab follow up.         Assistance Recommended at Discharge Intermittent Supervision/Assistance  If plan is discharge home, recommend the following:  Can travel by private vehicle  Assist for transportation;Assistance with cooking/housework        Equipment Recommendations None recommended by PT  Recommendations for Other Services       Functional Status Assessment Patient has had a recent decline in their functional status and demonstrates the ability to make significant improvements in function in a reasonable and predictable amount of time.     Precautions / Restrictions Precautions Precautions: Fall Restrictions Weight Bearing Restrictions: No      Mobility  Bed Mobility Overal bed mobility: Modified Independent             General bed mobility comments: HOB elevated, cues for safety with foley management    Transfers Overall transfer level: Needs assistance Equipment used: None               General transfer comment: Min guard with no device, S with RW but did need cues for hand placement     Ambulation/Gait Ambulation/Gait assistance: Supervision Gait Distance (Feet): 80 Feet Assistive device: Rolling walker (2 wheels) Gait Pattern/deviations: Step-through pattern, Decreased step length - right, Decreased step length - left, Knee flexed in stance - left, Knee flexed in stance - right, Trunk flexed Gait velocity: decreased     General Gait Details: slow but steady with RW, trunk flexed and needed cues for upright posture but no extra assist for balance  Stairs            Wheelchair Mobility     Tilt Bed    Modified Rankin (Stroke Patients Only)       Balance Overall balance assessment: Mild deficits observed, not formally tested, History of Falls                                           Pertinent Vitals/Pain Pain Assessment Pain Assessment: No/denies pain    Home Living Family/patient expects to be discharged to:: Private residence Living Arrangements: Alone Available Help at Discharge: Family;Available 24 hours/day Type of Home: House Home Access: Stairs to enter Entrance Stairs-Rails: Can reach both Entrance Stairs-Number of Steps: 2   Home Layout: One level Home Equipment: Agricultural consultant (2 wheels);Shower seat;Grab bars - toilet;Grab bars - tub/shower      Prior Function Prior Level of Function : Independent/Modified Independent             Mobility Comments: fall in June 2024, will  have MRI outpatient later on       Hand Dominance        Extremity/Trunk Assessment   Upper Extremity Assessment Upper Extremity Assessment: Overall WFL for tasks assessed    Lower Extremity Assessment Lower Extremity Assessment: Generalized weakness    Cervical / Trunk Assessment Cervical / Trunk Assessment: Normal  Communication   Communication: HOH  Cognition Arousal/Alertness: Awake/alert Behavior During Therapy: WFL for tasks assessed/performed Overall Cognitive Status: Within Functional Limits for tasks assessed                                           General Comments      Exercises     Assessment/Plan    PT Assessment Patient needs continued PT services  PT Problem List Decreased strength;Decreased balance;Decreased mobility;Decreased activity tolerance       PT Treatment Interventions DME instruction;Functional mobility training;Balance training;Patient/family education;Gait training;Therapeutic activities;Stair training;Therapeutic exercise    PT Goals (Current goals can be found in the Care Plan section)  Acute Rehab PT Goals Patient Stated Goal: go home PT Goal Formulation: With patient Time For Goal Achievement: 12/18/22 Potential to Achieve Goals: Good    Frequency Min 1X/week     Co-evaluation               AM-PAC PT "6 Clicks" Mobility  Outcome Measure Help needed turning from your back to your side while in a flat bed without using bedrails?: None Help needed moving from lying on your back to sitting on the side of a flat bed without using bedrails?: None Help needed moving to and from a bed to a chair (including a wheelchair)?: A Little Help needed standing up from a chair using your arms (e.g., wheelchair or bedside chair)?: A Little Help needed to walk in hospital room?: A Little Help needed climbing 3-5 steps with a railing? : A Little 6 Click Score: 20    End of Session   Activity Tolerance: Patient tolerated treatment well Patient left: in bed;with call bell/phone within reach;with bed alarm set;with family/visitor present Nurse Communication: Mobility status PT Visit Diagnosis: Muscle weakness (generalized) (M62.81);Unsteadiness on feet (R26.81);History of falling (Z91.81)    Time: 1212-1232 PT Time Calculation (min) (ACUTE ONLY): 20 min   Charges:   PT Evaluation $PT Eval Low Complexity: 1 Low   PT General Charges $$ ACUTE PT VISIT: 1 Visit        Nedra Hai, PT, DPT 12/18/22 12:47 PM

## 2022-12-19 DIAGNOSIS — N179 Acute kidney failure, unspecified: Secondary | ICD-10-CM | POA: Diagnosis not present

## 2022-12-19 LAB — BASIC METABOLIC PANEL
Anion gap: 12 (ref 5–15)
BUN: 18 mg/dL (ref 8–23)
CO2: 20 mmol/L — ABNORMAL LOW (ref 22–32)
Calcium: 8.6 mg/dL — ABNORMAL LOW (ref 8.9–10.3)
Chloride: 108 mmol/L (ref 98–111)
Creatinine, Ser: 2.05 mg/dL — ABNORMAL HIGH (ref 0.61–1.24)
GFR, Estimated: 29 mL/min — ABNORMAL LOW (ref >60)
Glucose, Bld: 171 mg/dL — ABNORMAL HIGH (ref 70–99)
Potassium: 4.1 mmol/L (ref 3.5–5.1)
Sodium: 140 mmol/L (ref 135–145)

## 2022-12-19 LAB — CBC
HCT: 28.5 % — ABNORMAL LOW (ref 39.0–52.0)
Hemoglobin: 9.2 g/dL — ABNORMAL LOW (ref 13.0–17.0)
MCH: 31.4 pg (ref 26.0–34.0)
MCHC: 32.3 g/dL (ref 30.0–36.0)
MCV: 97.3 fL (ref 80.0–100.0)
Platelets: 319 10*3/uL (ref 150–400)
RBC: 2.93 MIL/uL — ABNORMAL LOW (ref 4.22–5.81)
RDW: 14.3 % (ref 11.5–15.5)
WBC: 7 10*3/uL (ref 4.0–10.5)
nRBC: 0 % (ref 0.0–0.2)

## 2022-12-19 NOTE — Discharge Summary (Signed)
Physician Discharge Summary  UTAH DELAUDER JXB:[id number] DOB: 1925/07/08 DOA: 12/17/2022  PCP: [name], DO  Admit date: 12/17/2022 Discharge date: 12/19/2022  Admitted From: Home Disposition: Home  Recommendations for Outpatient Follow-up:  Follow up with PCP in 1-2 weeks Follow-up with urology Dr. Annabell Howells as scheduled, continue Foley until that time  Home Health: Home health PT Equipment/Devices: None  Discharge Condition: Stable CODE STATUS: Full Diet recommendation: Low-salt low-fat low-carb diet  Brief/Interim Summary: Charles Golden is a 89 y.o. male with medical history significant of bronchiectasis, COPD  Atrial fibrillation on eliquis,  right knee pain , GERD, BPH, HOH, CKDIIIb, Glaucoma, hx of CVA  Who presents to ED in referral from pcp after labs completed over the weekend returned abnormal renal function - patient had notable AKI on CKD3b -imaging notable for enlarged bladder and bilateral hydronephrosis with concern for bladder outlet obstruction.  Foley placed in ED with nearly 4 L of urine removed.  Hospitalist called for admission, urology contacted at admission.  Patient admitted with profound urinary obstruction with bilateral hydronephrosis, AKI on CKD 3B with marked improvement with Foley catheter placement.  At this time his labs continue to downtrend appropriately, nearly back to baseline.  Patient has plans to follow with Dr. Annabell Howells his established urologist in the outpatient setting for further evaluation and treatment for this longstanding problem.  He is otherwise stable and agreeable for discharge home.  Given patient's new requirement for Foley and increased fall risk PT was evaluated who recommended home health PT as well as education given to family as well as patient about Foley catheter care and management.  Patient's other chronic comorbid conditions as below remain at baseline without exacerbation.  Discharge Diagnoses:  Principal Problem:   AKI  (acute kidney injury) (HCC)   AKI on CKD3b, in setting of bladder outlet obstruction, resolved   Volume overload, lower extremity edema bilaterally, resolved   COPD, not in acute exacerbation Bronchiectasis   Atrial fibrillation    Ambulatory dysfunction, acute on chronic   GERD   BPH   Hx of CVA   Discharge Instructions  Discharge Instructions     Ambulatory referral to Physical Therapy   Complete by: As directed    Face-to-face encounter (required for Medicare/Medicaid patients)   Complete by: As directed    The encounter with the patient was in whole, or in part, for the following medical condition, which is the primary reason for home health care: ambulatory dysfunction   I certify that, based on my findings, the following services are medically necessary home health services: Physical therapy   Reason for Medically Necessary Home Health Services:  Skilled Nursing- Change/Decline in Patient Status Therapy- Investment banker, operational, Transfer Training and Stair Training     My clinical findings support the need for the above services:  Unable to leave home safely without assistance and/or assistive device Unsafe ambulation due to balance issues     Further, I certify that my clinical findings support that this patient is homebound due to: Ambulates short distances less than 300 feet   Home Health   Complete by: As directed    To provide the following care/treatments:  PT OT        Allergies as of 12/19/2022       Reactions   Demerol [meperidine] Other (See Comments)   Pt. States "he woke up during a colonoscopy"   Spiriva Handihaler [tiotropium Bromide Monohydrate] Other (See Comments)   Mild Urinary Retention  Medication List     STOP taking these medications    furosemide 20 MG tablet Commonly known as: LASIX   guaiFENesin 600 MG 12 hr tablet Commonly known as: MUCINEX       TAKE these medications    acetaminophen 500 MG tablet Commonly known  as: TYLENOL Take 1,000 mg by mouth daily as needed for headache or moderate pain.   aspirin EC 81 MG tablet Take 1 tablet (81 mg total) by mouth daily. Swallow whole.   atorvastatin 40 MG tablet Commonly known as: LIPITOR Take 1 tablet (40 mg total) by mouth daily.   azelastine 0.1 % nasal spray Commonly known as: ASTELIN PLACE 1 SPRAY IN EACH NOSTRIL ONCE A DAY AS DIRECTED What changed:  how much to take how to take this when to take this additional instructions   CRANBERRY PO Take 1 tablet by mouth every evening.   diclofenac Sodium 1 % Gel Commonly known as: Voltaren Apply 2 g topically 4 (four) times daily as needed (arthritis).   docusate sodium 100 MG capsule Commonly known as: COLACE Take 1 capsule (100 mg total) by mouth every evening. What changed:  when to take this Another medication with the same name was removed. Continue taking this medication, and follow the directions you see here.   Eliquis 2.5 MG Tabs tablet Generic drug: apixaban Take 1 tablet (2.5 mg total) by mouth 2 (two) times daily.   finasteride 5 MG tablet Commonly known as: PROSCAR Take 5 mg by mouth daily.   fluticasone 50 MCG/ACT nasal spray Commonly known as: FLONASE Place 2 sprays into both nostrils daily. What changed: when to take this   gabapentin 300 MG capsule Commonly known as: NEURONTIN Take 300 mg by mouth at bedtime. What changed: Another medication with the same name was removed. Continue taking this medication, and follow the directions you see here.   latanoprost 0.005 % ophthalmic solution Commonly known as: XALATAN Place 1 drop into both eyes at bedtime. What changed:  how to take this Another medication with the same name was removed. Continue taking this medication, and follow the directions you see here.   lubiprostone 24 MCG capsule Commonly known as: AMITIZA Take 1 capsule (24 mcg total) by mouth 2 (two) times daily with a meal. For constipation    oxyCODONE-acetaminophen 5-325 MG tablet Commonly known as: PERCOCET/ROXICET Take 0.5-1 tablets by mouth every 8 (eight) hours as needed for severe pain (USE ONLY IF NEEDED).   silodosin 8 MG Caps capsule Commonly known as: RAPAFLO Take 8 mg by mouth daily.   timolol 0.5 % ophthalmic solution Commonly known as: TIMOPTIC Place 1 drop into the left eye every morning.   Trelegy Ellipta 100-62.5-25 MCG/ACT Aepb Generic drug: Fluticasone-Umeclidin-Vilant Inhale 1 puff into the lungs every evening. What changed: when to take this   Ventolin HFA 108 (90 Base) MCG/ACT inhaler Generic drug: albuterol INHALE 2 PUFFS EVERY 6 HOURS AS NEEDED FOR WHEEZING OR SHORTNESS OF BREATH   VITAMIN B-12 PO Take 1 tablet by mouth every evening.   vitamin D3 25 MCG tablet Commonly known as: CHOLECALCIFEROL Take 1 tablet (1,000 Units total) by mouth every evening. What changed:  medication strength how much to take        Follow-up Information     Bjorn Pippin, MD Follow up in 1 week(s).   Specialty: Urology Why: Call to schedule follow-up appointment Contact information: [address] [place] [address] [phone]  [facility] Follow up.   Specialty: Rehabilitation Why: Call to schedule to first appointment Contact information: [address] [place] [address] [phone]               Allergies  Allergen Reactions   Demerol [Meperidine] Other (See Comments)    Pt. States "he woke up during a colonoscopy"   Spiriva Handihaler [Tiotropium Bromide Monohydrate] Other (See Comments)    Mild Urinary Retention    Consultations: Urology sideline  Procedures/Studies: CT Renal Stone Study  Result Date: 12/17/2022 CLINICAL DATA:  Abdominal/flank pain, stone suspected EXAM: CT ABDOMEN AND PELVIS WITHOUT CONTRAST TECHNIQUE: Multidetector CT imaging of the abdomen and pelvis was performed following the standard  protocol without IV contrast. RADIATION DOSE REDUCTION: This exam was performed according to the departmental dose-optimization program which includes automated exposure control, adjustment of the mA and/or kV according to patient size and/or use of iterative reconstruction technique. COMPARISON:  CT angiography chest abdomen and pelvis from 10/22/2022. FINDINGS: Lower chest: There are patchy atelectatic changes in the visualized lung bases. No overt consolidation. No pleural effusion. The heart is normal in size. No pericardial effusion. Hepatobiliary: The liver is normal in size. Non-cirrhotic configuration. No suspicious mass. No intrahepatic or extrahepatic bile duct dilation. No calcified gallstones. Normal gallbladder wall thickness. No pericholecystic inflammatory changes. Pancreas: Unremarkable. No pancreatic ductal dilatation or surrounding inflammatory changes. Spleen: Normal in size. There are multiple sub 5 mm calcified granulomas in the spleen. No other focal lesion. Adrenals/Urinary Tract: Adrenal glands are unremarkable. No suspicious renal mass. There is a 1.9 x 2.5 cm simple cyst arising from the left kidney upper pole, medially. Moderate right hydronephrosis noted. There is a nonobstructing calculus in the right upper ureter measuring 4 x 4 mm. There are additional at least 3 sub 5 mm calculi in the right kidney. No obstructing mass. Moderate left hydronephrosis noted. Left ureter is dilated. No obstructing mass or ureteric calculi. There are at least 3, sub 5 mm calculi in the left kidney. Urinary bladder is markedly dilated reaching above the superior iliac crest. No abnormal wall thickening or perivesical fat stranding. No focal mass or calculi. Stomach/Bowel: No disproportionate dilation of the small or large bowel loops. No evidence of abnormal bowel wall thickening or inflammatory changes. The appendix was not visualized; however there is no acute inflammatory process in the right lower  quadrant. Enteroenteric anastomosis noted in the left upper abdomen, anteriorly. Vascular/Lymphatic: No ascites or pneumoperitoneum. No abdominal or pelvic lymphadenopathy, by size criteria. No aneurysmal dilation of the major abdominal arteries. There are marked peripheral atherosclerotic vascular calcifications of the aorta and its major branches. Reproductive: Markedly enlarged prostate gland with median lobe projecting into the bladder base. Other: The visualized soft tissues and abdominal wall are unremarkable. Musculoskeletal: No suspicious osseous lesions. There are mild - moderate multilevel degenerative changes in the visualized spine. IMPRESSION: 1. Moderate bilateral hydronephrosis and hydroureter. No obstructing mass or ureteric calculi identified. Markedly dilated urinary bladder reaching above the superior iliac crest. Markedly enlarged prostate gland with median lobe projecting into the bladder base. Findings are suggestive of bladder outlet obstruction. 2. There is a 4 x 4 mm nonobstructing calculus in the right upper ureter. Additional bilateral sub 5 mm renal calculi. 3. Multiple other nonacute observations, as described above. Aortic Atherosclerosis (ICD10-I70.0). Electronically Signed   By: Jules Schick M.D.   On: 12/17/2022 11:56   DG Chest 2 View  Result Date: 12/14/2022 CLINICAL  DATA:  peripheral edema, weight gain EXAM: CHEST - 2 VIEW COMPARISON:  11/11/2022. FINDINGS: Bilateral lung fields are clear. Bilateral costophrenic angles are clear. Normal cardio-mediastinal silhouette. No acute osseous abnormalities. The soft tissues are within normal limits. IMPRESSION: No active cardiopulmonary disease. Electronically Signed   By: Jules Schick M.D.   On: 12/14/2022 10:13   DG Abd 2 Views  Result Date: 11/29/2022 CLINICAL DATA:  Bloated abdomen EXAM: ABDOMEN - 2 VIEW COMPARISON:  Abdominal CT 10/22/2022 FINDINGS: The bowel gas pattern is normal. There is no evidence of free air. Moderate  stool in the colon especially the ascending and descending segments. No concerning mass effect or calcification. Lung bases are clear. Lumbar spine degeneration and scoliosis. Extensive atherosclerosis and pelvic phleboliths. IMPRESSION: Normal bowel gas pattern with moderate stool retention. Electronically Signed   By: Tiburcio Pea M.D.   On: 11/29/2022 07:34     Subjective: No acute issues or events overnight   Discharge Exam: Vitals:   12/19/22 0604 12/19/22 0851  BP: 116/67 (!) 108/55  Pulse: 64 71  Resp: 16 18  Temp: 98.2 F (36.8 C) 98.9 F (37.2 C)  SpO2: 96% 97%   Vitals:   12/18/22 1622 12/18/22 2012 12/19/22 0604 12/19/22 0851  BP: (!) 174/87 130/85 116/67 (!) 108/55  Pulse: 67 75 64 71  Resp: 18 17 16 18   Temp: 98.2 F (36.8 C) 99.6 F (37.6 C) 98.2 F (36.8 C) 98.9 F (37.2 C)  TempSrc:   Oral Oral  SpO2: 96% 97% 96% 97%  Weight:      Height:        General: Pt is alert, awake, not in acute distress Cardiovascular: Irregularly irregular Respiratory: CTA bilaterally, no wheezing, no rhonchi Abdominal: Soft, NT, ND, Foley catheter draining clear yellow urine Extremities: no edema, no cyanosis    The results of significant diagnostics from this hospitalization (including imaging, microbiology, ancillary and laboratory) are listed below for reference.     Microbiology: Recent Results (from the past 240 hour(s))  Urine Culture     Status: None   Collection Time: 12/17/22  6:54 PM   Specimen: Urine, Clean Catch  Result Value Ref Range Status   Specimen Description URINE, CLEAN CATCH  Final   Special Requests NONE  Final   Culture   Final    NO GROWTH Performed at [facility], [address] [place], [place] [address]    Report Status 12/18/2022 FINAL  Final     Labs:  Basic Metabolic Panel: Recent Labs  Lab 12/14/22 1011 12/17/22 0921 12/17/22 1047 12/17/22 1748 12/18/22 0134 12/19/22 0807  NA 143 140  --  138 140 140  K 5.9* 4.9   --  4.8 4.5 4.1  CL 111* 113*  --  111 116* 108  CO2 16* 18*  --  19* 18* 20*  GLUCOSE 88 117*  --  122* 98 171*  BUN 45* 36*  --  33* 26* 18  CREATININE 4.76* 4.56*  --  3.86* 2.94* 2.05*  CALCIUM 8.5* 8.6*  --  8.2* 8.0* 8.6*  MG  --   --  1.7 1.7  --   --   PHOS  --   --   --  4.3  --   --    Liver Function Tests: Recent Labs  Lab 12/14/22 1011 12/17/22 1748 12/18/22 0134  AST 14 17 12*  ALT 13 14 11   ALKPHOS 96 76 65  BILITOT 0.2 0.4 0.4  PROT 5.7* 5.4* 4.8*  ALBUMIN 3.2* 2.5* 2.2*   CBC: Recent Labs  Lab 12/14/22 1011 12/17/22 0921 12/18/22 0134 12/19/22 0807  WBC 7.8 7.1 6.9 7.0  NEUTROABS 5.5  --   --   --   HGB 9.2* 10.0* 8.2* 9.2*  HCT 28.5* 32.5* 26.1* 28.5*  MCV 94 103.2* 97.4 97.3  PLT 339 364 311 319   Urinalysis    Component Value Date/Time   COLORURINE YELLOW 12/17/2022 0953   APPEARANCEUR HAZY (A) 12/17/2022 0953   APPEARANCEUR Clear 10/25/2021 0956   LABSPEC 1.009 12/17/2022 0953   PHURINE 5.0 12/17/2022 0953   GLUCOSEU NEGATIVE 12/17/2022 0953   HGBUR MODERATE (A) 12/17/2022 0953   BILIRUBINUR NEGATIVE 12/17/2022 0953   BILIRUBINUR Negative 10/25/2021 0956   KETONESUR NEGATIVE 12/17/2022 0953   PROTEINUR NEGATIVE 12/17/2022 0953   UROBILINOGEN 0.2 09/04/2012 1334   NITRITE NEGATIVE 12/17/2022 0953   LEUKOCYTESUR TRACE (A) 12/17/2022 0953   Sepsis Labs Recent Labs  Lab 12/14/22 1011 12/17/22 0921 12/18/22 0134 12/19/22 0807  WBC 7.8 7.1 6.9 7.0   Microbiology Recent Results (from the past 240 hour(s))  Urine Culture     Status: None   Collection Time: 12/17/22  6:54 PM   Specimen: Urine, Clean Catch  Result Value Ref Range Status   Specimen Description URINE, CLEAN CATCH  Final   Special Requests NONE  Final   Culture   Final    NO GROWTH Performed at [facility], [address] [place], [place] [address]    Report Status 12/18/2022 FINAL  Final     Time coordinating discharge: Over 30  minutes  SIGNED:   Azucena Fallen, DO [facility] 12/19/2022, 2:24 PM Pager   If 7PM-7AM, please contact night-coverage [email or web address]

## 2022-12-19 NOTE — TOC Transition Note (Addendum)
Transition of Care [facility]) - CM/SW Discharge Note   Patient Details  Name: Charles Golden MRN: [id number] Date of Birth: 1925-07-11  Transition of Care [facility]) CM/SW Contact:  Tom-Johnson, Hershal Coria, RN Phone Number: 12/19/2022, 1:24 PM   Clinical Narrative:     Patient is scheduled for discharge today.  Readmission Risk Assessment done. Home health info, Outpatient referral, hospital f/u and discharge instructions on AVS. Daughter, Rayfield Citizen to transport at discharge.  No further TOC needs noted.       Final next level of care: OP Rehab Barriers to Discharge: Barriers Resolved   Patient Goals and CMS Choice CMS Medicare.gov Compare Post Acute Care list provided to:: Patient Choice offered to / list presented to : Patient, Adult Children Rayfield Citizen and Victorino Dike)  Discharge Placement                  Patient to be transferred to facility by: Daughter Name of family member notified: [facility] Additional resources added to the After Visit Summary for                  DME Arranged: N/A DME Agency: NA       HH Arranged: NA HH Agency: NA        Social Determinants of Health (SDOH) Interventions SDOH Screenings   Food Insecurity: No Food Insecurity (12/17/2022)  Housing: Low Risk  (12/17/2022)  Transportation Needs: No Transportation Needs (12/17/2022)  Utilities: Not At Risk (12/17/2022)  Depression (PHQ2-9): Low Risk  (12/14/2022)  Financial Resource Strain: Low Risk  (02/09/2020)  Physical Activity: Insufficiently Active (05/19/2021)  Social Connections: Socially Integrated (06/09/2019)  Stress: Stress Concern Present (07/17/2021)  Tobacco Use: Medium Risk (12/17/2022)     Readmission Risk Interventions    12/18/2022   11:22 AM  Readmission Risk Prevention Plan  Transportation Screening Complete  PCP or Specialist Appt within 3-5 Days Complete  Social Work Consult for Recovery Care Planning/Counseling Complete  Palliative  Care Screening Not Applicable  Medication Review Oceanographer) Referral to Pharmacy

## 2022-12-20 ENCOUNTER — Encounter: Payer: Self-pay | Admitting: Family Medicine

## 2022-12-20 ENCOUNTER — Telehealth: Payer: Medicare Other | Admitting: Family Medicine

## 2022-12-20 DIAGNOSIS — R110 Nausea: Secondary | ICD-10-CM

## 2022-12-20 MED ORDER — ONDANSETRON 4 MG PO TBDP
4.0000 mg | ORAL_TABLET | Freq: Three times a day (TID) | ORAL | 2 refills | Status: DC | PRN
Start: 2022-12-20 — End: 2023-01-16

## 2022-12-20 NOTE — Progress Notes (Signed)
Virtual Visit via MyChart video note  I connected with Charles Golden on 12/20/22 at 1523 by video and verified that I am speaking with the correct person using two identifiers. Charles Golden is currently located at home and  daughter Charles Golden  are currently with her during visit. The provider, Elige Radon Buffi Ewton, MD is located in their office at time of visit.  Call ended at 1536  I discussed the limitations, risks, security and privacy concerns of performing an evaluation and management service by video and the availability of in person appointments. I also discussed with the patient that there may be a patient responsible charge related to this service. The patient expressed understanding and agreed to proceed.   History and Present Illness: Patient was in hospital yesterday and has enlarged prostate and has catheter.  He has been nauseous for 1 week.  They have a visit with PCP.  They gave him something in hospital because of nausea. He is not drinking many fluids. He has eaten a little but not a lot over the past few days. He is not in a lot of pain. Just does not feel like eating. He is still nauseated.  His urine is yellow and darker.  His appetite is down.   1. Nausea     Outpatient Encounter Medications as of 12/20/2022  Medication Sig   ondansetron (ZOFRAN-ODT) 4 MG disintegrating tablet Take 1 tablet (4 mg total) by mouth every 8 (eight) hours as needed for nausea or vomiting.   acetaminophen (TYLENOL) 500 MG tablet Take 1,000 mg by mouth daily as needed for headache or moderate pain.   apixaban (ELIQUIS) 2.5 MG TABS tablet Take 1 tablet (2.5 mg total) by mouth 2 (two) times daily.   aspirin EC 81 MG tablet Take 1 tablet (81 mg total) by mouth daily. Swallow whole.   atorvastatin (LIPITOR) 40 MG tablet Take 1 tablet (40 mg total) by mouth daily.   azelastine (ASTELIN) 0.1 % nasal spray PLACE 1 SPRAY IN EACH NOSTRIL ONCE A DAY AS DIRECTED (Patient taking differently: Place 1  spray into both nostrils at bedtime.)   cholecalciferol (CHOLECALCIFEROL) 25 MCG tablet Take 1 tablet (1,000 Units total) by mouth every evening.   CRANBERRY PO Take 1 tablet by mouth every evening.   Cyanocobalamin (VITAMIN B-12 PO) Take 1 tablet by mouth every evening.   diclofenac Sodium (VOLTAREN) 1 % GEL Apply 2 g topically 4 (four) times daily as needed (arthritis).   docusate sodium (COLACE) 100 MG capsule Take 1 capsule (100 mg total) by mouth every evening.   finasteride (PROSCAR) 5 MG tablet Take 5 mg by mouth daily.   fluticasone (FLONASE) 50 MCG/ACT nasal spray Place 2 sprays into both nostrils daily. (Patient taking differently: Place 2 sprays into both nostrils at bedtime.)   Fluticasone-Umeclidin-Vilant (TRELEGY ELLIPTA) 100-62.5-25 MCG/ACT AEPB Inhale 1 puff into the lungs every evening. (Patient taking differently: Inhale 1 puff into the lungs daily.)   gabapentin (NEURONTIN) 300 MG capsule Take 300 mg by mouth at bedtime.   latanoprost (XALATAN) 0.005 % ophthalmic solution Place 1 drop into both eyes at bedtime.   lubiprostone (AMITIZA) 24 MCG capsule Take 1 capsule (24 mcg total) by mouth 2 (two) times daily with a meal. For constipation   oxyCODONE-acetaminophen (PERCOCET/ROXICET) 5-325 MG tablet Take 0.5-1 tablets by mouth every 8 (eight) hours as needed for severe pain (USE ONLY IF NEEDED).   silodosin (RAPAFLO) 8 MG CAPS capsule Take 8 mg by  mouth daily.   timolol (TIMOPTIC) 0.5 % ophthalmic solution Place 1 drop into the left eye every morning.   VENTOLIN HFA 108 (90 Base) MCG/ACT inhaler INHALE 2 PUFFS EVERY 6 HOURS AS NEEDED FOR WHEEZING OR SHORTNESS OF BREATH   No facility-administered encounter medications on file as of 12/20/2022.    Review of Systems  Constitutional:  Positive for appetite change. Negative for chills and fever.  Respiratory:  Negative for shortness of breath and wheezing.   Cardiovascular:  Negative for chest pain and leg swelling.   Gastrointestinal:  Positive for nausea. Negative for abdominal pain, blood in stool, constipation, diarrhea and vomiting.  Genitourinary:  Positive for difficulty urinating (Has catheter in place and working with urology).  Musculoskeletal:  Negative for back pain and gait problem.  Skin:  Negative for rash.  All other systems reviewed and are negative.   Observations/Objective: Patient sounds comfortable and in no acute distress  Assessment and Plan: Problem List Items Addressed This Visit   None Visit Diagnoses     Nausea    -  Primary   Relevant Medications   ondansetron (ZOFRAN-ODT) 4 MG disintegrating tablet       Will send nausea medicine to help and encouraged hydration, going to follow-up with PCP tomorrow but daughter was just concerned because he has not been hydrating well. Follow up plan: Return if symptoms worsen or fail to improve.     I discussed the assessment and treatment plan with the patient. The patient was provided an opportunity to ask questions and all were answered. The patient agreed with the plan and demonstrated an understanding of the instructions.   The patient was advised to call back or seek an in-person evaluation if the symptoms worsen or if the condition fails to improve as anticipated.  The above assessment and management plan was discussed with the patient. The patient verbalized understanding of and has agreed to the management plan. Patient is aware to call the clinic if symptoms persist or worsen. Patient is aware when to return to the clinic for a follow-up visit. Patient educated on when it is appropriate to go to the emergency department.    I provided 13 minutes of non-face-to-face time during this encounter.    Charles Pyle, MD

## 2022-12-21 ENCOUNTER — Telehealth: Payer: Self-pay | Admitting: Family Medicine

## 2022-12-21 ENCOUNTER — Emergency Department (HOSPITAL_COMMUNITY)
Admission: EM | Admit: 2022-12-21 | Discharge: 2022-12-21 | Disposition: A | Discharge status: Home / Self Care | Payer: Medicare Other | Attending: Emergency Medicine | Admitting: Emergency Medicine

## 2022-12-21 ENCOUNTER — Other Ambulatory Visit: Payer: Self-pay

## 2022-12-21 ENCOUNTER — Encounter (HOSPITAL_COMMUNITY): Payer: Self-pay

## 2022-12-21 ENCOUNTER — Ambulatory Visit: Payer: Medicare Other | Admitting: Family Medicine

## 2022-12-21 DIAGNOSIS — R34 Anuria and oliguria: Secondary | ICD-10-CM | POA: Diagnosis present

## 2022-12-21 DIAGNOSIS — R112 Nausea with vomiting, unspecified: Secondary | ICD-10-CM | POA: Insufficient documentation

## 2022-12-21 DIAGNOSIS — R600 Localized edema: Secondary | ICD-10-CM | POA: Diagnosis not present

## 2022-12-21 DIAGNOSIS — Z7982 Long term (current) use of aspirin: Secondary | ICD-10-CM | POA: Insufficient documentation

## 2022-12-21 DIAGNOSIS — T83091A Other mechanical complication of indwelling urethral catheter, initial encounter: Secondary | ICD-10-CM | POA: Diagnosis not present

## 2022-12-21 DIAGNOSIS — N189 Chronic kidney disease, unspecified: Secondary | ICD-10-CM | POA: Insufficient documentation

## 2022-12-21 DIAGNOSIS — J449 Chronic obstructive pulmonary disease, unspecified: Secondary | ICD-10-CM | POA: Diagnosis not present

## 2022-12-21 DIAGNOSIS — Z7901 Long term (current) use of anticoagulants: Secondary | ICD-10-CM | POA: Insufficient documentation

## 2022-12-21 LAB — URINALYSIS, ROUTINE W REFLEX MICROSCOPIC
Bilirubin Urine: NEGATIVE
Glucose, UA: NEGATIVE mg/dL
Ketones, ur: NEGATIVE mg/dL
Nitrite: NEGATIVE
Protein, ur: 30 mg/dL — AB
RBC / HPF: >50 RBC/hpf (ref 0–5)
Specific Gravity, Urine: 1.012 (ref 1.005–1.030)
pH: 5 (ref 5.0–8.0)

## 2022-12-21 LAB — BASIC METABOLIC PANEL
Anion gap: 8 (ref 5–15)
BUN: 25 mg/dL — ABNORMAL HIGH (ref 8–23)
CO2: 22 mmol/L (ref 22–32)
Calcium: 8.5 mg/dL — ABNORMAL LOW (ref 8.9–10.3)
Chloride: 105 mmol/L (ref 98–111)
Creatinine, Ser: 1.89 mg/dL — ABNORMAL HIGH (ref 0.61–1.24)
GFR, Estimated: 32 mL/min — ABNORMAL LOW (ref >60)
Glucose, Bld: 109 mg/dL — ABNORMAL HIGH (ref 70–99)
Potassium: 4.1 mmol/L (ref 3.5–5.1)
Sodium: 135 mmol/L (ref 135–145)

## 2022-12-21 LAB — CBC
HCT: 31.9 % — ABNORMAL LOW (ref 39.0–52.0)
Hemoglobin: 10 g/dL — ABNORMAL LOW (ref 13.0–17.0)
MCH: 31.3 pg (ref 26.0–34.0)
MCHC: 31.3 g/dL (ref 30.0–36.0)
MCV: 99.7 fL (ref 80.0–100.0)
Platelets: 357 10*3/uL (ref 150–400)
RBC: 3.2 MIL/uL — ABNORMAL LOW (ref 4.22–5.81)
RDW: 14.6 % (ref 11.5–15.5)
WBC: 10.5 10*3/uL (ref 4.0–10.5)
nRBC: 0 % (ref 0.0–0.2)

## 2022-12-21 MED ORDER — LIDOCAINE HCL URETHRAL/MUCOSAL 2 % EX GEL
1.0000 | Freq: Once | CUTANEOUS | Status: AC
  Administered 2022-12-21: 1 via TOPICAL
  Filled 2022-12-21: qty 11

## 2022-12-21 MED ORDER — ONDANSETRON 4 MG PO TBDP
4.0000 mg | ORAL_TABLET | Freq: Once | ORAL | Status: AC
  Administered 2022-12-21: 4 mg via ORAL
  Filled 2022-12-21: qty 1

## 2022-12-21 NOTE — Telephone Encounter (Signed)
ABSOLUTELY back to ER if no urine output and refractory nausea.

## 2022-12-21 NOTE — ED Triage Notes (Signed)
Pt c/o N/Vx1wk. Per pt's daughter, pt does not have much urine in cath bag. Pt has urinary cath in. PT denies abd pain

## 2022-12-21 NOTE — Telephone Encounter (Signed)
Pts daughter wants to speak with nurse asap regarding pts catheter.

## 2022-12-21 NOTE — ED Provider Notes (Signed)
[place] EMERGENCY DEPARTMENT AT [facility] Provider Note   CSN: [id number] Arrival date & time: 12/21/22  1054     History  Chief Complaint  Patient presents with   oliguria    Charles Golden is a 89 y.o. male.  Patient is a 89 year old male with a history of COPD, CVA, atrial fibrillation, chronic kidney disease, prostate enlargement causing bladder outlet obstruction and recent renal failure requiring a Foley catheter be placed.  Patient was released from the hospital 2 days ago and is presenting today due to family being concerned that his Foley has not put out any fluid.  Prior to going into the hospital patient had significant swelling in his lower extremities and after a Foley catheter was placed patient had put out a significant amount of urine.  He reports the swelling in his legs has significantly improved and he is lost 20 pounds.  During his hospitalization and since being home they say he has continued to deal with nausea and did have an episode of emesis this morning but he reports taking medication in the has gotten better.  He has not had any fever or cough.  Denies any chest pain or shortness of breath.  They called his doctor and told him the catheter had only drained maybe 5 mL since leaving the hospital and they recommended he come back to the emergency room.  His only complaint is some irritation at the end of his penis but denies any abdominal pain.  Last bowel movement was yesterday.  He is not on any diuretics such as Lasix and they were following his renal output which was improving when he left the hospital.  The history is provided by the patient and a relative.       Home Medications Prior to Admission medications   Medication Sig Start Date End Date Taking? Authorizing Provider  acetaminophen (TYLENOL) 500 MG tablet Take 1,000 mg by mouth daily as needed for headache or moderate pain.    [provider]  apixaban (ELIQUIS) 2.5 MG TABS  tablet Take 1 tablet (2.5 mg total) by mouth 2 (two) times daily. 01/24/22   [name], DO  aspirin EC 81 MG tablet Take 1 tablet (81 mg total) by mouth daily. Swallow whole. 08/03/21   Micki Riley, MD  atorvastatin (LIPITOR) 40 MG tablet Take 1 tablet (40 mg total) by mouth daily. 01/24/22   [name], DO  azelastine (ASTELIN) 0.1 % nasal spray PLACE 1 SPRAY IN EACH NOSTRIL ONCE A DAY AS DIRECTED Patient taking differently: Place 1 spray into both nostrils at bedtime. 11/27/22   [name], DO  cholecalciferol (CHOLECALCIFEROL) 25 MCG tablet Take 1 tablet (1,000 Units total) by mouth every evening. 12/18/22   Azucena Fallen, MD  CRANBERRY PO Take 1 tablet by mouth every evening.    [provider]  Cyanocobalamin (VITAMIN B-12 PO) Take 1 tablet by mouth every evening.    [provider]  diclofenac Sodium (VOLTAREN) 1 % GEL Apply 2 g topically 4 (four) times daily as needed (arthritis). 08/08/22   [name], DO  docusate sodium (COLACE) 100 MG capsule Take 1 capsule (100 mg total) by mouth every evening. 12/18/22   Azucena Fallen, MD  finasteride (PROSCAR) 5 MG tablet Take 5 mg by mouth daily.    [provider]  fluticasone (FLONASE) 50 MCG/ACT nasal spray Place 2 sprays into both nostrils daily. Patient taking differently: Place 2  sprays into both nostrils at bedtime. 01/03/22   [name], DO  Fluticasone-Umeclidin-Vilant (TRELEGY ELLIPTA) 100-62.5-25 MCG/ACT AEPB Inhale 1 puff into the lungs every evening. Patient taking differently: Inhale 1 puff into the lungs daily. 09/17/22   Coralyn Helling, MD  gabapentin (NEURONTIN) 300 MG capsule Take 300 mg by mouth at bedtime.    [provider]  latanoprost (XALATAN) 0.005 % ophthalmic solution Place 1 drop into both eyes at bedtime. 12/18/22   Azucena Fallen, MD  lubiprostone (AMITIZA) 24 MCG capsule Take 1 capsule (24 mcg total) by mouth 2 (two) times daily  with a meal. For constipation 12/11/22   Delynn Flavin M, DO  ondansetron (ZOFRAN-ODT) 4 MG disintegrating tablet Take 1 tablet (4 mg total) by mouth every 8 (eight) hours as needed for nausea or vomiting. 12/20/22   Dettinger, Elige Radon, MD  oxyCODONE-acetaminophen (PERCOCET/ROXICET) 5-325 MG tablet Take 0.5-1 tablets by mouth every 8 (eight) hours as needed for severe pain (USE ONLY IF NEEDED). 11/20/22   [name], DO  silodosin (RAPAFLO) 8 MG CAPS capsule Take 8 mg by mouth daily. 06/02/21   [provider]  timolol (TIMOPTIC) 0.5 % ophthalmic solution Place 1 drop into the left eye every morning. 11/24/18   [provider]  VENTOLIN HFA 108 (90 Base) MCG/ACT inhaler INHALE 2 PUFFS EVERY 6 HOURS AS NEEDED FOR WHEEZING OR SHORTNESS OF BREATH 12/12/22   Delynn Flavin M, DO      Allergies    Demerol [meperidine] and Spiriva handihaler [tiotropium bromide monohydrate]    Review of Systems   Review of Systems  Physical Exam Updated Vital Signs BP (!) 160/66   Pulse 71   Temp 98.2 F (36.8 C) (Oral)   Resp 16   Ht 5\' 10"  (1.778 m)   Wt 73.8 kg   SpO2 100%   BMI 23.34 kg/m  Physical Exam Vitals and nursing note reviewed.  Constitutional:      General: He is not in acute distress.    Appearance: He is well-developed.  HENT:     Head: Normocephalic and atraumatic.  Eyes:     Conjunctiva/sclera: Conjunctivae normal.     Pupils: Pupils are equal, round, and reactive to light.  Cardiovascular:     Rate and Rhythm: Normal rate. Rhythm irregular.     Heart sounds: No murmur heard. Pulmonary:     Effort: Pulmonary effort is normal. No respiratory distress.     Breath sounds: Normal breath sounds. No wheezing or rales.  Abdominal:     General: There is no distension.     Palpations: Abdomen is soft.     Tenderness: There is no abdominal tenderness. There is no guarding or rebound.     Comments: Nontender mass palpated above the suprapubic bone   Genitourinary:    Comments: Foley catheter in place with some minimal discharge at the urethral meatus.  No urine in the leg bag Musculoskeletal:        General: No tenderness. Normal range of motion.     Cervical back: Normal range of motion and neck supple.     Right lower leg: Edema present.     Left lower leg: Edema present.     Comments: 1+ pitting edema bilaterally to the mid shin  Skin:    General: Skin is warm and dry.     Findings: No erythema or rash.  Neurological:     Mental Status: He is alert and oriented to person, place,  and time. Mental status is at baseline.  Psychiatric:        Behavior: Behavior normal.     ED Results / Procedures / Treatments   Labs (all labs ordered are listed, but only abnormal results are displayed) Labs Reviewed  URINALYSIS, ROUTINE W REFLEX MICROSCOPIC - Abnormal; Notable for the following components:      Result Value   APPearance HAZY (*)    Hgb urine dipstick LARGE (*)    Protein, ur 30 (*)    Leukocytes,Ua LARGE (*)    Bacteria, UA FEW (*)    All other components within normal limits  BASIC METABOLIC PANEL - Abnormal; Notable for the following components:   Glucose, Bld 109 (*)    BUN 25 (*)    Creatinine, Ser 1.89 (*)    Calcium 8.5 (*)    GFR, Estimated 32 (*)    All other components within normal limits  CBC - Abnormal; Notable for the following components:   RBC 3.20 (*)    Hemoglobin 10.0 (*)    HCT 31.9 (*)    All other components within normal limits    EKG None  Radiology No results found.  Procedures Procedures    Medications Ordered in ED Medications  ondansetron (ZOFRAN-ODT) disintegrating tablet 4 mg (4 mg Oral Given 12/21/22 1148)  lidocaine (XYLOCAINE) 2 % jelly 1 Application (1 Application Topical Given 12/21/22 1702)    ED Course/ Medical Decision Making/ A&P                             Medical Decision Making Amount and/or Complexity of Data Reviewed Independent Historian:  caregiver External Data Reviewed: notes. Labs: ordered. Decision-making details documented in ED Course.   Pt with multiple medical problems and comorbidities and presenting today with a complaint that caries a high risk for morbidity and mortality.  Here today because his leg bag is not draining in the setting of recent bladder obstruction and AKI.  Patient's creatinine peaked at almost 5 and when he had left the hospital creatinine was 2.  Concern for repeat obstruction, worsening AKI.  Patient is not having any infectious symptoms.  Is still having some intermittent vomiting but at this time has no abdominal pain however bladder is palpable.  Concern for obstruction of Foley catheter.  He currently has a 20 [place] and concerned maybe there is exudate or it has been kinked.  Bedside ultrasound did show a large amount of urine retained in the bladder.  Will change Foley out for a larger catheter.  I independently interpreted patient's labs today and renal function continues to improve now creatinine is 1.89 BUN is normal, CBC normal with improving hemoglobin now of 10.  6:32 PM New catheter was placed and 1300 mL were removed.  Patient's urine has red cells and a few white cells but no obvious signs for infection.  At this time we will discharge home with family.  He already has follow-up in place.         Final Clinical Impression(s) / ED Diagnoses Final diagnoses:  Obstruction of Foley catheter, initial encounter [facility])    Rx / DC Orders ED Discharge Orders     None         Gwyneth Sprout, MD 12/21/22 [phone]

## 2022-12-21 NOTE — Telephone Encounter (Signed)
Patient has a follow up appointment scheduled with you this afternoon after being in the hospital, but daughter is concerned because he is having nausea and very, very little urine output.  She said since coming home from the hospital on Wednesday he has had hardly no urine output.  He did have a video visit with Dr. Louanne Skye yesteday and was given Zofran for nausea.  She would like to know if you recommend they take him back to the ER now and wait to see you to be evaluated this afternoon.  Please advise.

## 2022-12-21 NOTE — Telephone Encounter (Signed)
When I called back she has already taken him to the ER, they are there now.  I am cancelling the appointment with you this afternoon.

## 2022-12-24 ENCOUNTER — Encounter: Payer: Self-pay | Admitting: Family Medicine

## 2022-12-24 ENCOUNTER — Telehealth: Payer: Self-pay

## 2022-12-24 ENCOUNTER — Other Ambulatory Visit: Payer: Medicare Other

## 2022-12-24 DIAGNOSIS — N189 Chronic kidney disease, unspecified: Secondary | ICD-10-CM | POA: Diagnosis not present

## 2022-12-24 DIAGNOSIS — R809 Proteinuria, unspecified: Secondary | ICD-10-CM | POA: Diagnosis not present

## 2022-12-24 DIAGNOSIS — D631 Anemia in chronic kidney disease: Secondary | ICD-10-CM | POA: Diagnosis not present

## 2022-12-24 NOTE — Transitions of Care (Post Inpatient/ED Visit) (Signed)
12/24/2022  Name: Charles Golden MRN: [id number] DOB: June 04, 1925  Today's TOC FU Call Status: Today's TOC FU Call Status:: Successful TOC FU Call Competed TOC FU Call Complete Date: 12/24/22  Transition Care Management Follow-up Telephone Call Date of Discharge: 12/19/22 Discharge Facility: [facility]) Type of Discharge: Inpatient Admission Primary Inpatient Discharge Diagnosis:: Acute Kidney Injury Reason for ED Visit: Other: How have you been since you were released from the hospital?: Better Any questions or concerns?: No  Items Reviewed: Did you receive and understand the discharge instructions provided?: Yes Medications obtained,verified, and reconciled?: No Medications Not Reviewed Reasons:: Other: (Could not reach patient and call with daughter dropped) Any new allergies since your discharge?: No Dietary orders reviewed?: No Do you have support at home?: Yes People in Home: child(ren), adult Name of Support/Comfort Primary Source: [place]  Medications Reviewed Today: Medications Reviewed Today     Reviewed by Jodelle Gross, RN (Case Manager) on 12/24/22 at 1419  Med List Status: <None>   Medication Order Taking? Sig Documenting Provider Last Dose Status Informant  acetaminophen (TYLENOL) 500 MG tablet [id number] No Take 1,000 mg by mouth daily as needed for headache or moderate pain. [provider] Unknown Active Child, Pharmacy Records           Med Note Electa Sniff, [facility]   Mon Dec 24, 2022  2:19 PM) TOC unable to do medication reconciliation  apixaban (ELIQUIS) 2.5 MG TABS tablet [id number]  Take 1 tablet (2.5 mg total) by mouth 2 (two) times daily. [name], DO  Active Child, Pharmacy Records  aspirin EC 81 MG tablet [id number]  Take 1 tablet (81 mg total) by mouth daily. Swallow whole. Micki Riley, MD  Active Child, Pharmacy Records  atorvastatin (LIPITOR) 40 MG tablet [id number]  Take 1 tablet (40 mg total) by mouth daily. [name], DO  Active Child, Pharmacy Records  azelastine (ASTELIN) 0.1 % nasal spray [id number]  PLACE 1 SPRAY IN EACH NOSTRIL ONCE A DAY AS DIRECTED  Patient taking differently: Place 1 spray into both nostrils at bedtime.   [name], DO  Active Child, Pharmacy Records  cholecalciferol (CHOLECALCIFEROL) 25 MCG tablet [id number]  Take 1 tablet (1,000 Units total) by mouth every evening. Azucena Fallen, MD  Active   CRANBERRY PO [id number]  Take 1 tablet by mouth every evening. [provider]  Active Child, Pharmacy Records  Cyanocobalamin (VITAMIN B-12 PO) [id number]  Take 1 tablet by mouth every evening. [provider]  Active Child, Pharmacy Records  diclofenac Sodium (VOLTAREN) 1 % GEL [id number]  Apply 2 g topically 4 (four) times daily as needed (arthritis). [name], DO  Active Child, Pharmacy Records  docusate sodium (COLACE) 100 MG capsule [id number]  Take 1 capsule (100 mg total) by mouth every evening. Azucena Fallen, MD  Active   finasteride (PROSCAR) 5 MG tablet [id number]  Take 5 mg by mouth daily. [provider]  Active Child, Pharmacy Records  fluticasone [facility]) 50 MCG/ACT nasal spray [id number]  Place 2 sprays into both nostrils daily.  Patient taking differently: Place 2 sprays into both nostrils at bedtime.   [name], DO  Active Child, Pharmacy Records  Fluticasone-Umeclidin-Vilant [facility] ELLIPTA) 100-62.5-25 MCG/ACT AEPB [id number]  Inhale 1 puff into the lungs every evening.  Patient taking differently: Inhale 1 puff into the lungs daily.   Coralyn Helling, MD  Active Child, Pharmacy Records  gabapentin (NEURONTIN) 300 MG capsule [id number]  Take 300  mg by mouth at bedtime. [provider]  Active Child, Pharmacy Records  latanoprost (XALATAN) 0.005 % ophthalmic solution [id number]  Place 1 drop into both eyes at bedtime. Azucena Fallen, MD  Active   lubiprostone [facility]) 24 MCG capsule [id number]   Take 1 capsule (24 mcg total) by mouth 2 (two) times daily with a meal. For constipation Delynn Flavin M, DO  Active Child, Pharmacy Records  ondansetron (ZOFRAN-ODT) 4 MG disintegrating tablet [id number]  Take 1 tablet (4 mg total) by mouth every 8 (eight) hours as needed for nausea or vomiting. Dettinger, Elige Radon, MD  Active   oxyCODONE-acetaminophen (PERCOCET/ROXICET) 5-325 MG tablet [id number]  Take 0.5-1 tablets by mouth every 8 (eight) hours as needed for severe pain (USE ONLY IF NEEDED). [name], DO  Active Child, Pharmacy Records  silodosin (RAPAFLO) 8 MG CAPS capsule [id number]  Take 8 mg by mouth daily. [provider]  Active Child, Pharmacy Records  timolol (TIMOPTIC) 0.5 % ophthalmic solution [id number]  Place 1 drop into the left eye every morning. [provider]  Active Child, Pharmacy Records  VENTOLIN HFA 108 [phone] Base) MCG/ACT inhaler [id number]  INHALE 2 PUFFS EVERY 6 HOURS AS NEEDED FOR WHEEZING OR SHORTNESS OF BREATH [name], DO  Active Child, Pharmacy Records            Home Care and Equipment/Supplies: Were Home Health Services Ordered?: Yes Name of Home Health Agency:: [facility] Any new equipment or medical supplies ordered?: No  Functional Questionnaire: Do you need assistance with bathing/showering or dressing?: No Do you need assistance with meal preparation?: No Do you need assistance with eating?: No Do you have difficulty maintaining continence: No Do you need assistance with getting out of bed/getting out of a chair/moving?: No Do you have difficulty managing or taking your medications?: No  Follow up appointments reviewed: PCP Follow-up appointment confirmed?: Yes Date of PCP follow-up appointment?: 12/20/22 Follow-up Provider: Dr. Louanne Skye Specialist [facility] Follow-up appointment confirmed?: Yes Date of Specialist follow-up appointment?: 12/26/22 Follow-Up Specialty Provider:: Dr. Annabell Howells Do you need  transportation to your follow-up appointment?: No Do you understand care options if your condition(s) worsen?: Yes-patient verbalized understanding  SDOH Interventions Today    Flowsheet Row Most Recent Value  SDOH Interventions   Food Insecurity Interventions Intervention Not Indicated  Housing Interventions Intervention Not Indicated  Transportation Interventions Intervention Not Indicated      Jodelle Gross, RN, BSN, CCM Care Management Coordinator [facility] Phone: [phone]/Fax: [phone]

## 2022-12-25 DIAGNOSIS — R338 Other retention of urine: Secondary | ICD-10-CM | POA: Diagnosis not present

## 2022-12-25 DIAGNOSIS — N401 Enlarged prostate with lower urinary tract symptoms: Secondary | ICD-10-CM | POA: Diagnosis not present

## 2022-12-26 DIAGNOSIS — H409 Unspecified glaucoma: Secondary | ICD-10-CM | POA: Diagnosis not present

## 2022-12-26 DIAGNOSIS — N1832 Chronic kidney disease, stage 3b: Secondary | ICD-10-CM | POA: Diagnosis not present

## 2022-12-26 DIAGNOSIS — Z7982 Long term (current) use of aspirin: Secondary | ICD-10-CM | POA: Diagnosis not present

## 2022-12-26 DIAGNOSIS — N179 Acute kidney failure, unspecified: Secondary | ICD-10-CM | POA: Diagnosis not present

## 2022-12-26 DIAGNOSIS — K219 Gastro-esophageal reflux disease without esophagitis: Secondary | ICD-10-CM | POA: Diagnosis not present

## 2022-12-26 DIAGNOSIS — Z8673 Personal history of transient ischemic attack (TIA), and cerebral infarction without residual deficits: Secondary | ICD-10-CM | POA: Diagnosis not present

## 2022-12-26 DIAGNOSIS — Z556 Problems related to health literacy: Secondary | ICD-10-CM | POA: Diagnosis not present

## 2022-12-26 DIAGNOSIS — H9190 Unspecified hearing loss, unspecified ear: Secondary | ICD-10-CM | POA: Diagnosis not present

## 2022-12-26 DIAGNOSIS — Z79899 Other long term (current) drug therapy: Secondary | ICD-10-CM | POA: Diagnosis not present

## 2022-12-26 DIAGNOSIS — J479 Bronchiectasis, uncomplicated: Secondary | ICD-10-CM | POA: Diagnosis not present

## 2022-12-26 DIAGNOSIS — Z87891 Personal history of nicotine dependence: Secondary | ICD-10-CM | POA: Diagnosis not present

## 2022-12-26 DIAGNOSIS — J449 Chronic obstructive pulmonary disease, unspecified: Secondary | ICD-10-CM | POA: Diagnosis not present

## 2022-12-26 DIAGNOSIS — N1330 Unspecified hydronephrosis: Secondary | ICD-10-CM | POA: Diagnosis not present

## 2022-12-26 DIAGNOSIS — Z7951 Long term (current) use of inhaled steroids: Secondary | ICD-10-CM | POA: Diagnosis not present

## 2022-12-26 DIAGNOSIS — Z7901 Long term (current) use of anticoagulants: Secondary | ICD-10-CM | POA: Diagnosis not present

## 2022-12-26 DIAGNOSIS — I4891 Unspecified atrial fibrillation: Secondary | ICD-10-CM | POA: Diagnosis not present

## 2022-12-26 DIAGNOSIS — Z466 Encounter for fitting and adjustment of urinary device: Secondary | ICD-10-CM | POA: Diagnosis not present

## 2022-12-26 DIAGNOSIS — N400 Enlarged prostate without lower urinary tract symptoms: Secondary | ICD-10-CM | POA: Diagnosis not present

## 2022-12-26 DIAGNOSIS — M25561 Pain in right knee: Secondary | ICD-10-CM | POA: Diagnosis not present

## 2022-12-28 ENCOUNTER — Encounter: Payer: Self-pay | Admitting: Family Medicine

## 2022-12-31 ENCOUNTER — Other Ambulatory Visit: Payer: Self-pay | Admitting: Family Medicine

## 2022-12-31 DIAGNOSIS — N1832 Chronic kidney disease, stage 3b: Secondary | ICD-10-CM | POA: Diagnosis not present

## 2022-12-31 DIAGNOSIS — N179 Acute kidney failure, unspecified: Secondary | ICD-10-CM | POA: Diagnosis not present

## 2022-12-31 DIAGNOSIS — R630 Anorexia: Secondary | ICD-10-CM

## 2022-12-31 DIAGNOSIS — N401 Enlarged prostate with lower urinary tract symptoms: Secondary | ICD-10-CM | POA: Diagnosis not present

## 2022-12-31 DIAGNOSIS — N138 Other obstructive and reflux uropathy: Secondary | ICD-10-CM | POA: Diagnosis not present

## 2022-12-31 MED ORDER — MIRTAZAPINE 7.5 MG PO TABS
7.5000 mg | ORAL_TABLET | Freq: Every day | ORAL | 3 refills | Status: AC
Start: 2022-12-31 — End: ?

## 2023-01-01 ENCOUNTER — Other Ambulatory Visit (HOSPITAL_BASED_OUTPATIENT_CLINIC_OR_DEPARTMENT_OTHER): Payer: Self-pay

## 2023-01-01 MED FILL — Fluticasone-Umeclidinium-Vilanterol AEPB 100-62.5-25 MCG/ACT: RESPIRATORY_TRACT | 30 days supply | Qty: 60 | Fill #1 | Status: AC

## 2023-01-02 ENCOUNTER — Other Ambulatory Visit (HOSPITAL_BASED_OUTPATIENT_CLINIC_OR_DEPARTMENT_OTHER): Payer: Self-pay

## 2023-01-04 ENCOUNTER — Ambulatory Visit (HOSPITAL_BASED_OUTPATIENT_CLINIC_OR_DEPARTMENT_OTHER)
Admission: RE | Admit: 2023-01-04 | Discharge: 2023-01-04 | Disposition: A | Discharge status: Home / Self Care | Payer: Medicare Other | Source: Ambulatory Visit | Attending: Neurosurgery | Admitting: Neurosurgery

## 2023-01-04 ENCOUNTER — Ambulatory Visit: Payer: Medicare Other | Admitting: Family Medicine

## 2023-01-04 ENCOUNTER — Encounter: Payer: Self-pay | Admitting: Family Medicine

## 2023-01-04 DIAGNOSIS — X58XXXA Exposure to other specified factors, initial encounter: Secondary | ICD-10-CM | POA: Insufficient documentation

## 2023-01-04 DIAGNOSIS — M25662 Stiffness of left knee, not elsewhere classified: Secondary | ICD-10-CM | POA: Diagnosis not present

## 2023-01-04 DIAGNOSIS — S83242A Other tear of medial meniscus, current injury, left knee, initial encounter: Secondary | ICD-10-CM | POA: Diagnosis not present

## 2023-01-04 DIAGNOSIS — M25562 Pain in left knee: Secondary | ICD-10-CM | POA: Insufficient documentation

## 2023-01-04 DIAGNOSIS — M25462 Effusion, left knee: Secondary | ICD-10-CM | POA: Diagnosis not present

## 2023-01-04 DIAGNOSIS — R600 Localized edema: Secondary | ICD-10-CM | POA: Diagnosis not present

## 2023-01-04 DIAGNOSIS — M238X2 Other internal derangements of left knee: Secondary | ICD-10-CM | POA: Diagnosis not present

## 2023-01-08 ENCOUNTER — Emergency Department (HOSPITAL_COMMUNITY): Payer: Medicare Other

## 2023-01-08 ENCOUNTER — Emergency Department (HOSPITAL_COMMUNITY)
Admission: EM | Admit: 2023-01-08 | Discharge: 2023-01-08 | Disposition: A | Discharge status: Home / Self Care | Payer: Medicare Other | Source: Home / Self Care | Attending: Emergency Medicine | Admitting: Emergency Medicine

## 2023-01-08 ENCOUNTER — Other Ambulatory Visit: Payer: Self-pay

## 2023-01-08 ENCOUNTER — Encounter (HOSPITAL_COMMUNITY): Payer: Self-pay | Admitting: *Deleted

## 2023-01-08 DIAGNOSIS — I4891 Unspecified atrial fibrillation: Secondary | ICD-10-CM | POA: Diagnosis not present

## 2023-01-08 DIAGNOSIS — N400 Enlarged prostate without lower urinary tract symptoms: Secondary | ICD-10-CM | POA: Diagnosis not present

## 2023-01-08 DIAGNOSIS — E860 Dehydration: Secondary | ICD-10-CM | POA: Diagnosis present

## 2023-01-08 DIAGNOSIS — Z7982 Long term (current) use of aspirin: Secondary | ICD-10-CM | POA: Insufficient documentation

## 2023-01-08 DIAGNOSIS — K219 Gastro-esophageal reflux disease without esophagitis: Secondary | ICD-10-CM | POA: Diagnosis not present

## 2023-01-08 DIAGNOSIS — G2581 Restless legs syndrome: Secondary | ICD-10-CM | POA: Diagnosis present

## 2023-01-08 DIAGNOSIS — E875 Hyperkalemia: Secondary | ICD-10-CM

## 2023-01-08 DIAGNOSIS — R351 Nocturia: Secondary | ICD-10-CM | POA: Diagnosis not present

## 2023-01-08 DIAGNOSIS — I480 Paroxysmal atrial fibrillation: Secondary | ICD-10-CM | POA: Diagnosis not present

## 2023-01-08 DIAGNOSIS — Z7901 Long term (current) use of anticoagulants: Secondary | ICD-10-CM | POA: Insufficient documentation

## 2023-01-08 DIAGNOSIS — H9190 Unspecified hearing loss, unspecified ear: Secondary | ICD-10-CM | POA: Diagnosis not present

## 2023-01-08 DIAGNOSIS — Y846 Urinary catheterization as the cause of abnormal reaction of the patient, or of later complication, without mention of misadventure at the time of the procedure: Secondary | ICD-10-CM | POA: Diagnosis present

## 2023-01-08 DIAGNOSIS — J449 Chronic obstructive pulmonary disease, unspecified: Secondary | ICD-10-CM | POA: Insufficient documentation

## 2023-01-08 DIAGNOSIS — Z95828 Presence of other vascular implants and grafts: Secondary | ICD-10-CM | POA: Diagnosis not present

## 2023-01-08 DIAGNOSIS — I509 Heart failure, unspecified: Secondary | ICD-10-CM | POA: Diagnosis present

## 2023-01-08 DIAGNOSIS — J479 Bronchiectasis, uncomplicated: Secondary | ICD-10-CM | POA: Diagnosis not present

## 2023-01-08 DIAGNOSIS — E785 Hyperlipidemia, unspecified: Secondary | ICD-10-CM | POA: Diagnosis not present

## 2023-01-08 DIAGNOSIS — D649 Anemia, unspecified: Secondary | ICD-10-CM | POA: Diagnosis not present

## 2023-01-08 DIAGNOSIS — N1832 Chronic kidney disease, stage 3b: Secondary | ICD-10-CM | POA: Diagnosis not present

## 2023-01-08 DIAGNOSIS — N281 Cyst of kidney, acquired: Secondary | ICD-10-CM | POA: Diagnosis not present

## 2023-01-08 DIAGNOSIS — D631 Anemia in chronic kidney disease: Secondary | ICD-10-CM | POA: Diagnosis present

## 2023-01-08 DIAGNOSIS — Z466 Encounter for fitting and adjustment of urinary device: Secondary | ICD-10-CM | POA: Diagnosis not present

## 2023-01-08 DIAGNOSIS — N390 Urinary tract infection, site not specified: Secondary | ICD-10-CM | POA: Diagnosis not present

## 2023-01-08 DIAGNOSIS — N189 Chronic kidney disease, unspecified: Secondary | ICD-10-CM | POA: Diagnosis not present

## 2023-01-08 DIAGNOSIS — M25561 Pain in right knee: Secondary | ICD-10-CM | POA: Diagnosis not present

## 2023-01-08 DIAGNOSIS — Z8673 Personal history of transient ischemic attack (TIA), and cerebral infarction without residual deficits: Secondary | ICD-10-CM | POA: Diagnosis not present

## 2023-01-08 DIAGNOSIS — N200 Calculus of kidney: Secondary | ICD-10-CM | POA: Diagnosis not present

## 2023-01-08 DIAGNOSIS — Z87891 Personal history of nicotine dependence: Secondary | ICD-10-CM | POA: Diagnosis not present

## 2023-01-08 DIAGNOSIS — I5032 Chronic diastolic (congestive) heart failure: Secondary | ICD-10-CM | POA: Diagnosis not present

## 2023-01-08 DIAGNOSIS — T83511A Infection and inflammatory reaction due to indwelling urethral catheter, initial encounter: Secondary | ICD-10-CM | POA: Diagnosis not present

## 2023-01-08 DIAGNOSIS — K5730 Diverticulosis of large intestine without perforation or abscess without bleeding: Secondary | ICD-10-CM | POA: Diagnosis not present

## 2023-01-08 DIAGNOSIS — N179 Acute kidney failure, unspecified: Secondary | ICD-10-CM | POA: Diagnosis not present

## 2023-01-08 DIAGNOSIS — N201 Calculus of ureter: Secondary | ICD-10-CM | POA: Insufficient documentation

## 2023-01-08 DIAGNOSIS — I130 Hypertensive heart and chronic kidney disease with heart failure and stage 1 through stage 4 chronic kidney disease, or unspecified chronic kidney disease: Secondary | ICD-10-CM | POA: Diagnosis present

## 2023-01-08 DIAGNOSIS — K581 Irritable bowel syndrome with constipation: Secondary | ICD-10-CM | POA: Diagnosis present

## 2023-01-08 DIAGNOSIS — N401 Enlarged prostate with lower urinary tract symptoms: Secondary | ICD-10-CM | POA: Diagnosis not present

## 2023-01-08 DIAGNOSIS — N1330 Unspecified hydronephrosis: Secondary | ICD-10-CM | POA: Diagnosis not present

## 2023-01-08 DIAGNOSIS — R110 Nausea: Secondary | ICD-10-CM | POA: Diagnosis not present

## 2023-01-08 DIAGNOSIS — R5381 Other malaise: Secondary | ICD-10-CM | POA: Diagnosis not present

## 2023-01-08 DIAGNOSIS — H9193 Unspecified hearing loss, bilateral: Secondary | ICD-10-CM | POA: Diagnosis present

## 2023-01-08 DIAGNOSIS — R1031 Right lower quadrant pain: Secondary | ICD-10-CM

## 2023-01-08 DIAGNOSIS — N138 Other obstructive and reflux uropathy: Secondary | ICD-10-CM | POA: Diagnosis present

## 2023-01-08 DIAGNOSIS — N12 Tubulo-interstitial nephritis, not specified as acute or chronic: Secondary | ICD-10-CM | POA: Diagnosis not present

## 2023-01-08 DIAGNOSIS — H409 Unspecified glaucoma: Secondary | ICD-10-CM | POA: Diagnosis not present

## 2023-01-08 LAB — COMPREHENSIVE METABOLIC PANEL
ALT: 14 U/L (ref 0–44)
AST: 18 U/L (ref 15–41)
Albumin: 3 g/dL — ABNORMAL LOW (ref 3.5–5.0)
Alkaline Phosphatase: 81 U/L (ref 38–126)
Anion gap: 9 (ref 5–15)
BUN: 28 mg/dL — ABNORMAL HIGH (ref 8–23)
CO2: 23 mmol/L (ref 22–32)
Calcium: 8.7 mg/dL — ABNORMAL LOW (ref 8.9–10.3)
Chloride: 105 mmol/L (ref 98–111)
Creatinine, Ser: 1.72 mg/dL — ABNORMAL HIGH (ref 0.61–1.24)
GFR, Estimated: 36 mL/min — ABNORMAL LOW (ref >60)
Glucose, Bld: 107 mg/dL — ABNORMAL HIGH (ref 70–99)
Potassium: 5.2 mmol/L — ABNORMAL HIGH (ref 3.5–5.1)
Sodium: 137 mmol/L (ref 135–145)
Total Bilirubin: 0.6 mg/dL (ref 0.3–1.2)
Total Protein: 6.2 g/dL — ABNORMAL LOW (ref 6.5–8.1)

## 2023-01-08 LAB — CBC
HCT: 36.3 % — ABNORMAL LOW (ref 39.0–52.0)
Hemoglobin: 11.1 g/dL — ABNORMAL LOW (ref 13.0–17.0)
MCH: 30.5 pg (ref 26.0–34.0)
MCHC: 30.6 g/dL (ref 30.0–36.0)
MCV: 99.7 fL (ref 80.0–100.0)
Platelets: 269 10*3/uL (ref 150–400)
RBC: 3.64 MIL/uL — ABNORMAL LOW (ref 4.22–5.81)
RDW: 14.3 % (ref 11.5–15.5)
WBC: 11.1 10*3/uL — ABNORMAL HIGH (ref 4.0–10.5)
nRBC: 0 % (ref 0.0–0.2)

## 2023-01-08 LAB — URINALYSIS, ROUTINE W REFLEX MICROSCOPIC
Bilirubin Urine: NEGATIVE
Glucose, UA: NEGATIVE mg/dL
Ketones, ur: NEGATIVE mg/dL
Nitrite: POSITIVE — AB
Protein, ur: 100 mg/dL — AB
RBC / HPF: >50 RBC/hpf (ref 0–5)
Specific Gravity, Urine: 1.012 (ref 1.005–1.030)
WBC, UA: >50 WBC/hpf (ref 0–5)
pH: 8 (ref 5.0–8.0)

## 2023-01-08 LAB — MAGNESIUM: Magnesium: 1.7 mg/dL (ref 1.7–2.4)

## 2023-01-08 LAB — LIPASE, BLOOD: Lipase: 40 U/L (ref 11–51)

## 2023-01-08 MED ORDER — METOCLOPRAMIDE HCL 5 MG/ML IJ SOLN
5.0000 mg | Freq: Once | INTRAMUSCULAR | Status: AC
  Administered 2023-01-08: 5 mg via INTRAVENOUS
  Filled 2023-01-08: qty 2

## 2023-01-08 MED ORDER — LOKELMA 10 G PO PACK: 10.0000 g | PACK | Freq: Two times a day (BID) | ORAL | 0 refills | Status: DC

## 2023-01-08 MED ORDER — LACTATED RINGERS IV BOLUS
1000.0000 mL | Freq: Once | INTRAVENOUS | Status: AC
  Administered 2023-01-08: 1000 mL via INTRAVENOUS

## 2023-01-08 MED ORDER — LOKELMA 10 G PO PACK: 10.0000 g | PACK | Freq: Two times a day (BID) | ORAL | 0 refills | Status: AC

## 2023-01-08 MED ORDER — CEPHALEXIN 500 MG PO CAPS: 500.0000 mg | ORAL_CAPSULE | Freq: Four times a day (QID) | ORAL | 0 refills | Status: DC

## 2023-01-08 MED ORDER — SODIUM ZIRCONIUM CYCLOSILICATE 5 G PO PACK
10.0000 g | PACK | Freq: Once | ORAL | Status: AC
  Administered 2023-01-08: 10 g via ORAL
  Filled 2023-01-08: qty 2

## 2023-01-08 NOTE — Discharge Instructions (Addendum)
Follow-up with urology.  Take the antibiotic Keflex as directed for possible urinary tract infection.  Urine has been sent for culture to help confirm.  Urine may look abnormal because of having the Foley catheter.  CT showed that you just recently passed a right ureteral stone.  It is now in the bladder and should not be any problem going forward.  But you do have additional stones up in the kidney.  Return for any new or worse symptoms.  Potassium was elevated a little bit here today.  They treated you with [facility] for that.  But very important that you have it rechecked by your primary care doctor in the next several days.  Take another 2 dose of Lokelma at home.  Prescription provided.

## 2023-01-08 NOTE — ED Provider Notes (Signed)
[name] Provider Note   CSN: [id number] Arrival date & time: 01/08/23  1342     History  Chief Complaint  Patient presents with   Abdominal Pain    Charles Golden is a 89 y.o. male.   Abdominal Pain Associated symptoms: constipation and nausea   Patient presents for abdominal pain.  Medical history includes IBS, BPH, COPD, GERD, RLS, atrial fibrillation.   3 weeks ago, he was hospitalized for urine retention and AKI.  He had Foley catheter placed.  He was seen in the ED 2 days after his discharge for Foley catheter dysfunction.  Foley catheter was replaced.  Foley catheter has been draining appropriately since then.  Last night, patient developed a right lower quadrant abdominal pain.  He states that he has a history of chronic constipation.  He took some MiraLAX and Dulcolax today and had 2 large bowel movements.  This did relieve his abdominal pain.  He has had some mild nausea without vomiting.  Earlier, when he was having abdominal pain, he became tremulous.  This prompted him to come to the ED.     Home Medications Prior to Admission medications   Medication Sig Start Date End Date Taking? Authorizing Provider  acetaminophen (TYLENOL) 500 MG tablet Take 1,000 mg by mouth daily as needed for headache or moderate pain.    [provider]  apixaban (ELIQUIS) 2.5 MG TABS tablet Take 1 tablet (2.5 mg total) by mouth 2 (two) times daily. 01/24/22   [name], DO  aspirin EC 81 MG tablet Take 1 tablet (81 mg total) by mouth daily. Swallow whole. 08/03/21   Micki Riley, MD  atorvastatin (LIPITOR) 40 MG tablet Take 1 tablet (40 mg total) by mouth daily. 01/24/22   [name], DO  azelastine (ASTELIN) 0.1 % nasal spray PLACE 1 SPRAY IN EACH NOSTRIL ONCE A DAY AS DIRECTED Patient taking differently: Place 1 spray into both nostrils at bedtime. 11/27/22   [name], DO  cholecalciferol (CHOLECALCIFEROL) 25  MCG tablet Take 1 tablet (1,000 Units total) by mouth every evening. 12/18/22   Azucena Fallen, MD  CRANBERRY PO Take 1 tablet by mouth every evening.    [provider]  Cyanocobalamin (VITAMIN B-12 PO) Take 1 tablet by mouth every evening.    [provider]  diclofenac Sodium (VOLTAREN) 1 % GEL Apply 2 g topically 4 (four) times daily as needed (arthritis). 08/08/22   [name], DO  docusate sodium (COLACE) 100 MG capsule Take 1 capsule (100 mg total) by mouth every evening. 12/18/22   Azucena Fallen, MD  finasteride (PROSCAR) 5 MG tablet Take 5 mg by mouth daily.    [provider]  fluticasone (FLONASE) 50 MCG/ACT nasal spray Place 2 sprays into both nostrils daily. Patient taking differently: Place 2 sprays into both nostrils at bedtime. 01/03/22   [name], DO  Fluticasone-Umeclidin-Vilant (TRELEGY ELLIPTA) 100-62.5-25 MCG/ACT AEPB Inhale 1 puff into the lungs every evening. Patient taking differently: Inhale 1 puff into the lungs daily. 09/17/22   Coralyn Helling, MD  gabapentin (NEURONTIN) 300 MG capsule Take 300 mg by mouth at bedtime.    [provider]  latanoprost (XALATAN) 0.005 % ophthalmic solution Place 1 drop into both eyes at bedtime. 12/18/22   Azucena Fallen, MD  lubiprostone (AMITIZA) 24 MCG capsule Take 1 capsule (24 mcg total) by mouth 2 (two) times daily with a meal.  For constipation 12/11/22   Delynn Flavin M, DO  mirtazapine (REMERON) 7.5 MG tablet Take 1 tablet (7.5 mg total) by mouth at bedtime. 12/31/22   [name], DO  ondansetron (ZOFRAN-ODT) 4 MG disintegrating tablet Take 1 tablet (4 mg total) by mouth every 8 (eight) hours as needed for nausea or vomiting. 12/20/22   Dettinger, Elige Radon, MD  oxyCODONE-acetaminophen (PERCOCET/ROXICET) 5-325 MG tablet Take 0.5-1 tablets by mouth every 8 (eight) hours as needed for severe pain (USE ONLY IF NEEDED). 11/20/22   [name], DO  silodosin  (RAPAFLO) 8 MG CAPS capsule Take 8 mg by mouth daily. 06/02/21   [provider]  timolol (TIMOPTIC) 0.5 % ophthalmic solution Place 1 drop into the left eye every morning. 11/24/18   [provider]  VENTOLIN HFA 108 (90 Base) MCG/ACT inhaler INHALE 2 PUFFS EVERY 6 HOURS AS NEEDED FOR WHEEZING OR SHORTNESS OF BREATH 12/12/22   Delynn Flavin M, DO      Allergies    Demerol [meperidine] and Spiriva handihaler [tiotropium bromide monohydrate]    Review of Systems   Review of Systems  Gastrointestinal:  Positive for abdominal pain, constipation and nausea.  Neurological:  Positive for tremors.  All other systems reviewed and are negative.   Physical Exam Updated Vital Signs BP (!) 141/72 (BP Location: Right Arm)   Pulse (!) 59   Temp 99.3 F (37.4 C) (Oral)   Resp 18   Ht 5\' 10"  (1.778 m)   Wt 66.7 kg   SpO2 94%   BMI 21.09 kg/m  Physical Exam Vitals and nursing note reviewed.  Constitutional:      General: He is not in acute distress.    Appearance: He is well-developed. He is not ill-appearing, toxic-appearing or diaphoretic.  HENT:     Head: Normocephalic and atraumatic.     Mouth/Throat:     Mouth: Mucous membranes are moist.  Eyes:     General: No scleral icterus.    Extraocular Movements: Extraocular movements intact.     Conjunctiva/sclera: Conjunctivae normal.  Cardiovascular:     Rate and Rhythm: Normal rate and regular rhythm.  Pulmonary:     Effort: Pulmonary effort is normal. No respiratory distress.  Abdominal:     Palpations: Abdomen is soft.     Tenderness: There is abdominal tenderness in the right lower quadrant. There is no guarding or rebound.  Musculoskeletal:        General: No swelling.     Cervical back: Neck supple.  Skin:    General: Skin is warm and dry.  Neurological:     General: No focal deficit present.     Mental Status: He is alert and oriented to person, place, and time.  Psychiatric:        Mood and Affect: Mood  normal.        Behavior: Behavior normal.     ED Results / Procedures / Treatments   Labs (all labs ordered are listed, but only abnormal results are displayed) Labs Reviewed  COMPREHENSIVE METABOLIC PANEL - Abnormal; Notable for the following components:      Result Value   Potassium 5.2 (*)    Glucose, Bld 107 (*)    BUN 28 (*)    Creatinine, Ser 1.72 (*)    Calcium 8.7 (*)    Total Protein 6.2 (*)    Albumin 3.0 (*)    GFR, Estimated 36 (*)    All other components within normal limits  CBC - Abnormal; Notable for the following components:   WBC 11.1 (*)    RBC 3.64 (*)    Hemoglobin 11.1 (*)    HCT 36.3 (*)    All other components within normal limits  LIPASE, BLOOD  MAGNESIUM  URINALYSIS, ROUTINE W REFLEX MICROSCOPIC    EKG None  Radiology No results found.  Procedures Procedures    Medications Ordered in ED Medications  lactated ringers bolus 1,000 mL (1,000 mLs Intravenous New Bag/Given 01/08/23 1508)  metoCLOPramide (REGLAN) injection 5 mg (5 mg Intravenous Given 01/08/23 1459)  sodium zirconium cyclosilicate (LOKELMA) packet 10 g (10 g Oral Given 01/08/23 1541)    ED Course/ Medical Decision Making/ A&P                                 Medical Decision Making Amount and/or Complexity of Data Reviewed Labs: ordered. Radiology: ordered.  Risk Prescription drug management.   This patient presents to the ED for concern of abdominal pain, this involves an extensive number of treatment options, and is a complaint that carries with it a high risk of complications and morbidity.  The differential diagnosis includes constipation, colitis, hernia, neoplasm, appendicitis   Co morbidities that complicate the patient evaluation  IBS, BPH, COPD, GERD, RLS, atrial fibrillation   Additional history obtained:  Additional history obtained from patient's daughter External records from outside source obtained and reviewed including EMR   Lab Tests:  I  Ordered, and personally interpreted labs.  The pertinent results include: Baseline anemia, mild leukocytosis, mild hypokalemia, creatinine improved from prior lab work   Imaging Studies ordered:  I ordered imaging studies including CT of abdomen and pelvis I independently visualized and interpreted imaging which showed (pending at time of signout) I agree with the radiologist interpretation   Problem List / ED Course / Critical interventions / Medication management  Patient presents for right lower abdominal pain since yesterday.  His symptoms did improved after 2 large bowel movements today.  Prior to that, he did have some constipation which she states is a chronic condition for him.  On arrival in the ED, patient is well-appearing.  His abdomen is soft.  He endorses mild tenderness to the right lower quadrant.  He also endorses some mild nausea.  Fluids and Reglan were ordered.  Lab work and CT imaging were ordered.  Lab work is notable for mild hyperkalemia.  Lokelma was ordered.  CT scan was pending at time of signout.  Care of patient was signed out to oncoming ED provider. I ordered medication including IV fluids for hydration; Reglan for nausea Reevaluation of the patient after these medicines showed that the patient improved I have reviewed the patients home medicines and have made adjustments as needed   Social Determinants of Health:  Lives independently         Final Clinical Impression(s) / ED Diagnoses Final diagnoses:  Right lower quadrant abdominal pain    Rx / DC Orders ED Discharge Orders     None         Gloris Manchester, MD 01/08/23 [phone]

## 2023-01-08 NOTE — ED Triage Notes (Signed)
Pt with abd pain last night.  Took some gas X earlier today and then vomited.  Pt has taken miralax and had BM today.  Pt then states he became shaky.  Pt states had some tremors back in June this year.

## 2023-01-08 NOTE — ED Provider Notes (Signed)
CT shows evidence of a recently passed right ureteral stone.  That with it along with patient's discomfort on that side.  Urinalysis here today nitrite is positive does have greater than 50 RBCs greater than 50 white blood cells rare bacteria.  Urine sent for culture.  Patient has a longstanding indwelling Foley catheter.  But we will go ahead and treat with antibiotics Keflex for presumed UTI.  Patient is already followed by urology.  Will have him continue to follow.  Patient's potassium up a little bit at 5.2 but renal function not significantly changed from baseline for him.  In follow back up with his primary care doctor to have potassium rechecked.  Patient was treated with [facility] here.   Vanetta Mulders, MD 01/08/23 [phone]

## 2023-01-09 ENCOUNTER — Ambulatory Visit: Payer: Medicare Other

## 2023-01-09 DIAGNOSIS — H409 Unspecified glaucoma: Secondary | ICD-10-CM | POA: Diagnosis not present

## 2023-01-09 DIAGNOSIS — H9190 Unspecified hearing loss, unspecified ear: Secondary | ICD-10-CM

## 2023-01-09 DIAGNOSIS — M25561 Pain in right knee: Secondary | ICD-10-CM | POA: Diagnosis not present

## 2023-01-09 DIAGNOSIS — J449 Chronic obstructive pulmonary disease, unspecified: Secondary | ICD-10-CM

## 2023-01-09 DIAGNOSIS — K219 Gastro-esophageal reflux disease without esophagitis: Secondary | ICD-10-CM

## 2023-01-09 DIAGNOSIS — N1330 Unspecified hydronephrosis: Secondary | ICD-10-CM | POA: Diagnosis not present

## 2023-01-09 DIAGNOSIS — N179 Acute kidney failure, unspecified: Secondary | ICD-10-CM | POA: Diagnosis not present

## 2023-01-09 DIAGNOSIS — I4891 Unspecified atrial fibrillation: Secondary | ICD-10-CM

## 2023-01-09 DIAGNOSIS — N400 Enlarged prostate without lower urinary tract symptoms: Secondary | ICD-10-CM

## 2023-01-09 DIAGNOSIS — Z466 Encounter for fitting and adjustment of urinary device: Secondary | ICD-10-CM

## 2023-01-09 DIAGNOSIS — J479 Bronchiectasis, uncomplicated: Secondary | ICD-10-CM

## 2023-01-09 DIAGNOSIS — N1832 Chronic kidney disease, stage 3b: Secondary | ICD-10-CM | POA: Diagnosis not present

## 2023-01-10 ENCOUNTER — Encounter: Payer: Self-pay | Admitting: Family Medicine

## 2023-01-10 ENCOUNTER — Ambulatory Visit (INDEPENDENT_AMBULATORY_CARE_PROVIDER_SITE_OTHER): Payer: Medicare Other | Admitting: Family Medicine

## 2023-01-10 VITALS — BP 87/44 | HR 91 | Temp 97.7°F | Ht 70.0 in | Wt 148.2 lb

## 2023-01-10 DIAGNOSIS — R110 Nausea: Secondary | ICD-10-CM | POA: Diagnosis not present

## 2023-01-10 DIAGNOSIS — N12 Tubulo-interstitial nephritis, not specified as acute or chronic: Secondary | ICD-10-CM | POA: Diagnosis not present

## 2023-01-10 MED ORDER — CEFTRIAXONE SODIUM 1 G IJ SOLR
1.0000 g | Freq: Once | INTRAMUSCULAR | Status: DC
Start: 2023-01-10 — End: 2023-01-10

## 2023-01-10 MED ORDER — ONDANSETRON 4 MG PO TBDP
4.0000 mg | ORAL_TABLET | Freq: Once | ORAL | Status: AC
Start: 2023-01-10 — End: 2023-01-10
  Administered 2023-01-10: 4 mg via ORAL

## 2023-01-10 MED ORDER — CEFTRIAXONE SODIUM 1 G IJ SOLR
1.0000 g | Freq: Once | INTRAMUSCULAR | Status: AC
Start: 2023-01-10 — End: 2023-01-10
  Administered 2023-01-10: 1 g via INTRAMUSCULAR

## 2023-01-10 NOTE — Progress Notes (Signed)
Subjective:  Patient ID: Charles Golden, male    DOB: [birth year]  Age: 89 y.o. MRN: [id number]  CC: ER follow up and Nephrolithiasis   HPI KOKI PILLADO presents for obstructive uropathy. Wearing coude cath. On Abx. Feeling nausea and dizziness currently. Temp spike to 102 this AM. Potassium was high at E.D. took 3 doses of Lokelma. Due recheck of  potassium. At ED had urine culture done. Preliminary showed gram negative rods. Awaiting sensitivities     01/10/2023   10:40 AM 12/14/2022   11:28 AM 11/20/2022    2:23 PM  Depression screen PHQ 2/9  Decreased Interest 0 0 0  Down, Depressed, Hopeless 0 0 0  PHQ - 2 Score 0 0 0  Altered sleeping  0 0  Tired, decreased energy  0 3  Change in appetite  0 0  Feeling bad or failure about yourself   0 0  Trouble concentrating  0 0  Moving slowly or fidgety/restless  0 0  Suicidal thoughts  0 0  PHQ-9 Score  0 3  Difficult doing work/chores  Not difficult at all Somewhat difficult    History Blanch has a past medical history of Acute medial meniscal tear (07/20/2014), Atrial fibrillation with RVR (HCC) (05/10/2021), Chronic headache (08/05/2013), COPD (chronic obstructive pulmonary disease) (HCC), CVA (cerebral vascular accident) (HCC) (05/01/2021), Enlarged prostate, GERD (gastroesophageal reflux disease), Glaucoma, Glucagonoma, Hernia, incisional, HOH (hard of hearing), IBS (irritable bowel syndrome), Incisional hernia, without obstruction or gangrene (08/05/2013), Sciatic pain, and Stage 3b chronic kidney disease (CKD) (HCC).   He has a past surgical history that includes Hemorrhoid surgery; laparotomy (N/A, 09/04/2012); Bowel resection (N/A, 09/04/2012); Inguinal hernia repair (Left, 09/14/2013); Insertion of mesh (Left, 09/14/2013); Tonsillectomy; Knee arthroscopy (Left, 07/21/2014); Skin lesion excision; Hernia repair (Bilateral, 70's); Inguinal hernia repair (Right, 12/23/2018); Radiology with anesthesia (Left, 05/04/2021); IR US Guide Vasc Access  Right (05/04/2021); IR INTRAVSC STENT CERV CAROTID W/EMB-PROT MOD SED (05/04/2021); and IR ANGIO INTRA EXTRACRAN SEL INTERNAL CAROTID UNI L MOD SED (05/04/2021).   His family history includes Asthma in an other family member; Colon cancer in his father; Healthy in his daughter, daughter, and daughter; Heart disease in his mother.He reports that he quit smoking about 16 years ago. His smoking use included cigarettes. He started smoking about 46 years ago. He has a 30 pack-year smoking history. He has never used smokeless tobacco. He reports that he does not currently use alcohol. He reports that he does not use drugs.    ROS Review of Systems  Constitutional:  Positive for activity change, appetite change, fatigue and fever.  Respiratory:  Negative for shortness of breath.   Cardiovascular:  Negative for chest pain.  Gastrointestinal:  Negative for abdominal pain.  Musculoskeletal:  Negative for arthralgias.  Skin:  Negative for rash.  Neurological:  Positive for weakness.    Objective:  BP (!) 87/44   Pulse 91   Temp 97.7 F (36.5 C)   Ht 5\' 10"  (1.778 m)   Wt 148 lb 3.2 oz (67.2 kg)   SpO2 95%   BMI 21.26 kg/m   BP Readings from Last 3 Encounters:  01/11/23 118/69  01/10/23 (!) 87/44  01/08/23 (!) 145/73    Wt Readings from Last 3 Encounters:  01/10/23 148 lb 3.2 oz (67.2 kg)  01/08/23 147 lb (66.7 kg)  12/21/22 162 lb 11.2 oz (73.8 kg)     Physical Exam Vitals reviewed.  Constitutional:      General: He is  not in acute distress.    Appearance: He is well-developed. He is ill-appearing. He is not toxic-appearing.  HENT:     Head: Normocephalic and atraumatic.     Right Ear: External ear normal.     Left Ear: External ear normal.     Mouth/Throat:     Pharynx: No oropharyngeal exudate or posterior oropharyngeal erythema.  Eyes:     Pupils: Pupils are equal, round, and reactive to light.  Cardiovascular:     Rate and Rhythm: Normal rate and regular rhythm.      Heart sounds: No murmur heard. Pulmonary:     Effort: No respiratory distress.     Breath sounds: Normal breath sounds.  Abdominal:     Palpations: Abdomen is soft. There is no mass.     Tenderness: There is no abdominal tenderness.  Musculoskeletal:        General: Normal range of motion.     Cervical back: Normal range of motion and neck supple.  Neurological:     Mental Status: He is alert and oriented to person, place, and time.       Assessment & Plan:   Taquan was seen today for er follow up and nephrolithiasis.  Diagnoses and all orders for this visit:  Pyelonephritis -     Discontinue: cefTRIAXone (ROCEPHIN) injection 1 g -     CBC with Differential/Platelet -     CMP14+EGFR -     cefTRIAXone (ROCEPHIN) injection 1 g  Nausea -     ondansetron (ZOFRAN-ODT) disintegrating tablet 4 mg       I am having Sharon Seller R. Diallo maintain his timolol, finasteride, silodosin, aspirin EC, CRANBERRY PO, fluticasone, atorvastatin, apixaban, diclofenac Sodium, Trelegy Ellipta, oxyCODONE-acetaminophen, azelastine, lubiprostone, Ventolin HFA, Cyanocobalamin (VITAMIN B-12 PO), acetaminophen, docusate sodium, vitamin D3, latanoprost, gabapentin, ondansetron, mirtazapine, cephALEXin, and Lokelma. We administered cefTRIAXone and ondansetron.  Allergies as of 01/10/2023       Reactions   Demerol [meperidine] Other (See Comments)   Pt. States "he woke up during a colonoscopy"   Spiriva Handihaler [tiotropium Bromide Monohydrate] Other (See Comments)   Mild Urinary Retention        Medication List        Accurate as of January 10, 2023 11:59 PM. If you have any questions, ask your nurse or doctor.          acetaminophen 500 MG tablet Commonly known as: TYLENOL Take 1,000 mg by mouth daily as needed for headache or moderate pain.   aspirin EC 81 MG tablet Take 1 tablet (81 mg total) by mouth daily. Swallow whole.   atorvastatin 40 MG tablet Commonly known as: LIPITOR Take 1  tablet (40 mg total) by mouth daily.   azelastine 0.1 % nasal spray Commonly known as: ASTELIN PLACE 1 SPRAY IN EACH NOSTRIL ONCE A DAY AS DIRECTED What changed:  how much to take how to take this when to take this additional instructions   cephALEXin 500 MG capsule Commonly known as: KEFLEX Take 1 capsule (500 mg total) by mouth 4 (four) times daily.   CRANBERRY PO Take 1 tablet by mouth every evening.   diclofenac Sodium 1 % Gel Commonly known as: Voltaren Apply 2 g topically 4 (four) times daily as needed (arthritis).   docusate sodium 100 MG capsule Commonly known as: COLACE Take 1 capsule (100 mg total) by mouth every evening.   Eliquis 2.5 MG Tabs tablet Generic drug: apixaban Take 1 tablet (2.5 mg total) by  mouth 2 (two) times daily.   finasteride 5 MG tablet Commonly known as: PROSCAR Take 5 mg by mouth daily.   fluticasone 50 MCG/ACT nasal spray Commonly known as: FLONASE Place 2 sprays into both nostrils daily. What changed: when to take this   gabapentin 300 MG capsule Commonly known as: NEURONTIN Take 300 mg by mouth at bedtime.   latanoprost 0.005 % ophthalmic solution Commonly known as: XALATAN Place 1 drop into both eyes at bedtime.   Lokelma 10 g Pack packet Generic drug: sodium zirconium cyclosilicate Take 10 g by mouth 2 (two) times daily.   lubiprostone 24 MCG capsule Commonly known as: AMITIZA Take 1 capsule (24 mcg total) by mouth 2 (two) times daily with a meal. For constipation   mirtazapine 7.5 MG tablet Commonly known as: REMERON Take 1 tablet (7.5 mg total) by mouth at bedtime.   ondansetron 4 MG disintegrating tablet Commonly known as: ZOFRAN-ODT Take 1 tablet (4 mg total) by mouth every 8 (eight) hours as needed for nausea or vomiting.   oxyCODONE-acetaminophen 5-325 MG tablet Commonly known as: PERCOCET/ROXICET Take 0.5-1 tablets by mouth every 8 (eight) hours as needed for severe pain (USE ONLY IF NEEDED).   silodosin 8  MG Caps capsule Commonly known as: RAPAFLO Take 8 mg by mouth daily.   timolol 0.5 % ophthalmic solution Commonly known as: TIMOPTIC Place 1 drop into the left eye every morning.   Trelegy Ellipta 100-62.5-25 MCG/ACT Aepb Generic drug: Fluticasone-Umeclidin-Vilant Inhale 1 puff into the lungs every evening. What changed: when to take this   Ventolin HFA 108 (90 Base) MCG/ACT inhaler Generic drug: albuterol INHALE 2 PUFFS EVERY 6 HOURS AS NEEDED FOR WHEEZING OR SHORTNESS OF BREATH   VITAMIN B-12 PO Take 1 tablet by mouth every evening.   vitamin D3 25 MCG tablet Commonly known as: CHOLECALCIFEROL Take 1 tablet (1,000 Units total) by mouth every evening.         Follow-up: No follow-ups on file.  Mechele Claude, M.D.

## 2023-01-11 ENCOUNTER — Inpatient Hospital Stay (HOSPITAL_COMMUNITY)
Admission: EM | Admit: 2023-01-11 | Discharge: 2023-01-16 | DRG: 699 | Disposition: A | Discharge status: Home Health | Payer: Medicare Other | Attending: Internal Medicine | Admitting: Internal Medicine

## 2023-01-11 ENCOUNTER — Other Ambulatory Visit: Payer: Self-pay

## 2023-01-11 ENCOUNTER — Telehealth (HOSPITAL_BASED_OUTPATIENT_CLINIC_OR_DEPARTMENT_OTHER): Payer: Self-pay | Admitting: *Deleted

## 2023-01-11 ENCOUNTER — Encounter (HOSPITAL_COMMUNITY): Payer: Self-pay

## 2023-01-11 ENCOUNTER — Telehealth: Payer: Self-pay | Admitting: *Deleted

## 2023-01-11 DIAGNOSIS — N401 Enlarged prostate with lower urinary tract symptoms: Secondary | ICD-10-CM | POA: Diagnosis present

## 2023-01-11 DIAGNOSIS — Y846 Urinary catheterization as the cause of abnormal reaction of the patient, or of later complication, without mention of misadventure at the time of the procedure: Secondary | ICD-10-CM | POA: Diagnosis present

## 2023-01-11 DIAGNOSIS — N179 Acute kidney failure, unspecified: Secondary | ICD-10-CM | POA: Diagnosis present

## 2023-01-11 DIAGNOSIS — Z885 Allergy status to narcotic agent status: Secondary | ICD-10-CM

## 2023-01-11 DIAGNOSIS — I480 Paroxysmal atrial fibrillation: Secondary | ICD-10-CM | POA: Diagnosis present

## 2023-01-11 DIAGNOSIS — Z9079 Acquired absence of other genital organ(s): Secondary | ICD-10-CM

## 2023-01-11 DIAGNOSIS — K219 Gastro-esophageal reflux disease without esophagitis: Secondary | ICD-10-CM | POA: Diagnosis present

## 2023-01-11 DIAGNOSIS — G2581 Restless legs syndrome: Secondary | ICD-10-CM | POA: Diagnosis present

## 2023-01-11 DIAGNOSIS — Z95828 Presence of other vascular implants and grafts: Secondary | ICD-10-CM

## 2023-01-11 DIAGNOSIS — I5030 Unspecified diastolic (congestive) heart failure: Secondary | ICD-10-CM | POA: Diagnosis present

## 2023-01-11 DIAGNOSIS — D649 Anemia, unspecified: Secondary | ICD-10-CM | POA: Diagnosis not present

## 2023-01-11 DIAGNOSIS — I509 Heart failure, unspecified: Secondary | ICD-10-CM | POA: Diagnosis present

## 2023-01-11 DIAGNOSIS — R351 Nocturia: Secondary | ICD-10-CM | POA: Diagnosis not present

## 2023-01-11 DIAGNOSIS — Z8673 Personal history of transient ischemic attack (TIA), and cerebral infarction without residual deficits: Secondary | ICD-10-CM | POA: Diagnosis not present

## 2023-01-11 DIAGNOSIS — R110 Nausea: Secondary | ICD-10-CM | POA: Diagnosis not present

## 2023-01-11 DIAGNOSIS — T83511A Infection and inflammatory reaction due to indwelling urethral catheter, initial encounter: Secondary | ICD-10-CM | POA: Diagnosis present

## 2023-01-11 DIAGNOSIS — N390 Urinary tract infection, site not specified: Secondary | ICD-10-CM | POA: Diagnosis present

## 2023-01-11 DIAGNOSIS — H409 Unspecified glaucoma: Secondary | ICD-10-CM | POA: Diagnosis present

## 2023-01-11 DIAGNOSIS — R5381 Other malaise: Secondary | ICD-10-CM | POA: Diagnosis present

## 2023-01-11 DIAGNOSIS — Z87442 Personal history of urinary calculi: Secondary | ICD-10-CM

## 2023-01-11 DIAGNOSIS — D631 Anemia in chronic kidney disease: Secondary | ICD-10-CM | POA: Diagnosis present

## 2023-01-11 DIAGNOSIS — H9193 Unspecified hearing loss, bilateral: Secondary | ICD-10-CM | POA: Diagnosis present

## 2023-01-11 DIAGNOSIS — I5032 Chronic diastolic (congestive) heart failure: Secondary | ICD-10-CM

## 2023-01-11 DIAGNOSIS — Z7901 Long term (current) use of anticoagulants: Secondary | ICD-10-CM

## 2023-01-11 DIAGNOSIS — I130 Hypertensive heart and chronic kidney disease with heart failure and stage 1 through stage 4 chronic kidney disease, or unspecified chronic kidney disease: Secondary | ICD-10-CM | POA: Diagnosis present

## 2023-01-11 DIAGNOSIS — Z79899 Other long term (current) drug therapy: Secondary | ICD-10-CM

## 2023-01-11 DIAGNOSIS — Z7982 Long term (current) use of aspirin: Secondary | ICD-10-CM

## 2023-01-11 DIAGNOSIS — N1832 Chronic kidney disease, stage 3b: Secondary | ICD-10-CM | POA: Diagnosis present

## 2023-01-11 DIAGNOSIS — Z87891 Personal history of nicotine dependence: Secondary | ICD-10-CM

## 2023-01-11 DIAGNOSIS — E785 Hyperlipidemia, unspecified: Secondary | ICD-10-CM | POA: Diagnosis present

## 2023-01-11 DIAGNOSIS — E860 Dehydration: Secondary | ICD-10-CM | POA: Diagnosis present

## 2023-01-11 DIAGNOSIS — Z888 Allergy status to other drugs, medicaments and biological substances status: Secondary | ICD-10-CM

## 2023-01-11 DIAGNOSIS — Z974 Presence of external hearing-aid: Secondary | ICD-10-CM

## 2023-01-11 DIAGNOSIS — J449 Chronic obstructive pulmonary disease, unspecified: Secondary | ICD-10-CM | POA: Diagnosis present

## 2023-01-11 DIAGNOSIS — E875 Hyperkalemia: Secondary | ICD-10-CM | POA: Diagnosis present

## 2023-01-11 DIAGNOSIS — N138 Other obstructive and reflux uropathy: Secondary | ICD-10-CM | POA: Diagnosis present

## 2023-01-11 DIAGNOSIS — N400 Enlarged prostate without lower urinary tract symptoms: Secondary | ICD-10-CM | POA: Diagnosis present

## 2023-01-11 DIAGNOSIS — N189 Chronic kidney disease, unspecified: Secondary | ICD-10-CM

## 2023-01-11 DIAGNOSIS — K581 Irritable bowel syndrome with constipation: Secondary | ICD-10-CM | POA: Diagnosis present

## 2023-01-11 DIAGNOSIS — Z8249 Family history of ischemic heart disease and other diseases of the circulatory system: Secondary | ICD-10-CM

## 2023-01-11 HISTORY — DX: Chronic kidney disease, unspecified: N17.9

## 2023-01-11 LAB — CMP14+EGFR
ALT: 11 IU/L (ref 0–44)
AST: 20 IU/L (ref 0–40)
Albumin: 2.8 g/dL — ABNORMAL LOW (ref 3.6–4.6)
Alkaline Phosphatase: 72 IU/L (ref 44–121)
BUN/Creatinine Ratio: 17 (ref 10–24)
BUN: 51 mg/dL — ABNORMAL HIGH (ref 10–36)
Bilirubin Total: 0.4 mg/dL (ref 0.0–1.2)
CO2: 21 mmol/L (ref 20–29)
Calcium: 8.1 mg/dL — ABNORMAL LOW (ref 8.6–10.2)
Chloride: 102 mmol/L (ref 96–106)
Creatinine, Ser: 3.01 mg/dL (ref 0.76–1.27)
Globulin, Total: 2.5 g/dL (ref 1.5–4.5)
Glucose: 108 mg/dL — ABNORMAL HIGH (ref 70–99)
Potassium: 4.2 mmol/L (ref 3.5–5.2)
Sodium: 139 mmol/L (ref 134–144)
Total Protein: 5.3 g/dL — ABNORMAL LOW (ref 6.0–8.5)
eGFR: 18 mL/min/{1.73_m2} — ABNORMAL LOW (ref >59)

## 2023-01-11 LAB — COMPREHENSIVE METABOLIC PANEL
ALT: 16 U/L (ref 0–44)
AST: 28 U/L (ref 15–41)
Albumin: 2.2 g/dL — ABNORMAL LOW (ref 3.5–5.0)
Alkaline Phosphatase: 58 U/L (ref 38–126)
Anion gap: 15 (ref 5–15)
BUN: 59 mg/dL — ABNORMAL HIGH (ref 8–23)
CO2: 21 mmol/L — ABNORMAL LOW (ref 22–32)
Calcium: 8.3 mg/dL — ABNORMAL LOW (ref 8.9–10.3)
Chloride: 104 mmol/L (ref 98–111)
Creatinine, Ser: 3.06 mg/dL — ABNORMAL HIGH (ref 0.61–1.24)
GFR, Estimated: 18 mL/min — ABNORMAL LOW (ref >60)
Glucose, Bld: 137 mg/dL — ABNORMAL HIGH (ref 70–99)
Potassium: 4 mmol/L (ref 3.5–5.1)
Sodium: 140 mmol/L (ref 135–145)
Total Bilirubin: 0.5 mg/dL (ref 0.3–1.2)
Total Protein: 5.1 g/dL — ABNORMAL LOW (ref 6.5–8.1)

## 2023-01-11 LAB — CBC WITH DIFFERENTIAL/PLATELET
Basophils Absolute: 0 10*3/uL (ref 0.0–0.2)
Basos: 0 %
EOS (ABSOLUTE): 0 10*3/uL (ref 0.0–0.4)
Eos: 0 %
Hematocrit: 30.9 % — ABNORMAL LOW (ref 37.5–51.0)
Hemoglobin: 10.1 g/dL — ABNORMAL LOW (ref 13.0–17.7)
Immature Grans (Abs): 0 10*3/uL (ref 0.0–0.1)
Immature Granulocytes: 0 %
Lymphocytes Absolute: 0.9 10*3/uL (ref 0.7–3.1)
Lymphs: 8 %
MCH: 30.7 pg (ref 26.6–33.0)
MCHC: 32.7 g/dL (ref 31.5–35.7)
MCV: 94 fL (ref 79–97)
Monocytes Absolute: 0.4 10*3/uL (ref 0.1–0.9)
Monocytes: 3 %
Neutrophils Absolute: 10.2 10*3/uL — ABNORMAL HIGH (ref 1.4–7.0)
Neutrophils: 89 %
Platelets: 204 10*3/uL (ref 150–450)
RBC: 3.29 x10E6/uL — ABNORMAL LOW (ref 4.14–5.80)
RDW: 12.8 % (ref 11.6–15.4)
WBC: 11.6 10*3/uL — ABNORMAL HIGH (ref 3.4–10.8)

## 2023-01-11 LAB — CBC
HCT: 29.4 % — ABNORMAL LOW (ref 39.0–52.0)
Hemoglobin: 9.1 g/dL — ABNORMAL LOW (ref 13.0–17.0)
MCH: 29.9 pg (ref 26.0–34.0)
MCHC: 31 g/dL (ref 30.0–36.0)
MCV: 96.7 fL (ref 80.0–100.0)
Platelets: 201 10*3/uL (ref 150–400)
RBC: 3.04 MIL/uL — ABNORMAL LOW (ref 4.22–5.81)
RDW: 14.7 % (ref 11.5–15.5)
WBC: 8.2 10*3/uL (ref 4.0–10.5)
nRBC: 0 % (ref 0.0–0.2)

## 2023-01-11 LAB — URINALYSIS, ROUTINE W REFLEX MICROSCOPIC
Bilirubin Urine: NEGATIVE
Glucose, UA: NEGATIVE mg/dL
Ketones, ur: NEGATIVE mg/dL
Nitrite: NEGATIVE
Protein, ur: 100 mg/dL — AB
RBC / HPF: >50 RBC/hpf (ref 0–5)
Specific Gravity, Urine: 1.016 (ref 1.005–1.030)
pH: 5 (ref 5.0–8.0)

## 2023-01-11 LAB — LIPASE, BLOOD: Lipase: 39 U/L (ref 11–51)

## 2023-01-11 MED ORDER — FLUTICASONE FUROATE-VILANTEROL 100-25 MCG/ACT IN AEPB
1.0000 | INHALATION_SPRAY | Freq: Every day | RESPIRATORY_TRACT | Status: DC
  Administered 2023-01-12 – 2023-01-15 (×4): 1 via RESPIRATORY_TRACT
  Filled 2023-01-11: qty 28

## 2023-01-11 MED ORDER — SODIUM CHLORIDE 0.9% FLUSH
3.0000 mL | Freq: Two times a day (BID) | INTRAVENOUS | Status: DC
  Administered 2023-01-11 – 2023-01-16 (×8): 3 mL via INTRAVENOUS

## 2023-01-11 MED ORDER — SODIUM CHLORIDE 0.9 % IV BOLUS
1000.0000 mL | Freq: Once | INTRAVENOUS | Status: AC
  Administered 2023-01-11: 1000 mL via INTRAVENOUS

## 2023-01-11 MED ORDER — UMECLIDINIUM BROMIDE 62.5 MCG/ACT IN AEPB
1.0000 | INHALATION_SPRAY | Freq: Every day | RESPIRATORY_TRACT | Status: DC
  Administered 2023-01-12 – 2023-01-15 (×4): 1 via RESPIRATORY_TRACT
  Filled 2023-01-11: qty 7

## 2023-01-11 MED ORDER — TAMSULOSIN HCL 0.4 MG PO CAPS
0.4000 mg | ORAL_CAPSULE | Freq: Every day | ORAL | Status: DC
  Administered 2023-01-11 – 2023-01-15 (×5): 0.4 mg via ORAL
  Filled 2023-01-11 (×5): qty 1

## 2023-01-11 MED ORDER — SODIUM CHLORIDE 0.9 % IV SOLN
1.0000 g | INTRAVENOUS | Status: DC
  Administered 2023-01-12 – 2023-01-16 (×5): 1 g via INTRAVENOUS
  Filled 2023-01-11 (×6): qty 10

## 2023-01-11 MED ORDER — MIRTAZAPINE 15 MG PO TABS
7.5000 mg | ORAL_TABLET | Freq: Every day | ORAL | Status: DC
  Administered 2023-01-11 – 2023-01-15 (×5): 7.5 mg via ORAL
  Filled 2023-01-11 (×5): qty 1

## 2023-01-11 MED ORDER — APIXABAN 2.5 MG PO TABS
2.5000 mg | ORAL_TABLET | Freq: Two times a day (BID) | ORAL | Status: DC
  Administered 2023-01-11 – 2023-01-16 (×10): 2.5 mg via ORAL
  Filled 2023-01-11 (×10): qty 1

## 2023-01-11 MED ORDER — SODIUM CHLORIDE 0.9 % IV SOLN: INTRAVENOUS | Status: DC

## 2023-01-11 MED ORDER — GABAPENTIN 300 MG PO CAPS
300.0000 mg | ORAL_CAPSULE | Freq: Every day | ORAL | Status: DC
  Administered 2023-01-11 – 2023-01-15 (×5): 300 mg via ORAL
  Filled 2023-01-11 (×5): qty 1

## 2023-01-11 MED ORDER — ASPIRIN 81 MG PO TBEC
81.0000 mg | DELAYED_RELEASE_TABLET | Freq: Every day | ORAL | Status: DC
  Administered 2023-01-11 – 2023-01-13 (×3): 81 mg via ORAL
  Filled 2023-01-11 (×3): qty 1

## 2023-01-11 MED ORDER — FINASTERIDE 5 MG PO TABS
5.0000 mg | ORAL_TABLET | Freq: Every day | ORAL | Status: DC
  Administered 2023-01-12 – 2023-01-16 (×5): 5 mg via ORAL
  Filled 2023-01-11 (×5): qty 1

## 2023-01-11 MED ORDER — ONDANSETRON 4 MG PO TBDP
4.0000 mg | ORAL_TABLET | Freq: Once | ORAL | Status: AC | PRN
  Administered 2023-01-11: 4 mg via ORAL
  Filled 2023-01-11: qty 1

## 2023-01-11 MED ORDER — PIPERACILLIN-TAZOBACTAM 3.375 G IVPB 30 MIN
3.3750 g | Freq: Once | INTRAVENOUS | Status: AC
  Administered 2023-01-11: 3.375 g via INTRAVENOUS
  Filled 2023-01-11: qty 50

## 2023-01-11 MED ORDER — ATORVASTATIN CALCIUM 40 MG PO TABS
40.0000 mg | ORAL_TABLET | Freq: Every day | ORAL | Status: DC
  Administered 2023-01-12 – 2023-01-16 (×5): 40 mg via ORAL
  Filled 2023-01-11 (×5): qty 1

## 2023-01-11 NOTE — Progress Notes (Signed)
ED Pharmacy Antibiotic Sign Off An antibiotic consult was received from an ED provider for zosyn per pharmacy dosing for concern for intra-abominal infection. A chart review was completed to assess appropriateness.   The following one time order(s) were placed:  Piperacillin/tazobactam 3.375 g IV x 1   Further antibiotic and/or antibiotic pharmacy consults should be ordered by the admitting provider if indicated.   Thank you for allowing pharmacy to be a part of this patient's care.   Griffin Dakin, [facility]  Clinical Pharmacist 01/11/23 4:23 PM

## 2023-01-11 NOTE — Telephone Encounter (Signed)
Post ED Visit - Positive Culture Follow-up: Successful Patient Follow-Up  Culture assessed and recommendations reviewed by:  [x]  Enzo Bi, Pharm.D. []  Celedonio Miyamoto, Pharm.D., BCPS AQ-ID []  Garvin Fila, Pharm.D., BCPS []  Georgina Pillion, Pharm.D., BCPS []  [place], [address].D., BCPS, AAHIVP []  Estella Husk, Pharm.D., BCPS, AAHIVP []  Lysle Pearl, PharmD, BCPS []  Phillips Climes, PharmD, BCPS []  Agapito Games, PharmD, BCPS []  Verlan Friends, PharmD  Positive urine culture  []  Patient discharged without antimicrobial prescription and treatment is now indicated [x]  Organism is resistant to prescribed ED discharge antimicrobial []  Patient with positive blood cultures  Changes discussed with ED provider: Ralph Leyden, PA Continued symptoms.  New antibiotic prescription Stop Keflex.  Start Ciprofloxacin 500mg  PO daily x 7 days. Called to [facility] [phone]  Contacted patient, date 01/11/2023, time 1000   Lysle Pearl 01/11/2023, 10:03 AM

## 2023-01-11 NOTE — H&P (Addendum)
History and Physical    Patient: Charles Golden XBJ:[id number] DOB: Oct 10, 1925 DOA: 01/11/2023 DOS: the patient was seen and examined on 01/11/2023 PCP: [name], DO  Patient coming from: Home  Chief Complaint:  Chief Complaint  Patient presents with   Abnormal Labs   HPI: Charles Golden is a 89 y.o. male with medical history significant of atrial fibrillation on Eliquis, CVA, COPD, CKD stage 3b, nephrolithiasis, BPH, and GERD who presented for abnormal labs.  At baseline patient lives at home and ambulates with walker.  He reports that over the last 2 days he has not eaten much of anything due to complaints of nausea and decreased appetite.  He reports having nausea for the last week.  His daughter notes that he had fever up to 17 F yesterday at home and patient reports feeling globally weak.  He suffers from chronic constipation, but recently had bowel movement the other day.  Denies having any significant vomiting.  He was seen in the any pain emergency department 3 days ago with a right flank pain found to have a 5 mm recently passed right renal calculus in the urinary bladder versus right UVJ on CT scan.  Urine culture grew greater than 100,000 colonies of Serratia marcescens with resistance to Macrobid.  He had been started on cephalexin.  He was seen by his primary care provider yesterday and given Rocephin IV IV x 1 g and lab work have been obtained.  Is noted to have creatinine elevated up to 3.01 with BUN 51 for which they advised him to come to the emergency department for further evaluation.  Family notes that the patient has had the Foley catheter in place since July due to bladder obstruction.  In the emergency department patient was noted to pressures 87/44 to 137/103.  Labs significant for hemoglobin 9.1, BUN 59, creatinine 3.06, and calcium 8.3. Urinalysis significant for large hemoglobin, moderate leukocytes, rare bacteria, greater than 50 RBCs/hpf, and 21-50 WBCs.   Patient had been given 1 L of IV fluids and started on empiric antibiotics of Zosyn.   Review of Systems: As mentioned in the history of present illness. All other systems reviewed and are negative. Past Medical History:  Diagnosis Date   Acute medial meniscal tear 07/20/2014   Atrial fibrillation with RVR (HCC) 05/10/2021   Chronic headache 08/05/2013   COPD (chronic obstructive pulmonary disease) (HCC)    CVA (cerebral vascular accident) (HCC) 05/01/2021   Enlarged prostate    GERD (gastroesophageal reflux disease)    Glaucoma    Glucagonoma    Hernia, incisional    at present   [facility] (hard of hearing)    IBS (irritable bowel syndrome)    Incisional hernia, without obstruction or gangrene 08/05/2013   Sciatic pain    Stage 3b chronic kidney disease (CKD) (HCC)    Past Surgical History:  Procedure Laterality Date   BOWEL RESECTION N/A 09/04/2012   Procedure: SMALL BOWEL RESECTION;  Surgeon: Fabio Bering, MD;  Location: [facility];  Service: General;  Laterality: N/A;  Anastimosis   HEMORRHOID SURGERY     HERNIA REPAIR Bilateral 70's   INGUINAL HERNIA REPAIR Left 09/14/2013   Procedure: LEFT INGUINAL HERNIORRHAPHY;  Surgeon: Dalia Heading, MD;  Location: [facility];  Service: General;  Laterality: Left;   INGUINAL HERNIA REPAIR Right 12/23/2018   Procedure: RECURRENT RIGHT INGUINAL HERNIA  REPAIR  WITH MESH;  Surgeon: Franky Macho, MD;  Location: [facility];  Service: General;  Laterality: Right;   INSERTION OF MESH Left 09/14/2013   Procedure: INSERTION OF MESH;  Surgeon: Dalia Heading, MD;  Location: [facility];  Service: General;  Laterality: Left;   IR ANGIO INTRA EXTRACRAN SEL INTERNAL CAROTID UNI L MOD SED  05/04/2021   IR INTRAVSC STENT CERV CAROTID W/EMB-PROT MOD SED INCL ANGIO  05/04/2021   IR US GUIDE VASC ACCESS RIGHT  05/04/2021   KNEE ARTHROSCOPY Left 07/21/2014   Procedure: LEFT KNEE ARTHROSCOPY WITH MEDIAL MENISCAL DEBRIDEMENT ;  Surgeon: Loanne Drilling, MD;  Location: [facility];   Service: Orthopedics;  Laterality: Left;   LAPAROTOMY N/A 09/04/2012   Procedure: EXPLORATORY LAPAROTOMY;  Surgeon: Fabio Bering, MD;  Location: [facility];  Service: General;  Laterality: N/A;   RADIOLOGY WITH ANESTHESIA Left 05/04/2021   Procedure: LEFT ICA STENT;  Surgeon: Baldemar Lenis, MD;  Location: [facility];  Service: Radiology;  Laterality: Left;   SKIN LESION EXCISION     Dr Lazarus Salines   TONSILLECTOMY     Social History:  reports that he quit smoking about 16 years ago. His smoking use included cigarettes. He started smoking about 46 years ago. He has a 30 pack-year smoking history. He has never used smokeless tobacco. He reports that he does not currently use alcohol. He reports that he does not use drugs.  Allergies  Allergen Reactions   Demerol [Meperidine] Other (See Comments)    Pt. States "he woke up during a colonoscopy"   Spiriva Handihaler [Tiotropium Bromide Monohydrate] Other (See Comments)    Mild Urinary Retention    Family History  Problem Relation Age of Onset   Heart disease Mother        Valve replacement and pacemaker   Colon cancer Father    Asthma Other        cousin   Healthy Daughter    Healthy Daughter    Healthy Daughter     Prior to Admission medications   Medication Sig Start Date End Date Taking? Authorizing Provider  acetaminophen (TYLENOL) 500 MG tablet Take 1,000 mg by mouth daily as needed for headache or moderate pain.    [provider]  apixaban (ELIQUIS) 2.5 MG TABS tablet Take 1 tablet (2.5 mg total) by mouth 2 (two) times daily. 01/24/22   [name], DO  aspirin EC 81 MG tablet Take 1 tablet (81 mg total) by mouth daily. Swallow whole. 08/03/21   Micki Riley, MD  atorvastatin (LIPITOR) 40 MG tablet Take 1 tablet (40 mg total) by mouth daily. 01/24/22   [name], DO  azelastine (ASTELIN) 0.1 % nasal spray PLACE 1 SPRAY IN EACH NOSTRIL ONCE A DAY AS DIRECTED Patient taking differently: Place 1 spray  into both nostrils at bedtime. 11/27/22   [name], DO  cephALEXin (KEFLEX) 500 MG capsule Take 1 capsule (500 mg total) by mouth 4 (four) times daily. 01/08/23   Vanetta Mulders, MD  cholecalciferol (CHOLECALCIFEROL) 25 MCG tablet Take 1 tablet (1,000 Units total) by mouth every evening. 12/18/22   Azucena Fallen, MD  CRANBERRY PO Take 1 tablet by mouth every evening.    [provider]  Cyanocobalamin (VITAMIN B-12 PO) Take 1 tablet by mouth every evening.    [provider]  diclofenac Sodium (VOLTAREN) 1 % GEL Apply 2 g topically 4 (four) times daily as needed (arthritis). 08/08/22   [name], DO  docusate sodium (COLACE) 100 MG capsule Take 1 capsule (100  mg total) by mouth every evening. 12/18/22   Azucena Fallen, MD  finasteride (PROSCAR) 5 MG tablet Take 5 mg by mouth daily.    [provider]  fluticasone (FLONASE) 50 MCG/ACT nasal spray Place 2 sprays into both nostrils daily. Patient taking differently: Place 2 sprays into both nostrils at bedtime. 01/03/22   [name], DO  Fluticasone-Umeclidin-Vilant (TRELEGY ELLIPTA) 100-62.5-25 MCG/ACT AEPB Inhale 1 puff into the lungs every evening. Patient taking differently: Inhale 1 puff into the lungs daily. 09/17/22   Coralyn Helling, MD  gabapentin (NEURONTIN) 300 MG capsule Take 300 mg by mouth at bedtime.    [provider]  latanoprost (XALATAN) 0.005 % ophthalmic solution Place 1 drop into both eyes at bedtime. 12/18/22   Azucena Fallen, MD  lubiprostone (AMITIZA) 24 MCG capsule Take 1 capsule (24 mcg total) by mouth 2 (two) times daily with a meal. For constipation 12/11/22   Delynn Flavin M, DO  mirtazapine (REMERON) 7.5 MG tablet Take 1 tablet (7.5 mg total) by mouth at bedtime. 12/31/22   [name], DO  ondansetron (ZOFRAN-ODT) 4 MG disintegrating tablet Take 1 tablet (4 mg total) by mouth every 8 (eight) hours as needed for nausea or vomiting. 12/20/22    Dettinger, Elige Radon, MD  oxyCODONE-acetaminophen (PERCOCET/ROXICET) 5-325 MG tablet Take 0.5-1 tablets by mouth every 8 (eight) hours as needed for severe pain (USE ONLY IF NEEDED). 11/20/22   [name], DO  silodosin (RAPAFLO) 8 MG CAPS capsule Take 8 mg by mouth daily. 06/02/21   [provider]  sodium zirconium cyclosilicate (LOKELMA) 10 g PACK packet Take 10 g by mouth 2 (two) times daily. 01/08/23   Vanetta Mulders, MD  timolol (TIMOPTIC) 0.5 % ophthalmic solution Place 1 drop into the left eye every morning. 11/24/18   [provider]  VENTOLIN HFA 108 (90 Base) MCG/ACT inhaler INHALE 2 PUFFS EVERY 6 HOURS AS NEEDED FOR WHEEZING OR SHORTNESS OF BREATH 12/12/22   [name], DO    Physical Exam: Vitals:   01/11/23 1530 01/11/23 1534 01/11/23 1545 01/11/23 1600  BP: (!) 98/42  100/69 (!) 91/59  Pulse:   72 66  Resp:   16 17  Temp:  (!) 97.3 F (36.3 C)    TempSrc:  Oral    SpO2:   98% 98%   Constitutional: Elderly male currently in no acute distress Eyes: PERRL, lids and conjunctivae normal ENMT: Mucous membranes are moist.  Patient hard of hearing despite hearing aids in place Neck: normal, supple Respiratory: clear to auscultation bilaterally, no wheezing, no crackles. Normal respiratory effort. No accessory muscle use.  Cardiovascular: Regular rate and rhythm, no murmurs / rubs / gallops. No extremity edema. 2+ pedal pulses.  Abdomen: no tenderness, no masses palpated. Bowel sounds positive.  Foley catheter in place draining yellow urine. Musculoskeletal: no clubbing / cyanosis. No joint deformity upper and lower extremities. Good ROM, no contractures. Normal muscle tone.  Skin: no rashes, lesions, ulcers. No induration Neurologic: CN 2-12 grossly intact.  Patient able to move all extremities.  Psychiatric: Normal judgment and insight. Alert and oriented x 3. Normal mood.   Data Reviewed:  Reviewed labs, imaging, pertinent records as  documented.  Assessment and Plan:  Acute kidney injury superimposed on chronic kidney disease stage IIIb Creatinine elevated up to 3.06 with BUN 59.  Patient had not been eating or drinking much for the last couple days with reports of decreased appetite and nausea.  Creatinine had been 1.7 on 8/13.  Patient was ordered 1 L of IV fluids. -Admit to a telemetry bed -Strict I&Os -Continue normal saline IV fluids at 50 mL/h overnight -Recheck kidney function in a.m.  Complicated urinary tract infection Acute.  Patient has had a chronic indwelling Foley since July.  Urine culture from 3 days ago grew out Serratia marcesscens.  Patient had been given cephalexin and received a dose of Rocephin IV yesterday.  Urinalysis still noted moderate leukocytes, rare bacteria, and 21-50 WBCs.  Initially given Zosyn. -Orders placed to change Foley catheter -Changed antibiotics to Rocephin IV  Weakness/ Debility At baseline patient gets around with the use of a walker, but over the last couple of days have been feeling more weak. -Physical therapy to evaluate  Normocytic anemia Hemoglobin 9.1 which appears to be possibly 2 g lower than when checked on 9/13.  Patient denies any reports of bleeding.  -Check stool guaiac -Recheck H&H in a.m.  Heart failure with preserved EF Patient does not appear grossly fluid overloaded at this time.  Last available echocardiogram noted EF to be 60 to 65% with grade 2 diastolic dysfunction back in 2022.    -Monitor intake and output -Daily weights  Paroxysmal atrial fibrillation on chronic anticoagulation Patient appears to be in a sinus rhythm. -Continue Eliquis  History of CVA Carotid artery stenosis status post ICA stent Patient with prior history of CVAs back in 04/2021 for which she underwent left carotid angioplasty and stenting by interventional radiology. -Continue aspirin and Eliquis  COPD, without acute exacerbation -Continue pharmacy substitution of  Trelegy -Albuterol l nebs as needed for shortness of breath/wheezing  Hyperlipidemia -Continue atorvastatin  BPH with urinary obstruction -Foley catheter -Continue finasteride and pharmacy substitution of tamsulosin   DVT prophylaxis: Eliquis Advance Care Planning:   Code Status: Full Code   Consults: None  Family Communication: Family updated at bedside  Severity of Illness: The appropriate patient status for this patient is INPATIENT. Inpatient status is judged to be reasonable and necessary in order to provide the required intensity of service to ensure the patient's safety. [name] presenting symptoms, physical exam findings, and initial radiographic and laboratory data in the context of their chronic comorbidities is felt to place them at high risk for further clinical deterioration. Furthermore, it is not anticipated that the patient will be medically stable for discharge from the hospital within 2 midnights of admission.   * I certify that at the point of admission it is my clinical judgment that the patient will require inpatient hospital care spanning beyond 2 midnights from the point of admission due to high intensity of service, high risk for further deterioration and high frequency of surveillance required.*  Author: Clydie Braun, MD 01/11/2023 4:17 PM  For on call review [email or web address].

## 2023-01-11 NOTE — ED Notes (Signed)
IV attempted x2

## 2023-01-11 NOTE — ED Notes (Signed)
ED TO INPATIENT HANDOFF REPORT  ED Nurse Name and Phone #: Dahlia Client [phone]  Y Name/Age/Gender Charles Golden 89 y.o. male Room/Bed: 008C/008C  Code Status   Code Status: Prior  Home/SNF/Other Home Patient oriented to: self, place, time, and situation Is this baseline? Yes   Triage Complete: Triage complete  Chief Complaint Acute kidney injury superimposed on chronic kidney disease (HCC) [N17.9, N18.9]  Triage Note Pt came in via POV d/t being told he needed to come in for eval after his Creatinine was resulted as 3.01 & BUN of 51 (per family). Family at bedside reports he does have a kidney stone & he had a foley placed at [facility] & recently has been also having chills. A/Ox4, rates pain 4/10 in lower abd.     Allergies Allergies  Allergen Reactions   Demerol [Meperidine] Other (See Comments)    Pt. States "he woke up during a colonoscopy"   Spiriva Handihaler [Tiotropium Bromide Monohydrate] Other (See Comments)    Mild Urinary Retention    Level of Care/Admitting Diagnosis ED Disposition     ED Disposition  Admit   Condition  --   Comment  Hospital Area: [facility] [[id number]]  Level of Care: Telemetry Medical [[id number]]  May admit patient to [facility] or [facility] if equivalent level of care is available:: No  Covid Evaluation: Asymptomatic - no recent exposure (last 10 days) testing not required  Diagnosis: Acute kidney injury superimposed on chronic kidney disease [facility]) [[id number]]  Admitting Physician: Clydie Braun [[id number]]  Attending Physician: Clydie Braun [[id number]]  Certification:: I certify this patient will need inpatient services for at least 2 midnights  Expected Medical Readiness: 01/13/2023          B Medical/Surgery History Past Medical History:  Diagnosis Date   Acute medial meniscal tear 07/20/2014   Atrial fibrillation with RVR (HCC) 05/10/2021   Chronic headache 08/05/2013   COPD (chronic obstructive pulmonary  disease) (HCC)    CVA (cerebral vascular accident) (HCC) 05/01/2021   Enlarged prostate    GERD (gastroesophageal reflux disease)    Glaucoma    Glucagonoma    Hernia, incisional    at present   [facility] (hard of hearing)    IBS (irritable bowel syndrome)    Incisional hernia, without obstruction or gangrene 08/05/2013   Sciatic pain    Stage 3b chronic kidney disease (CKD) (HCC)    Past Surgical History:  Procedure Laterality Date   BOWEL RESECTION N/A 09/04/2012   Procedure: SMALL BOWEL RESECTION;  Surgeon: Fabio Bering, MD;  Location: [facility];  Service: General;  Laterality: N/A;  Anastimosis   HEMORRHOID SURGERY     HERNIA REPAIR Bilateral 70's   INGUINAL HERNIA REPAIR Left 09/14/2013   Procedure: LEFT INGUINAL HERNIORRHAPHY;  Surgeon: Dalia Heading, MD;  Location: [facility];  Service: General;  Laterality: Left;   INGUINAL HERNIA REPAIR Right 12/23/2018   Procedure: RECURRENT RIGHT INGUINAL HERNIA  REPAIR  WITH MESH;  Surgeon: Franky Macho, MD;  Location: [facility];  Service: General;  Laterality: Right;   INSERTION OF MESH Left 09/14/2013   Procedure: INSERTION OF MESH;  Surgeon: Dalia Heading, MD;  Location: [facility];  Service: General;  Laterality: Left;   IR ANGIO INTRA EXTRACRAN SEL INTERNAL CAROTID UNI L MOD SED  05/04/2021   IR INTRAVSC STENT CERV CAROTID W/EMB-PROT MOD SED INCL ANGIO  05/04/2021   IR US GUIDE VASC ACCESS RIGHT  05/04/2021  KNEE ARTHROSCOPY Left 07/21/2014   Procedure: LEFT KNEE ARTHROSCOPY WITH MEDIAL MENISCAL DEBRIDEMENT ;  Surgeon: Loanne Drilling, MD;  Location: [facility];  Service: Orthopedics;  Laterality: Left;   LAPAROTOMY N/A 09/04/2012   Procedure: EXPLORATORY LAPAROTOMY;  Surgeon: Fabio Bering, MD;  Location: [facility];  Service: General;  Laterality: N/A;   RADIOLOGY WITH ANESTHESIA Left 05/04/2021   Procedure: LEFT ICA STENT;  Surgeon: Baldemar Lenis, MD;  Location: [facility] OR;  Service: Radiology;  Laterality: Left;   SKIN LESION EXCISION     Dr  Lazarus Salines   TONSILLECTOMY       A IV Location/Drains/Wounds Patient Lines/Drains/Airways Status     Active Line/Drains/Airways     Name Placement date Placement time Site Days   Peripheral IV 01/11/23 20 G Right Antecubital 01/11/23  1627  Antecubital  less than 1   Urethral Catheter SB Coude 16 Fr. 12/21/22  1717  Coude  21            Intake/Output Last 24 hours No intake or output data in the 24 hours ending 01/11/23 1632  Labs/Imaging Results for orders placed or performed during the hospital encounter of 01/11/23 (from the past 48 hour(s))  Lipase, blood     Status: None   Collection Time: 01/11/23 11:53 AM  Result Value Ref Range   Lipase 39 11 - 51 U/L    Comment: Performed at [facility], [address] [place], [place] [address]  Comprehensive metabolic panel     Status: Abnormal   Collection Time: 01/11/23 11:53 AM  Result Value Ref Range   Sodium 140 135 - 145 mmol/L   Potassium 4.0 3.5 - 5.1 mmol/L   Chloride 104 98 - 111 mmol/L   CO2 21 (L) 22 - 32 mmol/L   Glucose, Bld 137 (H) 70 - 99 mg/dL    Comment: Glucose reference range applies only to samples taken after fasting for at least 8 hours.   BUN 59 (H) 8 - 23 mg/dL   Creatinine, Ser 6.04 (H) 0.61 - 1.24 mg/dL   Calcium 8.3 (L) 8.9 - 10.3 mg/dL   Total Protein 5.1 (L) 6.5 - 8.1 g/dL   Albumin 2.2 (L) 3.5 - 5.0 g/dL   AST 28 15 - 41 U/L   ALT 16 0 - 44 U/L   Alkaline Phosphatase 58 38 - 126 U/L   Total Bilirubin 0.5 0.3 - 1.2 mg/dL   GFR, Estimated 18 (L) >60 mL/min    Comment: (NOTE) Calculated using the CKD-EPI Creatinine Equation (2021)    Anion gap 15 5 - 15    Comment: Performed at [facility], [address], [place], [place] [address]  CBC     Status: Abnormal   Collection Time: 01/11/23 11:53 AM  Result Value Ref Range   WBC 8.2 4.0 - 10.5 K/uL   RBC 3.04 (L) 4.22 - 5.81 MIL/uL   Hemoglobin 9.1 (L) 13.0 - 17.0 g/dL   HCT 11.9 (L) 14.7 - 82.9 %   MCV 96.7 80.0 - 100.0 fL    MCH 29.9 26.0 - 34.0 pg   MCHC 31.0 30.0 - 36.0 g/dL   RDW 56.2 13.0 - 86.5 %   Platelets 201 150 - 400 K/uL   nRBC 0.0 0.0 - 0.2 %    Comment: Performed at [facility], [address], [place], [place] [address]  Urinalysis, Routine w reflex microscopic -Urine, Catheterized; Indwelling urinary catheter  Status: Abnormal   Collection Time: 01/11/23  2:23 PM  Result Value Ref Range   Color, Urine YELLOW YELLOW   APPearance CLOUDY (A) CLEAR   Specific Gravity, Urine 1.016 1.005 - 1.030   pH 5.0 5.0 - 8.0   Glucose, UA NEGATIVE NEGATIVE mg/dL   Hgb urine dipstick LARGE (A) NEGATIVE   Bilirubin Urine NEGATIVE NEGATIVE   Ketones, ur NEGATIVE NEGATIVE mg/dL   Protein, ur 161 (A) NEGATIVE mg/dL   Nitrite NEGATIVE NEGATIVE   Leukocytes,Ua MODERATE (A) NEGATIVE   RBC / HPF >50 0 - 5 RBC/hpf   WBC, UA 21-50 0 - 5 WBC/hpf   Bacteria, UA RARE (A) NONE SEEN   Squamous Epithelial / HPF 0-5 0 - 5 /HPF    Comment: Performed at [facility], [address], [place], [place] [address]   No results found.  Pending Labs Unresulted Labs (From admission, onward)    None       Vitals/Pain Today's Vitals   01/11/23 1530 01/11/23 1534 01/11/23 1545 01/11/23 1600  BP: (!) 98/42  100/69 (!) 91/59  Pulse:   72 66  Resp:   16 17  Temp:  (!) 97.3 F (36.3 C)    TempSrc:  Oral    SpO2:   98% 98%  PainSc:        Isolation Precautions No active isolations  Medications Medications  piperacillin-tazobactam (ZOSYN) IVPB 3.375 g (has no administration in time range)  ondansetron (ZOFRAN-ODT) disintegrating tablet 4 mg (4 mg Oral Given 01/11/23 1159)  sodium chloride 0.9 % bolus 1,000 mL (1,000 mLs Intravenous New Bag/Given 01/11/23 1627)    Mobility walks     Focused Assessments Pulmonary Assessment Handoff:  Lung sounds:   O2 Device: Room Air      R Recommendations: See Admitting Provider Note  Report given to:   Additional Notes:

## 2023-01-11 NOTE — ED Provider Notes (Signed)
[place] EMERGENCY DEPARTMENT AT [facility] Provider Note   CSN: [id number] Arrival date & time: 01/11/23  1135     History  Chief Complaint  Patient presents with   Abnormal Labs    Charles Golden is a 89 y.o. male with PMHx afib, CVA, COPD, GERD, glaucoma, IBS, CKD who presents to ED concerned for abnormal lab results (BUN/Cr), chills and diffuse weakness. Patient with foley catheter since 12/21/2022 for BPH.  Patient diagnosed with kidney stone 01/08/2023 at [facility] ED. Patient had nausea and dizziness yesterday so went to PCP who obtained labs and provided patient with CTX dose. Patient called today d/t BUN/Cr being elevated.  HPI     Home Medications Prior to Admission medications   Medication Sig Start Date End Date Taking? Authorizing Provider  acetaminophen (TYLENOL) 500 MG tablet Take 1,000 mg by mouth daily as needed for headache or moderate pain.    [provider]  apixaban (ELIQUIS) 2.5 MG TABS tablet Take 1 tablet (2.5 mg total) by mouth 2 (two) times daily. 01/24/22   [name], DO  aspirin EC 81 MG tablet Take 1 tablet (81 mg total) by mouth daily. Swallow whole. 08/03/21   Micki Riley, MD  atorvastatin (LIPITOR) 40 MG tablet Take 1 tablet (40 mg total) by mouth daily. 01/24/22   [name], DO  azelastine (ASTELIN) 0.1 % nasal spray PLACE 1 SPRAY IN EACH NOSTRIL ONCE A DAY AS DIRECTED Patient taking differently: Place 1 spray into both nostrils at bedtime. 11/27/22   [name], DO  cephALEXin (KEFLEX) 500 MG capsule Take 1 capsule (500 mg total) by mouth 4 (four) times daily. 01/08/23   Vanetta Mulders, MD  cholecalciferol (CHOLECALCIFEROL) 25 MCG tablet Take 1 tablet (1,000 Units total) by mouth every evening. 12/18/22   Azucena Fallen, MD  CRANBERRY PO Take 1 tablet by mouth every evening.    [provider]  Cyanocobalamin (VITAMIN B-12 PO) Take 1 tablet by mouth every evening.    [provider]  diclofenac Sodium (VOLTAREN) 1 % GEL Apply 2 g topically 4 (four) times daily as needed (arthritis). 08/08/22   [name], DO  docusate sodium (COLACE) 100 MG capsule Take 1 capsule (100 mg total) by mouth every evening. 12/18/22   Azucena Fallen, MD  finasteride (PROSCAR) 5 MG tablet Take 5 mg by mouth daily.    [provider]  fluticasone (FLONASE) 50 MCG/ACT nasal spray Place 2 sprays into both nostrils daily. Patient taking differently: Place 2 sprays into both nostrils at bedtime. 01/03/22   [name], DO  Fluticasone-Umeclidin-Vilant (TRELEGY ELLIPTA) 100-62.5-25 MCG/ACT AEPB Inhale 1 puff into the lungs every evening. Patient taking differently: Inhale 1 puff into the lungs daily. 09/17/22   Coralyn Helling, MD  gabapentin (NEURONTIN) 300 MG capsule Take 300 mg by mouth at bedtime.    [provider]  latanoprost (XALATAN) 0.005 % ophthalmic solution Place 1 drop into both eyes at bedtime. 12/18/22   Azucena Fallen, MD  lubiprostone (AMITIZA) 24 MCG capsule Take 1 capsule (24 mcg total) by mouth 2 (two) times daily with a meal. For constipation 12/11/22   Delynn Flavin M, DO  mirtazapine (REMERON) 7.5 MG tablet Take 1 tablet (7.5 mg total) by mouth at bedtime. 12/31/22   [name], DO  ondansetron (ZOFRAN-ODT) 4 MG disintegrating tablet Take 1 tablet (4 mg total) by mouth every 8 (eight) hours as needed for  nausea or vomiting. 12/20/22   Dettinger, Elige Radon, MD  oxyCODONE-acetaminophen (PERCOCET/ROXICET) 5-325 MG tablet Take 0.5-1 tablets by mouth every 8 (eight) hours as needed for severe pain (USE ONLY IF NEEDED). 11/20/22   [name], DO  silodosin (RAPAFLO) 8 MG CAPS capsule Take 8 mg by mouth daily. 06/02/21   [provider]  sodium zirconium cyclosilicate (LOKELMA) 10 g PACK packet Take 10 g by mouth 2 (two) times daily. 01/08/23   Vanetta Mulders, MD  timolol (TIMOPTIC) 0.5 % ophthalmic solution Place 1 drop into  the left eye every morning. 11/24/18   [provider]  VENTOLIN HFA 108 (90 Base) MCG/ACT inhaler INHALE 2 PUFFS EVERY 6 HOURS AS NEEDED FOR WHEEZING OR SHORTNESS OF BREATH 12/12/22   Delynn Flavin M, DO      Allergies    Demerol [meperidine] and Spiriva handihaler [tiotropium bromide monohydrate]    Review of Systems   Review of Systems  Gastrointestinal:        Elevated BUN/Cr    Physical Exam Updated Vital Signs BP (!) 91/59   Pulse 66   Temp (!) 97.3 F (36.3 C) (Oral)   Resp 17   SpO2 98%  Physical Exam Vitals and nursing note reviewed.  Constitutional:      General: He is not in acute distress.    Appearance: He is not ill-appearing or toxic-appearing.  HENT:     Head: Normocephalic and atraumatic.     Mouth/Throat:     Mouth: Mucous membranes are moist.  Eyes:     General: No scleral icterus.       Right eye: No discharge.        Left eye: No discharge.     Conjunctiva/sclera: Conjunctivae normal.  Cardiovascular:     Rate and Rhythm: Normal rate and regular rhythm.     Pulses: Normal pulses.     Heart sounds: Normal heart sounds. No murmur heard. Pulmonary:     Effort: Pulmonary effort is normal.  Abdominal:     General: Abdomen is flat. Bowel sounds are normal.     Palpations: Abdomen is soft. There is no mass.     Tenderness: There is no abdominal tenderness.  Musculoskeletal:     Right lower leg: No edema.     Left lower leg: No edema.  Skin:    General: Skin is warm and dry.     Findings: No rash.  Neurological:     General: No focal deficit present.     Mental Status: He is alert. Mental status is at baseline.     Comments: GCS 15. Speech is goal oriented. No deficits appreciated to CN III-XII. 4/5 strength against resistance in all major muscle groups bilaterally. Sensation to light touch intact. Patient moves extremities without ataxia.    Psychiatric:        Mood and Affect: Mood normal.        Behavior: Behavior normal.     ED  Results / Procedures / Treatments   Labs (all labs ordered are listed, but only abnormal results are displayed) Labs Reviewed  COMPREHENSIVE METABOLIC PANEL - Abnormal; Notable for the following components:      Result Value   CO2 21 (*)    Glucose, Bld 137 (*)    BUN 59 (*)    Creatinine, Ser 3.06 (*)    Calcium 8.3 (*)    Total Protein 5.1 (*)    Albumin 2.2 (*)    GFR, Estimated  18 (*)    All other components within normal limits  CBC - Abnormal; Notable for the following components:   RBC 3.04 (*)    Hemoglobin 9.1 (*)    HCT 29.4 (*)    All other components within normal limits  URINALYSIS, ROUTINE W REFLEX MICROSCOPIC - Abnormal; Notable for the following components:   APPearance CLOUDY (*)    Hgb urine dipstick LARGE (*)    Protein, ur 100 (*)    Leukocytes,Ua MODERATE (*)    Bacteria, UA RARE (*)    All other components within normal limits  LIPASE, BLOOD    EKG None  Radiology No results found.  Procedures Procedures    Medications Ordered in ED Medications  sodium chloride 0.9 % bolus 1,000 mL (has no administration in time range)  ondansetron (ZOFRAN-ODT) disintegrating tablet 4 mg (4 mg Oral Given 01/11/23 1159)    ED Course/ Medical Decision Making/ A&P                                 Medical Decision Making Amount and/or Complexity of Data Reviewed Labs: ordered.  Risk Prescription drug management.    This patient presents to the ED for concern of abdominal pain and AKI, this involves an extensive number of treatment options, and is a complaint that carries with it a high risk of complications and morbidity.  The differential diagnosis includes gastroenteritis, colitis, small bowel obstruction, appendicitis, cholecystitis, pancreatitis, nephrolithiasis, UTI, pyelonephritis   Co morbidities that complicate the patient evaluation  afib, CVA, COPD, GERD, glaucoma, IBS, CKD    Lab Tests:  I Ordered, and personally interpreted labs.  The  pertinent results include:   CBC with differential: anemia (9.1); no leukocytosis CMP: elevated BUN/Cr (59/3.06) Lipase: within normal limits UA: UA with moderate leukocytes, 21-50 WBC, and rare bacteria    Consultations Obtained:  I requested consultation with Hospitalist Dr. Katrinka Blazing,  and discussed lab and imaging findings as well as pertinent plan - they agree to admit patient   Problem List / ED Course / Critical interventions / Medication management  Admitting patient for AKI. Patient presents to ED sent by PCP for elevated BUN/Cr and weakness. Patient with foley catheter since 12/21/2022 for BPH and has been following with Urology. Patient also with kidney stone diagnosed 4 days ago. Patient seen by PCP yesterday with nausea and dizziness, was given dose of CTX, obtained labs, and discharged. BUN/Cr resulted which was elevated so PCP referred to ED. Physical exam unremarkable. BP was initially appropriate but is currently soft. No tachycardia.  CMP with BUN/Cr (59/3.06). UA with moderate leukocytes, 21-50 WBC, and rare bacteria. CBC without leukocytosis. Lipase within normal limits.  Starting patient on IV fluids and zosyn. Dr. Katrinka Blazing admitting provider. I have reviewed the patients home medicines and have made adjustments as needed  Ddx: These are considered less likely due to history of present illness and physical exam. -gastroenteritis: No vomiting in ED; no fever; tolerating PO intake -colitis: Denies diarrhea, patient afebrile  -appendicitis: Negative McBurney point, rebound tenderness, psoas, obturator sign  -cholecystitis: Negative Murphy sign; liver enzymes within normal limits -pancreatitis: No LUQ tenderness to palpation, lipase within normal limits     Social Determinants of Health:  none           Final Clinical Impression(s) / ED Diagnoses Final diagnoses:  AKI (acute kidney injury) (HCC)  Urinary tract infection associated with indwelling urethral  catheter, initial encounter [facility])    Rx / DC Orders ED Discharge Orders     None         Dorthy Cooler, [place] 01/11/23 1615    Anders Simmonds T, DO 01/12/23 2030

## 2023-01-11 NOTE — Progress Notes (Signed)
ED Antimicrobial Stewardship Positive Culture Follow Up   Charles Golden is an 89 y.o. male who presented to [facility] on 01/08/2023 with a chief complaint of  Chief Complaint  Patient presents with   Abdominal Pain    Recent Results (from the past 720 hour(s))  Urine Culture     Status: None   Collection Time: 12/17/22  6:54 PM   Specimen: Urine, Clean Catch  Result Value Ref Range Status   Specimen Description URINE, CLEAN CATCH  Final   Special Requests NONE  Final   Culture   Final    NO GROWTH Performed at [facility], [address] [place], [place] [address]    Report Status 12/18/2022 FINAL  Final  Urine Culture     Status: Abnormal   Collection Time: 01/08/23  3:09 PM   Specimen: Urine, Catheterized  Result Value Ref Range Status   Specimen Description   Final    URINE, CATHETERIZED Performed at [facility], [address] [place], [place] [address]    Special Requests   Final    NONE Performed at [facility], [address] [place], [place] [address]    Culture >=100,000 COLONIES/mL SERRATIA MARCESCENS (A)  Final   Report Status 01/10/2023 FINAL  Final   Organism ID, Bacteria SERRATIA MARCESCENS (A)  Final      Susceptibility   Serratia marcescens - MIC*    CEFEPIME <=0.12 SENSITIVE Sensitive     CEFTRIAXONE <=0.25 SENSITIVE Sensitive     CIPROFLOXACIN <=0.25 SENSITIVE Sensitive     GENTAMICIN <=1 SENSITIVE Sensitive     NITROFURANTOIN 256 RESISTANT Resistant     TRIMETH/SULFA <=20 SENSITIVE Sensitive     * >=100,000 COLONIES/mL SERRATIA MARCESCENS    [x]  Treated with cephalexin, organism resistant to prescribed antimicrobial.  New antibiotic prescription: ciprofloxacin 500 mg by mouth daily for 7 days.   Call patient to assess symptoms. Stop cephalexin and start ciprofloxacin course. Please counsel patient and family to have him seen if he has any altered mental status while one ciprofloxacin.  ED Provider: Evlyn Kanner, PA-C  Romie Minus, PharmD PGY1 Pharmacy Resident Monday - Friday phone -  [phone] Saturday - Sunday phone - [phone]

## 2023-01-11 NOTE — Telephone Encounter (Signed)
Critical creatinine of 3.01-per Tiffany pt needs to go to ED. Called pt and left message to call back regarding critical labs.

## 2023-01-11 NOTE — ED Notes (Signed)
Got UA from cath

## 2023-01-11 NOTE — Telephone Encounter (Signed)
I spoke to pt's daughter and she will take pt to ED now.

## 2023-01-11 NOTE — ED Triage Notes (Signed)
Pt came in via POV d/t being told he needed to come in for eval after his Creatinine was resulted as 3.01 & BUN of 51 (per family). Family at bedside reports he does have a kidney stone & he had a foley placed at [facility] & recently has been also having chills. A/Ox4, rates pain 4/10 in lower abd.

## 2023-01-12 DIAGNOSIS — N189 Chronic kidney disease, unspecified: Secondary | ICD-10-CM | POA: Diagnosis not present

## 2023-01-12 DIAGNOSIS — N179 Acute kidney failure, unspecified: Secondary | ICD-10-CM | POA: Diagnosis not present

## 2023-01-12 LAB — CBC
HCT: 26.7 % — ABNORMAL LOW (ref 39.0–52.0)
Hemoglobin: 8.4 g/dL — ABNORMAL LOW (ref 13.0–17.0)
MCH: 31 pg (ref 26.0–34.0)
MCHC: 31.5 g/dL (ref 30.0–36.0)
MCV: 98.5 fL (ref 80.0–100.0)
Platelets: 173 10*3/uL (ref 150–400)
RBC: 2.71 MIL/uL — ABNORMAL LOW (ref 4.22–5.81)
RDW: 14.6 % (ref 11.5–15.5)
WBC: 7.4 10*3/uL (ref 4.0–10.5)
nRBC: 0 % (ref 0.0–0.2)

## 2023-01-12 LAB — BASIC METABOLIC PANEL
Anion gap: 9 (ref 5–15)
BUN: 57 mg/dL — ABNORMAL HIGH (ref 8–23)
CO2: 22 mmol/L (ref 22–32)
Calcium: 7.7 mg/dL — ABNORMAL LOW (ref 8.9–10.3)
Chloride: 106 mmol/L (ref 98–111)
Creatinine, Ser: 2.69 mg/dL — ABNORMAL HIGH (ref 0.61–1.24)
GFR, Estimated: 21 mL/min — ABNORMAL LOW (ref >60)
Glucose, Bld: 112 mg/dL — ABNORMAL HIGH (ref 70–99)
Potassium: 3.5 mmol/L (ref 3.5–5.1)
Sodium: 137 mmol/L (ref 135–145)

## 2023-01-12 LAB — GLUCOSE, CAPILLARY
Glucose-Capillary: 75 mg/dL (ref 70–99)
Glucose-Capillary: 85 mg/dL (ref 70–99)
Glucose-Capillary: 131 mg/dL — ABNORMAL HIGH (ref 70–99)
Glucose-Capillary: 107 mg/dL — ABNORMAL HIGH (ref 70–99)

## 2023-01-12 MED ORDER — CHLORHEXIDINE GLUCONATE CLOTH 2 % EX PADS
6.0000 | MEDICATED_PAD | Freq: Every day | CUTANEOUS | Status: DC
  Administered 2023-01-12 – 2023-01-16 (×5): 6 via TOPICAL

## 2023-01-12 MED ORDER — CHLORHEXIDINE GLUCONATE CLOTH 2 % EX PADS: 6.0000 | MEDICATED_PAD | Freq: Every day | CUTANEOUS | Status: DC

## 2023-01-12 MED ORDER — DOCUSATE SODIUM 100 MG PO CAPS
100.0000 mg | ORAL_CAPSULE | Freq: Two times a day (BID) | ORAL | Status: DC
  Administered 2023-01-12 – 2023-01-16 (×9): 100 mg via ORAL
  Filled 2023-01-12 (×9): qty 1

## 2023-01-12 MED ORDER — CALCIUM POLYCARBOPHIL 625 MG PO TABS
625.0000 mg | ORAL_TABLET | Freq: Every day | ORAL | Status: DC
  Administered 2023-01-12 – 2023-01-16 (×5): 625 mg via ORAL
  Filled 2023-01-12 (×5): qty 1

## 2023-01-12 NOTE — Plan of Care (Signed)
  Problem: Clinical Measurements: Goal: Will remain free from infection Outcome: Progressing   Problem: Activity: Goal: Risk for activity intolerance will decrease Outcome: Progressing   Problem: Nutrition: Goal: Adequate nutrition will be maintained Outcome: Progressing   Problem: Elimination: Goal: Will not experience complications related to bowel motility Outcome: Progressing Goal: Will not experience complications related to urinary retention Outcome: Progressing   Problem: Safety: Goal: Ability to remain free from injury will improve Outcome: Progressing

## 2023-01-12 NOTE — Evaluation (Signed)
Physical Therapy Evaluation Patient Details Name: KACYN BASTIEN MRN: [id number] DOB: 18-Aug-1925 Today's Date: 01/12/2023  History of Present Illness  Pt is a 89 year old male admitted on 01/11/23 for abnormal labs. Past medical history significant of atrial fibrillation on Eliquis, CVA, COPD, CKD stage 3b, nephrolithiasis, BPH, and GERD.  Clinical Impression  Pt presents with admitting diagnosis above. Pt today was able to ambulate short distance in hallway with RW. Pt presents at or near baseline mobility. Pt has no further acute PT needs and will be signing off. Re consult PT if mobility status changes.       If plan is discharge home, recommend the following: A little help with walking and/or transfers;A little help with bathing/dressing/bathroom;Assistance with cooking/housework;Direct supervision/assist for medications management;Assist for transportation;Help with stairs or ramp for entrance   Can travel by private vehicle        Equipment Recommendations None recommended by PT  Recommendations for Other Services       Functional Status Assessment Patient has had a recent decline in their functional status and demonstrates the ability to make significant improvements in function in a reasonable and predictable amount of time.     Precautions / Restrictions Precautions Precautions: Fall Restrictions Weight Bearing Restrictions: No      Mobility  Bed Mobility Overal bed mobility: Modified Independent             General bed mobility comments: HOB elevated    Transfers Overall transfer level: Needs assistance Equipment used: Rolling walker (2 wheels) Transfers: Sit to/from Stand Sit to Stand: Supervision           General transfer comment: Good hand placement    Ambulation/Gait Ambulation/Gait assistance: Supervision Gait Distance (Feet): 80 Feet Assistive device: Rolling walker (2 wheels) Gait Pattern/deviations: Trunk flexed, Decreased stride length,  Step-through pattern Gait velocity: decreased     General Gait Details: Cues for proximity to RW and upright posture. no LOB noted  Stairs            Wheelchair Mobility     Tilt Bed    Modified Rankin (Stroke Patients Only)       Balance Overall balance assessment: Mild deficits observed, not formally tested                                           Pertinent Vitals/Pain Pain Assessment Pain Assessment: No/denies pain    Home Living Family/patient expects to be discharged to:: Private residence Living Arrangements: Alone Available Help at Discharge: Family;Available 24 hours/day Type of Home: House Home Access: Stairs to enter Entrance Stairs-Rails: Can reach both Entrance Stairs-Number of Steps: 2   Home Layout: One level Home Equipment: Agricultural consultant (2 wheels);Shower seat;Grab bars - toilet;Grab bars - tub/shower Additional Comments: lives alone, three daughter assists as needed, one lives across street    Prior Function Prior Level of Function : Independent/Modified Independent             Mobility Comments: Pt reports that he uses the RW some but not all the time ADLs Comments: some assistance for meals/cleaning     Extremity/Trunk Assessment   Upper Extremity Assessment Upper Extremity Assessment: Overall WFL for tasks assessed    Lower Extremity Assessment Lower Extremity Assessment: Overall WFL for tasks assessed    Cervical / Trunk Assessment Cervical / Trunk Assessment: Kyphotic  Communication  Communication Communication: Hearing impairment  Cognition Arousal: Alert Behavior During Therapy: WFL for tasks assessed/performed Overall Cognitive Status: Within Functional Limits for tasks assessed                                          General Comments General comments (skin integrity, edema, etc.): VSS    Exercises     Assessment/Plan    PT Assessment Patient does not need any further PT  services  PT Problem List         PT Treatment Interventions      PT Goals (Current goals can be found in the Care Plan section)  Acute Rehab PT Goals PT Goal Formulation: All assessment and education complete, DC therapy    Frequency       Co-evaluation               AM-PAC PT "6 Clicks" Mobility  Outcome Measure Help needed turning from your back to your side while in a flat bed without using bedrails?: None Help needed moving from lying on your back to sitting on the side of a flat bed without using bedrails?: None Help needed moving to and from a bed to a chair (including a wheelchair)?: A Little Help needed standing up from a chair using your arms (e.g., wheelchair or bedside chair)?: A Little Help needed to walk in hospital room?: A Little Help needed climbing 3-5 steps with a railing? : A Lot 6 Click Score: 19    End of Session Equipment Utilized During Treatment: Gait belt Activity Tolerance: Patient tolerated treatment well Patient left: in bed;with call bell/phone within reach Nurse Communication: Mobility status PT Visit Diagnosis: Other abnormalities of gait and mobility (R26.89)    Time: 6962-9528 PT Time Calculation (min) (ACUTE ONLY): 11 min   Charges:   PT Evaluation $PT Eval Low Complexity: 1 Low   PT General Charges $$ ACUTE PT VISIT: 1 Visit         Shela Nevin, PT, DPT Acute Rehab Services [phone]   Gladys Damme 01/12/2023, 3:46 PM

## 2023-01-12 NOTE — Progress Notes (Signed)
Chronic foley removed on 8/17 at 0800.

## 2023-01-12 NOTE — Progress Notes (Signed)
PROGRESS NOTE    Charles Golden  MWN:[id number] DOB: 1926-02-22 DOA: 01/11/2023 PCP: [name], DO    Brief Narrative:  Charles Golden is a 89 y.o. male with medical history significant of atrial fibrillation on Eliquis, CVA, COPD, CKD stage 3b, nephrolithiasis, BPH, and GERD who presented for abnormal labs.  At baseline patient lives at home and ambulates with walker.  He reports that over the last 2 days he has not eaten much of anything due to complaints of nausea and decreased appetite.  He reports having nausea for the last week.  His daughter notes that he had fever up to 32 F yesterday at home and patient reports feeling globally weak.  He suffers from chronic constipation    Assessment and Plan: Acute kidney injury superimposed on chronic kidney disease stage IIIb Creatinine elevated up to 3.06 with BUN 59.  Patient had not been eating or drinking much for the last couple days with reports of decreased appetite and nausea.  Creatinine had been 1.7 on 8/13.   -IVF and daily labs  Complicated urinary tract infection Acute.  Patient has had a chronic indwelling Foley since July.  Urine culture from 3 days ago grew out Serratia marcesscens.  Patient had been given cephalexin and received a dose of Rocephin IV yesterday.  Urinalysis still noted moderate leukocytes, rare bacteria, and 21-50 WBCs.  Initially given Zosyn. -Orders placed to change Foley catheter 8/17 -Changed antibiotics to Rocephin IV -has appointment with urology on 8/22   Weakness/ Debility At baseline patient gets around with the use of a walker, but over the last couple of days have been feeling more weak. -Physical therapy to evaluate   Normocytic anemia Hemoglobin 9.1 which appears to be possibly 2 g lower than when checked on 9/13.  Patient denies any reports of bleeding.  -trend   Heart failure with preserved EF Patient does not appear grossly fluid overloaded at this time.  Last available  echocardiogram noted EF to be 60 to 65% with grade 2 diastolic dysfunction back in 2022.    -Monitor intake and output -Daily weights   Paroxysmal atrial fibrillation on chronic anticoagulation -Continue Eliquis   History of CVA Carotid artery stenosis status post ICA stent Patient with prior history of CVAs back in 04/2021 for which she underwent left carotid angioplasty and stenting by interventional radiology. -Continue aspirin and Eliquis   COPD, without acute exacerbation -Continue pharmacy substitution of Trelegy -Albuterol nebs as needed for shortness of breath/wheezing   Hyperlipidemia -Continue atorvastatin   BPH with urinary obstruction -Foley catheter -Continue finasteride and pharmacy substitution of tamsulosin  Constipation -bowel regimen    DVT prophylaxis: apixaban (ELIQUIS) tablet 2.5 mg Start: 01/11/23 2200 apixaban (ELIQUIS) tablet 2.5 mg    Code Status: Full Code Family Communication: daughter at bedside  Disposition Plan:  Level of care: Telemetry Medical Status is: Inpatient Remains inpatient appropriate     Consultants:  none   Subjective: Main issue per patient is constipation  Objective: Vitals:   01/11/23 1901 01/12/23 0515 01/12/23 0832 01/12/23 0904  BP: 118/69 114/64  (!) 130/56  Pulse: 64 71  68  Resp:  16  16  Temp: 98 F (36.7 C) 98.6 F (37 C)  97.6 F (36.4 C)  TempSrc:    Oral  SpO2: 100% 95% 95% 99%    Intake/Output Summary (Last 24 hours) at 01/12/2023 1101 Last data filed at 01/12/2023 0900 Gross per 24 hour  Intake 499.69 ml  Output --  Net 499.69 ml   There were no vitals filed for this visit.  Examination:   General: Appearance:    Well developed, well nourished male in no acute distress- hard of hearing     Lungs:     Clear to auscultation bilaterally, respirations unlabored  Heart:    Normal heart rate.     MS:   All extremities are intact.    Neurologic:   Awake, alert       Data Reviewed: I  have personally reviewed following labs and imaging studies  CBC: Recent Labs  Lab 01/08/23 1412 01/10/23 1142 01/11/23 1153 01/12/23 0053  WBC 11.1* 11.6* 8.2 7.4  NEUTROABS  --  10.2*  --   --   HGB 11.1* 10.1* 9.1* 8.4*  HCT 36.3* 30.9* 29.4* 26.7*  MCV 99.7 94 96.7 98.5  PLT 269 204 201 173   Basic Metabolic Panel: Recent Labs  Lab 01/08/23 1412 01/10/23 1142 01/11/23 1153 01/12/23 0053  NA 137 139 140 137  K 5.2* 4.2 4.0 3.5  CL 105 102 104 106  CO2 23 21 21* 22  GLUCOSE 107* 108* 137* 112*  BUN 28* 51* 59* 57*  CREATININE 1.72* 3.01* 3.06* 2.69*  CALCIUM 8.7* 8.1* 8.3* 7.7*  MG 1.7  --   --   --    GFR: Estimated Creatinine Clearance: 15.6 mL/min (A) (by C-G formula based on SCr of 2.69 mg/dL (H)). Liver Function Tests: Recent Labs  Lab 01/08/23 1412 01/10/23 1142 01/11/23 1153  AST 18 20 28   ALT 14 11 16   ALKPHOS 81 72 58  BILITOT 0.6 0.4 0.5  PROT 6.2* 5.3* 5.1*  ALBUMIN 3.0* 2.8* 2.2*   Recent Labs  Lab 01/08/23 1412 01/11/23 1153  LIPASE 40 39   No results for input(s): "AMMONIA" in the last 168 hours. Coagulation Profile: No results for input(s): "INR", "PROTIME" in the last 168 hours. Cardiac Enzymes: No results for input(s): "CKTOTAL", "CKMB", "CKMBINDEX", "TROPONINI" in the last 168 hours. BNP (last 3 results) Recent Labs    12/14/22 1011  PROBNP 1,661*   HbA1C: No results for input(s): "HGBA1C" in the last 72 hours. CBG: Recent Labs  Lab 01/12/23 0906 01/12/23 0941  GLUCAP 75 85   Lipid Profile: No results for input(s): "CHOL", "HDL", "LDLCALC", "TRIG", "CHOLHDL", "LDLDIRECT" in the last 72 hours. Thyroid Function Tests: No results for input(s): "TSH", "T4TOTAL", "FREET4", "T3FREE", "THYROIDAB" in the last 72 hours. Anemia Panel: No results for input(s): "VITAMINB12", "FOLATE", "FERRITIN", "TIBC", "IRON", "RETICCTPCT" in the last 72 hours. Sepsis Labs: No results for input(s): "PROCALCITON", "LATICACIDVEN" in the last 168  hours.  Recent Results (from the past 240 hour(s))  Urine Culture     Status: Abnormal   Collection Time: 01/08/23  3:09 PM   Specimen: Urine, Catheterized  Result Value Ref Range Status   Specimen Description   Final    URINE, CATHETERIZED Performed at [facility], [address] [place], [place] [address]    Special Requests   Final    NONE Performed at [facility], [address] [place], [place] [address]    Culture >=100,000 COLONIES/mL SERRATIA MARCESCENS (A)  Final   Report Status 01/10/2023 FINAL  Final   Organism ID, Bacteria SERRATIA MARCESCENS (A)  Final      Susceptibility   Serratia marcescens - MIC*    CEFEPIME <=0.12 SENSITIVE Sensitive     CEFTRIAXONE <=0.25 SENSITIVE Sensitive     CIPROFLOXACIN <=0.25 SENSITIVE Sensitive  GENTAMICIN <=1 SENSITIVE Sensitive     NITROFURANTOIN 256 RESISTANT Resistant     TRIMETH/SULFA <=20 SENSITIVE Sensitive     * >=100,000 COLONIES/mL SERRATIA MARCESCENS         Radiology Studies: No results found.      Scheduled Meds:  apixaban  2.5 mg Oral BID   aspirin EC  81 mg Oral Daily   atorvastatin  40 mg Oral Daily   finasteride  5 mg Oral Daily   fluticasone furoate-vilanterol  1 puff Inhalation Daily   And   umeclidinium bromide  1 puff Inhalation Daily   gabapentin  300 mg Oral QHS   mirtazapine  7.5 mg Oral QHS   sodium chloride flush  3 mL Intravenous Q12H   tamsulosin  0.4 mg Oral QPC supper   Continuous Infusions:  sodium chloride 50 mL/hr at 01/12/23 0228   cefTRIAXone (ROCEPHIN)  IV 1 g (01/12/23 0637)     LOS: 1 day    Time spent: 45 minutes spent on chart review, discussion with nursing staff, consultants, updating family and interview/physical exam; more than 50% of that time was spent in counseling and/or coordination of care.    Joseph Art, DO [facility] Available via Epic secure chat 7am-7pm After these hours, please refer to coverage provider listed on [email or web address] 01/12/2023,  11:01 AM

## 2023-01-12 NOTE — Plan of Care (Signed)

## 2023-01-12 NOTE — Plan of Care (Signed)

## 2023-01-13 DIAGNOSIS — N189 Chronic kidney disease, unspecified: Secondary | ICD-10-CM | POA: Diagnosis not present

## 2023-01-13 DIAGNOSIS — N179 Acute kidney failure, unspecified: Secondary | ICD-10-CM | POA: Diagnosis not present

## 2023-01-13 LAB — BASIC METABOLIC PANEL
Anion gap: 10 (ref 5–15)
BUN: 39 mg/dL — ABNORMAL HIGH (ref 8–23)
CO2: 22 mmol/L (ref 22–32)
Calcium: 8.2 mg/dL — ABNORMAL LOW (ref 8.9–10.3)
Chloride: 107 mmol/L (ref 98–111)
Creatinine, Ser: 2.36 mg/dL — ABNORMAL HIGH (ref 0.61–1.24)
GFR, Estimated: 25 mL/min — ABNORMAL LOW (ref >60)
Glucose, Bld: 100 mg/dL — ABNORMAL HIGH (ref 70–99)
Potassium: 4 mmol/L (ref 3.5–5.1)
Sodium: 139 mmol/L (ref 135–145)

## 2023-01-13 LAB — CBC
HCT: 30.2 % — ABNORMAL LOW (ref 39.0–52.0)
Hemoglobin: 9.3 g/dL — ABNORMAL LOW (ref 13.0–17.0)
MCH: 30.3 pg (ref 26.0–34.0)
MCHC: 30.8 g/dL (ref 30.0–36.0)
MCV: 98.4 fL (ref 80.0–100.0)
Platelets: 175 10*3/uL (ref 150–400)
RBC: 3.07 MIL/uL — ABNORMAL LOW (ref 4.22–5.81)
RDW: 14.3 % (ref 11.5–15.5)
WBC: 6 10*3/uL (ref 4.0–10.5)
nRBC: 0 % (ref 0.0–0.2)

## 2023-01-13 MED ORDER — BIOTENE DRY MOUTH MT LIQD: 15.0000 mL | OROMUCOSAL | Status: DC | PRN

## 2023-01-13 MED ORDER — HALOPERIDOL LACTATE 5 MG/ML IJ SOLN: 1.0000 mg | Freq: Once | INTRAMUSCULAR | Status: DC

## 2023-01-13 MED ORDER — SENNA 8.6 MG PO TABS
1.0000 | ORAL_TABLET | Freq: Every day | ORAL | Status: DC
  Administered 2023-01-13 – 2023-01-15 (×3): 8.6 mg via ORAL
  Filled 2023-01-13 (×3): qty 1

## 2023-01-13 NOTE — Progress Notes (Signed)
PROGRESS NOTE    KHYRON HOLLENBERG  ZOX:[id number] DOB: [birth year] DOA: 01/11/2023 PCP: [name], DO    Brief Narrative:  Charles Golden is a 89 y.o. male with medical history significant of atrial fibrillation on Eliquis, CVA, COPD, CKD stage 3b, nephrolithiasis, BPH, and GERD who presented for abnormal labs.  At baseline patient lives at home and ambulates with walker.  He reports that over the last 2 days he has not eaten much of anything due to complaints of nausea and decreased appetite.  He reports having nausea for the last week.  His daughter notes that he had fever up to 10 F yesterday at home and patient reports feeling globally weak.  He suffers from chronic constipation    Assessment and Plan: Acute kidney injury superimposed on chronic kidney disease stage IIIb Creatinine elevated up to 3.06 with BUN 59.  Patient had not been eating or drinking much for the last couple days with reports of decreased appetite and nausea.  Creatinine had been 1.7 on 8/13.   -IVF and daily labs  Complicated urinary tract infection Acute.  Patient has had a chronic indwelling Foley since July.  Urine culture from 3 days ago grew out Serratia marcesscens.  Patient had been given cephalexin and received a dose of Rocephin IV yesterday.  Urinalysis still noted moderate leukocytes, rare bacteria, and 21-50 WBCs.  Initially given Zosyn. -Orders placed to change Foley catheter 8/17 -Changed antibiotics to Rocephin IV -has appointment with urology on 8/22   Weakness/ Debility -did well with PT-- no follow up   Normocytic anemia Hemoglobin 9.1 which appears to be possibly 2 g lower than when checked on 9/13.   -dark stools -trend CBC -check stools for blood   Heart failure with preserved EF Patient does not appear grossly fluid overloaded at this time.  Last available echocardiogram noted EF to be 60 to 65% with grade 2 diastolic dysfunction back in 2022.    -Monitor intake and  output -Daily weights   Paroxysmal atrial fibrillation on chronic anticoagulation -Continue Eliquis   History of CVA Carotid artery stenosis status post ICA stent Patient with prior history of CVAs back in 04/2021 for which she underwent left carotid angioplasty and stenting by interventional radiology. -Continue Eliquis   COPD, without acute exacerbation -Continue pharmacy substitution of Trelegy -Albuterol nebs as needed for shortness of breath/wheezing   Hyperlipidemia -Continue atorvastatin   BPH with urinary obstruction -Foley catheter -Continue finasteride and pharmacy substitution of tamsulosin  Constipation -bowel regimen  Delirium precuations  DVT prophylaxis: apixaban (ELIQUIS) tablet 2.5 mg Start: 01/11/23 2200 apixaban (ELIQUIS) tablet 2.5 mg    Code Status: Full Code Family Communication: daughter at bedside  Disposition Plan:  Level of care: Med-Surg Status is: Inpatient Remains inpatient appropriate     Consultants:  none   Subjective: Continues to c/o constipation and dark looking stools when he has them Family says he had some confusion yesterday and this AM  Objective: Vitals:   01/12/23 2003 01/13/23 0526 01/13/23 0757 01/13/23 0848  BP: 108/65 135/69 127/73   Pulse: 82 69 70   Resp: 16 16    Temp: 99.7 F (37.6 C)  98 F (36.7 C)   TempSrc: Oral  Oral   SpO2: 97% 100% 100% 100%    Intake/Output Summary (Last 24 hours) at 01/13/2023 1107 Last data filed at 01/13/2023 1000 Gross per 24 hour  Intake 1572.4 ml  Output 2250 ml  Net -677.6 ml  There were no vitals filed for this visit.  Examination:   General: Appearance:    Well developed, well nourished male in no acute distress- mildly hard of hearing     Lungs:     respirations unlabored  Heart:    Normal heart rate.     MS:   All extremities are intact.    Neurologic:   Awake, alert       Data Reviewed: I have personally reviewed following labs and imaging  studies  CBC: Recent Labs  Lab 01/08/23 1412 01/10/23 1142 01/11/23 1153 01/12/23 0053 01/13/23 0430  WBC 11.1* 11.6* 8.2 7.4 6.0  NEUTROABS  --  10.2*  --   --   --   HGB 11.1* 10.1* 9.1* 8.4* 9.3*  HCT 36.3* 30.9* 29.4* 26.7* 30.2*  MCV 99.7 94 96.7 98.5 98.4  PLT 269 204 201 173 175   Basic Metabolic Panel: Recent Labs  Lab 01/08/23 1412 01/10/23 1142 01/11/23 1153 01/12/23 0053 01/13/23 0430  NA 137 139 140 137 139  K 5.2* 4.2 4.0 3.5 4.0  CL 105 102 104 106 107  CO2 23 21 21* 22 22  GLUCOSE 107* 108* 137* 112* 100*  BUN 28* 51* 59* 57* 39*  CREATININE 1.72* 3.01* 3.06* 2.69* 2.36*  CALCIUM 8.7* 8.1* 8.3* 7.7* 8.2*  MG 1.7  --   --   --   --    GFR: Estimated Creatinine Clearance: 17.8 mL/min (A) (by C-G formula based on SCr of 2.36 mg/dL (H)). Liver Function Tests: Recent Labs  Lab 01/08/23 1412 01/10/23 1142 01/11/23 1153  AST 18 20 28   ALT 14 11 16   ALKPHOS 81 72 58  BILITOT 0.6 0.4 0.5  PROT 6.2* 5.3* 5.1*  ALBUMIN 3.0* 2.8* 2.2*   Recent Labs  Lab 01/08/23 1412 01/11/23 1153  LIPASE 40 39   No results for input(s): "AMMONIA" in the last 168 hours. Coagulation Profile: No results for input(s): "INR", "PROTIME" in the last 168 hours. Cardiac Enzymes: No results for input(s): "CKTOTAL", "CKMB", "CKMBINDEX", "TROPONINI" in the last 168 hours. BNP (last 3 results) Recent Labs    12/14/22 1011  PROBNP 1,661*   HbA1C: No results for input(s): "HGBA1C" in the last 72 hours. CBG: Recent Labs  Lab 01/12/23 0906 01/12/23 0941 01/12/23 1146 01/12/23 1725  GLUCAP 75 85 131* 107*   Lipid Profile: No results for input(s): "CHOL", "HDL", "LDLCALC", "TRIG", "CHOLHDL", "LDLDIRECT" in the last 72 hours. Thyroid Function Tests: No results for input(s): "TSH", "T4TOTAL", "FREET4", "T3FREE", "THYROIDAB" in the last 72 hours. Anemia Panel: No results for input(s): "VITAMINB12", "FOLATE", "FERRITIN", "TIBC", "IRON", "RETICCTPCT" in the last 72  hours. Sepsis Labs: No results for input(s): "PROCALCITON", "LATICACIDVEN" in the last 168 hours.  Recent Results (from the past 240 hour(s))  Urine Culture     Status: Abnormal   Collection Time: 01/08/23  3:09 PM   Specimen: Urine, Catheterized  Result Value Ref Range Status   Specimen Description   Final    URINE, CATHETERIZED Performed at [facility], [address], [place], [place] [address]    Special Requests   Final    NONE Performed at [facility], [address], [place], [place] [address]    Culture >=100,000 COLONIES/mL SERRATIA MARCESCENS (A)  Final   Report Status 01/10/2023 FINAL  Final   Organism ID, Bacteria SERRATIA MARCESCENS (A)  Final      Susceptibility   Serratia marcescens - MIC*    CEFEPIME <=0.12 SENSITIVE  Sensitive     CEFTRIAXONE <=0.25 SENSITIVE Sensitive     CIPROFLOXACIN <=0.25 SENSITIVE Sensitive     GENTAMICIN <=1 SENSITIVE Sensitive     NITROFURANTOIN 256 RESISTANT Resistant     TRIMETH/SULFA <=20 SENSITIVE Sensitive     * >=100,000 COLONIES/mL SERRATIA MARCESCENS         Radiology Studies: No results found.      Scheduled Meds:  apixaban  2.5 mg Oral BID   atorvastatin  40 mg Oral Daily   Chlorhexidine Gluconate Cloth  6 each Topical Daily   docusate sodium  100 mg Oral BID   finasteride  5 mg Oral Daily   fluticasone furoate-vilanterol  1 puff Inhalation Daily   And   umeclidinium bromide  1 puff Inhalation Daily   gabapentin  300 mg Oral QHS   mirtazapine  7.5 mg Oral QHS   polycarbophil  625 mg Oral Daily   senna  1 tablet Oral Daily   sodium chloride flush  3 mL Intravenous Q12H   tamsulosin  0.4 mg Oral QPC supper   Continuous Infusions:  sodium chloride 50 mL/hr at 01/12/23 1649   cefTRIAXone (ROCEPHIN)  IV 1 g (01/13/23 0633)     LOS: 2 days    Time spent: 45 minutes spent on chart review, discussion with nursing staff, consultants, updating family and interview/physical exam; more than 50% of that time was  spent in counseling and/or coordination of care.    Joseph Art, DO [facility] Available via Epic secure chat 7am-7pm After these hours, please refer to coverage provider listed on [email or web address] 01/13/2023, 11:07 AM

## 2023-01-13 NOTE — Plan of Care (Signed)

## 2023-01-14 ENCOUNTER — Ambulatory Visit: Payer: Medicare Other | Admitting: Family Medicine

## 2023-01-14 DIAGNOSIS — N189 Chronic kidney disease, unspecified: Secondary | ICD-10-CM | POA: Diagnosis not present

## 2023-01-14 DIAGNOSIS — N179 Acute kidney failure, unspecified: Secondary | ICD-10-CM | POA: Diagnosis not present

## 2023-01-14 LAB — CBC
HCT: 27 % — ABNORMAL LOW (ref 39.0–52.0)
Hemoglobin: 8.5 g/dL — ABNORMAL LOW (ref 13.0–17.0)
MCH: 30.1 pg (ref 26.0–34.0)
MCHC: 31.5 g/dL (ref 30.0–36.0)
MCV: 95.7 fL (ref 80.0–100.0)
Platelets: 191 10*3/uL (ref 150–400)
RBC: 2.82 MIL/uL — ABNORMAL LOW (ref 4.22–5.81)
RDW: 14.1 % (ref 11.5–15.5)
WBC: 5.6 10*3/uL (ref 4.0–10.5)
nRBC: 0 % (ref 0.0–0.2)

## 2023-01-14 LAB — BASIC METABOLIC PANEL
Anion gap: 6 (ref 5–15)
BUN: 28 mg/dL — ABNORMAL HIGH (ref 8–23)
CO2: 23 mmol/L (ref 22–32)
Calcium: 7.9 mg/dL — ABNORMAL LOW (ref 8.9–10.3)
Chloride: 108 mmol/L (ref 98–111)
Creatinine, Ser: 1.87 mg/dL — ABNORMAL HIGH (ref 0.61–1.24)
GFR, Estimated: 33 mL/min — ABNORMAL LOW (ref >60)
Glucose, Bld: 101 mg/dL — ABNORMAL HIGH (ref 70–99)
Potassium: 4 mmol/L (ref 3.5–5.1)
Sodium: 137 mmol/L (ref 135–145)

## 2023-01-14 MED ORDER — ACETAMINOPHEN 325 MG PO TABS
650.0000 mg | ORAL_TABLET | Freq: Four times a day (QID) | ORAL | Status: DC | PRN
  Administered 2023-01-14 – 2023-01-15 (×2): 650 mg via ORAL
  Filled 2023-01-14 (×2): qty 2

## 2023-01-14 NOTE — Progress Notes (Signed)
This patient has had a headache all day.  He has requested tylenol for headache.  This nurse asked Idelle Leech, MD for tylenol multiple times.  I asked the MD to please place order.  MD responded with "Ok, please place order" and never placed order.  After multiple attempts, this nurse placed order for tylenol 650mg  Q6hr PRN.

## 2023-01-14 NOTE — TOC CM/SW Note (Signed)
Transition of Care [facility]) - Inpatient Brief Assessment   Patient Details  Name: Charles Golden MRN: [id number] Date of Birth: 07/22/1925  [facility]) CM/SW Contact:    Epifanio Lesches, RN Phone Number: 01/14/2023, 2:29 PM   Clinical Narrative: Readmitted for abnormal labs, AKI, UTI. From home alone. Supportive daughters (3). Active with [employer]. Local pharmacy:[facility].. Active with [facility],PT/OT.  TOC team following for needs....  Transition of Care Asessment: Insurance and Status: Insurance coverage has been reviewed Patient has primary care physician: Yes (PCP is [facility]) Home environment has been reviewed: From home alone. Daughter lives across the street. Prior level of function:: Independent with ADL's. Has a walker and shower seat at home. Prior/Current Home Services: No current home services Social Determinants of Health Reivew: SDOH reviewed no interventions necessary Readmission risk has been reviewed: Yes Transition of care needs: no transition of care needs at this time

## 2023-01-14 NOTE — Plan of Care (Signed)

## 2023-01-14 NOTE — Progress Notes (Signed)
PROGRESS NOTE    Charles Golden  NUU:[id number] DOB: 12-06-1925 DOA: 01/11/2023 PCP: [name], DO   Brief Narrative:  This 89 y.o. male with medical history significant of atrial fibrillation on Eliquis, CVA, COPD, CKD stage 3b, Nephrolithiasis, BPH, and GERD who presented for abnormal labs.  At baseline patient lives at home and ambulates with walker.  He reports that over the last 2 days he has not eaten much of anything due to complaints of nausea and decreased appetite.  He reports having nausea for the last week.  His daughter notes that he had fever up to 64 F yesterday at home and patient reports feeling globally weak.  He suffers from chronic constipation   Assessment & Plan:   Principal Problem:   Acute kidney injury superimposed on chronic kidney disease (HCC) Active Problems:   Complicated urinary tract infection   Debility   Heart failure with preserved ejection fraction (HCC)   Paroxysmal atrial fibrillation (HCC)   History of CVA (cerebrovascular accident)   COPD GOLD I    Hyperlipidemia   BPH (benign prostatic hyperplasia)   Normocytic anemia  AKI on CKD stage IIIb: Creatinine elevated up to 3.06 with BUN 59.   Patient had not been eating or drinking much for the last couple days with reports of decreased appetite and nausea.  Continue IV fluid resuscitation, avoid nephrotoxic medications. Serum creatinine is improving. 3.06 > 2.82 > 2.36 > 1.87   Complicated urinary tract infection: Acute. Patient has had chronic indwelling Foley since July.  Urine culture from 3 days ago grew out Serratia marcesscens.Patient had been given cephalexin and received a dose of Rocephin IV yesterday.   Urinalysis still noted moderate leukocytes, rare bacteria, and 21-50 WBCs.  Initially given Zosyn in the ED. Foley exchanged in the ED Antibiotics changed to IV Rocephin. Patient has appointment with urology on 01/17/23.   Generalized Weakness / Debility: Patient participated  well with PT, no follow-up.   Normocytic anemia: Hemoglobin 9.1 which appears to be possibly 2 g lower than when checked on 9/13.   Patient also reports dark stools Trend H/H /CBC. Check stools for occult blood.   Heart failure with preserved EF: Patient does not appear grossly fluid overloaded at this time.   Last available echocardiogram noted EF to be 60 to 65% with grade 2 diastolic dysfunction back in 2022.    Monitor intake and output Daily weights   Paroxysmal atrial fibrillation on chronic anticoagulation: Continue Eliquis. HR well controlled.   History of CVA: Carotid artery stenosis status post ICA stent Patient with prior history of CVAs back in 04/2021 for which she underwent left carotid angioplasty and stenting by interventional radiology. Continue Eliquis.   COPD, without acute exacerbation: Continue pharmacy substitution of Trelegy Continue Albuterol nebs as needed for shortness of breath/wheezing   Hyperlipidemia: Continue atorvastatin.   BPH with urinary obstruction Continue Foley catheter Continue finasteride and pharmacy substitution of tamsulosin.   Constipation Continue bowel regimen   Delirium precuations    DVT prophylaxis: Eliquis Code Status:Full code. Family Communication: daughter at bed side. Disposition Plan:     Status is: Inpatient Remains inpatient appropriate because: Admitted for complicated UTI and AKI.   Consultants:  None  Procedures: None  Antimicrobials:  Anti-infectives (From admission, onward)    Start     Dose/Rate Route Frequency Ordered Stop   01/12/23 0600  cefTRIAXone (ROCEPHIN) 1 g in sodium chloride 0.9 % 100 mL IVPB  1 g 200 mL/hr over 30 Minutes Intravenous Every 24 hours 01/11/23 1729     01/11/23 1630  piperacillin-tazobactam (ZOSYN) IVPB 3.375 g        3.375 g 100 mL/hr over 30 Minutes Intravenous  Once 01/11/23 1628 01/11/23 1715       Subjective: Patient was seen and examined at bedside.   Overnight events noted. Patient states he has not had a bowel movement in 7 days.  He denies any pain, burning urination.  Objective: Vitals:   01/13/23 2032 01/14/23 0802 01/14/23 0811 01/14/23 1433  BP: (!) 140/82 (!) 171/62  (!) 99/52  Pulse: 80 83 68 79  Resp: 16  18   Temp: 98.8 F (37.1 C) 97.8 F (36.6 C)  97.7 F (36.5 C)  TempSrc: Oral Oral  Oral  SpO2: 97% 100% 98% 96%    Intake/Output Summary (Last 24 hours) at 01/14/2023 1527 Last data filed at 01/14/2023 0603 Gross per 24 hour  Intake --  Output 1850 ml  Net -1850 ml   There were no vitals filed for this visit.  Examination:  General exam: Appears calm and comfortable, not in any acute distress.  Deconditioned Respiratory system: Clear to auscultation. Respiratory effort normal. RR 15 Cardiovascular system: S1 & S2 heard, regular rate and rhythm, no murmur.  No pedal edema. Gastrointestinal system: Abdomen is non distended, soft and non tender.  Normal bowel sounds heard. Central nervous system: Alert and oriented X 3. No focal neurological deficits. Extremities: No edema, no cyanosis, no clubbing. Skin: No rashes, lesions or ulcers Psychiatry: Judgement and insight appear normal. Mood & affect appropriate.     Data Reviewed: I have personally reviewed following labs and imaging studies  CBC: Recent Labs  Lab 01/10/23 1142 01/11/23 1153 01/12/23 0053 01/13/23 0430 01/14/23 0400  WBC 11.6* 8.2 7.4 6.0 5.6  NEUTROABS 10.2*  --   --   --   --   HGB 10.1* 9.1* 8.4* 9.3* 8.5*  HCT 30.9* 29.4* 26.7* 30.2* 27.0*  MCV 94 96.7 98.5 98.4 95.7  PLT 204 201 173 175 191   Basic Metabolic Panel: Recent Labs  Lab 01/08/23 1412 01/10/23 1142 01/11/23 1153 01/12/23 0053 01/13/23 0430 01/14/23 0400  NA 137 139 140 137 139 137  K 5.2* 4.2 4.0 3.5 4.0 4.0  CL 105 102 104 106 107 108  CO2 23 21 21* 22 22 23   GLUCOSE 107* 108* 137* 112* 100* 101*  BUN 28* 51* 59* 57* 39* 28*  CREATININE 1.72* 3.01* 3.06*  2.69* 2.36* 1.87*  CALCIUM 8.7* 8.1* 8.3* 7.7* 8.2* 7.9*  MG 1.7  --   --   --   --   --    GFR: Estimated Creatinine Clearance: 22.5 mL/min (A) (by C-G formula based on SCr of 1.87 mg/dL (H)). Liver Function Tests: Recent Labs  Lab 01/08/23 1412 01/10/23 1142 01/11/23 1153  AST 18 20 28   ALT 14 11 16   ALKPHOS 81 72 58  BILITOT 0.6 0.4 0.5  PROT 6.2* 5.3* 5.1*  ALBUMIN 3.0* 2.8* 2.2*   Recent Labs  Lab 01/08/23 1412 01/11/23 1153  LIPASE 40 39   No results for input(s): "AMMONIA" in the last 168 hours. Coagulation Profile: No results for input(s): "INR", "PROTIME" in the last 168 hours. Cardiac Enzymes: No results for input(s): "CKTOTAL", "CKMB", "CKMBINDEX", "TROPONINI" in the last 168 hours. BNP (last 3 results) Recent Labs    12/14/22 1011  PROBNP 1,661*   HbA1C: No results for  input(s): "HGBA1C" in the last 72 hours. CBG: Recent Labs  Lab 01/12/23 0906 01/12/23 0941 01/12/23 1146 01/12/23 1725  GLUCAP 75 85 131* 107*   Lipid Profile: No results for input(s): "CHOL", "HDL", "LDLCALC", "TRIG", "CHOLHDL", "LDLDIRECT" in the last 72 hours. Thyroid Function Tests: No results for input(s): "TSH", "T4TOTAL", "FREET4", "T3FREE", "THYROIDAB" in the last 72 hours. Anemia Panel: No results for input(s): "VITAMINB12", "FOLATE", "FERRITIN", "TIBC", "IRON", "RETICCTPCT" in the last 72 hours. Sepsis Labs: No results for input(s): "PROCALCITON", "LATICACIDVEN" in the last 168 hours.  Recent Results (from the past 240 hour(s))  Urine Culture     Status: Abnormal   Collection Time: 01/08/23  3:09 PM   Specimen: Urine, Catheterized  Result Value Ref Range Status   Specimen Description   Final    URINE, CATHETERIZED Performed at [facility], [address], [place], [place] [address]    Special Requests   Final    NONE Performed at [facility], [address] [place], [place] [address]    Culture >=100,000 COLONIES/mL SERRATIA MARCESCENS (A)  Final   Report  Status 01/10/2023 FINAL  Final   Organism ID, Bacteria SERRATIA MARCESCENS (A)  Final      Susceptibility   Serratia marcescens - MIC*    CEFEPIME <=0.12 SENSITIVE Sensitive     CEFTRIAXONE <=0.25 SENSITIVE Sensitive     CIPROFLOXACIN <=0.25 SENSITIVE Sensitive     GENTAMICIN <=1 SENSITIVE Sensitive     NITROFURANTOIN 256 RESISTANT Resistant     TRIMETH/SULFA <=20 SENSITIVE Sensitive     * >=100,000 COLONIES/mL SERRATIA MARCESCENS    Radiology Studies: No results found.  Scheduled Meds:  apixaban  2.5 mg Oral BID   atorvastatin  40 mg Oral Daily   Chlorhexidine Gluconate Cloth  6 each Topical Daily   docusate sodium  100 mg Oral BID   finasteride  5 mg Oral Daily   fluticasone furoate-vilanterol  1 puff Inhalation Daily   And   umeclidinium bromide  1 puff Inhalation Daily   gabapentin  300 mg Oral QHS   mirtazapine  7.5 mg Oral QHS   polycarbophil  625 mg Oral Daily   senna  1 tablet Oral Daily   sodium chloride flush  3 mL Intravenous Q12H   tamsulosin  0.4 mg Oral QPC supper   Continuous Infusions:  sodium chloride 50 mL/hr at 01/12/23 1649   cefTRIAXone (ROCEPHIN)  IV 1 g (01/14/23 0603)     LOS: 3 days    Time spent: 50 mins    Willeen Niece, MD [facility]   If 7PM-7AM, please contact night-coverage

## 2023-01-14 NOTE — Progress Notes (Signed)
   01/14/23 1151  Mobility  Activity Ambulated with assistance in hallway  Level of Assistance Contact guard assist, steadying assist  Assistive Device Front wheel walker  Distance Ambulated (ft) 80 ft  Activity Response Tolerated well  Mobility Referral Yes  $Mobility charge 1 Mobility  Mobility Specialist Start Time (ACUTE ONLY) 1113  Mobility Specialist Stop Time (ACUTE ONLY) 1144  Mobility Specialist Time Calculation (min) (ACUTE ONLY) 31 min   Mobility Specialist: Progress Note  Pt agreeable to mobility session - received in bed. Pt c/o knee weakness, drifting R. to L. during ambulation with two standing rest breaks. Pt returned to chair with all needs met - call bell within reach. Chair alarm on. Daughter present.   Barnie Mort, BS Mobility Specialist Please contact via SecureChat or Rehab office at [phone].

## 2023-01-14 NOTE — Care Management Important Message (Signed)
Important Message  Patient Details  Name: Charles Golden MRN: [id number] Date of Birth: [birth year]   Medicare Important Message Given:  Yes     Sherilyn Banker 01/14/2023, 2:54 PM

## 2023-01-15 ENCOUNTER — Telehealth: Payer: Self-pay | Admitting: Family Medicine

## 2023-01-15 DIAGNOSIS — N189 Chronic kidney disease, unspecified: Secondary | ICD-10-CM | POA: Diagnosis not present

## 2023-01-15 DIAGNOSIS — N179 Acute kidney failure, unspecified: Secondary | ICD-10-CM | POA: Diagnosis not present

## 2023-01-15 LAB — BASIC METABOLIC PANEL
Anion gap: 8 (ref 5–15)
BUN: 25 mg/dL — ABNORMAL HIGH (ref 8–23)
CO2: 22 mmol/L (ref 22–32)
Calcium: 7.8 mg/dL — ABNORMAL LOW (ref 8.9–10.3)
Chloride: 107 mmol/L (ref 98–111)
Creatinine, Ser: 1.82 mg/dL — ABNORMAL HIGH (ref 0.61–1.24)
GFR, Estimated: 34 mL/min — ABNORMAL LOW (ref >60)
Glucose, Bld: 119 mg/dL — ABNORMAL HIGH (ref 70–99)
Potassium: 4.2 mmol/L (ref 3.5–5.1)
Sodium: 137 mmol/L (ref 135–145)

## 2023-01-15 LAB — CBC
HCT: 26.8 % — ABNORMAL LOW (ref 39.0–52.0)
Hemoglobin: 8.3 g/dL — ABNORMAL LOW (ref 13.0–17.0)
MCH: 29.5 pg (ref 26.0–34.0)
MCHC: 31 g/dL (ref 30.0–36.0)
MCV: 95.4 fL (ref 80.0–100.0)
Platelets: 232 10*3/uL (ref 150–400)
RBC: 2.81 MIL/uL — ABNORMAL LOW (ref 4.22–5.81)
RDW: 14 % (ref 11.5–15.5)
WBC: 7 10*3/uL (ref 4.0–10.5)
nRBC: 0 % (ref 0.0–0.2)

## 2023-01-15 LAB — PHOSPHORUS: Phosphorus: 2.7 mg/dL (ref 2.5–4.6)

## 2023-01-15 LAB — MAGNESIUM: Magnesium: 1.8 mg/dL (ref 1.7–2.4)

## 2023-01-15 MED ORDER — SENNA 8.6 MG PO TABS
1.0000 | ORAL_TABLET | Freq: Two times a day (BID) | ORAL | Status: DC
  Administered 2023-01-15 – 2023-01-16 (×2): 8.6 mg via ORAL
  Filled 2023-01-15 (×2): qty 1

## 2023-01-15 NOTE — Telephone Encounter (Signed)
Spoke to daughter. Agree SNF is appropriate. Reached  out to hospitalist and voiced concerns.  He is working with PT to get Charles Golden assessed.

## 2023-01-15 NOTE — Consult Note (Signed)
   [facility] Inpatient Consult   01/15/2023  Charles Golden [birth year] [id number]  [facility] [[facility]]  Accountable Care Organization [ACO] Patient: Medicare ACO REACH  Primary Care Provider:  [name], DO with [facility] Medicine is listed to provide the transition of care follow up  Patient is showing as active with [facility] [[facility]] Care Management for chronic disease management services.  Patient has been assigned to a RN Care Coordinator and a history with Community LCSW noted in encounters.   Patient's electronic medical record reveals patient is extreme high risk score for unplanned readmission with less than 30 days readmission 2 admissions in 6 months and 7 ED visits in 6 months.   Met with patient, up I recliner, and daughter, Rayfield Citizen at the bedside regarding rounding visit.  SDOH reviewed and remains intact. Daughter voices concerns for patient's need for assistance when returning home acknowledging he is currently still weak likely from infection.  Plan:  Will reach out to [facility] Coordination team to and for updates and for any additional post hospital follow up needs.  Of note, [facility] Care Management services does not replace or interfere with any services that are needed or arranged by inpatient [facility] care management team.   For additional questions or referrals please contact:  Charlesetta Shanks, RN BSN CCM [facility]  [phone] business mobile phone Toll free office [phone]  Fax number: [phone] [email or web address]

## 2023-01-15 NOTE — Progress Notes (Signed)
   01/15/23 1145  Mobility  Activity Transferred from bed to chair  Level of Assistance Contact guard assist, steadying assist  Assistive Device Front wheel walker  Activity Response Tolerated well  Mobility Referral Yes  $Mobility charge 1 Mobility  Mobility Specialist Start Time (ACUTE ONLY) 1140  Mobility Specialist Stop Time (ACUTE ONLY) 1145  Mobility Specialist Time Calculation (min) (ACUTE ONLY) 5 min   Mobility Specialist: Progress Note  Pt agreeable to mobility session - received EOB. No pt complaints. Pt returned to chair with all needs met - call bell within reach. RN and daughter in the room.   Barnie Mort, BS Mobility Specialist Please contact via SecureChat or Rehab office at [phone].

## 2023-01-15 NOTE — Progress Notes (Signed)
PROGRESS NOTE    Charles Golden  EXB:[id number] DOB: 1925-11-08 DOA: 01/11/2023 PCP: [name], DO   Brief Narrative:  This 89 y.o. male with medical history significant of atrial fibrillation on Eliquis, CVA, COPD, CKD stage 3b, Nephrolithiasis, BPH, and GERD who presented for abnormal labs.  At baseline patient lives at home and ambulates with walker.  He reports that over the last 2 days he has not eaten much of anything due to complaints of nausea and decreased appetite.  He reports having nausea for the last week.  His daughter notes that he had fever up to 32 F yesterday at home and patient reports feeling globally weak.  He suffers from chronic constipation.  He is admitted for complicated UTI and AKI on CKD stage IIIb.  Assessment & Plan:   Principal Problem:   Acute kidney injury superimposed on chronic kidney disease (HCC) Active Problems:   Complicated urinary tract infection   Debility   Heart failure with preserved ejection fraction (HCC)   Paroxysmal atrial fibrillation (HCC)   History of CVA (cerebrovascular accident)   COPD GOLD I    Hyperlipidemia   BPH (benign prostatic hyperplasia)   Normocytic anemia  AKI on CKD stage IIIb: Creatinine elevated up to 3.06 with BUN 59.   Patient had not been eating or drinking much for the last couple days with reports of decreased appetite and nausea.   Continue IV fluid resuscitation, avoid nephrotoxic medications. Serum creatinine is improving. 3.06 > 2.82 > 2.36 > 1.87 >1.82   Complicated urinary tract infection: Acute. Patient has had chronic indwelling Foley since July.   Urine culture from 3 days ago grew out Serratia marcesscens.  Patient had been given cephalexin and received a dose of Rocephin IV yesterday.   Urinalysis still noted moderate leukocytes, rare bacteria, and 21-50 WBCs.  Initially given Zosyn in the ED. Foley exchanged in the ED Antibiotics changed to IV Rocephin. Patient has appointment with  urology on 01/17/23.   Generalized Weakness / Debility: Patient participated well with PT, no follow-up.   Normocytic anemia: Hemoglobin 9.1 which appears to be possibly 2 g lower than when checked on 9/13.   Patient also reports dark colored stools Trend H/H /CBC. Check stools for occult blood.   Heart failure with preserved EF: Patient does not appear grossly fluid overloaded at this time.   Last available echocardiogram noted EF to be 60 to 65% with grade 2 diastolic dysfunction back in 2022.    Monitor intake and output. Daily weights.   Paroxysmal atrial fibrillation on chronic anticoagulation: HR well controlled. Continue Eliquis.   History of CVA: Carotid artery stenosis status post ICA stent Patient with prior history of CVAs back in 04/2021 for which she underwent left carotid angioplasty and stenting by interventional radiology. Continue Eliquis.   COPD, without acute exacerbation: Continue pharmacy substitution of Trelegy Continue Albuterol nebs as needed for shortness of breath/wheezing   Hyperlipidemia: Continue atorvastatin.   BPH with urinary obstruction Continue Foley catheter Continue finasteride and pharmacy substitution of tamsulosin.   Constipation Continue bowel regimen ( Colace, senokot)   Delirium precuations    DVT prophylaxis: Eliquis Code Status:Full code. Family Communication: daughter at bed side. Disposition Plan:     Status is: Inpatient Remains inpatient appropriate because: Admitted for complicated UTI and AKI.   Consultants:  None  Procedures: None  Antimicrobials:  Anti-infectives (From admission, onward)    Start     Dose/Rate Route Frequency Ordered Stop  01/12/23 0600  cefTRIAXone (ROCEPHIN) 1 g in sodium chloride 0.9 % 100 mL IVPB        1 g 200 mL/hr over 30 Minutes Intravenous Every 24 hours 01/11/23 1729     01/11/23 1630  piperacillin-tazobactam (ZOSYN) IVPB 3.375 g        3.375 g 100 mL/hr over 30 Minutes  Intravenous  Once 01/11/23 1628 01/11/23 1715       Subjective: Patient was seen and examined at bedside.  Overnight events noted. Patient denies any pain or burning urination. Patient states he has not had a bowel movement in 7 days.   Objective: Vitals:   01/14/23 2000 01/15/23 0724 01/15/23 0823 01/15/23 1331  BP: (!) 160/80 (!) 148/77  127/62  Pulse: 77 73  80  Resp: 16 17  17   Temp: 98 F (36.7 C) 98.1 F (36.7 C)  99.3 F (37.4 C)  TempSrc: Oral Oral  Oral  SpO2: 100% 98% 98% (!) 87%    Intake/Output Summary (Last 24 hours) at 01/15/2023 1421 Last data filed at 01/15/2023 0530 Gross per 24 hour  Intake --  Output 1600 ml  Net -1600 ml   There were no vitals filed for this visit.  Examination:  General exam: Appears calm and comfortable, deconditioned, not in any acute distress. Respiratory system: Clear to auscultation. Respiratory effort normal. RR 14 Cardiovascular system: S1 & S2 heard, regular rate and rhythm, no murmur.  No pedal edema. Gastrointestinal system: Abdomen is non distended, soft and non tender.  Normal bowel sounds heard. Central nervous system: Alert and oriented X 3. No focal neurological deficits. Extremities: No edema, no cyanosis, no clubbing. Skin: No rashes, lesions or ulcers Psychiatry: Judgement and insight appear normal. Mood & affect appropriate.     Data Reviewed: I have personally reviewed following labs and imaging studies  CBC: Recent Labs  Lab 01/10/23 1142 01/11/23 1153 01/12/23 0053 01/13/23 0430 01/14/23 0400 01/15/23 0140  WBC 11.6* 8.2 7.4 6.0 5.6 7.0  NEUTROABS 10.2*  --   --   --   --   --   HGB 10.1* 9.1* 8.4* 9.3* 8.5* 8.3*  HCT 30.9* 29.4* 26.7* 30.2* 27.0* 26.8*  MCV 94 96.7 98.5 98.4 95.7 95.4  PLT 204 201 173 175 191 232   Basic Metabolic Panel: Recent Labs  Lab 01/11/23 1153 01/12/23 0053 01/13/23 0430 01/14/23 0400 01/15/23 0140  NA 140 137 139 137 137  K 4.0 3.5 4.0 4.0 4.2  CL 104 106 107  108 107  CO2 21* 22 22 23 22   GLUCOSE 137* 112* 100* 101* 119*  BUN 59* 57* 39* 28* 25*  CREATININE 3.06* 2.69* 2.36* 1.87* 1.82*  CALCIUM 8.3* 7.7* 8.2* 7.9* 7.8*  MG  --   --   --   --  1.8  PHOS  --   --   --   --  2.7   GFR: Estimated Creatinine Clearance: 23.1 mL/min (A) (by C-G formula based on SCr of 1.82 mg/dL (H)). Liver Function Tests: Recent Labs  Lab 01/10/23 1142 01/11/23 1153  AST 20 28  ALT 11 16  ALKPHOS 72 58  BILITOT 0.4 0.5  PROT 5.3* 5.1*  ALBUMIN 2.8* 2.2*   Recent Labs  Lab 01/11/23 1153  LIPASE 39   No results for input(s): "AMMONIA" in the last 168 hours. Coagulation Profile: No results for input(s): "INR", "PROTIME" in the last 168 hours. Cardiac Enzymes: No results for input(s): "CKTOTAL", "CKMB", "CKMBINDEX", "TROPONINI" in the  last 168 hours. BNP (last 3 results) Recent Labs    12/14/22 1011  PROBNP 1,661*   HbA1C: No results for input(s): "HGBA1C" in the last 72 hours. CBG: Recent Labs  Lab 01/12/23 0906 01/12/23 0941 01/12/23 1146 01/12/23 1725  GLUCAP 75 85 131* 107*   Lipid Profile: No results for input(s): "CHOL", "HDL", "LDLCALC", "TRIG", "CHOLHDL", "LDLDIRECT" in the last 72 hours. Thyroid Function Tests: No results for input(s): "TSH", "T4TOTAL", "FREET4", "T3FREE", "THYROIDAB" in the last 72 hours. Anemia Panel: No results for input(s): "VITAMINB12", "FOLATE", "FERRITIN", "TIBC", "IRON", "RETICCTPCT" in the last 72 hours. Sepsis Labs: No results for input(s): "PROCALCITON", "LATICACIDVEN" in the last 168 hours.  Recent Results (from the past 240 hour(s))  Urine Culture     Status: Abnormal   Collection Time: 01/08/23  3:09 PM   Specimen: Urine, Catheterized  Result Value Ref Range Status   Specimen Description   Final    URINE, CATHETERIZED Performed at [facility], [address] [place], [place] [address]    Special Requests   Final    NONE Performed at [facility], [address] [place], [place]  [address]    Culture >=100,000 COLONIES/mL SERRATIA MARCESCENS (A)  Final   Report Status 01/10/2023 FINAL  Final   Organism ID, Bacteria SERRATIA MARCESCENS (A)  Final      Susceptibility   Serratia marcescens - MIC*    CEFEPIME <=0.12 SENSITIVE Sensitive     CEFTRIAXONE <=0.25 SENSITIVE Sensitive     CIPROFLOXACIN <=0.25 SENSITIVE Sensitive     GENTAMICIN <=1 SENSITIVE Sensitive     NITROFURANTOIN 256 RESISTANT Resistant     TRIMETH/SULFA <=20 SENSITIVE Sensitive     * >=100,000 COLONIES/mL SERRATIA MARCESCENS    Radiology Studies: No results found.  Scheduled Meds:  apixaban  2.5 mg Oral BID   atorvastatin  40 mg Oral Daily   Chlorhexidine Gluconate Cloth  6 each Topical Daily   docusate sodium  100 mg Oral BID   finasteride  5 mg Oral Daily   fluticasone furoate-vilanterol  1 puff Inhalation Daily   And   umeclidinium bromide  1 puff Inhalation Daily   gabapentin  300 mg Oral QHS   mirtazapine  7.5 mg Oral QHS   polycarbophil  625 mg Oral Daily   senna  1 tablet Oral BID   sodium chloride flush  3 mL Intravenous Q12H   tamsulosin  0.4 mg Oral QPC supper   Continuous Infusions:  sodium chloride 50 mL/hr at 01/15/23 1145   cefTRIAXone (ROCEPHIN)  IV 1 g (01/15/23 0558)     LOS: 4 days    Time spent: 35 mins    Willeen Niece, MD [facility]   If 7PM-7AM, please contact night-coverage

## 2023-01-15 NOTE — Evaluation (Signed)
Physical Therapy Evaluation Patient Details Name: Charles Golden MRN: [id number] DOB: 1926/03/08 Today's Date: 01/15/2023  History of Present Illness  89 year old male admitted on 01/11/23 for AKI and UTI. Past medical history significant of atrial fibrillation on Eliquis, CVA, COPD, CKD stage 3b, nephrolithiasis, BPH, and GERD.  Clinical Impression  Pt presents to PT with deficits in strength, power, balance, endurance. Pt is able to ambulate with support of RW at this time, however typically ambulates without DME at baseline. Pt performs 5x sit to stand in 22.68 seconds, norms for 90+ age group are 19.5+/-2.3 seconds for males. PT encourages frequent ambulation with assistance of staff or family along with use of RW. PT will follow up during this admission to avoid deconditioning.        If plan is discharge home, recommend the following: A little help with bathing/dressing/bathroom;Assistance with cooking/housework;Assist for transportation;Help with stairs or ramp for entrance   Can travel by private vehicle        Equipment Recommendations None recommended by PT  Recommendations for Other Services       Functional Status Assessment Patient has had a recent decline in their functional status and demonstrates the ability to make significant improvements in function in a reasonable and predictable amount of time.     Precautions / Restrictions Precautions Precautions: Fall Restrictions Weight Bearing Restrictions: No      Mobility  Bed Mobility                    Transfers Overall transfer level: Modified independent Equipment used: Rolling walker (2 wheels) Transfers: Sit to/from Stand Sit to Stand: Modified independent (Device/Increase time)           General transfer comment: 5 time sit to stand in 22.68 seconds    Ambulation/Gait Ambulation/Gait assistance: Modified independent (Device/Increase time) Gait Distance (Feet): 100 Feet (100' x 2  trials) Assistive device: Rolling walker (2 wheels) Gait Pattern/deviations: Step-through pattern Gait velocity: reduced Gait velocity interpretation: 1.31 - 2.62 ft/sec, indicative of limited community ambulator   General Gait Details: steady step-through gait with support of RW  Stairs            Wheelchair Mobility     Tilt Bed    Modified Rankin (Stroke Patients Only)       Balance Overall balance assessment: Needs assistance Sitting-balance support: No upper extremity supported, Feet supported Sitting balance-Leahy Scale: Good     Standing balance support: Single extremity supported, Reliant on assistive device for balance Standing balance-Leahy Scale: Poor                               Pertinent Vitals/Pain Pain Assessment Pain Assessment: No/denies pain    Home Living Family/patient expects to be discharged to:: Private residence Living Arrangements: Alone Available Help at Discharge: Family;Available PRN/intermittently Type of Home: House Home Access: Stairs to enter Entrance Stairs-Rails: Can reach both Entrance Stairs-Number of Steps: 2   Home Layout: One level Home Equipment: Agricultural consultant (2 wheels);Shower seat;Grab bars - toilet;Grab bars - tub/shower      Prior Function Prior Level of Function : Independent/Modified Independent             Mobility Comments: ambulatory with PRN use of RW       Extremity/Trunk Assessment   Upper Extremity Assessment Upper Extremity Assessment: Overall WFL for tasks assessed    Lower Extremity Assessment Lower Extremity Assessment:  Generalized weakness    Cervical / Trunk Assessment Cervical / Trunk Assessment: Kyphotic  Communication   Communication Communication: No apparent difficulties Cueing Techniques: Verbal cues  Cognition Arousal: Alert Behavior During Therapy: WFL for tasks assessed/performed Overall Cognitive Status: Within Functional Limits for tasks assessed                                           General Comments General comments (skin integrity, edema, etc.): VSS on RA    Exercises     Assessment/Plan    PT Assessment Patient needs continued PT services  PT Problem List Decreased strength;Decreased activity tolerance;Decreased balance;Decreased mobility;Decreased knowledge of use of DME       PT Treatment Interventions DME instruction;Gait training;Stair training;Functional mobility training;Balance training;Neuromuscular re-education;Therapeutic exercise;Therapeutic activities;Patient/family education    PT Goals (Current goals can be found in the Care Plan section)  Acute Rehab PT Goals Patient Stated Goal: to return to prior level of independence PT Goal Formulation: With patient Time For Goal Achievement: 01/29/23 Potential to Achieve Goals: Good    Frequency Min 1X/week     Co-evaluation               AM-PAC PT "6 Clicks" Mobility  Outcome Measure Help needed turning from your back to your side while in a flat bed without using bedrails?: None Help needed moving from lying on your back to sitting on the side of a flat bed without using bedrails?: None Help needed moving to and from a bed to a chair (including a wheelchair)?: None Help needed standing up from a chair using your arms (e.g., wheelchair or bedside chair)?: None Help needed to walk in hospital room?: None Help needed climbing 3-5 steps with a railing? : A Little 6 Click Score: 23    End of Session Equipment Utilized During Treatment: Gait belt Activity Tolerance: Patient tolerated treatment well Patient left: in chair;with call bell/phone within reach;with family/visitor present Nurse Communication: Mobility status PT Visit Diagnosis: Other abnormalities of gait and mobility (R26.89);Muscle weakness (generalized) (M62.81)    Time: 1501-1530 PT Time Calculation (min) (ACUTE ONLY): 29 min   Charges:   PT Evaluation $PT Eval Low  Complexity: 1 Low   PT General Charges $$ ACUTE PT VISIT: 1 Visit         Arlyss Gandy, PT, DPT Acute Rehabilitation Office [phone]   Arlyss Gandy 01/15/2023, 3:48 PM

## 2023-01-16 ENCOUNTER — Encounter: Payer: Self-pay | Admitting: Family Medicine

## 2023-01-16 DIAGNOSIS — N189 Chronic kidney disease, unspecified: Secondary | ICD-10-CM | POA: Diagnosis not present

## 2023-01-16 DIAGNOSIS — N179 Acute kidney failure, unspecified: Secondary | ICD-10-CM | POA: Diagnosis not present

## 2023-01-16 LAB — BASIC METABOLIC PANEL
Anion gap: 9 (ref 5–15)
BUN: 23 mg/dL (ref 8–23)
CO2: 21 mmol/L — ABNORMAL LOW (ref 22–32)
Calcium: 8.1 mg/dL — ABNORMAL LOW (ref 8.9–10.3)
Chloride: 108 mmol/L (ref 98–111)
Creatinine, Ser: 1.78 mg/dL — ABNORMAL HIGH (ref 0.61–1.24)
GFR, Estimated: 35 mL/min — ABNORMAL LOW (ref >60)
Glucose, Bld: 96 mg/dL (ref 70–99)
Potassium: 4.4 mmol/L (ref 3.5–5.1)
Sodium: 138 mmol/L (ref 135–145)

## 2023-01-16 LAB — CBC
HCT: 29.6 % — ABNORMAL LOW (ref 39.0–52.0)
Hemoglobin: 8.9 g/dL — ABNORMAL LOW (ref 13.0–17.0)
MCH: 30.3 pg (ref 26.0–34.0)
MCHC: 30.1 g/dL (ref 30.0–36.0)
MCV: 100.7 fL — ABNORMAL HIGH (ref 80.0–100.0)
Platelets: 276 10*3/uL (ref 150–400)
RBC: 2.94 MIL/uL — ABNORMAL LOW (ref 4.22–5.81)
RDW: 14 % (ref 11.5–15.5)
WBC: 8.7 10*3/uL (ref 4.0–10.5)
nRBC: 0 % (ref 0.0–0.2)

## 2023-01-16 LAB — MAGNESIUM: Magnesium: 1.8 mg/dL (ref 1.7–2.4)

## 2023-01-16 LAB — PHOSPHORUS: Phosphorus: 3 mg/dL (ref 2.5–4.6)

## 2023-01-16 MED ORDER — CEPHALEXIN 500 MG PO CAPS: 500.0000 mg | ORAL_CAPSULE | Freq: Four times a day (QID) | ORAL | 0 refills | Status: AC

## 2023-01-16 MED ORDER — CALCIUM POLYCARBOPHIL 625 MG PO TABS: 625.0000 mg | ORAL_TABLET | Freq: Every day | ORAL | 0 refills | Status: AC

## 2023-01-16 MED ORDER — SENNA 8.6 MG PO TABS: 1.0000 | ORAL_TABLET | Freq: Two times a day (BID) | ORAL | 0 refills | Status: AC

## 2023-01-16 NOTE — Progress Notes (Signed)
   01/16/23 1124  Mobility  Activity Ambulated with assistance in hallway  Level of Assistance Modified independent, requires aide device or extra time  Assistive Device Front wheel walker  Distance Ambulated (ft) 60 ft  Activity Response Tolerated well  Mobility Referral Yes  $Mobility charge 1 Mobility  Mobility Specialist Start Time (ACUTE ONLY) 1106  Mobility Specialist Stop Time (ACUTE ONLY) 1121  Mobility Specialist Time Calculation (min) (ACUTE ONLY) 15 min   Mobility Specialist: Progress Note  Pt agreeable to mobility session - received in chair. No patient complaints. Pt given cues to pace himself during ambulation. Pt returned to chair with all needs met - call bell within reach. Chair alarm on. Family present.   Barnie Mort, BS Mobility Specialist Please contact via SecureChat or Rehab office at [phone].

## 2023-01-16 NOTE — Progress Notes (Signed)
Patient discharged, discharge instructions given and explained to patient and daughter. No additional questions and or concerns at this time. Leg bag given and explained on usage and care, daughter and patient states understanding. Patient IV removed per protocol. Patient discharged via wheelchair for safety.

## 2023-01-16 NOTE — Evaluation (Signed)
Occupational Therapy Evaluation Patient Details Name: Charles Golden MRN: [id number] DOB: 02-Dec-1925 Today's Date: 01/16/2023   History of Present Illness 89 year old male admitted on 01/11/23 for AKI and UTI. Past medical history significant of atrial fibrillation on Eliquis, CVA, COPD, CKD stage 3b, nephrolithiasis, BPH, and GERD.   Clinical Impression   Pt has been using a RW for ambulation and completing basic self care, sitting to shower. His family assists with IADLs and pt has not been driving in some time. He presents with generalized weakness and impaired standing balance. He requires up to supervision for mobility and ADLs. Recommending HHOT upon discharge. Pt to return home today.       If plan is discharge home, recommend the following: A little help with bathing/dressing/bathroom;Assistance with cooking/housework;Direct supervision/assist for medications management;Direct supervision/assist for financial management;Assist for transportation;Help with stairs or ramp for entrance    Functional Status Assessment  Patient has had a recent decline in their functional status and demonstrates the ability to make significant improvements in function in a reasonable and predictable amount of time.  Equipment Recommendations  None recommended by OT    Recommendations for Other Services       Precautions / Restrictions Precautions Precautions: Fall Restrictions Weight Bearing Restrictions: No      Mobility Bed Mobility Overal bed mobility: Modified Independent                  Transfers Overall transfer level: Modified independent Equipment used: Rolling walker (2 wheels)               General transfer comment: slow to rise, no assist      Balance Overall balance assessment: Needs assistance   Sitting balance-Leahy Scale: Good Sitting balance - Comments: no LOB donning socks   Standing balance support: Bilateral upper extremity supported Standing  balance-Leahy Scale: Poor                             ADL either performed or assessed with clinical judgement   ADL Overall ADL's : Needs assistance/impaired Eating/Feeding: Independent;Sitting   Grooming: Set up   Upper Body Bathing: Set up;Sitting   Lower Body Bathing: Supervison/ safety;Sit to/from stand   Upper Body Dressing : Set up;Sitting   Lower Body Dressing: Supervision/safety;Sit to/from stand   Toilet Transfer: Supervision/safety;Ambulation;Rolling walker (2 wheels)           Functional mobility during ADLs: Supervision/safety;Rolling walker (2 wheels)       Vision Baseline Vision/History: 1 Wears glasses Ability to See in Adequate Light: 0 Adequate Patient Visual Report: No change from baseline       Perception         Praxis         Pertinent Vitals/Pain Pain Assessment Pain Assessment: No/denies pain     Extremity/Trunk Assessment Upper Extremity Assessment Upper Extremity Assessment: Generalized weakness   Lower Extremity Assessment Lower Extremity Assessment: Defer to PT evaluation   Cervical / Trunk Assessment Cervical / Trunk Assessment: Kyphotic   Communication Communication Communication: Hearing impairment   Cognition Arousal: Alert Behavior During Therapy: WFL for tasks assessed/performed Overall Cognitive Status: Impaired/Different from baseline Area of Impairment: Memory, Following commands, Problem solving                     Memory: Decreased short-term memory Following Commands: Follows one step commands with increased time     Problem Solving: Slow processing,  Decreased initiation, Requires verbal cues       General Comments       Exercises     Shoulder Instructions      Home Living Family/patient expects to be discharged to:: Private residence Living Arrangements: Alone Available Help at Discharge: Family;Available PRN/intermittently Type of Home: House Home Access: Stairs to  enter [employer] of Steps: 2 Entrance Stairs-Rails: Can reach both Home Layout: One level     Bathroom Shower/Tub: Producer, television/film/video: Handicapped height     Home Equipment: Agricultural consultant (2 wheels);Shower seat;Grab bars - toilet;Grab bars - tub/shower   Additional Comments: lives alone, three daughter assists as needed, one lives across street      Prior Functioning/Environment Prior Level of Function : Needs assist             Mobility Comments: walking with RW x 3 months ADLs Comments: assist for IADLs, managing meds, appointments, has not driven in some time        OT Problem List: Decreased strength;Impaired balance (sitting and/or standing);Decreased cognition;Decreased safety awareness      OT Treatment/Interventions:      OT Goals(Current goals can be found in the care plan section)    OT Frequency:      Co-evaluation              AM-PAC OT "6 Clicks" Daily Activity     Outcome Measure Help from another person eating meals?: None Help from another person taking care of personal grooming?: A Little Help from another person toileting, which includes using toliet, bedpan, or urinal?: A Little Help from another person bathing (including washing, rinsing, drying)?: A Little Help from another person to put on and taking off regular upper body clothing?: A Little Help from another person to put on and taking off regular lower body clothing?: A Little 6 Click Score: 19   End of Session Equipment Utilized During Treatment: Rolling walker (2 wheels);Gait belt  Activity Tolerance: Patient tolerated treatment well Patient left: in chair;with call bell/phone within reach;with chair alarm set;with family/visitor present  OT Visit Diagnosis: Unsteadiness on feet (R26.81);Other symptoms and signs involving cognitive function                Time: 1610-9604 OT Time Calculation (min): 54 min Charges:  OT General Charges $OT Visit: 1  Visit OT Evaluation $OT Eval Moderate Complexity: 1 Mod OT Treatments $Self Care/Home Management : 23-37 mins  Berna Spare, OTR/L Acute Rehabilitation Services Office: [phone]   Evern Bio 01/16/2023, 10:51 AM

## 2023-01-16 NOTE — Discharge Summary (Signed)
Physician Discharge Summary  Charles Golden WGN:[id number] DOB: [birth year] DOA: 01/11/2023  PCP: [name], DO  Admit date: 01/11/2023 Discharge date: 01/16/2023  Admitted From: Home Disposition: Home  Recommendations for Outpatient Follow-up:  Follow up with PCP in 1-2 weeks Follow-up with urology at scheduled appointment 8/22 Follow-up with cardiology as scheduled  Home Health: None Equipment/Devices: None  Discharge Condition: Stable CODE STATUS: Full Diet recommendation: Liberalize diet given poor p.o. intake  Brief/Interim Summary: This 89 y.o. male with medical history significant of atrial fibrillation on Eliquis, CVA, COPD, CKD stage 3b, Nephrolithiasis, BPH, and GERD who presented for abnormal labs.  At baseline patient lives at home and ambulates with walker.  He reports that over the last 2 days he has not eaten much of anything due to complaints of nausea and decreased appetite.  He reports having nausea for the last week.  His daughter notes that he had fever up to 81 F yesterday at home and patient reports feeling globally weak.  He suffers from chronic constipation.  He is admitted for complicated UTI and AKI on CKD stage IIIb.   Patient's renal function continues to improve, increase p.o. intake has become more appropriate but remains somewhat diminished from goal.  Discussed with family need for ongoing encouragement in regards to p.o. intake with both nutrition and fluids.  Patient's complicated urinary tract infection appears to be resolving, continue new Foley placed 8/17 transition from ceftriaxone to p.o. cephalexin per sensitivities from prior micro in the outpatient setting.  Patient continues to have some ongoing weakness and debility although markedly improved from admission.  Initial plan was for discharge to skilled nursing facility however given patient's marked improvement he walker will qualify for any ongoing inpatient physical therapy, however we will  set up outpatient therapy at home to continue.  Patient's other chronic comorbid conditions appear to be stable.  Patient did have notable anemia, likely somewhat hemodilutional with notably dark stools, patient is also on iron, given stable if not uptrending hemoglobin today will discharge with outpatient follow-up.  No notable bleeding easy bruising noted during hospitalization  Discharge Diagnoses:  Principal Problem:   Acute kidney injury superimposed on chronic kidney disease (HCC) Active Problems:   Complicated urinary tract infection   Debility   Heart failure with preserved ejection fraction (HCC)   Paroxysmal atrial fibrillation (HCC)   History of CVA (cerebrovascular accident)   COPD GOLD I    Hyperlipidemia   BPH (benign prostatic hyperplasia)   Normocytic anemia  AKI on CKD stage IIIb, resolving: Creatinine elevated up to 3.06 -downtrending back to baseline around 1.5.  Most likely prerenal given discussion with patient and family given his poor p.o. intake over the past few months in regards to nutrition as well as fluids. -Recommended family to liberalize patient's diet in the interim as we discussed caloric intake and fluid intake was more important than focusing on low-salt, low fat, or low sugar meals.   Complicated urinary tract infection, POA: Acute. Patient has had chronic indwelling Foley since July -exchanged 8/17 -Recent culture grew Serratia marcescens, sensitivity to cephalosporins as well as Bactrim. -Transition from ceftriaxone to cephalexin to complete 10-day course -Patient has appointment with urology on 01/17/23.   Generalized Weakness / Debility: Patient participated well with PT, no follow-up.   Normocytic anemia: Hemoglobin stable, uptrending over the past 24 hours without further episode of dark stool   Heart failure with preserved EF: 2022 -echocardiogram noted EF to be 60 to 65% with  grade 2 diastolic dysfunction   Appears euvolemic, no  indication for further evaluation or testing   Paroxysmal atrial fibrillation on chronic anticoagulation: HR well controlled. Continue Eliquis.   History of CVA: Carotid artery stenosis status post ICA stent Patient with prior history of CVAs back in 04/2021 for which she underwent left carotid angioplasty and stenting by interventional radiology. Continue Eliquis -discontinue aspirin per discussion with family given patient's fall risk age and antiplatelet as well as full dose anticoagulation in the setting of acute on chronic anemia   COPD, without acute exacerbation: At baseline, without acute exacerbation, continue home regimen   Hyperlipidemia: Continue atorvastatin.   BPH with urinary obstruction Continue Foley catheter replaced 01/12/2023 Continue home meds, urology appointment tomorrow   Constipation Likely exacerbated by poor p.o. intake, dehydration Continue bowel regimen ( Colace, senokot)   Discharge Instructions  Discharge Instructions     Discharge patient   Complete by: As directed    Discharge disposition: 06-Home-Health Care Svc   Discharge patient date: 01/16/2023      Allergies as of 01/16/2023       Reactions   Demerol [meperidine] Other (See Comments)   Pt. States "he woke up during a colonoscopy"   Spiriva Handihaler [tiotropium Bromide Monohydrate] Other (See Comments)   Mild Urinary Retention        Medication List     STOP taking these medications    aspirin EC 81 MG tablet   ondansetron 4 MG disintegrating tablet Commonly known as: ZOFRAN-ODT   oxyCODONE-acetaminophen 5-325 MG tablet Commonly known as: PERCOCET/ROXICET       TAKE these medications    acetaminophen 500 MG tablet Commonly known as: TYLENOL Take 1,000 mg by mouth daily as needed for headache or moderate pain.   atorvastatin 40 MG tablet Commonly known as: LIPITOR Take 1 tablet (40 mg total) by mouth daily.   azelastine 0.1 % nasal spray Commonly known as:  ASTELIN PLACE 1 SPRAY IN EACH NOSTRIL ONCE A DAY AS DIRECTED What changed:  how much to take how to take this when to take this additional instructions   cephALEXin 500 MG capsule Commonly known as: KEFLEX Take 1 capsule (500 mg total) by mouth 4 (four) times daily for 7 days.   CRANBERRY PO Take 1 tablet by mouth every evening.   docusate sodium 100 MG capsule Commonly known as: COLACE Take 1 capsule (100 mg total) by mouth every evening.   Eliquis 2.5 MG Tabs tablet Generic drug: apixaban Take 1 tablet (2.5 mg total) by mouth 2 (two) times daily.   ferrous sulfate 325 (65 FE) MG EC tablet Take 325 mg by mouth daily.   finasteride 5 MG tablet Commonly known as: PROSCAR Take 5 mg by mouth daily.   fluticasone 50 MCG/ACT nasal spray Commonly known as: FLONASE Place 2 sprays into both nostrils daily. What changed: when to take this   gabapentin 300 MG capsule Commonly known as: NEURONTIN Take 300 mg by mouth at bedtime.   latanoprost 0.005 % ophthalmic solution Commonly known as: XALATAN Place 1 drop into both eyes at bedtime.   Lokelma 10 g Pack packet Generic drug: sodium zirconium cyclosilicate Take 10 g by mouth 2 (two) times daily.   lubiprostone 24 MCG capsule Commonly known as: AMITIZA Take 1 capsule (24 mcg total) by mouth 2 (two) times daily with a meal. For constipation   mirtazapine 7.5 MG tablet Commonly known as: REMERON Take 1 tablet (7.5 mg total) by mouth at  bedtime.   polycarbophil 625 MG tablet Commonly known as: FIBERCON Take 1 tablet (625 mg total) by mouth daily. Start taking on: January 17, 2023   senna 8.6 MG Tabs tablet Commonly known as: SENOKOT Take 1 tablet (8.6 mg total) by mouth 2 (two) times daily.   silodosin 8 MG Caps capsule Commonly known as: RAPAFLO Take 8 mg by mouth daily.   timolol 0.5 % ophthalmic solution Commonly known as: TIMOPTIC Place 1 drop into the left eye every morning.   Trelegy Ellipta 100-62.5-25  MCG/ACT Aepb Generic drug: Fluticasone-Umeclidin-Vilant Inhale 1 puff into the lungs every evening. What changed: when to take this   Ventolin HFA 108 (90 Base) MCG/ACT inhaler Generic drug: albuterol INHALE 2 PUFFS EVERY 6 HOURS AS NEEDED FOR WHEEZING OR SHORTNESS OF BREATH   VITAMIN B-12 PO Take 1 tablet by mouth every evening.   vitamin D3 25 MCG tablet Commonly known as: CHOLECALCIFEROL Take 1 tablet (1,000 Units total) by mouth every evening.        Follow-up Information     Delynn Flavin M, DO Follow up.   Specialty: Family Medicine Contact information: [address] [place] [address] [phone]         [facility] Follow up.   Specialty: Home Health Services Why: Resumption of home health services  within 48 hours post discharge. Contact information: [address] [place] [address] [phone]                Allergies  Allergen Reactions   Demerol [Meperidine] Other (See Comments)    Pt. States "he woke up during a colonoscopy"   Spiriva Handihaler [Tiotropium Bromide Monohydrate] Other (See Comments)    Mild Urinary Retention    Consultations: Urology   Procedures/Studies: CT ABDOMEN PELVIS WO CONTRAST  Result Date: 01/08/2023 CLINICAL DATA:  Abdominal pain. EXAM: CT ABDOMEN AND PELVIS WITHOUT CONTRAST TECHNIQUE: Multidetector CT imaging of the abdomen and pelvis was performed following the standard protocol without IV contrast. RADIATION DOSE REDUCTION: This exam was performed according to the departmental dose-optimization program which includes automated exposure control, adjustment of the mA and/or kV according to patient size and/or use of iterative reconstruction technique. COMPARISON:  CT abdomen pelvis dated 12/17/2022. FINDINGS: Evaluation of this exam is limited in the absence of intravenous contrast. Lower chest: Bibasilar subpleural atelectasis. There is coronary vascular calcification. No  intra-abdominal free air or free fluid. Hepatobiliary: The liver is unremarkable. No biliary dilatation. The gallbladder is unremarkable. Pancreas: Unremarkable. No pancreatic ductal dilatation or surrounding inflammatory changes. Spleen: Normal in size without focal abnormality. Adrenals/Urinary Tract: The adrenal glands are unremarkable. Multiple nonobstructing bilateral renal calculi measure up to 7 mm in the interpolar right kidney. A small left renal interpolar cyst. No imaging follow-up. Interval resolution of the previously seen left-sided hydronephrosis. There is mild residual hydronephrosis on the right. There is right perinephric stranding. Correlation with urinalysis recommended to exclude superimposed UTI. A 5 mm stone along the right posterior bladder wall close to the ureterovesical junction may represent a recently passed right renal calculus versus a right UVJ stone. The urinary bladder is decompressed around a Foley catheter. Stomach/Bowel: There is sigmoid diverticulosis without active inflammatory changes. There is no bowel obstruction or active inflammation. The appendix is normal. Vascular/Lymphatic: Advanced aortoiliac atherosclerotic disease. The IVC is unremarkable. No portal venous gas. There is no adenopathy. Reproductive: Enlarged prostate gland measuring 7 cm in transverse axial diameter. The seminal vesicles are symmetric. Other:  None Musculoskeletal: Osteopenia with scoliosis and degenerative changes. No acute osseous pathology. IMPRESSION: 1. A 5 mm recently passed right renal calculus in the urinary bladder versus a right UVJ stone. Mild residual right-sided hydronephrosis. Correlation with urinalysis recommended to exclude superimposed UTI. 2. Multiple nonobstructing bilateral renal calculi. No hydronephrosis on the left. 3. Sigmoid diverticulosis. No bowel obstruction. Normal appendix. 4. Enlarged prostate gland. 5.  Aortic Atherosclerosis (ICD10-I70.0). Electronically Signed   By:  Elgie Collard M.D.   On: 01/08/2023 16:49     Subjective: No acute issues or events overnight feels back to baseline otherwise denies nausea vomiting diarrhea constipation any fevers chills or chest pain   Discharge Exam: Vitals:   01/16/23 0430 01/16/23 0726  BP: 139/70 132/66  Pulse: 74 77  Resp: 15 16  Temp: 98.5 F (36.9 C) 98.3 F (36.8 C)  SpO2: 100% 96%   Vitals:   01/15/23 1331 01/15/23 1953 01/16/23 0430 01/16/23 0726  BP: 127/62 (!) 110/56 139/70 132/66  Pulse: 80 72 74 77  Resp: 17 17 15 16   Temp: 99.3 F (37.4 C) 98.6 F (37 C) 98.5 F (36.9 C) 98.3 F (36.8 C)  TempSrc: Oral Oral Oral Oral  SpO2: (!) 87% 97% 100% 96%    General: Pt is alert, awake, not in acute distress Cardiovascular: RRR, S1/S2 +, no rubs, no gallops Respiratory: CTA bilaterally, no wheezing, no rhonchi Abdominal: Soft, NT, ND, bowel sounds +, Foley intact without erythema or exudate, draining clear yellow urine Extremities: no edema, no cyanosis    The results of significant diagnostics from this hospitalization (including imaging, microbiology, ancillary and laboratory) are listed below for reference.     Microbiology: Recent Results (from the past 240 hour(s))  Urine Culture     Status: Abnormal   Collection Time: 01/08/23  3:09 PM   Specimen: Urine, Catheterized  Result Value Ref Range Status   Specimen Description   Final    URINE, CATHETERIZED Performed at [facility], [address], [place], [place] [address]    Special Requests   Final    NONE Performed at [facility], [address] [place], [place] [address]    Culture >=100,000 COLONIES/mL SERRATIA MARCESCENS (A)  Final   Report Status 01/10/2023 FINAL  Final   Organism ID, Bacteria SERRATIA MARCESCENS (A)  Final      Susceptibility   Serratia marcescens - MIC*    CEFEPIME <=0.12 SENSITIVE Sensitive     CEFTRIAXONE <=0.25 SENSITIVE Sensitive     CIPROFLOXACIN <=0.25 SENSITIVE Sensitive     GENTAMICIN <=1  SENSITIVE Sensitive     NITROFURANTOIN 256 RESISTANT Resistant     TRIMETH/SULFA <=20 SENSITIVE Sensitive     * >=100,000 COLONIES/mL SERRATIA MARCESCENS     Labs: BNP (last 3 results) No results for input(s): "BNP" in the last 8760 hours. Basic Metabolic Panel: Recent Labs  Lab 01/12/23 0053 01/13/23 0430 01/14/23 0400 01/15/23 0140 01/16/23 0458  NA 137 139 137 137 138  K 3.5 4.0 4.0 4.2 4.4  CL 106 107 108 107 108  CO2 22 22 23 22  21*  GLUCOSE 112* 100* 101* 119* 96  BUN 57* 39* 28* 25* 23  CREATININE 2.69* 2.36* 1.87* 1.82* 1.78*  CALCIUM 7.7* 8.2* 7.9* 7.8* 8.1*  MG  --   --   --  1.8 1.8  PHOS  --   --   --  2.7 3.0   Liver Function Tests: Recent Labs  Lab 01/10/23 1142 01/11/23 1153  AST 20 28  ALT 11 16  ALKPHOS 72 58  BILITOT 0.4 0.5  PROT 5.3* 5.1*  ALBUMIN 2.8* 2.2*   Recent Labs  Lab 01/11/23 1153  LIPASE 39   No results for input(s): "AMMONIA" in the last 168 hours. CBC: Recent Labs  Lab 01/10/23 1142 01/11/23 1153 01/12/23 0053 01/13/23 0430 01/14/23 0400 01/15/23 0140 01/16/23 0458  WBC 11.6*   < > 7.4 6.0 5.6 7.0 8.7  NEUTROABS 10.2*  --   --   --   --   --   --   HGB 10.1*   < > 8.4* 9.3* 8.5* 8.3* 8.9*  HCT 30.9*   < > 26.7* 30.2* 27.0* 26.8* 29.6*  MCV 94   < > 98.5 98.4 95.7 95.4 100.7*  PLT 204   < > 173 175 191 232 276   < > = values in this interval not displayed.   Cardiac Enzymes: No results for input(s): "CKTOTAL", "CKMB", "CKMBINDEX", "TROPONINI" in the last 168 hours. BNP: Invalid input(s): "POCBNP" CBG: Recent Labs  Lab 01/12/23 0906 01/12/23 0941 01/12/23 1146 01/12/23 1725  GLUCAP 75 85 131* 107*   D-Dimer No results for input(s): "DDIMER" in the last 72 hours. Hgb A1c No results for input(s): "HGBA1C" in the last 72 hours. Lipid Profile No results for input(s): "CHOL", "HDL", "LDLCALC", "TRIG", "CHOLHDL", "LDLDIRECT" in the last 72 hours. Thyroid function studies No results for input(s): "TSH",  "T4TOTAL", "T3FREE", "THYROIDAB" in the last 72 hours.  Invalid input(s): "FREET3" Anemia work up No results for input(s): "VITAMINB12", "FOLATE", "FERRITIN", "TIBC", "IRON", "RETICCTPCT" in the last 72 hours. Urinalysis    Component Value Date/Time   COLORURINE YELLOW 01/11/2023 1423   APPEARANCEUR CLOUDY (A) 01/11/2023 1423   APPEARANCEUR Clear 10/25/2021 0956   LABSPEC 1.016 01/11/2023 1423   PHURINE 5.0 01/11/2023 1423   GLUCOSEU NEGATIVE 01/11/2023 1423   HGBUR LARGE (A) 01/11/2023 1423   BILIRUBINUR NEGATIVE 01/11/2023 1423   BILIRUBINUR Negative 10/25/2021 0956   KETONESUR NEGATIVE 01/11/2023 1423   PROTEINUR 100 (A) 01/11/2023 1423   UROBILINOGEN 0.2 09/04/2012 1334   NITRITE NEGATIVE 01/11/2023 1423   LEUKOCYTESUR MODERATE (A) 01/11/2023 1423   Sepsis Labs Recent Labs  Lab 01/13/23 0430 01/14/23 0400 01/15/23 0140 01/16/23 0458  WBC 6.0 5.6 7.0 8.7   Microbiology Recent Results (from the past 240 hour(s))  Urine Culture     Status: Abnormal   Collection Time: 01/08/23  3:09 PM   Specimen: Urine, Catheterized  Result Value Ref Range Status   Specimen Description   Final    URINE, CATHETERIZED Performed at [facility], [address] [place], [place] [address]    Special Requests   Final    NONE Performed at [facility], [address], [place], [place] [address]    Culture >=100,000 COLONIES/mL SERRATIA MARCESCENS (A)  Final   Report Status 01/10/2023 FINAL  Final   Organism ID, Bacteria SERRATIA MARCESCENS (A)  Final      Susceptibility   Serratia marcescens - MIC*    CEFEPIME <=0.12 SENSITIVE Sensitive     CEFTRIAXONE <=0.25 SENSITIVE Sensitive     CIPROFLOXACIN <=0.25 SENSITIVE Sensitive     GENTAMICIN <=1 SENSITIVE Sensitive     NITROFURANTOIN 256 RESISTANT Resistant     TRIMETH/SULFA <=20 SENSITIVE Sensitive     * >=100,000 COLONIES/mL SERRATIA MARCESCENS     Time coordinating discharge: Over 30 minutes  SIGNED:   Azucena Fallen,  DO [facility] 01/16/2023, 12:06 PM  Pager   If 7PM-7AM, please contact night-coverage [email or web address]

## 2023-01-16 NOTE — Consult Note (Signed)
   [facility] Inpatient Consult   01/16/2023  Charles Golden 29-Oct-1925 [id number]  Follow up:  0808 secure chat from PCP, [name], DO   This writer reviewed information regarding post hospital needs.  Spoke with inpatient [facility] RNCM, regarding concerns of family and PT evaluation of needs for post hospital care with daughter for follow up.    Plan:  Will update Community Care Coordinator of any changes in needs, if additional referral is needed for community team.  For additional questions please contact:  Charlesetta Shanks, RN BSN CCM [facility]  [phone] business mobile phone Toll free office [phone]  Fax number: [phone] [email or web address]

## 2023-01-16 NOTE — TOC Transition Note (Signed)
Transition of Care [facility]) - CM/SW Discharge Note   Patient Details  Name: Charles Golden MRN: [id number] Date of Birth: 06-Sep-1925  [facility]) CM/SW Contact:  Epifanio Lesches, RN Phone Number: 01/16/2023, 3:53 PM   Clinical Narrative:    Patient will DC to: home Anticipated DC date: 01/16/2023 Family notified:yes Transport by: car   Per MD patient ready for DC today. RN, patient, patient's family, and [facility] notified of DC.  Post hospital f/u noted on AVS. Daughter Lynden Ang to provide transportation to home. RNCM will sign off for now as intervention is no longer needed. Please consult [place] again if new needs arise.   Final next level of care: Home w Home Health Services Barriers to Discharge: No Barriers Identified   Patient Goals and CMS Choice   Choice offered to / list presented to : Patient, Adult Children  Discharge Placement                         Discharge Plan and Services Additional resources added to the After Visit Summary for     Discharge Planning Services: CM Consult                      HH Arranged: PT, OT HH Agency: [facility] Date [facility] Agency Contacted: 01/15/23 Time HH Agency Contacted: 1524 Representative spoke with at [facility] Agency: Tresa Endo  Social Determinants of Health (SDOH) Interventions SDOH Screenings   Food Insecurity: No Food Insecurity (12/24/2022)  Housing: Low Risk  (12/24/2022)  Transportation Needs: No Transportation Needs (12/24/2022)  Utilities: Not At Risk (12/17/2022)  Depression (PHQ2-9): Low Risk  (01/10/2023)  Financial Resource Strain: Low Risk  (02/09/2020)  Physical Activity: Insufficiently Active (05/19/2021)  Social Connections: Socially Integrated (06/09/2019)  Stress: Stress Concern Present (07/17/2021)  Tobacco Use: Medium Risk (01/11/2023)     Readmission Risk Interventions    12/18/2022   11:22 AM  Readmission Risk Prevention Plan  Transportation Screening Complete  PCP or  Specialist Appt within 3-5 Days Complete  Social Work Consult for Recovery Care Planning/Counseling Complete  Palliative Care Screening Not Applicable  Medication Review Oceanographer) Referral to Pharmacy

## 2023-01-17 ENCOUNTER — Encounter: Payer: Self-pay | Admitting: Family Medicine

## 2023-01-17 ENCOUNTER — Telehealth: Payer: Self-pay

## 2023-01-17 DIAGNOSIS — R3914 Feeling of incomplete bladder emptying: Secondary | ICD-10-CM | POA: Diagnosis not present

## 2023-01-17 NOTE — Transitions of Care (Post Inpatient/ED Visit) (Signed)
01/17/2023  Name: Charles Golden MRN: [id number] DOB: 12-Jun-1925  Today's TOC FU Call Status: Today's TOC FU Call Status:: Successful TOC FU Call Completed TOC FU Call Complete Date: 01/17/23  Transition Care Management Follow-up Telephone Call Date of Discharge: 01/16/23 Discharge Facility: [facility]) Type of Discharge: Inpatient Admission Primary Inpatient Discharge Diagnosis:: Acute Kidney Injury Superimposed on Chronic Kidney Disease How have you been since you were released from the hospital?: Better Any questions or concerns?: Yes Patient Questions/Concerns:: Patient is concerned he passed a black stool. Patient Questions/Concerns Addressed: Other: (Patients daughter sent message to PCP, pending response)  Items Reviewed: Did you receive and understand the discharge instructions provided?: Yes Medications obtained,verified, and reconciled?: Partial Review Completed Reason for Partial Mediation Review: Discussed discharge information Any new allergies since your discharge?: No Dietary orders reviewed?: No Do you have support at home?: Yes People in Home: child(ren), adult Name of Support/Comfort Primary Source: [name]  Medications Reviewed Today: Medications Reviewed Today     Reviewed by Jodelle Gross, RN (Case Manager) on 01/17/23 at 1112  Med List Status: <None>   Medication Order Taking? Sig Documenting Provider Last Dose Status Informant  acetaminophen (TYLENOL) 500 MG tablet [id number]  Take 1,000 mg by mouth daily as needed for headache or moderate pain. [provider]  Active Family Member           Med Note Kandis Cocking Alinda Dooms A   Sat Jan 12, 2023  5:51 PM)    apixaban (ELIQUIS) 2.5 MG TABS tablet [id number]  Take 1 tablet (2.5 mg total) by mouth 2 (two) times daily. Delynn Flavin M, DO  Active Family Member  atorvastatin (LIPITOR) 40 MG tablet [id number]  Take 1 tablet (40 mg total) by mouth daily. Delynn Flavin M, DO  Active Family  Member  azelastine (ASTELIN) 0.1 % nasal spray [id number]  PLACE 1 SPRAY IN EACH NOSTRIL ONCE A DAY AS DIRECTED  Patient taking differently: Place 1 spray into both nostrils at bedtime.   Delynn Flavin M, DO  Active Family Member  cephALEXin (KEFLEX) 500 MG capsule [id number] Yes Take 1 capsule (500 mg total) by mouth 4 (four) times daily for 7 days. Azucena Fallen, MD Taking Active   cholecalciferol (CHOLECALCIFEROL) 25 MCG tablet [id number]  Take 1 tablet (1,000 Units total) by mouth every evening. Azucena Fallen, MD  Active Family Member  CRANBERRY PO [id number]  Take 1 tablet by mouth every evening. [provider]  Active Family Member  Cyanocobalamin (VITAMIN B-12 PO) [id number]  Take 1 tablet by mouth every evening. [provider]  Active Family Member  docusate sodium (COLACE) 100 MG capsule [id number]  Take 1 capsule (100 mg total) by mouth every evening. Azucena Fallen, MD  Active Family Member  ferrous sulfate 325 (65 FE) MG EC tablet [id number]  Take 325 mg by mouth daily. [provider]  Active Family Member  finasteride (PROSCAR) 5 MG tablet [id number]  Take 5 mg by mouth daily. [provider]  Active Family Member  fluticasone (FLONASE) 50 MCG/ACT nasal spray [id number]  Place 2 sprays into both nostrils daily.  Patient taking differently: Place 2 sprays into both nostrils at bedtime.   [name], DO  Active Family Member  Fluticasone-Umeclidin-Vilant (TRELEGY ELLIPTA) 100-62.5-25 MCG/ACT AEPB [id number]  Inhale 1 puff into the lungs every evening.  Patient taking differently: Inhale 1 puff into the lungs daily.   Coralyn Helling, MD  Active Family Member  gabapentin (NEURONTIN) 300  MG capsule [id number]  Take 300 mg by mouth at bedtime. [provider]  Active Family Member  latanoprost (XALATAN) 0.005 % ophthalmic solution [id number]  Place 1 drop into both eyes at bedtime. Azucena Fallen, MD  Active Family  Member  lubiprostone [facility]) 24 MCG capsule [id number]  Take 1 capsule (24 mcg total) by mouth 2 (two) times daily with a meal. For constipation Delynn Flavin M, DO  Active Family Member  mirtazapine (REMERON) 7.5 MG tablet [id number]  Take 1 tablet (7.5 mg total) by mouth at bedtime. [name], DO  Active Family Member  polycarbophil (FIBERCON) 625 MG tablet [id number] Yes Take 1 tablet (625 mg total) by mouth daily. Azucena Fallen, MD Taking Active   senna (SENOKOT) 8.6 MG TABS tablet [id number] Yes Take 1 tablet (8.6 mg total) by mouth 2 (two) times daily. Azucena Fallen, MD Taking Active   silodosin (RAPAFLO) 8 MG CAPS capsule [id number]  Take 8 mg by mouth daily. [provider]  Active Family Member  sodium zirconium cyclosilicate (LOKELMA) 10 g PACK packet [id number]  Take 10 g by mouth 2 (two) times daily. Vanetta Mulders, MD  Active Family Member  timolol (TIMOPTIC) 0.5 % ophthalmic solution [id number]  Place 1 drop into the left eye every morning. [provider]  Active Family Member  VENTOLIN HFA 108 [phone] Base) MCG/ACT inhaler [id number]  INHALE 2 PUFFS EVERY 6 HOURS AS NEEDED FOR WHEEZING OR SHORTNESS OF BREATH [name], DO  Active Family Member            Home Care and Equipment/Supplies: Were Home Health Services Ordered?: Yes Name of Home Health Agency:: [facility] Has Agency set up a time to come to your home?: No EMR reviewed for Home Health Orders:  (Patients daughter waiting to hear from [facility]. Verified they have orders) Any new equipment or medical supplies ordered?: No  Functional Questionnaire: Do you need assistance with bathing/showering or dressing?: No Do you need assistance with meal preparation?: No Do you need assistance with eating?: No Do you have difficulty maintaining continence: No Do you need assistance with getting out of bed/getting out of a chair/moving?: No Do you have difficulty managing or  taking your medications?: No  Follow up appointments reviewed: PCP Follow-up appointment confirmed?: Yes Date of PCP follow-up appointment?: 01/25/23 Follow-up Provider: Dr. Nadine Counts Specialist [facility] Follow-up appointment confirmed?: No Do you need transportation to your follow-up appointment?: No Do you understand care options if your condition(s) worsen?: Yes-patient verbalized understanding  SDOH Interventions Today    Flowsheet Row Most Recent Value  SDOH Interventions   Food Insecurity Interventions Intervention Not Indicated  Housing Interventions Intervention Not Indicated  Transportation Interventions Intervention Not Indicated      Interventions Today    Flowsheet Row Most Recent Value  General Interventions   General Interventions Discussed/Reviewed General Interventions Discussed, General Interventions Reviewed, Level of Care  Level of Care Personal Care Services  [Daughter is looking into hiring a caregiver for her Dad.  Adviced of our services.]     Jodelle Gross RN, BSN, CCM [facility] RN Care Coordinator/ Transitions of Care Direct Dial: [phone]  Fax: [phone]

## 2023-01-17 NOTE — Transitions of Care (Post Inpatient/ED Visit) (Signed)
   01/17/2023  Name: KENNAN MCDOLE MRN: [id number] DOB: [birth year]  Today's TOC FU Call Status: Today's TOC FU Call Status:: Unsuccessful Call (1st Attempt) Unsuccessful Call (1st Attempt) Date: 01/17/23  Attempted to reach the patient regarding the most recent Inpatient/ED visit.  Follow Up Plan: Additional outreach attempts will be made to reach the patient to complete the Transitions of Care (Post Inpatient/ED visit) call.   Jodelle Gross RN, BSN, CCM [facility] RN Care Coordinator/ Transitions of Care Direct Dial: [phone]  Fax: [phone]

## 2023-01-18 ENCOUNTER — Ambulatory Visit: Payer: Self-pay | Admitting: *Deleted

## 2023-01-18 ENCOUNTER — Telehealth: Payer: Self-pay | Admitting: Family Medicine

## 2023-01-18 ENCOUNTER — Other Ambulatory Visit: Payer: Self-pay | Admitting: Family Medicine

## 2023-01-18 DIAGNOSIS — N1832 Chronic kidney disease, stage 3b: Secondary | ICD-10-CM | POA: Diagnosis not present

## 2023-01-18 DIAGNOSIS — J479 Bronchiectasis, uncomplicated: Secondary | ICD-10-CM | POA: Diagnosis not present

## 2023-01-18 DIAGNOSIS — K219 Gastro-esophageal reflux disease without esophagitis: Secondary | ICD-10-CM | POA: Diagnosis not present

## 2023-01-18 DIAGNOSIS — I4891 Unspecified atrial fibrillation: Secondary | ICD-10-CM | POA: Diagnosis not present

## 2023-01-18 DIAGNOSIS — N179 Acute kidney failure, unspecified: Secondary | ICD-10-CM | POA: Diagnosis not present

## 2023-01-18 DIAGNOSIS — J449 Chronic obstructive pulmonary disease, unspecified: Secondary | ICD-10-CM | POA: Diagnosis not present

## 2023-01-18 DIAGNOSIS — K921 Melena: Secondary | ICD-10-CM

## 2023-01-18 NOTE — Patient Outreach (Signed)
  Care Coordination   Follow Up Visit Note   08/07/2023 late entry for 01/18/23 Name: Charles Golden MRN: [id number] DOB: 05/14/1926  Charles Golden is a 89 y.o. year old male who sees [name], DO for primary care. I spoke with  [name] by phone today.  What matters to the patients health and wellness today?  Shorter tubing for foley [facility] pharmacy confirmed not having one Ambulation using walker decrease strength Able to complete the rest of his home care independently  To get stool sample today related to black stool  Appetite not good but trying + Ensure Foley catheter  Goals Addressed             This Visit's Progress    have better mobility, bowel & bladder elimination, & appetite - care coordination       Interventions Today    Flowsheet Row Most Recent Value  Chronic Disease   Chronic disease during today's visit Other  [foley, ambulation, black stool, appetite]  General Interventions   General Interventions Discussed/Reviewed General Interventions Reviewed, Durable Medical Equipment (DME), Doctor Visits  Doctor Visits Discussed/Reviewed Doctor Visits Reviewed, PCP  Durable Medical Equipment (DME) Other  Lurline Idol tubing]  PCP/Specialist Visits Compliance with follow-up visit  Education Interventions   Education Provided Provided Education  Provided Verbal Education On Nutrition  Nutrition Interventions   Nutrition Discussed/Reviewed Nutrition Reviewed, Supplemental nutrition  [takes ensure]              SDOH assessments and interventions completed:  No     Care Coordination Interventions:  Yes, provided   Follow up plan: Follow up call scheduled for 02/01/23    Encounter Outcome:  Pt. Visit Completed   Saher Davee L. Noelle Penner, RN, BSN, CCM [facility]   RN Care coordinator Direct Dial: [phone]  Fax: [phone]

## 2023-01-18 NOTE — Telephone Encounter (Signed)
Please make sure his daughter knows this.

## 2023-01-21 ENCOUNTER — Other Ambulatory Visit: Payer: Medicare Other

## 2023-01-21 ENCOUNTER — Telehealth: Payer: Self-pay | Admitting: *Deleted

## 2023-01-21 DIAGNOSIS — K921 Melena: Secondary | ICD-10-CM | POA: Diagnosis not present

## 2023-01-21 NOTE — Progress Notes (Unsigned)
  Care Coordination  Outreach Note  01/21/2023 Name: DARTANYAN MANOLIS MRN: [id number] DOB: 01/27/1926   Care Coordination Outreach Attempts: An unsuccessful telephone outreach was attempted today to offer the patient information about available care coordination services.  Follow Up Plan:  Additional outreach attempts will be made to offer the patient care coordination information and services.   Encounter Outcome:  No Answer  Gwenevere Ghazi  Care Coordination Care Guide  Direct Dial: [phone]

## 2023-01-22 DIAGNOSIS — K5904 Chronic idiopathic constipation: Secondary | ICD-10-CM | POA: Diagnosis not present

## 2023-01-22 DIAGNOSIS — R634 Abnormal weight loss: Secondary | ICD-10-CM | POA: Diagnosis not present

## 2023-01-22 DIAGNOSIS — R627 Adult failure to thrive: Secondary | ICD-10-CM | POA: Diagnosis not present

## 2023-01-22 DIAGNOSIS — R531 Weakness: Secondary | ICD-10-CM | POA: Diagnosis not present

## 2023-01-22 DIAGNOSIS — R110 Nausea: Secondary | ICD-10-CM | POA: Diagnosis not present

## 2023-01-22 DIAGNOSIS — D649 Anemia, unspecified: Secondary | ICD-10-CM | POA: Diagnosis not present

## 2023-01-22 LAB — FECAL OCCULT BLOOD, IMMUNOCHEMICAL: Fecal Occult Bld: NEGATIVE

## 2023-01-22 NOTE — Progress Notes (Unsigned)
  Care Coordination  Outreach Note  01/22/2023 Name: Charles Golden MRN: [id number] DOB: August 08, 1925   Care Coordination Outreach Attempts: A second unsuccessful outreach was attempted today to offer the patient with information about available care coordination services.  Follow Up Plan:  Additional outreach attempts will be made to offer the patient care coordination information and services.   Encounter Outcome:  No Answer  Gwenevere Ghazi  Care Coordination Care Guide  Direct Dial: [phone]

## 2023-01-23 DIAGNOSIS — R338 Other retention of urine: Secondary | ICD-10-CM | POA: Diagnosis not present

## 2023-01-23 DIAGNOSIS — N401 Enlarged prostate with lower urinary tract symptoms: Secondary | ICD-10-CM | POA: Diagnosis not present

## 2023-01-24 NOTE — Progress Notes (Addendum)
  Care Coordination Note  01/24/2023 Name: Charles Golden MRN: [id number] DOB: 13-Apr-1926  Charles Golden is a 89 y.o. year old male who is a primary care patient of [name], DO and is actively engaged with the care management team. I reached out to [name] by phone today to assist with scheduling an initial visit with the BSW  Follow up plan: Patient declines engagement with BSW by the care management team. Appropriate care team members and provider have been notified via electronic communication.   [facility]  Care Coordination Care Guide  Direct Dial: [phone]

## 2023-01-24 NOTE — Progress Notes (Signed)
  Care Coordination  Outreach Note  01/24/2023 Name: Charles Golden MRN: [id number] DOB: 05/28/1926   Care Coordination Outreach Attempts: A third unsuccessful outreach was attempted today to offer the patient with information about available care coordination services.  Follow Up Plan:  No further outreach attempts will be made at this time. We have been unable to contact the patient to offer or enroll patient in care coordination services  Encounter Outcome:  No Answer   Gwenevere Ghazi  Care Coordination Care Guide  Direct Dial: [phone]

## 2023-01-25 ENCOUNTER — Encounter: Payer: Self-pay | Admitting: Family Medicine

## 2023-01-25 ENCOUNTER — Ambulatory Visit (INDEPENDENT_AMBULATORY_CARE_PROVIDER_SITE_OTHER): Payer: Medicare Other | Admitting: Family Medicine

## 2023-01-25 VITALS — BP 100/66 | HR 66 | Temp 97.4°F | Resp 20 | Ht 70.0 in | Wt 144.0 lb

## 2023-01-25 DIAGNOSIS — Z09 Encounter for follow-up examination after completed treatment for conditions other than malignant neoplasm: Secondary | ICD-10-CM

## 2023-01-25 DIAGNOSIS — N189 Chronic kidney disease, unspecified: Secondary | ICD-10-CM

## 2023-01-25 DIAGNOSIS — N179 Acute kidney failure, unspecified: Secondary | ICD-10-CM

## 2023-01-25 DIAGNOSIS — N390 Urinary tract infection, site not specified: Secondary | ICD-10-CM | POA: Diagnosis not present

## 2023-01-25 LAB — CBC WITH DIFFERENTIAL/PLATELET
Basophils Absolute: 0 10*3/uL (ref 0.0–0.2)
Basos: 0 %
EOS (ABSOLUTE): 0.2 10*3/uL (ref 0.0–0.4)
Eos: 3 %
Hematocrit: 31.7 % — ABNORMAL LOW (ref 37.5–51.0)
Hemoglobin: 10.3 g/dL — ABNORMAL LOW (ref 13.0–17.7)
Lymphocytes Absolute: 1.8 10*3/uL (ref 0.7–3.1)
Lymphs: 25 %
MCH: 30.2 pg (ref 26.6–33.0)
MCHC: 32.5 g/dL (ref 31.5–35.7)
MCV: 93 fL (ref 79–97)
Monocytes Absolute: 0.8 10*3/uL (ref 0.1–0.9)
Monocytes: 11 %
Neutrophils Absolute: 4.5 10*3/uL (ref 1.4–7.0)
Neutrophils: 61 %
Platelets: 510 10*3/uL — ABNORMAL HIGH (ref 150–450)
RBC: 3.41 x10E6/uL — ABNORMAL LOW (ref 4.14–5.80)
RDW: 14.6 % (ref 11.6–15.4)
WBC: 7.3 10*3/uL (ref 3.4–10.8)

## 2023-01-25 LAB — RENAL FUNCTION PANEL
Albumin: 3.5 g/dL — ABNORMAL LOW (ref 3.6–4.6)
BUN/Creatinine Ratio: 11 (ref 10–24)
BUN: 21 mg/dL (ref 10–36)
CO2: 23 mmol/L (ref 20–29)
Calcium: 9.6 mg/dL (ref 8.6–10.2)
Chloride: 107 mmol/L — ABNORMAL HIGH (ref 96–106)
Creatinine, Ser: 1.93 mg/dL — ABNORMAL HIGH (ref 0.76–1.27)
Glucose: 98 mg/dL (ref 70–99)
Phosphorus: 3.3 mg/dL (ref 2.8–4.1)
Potassium: 5.3 mmol/L — ABNORMAL HIGH (ref 3.5–5.2)
Sodium: 140 mmol/L (ref 134–144)
eGFR: 31 mL/min/{1.73_m2} — ABNORMAL LOW (ref >59)

## 2023-01-25 NOTE — Progress Notes (Signed)
Subjective: CC: Hospital follow-up PCP: [name], DO Charles Golden is a 89 y.o. male presenting to clinic today for:  1.  Hospital discharge follow-up for acute on chronic renal failure Patient is accompanied today's visit by his daughter.  He notes that he has been doing relatively well from a urinary out put standpoint.  He is still catheterize and will plan to get these renewed every month.  He has seen his urologist since discharge and there are reports that maybe they will proceed with some type of surgical intervention for his prostate.  Though not quite sure when or what will be done.  At this point he is not overly concerned about having a catheter and he is more concerned about his generalized weakness, decreased appetite etc.  He reports poor energy.  He was evaluated by physical therapy who did not feel that he needed any outpatient assistance including home health physical therapy.  He admits that he does not drink a bunch of water and often will get some positional dizziness.  No falls reported.  No lower extremity edema.   ROS: Per HPI  Allergies  Allergen Reactions   Demerol [Meperidine] Other (See Comments)    Pt. States "he woke up during a colonoscopy"   Spiriva Handihaler [Tiotropium Bromide Monohydrate] Other (See Comments)    Mild Urinary Retention   Past Medical History:  Diagnosis Date   Acute medial meniscal tear 07/20/2014   Atrial fibrillation with RVR (HCC) 05/10/2021   Chronic headache 08/05/2013   COPD (chronic obstructive pulmonary disease) (HCC)    CVA (cerebral vascular accident) (HCC) 05/01/2021   Enlarged prostate    GERD (gastroesophageal reflux disease)    Glaucoma    Glucagonoma    Hernia, incisional    at present   [facility] (hard of hearing)    IBS (irritable bowel syndrome)    Incisional hernia, without obstruction or gangrene 08/05/2013   Sciatic pain    Stage 3b chronic kidney disease (CKD) (HCC)     Current Outpatient  Medications:    acetaminophen (TYLENOL) 500 MG tablet, Take 1,000 mg by mouth daily as needed for headache or moderate pain., Disp: , Rfl:    apixaban (ELIQUIS) 2.5 MG TABS tablet, Take 1 tablet (2.5 mg total) by mouth 2 (two) times daily., Disp: 180 tablet, Rfl: 3   atorvastatin (LIPITOR) 40 MG tablet, Take 1 tablet (40 mg total) by mouth daily., Disp: 90 tablet, Rfl: 3   azelastine (ASTELIN) 0.1 % nasal spray, PLACE 1 SPRAY IN EACH NOSTRIL ONCE A DAY AS DIRECTED (Patient taking differently: Place 1 spray into both nostrils at bedtime.), Disp: 30 mL, Rfl: 5   cholecalciferol (CHOLECALCIFEROL) 25 MCG tablet, Take 1 tablet (1,000 Units total) by mouth every evening., Disp: 30 tablet, Rfl: 0   CRANBERRY PO, Take 1 tablet by mouth every evening., Disp: , Rfl:    Cyanocobalamin (VITAMIN B-12 PO), Take 1 tablet by mouth every evening., Disp: , Rfl:    docusate sodium (COLACE) 100 MG capsule, Take 1 capsule (100 mg total) by mouth every evening., Disp: 30 capsule, Rfl: 0   ferrous sulfate 325 (65 FE) MG EC tablet, Take 325 mg by mouth daily., Disp: , Rfl:    finasteride (PROSCAR) 5 MG tablet, Take 5 mg by mouth daily., Disp: , Rfl:    fluticasone (FLONASE) 50 MCG/ACT nasal spray, Place 2 sprays into both nostrils daily. (Patient taking differently: Place 2 sprays into both nostrils at bedtime.), Disp:  16 g, Rfl: 11   Fluticasone-Umeclidin-Vilant (TRELEGY ELLIPTA) 100-62.5-25 MCG/ACT AEPB, Inhale 1 puff into the lungs every evening. (Patient taking differently: Inhale 1 puff into the lungs daily.), Disp: 60 each, Rfl: 5   gabapentin (NEURONTIN) 300 MG capsule, Take 300 mg by mouth at bedtime., Disp: , Rfl:    latanoprost (XALATAN) 0.005 % ophthalmic solution, Place 1 drop into both eyes at bedtime., Disp: 2.5 mL, Rfl: 0   lubiprostone (AMITIZA) 24 MCG capsule, Take 1 capsule (24 mcg total) by mouth 2 (two) times daily with a meal. For constipation, Disp: 180 capsule, Rfl: 3   mirtazapine (REMERON) 7.5 MG  tablet, Take 1 tablet (7.5 mg total) by mouth at bedtime., Disp: 90 tablet, Rfl: 3   polycarbophil (FIBERCON) 625 MG tablet, Take 1 tablet (625 mg total) by mouth daily., Disp: 30 tablet, Rfl: 0   senna (SENOKOT) 8.6 MG TABS tablet, Take 1 tablet (8.6 mg total) by mouth 2 (two) times daily., Disp: 120 tablet, Rfl: 0   silodosin (RAPAFLO) 8 MG CAPS capsule, Take 8 mg by mouth daily., Disp: , Rfl:    sodium zirconium cyclosilicate (LOKELMA) 10 g PACK packet, Take 10 g by mouth 2 (two) times daily., Disp: 2 packet, Rfl: 0   timolol (TIMOPTIC) 0.5 % ophthalmic solution, Place 1 drop into the left eye every morning., Disp: , Rfl:    VENTOLIN HFA 108 (90 Base) MCG/ACT inhaler, INHALE 2 PUFFS EVERY 6 HOURS AS NEEDED FOR WHEEZING OR SHORTNESS OF BREATH, Disp: 18 g, Rfl: 1 Social History   Socioeconomic History   Marital status: Married    Spouse name: [name]   Number of children: 3   Years of education: 12+   Highest education level: Bachelor's degree (e.g., BA, AB, BS)  Occupational History   Occupation: Retired    Comment: Programmer, systems  Tobacco Use   Smoking status: Former    Current packs/day: 0.00    Average packs/day: 1 pack/day for 30.0 years (30.0 ttl pk-yrs)    Types: Cigarettes    Start date: 05/28/1976    Quit date: 05/28/2006    Years since quitting: 16.6   Smokeless tobacco: Never  Vaping Use   Vaping status: Never Used  Substance and Sexual Activity   Alcohol use: Not Currently    Alcohol/week: 0.0 standard drinks of alcohol   Drug use: No   Sexual activity: Yes    Birth control/protection: None  Other Topics Concern   Not on file  Social History Narrative   Patient lives at home with [name].    Patient has 3 children.    Patient has his BS   Patient is retired.    Drinks about 2 cups of coffee per day.      [facility]:   He is still married. He previously worked as a Programmer, systems. No significant dust exposure. He mostly worked with synthetic  fibers. He is from [place].       Social Determinants of Health   Financial Resource Strain: Low Risk  (02/09/2020)   Overall Financial Resource Strain (CARDIA)    Difficulty of Paying Living Expenses: Not hard at all  Food Insecurity: No Food Insecurity (01/17/2023)   Hunger Vital Sign    Worried About Running Out of Food in the Last Year: Never true    Ran Out of Food in the Last Year: Never true  Transportation Needs: No Transportation Needs (01/17/2023)   PRAPARE - Transportation    Lack  of Transportation (Medical): No    Lack of Transportation (Non-Medical): No  Physical Activity: Insufficiently Active (05/19/2021)   Exercise Vital Sign    Days of Exercise per Week: 1 day    Minutes of Exercise per Session: 10 min  Stress: Stress Concern Present (07/17/2021)   [employer] of Occupational Health - Occupational Stress Questionnaire    Feeling of Stress : To some extent  Social Connections: Socially Integrated (06/09/2019)   Social Connection and Isolation Panel [NHANES]    Frequency of Communication with Friends and Family: More than three times a week    Frequency of Social Gatherings with Friends and Family: More than three times a week    Attends Religious Services: More than 4 times per year    Active Member of [employer] or Organizations: Yes    Attends Engineer, structural: More than 4 times per year    Marital Status: Married  Catering manager Violence: Not At Risk (12/17/2022)   Humiliation, Afraid, Rape, and Kick questionnaire    Fear of Current or Ex-Partner: No    Emotionally Abused: No    Physically Abused: No    Sexually Abused: No   Family History  Problem Relation Age of Onset   Heart disease Mother        Valve replacement and pacemaker   Colon cancer Father    Asthma Other        cousin   Healthy Daughter    Healthy Daughter    Healthy Daughter     Objective: Office vital signs reviewed. BP 100/66   Pulse 66   Temp (!) 97.4 F (36.3 C)  (Temporal)   Resp 20   Ht 5\' 10"  (1.778 m)   Wt 144 lb (65.3 kg)   SpO2 99%   BMI 20.66 kg/m   Physical Examination:  General: Awake, alert, thin, chronically appearing male, No acute distress HEENT: sclera white Cardio: regular rate and rhythm, S1S2 heard, no murmurs appreciated Pulm: clear to auscultation bilaterally, no wheezes, rhonchi or rales; normal work of breathing on room air MSK: ambulating with use of cane.  Assessment/ Plan: 89 y.o. male   Acute kidney injury superimposed on chronic kidney disease (HCC) - Plan: CBC, CBC with Differential/Platelet, Renal Function Panel, CANCELED: Renal Function Panel  Hospital discharge follow-up  Complicated urinary tract infection - Plan: CBC, CANCELED: Renal Function Panel  Stat CBC and renal function panel ordered today.  Follow-up on hemoglobin which has been on the low side.  He had iron studies done which showed a low iron at his gastroenterologist office.  He is potentially going to be treated with an iron infusion but his daughter reports not having heard back about that yet.  Hemoglobin was 10.4.  This is up from 8.9 at discharge.  No orders of the defined types were placed in this encounter.  No orders of the defined types were placed in this encounter.   Today's visit is for Transitional Care Management.  The patient was discharged from [facility] on 01/16/2023 with a primary diagnosis of AKI on CKD.   Contact with the patient and/or caregiver, by a clinical staff member, was made on 01/17/23 and was documented as a telephone encounter within the EMR.  Through chart review and discussion with the patient I have determined that management of their condition is of moderate complexity.    [name], DO [facility] [phone]

## 2023-01-29 ENCOUNTER — Other Ambulatory Visit: Payer: Self-pay | Admitting: Family Medicine

## 2023-01-29 DIAGNOSIS — Z8673 Personal history of transient ischemic attack (TIA), and cerebral infarction without residual deficits: Secondary | ICD-10-CM

## 2023-01-30 ENCOUNTER — Ambulatory Visit: Payer: Medicare Other | Admitting: Family Medicine

## 2023-02-01 ENCOUNTER — Ambulatory Visit: Payer: Self-pay | Admitting: *Deleted

## 2023-02-01 NOTE — Patient Outreach (Signed)
  Care Coordination   02/01/2023 Name: Charles Golden MRN: [id number] DOB: [birth year]   Care Coordination Outreach Attempts:  An unsuccessful telephone outreach was attempted for a scheduled appointment today.  Follow Up Plan:  Additional outreach attempts will be made to offer the patient care coordination information and services.   Encounter Outcome:  No Answer   Care Coordination Interventions:  No, not indicated      Maleeha Halls L. Noelle Penner, RN, BSN, CCM, Care Management Coordinator [phone]

## 2023-02-05 DIAGNOSIS — K5909 Other constipation: Secondary | ICD-10-CM | POA: Diagnosis not present

## 2023-02-05 DIAGNOSIS — D508 Other iron deficiency anemias: Secondary | ICD-10-CM | POA: Insufficient documentation

## 2023-02-05 DIAGNOSIS — R5381 Other malaise: Secondary | ICD-10-CM | POA: Diagnosis not present

## 2023-02-13 ENCOUNTER — Other Ambulatory Visit: Payer: Self-pay

## 2023-02-13 ENCOUNTER — Other Ambulatory Visit: Payer: Self-pay | Admitting: Family Medicine

## 2023-02-13 ENCOUNTER — Other Ambulatory Visit (HOSPITAL_BASED_OUTPATIENT_CLINIC_OR_DEPARTMENT_OTHER): Payer: Self-pay

## 2023-02-13 DIAGNOSIS — I4811 Longstanding persistent atrial fibrillation: Secondary | ICD-10-CM

## 2023-02-13 DIAGNOSIS — R338 Other retention of urine: Secondary | ICD-10-CM | POA: Diagnosis not present

## 2023-02-13 MED ORDER — APIXABAN 2.5 MG PO TABS
2.5000 mg | ORAL_TABLET | Freq: Two times a day (BID) | ORAL | 0 refills | Status: DC
Start: 2023-02-13 — End: 2023-05-14
  Filled 2023-02-13: qty 180, 90d supply, fill #0

## 2023-02-13 MED FILL — Fluticasone-Umeclidinium-Vilanterol AEPB 100-62.5-25 MCG/ACT: RESPIRATORY_TRACT | 30 days supply | Qty: 60 | Fill #2 | Status: AC

## 2023-02-14 ENCOUNTER — Other Ambulatory Visit (HOSPITAL_BASED_OUTPATIENT_CLINIC_OR_DEPARTMENT_OTHER): Payer: Self-pay

## 2023-02-14 DIAGNOSIS — D508 Other iron deficiency anemias: Secondary | ICD-10-CM | POA: Diagnosis not present

## 2023-02-20 ENCOUNTER — Encounter: Payer: Self-pay | Admitting: Family Medicine

## 2023-02-22 ENCOUNTER — Ambulatory Visit: Payer: Self-pay | Admitting: *Deleted

## 2023-02-22 DIAGNOSIS — M48061 Spinal stenosis, lumbar region without neurogenic claudication: Secondary | ICD-10-CM | POA: Insufficient documentation

## 2023-02-22 NOTE — Patient Outreach (Signed)
Care Coordination   Follow Up with case closure Visit Note   02/22/2023 Name: Charles Golden MRN: [id number] DOB: [birth year]  Charles Golden is a 89 y.o. year old male who sees Charles Ip, DO for primary care. I spoke with daughter Charles Golden of  Charles Golden by phone today.  What matters to the patients health and wellness today?  No energy but doing very well medically and socially per Daughter   Continue to get iv iron infusions for his iron deficiency anemia Last one on 02/14/23 and next one on 02/25/23.  His daughter and the patient continue to be very knowledgeable of his home care, outreaches to medical staff as needed, attends all medial appointments and is followed routinely by his nephrologist & urologist (patent indwelling catheter) and hematology/oncology providers Denies worsening symptoms for Anemia & Chronic Kidney disease (CKD) which has been the primary medical concerns for 2024 nor  Paroxysmal Atrial fibrillation (PAF)& congestive Heart Failure (CHF)  Confirms he is using mucinex once a day an increasing fluids as confirmed with consulting the pcp recently. Charles Golden   He remains social - attending a wedding this weekend  Daughter voiced appreciation for RN CM services and agrees to Case closure, the offer to outreach to RN CM as needed plus to have RN CM reach out to her or patient as needed    Goals Addressed               This Visit's Progress     Patient Stated     COMPLETED: Manage pain of knee/hip, hearing aid resource care coordination services (pt-stated)        Case closure Has hearing aid, remains active and compliant with home health care needs  Interventions Today    Flowsheet Row Most Recent Value  Chronic Disease   Chronic disease during today's visit Congestive Heart Failure (CHF), Atrial Fibrillation (AFib), Chronic Kidney Disease/End Stage Renal Disease (ESRD), Other  [iron defiency anemia]  General Interventions   General Interventions  Discussed/Reviewed General Interventions Reviewed, Doctor Visits  Doctor Visits Discussed/Reviewed Doctor Visits Reviewed, PCP, Specialist  PCP/Specialist Visits Compliance with follow-up visit  Exercise Interventions   Exercise Discussed/Reviewed Exercise Reviewed, Physical Activity, Assistive device use and maintanence  Physical Activity Discussed/Reviewed Physical Activity Discussed, Types of exercise  [keeps as active as possible at 95 walking with assistance when needed]  Education Interventions   Education Provided Provided Education  Provided Verbal Education On Nutrition  Mental Health Interventions   Mental Health Discussed/Reviewed Mental Health Reviewed, Coping Strategies  Nutrition Interventions   Nutrition Discussed/Reviewed Nutrition Reviewed, Fluid intake  [hydration encouraged]  Pharmacy Interventions   Pharmacy Dicussed/Reviewed Pharmacy Topics Reviewed, Affording Medications  Safety Interventions   Safety Discussed/Reviewed Safety Reviewed, Fall Risk  Advanced Directive Interventions   Advanced Directives Discussed/Reviewed Advanced Directives Discussed, Advanced Care Planning  [has documents]              SDOH assessments and interventions completed:  No     Care Coordination Interventions:  Yes, provided   Follow up plan: No further intervention required.   Encounter Outcome:  Patient Visit Completed   Cala Bradford L. Noelle Penner, RN, BSN, CCM, Care Management Coordinator [phone]

## 2023-02-22 NOTE — Patient Instructions (Signed)
Visit Information  Thank you for taking time to visit with me today. Please don't hesitate to contact me if I can be of assistance to you.   Following are the goals we discussed today:   Goals Addressed               This Visit's Progress     Patient Stated     COMPLETED: Manage pain of knee/hip, hearing aid resource care coordination services (pt-stated)        Case closure Has hearing aid, remains active and compliant with home health care needs  Interventions Today    Flowsheet Row Most Recent Value  Chronic Disease   Chronic disease during today's visit Congestive Heart Failure (CHF), Atrial Fibrillation (AFib), Chronic Kidney Disease/End Stage Renal Disease (ESRD), Other  [iron defiency anemia]  General Interventions   General Interventions Discussed/Reviewed General Interventions Reviewed, Doctor Visits  Doctor Visits Discussed/Reviewed Doctor Visits Reviewed, PCP, Specialist  PCP/Specialist Visits Compliance with follow-up visit  Exercise Interventions   Exercise Discussed/Reviewed Exercise Reviewed, Physical Activity, Assistive device use and maintanence  Physical Activity Discussed/Reviewed Physical Activity Discussed, Types of exercise  [keeps as active as possible at 95 walking with assistance when needed]  Education Interventions   Education Provided Provided Education  Provided Verbal Education On Nutrition  Mental Health Interventions   Mental Health Discussed/Reviewed Mental Health Reviewed, Coping Strategies  Nutrition Interventions   Nutrition Discussed/Reviewed Nutrition Reviewed, Fluid intake  [hydration encouraged]  Pharmacy Interventions   Pharmacy Dicussed/Reviewed Pharmacy Topics Reviewed, Affording Medications  Safety Interventions   Safety Discussed/Reviewed Safety Reviewed, Fall Risk  Advanced Directive Interventions   Advanced Directives Discussed/Reviewed Advanced Directives Discussed, Advanced Care Planning  [has documents]               Our next appointment is  n/a  on n/a at n/a  Please call the care guide team at [phone] if you need to cancel or reschedule your appointment.   If you are experiencing a Mental Health or Behavioral Health Crisis or need someone to talk to, please call the Suicide and Crisis Lifeline: 988 call the [place] National Suicide Prevention Lifeline: 701-008-1308 or TTY: 412-322-3655 TTY 712 860 5183) to talk to a trained counselor call 1-800-273-TALK (toll free, 24 hour hotline) call the [facility]: 747-858-0771 call 911   Patient verbalizes understanding of instructions and care plan provided today and agrees to view in MyChart. Active MyChart status and patient understanding of how to access instructions and care plan via MyChart confirmed with patient.     The patient has been provided with contact information for the care management team and has been advised to call with any health related questions or concerns.   Kalie Cabral L. Noelle Penner, RN, BSN, CCM, Care Management Coordinator [phone]

## 2023-02-25 NOTE — Patient Instructions (Addendum)
 Visit Information  Thank you for taking time to visit with me today. Please don't hesitate to contact me if I can be of assistance to you.   Following are the goals we discussed today:   Goals Addressed             This Visit's Progress    have better mobility, bowel & bladder elimination, & appetite - care coordination       Interventions Today    Flowsheet Row Most Recent Value  Chronic Disease   Chronic disease during today's visit Other  [foley, ambulation, black stool, appetite]  General Interventions   General Interventions Discussed/Reviewed General Interventions Reviewed, Durable Medical Equipment (DME), Doctor Visits  Doctor Visits Discussed/Reviewed Doctor Visits Reviewed, PCP  Durable Medical Equipment (DME) Other  [foley tubing]  PCP/Specialist Visits Compliance with follow-up visit  Education Interventions   Education Provided Provided Education  Provided Verbal Education On Nutrition  Nutrition Interventions   Nutrition Discussed/Reviewed Nutrition Reviewed, Supplemental nutrition  [takes ensure]              Our next appointment is by telephone on 02/01/23 at 1115  Please call the care guide team at [phone] if you need to cancel or reschedule your appointment.   If you are experiencing a Mental Health or Behavioral Health Crisis or need someone to talk to, please call the Suicide and Crisis Lifeline: 988 call the [place] National Suicide Prevention Lifeline: (407) 020-1935 or TTY: 228-776-6954 TTY 7432471408) to talk to a trained counselor call 1-800-273-TALK (toll free, 24 hour hotline) call the [facility]: 309-135-3332 call 911   Patient verbalizes understanding of instructions and care plan provided today and agrees to view in MyChart. Active MyChart status and patient understanding of how to access instructions and care plan via MyChart confirmed with patient.     The patient has been provided with contact information for the  care management team and has been advised to call with any health related questions or concerns.   Kamilya Wakeman L. Noelle Penner, RN, BSN, CCM, Care Management Coordinator [phone]

## 2023-02-27 DIAGNOSIS — D508 Other iron deficiency anemias: Secondary | ICD-10-CM | POA: Diagnosis not present

## 2023-03-04 DIAGNOSIS — R338 Other retention of urine: Secondary | ICD-10-CM | POA: Diagnosis not present

## 2023-03-11 ENCOUNTER — Telehealth: Payer: Self-pay | Admitting: Family Medicine

## 2023-03-11 DIAGNOSIS — R627 Adult failure to thrive: Secondary | ICD-10-CM | POA: Diagnosis not present

## 2023-03-11 DIAGNOSIS — D508 Other iron deficiency anemias: Secondary | ICD-10-CM | POA: Diagnosis not present

## 2023-03-11 DIAGNOSIS — R600 Localized edema: Secondary | ICD-10-CM | POA: Diagnosis not present

## 2023-03-11 DIAGNOSIS — K5909 Other constipation: Secondary | ICD-10-CM | POA: Diagnosis not present

## 2023-03-11 DIAGNOSIS — R5381 Other malaise: Secondary | ICD-10-CM | POA: Diagnosis not present

## 2023-03-12 DIAGNOSIS — H40051 Ocular hypertension, right eye: Secondary | ICD-10-CM | POA: Diagnosis not present

## 2023-03-12 DIAGNOSIS — Z961 Presence of intraocular lens: Secondary | ICD-10-CM | POA: Diagnosis not present

## 2023-03-12 DIAGNOSIS — H26492 Other secondary cataract, left eye: Secondary | ICD-10-CM | POA: Diagnosis not present

## 2023-03-12 DIAGNOSIS — H353121 Nonexudative age-related macular degeneration, left eye, early dry stage: Secondary | ICD-10-CM | POA: Diagnosis not present

## 2023-03-12 DIAGNOSIS — H353112 Nonexudative age-related macular degeneration, right eye, intermediate dry stage: Secondary | ICD-10-CM | POA: Diagnosis not present

## 2023-03-12 DIAGNOSIS — H401122 Primary open-angle glaucoma, left eye, moderate stage: Secondary | ICD-10-CM | POA: Diagnosis not present

## 2023-03-12 NOTE — Telephone Encounter (Signed)
Daughter, [name] aware.

## 2023-03-12 NOTE — Telephone Encounter (Signed)
I am not able to see any labs. Last draw I can find in the [facility] system is from 01/25/2023.  If they use anything but [facility] or [facility] for system I will not be able to see lab results unless they fax them to me.

## 2023-03-12 NOTE — Telephone Encounter (Signed)
Left vm for cb

## 2023-03-13 ENCOUNTER — Telehealth: Payer: Self-pay | Admitting: Family Medicine

## 2023-03-13 NOTE — Telephone Encounter (Signed)
Aware to call GI since they ordered labs.

## 2023-03-19 ENCOUNTER — Ambulatory Visit (INDEPENDENT_AMBULATORY_CARE_PROVIDER_SITE_OTHER): Payer: Medicare Other | Admitting: Family Medicine

## 2023-03-19 ENCOUNTER — Encounter: Payer: Self-pay | Admitting: Family Medicine

## 2023-03-19 VITALS — BP 111/62 | HR 68 | Temp 98.4°F | Ht 70.0 in | Wt 155.2 lb

## 2023-03-19 DIAGNOSIS — Z23 Encounter for immunization: Secondary | ICD-10-CM

## 2023-03-19 DIAGNOSIS — D508 Other iron deficiency anemias: Secondary | ICD-10-CM | POA: Diagnosis not present

## 2023-03-19 DIAGNOSIS — K5909 Other constipation: Secondary | ICD-10-CM

## 2023-03-19 DIAGNOSIS — N1832 Chronic kidney disease, stage 3b: Secondary | ICD-10-CM | POA: Diagnosis not present

## 2023-03-19 DIAGNOSIS — R5381 Other malaise: Secondary | ICD-10-CM | POA: Diagnosis not present

## 2023-03-19 NOTE — Progress Notes (Signed)
Subjective: CC: chronic follow up PCP: [name], DO Charles Golden is a 89 y.o. male presenting to clinic today for:  1.  Generalized weakness and physical deconditioning Patient is brought to the office by his daughter.  He continues to get iron infusions and will be seeing the specialist again in a couple of months.  He is expected to get labs for his renal specialist in the next 2 weeks.  He continues to have some generalized weakness.  His daughter reports that his appetite is improving and his bowel movements have been regular over the last week.  He is only taking the Amitiza once per day and this seems to be working well for him.  His biggest concern is that he just does not have good energy.  He becomes tearful because he thinks that sometimes this is just because he misses his wife.  He continues to use a walker for ambulation.   ROS: Per HPI  Allergies  Allergen Reactions   Demerol [Meperidine] Other (See Comments)    Pt. States "he woke up during a colonoscopy"   Spiriva Handihaler [Tiotropium Bromide Monohydrate] Other (See Comments)    Mild Urinary Retention   Past Medical History:  Diagnosis Date   Acute medial meniscal tear 07/20/2014   Atrial fibrillation with RVR (HCC) 05/10/2021   Chronic headache 08/05/2013   COPD (chronic obstructive pulmonary disease) (HCC)    CVA (cerebral vascular accident) (HCC) 05/01/2021   Enlarged prostate    GERD (gastroesophageal reflux disease)    Glaucoma    Glucagonoma    Hernia, incisional    at present   [facility] (hard of hearing)    IBS (irritable bowel syndrome)    Incisional hernia, without obstruction or gangrene 08/05/2013   Sciatic pain    Stage 3b chronic kidney disease (CKD) (HCC)     Current Outpatient Medications:    acetaminophen (TYLENOL) 500 MG tablet, Take 1,000 mg by mouth daily as needed for headache or moderate pain., Disp: , Rfl:    apixaban (ELIQUIS) 2.5 MG TABS tablet, Take 1 tablet (2.5 mg  total) by mouth 2 (two) times daily., Disp: 180 tablet, Rfl: 0   atorvastatin (LIPITOR) 40 MG tablet, TAKE ONE TABLET ONCE DAILY, Disp: 90 tablet, Rfl: 0   azelastine (ASTELIN) 0.1 % nasal spray, PLACE 1 SPRAY IN EACH NOSTRIL ONCE A DAY AS DIRECTED (Patient taking differently: Place 1 spray into both nostrils at bedtime.), Disp: 30 mL, Rfl: 5   cholecalciferol (CHOLECALCIFEROL) 25 MCG tablet, Take 1 tablet (1,000 Units total) by mouth every evening., Disp: 30 tablet, Rfl: 0   CRANBERRY PO, Take 1 tablet by mouth every evening., Disp: , Rfl:    Cyanocobalamin (VITAMIN B-12 PO), Take 1 tablet by mouth every evening., Disp: , Rfl:    ferrous sulfate 325 (65 FE) MG EC tablet, Take 325 mg by mouth daily., Disp: , Rfl:    finasteride (PROSCAR) 5 MG tablet, Take 5 mg by mouth daily., Disp: , Rfl:    fluticasone (FLONASE) 50 MCG/ACT nasal spray, USE 2 SPRAYS IN EACH NOSTRIL ONCE DAILY, Disp: 16 g, Rfl: 5   Fluticasone-Umeclidin-Vilant (TRELEGY ELLIPTA) 100-62.5-25 MCG/ACT AEPB, Inhale 1 puff into the lungs every evening. (Patient taking differently: Inhale 1 puff into the lungs daily.), Disp: 60 each, Rfl: 5   gabapentin (NEURONTIN) 300 MG capsule, Take 300 mg by mouth at bedtime., Disp: , Rfl:    latanoprost (XALATAN) 0.005 % ophthalmic solution, Place 1 drop into  both eyes at bedtime., Disp: 2.5 mL, Rfl: 0   lubiprostone (AMITIZA) 24 MCG capsule, Take 1 capsule (24 mcg total) by mouth 2 (two) times daily with a meal. For constipation, Disp: 180 capsule, Rfl: 3   mirtazapine (REMERON) 7.5 MG tablet, Take 1 tablet (7.5 mg total) by mouth at bedtime. (Patient not taking: Reported on 01/25/2023), Disp: 90 tablet, Rfl: 3   polycarbophil (FIBERCON) 625 MG tablet, Take 1 tablet (625 mg total) by mouth daily., Disp: 30 tablet, Rfl: 0   senna (SENOKOT) 8.6 MG TABS tablet, Take 1 tablet (8.6 mg total) by mouth 2 (two) times daily., Disp: 120 tablet, Rfl: 0   silodosin (RAPAFLO) 8 MG CAPS capsule, Take 8 mg by mouth  daily., Disp: , Rfl:    sodium zirconium cyclosilicate (LOKELMA) 10 g PACK packet, Take 10 g by mouth 2 (two) times daily., Disp: 2 packet, Rfl: 0   timolol (TIMOPTIC) 0.5 % ophthalmic solution, Place 1 drop into the left eye every morning., Disp: , Rfl:    VENTOLIN HFA 108 (90 Base) MCG/ACT inhaler, INHALE 2 PUFFS EVERY 6 HOURS AS NEEDED FOR WHEEZING OR SHORTNESS OF BREATH, Disp: 18 g, Rfl: 1 Social History   Socioeconomic History   Marital status: Married    Spouse name: [name]   Number of children: 3   Years of education: 12+   Highest education level: Bachelor's degree (e.g., BA, AB, BS)  Occupational History   Occupation: Retired    Comment: Programmer, systems  Tobacco Use   Smoking status: Former    Current packs/day: 0.00    Average packs/day: 1 pack/day for 30.0 years (30.0 ttl pk-yrs)    Types: Cigarettes    Start date: 05/28/1976    Quit date: 05/28/2006    Years since quitting: 16.8   Smokeless tobacco: Never  Vaping Use   Vaping status: Never Used  Substance and Sexual Activity   Alcohol use: Not Currently    Alcohol/week: 0.0 standard drinks of alcohol   Drug use: No   Sexual activity: Yes    Birth control/protection: None  Other Topics Concern   Not on file  Social History Narrative   Patient lives at home with [name].    Patient has 3 children.    Patient has his BS   Patient is retired.    Drinks about 2 cups of coffee per day.      [facility]:   He is still married. He previously worked as a Programmer, systems. No significant dust exposure. He mostly worked with synthetic fibers. He is from [place].       Social Determinants of Health   Financial Resource Strain: Low Risk  (02/09/2020)   Overall Financial Resource Strain (CARDIA)    Difficulty of Paying Living Expenses: Not hard at all  Food Insecurity: Low Risk  (03/11/2023)   Received from [facility]   Hunger Vital Sign    Worried About Running Out of Food in the Last Year: Never true     Ran Out of Food in the Last Year: Never true  Transportation Needs: No Transportation Needs (03/11/2023)   Received from [facility]    In the past 12 months, has lack of reliable transportation kept you from medical appointments, meetings, work or from getting things needed for daily living? : No  Physical Activity: Insufficiently Active (05/19/2021)   Exercise Vital Sign    Days of Exercise per Week: 1 day  Minutes of Exercise per Session: 10 min  Stress: Stress Concern Present (07/17/2021)   [employer] of Occupational Health - Occupational Stress Questionnaire    Feeling of Stress : To some extent  Social Connections: Socially Integrated (06/09/2019)   Social Connection and Isolation Panel [NHANES]    Frequency of Communication with Friends and Family: More than three times a week    Frequency of Social Gatherings with Friends and Family: More than three times a week    Attends Religious Services: More than 4 times per year    Active Member of [employer] or Organizations: Yes    Attends Engineer, structural: More than 4 times per year    Marital Status: Married  Catering manager Violence: Not At Risk (12/17/2022)   Humiliation, Afraid, Rape, and Kick questionnaire    Fear of Current or Ex-Partner: No    Emotionally Abused: No    Physically Abused: No    Sexually Abused: No   Family History  Problem Relation Age of Onset   Heart disease Mother        Valve replacement and pacemaker   Colon cancer Father    Asthma Other        cousin   Healthy Daughter    Healthy Daughter    Healthy Daughter     Objective: Office vital signs reviewed. BP 111/62   Pulse 68   Temp 98.4 F (36.9 C)   Ht 5\' 10"  (1.778 m)   Wt 155 lb 3.2 oz (70.4 kg)   SpO2 98%   BMI 22.27 kg/m   Physical Examination:  General: Awake, alert, thin elderly male, No acute distress HEENT: Sclera white.  Moist mucous membranes. Cardio: regular rate and rhythm  Pulm:   normal work of breathing on room air MSK: Ambulating with some assistance.  Has walker on hand today.  Assessment/ Plan: 89 y.o. male   Stage 3b chronic kidney disease (HCC)  Chronic constipation  Encounter for immunization - Plan: Flu Vaccine Trivalent High Dose (Fluad)  Physical deconditioning  His renal function seem to be slightly improving based on last lab draw by GI.  Continue current regimen as outlined for chronic constipation.  Continue adequate p.o. intake.  Encourage physical activity to improve strength.   [name], DO [facility] [phone]

## 2023-03-20 ENCOUNTER — Other Ambulatory Visit: Payer: Self-pay

## 2023-03-20 MED FILL — Fluticasone-Umeclidinium-Vilanterol AEPB 100-62.5-25 MCG/ACT: RESPIRATORY_TRACT | 30 days supply | Qty: 60 | Fill #3 | Status: AC

## 2023-03-25 DIAGNOSIS — R338 Other retention of urine: Secondary | ICD-10-CM | POA: Diagnosis not present

## 2023-03-30 ENCOUNTER — Encounter: Payer: Self-pay | Admitting: Family Medicine

## 2023-04-02 ENCOUNTER — Other Ambulatory Visit: Payer: Medicare Other

## 2023-04-02 DIAGNOSIS — D649 Anemia, unspecified: Secondary | ICD-10-CM | POA: Diagnosis not present

## 2023-04-02 DIAGNOSIS — E559 Vitamin D deficiency, unspecified: Secondary | ICD-10-CM | POA: Diagnosis not present

## 2023-04-02 DIAGNOSIS — R809 Proteinuria, unspecified: Secondary | ICD-10-CM | POA: Diagnosis not present

## 2023-04-02 DIAGNOSIS — N189 Chronic kidney disease, unspecified: Secondary | ICD-10-CM | POA: Diagnosis not present

## 2023-04-02 DIAGNOSIS — N1832 Chronic kidney disease, stage 3b: Secondary | ICD-10-CM | POA: Diagnosis not present

## 2023-04-02 DIAGNOSIS — N179 Acute kidney failure, unspecified: Secondary | ICD-10-CM | POA: Diagnosis not present

## 2023-04-02 DIAGNOSIS — D638 Anemia in other chronic diseases classified elsewhere: Secondary | ICD-10-CM | POA: Diagnosis not present

## 2023-04-10 DIAGNOSIS — R600 Localized edema: Secondary | ICD-10-CM | POA: Diagnosis not present

## 2023-04-10 DIAGNOSIS — N1832 Chronic kidney disease, stage 3b: Secondary | ICD-10-CM | POA: Diagnosis not present

## 2023-04-10 DIAGNOSIS — N138 Other obstructive and reflux uropathy: Secondary | ICD-10-CM | POA: Diagnosis not present

## 2023-04-10 DIAGNOSIS — N401 Enlarged prostate with lower urinary tract symptoms: Secondary | ICD-10-CM | POA: Diagnosis not present

## 2023-04-11 ENCOUNTER — Other Ambulatory Visit: Payer: Self-pay | Admitting: *Deleted

## 2023-04-11 MED ORDER — GABAPENTIN 300 MG PO CAPS: 300.0000 mg | ORAL_CAPSULE | Freq: Every day | ORAL | 1 refills | Status: DC

## 2023-04-19 DIAGNOSIS — R338 Other retention of urine: Secondary | ICD-10-CM | POA: Diagnosis not present

## 2023-04-22 ENCOUNTER — Other Ambulatory Visit: Payer: Self-pay | Admitting: Family Medicine

## 2023-04-22 DIAGNOSIS — Z8673 Personal history of transient ischemic attack (TIA), and cerebral infarction without residual deficits: Secondary | ICD-10-CM

## 2023-04-23 ENCOUNTER — Other Ambulatory Visit (HOSPITAL_BASED_OUTPATIENT_CLINIC_OR_DEPARTMENT_OTHER): Payer: Self-pay

## 2023-04-23 ENCOUNTER — Other Ambulatory Visit (HOSPITAL_COMMUNITY): Payer: Self-pay

## 2023-04-23 ENCOUNTER — Other Ambulatory Visit: Payer: Self-pay

## 2023-04-23 MED ORDER — TRELEGY ELLIPTA 100-62.5-25 MCG/ACT IN AEPB
1.0000 | INHALATION_SPRAY | Freq: Every day | RESPIRATORY_TRACT | 6 refills | Status: DC
  Filled 2023-04-23: qty 60, 30d supply, fill #0
  Filled 2023-05-31: qty 60, 30d supply, fill #1

## 2023-04-23 NOTE — Telephone Encounter (Signed)
Copied from CRM [id number]. Topic: Clinical - Medication Refill >> Apr 23, 2023  1:31 PM Joanette Gula wrote: Most Recent Primary Care Visit:  Provider: [facility]  Department: [facility] MED  Visit Type: LAB  Date: 04/02/2023  Medication: Rx #: [id number]  Fluticasone-Umeclidin-Vilant (TRELEGY ELLIPTA) 100-62.5-25 MCG/ACT AEPB    Has the patient contacted their pharmacy? Yes (Agent: If no, request that the patient contact the pharmacy for the refill. If patient does not wish to contact the pharmacy document the reason why and proceed with request.) (Agent: If yes, when and what did the pharmacy advise?)  Is this the correct pharmacy for this prescription? Yes If no, delete pharmacy and type the correct one.  This is the patient's preferred pharmacy:  [facility]  [address], [place], [place] [address] Phone: [phone]   Has the prescription been filled recently? Yes  Is the patient out of the medication? Yes  Has the patient been seen for an appointment in the last year OR does the patient have an upcoming appointment? Yes  Can we respond through MyChart? No  Agent: Please be advised that Rx refills may take up to 3 business days. We ask that you follow-up with your pharmacy.

## 2023-04-24 ENCOUNTER — Other Ambulatory Visit (HOSPITAL_BASED_OUTPATIENT_CLINIC_OR_DEPARTMENT_OTHER): Payer: Self-pay

## 2023-04-26 IMAGING — CT CT CHEST HIGH RESOLUTION W/O CM
3 of 7 series · 16 of 36 positions shown, 18 images · non-contrast
Comparison: None.

CLINICAL DATA: Shortness of breath, bronchiectasis, cough,
productive sputum

EXAM:
CT CHEST WITHOUT CONTRAST
TECHNIQUE: Multidetector CT imaging of the chest was performed following the
standard protocol without intravenous contrast. High resolution
imaging of the lungs, as well as inspiratory and expiratory imaging,
was performed.

[Series 4: lungs · axial · 0.77mm/px · z∈[-18,+216]mm · 5 of 177 slices shown, 7 images]
[im 30/177  mediastinal]
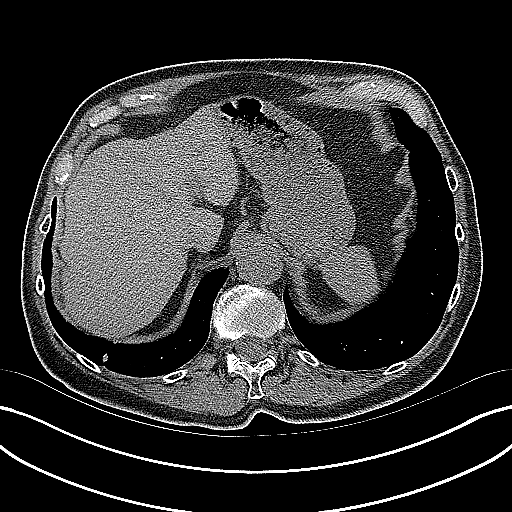
[im 30/177  lung]
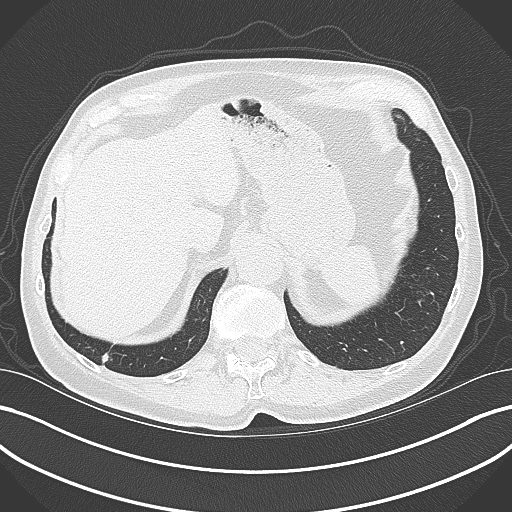
[im 59/177  lung]
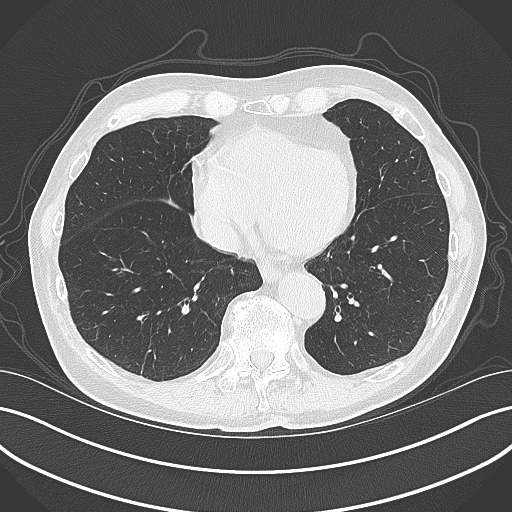
[im 89/177  lung]
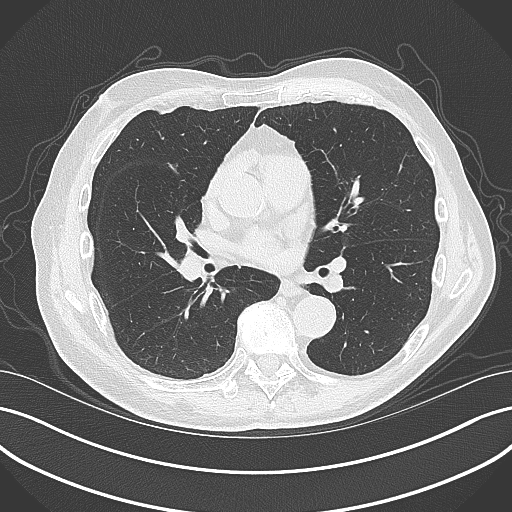
[im 118/177  lung]
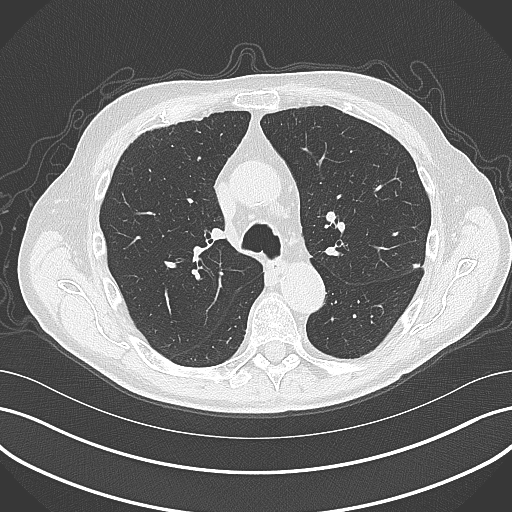
[im 147/177  mediastinal]
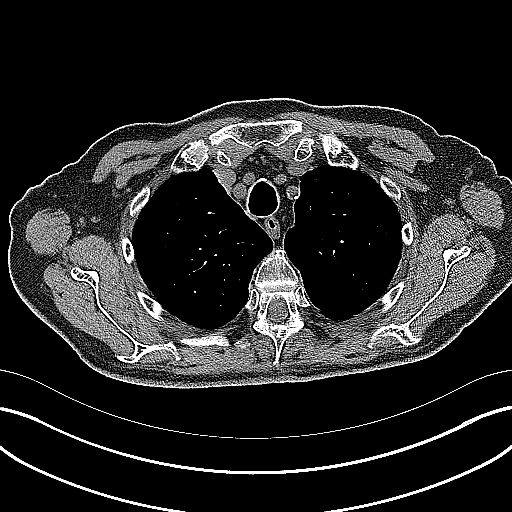
[im 147/177  lung]
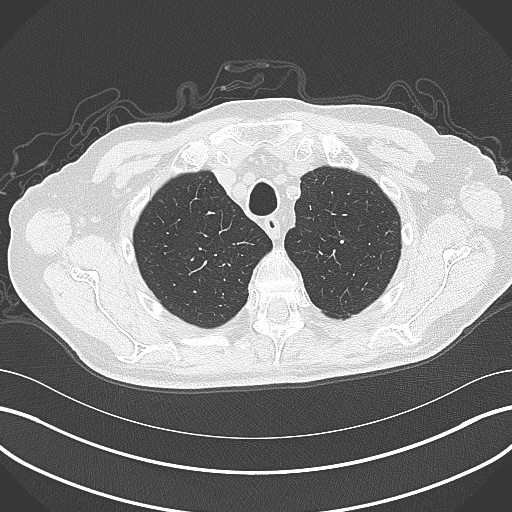

[Series 7: high res thins · axial · 0.77mm/px · z∈[-28,+248]mm · 8 of 332 slices shown]
[im 28/332  lung]
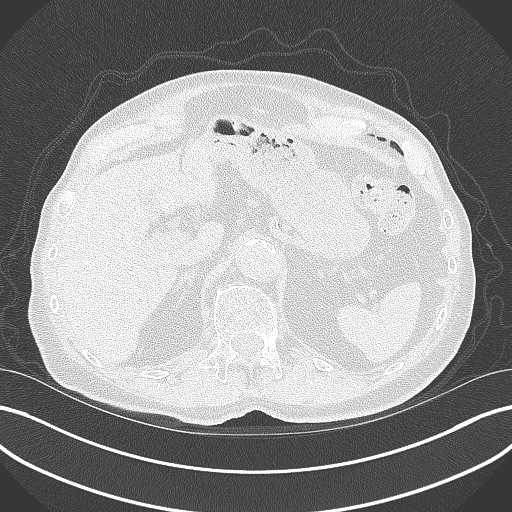
[im 83/332  lung]
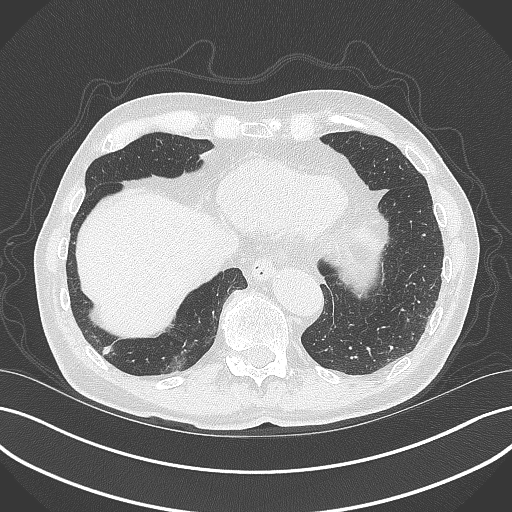
[im 111/332  lung]
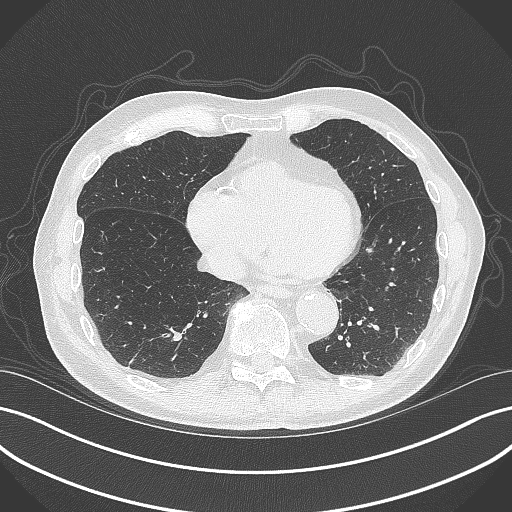
[im 138/332  lung]
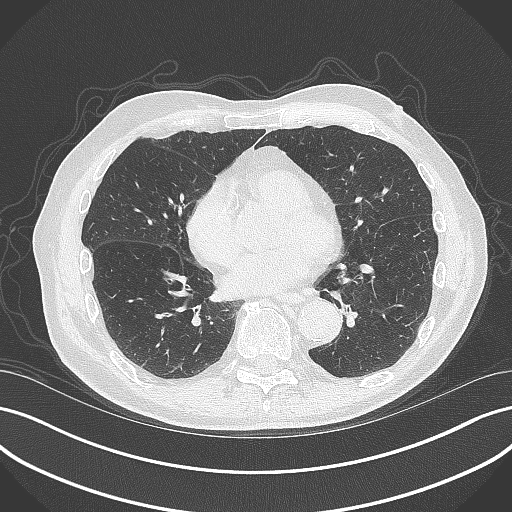
[im 194/332  lung]
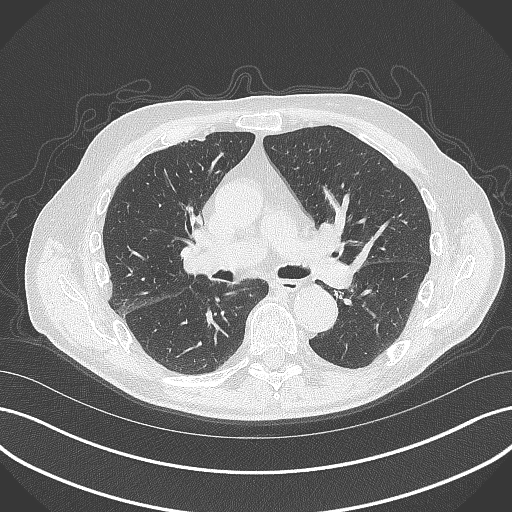
[im 221/332  lung]
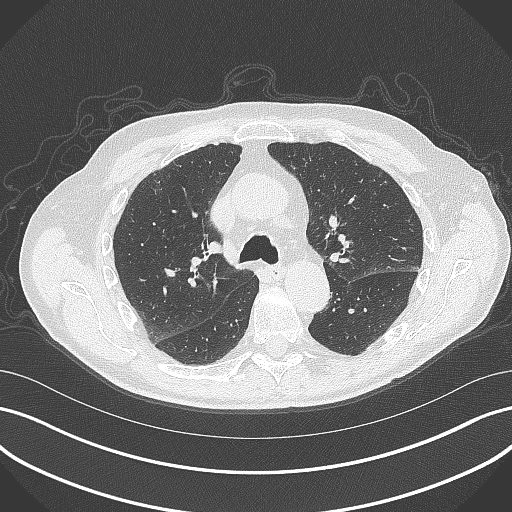
[im 249/332  lung]
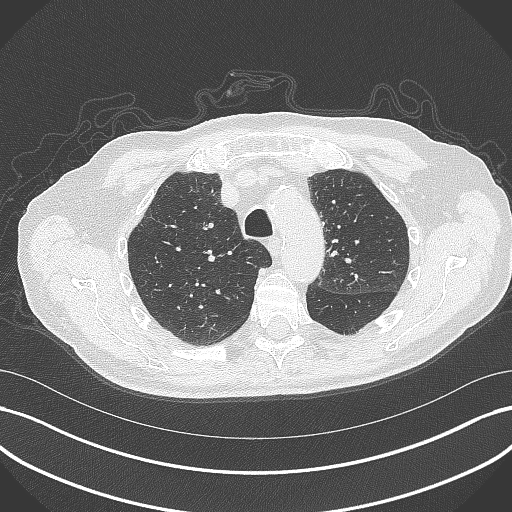
[im 304/332  lung]
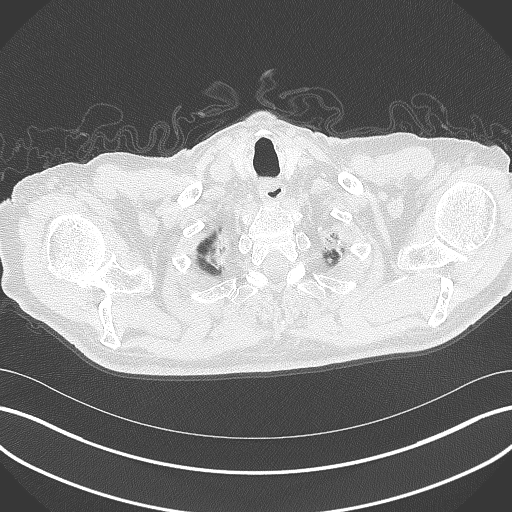

[Series 8: cor soft · coronal · 0.71mm/px · 3 of 142 slices shown]
[im 29/142  lung]
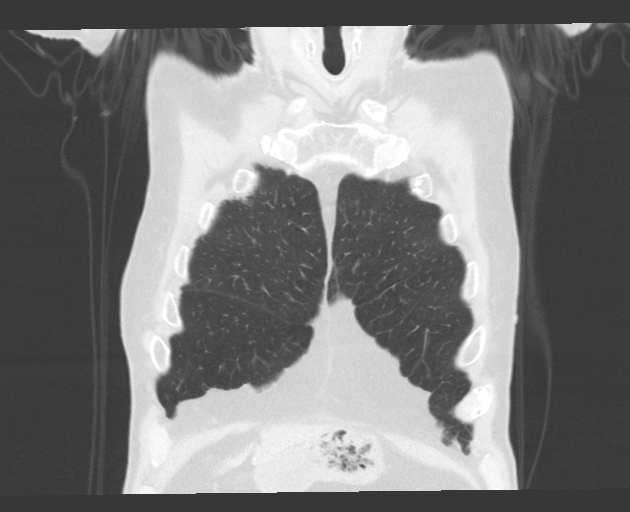
[im 57/142  lung]
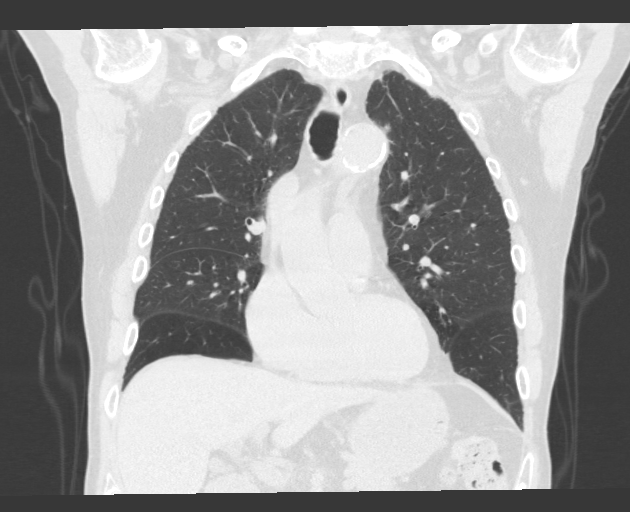
[im 85/142  lung]
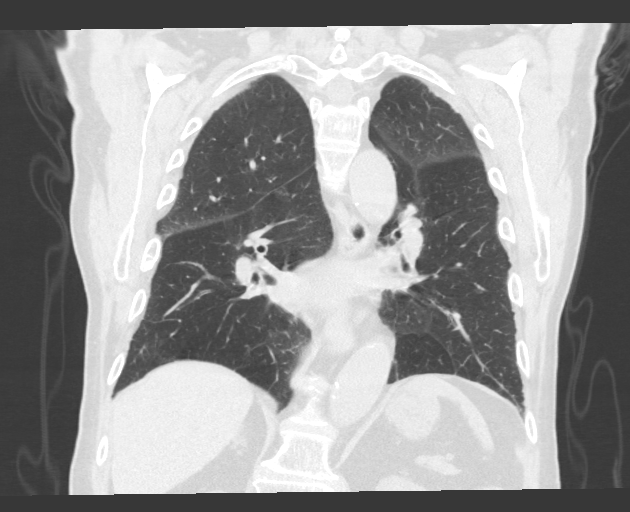

[16 of 36 positions shown; findings below may reference images not displayed]

FINDINGS: Cardiovascular: Aortic atherosclerosis. Normal heart size.
Three-vessel coronary artery calcifications. No pericardial
effusion.

Mediastinum/Nodes: No enlarged mediastinal, hilar, or axillary lymph
nodes. Benign, calcified left hilar lymph nodes. Thyroid gland,
trachea, and esophagus demonstrate no significant findings.

Lungs/Pleura: Mild, diffuse bilateral bronchial wall thickening.
Mild, bland appearing, bandlike scarring of the bilateral lung
bases. No significant air trapping on expiratory phase imaging no
pleural effusion or pneumothorax.

Upper Abdomen: No acute abnormality.

Musculoskeletal: No chest wall mass or suspicious bone lesions
identified.
IMPRESSION: 1. Mild, diffuse bilateral bronchial wall thickening, consistent
with nonspecific infectious or inflammatory bronchitis.
2. No evidence of fibrotic interstitial lung disease. Mild, bland
appearing post infectious or inflammatory scarring of the lung
bases.
3. Coronary artery disease.

Aortic Atherosclerosis ([id number]-CB8.8).

## 2023-05-01 DIAGNOSIS — N401 Enlarged prostate with lower urinary tract symptoms: Secondary | ICD-10-CM | POA: Diagnosis not present

## 2023-05-01 DIAGNOSIS — R338 Other retention of urine: Secondary | ICD-10-CM | POA: Diagnosis not present

## 2023-05-03 DIAGNOSIS — J432 Centrilobular emphysema: Secondary | ICD-10-CM | POA: Diagnosis not present

## 2023-05-03 DIAGNOSIS — J479 Bronchiectasis, uncomplicated: Secondary | ICD-10-CM | POA: Diagnosis not present

## 2023-05-07 ENCOUNTER — Ambulatory Visit: Payer: Medicare Other | Admitting: Family Medicine

## 2023-05-07 ENCOUNTER — Encounter: Payer: Self-pay | Admitting: Family Medicine

## 2023-05-07 VITALS — BP 146/83 | HR 65 | Temp 97.2°F | Ht 70.0 in | Wt 159.2 lb

## 2023-05-07 DIAGNOSIS — M79601 Pain in right arm: Secondary | ICD-10-CM | POA: Diagnosis not present

## 2023-05-07 DIAGNOSIS — R21 Rash and other nonspecific skin eruption: Secondary | ICD-10-CM

## 2023-05-07 DIAGNOSIS — M79609 Pain in unspecified limb: Secondary | ICD-10-CM

## 2023-05-07 DIAGNOSIS — N1832 Chronic kidney disease, stage 3b: Secondary | ICD-10-CM

## 2023-05-07 DIAGNOSIS — R600 Localized edema: Secondary | ICD-10-CM | POA: Diagnosis not present

## 2023-05-07 DIAGNOSIS — R202 Paresthesia of skin: Secondary | ICD-10-CM

## 2023-05-07 DIAGNOSIS — M79602 Pain in left arm: Secondary | ICD-10-CM | POA: Diagnosis not present

## 2023-05-07 NOTE — Progress Notes (Signed)
Subjective:  Patient ID: Charles Golden, male    DOB: Jan 30, 1926, 89 y.o.   MRN: [id number]  Patient Care Team: [name], DO as PCP - General (Family Medicine) Bjorn Pippin, MD (Urology) Sharrell Ku, MD (Gastroenterology) Ernesto Rutherford, MD (Ophthalmology) Ollen Gross, MD as Consulting Physician (Orthopedic Surgery) Lupita Leash, MD (Inactive) as Consulting Physician (Pulmonary Disease) York Spaniel, MD (Inactive) as Consulting Physician (Neurology) Coralyn Helling, MD (Inactive) as Consulting Physician (Pulmonary Disease) Weyman Croon Radiology, MD as Rounding Team (Interventional Radiology) Shirleen Schirmer, PA-C as Physician Assistant (Internal Medicine) Christinia Gully, AUD (Audiology)   Chief Complaint:  Pain (Feels like pins and needles all over mainly in arms, legs, back for about a week)   HPI: Charles Golden is a 89 y.o. male presenting on 05/07/2023 for Pain (Feels like pins and needles all over mainly in arms, legs, back for about a week) Patient presents today with daughter with multiple complaints. Reports that he has nerve pain all over his body especially in bilateral lower extremities and upper extremities. Reports that pain is sometimes in his back. Describes pain as pins and needles all over. States that it got so bad, that he considered going to ED. They have tried salon pas gel and tylenol. Neither are helping very much.  He reports that he has red spots on forearms. He previously had similar rash on back that resolved with Eucerin. He has not used Eucerin recently. In addition, reports that his legs are swollen and feet are numb. He is established with neurosurgery and nephrology. Reports that nephrologist, Wolfgang Phoenix gave them lasix as needed for swelling. He has been taking it every other day due to renal function. He does not wear compression stockings and he does elevate his legs frequently.   Relevant past medical, surgical, family,  and social history reviewed and updated as indicated.  Allergies and medications reviewed and updated. Data reviewed: Chart in Epic.  Past Medical History:  Diagnosis Date   Acute medial meniscal tear 07/20/2014   Atrial fibrillation with RVR (HCC) 05/10/2021   Chronic headache 08/05/2013   COPD (chronic obstructive pulmonary disease) (HCC)    CVA (cerebral vascular accident) (HCC) 05/01/2021   Enlarged prostate    GERD (gastroesophageal reflux disease)    Glaucoma    Glucagonoma    Hernia, incisional    at present   [facility] (hard of hearing)    IBS (irritable bowel syndrome)    Incisional hernia, without obstruction or gangrene 08/05/2013   Sciatic pain    Stage 3b chronic kidney disease (CKD) (HCC)     Past Surgical History:  Procedure Laterality Date   BOWEL RESECTION N/A 09/04/2012   Procedure: SMALL BOWEL RESECTION;  Surgeon: Fabio Bering, MD;  Location: [facility];  Service: General;  Laterality: N/A;  Anastimosis   HEMORRHOID SURGERY     HERNIA REPAIR Bilateral 70's   INGUINAL HERNIA REPAIR Left 09/14/2013   Procedure: LEFT INGUINAL HERNIORRHAPHY;  Surgeon: Dalia Heading, MD;  Location: [facility];  Service: General;  Laterality: Left;   INGUINAL HERNIA REPAIR Right 12/23/2018   Procedure: RECURRENT RIGHT INGUINAL HERNIA  REPAIR  WITH MESH;  Surgeon: Franky Macho, MD;  Location: [facility];  Service: General;  Laterality: Right;   INSERTION OF MESH Left 09/14/2013   Procedure: INSERTION OF MESH;  Surgeon: Dalia Heading, MD;  Location: [facility];  Service: General;  Laterality: Left;   IR ANGIO INTRA EXTRACRAN SEL INTERNAL CAROTID  UNI L MOD SED  05/04/2021   IR INTRAVSC STENT CERV CAROTID W/EMB-PROT MOD SED INCL ANGIO  05/04/2021   IR US GUIDE VASC ACCESS RIGHT  05/04/2021   KNEE ARTHROSCOPY Left 07/21/2014   Procedure: LEFT KNEE ARTHROSCOPY WITH MEDIAL MENISCAL DEBRIDEMENT ;  Surgeon: Loanne Drilling, MD;  Location: [facility];  Service: Orthopedics;  Laterality: Left;   LAPAROTOMY N/A  09/04/2012   Procedure: EXPLORATORY LAPAROTOMY;  Surgeon: Fabio Bering, MD;  Location: [facility];  Service: General;  Laterality: N/A;   RADIOLOGY WITH ANESTHESIA Left 05/04/2021   Procedure: LEFT ICA STENT;  Surgeon: Baldemar Lenis, MD;  Location[facility];  Service: Radiology;  Laterality: Left;   SKIN LESION EXCISION     Dr Lazarus Salines   TONSILLECTOMY      Social History   Socioeconomic History   Marital status: Married    Spouse name: [name]   Number of children: 3   Years of education: 12+   Highest education level: Bachelor's degree (e.g., BA, AB, BS)  Occupational History   Occupation: Retired    Comment: Programmer, systems  Tobacco Use   Smoking status: Former    Current packs/day: 0.00    Average packs/day: 1 pack/day for 30.0 years (30.0 ttl pk-yrs)    Types: Cigarettes    Start date: 05/28/1976    Quit date: 05/28/2006    Years since quitting: 16.9   Smokeless tobacco: Never  Vaping Use   Vaping status: Never Used  Substance and Sexual Activity   Alcohol use: Not Currently    Alcohol/week: 0.0 standard drinks of alcohol   Drug use: No   Sexual activity: Yes    Birth control/protection: None  Other Topics Concern   Not on file  Social History Narrative   Patient lives at home with [name].    Patient has 3 children.    Patient has his BS   Patient is retired.    Drinks about 2 cups of coffee per day.      [facility]:   He is still married. He previously worked as a Programmer, systems. No significant dust exposure. He mostly worked with synthetic fibers. He is from [place].       Social Determinants of Health   Financial Resource Strain: Low Risk  (02/09/2020)   Overall Financial Resource Strain (CARDIA)    Difficulty of Paying Living Expenses: Not hard at all  Food Insecurity: Low Risk  (05/03/2023)   Received from [facility]   Hunger Vital Sign    Worried About Running Out of Food in the Last Year: Never true    Ran Out of Food in  the Last Year: Never true  Transportation Needs: No Transportation Needs (05/03/2023)   Received from [facility]    In the past 12 months, has lack of reliable transportation kept you from medical appointments, meetings, work or from getting things needed for daily living? : No  Physical Activity: Insufficiently Active (05/19/2021)   Exercise Vital Sign    Days of Exercise per Week: 1 day    Minutes of Exercise per Session: 10 min  Stress: Stress Concern Present (07/17/2021)   [employer] of Occupational Health - Occupational Stress Questionnaire    Feeling of Stress : To some extent  Social Connections: Socially Integrated (06/09/2019)   Social Connection and Isolation Panel [NHANES]    Frequency of Communication with Friends and Family: More than three times a week  Frequency of Social Gatherings with Friends and Family: More than three times a week    Attends Religious Services: More than 4 times per year    Active Member of [employer] or Organizations: Yes    Attends Engineer, structural: More than 4 times per year    Marital Status: Married  Catering manager Violence: Not At Risk (12/17/2022)   Humiliation, Afraid, Rape, and Kick questionnaire    Fear of Current or Ex-Partner: No    Emotionally Abused: No    Physically Abused: No    Sexually Abused: No    Outpatient Encounter Medications as of 05/07/2023  Medication Sig   acetaminophen (TYLENOL) 500 MG tablet Take 1,000 mg by mouth daily as needed for headache or moderate pain.   apixaban (ELIQUIS) 2.5 MG TABS tablet Take 1 tablet (2.5 mg total) by mouth 2 (two) times daily.   atorvastatin (LIPITOR) 40 MG tablet TAKE ONE TABLET ONCE DAILY   azelastine (ASTELIN) 0.1 % nasal spray PLACE 1 SPRAY IN EACH NOSTRIL ONCE A DAY AS DIRECTED (Patient taking differently: Place 1 spray into both nostrils at bedtime.)   cholecalciferol (CHOLECALCIFEROL) 25 MCG tablet Take 1 tablet (1,000 Units total) by mouth  every evening.   CRANBERRY PO Take 1 tablet by mouth every evening.   Cyanocobalamin (VITAMIN B-12 PO) Take 1 tablet by mouth every evening.   ferrous sulfate 325 (65 FE) MG EC tablet Take 325 mg by mouth daily.   finasteride (PROSCAR) 5 MG tablet Take 5 mg by mouth daily.   fluticasone (FLONASE) 50 MCG/ACT nasal spray USE 2 SPRAYS IN EACH NOSTRIL ONCE DAILY   Fluticasone-Umeclidin-Vilant (TRELEGY ELLIPTA) 100-62.5-25 MCG/ACT AEPB Inhale 1 puff into the lungs every evening. (Patient taking differently: Inhale 1 puff into the lungs daily.)   gabapentin (NEURONTIN) 300 MG capsule Take 1 capsule (300 mg total) by mouth at bedtime.   latanoprost (XALATAN) 0.005 % ophthalmic solution Place 1 drop into both eyes at bedtime.   lubiprostone (AMITIZA) 24 MCG capsule Take 1 capsule (24 mcg total) by mouth 2 (two) times daily with a meal. For constipation (Patient taking differently: Take 24 mcg by mouth daily with breakfast. For constipation)   mirtazapine (REMERON) 7.5 MG tablet Take 1 tablet (7.5 mg total) by mouth at bedtime.   polycarbophil (FIBERCON) 625 MG tablet Take 1 tablet (625 mg total) by mouth daily.   senna (SENOKOT) 8.6 MG TABS tablet Take 1 tablet (8.6 mg total) by mouth 2 (two) times daily.   sodium zirconium cyclosilicate (LOKELMA) 10 g PACK packet Take 10 g by mouth 2 (two) times daily.   timolol (TIMOPTIC) 0.5 % ophthalmic solution Place 1 drop into the left eye every morning.   TRELEGY ELLIPTA 100-62.5-25 MCG/ACT AEPB Inhale 1 puff into the lungs daily.   VENTOLIN HFA 108 (90 Base) MCG/ACT inhaler INHALE 2 PUFFS EVERY 6 HOURS AS NEEDED FOR WHEEZING OR SHORTNESS OF BREATH   No facility-administered encounter medications on file as of 05/07/2023.    Allergies  Allergen Reactions   Demerol [Meperidine] Other (See Comments)    Pt. States "he woke up during a colonoscopy"   Spiriva Handihaler [Tiotropium Bromide Monohydrate] Other (See Comments)    Mild Urinary Retention    Review  of Systems As per HPI  Objective:  BP (!) 146/83   Pulse 65   Temp (!) 97.2 F (36.2 C) (Temporal)   Ht 5\' 10"  (1.778 m)   Wt 159 lb 3.2 oz (72.2 kg)  SpO2 100%   BMI 22.84 kg/m    Wt Readings from Last 3 Encounters:  05/07/23 159 lb 3.2 oz (72.2 kg)  03/19/23 155 lb 3.2 oz (70.4 kg)  01/25/23 144 lb (65.3 kg)    Physical Exam Constitutional:      General: He is awake. He is not in acute distress.    Appearance: Normal appearance. He is well-developed and well-groomed. He is not ill-appearing, toxic-appearing or diaphoretic.  HENT:     Head:     Comments: Hard of hearing  Cardiovascular:     Rate and Rhythm: Normal rate and regular rhythm.     Heart sounds: Normal heart sounds.  Pulmonary:     Effort: Pulmonary effort is normal.     Breath sounds: Normal breath sounds. No stridor, decreased air movement or transmitted upper airway sounds. No decreased breath sounds.  Musculoskeletal:     Right lower leg: 3+ Edema present.     Left lower leg: 3+ Edema present.     Comments: Generalized weakness, walks with walker.   Skin:    Findings: Rash present. Rash is macular, papular, purpuric and scaling. Rash is not urticarial.     Comments: Small, flat, erythematous lesions along bilateral forearms. Negative for rash along lower extremities or back   Neurological:     General: No focal deficit present.     Mental Status: He is alert, oriented to person, place, and time and easily aroused. Mental status is at baseline.  Psychiatric:        Attention and Perception: Attention and perception normal.        Mood and Affect: Mood and affect normal.        Speech: Speech normal.        Behavior: Behavior is cooperative.        Thought Content: Thought content normal.        Cognition and Memory: Cognition and memory normal.     Results for orders placed or performed in visit on 01/25/23  CBC with Differential/Platelet  Result Value Ref Range   WBC 7.3 3.4 - 10.8 x10E3/uL    RBC 3.41 (L) 4.14 - 5.80 x10E6/uL   Hemoglobin 10.3 (L) 13.0 - 17.7 g/dL   Hematocrit 16.1 (L) 09.6 - 51.0 %   MCV 93 79 - 97 fL   MCH 30.2 26.6 - 33.0 pg   MCHC 32.5 31.5 - 35.7 g/dL   RDW 04.5 40.9 - 81.1 %   Platelets 510 (H) 150 - 450 x10E3/uL   Neutrophils 61 Not Estab. %   Lymphs 25 Not Estab. %   Monocytes 11 Not Estab. %   Eos 3 Not Estab. %   Basos 0 Not Estab. %   Neutrophils Absolute 4.5 1.4 - 7.0 x10E3/uL   Lymphocytes Absolute 1.8 0.7 - 3.1 x10E3/uL   Monocytes Absolute 0.8 0.1 - 0.9 x10E3/uL   EOS (ABSOLUTE) 0.2 0.0 - 0.4 x10E3/uL   Basophils Absolute 0.0 0.0 - 0.2 x10E3/uL  Renal Function Panel  Result Value Ref Range   Glucose 98 70 - 99 mg/dL   BUN 21 10 - 36 mg/dL   Creatinine, Ser 9.14 (H) 0.76 - 1.27 mg/dL   eGFR 31 (L) >78 GN/FAO/1.30   BUN/Creatinine Ratio 11 10 - 24   Sodium 140 134 - 144 mmol/L   Potassium 5.3 (H) 3.5 - 5.2 mmol/L   Chloride 107 (H) 96 - 106 mmol/L   CO2 23 20 - 29 mmol/L   Calcium  9.6 8.6 - 10.2 mg/dL   Phosphorus 3.3 2.8 - 4.1 mg/dL   Albumin 3.5 (L) 3.6 - 4.6 g/dL       16/02/9603    5:40 AM 03/19/2023    8:54 AM 01/25/2023   11:53 AM 01/10/2023   10:40 AM 12/14/2022   11:28 AM  Depression screen PHQ 2/9  Decreased Interest 0 0 0 0 0  Down, Depressed, Hopeless 0 0 0 0 0  PHQ - 2 Score 0 0 0 0 0  Altered sleeping 2 0 0  0  Tired, decreased energy 3 0 0  0  Change in appetite 1 0 0  0  Feeling bad or failure about yourself  0 0 0  0  Trouble concentrating 0 0 0  0  Moving slowly or fidgety/restless 0 0 0  0  Suicidal thoughts 0 0 0  0  PHQ-9 Score 6 0 0  0  Difficult doing work/chores Not difficult at all Not difficult at all Not difficult at all  Not difficult at all       05/07/2023    9:12 AM 03/19/2023    8:54 AM 01/25/2023   11:53 AM 12/14/2022   11:28 AM  GAD 7 : Generalized Anxiety Score  Nervous, Anxious, on Edge 0 0 0 0  Control/stop worrying 0 0 0 0  Worry too much - different things 0 0 0 0  Trouble  relaxing 1 0 0 0  Restless 0 0 0 0  Easily annoyed or irritable 0 0 0 0  Afraid - awful might happen 0 0 0 0  Total GAD 7 Score 1 0 0 0  Anxiety Difficulty Not difficult at all Not difficult at all Not difficult at all Not difficult at all   Pertinent labs & imaging results that were available during my care of the patient were reviewed by me and considered in my medical decision making.  Assessment & Plan:  Layman was seen today for pain.  Diagnoses and all orders for this visit:  Paresthesia and pain of extremity Will collect labs as below. Recommended patient start gabapentin twice daily. Reviewed CKD recommendations and patient would still be under threshold of 900 mg/day based on his last GFR. Recommend patient wear compression stockings and elevate legs frequently. Continue lasix. Recommend patient follow up with nephrology and neurosurgery. Patient and family do not wish to pursue palliative care at this time. We are weary of starting opioid pain management at this time as patient had decreased respiratory drive in the past due to opioids. Patient and family to discuss if they would like referral to physical medicine rehabilitation.  -     Anemia Profile B -     CMP14+EGFR -     Magnesium  Stage 3b chronic kidney disease (HCC) Reviewed last GFR. Renal dosing for gabapentin. Discussed with patient to follow up with PCP and Nephrology. Continue lasix. Can consider uremic pruritus as rash. Encouraged patient to use emollient and follow up if not improving.  -     Anemia Profile B -     CMP14+EGFR -     Magnesium  Bilateral lower extremity edema Will collect labs as below. Recommended patient start gabapentin twice daily. Recommend patient wear compression stockings and elevate legs frequently. Continue lasix. Recommend patient follow up with nephrology and neurosurgery. Patient and family do not wish to pursue palliative care at this time. -     Anemia Profile B -  CMP14+EGFR -      Magnesium  Rash Can consider uremic pruritus as rash. Encouraged patient to use emollient and follow up if not improving.   Continue all other maintenance medications.  Follow up plan: Return if symptoms worsen or fail to improve.   Continue healthy lifestyle choices, including diet (rich in fruits, vegetables, and lean proteins, and low in salt and simple carbohydrates) and exercise (at least 30 minutes of moderate physical activity daily).  Written and verbal instructions provided   The above assessment and management plan was discussed with the patient. The patient verbalized understanding of and has agreed to the management plan. Patient is aware to call the clinic if they develop any new symptoms or if symptoms persist or worsen. Patient is aware when to return to the clinic for a follow-up visit. Patient educated on when it is appropriate to go to the emergency department.   Neale Burly, DNP-FNP [facility] [address] [place], [place] [address] [phone]

## 2023-05-08 LAB — CMP14+EGFR
ALT: 22 IU/L (ref 0–44)
AST: 24 IU/L (ref 0–40)
Albumin: 3.7 g/dL (ref 3.6–4.6)
Alkaline Phosphatase: 113 IU/L (ref 44–121)
BUN/Creatinine Ratio: 13 (ref 10–24)
BUN: 20 mg/dL (ref 10–36)
Bilirubin Total: 0.3 mg/dL (ref 0.0–1.2)
CO2: 22 mmol/L (ref 20–29)
Calcium: 9.1 mg/dL (ref 8.6–10.2)
Chloride: 108 mmol/L — ABNORMAL HIGH (ref 96–106)
Creatinine, Ser: 1.55 mg/dL — ABNORMAL HIGH (ref 0.76–1.27)
Globulin, Total: 2.7 g/dL (ref 1.5–4.5)
Glucose: 98 mg/dL (ref 70–99)
Potassium: 4.5 mmol/L (ref 3.5–5.2)
Sodium: 141 mmol/L (ref 134–144)
Total Protein: 6.4 g/dL (ref 6.0–8.5)
eGFR: 41 mL/min/{1.73_m2} — ABNORMAL LOW (ref >59)

## 2023-05-08 LAB — ANEMIA PROFILE B
Basophils Absolute: 0 10*3/uL (ref 0.0–0.2)
Basos: 0 %
EOS (ABSOLUTE): 0.3 10*3/uL (ref 0.0–0.4)
Eos: 4 %
Ferritin: 372 ng/mL (ref 30–400)
Folate: 10.2 ng/mL (ref >3.0)
Hematocrit: 38.3 % (ref 37.5–51.0)
Hemoglobin: 12 g/dL — ABNORMAL LOW (ref 13.0–17.7)
Immature Grans (Abs): 0 10*3/uL (ref 0.0–0.1)
Immature Granulocytes: 0 %
Iron Saturation: 35 % (ref 15–55)
Iron: 77 ug/dL (ref 38–169)
Lymphocytes Absolute: 2.3 10*3/uL (ref 0.7–3.1)
Lymphs: 29 %
MCH: 30.4 pg (ref 26.6–33.0)
MCHC: 31.3 g/dL — ABNORMAL LOW (ref 31.5–35.7)
MCV: 97 fL (ref 79–97)
Monocytes Absolute: 0.6 10*3/uL (ref 0.1–0.9)
Monocytes: 8 %
Neutrophils Absolute: 4.5 10*3/uL (ref 1.4–7.0)
Neutrophils: 59 %
Platelets: 274 10*3/uL (ref 150–450)
RBC: 3.95 x10E6/uL — ABNORMAL LOW (ref 4.14–5.80)
RDW: 13.3 % (ref 11.6–15.4)
Retic Ct Pct: 1.8 % (ref 0.6–2.6)
Total Iron Binding Capacity: 221 ug/dL — ABNORMAL LOW (ref 250–450)
UIBC: 144 ug/dL (ref 111–343)
Vitamin B-12: 762 pg/mL (ref 232–1245)
WBC: 7.8 10*3/uL (ref 3.4–10.8)

## 2023-05-08 LAB — MAGNESIUM: Magnesium: 1.9 mg/dL (ref 1.6–2.3)

## 2023-05-08 NOTE — Progress Notes (Signed)
Labs stable. No acute changes to explain pain. Recommend patient continue with increased gabapentin and use of emollient (lotion). If symptoms continue, please follow up.

## 2023-05-14 ENCOUNTER — Other Ambulatory Visit (HOSPITAL_BASED_OUTPATIENT_CLINIC_OR_DEPARTMENT_OTHER): Payer: Self-pay

## 2023-05-14 ENCOUNTER — Other Ambulatory Visit: Payer: Self-pay | Admitting: Family Medicine

## 2023-05-14 DIAGNOSIS — I4811 Longstanding persistent atrial fibrillation: Secondary | ICD-10-CM

## 2023-05-14 MED ORDER — APIXABAN 2.5 MG PO TABS
2.5000 mg | ORAL_TABLET | Freq: Two times a day (BID) | ORAL | 0 refills | Status: DC
  Filled 2023-05-14: qty 180, 90d supply, fill #0

## 2023-05-15 DIAGNOSIS — K5909 Other constipation: Secondary | ICD-10-CM | POA: Diagnosis not present

## 2023-05-15 DIAGNOSIS — D508 Other iron deficiency anemias: Secondary | ICD-10-CM | POA: Diagnosis not present

## 2023-05-16 DIAGNOSIS — N401 Enlarged prostate with lower urinary tract symptoms: Secondary | ICD-10-CM | POA: Diagnosis not present

## 2023-05-16 DIAGNOSIS — R338 Other retention of urine: Secondary | ICD-10-CM | POA: Diagnosis not present

## 2023-05-17 ENCOUNTER — Encounter: Payer: Self-pay | Admitting: Family Medicine

## 2023-05-17 ENCOUNTER — Ambulatory Visit: Payer: Medicare Other | Admitting: Family Medicine

## 2023-05-17 VITALS — BP 105/62 | HR 75 | Temp 98.3°F | Ht 70.0 in | Wt 158.0 lb

## 2023-05-17 DIAGNOSIS — R5381 Other malaise: Secondary | ICD-10-CM

## 2023-05-17 DIAGNOSIS — N1832 Chronic kidney disease, stage 3b: Secondary | ICD-10-CM

## 2023-05-17 DIAGNOSIS — R600 Localized edema: Secondary | ICD-10-CM | POA: Diagnosis not present

## 2023-05-17 DIAGNOSIS — M48062 Spinal stenosis, lumbar region with neurogenic claudication: Secondary | ICD-10-CM | POA: Diagnosis not present

## 2023-05-17 DIAGNOSIS — L570 Actinic keratosis: Secondary | ICD-10-CM | POA: Diagnosis not present

## 2023-05-17 MED ORDER — GABAPENTIN 300 MG PO CAPS: 300.0000 mg | ORAL_CAPSULE | Freq: Two times a day (BID) | ORAL | 1 refills | Status: DC

## 2023-05-17 NOTE — Progress Notes (Signed)
Subjective: CC: General follow-up PCP: [name], DO ZOX:WRUEA R Mcclusky is a 89 y.o. male presenting to clinic today for:  1.  Lumbar radiculopathy with neurogenic claudication/ physical deconditioning, CKD, BPH Patient is under the care of spinal specialist, Dr. Franky Macho.  His daughter is present for today's visit and she notes that she tried to get contact with him recently but has not yet heard back for an appointment.  The patient has been having some worsening radicular symptoms.  He was advised to increase his gabapentin to twice daily dosing and he notes that over the last couple of days the pain has been present but not as severe.  He reports chronic lower extremity edema.  He does suffer from CKD and was given some Lasix to use as needed.  His daughter gives him this roughly every other day for fear that they will exacerbate any further renal dysfunction.  He reports ongoing poor energy and generalized weakness but admits that he has not done anything to get any physical activity as directed at last visit.  He sometimes walks with a walker.  His daughter notes that he pretty much is sedentary.  He remains dependent on a urinary catheter.  She was saw urology yesterday but he did not pass his urinary test.  2.  Skin lesion Patient reports that he has a spot on the tip of his right nose that crusts and then he picks it off and it bleeds.   ROS: Per HPI  Allergies  Allergen Reactions   Demerol [Meperidine] Other (See Comments)    Pt. States "he woke up during a colonoscopy"   Spiriva Handihaler [Tiotropium Bromide Monohydrate] Other (See Comments)    Mild Urinary Retention   Past Medical History:  Diagnosis Date   Acute medial meniscal tear 07/20/2014   Atrial fibrillation with RVR (HCC) 05/10/2021   Chronic headache 08/05/2013   COPD (chronic obstructive pulmonary disease) (HCC)    CVA (cerebral vascular accident) (HCC) 05/01/2021   Enlarged prostate    GERD  (gastroesophageal reflux disease)    Glaucoma    Glucagonoma    Hernia, incisional    at present   [facility] (hard of hearing)    IBS (irritable bowel syndrome)    Incisional hernia, without obstruction or gangrene 08/05/2013   Sciatic pain    Stage 3b chronic kidney disease (CKD) (HCC)     Current Outpatient Medications:    acetaminophen (TYLENOL) 500 MG tablet, Take 1,000 mg by mouth daily as needed for headache or moderate pain., Disp: , Rfl:    apixaban (ELIQUIS) 2.5 MG TABS tablet, Take 1 tablet (2.5 mg total) by mouth 2 (two) times daily., Disp: 180 tablet, Rfl: 0   atorvastatin (LIPITOR) 40 MG tablet, TAKE ONE TABLET ONCE DAILY, Disp: 90 tablet, Rfl: 0   azelastine (ASTELIN) 0.1 % nasal spray, PLACE 1 SPRAY IN EACH NOSTRIL ONCE A DAY AS DIRECTED (Patient taking differently: Place 1 spray into both nostrils at bedtime.), Disp: 30 mL, Rfl: 5   cholecalciferol (CHOLECALCIFEROL) 25 MCG tablet, Take 1 tablet (1,000 Units total) by mouth every evening., Disp: 30 tablet, Rfl: 0   CRANBERRY PO, Take 1 tablet by mouth every evening., Disp: , Rfl:    Cyanocobalamin (VITAMIN B-12 PO), Take 1 tablet by mouth every evening., Disp: , Rfl:    ferrous sulfate 325 (65 FE) MG EC tablet, Take 325 mg by mouth daily., Disp: , Rfl:    finasteride (PROSCAR) 5 MG tablet, Take  5 mg by mouth daily., Disp: , Rfl:    fluticasone (FLONASE) 50 MCG/ACT nasal spray, USE 2 SPRAYS IN EACH NOSTRIL ONCE DAILY, Disp: 16 g, Rfl: 5   Fluticasone-Umeclidin-Vilant (TRELEGY ELLIPTA) 100-62.5-25 MCG/ACT AEPB, Inhale 1 puff into the lungs every evening. (Patient taking differently: Inhale 1 puff into the lungs daily.), Disp: 60 each, Rfl: 5   furosemide (LASIX) 20 MG tablet, Take 20 mg by mouth every other day., Disp: , Rfl:    gabapentin (NEURONTIN) 300 MG capsule, Take 1 capsule (300 mg total) by mouth at bedtime., Disp: 90 capsule, Rfl: 1   latanoprost (XALATAN) 0.005 % ophthalmic solution, Place 1 drop into both eyes at  bedtime., Disp: 2.5 mL, Rfl: 0   lubiprostone (AMITIZA) 24 MCG capsule, Take 1 capsule (24 mcg total) by mouth 2 (two) times daily with a meal. For constipation (Patient taking differently: Take 24 mcg by mouth daily with breakfast. For constipation), Disp: 180 capsule, Rfl: 3   mirtazapine (REMERON) 7.5 MG tablet, Take 1 tablet (7.5 mg total) by mouth at bedtime., Disp: 90 tablet, Rfl: 3   polycarbophil (FIBERCON) 625 MG tablet, Take 1 tablet (625 mg total) by mouth daily., Disp: 30 tablet, Rfl: 0   senna (SENOKOT) 8.6 MG TABS tablet, Take 1 tablet (8.6 mg total) by mouth 2 (two) times daily., Disp: 120 tablet, Rfl: 0   sodium zirconium cyclosilicate (LOKELMA) 10 g PACK packet, Take 10 g by mouth 2 (two) times daily., Disp: 2 packet, Rfl: 0   timolol (TIMOPTIC) 0.5 % ophthalmic solution, Place 1 drop into the left eye every morning., Disp: , Rfl:    TRELEGY ELLIPTA 100-62.5-25 MCG/ACT AEPB, Inhale 1 puff into the lungs daily., Disp: 60 each, Rfl: 6   VENTOLIN HFA 108 (90 Base) MCG/ACT inhaler, INHALE 2 PUFFS EVERY 6 HOURS AS NEEDED FOR WHEEZING OR SHORTNESS OF BREATH, Disp: 18 g, Rfl: 1 Social History   Socioeconomic History   Marital status: Married    Spouse name: [name]   Number of children: 3   Years of education: 12+   Highest education level: Bachelor's degree (e.g., BA, AB, BS)  Occupational History   Occupation: Retired    Comment: Programmer, systems  Tobacco Use   Smoking status: Former    Current packs/day: 0.00    Average packs/day: 1 pack/day for 30.0 years (30.0 ttl pk-yrs)    Types: Cigarettes    Start date: 05/28/1976    Quit date: 05/28/2006    Years since quitting: 16.9   Smokeless tobacco: Never  Vaping Use   Vaping status: Never Used  Substance and Sexual Activity   Alcohol use: Not Currently    Alcohol/week: 0.0 standard drinks of alcohol   Drug use: No   Sexual activity: Yes    Birth control/protection: None  Other Topics Concern   Not on file  Social History  Narrative   Patient lives at home with [name].    Patient has 3 children.    Patient has his BS   Patient is retired.    Drinks about 2 cups of coffee per day.      [facility]:   He is still married. He previously worked as a Programmer, systems. No significant dust exposure. He mostly worked with synthetic fibers. He is from [place].       Social Drivers of Health   Financial Resource Strain: Low Risk  (02/09/2020)   Overall Financial Resource Strain (CARDIA)    Difficulty of  Paying Living Expenses: Not hard at all  Food Insecurity: Low Risk  (05/03/2023)   Received from [facility]   Hunger Vital Sign    Worried About Running Out of Food in the Last Year: Never true    Ran Out of Food in the Last Year: Never true  Transportation Needs: No Transportation Needs (05/03/2023)   Received from [facility]    In the past 12 months, has lack of reliable transportation kept you from medical appointments, meetings, work or from getting things needed for daily living? : No  Physical Activity: Insufficiently Active (05/19/2021)   Exercise Vital Sign    Days of Exercise per Week: 1 day    Minutes of Exercise per Session: 10 min  Stress: Stress Concern Present (07/17/2021)   [employer] of Occupational Health - Occupational Stress Questionnaire    Feeling of Stress : To some extent  Social Connections: Socially Integrated (06/09/2019)   Social Connection and Isolation Panel [NHANES]    Frequency of Communication with Friends and Family: More than three times a week    Frequency of Social Gatherings with Friends and Family: More than three times a week    Attends Religious Services: More than 4 times per year    Active Member of [employer] or Organizations: Yes    Attends Engineer, structural: More than 4 times per year    Marital Status: Married  Catering manager Violence: Not At Risk (12/17/2022)   Humiliation, Afraid, Rape, and Kick questionnaire     Fear of Current or Ex-Partner: No    Emotionally Abused: No    Physically Abused: No    Sexually Abused: No   Family History  Problem Relation Age of Onset   Heart disease Mother        Valve replacement and pacemaker   Colon cancer Father    Asthma Other        cousin   Healthy Daughter    Healthy Daughter    Healthy Daughter     Objective: Office vital signs reviewed. BP 105/62   Pulse 75   Temp 98.3 F (36.8 C)   Ht 5\' 10"  (1.778 m)   Wt 158 lb (71.7 kg)   SpO2 100%   BMI 22.67 kg/m   Physical Examination:  General: Awake, alert, thin elderly male, No acute distress HEENT: Sclera white.  Moist mucous membranes Cardio: regular rate and rhythm, S1S2 heard Pulm: clear to auscultation bilaterally, no wheezes, rhonchi or rales; normal work of breathing on room air MSK: Arrives using rolling walker today.  Extremities with 1+ pitting edema to mid shins bilaterally Skin : Flesh-colored, hyperkeratotic lesion noted on the tip of the right nose  Cryotherapy Procedure:  Risks and benefits of procedure were reviewed with the patient.  Written consent obtained and scanned into the chart.  Lesion of concern was identified and located on right tip nose.  Liquid nitrogen was applied to area of concern and extending out 1 millimeters beyond the border of the lesion.  Treated area was allowed to come back to room temperature before treating it a second time.  Patient tolerated procedure well and there were no immediate complications.  Home care instructions were reviewed with the patient and a handout was provided.   Assessment/ Plan: 89 y.o. male   Spinal stenosis of lumbar region with neurogenic claudication - Plan: gabapentin (NEURONTIN) 300 MG capsule  Physical deconditioning  Stage 3b chronic kidney disease (HCC)  Bilateral  lower extremity edema - Plan: furosemide (LASIX) 20 MG tablet  Actinic keratosis  I have increased his gabapentin Rx to reflect current dosing  schedule.  Reinforced need for physical activity to reduce generalized weakness, improve lower extremity swelling and constipation.  Nasal AK treated with cryoablation.  Patient tolerated procedure without difficulty.  Home care instructions reviewed and reasons for reevaluation discussed   [name], DO [facility] [phone]

## 2023-05-19 ENCOUNTER — Emergency Department (HOSPITAL_BASED_OUTPATIENT_CLINIC_OR_DEPARTMENT_OTHER): Payer: Medicare Other

## 2023-05-19 ENCOUNTER — Encounter (HOSPITAL_BASED_OUTPATIENT_CLINIC_OR_DEPARTMENT_OTHER): Payer: Self-pay

## 2023-05-19 ENCOUNTER — Emergency Department (HOSPITAL_BASED_OUTPATIENT_CLINIC_OR_DEPARTMENT_OTHER)
Admission: EM | Admit: 2023-05-19 | Discharge: 2023-05-19 | Disposition: A | Discharge status: Home / Self Care | Payer: Medicare Other | Attending: Emergency Medicine | Admitting: Emergency Medicine

## 2023-05-19 DIAGNOSIS — N189 Chronic kidney disease, unspecified: Secondary | ICD-10-CM | POA: Diagnosis not present

## 2023-05-19 DIAGNOSIS — J449 Chronic obstructive pulmonary disease, unspecified: Secondary | ICD-10-CM | POA: Diagnosis not present

## 2023-05-19 DIAGNOSIS — N201 Calculus of ureter: Secondary | ICD-10-CM | POA: Diagnosis not present

## 2023-05-19 DIAGNOSIS — Z7901 Long term (current) use of anticoagulants: Secondary | ICD-10-CM | POA: Diagnosis not present

## 2023-05-19 DIAGNOSIS — N400 Enlarged prostate without lower urinary tract symptoms: Secondary | ICD-10-CM | POA: Diagnosis not present

## 2023-05-19 DIAGNOSIS — N200 Calculus of kidney: Secondary | ICD-10-CM | POA: Insufficient documentation

## 2023-05-19 DIAGNOSIS — R109 Unspecified abdominal pain: Secondary | ICD-10-CM | POA: Diagnosis not present

## 2023-05-19 LAB — COMPREHENSIVE METABOLIC PANEL
ALT: 11 U/L (ref 0–44)
AST: 14 U/L — ABNORMAL LOW (ref 15–41)
Albumin: 3.6 g/dL (ref 3.5–5.0)
Alkaline Phosphatase: 95 U/L (ref 38–126)
Anion gap: 7 (ref 5–15)
BUN: 17 mg/dL (ref 8–23)
CO2: 24 mmol/L (ref 22–32)
Calcium: 9.2 mg/dL (ref 8.9–10.3)
Chloride: 107 mmol/L (ref 98–111)
Creatinine, Ser: 1.48 mg/dL — ABNORMAL HIGH (ref 0.61–1.24)
GFR, Estimated: 43 mL/min — ABNORMAL LOW (ref >60)
Glucose, Bld: 97 mg/dL (ref 70–99)
Potassium: 4.2 mmol/L (ref 3.5–5.1)
Sodium: 138 mmol/L (ref 135–145)
Total Bilirubin: 0.5 mg/dL (ref <1.2)
Total Protein: 6.7 g/dL (ref 6.5–8.1)

## 2023-05-19 LAB — CBC WITH DIFFERENTIAL/PLATELET
Abs Immature Granulocytes: 0.02 10*3/uL (ref 0.00–0.07)
Basophils Absolute: 0 10*3/uL (ref 0.0–0.1)
Basophils Relative: 0 %
Eosinophils Absolute: 0.3 10*3/uL (ref 0.0–0.5)
Eosinophils Relative: 4 %
HCT: 36.9 % — ABNORMAL LOW (ref 39.0–52.0)
Hemoglobin: 11.8 g/dL — ABNORMAL LOW (ref 13.0–17.0)
Immature Granulocytes: 0 %
Lymphocytes Relative: 27 %
Lymphs Abs: 2.1 10*3/uL (ref 0.7–4.0)
MCH: 30.6 pg (ref 26.0–34.0)
MCHC: 32 g/dL (ref 30.0–36.0)
MCV: 95.6 fL (ref 80.0–100.0)
Monocytes Absolute: 0.6 10*3/uL (ref 0.1–1.0)
Monocytes Relative: 8 %
Neutro Abs: 4.8 10*3/uL (ref 1.7–7.7)
Neutrophils Relative %: 61 %
Platelets: 269 10*3/uL (ref 150–400)
RBC: 3.86 MIL/uL — ABNORMAL LOW (ref 4.22–5.81)
RDW: 14.8 % (ref 11.5–15.5)
WBC: 7.8 10*3/uL (ref 4.0–10.5)
nRBC: 0 % (ref 0.0–0.2)

## 2023-05-19 LAB — URINALYSIS, ROUTINE W REFLEX MICROSCOPIC
Bilirubin Urine: NEGATIVE
Glucose, UA: NEGATIVE mg/dL
Hgb urine dipstick: NEGATIVE
Ketones, ur: NEGATIVE mg/dL
Nitrite: POSITIVE — AB
Protein, ur: NEGATIVE mg/dL
Specific Gravity, Urine: 1.012 (ref 1.005–1.030)
WBC, UA: >50 WBC/hpf (ref 0–5)
pH: 6 (ref 5.0–8.0)

## 2023-05-19 MED ORDER — CEPHALEXIN 250 MG PO CAPS
500.0000 mg | ORAL_CAPSULE | Freq: Once | ORAL | Status: AC
  Administered 2023-05-19: 500 mg via ORAL
  Filled 2023-05-19: qty 2

## 2023-05-19 MED ORDER — ONDANSETRON 4 MG PO TBDP: 4.0000 mg | ORAL_TABLET | Freq: Three times a day (TID) | ORAL | 0 refills | Status: AC | PRN

## 2023-05-19 MED ORDER — CEPHALEXIN 500 MG PO CAPS: 500.0000 mg | ORAL_CAPSULE | Freq: Two times a day (BID) | ORAL | 0 refills | Status: AC

## 2023-05-19 NOTE — ED Triage Notes (Signed)
He noted sudden onset of right flank pain yesterday at 1900 hours which persists, "but it's a little better".

## 2023-05-19 NOTE — Discharge Instructions (Signed)
You have a right-sided kidney stone.  As we discussed if you develop fever please return for evaluation.  Take antibiotics as prescribed.  Follow-up with urologist.  Call them tomorrow.  I have prescribed you antibiotics and Zofran as needed for nausea.  Recommend 1000 mg of Tylenol every 6 hours as needed for pain.

## 2023-05-19 NOTE — ED Provider Notes (Addendum)
[facility] Provider Note   CSN: [id number] Arrival date & time: 05/19/23  2952     History  Chief Complaint  Patient presents with   Flank Pain    [facility] is a 89 y.o. male.  Patient here with right flank pain started overnight.  Pain improved.  Has chronic Foley catheter.  Denies any fever or chills.  Patient is on Eliquis.  History of COPD, IBS, kidney stones, A-fib, CKD.  Denies any nausea vomiting diarrhea otherwise.  The history is provided by the patient.       Home Medications Prior to Admission medications   Medication Sig Start Date End Date Taking? Authorizing Provider  cephALEXin (KEFLEX) 500 MG capsule Take 1 capsule (500 mg total) by mouth 2 (two) times daily for 5 days. 05/19/23 05/24/23 Yes Janan Bogie, DO  ondansetron (ZOFRAN-ODT) 4 MG disintegrating tablet Take 1 tablet (4 mg total) by mouth every 8 (eight) hours as needed. 05/19/23  Yes Hughey Rittenberry, DO  acetaminophen (TYLENOL) 500 MG tablet Take 1,000 mg by mouth daily as needed for headache or moderate pain.    [provider]  apixaban (ELIQUIS) 2.5 MG TABS tablet Take 1 tablet (2.5 mg total) by mouth 2 (two) times daily. 05/14/23   [name], DO  atorvastatin (LIPITOR) 40 MG tablet TAKE ONE TABLET ONCE DAILY 04/23/23   Delynn Flavin M, DO  azelastine (ASTELIN) 0.1 % nasal spray PLACE 1 SPRAY IN EACH NOSTRIL ONCE A DAY AS DIRECTED Patient taking differently: Place 1 spray into both nostrils at bedtime. 11/27/22   [name], DO  cholecalciferol (CHOLECALCIFEROL) 25 MCG tablet Take 1 tablet (1,000 Units total) by mouth every evening. 12/18/22   Azucena Fallen, MD  CRANBERRY PO Take 1 tablet by mouth every evening.    [provider]  Cyanocobalamin (VITAMIN B-12 PO) Take 1 tablet by mouth every evening.    [provider]  ferrous sulfate 325 (65 FE) MG EC tablet Take 325 mg by mouth daily.     [provider]  finasteride (PROSCAR) 5 MG tablet Take 5 mg by mouth daily.    [provider]  fluticasone (FLONASE) 50 MCG/ACT nasal spray USE 2 SPRAYS IN EACH NOSTRIL ONCE DAILY 02/13/23   Delynn Flavin M, DO  furosemide (LASIX) 20 MG tablet Take 20 mg by mouth every other day.    [provider]  gabapentin (NEURONTIN) 300 MG capsule Take 1 capsule (300 mg total) by mouth 2 (two) times daily. 05/17/23   [name], DO  latanoprost (XALATAN) 0.005 % ophthalmic solution Place 1 drop into both eyes at bedtime. 12/18/22   Azucena Fallen, MD  lubiprostone (AMITIZA) 24 MCG capsule Take 1 capsule (24 mcg total) by mouth 2 (two) times daily with a meal. For constipation Patient taking differently: Take 24 mcg by mouth daily with breakfast. For constipation 12/11/22   Delynn Flavin M, DO  mirtazapine (REMERON) 7.5 MG tablet Take 1 tablet (7.5 mg total) by mouth at bedtime. 12/31/22   [name], DO  polycarbophil (FIBERCON) 625 MG tablet Take 1 tablet (625 mg total) by mouth daily. 01/17/23   Azucena Fallen, MD  senna (SENOKOT) 8.6 MG TABS tablet Take 1 tablet (8.6 mg total) by mouth 2 (two) times daily. 01/16/23   Azucena Fallen, MD  sodium zirconium cyclosilicate (LOKELMA) 10 g PACK packet Take 10 g by mouth 2 (two) times daily. 01/08/23  Vanetta Mulders, MD  timolol (TIMOPTIC) 0.5 % ophthalmic solution Place 1 drop into the left eye every morning. 11/24/18   [provider]  TRELEGY ELLIPTA 100-62.5-25 MCG/ACT AEPB Inhale 1 puff into the lungs daily. 04/23/23     VENTOLIN HFA 108 (90 Base) MCG/ACT inhaler INHALE 2 PUFFS EVERY 6 HOURS AS NEEDED FOR WHEEZING OR SHORTNESS OF BREATH 12/12/22   Delynn Flavin M, DO      Allergies    Demerol [meperidine] and Spiriva handihaler [tiotropium bromide monohydrate]    Review of Systems   Review of Systems  Physical Exam Updated Vital Signs BP 128/70 (BP Location: Left Arm)    Pulse 72   Temp 98.1 F (36.7 C) (Oral)   Resp 15   SpO2 97%  Physical Exam Vitals and nursing note reviewed.  Constitutional:      General: He is not in acute distress.    Appearance: He is well-developed. He is not ill-appearing.  HENT:     Head: Normocephalic and atraumatic.     Nose: Nose normal.     Mouth/Throat:     Mouth: Mucous membranes are moist.  Eyes:     Extraocular Movements: Extraocular movements intact.     Conjunctiva/sclera: Conjunctivae normal.     Pupils: Pupils are equal, round, and reactive to light.  Cardiovascular:     Rate and Rhythm: Normal rate and regular rhythm.     Pulses: Normal pulses.     Heart sounds: Normal heart sounds. No murmur heard. Pulmonary:     Effort: Pulmonary effort is normal. No respiratory distress.     Breath sounds: Normal breath sounds.  Abdominal:     Palpations: Abdomen is soft.     Tenderness: There is no abdominal tenderness. There is right CVA tenderness.  Musculoskeletal:        General: No swelling.     Cervical back: Normal range of motion and neck supple.  Skin:    General: Skin is warm and dry.     Capillary Refill: Capillary refill takes less than 2 seconds.  Neurological:     General: No focal deficit present.     Mental Status: He is alert.  Psychiatric:        Mood and Affect: Mood normal.     ED Results / Procedures / Treatments   Labs (all labs ordered are listed, but only abnormal results are displayed) Labs Reviewed  CBC WITH DIFFERENTIAL/PLATELET - Abnormal; Notable for the following components:      Result Value   RBC 3.86 (*)    Hemoglobin 11.8 (*)    HCT 36.9 (*)    All other components within normal limits  COMPREHENSIVE METABOLIC PANEL - Abnormal; Notable for the following components:   Creatinine, Ser 1.48 (*)    AST 14 (*)    GFR, Estimated 43 (*)    All other components within normal limits  URINALYSIS, ROUTINE W REFLEX MICROSCOPIC - Abnormal; Notable for the following components:    Nitrite POSITIVE (*)    Leukocytes,Ua LARGE (*)    Bacteria, UA RARE (*)    All other components within normal limits  URINE CULTURE    EKG [facility]  Radiology CT Renal Stone Study Result Date: 05/19/2023 CLINICAL DATA:  Abdominal/flank pain with stone suspected EXAM: CT ABDOMEN AND PELVIS WITHOUT CONTRAST TECHNIQUE: Multidetector CT imaging of the abdomen and pelvis was performed following the standard protocol without IV contrast. RADIATION DOSE REDUCTION: This exam was performed according to the  departmental dose-optimization program which includes automated exposure control, adjustment of the mA and/or kV according to patient size and/or use of iterative reconstruction technique. COMPARISON:  01/08/2023 FINDINGS: Lower chest: Atheromatous calcification of the aorta and coronaries. Mild scarring in the right lower lobe. Hepatobiliary: No focal liver abnormality.No evidence of biliary obstruction or stone. Pancreas: Unremarkable. Spleen: Granulomatous calcification in the spleen. Adrenals/Urinary Tract: Negative adrenals. 6 mm stone at the right UVJ. No hydronephrosis. Symmetric renal atrophy. Stable non worrisome cyst in the interpolar left kidney. 2 punctate left renal calculi. Catheterized bladder which is completely collapsed. Stomach/Bowel: No obstruction. No visible bowel inflammation. 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 Vascular/Lymphatic: No acute vascular abnormality. Advanced atheromatous calcification of the aorta and iliacs with collapsed appearance of bilateral iliacs. No mass or adenopathy. Reproductive:Marked enlargement of the prostate, up lifting the bladder and measuring 7.1 cm in diameter. Other: No ascites or pneumoperitoneum. Musculoskeletal: No acute abnormalities. Advanced lumbar spine degeneration with scoliosis. Subjective osteopenia. IMPRESSION: 1. 6 mm stone near the right UVJ.  No hydronephrosis. 2. Punctate left renal  calculi. 3. Complete decompression of the catheterized bladder. Chronic prostate enlargement. Electronically Signed   By: Tiburcio Pea M.D.   On: 05/19/2023 10:10    Procedures Procedures    Medications Ordered in ED Medications  cephALEXin (KEFLEX) capsule 500 mg (has no administration in time range)    ED Course/ Medical Decision Making/ A&P                                 Medical Decision Making Amount and/or Complexity of Data Reviewed Labs: ordered. Radiology: ordered.  Risk Prescription drug management.   [facility] is here with right flank pain.  History of A-fib on Eliquis.  History of kidney stones.  History of CKD.  Chronic indwelling Foley catheter.  Normal vitals.  No fever.  Had severe pain overnight now resolved.  Got good distal pulses in his legs.  No abdominal tenderness.  Differential diagnosis UTI versus kidney stone.  Will check CBC BMP urinalysis CT scan abdomen pelvis.  Will send urine culture given that he has a chronic Foley catheter likely will be hard to interpret urinalysis.  Per my review and interpretation of labs he has no significant leukocytosis.  Creatinine at baseline.  Urinalysis somewhat equivocal for infection.  Will send urine culture.  He does have a 6 mm right-sided kidney stone with mild hydronephrosis.  However he has no fever or white count.  I talked with Dr. Liliane Shi with urology.  Patient is pain-free.  Overall will be conservative and started on some antibiotics but will send urine culture.  At this time given that he has no fever or white count we do not think there is any need for acute intervention on the kidney stone now.  He can follow-up with urology this week.  Patient understands strict return precautions including worsening pain, fever as he would likely need intervention on the stone.  At this time he is pain-free.  He has declined narcotic pain medicine for breakthrough pain at home.  Will prescribe Zofran to take as needed.   Recommend Tylenol for pain as well.  Discharged in good condition.  Understands return precautions.  This chart was dictated using voice recognition software.  Despite best efforts to proofread,  errors can occur which can change the documentation meaning.  Final Clinical Impression(s) / ED Diagnoses Final diagnoses:  Kidney stone    Rx / DC Orders ED Discharge Orders          Ordered    cephALEXin (KEFLEX) 500 MG capsule  2 times daily        05/19/23 1034    ondansetron (ZOFRAN-ODT) 4 MG disintegrating tablet  Every 8 hours PRN        05/19/23 1035              [facility] Juhasz, DO 05/19/23 [id number]    [facility] [place], DO 05/19/23 1037

## 2023-05-20 NOTE — Addendum Note (Signed)
Addended by: [name] on: 05/20/2023 12:16 PM   Modules accepted: Level of Service

## 2023-05-22 LAB — URINE CULTURE: Culture: >=100000 — AB

## 2023-05-23 ENCOUNTER — Telehealth (HOSPITAL_BASED_OUTPATIENT_CLINIC_OR_DEPARTMENT_OTHER): Payer: Self-pay

## 2023-05-23 NOTE — Progress Notes (Signed)
ED Antimicrobial Stewardship Positive Culture Follow Up   [facility] is an 89 y.o. male who presented to [facility] with a chief complaint of  Chief Complaint  Patient presents with   Flank Pain    Recent Results (from the past 720 hours)  Urine Culture     Status: Abnormal   Collection Time: 05/19/23  9:35 AM   Specimen: Urine, Clean Catch  Result Value Ref Range Status   Specimen Description   Final    URINE, CLEAN CATCH Performed at [facility], [address] [place], [place] [address]    Special Requests   Final    NONE Performed at [facility], [address] [place], [place] [address]    Culture (A)  Final    >=100,000 COLONIES/mL KLEBSIELLA OXYTOCA 80,000 COLONIES/mL PROVIDENCIA STUARTII    Report Status 05/22/2023 FINAL  Final   Organism ID, Bacteria KLEBSIELLA OXYTOCA (A)  Final   Organism ID, Bacteria PROVIDENCIA STUARTII (A)  Final      Susceptibility   Klebsiella oxytoca - MIC*    AMPICILLIN >=32 RESISTANT Resistant     CEFEPIME <=0.12 SENSITIVE Sensitive     CEFTRIAXONE <=0.25 SENSITIVE Sensitive     CIPROFLOXACIN <=0.25 SENSITIVE Sensitive     GENTAMICIN <=1 SENSITIVE Sensitive     IMIPENEM <=0.25 SENSITIVE Sensitive     NITROFURANTOIN 32 SENSITIVE Sensitive     TRIMETH/SULFA <=20 SENSITIVE Sensitive     AMPICILLIN/SULBACTAM 8 SENSITIVE Sensitive     PIP/TAZO <=4 SENSITIVE Sensitive ug/mL    * >=100,000 COLONIES/mL KLEBSIELLA OXYTOCA   Providencia stuartii - MIC*    AMPICILLIN RESISTANT Resistant     CEFEPIME <=0.12 SENSITIVE Sensitive     CEFTRIAXONE <=0.25 SENSITIVE Sensitive     CIPROFLOXACIN <=0.25 SENSITIVE Sensitive     GENTAMICIN RESISTANT Resistant     IMIPENEM 1 SENSITIVE Sensitive     NITROFURANTOIN 256 RESISTANT Resistant     TRIMETH/SULFA <=20 SENSITIVE Sensitive     AMPICILLIN/SULBACTAM 8 SENSITIVE Sensitive     PIP/TAZO <=4 SENSITIVE Sensitive ug/mL    * 80,000 COLONIES/mL PROVIDENCIA  STUARTII    Treated with Keflex, organism resistant to prescribed antimicrobial  New antibiotic prescription: Bactrim 1 DS PO BID X 7days.   ED Provider: [name], MD   Estill Batten, PharmD, BCCCP  05/23/2023, 10:05 AM Clinical Pharmacist Monday - Friday phone -  [phone] Saturday - Sunday phone - [phone]

## 2023-05-23 NOTE — Telephone Encounter (Signed)
Post ED Visit - Positive Culture Follow-up: Unsuccessful Patient Follow-up  Culture assessed and recommendations reviewed by:  []  Enzo Bi, Pharm.D. []  Celedonio Miyamoto, Pharm.D., BCPS AQ-ID []  Garvin Fila, Pharm.D., BCPS []  Georgina Pillion, Pharm.D., BCPS []  [place], [address].D., BCPS, AAHIVP []  Estella Husk, Pharm.D., BCPS, AAHIVP []  Sherlynn Carbon, PharmD []  Pollyann Samples, PharmD, BCPS  Positive urine culture  []  Patient discharged without antimicrobial prescription and treatment is now indicated [x]  Organism is resistant to prescribed ED discharge antimicrobial []  Patient with positive blood cultures  Plan: start Bactrim DS 1 tab po BID x 7 days per ED provider [name], MD   Unable to contact patient after 3 attempts, letter will be sent to address on file  Sandria Senter 05/23/2023, 12:04 PM

## 2023-05-31 ENCOUNTER — Other Ambulatory Visit (HOSPITAL_BASED_OUTPATIENT_CLINIC_OR_DEPARTMENT_OTHER): Payer: Self-pay

## 2023-06-03 ENCOUNTER — Telehealth (HOSPITAL_BASED_OUTPATIENT_CLINIC_OR_DEPARTMENT_OTHER): Payer: Self-pay | Admitting: *Deleted

## 2023-06-04 ENCOUNTER — Encounter: Payer: Self-pay | Admitting: Family Medicine

## 2023-06-04 DIAGNOSIS — M48062 Spinal stenosis, lumbar region with neurogenic claudication: Secondary | ICD-10-CM | POA: Diagnosis not present

## 2023-06-05 MED ORDER — GABAPENTIN 300 MG PO CAPS: 300.0000 mg | ORAL_CAPSULE | Freq: Three times a day (TID) | ORAL | 1 refills | Status: DC

## 2023-06-10 DIAGNOSIS — M48062 Spinal stenosis, lumbar region with neurogenic claudication: Secondary | ICD-10-CM | POA: Diagnosis not present

## 2023-06-17 DIAGNOSIS — R338 Other retention of urine: Secondary | ICD-10-CM | POA: Diagnosis not present

## 2023-06-20 DIAGNOSIS — R1031 Right lower quadrant pain: Secondary | ICD-10-CM | POA: Diagnosis not present

## 2023-06-26 ENCOUNTER — Other Ambulatory Visit (HOSPITAL_BASED_OUTPATIENT_CLINIC_OR_DEPARTMENT_OTHER): Payer: Self-pay

## 2023-07-02 DIAGNOSIS — M48062 Spinal stenosis, lumbar region with neurogenic claudication: Secondary | ICD-10-CM | POA: Diagnosis not present

## 2023-07-09 ENCOUNTER — Telehealth (INDEPENDENT_AMBULATORY_CARE_PROVIDER_SITE_OTHER): Payer: Medicare Other | Admitting: Family

## 2023-07-09 ENCOUNTER — Encounter: Payer: Self-pay | Admitting: Family

## 2023-07-09 ENCOUNTER — Other Ambulatory Visit: Payer: Medicare Other

## 2023-07-09 DIAGNOSIS — J441 Chronic obstructive pulmonary disease with (acute) exacerbation: Secondary | ICD-10-CM

## 2023-07-09 DIAGNOSIS — B9689 Other specified bacterial agents as the cause of diseases classified elsewhere: Secondary | ICD-10-CM | POA: Diagnosis not present

## 2023-07-09 DIAGNOSIS — J208 Acute bronchitis due to other specified organisms: Secondary | ICD-10-CM

## 2023-07-09 DIAGNOSIS — D631 Anemia in chronic kidney disease: Secondary | ICD-10-CM | POA: Diagnosis not present

## 2023-07-09 DIAGNOSIS — R809 Proteinuria, unspecified: Secondary | ICD-10-CM | POA: Diagnosis not present

## 2023-07-09 DIAGNOSIS — N189 Chronic kidney disease, unspecified: Secondary | ICD-10-CM | POA: Diagnosis not present

## 2023-07-09 MED ORDER — DOXYCYCLINE HYCLATE 100 MG PO TABS: 100.0000 mg | ORAL_TABLET | Freq: Two times a day (BID) | ORAL | 0 refills | Status: AC

## 2023-07-09 NOTE — Progress Notes (Signed)
Virtual Visit Consent   Charles Golden, you are scheduled for a virtual visit with a [facility] provider today. Just as with appointments in the office, your consent must be obtained to participate. Your consent will be active for this visit and any virtual visit you may have with one of our providers in the next 365 days. If you have a MyChart account, a copy of this consent can be sent to you electronically.  As this is a virtual visit, video technology does not allow for your provider to perform a traditional examination. This may limit your provider's ability to fully assess your condition. If your provider identifies any concerns that need to be evaluated in person or the need to arrange testing (such as labs, EKG, etc.), we will make arrangements to do so. Although advances in technology are sophisticated, we cannot ensure that it will always work on either your end or our end. If the connection with a video visit is poor, the visit may have to be switched to a telephone visit. With either a video or telephone visit, we are not always able to ensure that we have a secure connection.  By engaging in this virtual visit, you consent to the provision of healthcare and authorize for your insurance to be billed (if applicable) for the services provided during this visit. Depending on your insurance coverage, you may receive a charge related to this service.  I need to obtain your verbal consent now. Are you willing to proceed with your visit today? LELAND RAVER has provided verbal consent on 07/09/2023 for a virtual visit (video or telephone). Jannifer Rodney, FNP  Date: 07/09/2023 10:48 AM   Virtual Visit via Video Note   I, Jannifer Rodney, connected with  Charles Golden  ([id number], 1925/09/23) on 07/09/23 at 10:55 AM EST by a video-enabled telemedicine application and verified that I am speaking with the correct person using two identifiers.  Location: Patient: Virtual Visit Location Patient:  Home Provider: Virtual Visit Location Provider: Home Office   I discussed the limitations of evaluation and management by telemedicine and the availability of in person appointments. The patient expressed understanding and agreed to proceed.    History of Present Illness: Charles Golden is a 89 y.o. who identifies as a male who was assigned male at birth, and is being seen today for cough.  HPI: Cough This is a new problem. The current episode started 1 to 4 weeks ago. The problem has been gradually worsening. The problem occurs every few minutes. The cough is Productive of sputum and productive of purulent sputum. Associated symptoms include myalgias, nasal congestion, postnasal drip and shortness of breath. Pertinent negatives include no chills, ear congestion, ear pain, fever, headaches or wheezing. He has tried OTC cough suppressant and rest for the symptoms. The treatment provided mild relief. His past medical history is significant for COPD.    Problems:  Patient Active Problem List   Diagnosis Date Noted   Spinal stenosis of lumbar region 02/22/2023   Iron deficiency anemia secondary to inadequate dietary iron intake 02/05/2023   Acute kidney injury superimposed on chronic kidney disease (HCC) 01/11/2023   Complicated urinary tract infection 01/11/2023   Heart failure with preserved ejection fraction (HCC) 01/11/2023   History of CVA (cerebrovascular accident) 01/11/2023   Hyperlipidemia 01/11/2023   Normocytic anemia 01/11/2023   AKI (acute kidney injury) (HCC) 12/17/2022   Pain in joint of left knee 12/05/2022   Atopic dermatitis 07/02/2022  Paroxysmal atrial fibrillation (HCC) 05/10/2021   Cerebrovascular accident (CVA) due to vascular stenosis (HCC) 05/01/2021   Stenosis of left carotid artery    Low back pain 12/20/2020   Knee pain, bilateral 12/20/2020   Chronic tension-type headache, not intractable 12/13/2020   Bilateral sensorineural hearing loss 09/25/2019    Bronchiectasis with acute exacerbation (HCC) 05/27/2019   Vitamin D deficiency 01/21/2019   Debility 01/21/2019   Recurrent right inguinal hernia    Presbycusis of both ears 07/19/2017   Restless leg syndrome 11/25/2016   Collagenous colitis 07/24/2016   Deviated nasal septum 09/03/2015   Basal cell carcinoma of nose 09/03/2015   Thoracic aortic atherosclerosis (HCC) 05/03/2015   GERD without esophagitis 04/11/2015   COPD GOLD I  12/08/2013   Small bowel volvulus (HCC) 08/05/2013   Left inguinal hernia 08/05/2013   Obstructive chronic bronchitis without exacerbation (HCC) 04/30/2013   Pain of left hip joint 04/29/2013   BPH (benign prostatic hyperplasia) 01/05/2013   IBS (irritable bowel syndrome) 09/03/2012   Chronic rhinosinusitis 09/03/2012   Lung nodules 05/04/2003    Allergies:  Allergies  Allergen Reactions   Demerol [Meperidine] Other (See Comments)    Pt. States "he woke up during a colonoscopy"   Spiriva Handihaler [Tiotropium Bromide Monohydrate] Other (See Comments)    Mild Urinary Retention   Medications:  Current Outpatient Medications:    doxycycline (VIBRA-TABS) 100 MG tablet, Take 1 tablet (100 mg total) by mouth 2 (two) times daily., Disp: 20 tablet, Rfl: 0   acetaminophen (TYLENOL) 500 MG tablet, Take 1,000 mg by mouth daily as needed for headache or moderate pain., Disp: , Rfl:    apixaban (ELIQUIS) 2.5 MG TABS tablet, Take 1 tablet (2.5 mg total) by mouth 2 (two) times daily., Disp: 180 tablet, Rfl: 0   atorvastatin (LIPITOR) 40 MG tablet, TAKE ONE TABLET ONCE DAILY, Disp: 90 tablet, Rfl: 0   azelastine (ASTELIN) 0.1 % nasal spray, PLACE 1 SPRAY IN EACH NOSTRIL ONCE A DAY AS DIRECTED (Patient taking differently: Place 1 spray into both nostrils at bedtime.), Disp: 30 mL, Rfl: 5   cholecalciferol (CHOLECALCIFEROL) 25 MCG tablet, Take 1 tablet (1,000 Units total) by mouth every evening., Disp: 30 tablet, Rfl: 0   CRANBERRY PO, Take 1 tablet by mouth every  evening., Disp: , Rfl:    Cyanocobalamin (VITAMIN B-12 PO), Take 1 tablet by mouth every evening., Disp: , Rfl:    ferrous sulfate 325 (65 FE) MG EC tablet, Take 325 mg by mouth daily., Disp: , Rfl:    finasteride (PROSCAR) 5 MG tablet, Take 5 mg by mouth daily., Disp: , Rfl:    fluticasone (FLONASE) 50 MCG/ACT nasal spray, USE 2 SPRAYS IN EACH NOSTRIL ONCE DAILY, Disp: 16 g, Rfl: 5   furosemide (LASIX) 20 MG tablet, Take 20 mg by mouth every other day., Disp: , Rfl:    gabapentin (NEURONTIN) 300 MG capsule, Take 1 capsule (300 mg total) by mouth 3 (three) times daily., Disp: 270 capsule, Rfl: 1   latanoprost (XALATAN) 0.005 % ophthalmic solution, Place 1 drop into both eyes at bedtime., Disp: 2.5 mL, Rfl: 0   lubiprostone (AMITIZA) 24 MCG capsule, Take 1 capsule (24 mcg total) by mouth 2 (two) times daily with a meal. For constipation (Patient taking differently: Take 24 mcg by mouth daily with breakfast. For constipation), Disp: 180 capsule, Rfl: 3   mirtazapine (REMERON) 7.5 MG tablet, Take 1 tablet (7.5 mg total) by mouth at bedtime., Disp: 90 tablet, Rfl: 3  ondansetron (ZOFRAN-ODT) 4 MG disintegrating tablet, Take 1 tablet (4 mg total) by mouth every 8 (eight) hours as needed., Disp: 20 tablet, Rfl: 0   polycarbophil (FIBERCON) 625 MG tablet, Take 1 tablet (625 mg total) by mouth daily., Disp: 30 tablet, Rfl: 0   senna (SENOKOT) 8.6 MG TABS tablet, Take 1 tablet (8.6 mg total) by mouth 2 (two) times daily., Disp: 120 tablet, Rfl: 0   sodium zirconium cyclosilicate (LOKELMA) 10 g PACK packet, Take 10 g by mouth 2 (two) times daily., Disp: 2 packet, Rfl: 0   timolol (TIMOPTIC) 0.5 % ophthalmic solution, Place 1 drop into the left eye every morning., Disp: , Rfl:    TRELEGY ELLIPTA 100-62.5-25 MCG/ACT AEPB, Inhale 1 puff into the lungs daily., Disp: 60 each, Rfl: 6   VENTOLIN HFA 108 (90 Base) MCG/ACT inhaler, INHALE 2 PUFFS EVERY 6 HOURS AS NEEDED FOR WHEEZING OR SHORTNESS OF BREATH, Disp: 18 g,  Rfl: 1  Observations/Objective: Patient is well-developed, well-nourished in no acute distress.  Resting comfortably  at home.  Head is normocephalic, atraumatic.  No labored breathing.  Speech is clear and coherent with logical content.  Patient is alert and oriented at baseline.  Coarse nonproductive cough  Assessment and Plan: 1. Acute bacterial bronchitis (Primary) - doxycycline (VIBRA-TABS) 100 MG tablet; Take 1 tablet (100 mg total) by mouth 2 (two) times daily.  Dispense: 20 tablet; Refill: 0  2. COPD exacerbation (HCC) - doxycycline (VIBRA-TABS) 100 MG tablet; Take 1 tablet (100 mg total) by mouth 2 (two) times daily.  Dispense: 20 tablet; Refill: 0  - Take meds as prescribed - Use a cool mist humidifier  -Use saline nose sprays frequently -Force fluids -For any cough or congestion  Use plain Mucinex- regular strength or max strength is fine -For fever or aces or pains- take tylenol or ibuprofen. -Throat lozenges if help -Follow up if symptoms worsen or do not improve   Follow Up Instructions: I discussed the assessment and treatment plan with the patient. The patient was provided an opportunity to ask questions and all were answered. The patient agreed with the plan and demonstrated an understanding of the instructions.  A copy of instructions were sent to the patient via MyChart unless otherwise noted below.     The patient was advised to call back or seek an in-person evaluation if the symptoms worsen or if the condition fails to improve as anticipated.    Jannifer Rodney, FNP

## 2023-07-10 DIAGNOSIS — R1031 Right lower quadrant pain: Secondary | ICD-10-CM | POA: Diagnosis not present

## 2023-07-10 DIAGNOSIS — R197 Diarrhea, unspecified: Secondary | ICD-10-CM | POA: Diagnosis not present

## 2023-07-10 DIAGNOSIS — K5909 Other constipation: Secondary | ICD-10-CM | POA: Diagnosis not present

## 2023-07-15 ENCOUNTER — Other Ambulatory Visit (HOSPITAL_BASED_OUTPATIENT_CLINIC_OR_DEPARTMENT_OTHER): Payer: Self-pay

## 2023-07-15 DIAGNOSIS — R338 Other retention of urine: Secondary | ICD-10-CM | POA: Diagnosis not present

## 2023-07-17 ENCOUNTER — Other Ambulatory Visit: Payer: Self-pay | Admitting: Family Medicine

## 2023-07-17 DIAGNOSIS — Z8673 Personal history of transient ischemic attack (TIA), and cerebral infarction without residual deficits: Secondary | ICD-10-CM

## 2023-07-19 DIAGNOSIS — N138 Other obstructive and reflux uropathy: Secondary | ICD-10-CM | POA: Diagnosis not present

## 2023-07-19 DIAGNOSIS — N1832 Chronic kidney disease, stage 3b: Secondary | ICD-10-CM | POA: Diagnosis not present

## 2023-07-19 DIAGNOSIS — N401 Enlarged prostate with lower urinary tract symptoms: Secondary | ICD-10-CM | POA: Diagnosis not present

## 2023-07-19 DIAGNOSIS — D638 Anemia in other chronic diseases classified elsewhere: Secondary | ICD-10-CM | POA: Diagnosis not present

## 2023-07-26 ENCOUNTER — Other Ambulatory Visit (HOSPITAL_BASED_OUTPATIENT_CLINIC_OR_DEPARTMENT_OTHER): Payer: Self-pay

## 2023-07-26 MED ORDER — TRELEGY ELLIPTA 100-62.5-25 MCG/ACT IN AEPB
1.0000 | INHALATION_SPRAY | Freq: Every day | RESPIRATORY_TRACT | 6 refills | Status: DC
Start: 2023-07-26 — End: 2024-03-11
  Filled 2023-07-26: qty 60, 30d supply, fill #0
  Filled 2023-08-06 – 2023-08-21 (×2): qty 60, 30d supply, fill #1
  Filled 2023-10-09: qty 60, 30d supply, fill #2
  Filled 2023-11-02 (×2): qty 60, 30d supply, fill #3
  Filled 2023-12-04: qty 60, 30d supply, fill #4
  Filled 2024-01-03: qty 60, 30d supply, fill #5
  Filled 2024-02-01: qty 60, 30d supply, fill #6

## 2023-07-31 DIAGNOSIS — R338 Other retention of urine: Secondary | ICD-10-CM | POA: Diagnosis not present

## 2023-07-31 DIAGNOSIS — N401 Enlarged prostate with lower urinary tract symptoms: Secondary | ICD-10-CM | POA: Diagnosis not present

## 2023-08-06 ENCOUNTER — Other Ambulatory Visit (HOSPITAL_BASED_OUTPATIENT_CLINIC_OR_DEPARTMENT_OTHER): Payer: Self-pay

## 2023-08-06 ENCOUNTER — Other Ambulatory Visit: Payer: Self-pay | Admitting: Family Medicine

## 2023-08-06 DIAGNOSIS — I4811 Longstanding persistent atrial fibrillation: Secondary | ICD-10-CM

## 2023-08-06 MED ORDER — APIXABAN 2.5 MG PO TABS
2.5000 mg | ORAL_TABLET | Freq: Two times a day (BID) | ORAL | 0 refills | Status: DC
  Filled 2023-08-06: qty 180, 90d supply, fill #0

## 2023-08-12 DIAGNOSIS — J479 Bronchiectasis, uncomplicated: Secondary | ICD-10-CM | POA: Diagnosis not present

## 2023-08-12 DIAGNOSIS — J432 Centrilobular emphysema: Secondary | ICD-10-CM | POA: Diagnosis not present

## 2023-08-13 DIAGNOSIS — R338 Other retention of urine: Secondary | ICD-10-CM | POA: Diagnosis not present

## 2023-08-16 ENCOUNTER — Encounter: Payer: Self-pay | Admitting: Family Medicine

## 2023-08-16 ENCOUNTER — Ambulatory Visit: Payer: Medicare Other | Admitting: Family Medicine

## 2023-08-16 VITALS — BP 142/72 | HR 66 | Temp 98.5°F | Ht 70.0 in | Wt 159.0 lb

## 2023-08-16 DIAGNOSIS — M7061 Trochanteric bursitis, right hip: Secondary | ICD-10-CM | POA: Diagnosis not present

## 2023-08-16 DIAGNOSIS — J432 Centrilobular emphysema: Secondary | ICD-10-CM | POA: Diagnosis not present

## 2023-08-16 NOTE — Patient Instructions (Signed)
 Hip Bursitis  Hip bursitis is swelling of one or more fluid-filled sacs (bursae) in your hip joint. If the bursa becomes irritated, it can fill with extra fluid and become swollen. This condition can cause pain, and your symptoms may come and go over time. What are the causes? Repeated use of your hip muscles. Injury to the hip. Weak butt muscles. Bone spurs. Infection. In some cases, the cause may not be known. What increases the risk? Having a past hip injury or hip surgery. Having a condition, such as arthritis, gout, diabetes, or thyroid disease. Having spine problems. Having one leg that is shorter than the other. Running a lot or doing long-distance running. Playing sports where there is a risk of injury or falling, such as football, martial arts, or skiing. What are the signs or symptoms? Symptoms may come and go, and they often include: Pain in the hip or groin area. Pain may get worse when you move your hip. Tenderness and swelling of the hip. In rare cases, the bursa may become infected. If this happens, you may: Get a fever. Have warmth and redness in the hip area. How is this treated? This condition is treated by: Resting your hip. Icing your hip. Wrapping the hip area with an elastic bandage (compression wrap). Keeping the hip raised. Other treatments may include: Using crutches, a cane, or a walker. Medicines. Draining fluid out of the bursa. Surgery to take out a bursa. This is rare. Long-term treatment may include: Doing exercises to help your strength and flexibility. Lifestyle changes like losing weight to lessen the strain on your hip. Follow these instructions at home: Managing pain, stiffness, and swelling     If told, put ice on the painful area. To do this: Put ice in a plastic bag. Place a towel between your skin and the bag. Leave the ice on for 20 minutes, 2-3 times a day. Take off the ice if your skin turns bright red. This is very important.  If you cannot feel pain, heat, or cold, you have a greater risk of damage to the area. Raise your hip by putting a pillow under your hips while you lie down. Stop if you feel pain. If told, put heat on the affected area. Do this as often as told by your doctor. Use the heat source that your doctor recommends, such as a moist heat pack or a heating pad. Place a towel between your skin and the heat source. Leave the heat on for 20-30 minutes. Take off the heat if your skin turns bright red. This is very important. If you cannot feel pain, heat, or cold, you have a greater risk of getting burned. Activity Do not use your hip to support your body weight until your doctor says that you can. Use crutches, a cane, or a walker as told by your doctor. If the affected leg is one that you use to drive, ask your doctor if it is safe to drive. Rest and protect your hip as much as you can until you feel better. Return to your normal activities when your doctor says that it is safe. Do exercises as told by your doctor. General instructions Take over-the-counter and prescription medicines only as told by your doctor. Gently rub and stretch your injured area as often as is comfortable. Wear elastic bandages only as told by your doctor. If one of your legs is shorter than the other, get fitted for a shoe insert or orthotic. Keep a healthy  weight. Follow instructions from your doctor. Keep all follow-up visits. How is this prevented? Exercise regularly or as told by your doctor. Wear the right shoes for the sport you play and for daily activities. Warm up and stretch before being active. Cool down and stretch after being active. Take breaks often from repeated activity. Avoid activities that bother your hip or cause pain. Avoid sitting down for a long time. Where to find more information American Academy of Orthopaedic Surgeons: orthoinfo.aaos.org Contact a doctor if: You have a fever. You have new  symptoms. You have trouble walking or doing everyday activities. You have pain that gets worse or does not get better with medicine. The skin around your hip is red. You get a feeling of warmth in your hip area. Get help right away if: You cannot move your hip. You have very bad pain. You cannot control the muscles in your feet. Summary Hip bursitis is swelling of one or more fluid-filled sacs (bursae) in your hip joint. Symptoms often come and go over time. This condition is often treated by resting and icing the hip. It also may help to keep the area raised and wrapped in an elastic bandage. Other treatments may be needed. This information is not intended to replace advice given to you by your health care provider. Make sure you discuss any questions you have with your health care provider. Document Revised: 05/09/2021 Document Reviewed: 05/09/2021 Elsevier Patient Education  2024 [employer].

## 2023-08-16 NOTE — Progress Notes (Signed)
 Subjective: CC: low energy, congestion PCP: Charles Ip, DO GNF:AOZHY Charles Golden is a 89 y.o. male presenting to clinic today for:  1.  Congestion He complains of congestion in his chest.  He often coughs up a lot of phlegm.  This is his baseline.  He reports no chest pain or change in exercise tolerance.  He saw his pulmonologist recently who ordered him a vest so that he could start breaking up mucus but he has not gotten that yet.  He is using the flutter valve.  He is compliant with his inhaler.  He denies any hemoptysis.  He notes that the congestion seems to be worse in the morning time.  Sometimes he has difficulty getting the mucus out but has been taking Mucinex.  He admits he does not really drink a lot of water but in fact drinks Coca-Cola's instead.  He is accompanied today by his daughter who reiterates that yes he does not drink sufficient water throughout the day.  He reports that energy remains low and he wishes there was a pill that would just give him more energy.  He will be celebrating his 34 birthday in about 3 weeks  2.  Hip pain Reports a right sided hip pain it has been going on for months and months.  He points to the trochanteric bursa.  He says that this is tender to palpation and can wake him up from sleep some days.  Is not able to roll on that side in the bed.  ROS: Per HPI  Allergies  Allergen Reactions   Demerol [Meperidine] Other (See Comments)    Pt. States "he woke up during a colonoscopy"   Spiriva Handihaler [Tiotropium Bromide Monohydrate] Other (See Comments)    Mild Urinary Retention   Past Medical History:  Diagnosis Date   Acute medial meniscal tear 07/20/2014   Atrial fibrillation with RVR (HCC) 05/10/2021   Chronic headache 08/05/2013   COPD (chronic obstructive pulmonary disease) (HCC)    CVA (cerebral vascular accident) (HCC) 05/01/2021   Enlarged prostate    GERD (gastroesophageal reflux disease)    Glaucoma    Glucagonoma     Hernia, incisional    at present   [facility] (hard of hearing)    IBS (irritable bowel syndrome)    Incisional hernia, without obstruction or gangrene 08/05/2013   Sciatic pain    Stage 3b chronic kidney disease (CKD) (HCC)     Current Outpatient Medications:    acetaminophen (TYLENOL) 500 MG tablet, Take 1,000 mg by mouth daily as needed for headache or moderate pain., Disp: , Rfl:    apixaban (ELIQUIS) 2.5 MG TABS tablet, Take 1 tablet (2.5 mg total) by mouth 2 (two) times daily., Disp: 180 tablet, Rfl: 0   atorvastatin (LIPITOR) 40 MG tablet, TAKE ONE TABLET ONCE DAILY, Disp: 90 tablet, Rfl: 0   azelastine (ASTELIN) 0.1 % nasal spray, PLACE 1 SPRAY IN EACH NOSTRIL ONCE A DAY AS DIRECTED (Patient taking differently: Place 1 spray into both nostrils at bedtime.), Disp: 30 mL, Rfl: 5   cholecalciferol (CHOLECALCIFEROL) 25 MCG tablet, Take 1 tablet (1,000 Units total) by mouth every evening., Disp: 30 tablet, Rfl: 0   CRANBERRY PO, Take 1 tablet by mouth every evening., Disp: , Rfl:    Cyanocobalamin (VITAMIN B-12 PO), Take 1 tablet by mouth every evening., Disp: , Rfl:    doxycycline (VIBRA-TABS) 100 MG tablet, Take 1 tablet (100 mg total) by mouth 2 (two) times daily., Disp:  20 tablet, Rfl: 0   ferrous sulfate 325 (65 FE) MG EC tablet, Take 325 mg by mouth daily., Disp: , Rfl:    finasteride (PROSCAR) 5 MG tablet, Take 5 mg by mouth daily., Disp: , Rfl:    fluticasone (FLONASE) 50 MCG/ACT nasal spray, USE 2 SPRAYS IN EACH NOSTRIL ONCE DAILY, Disp: 16 g, Rfl: 5   furosemide (LASIX) 20 MG tablet, Take 20 mg by mouth every other day., Disp: , Rfl:    gabapentin (NEURONTIN) 300 MG capsule, Take 1 capsule (300 mg total) by mouth 3 (three) times daily., Disp: 270 capsule, Rfl: 1   latanoprost (XALATAN) 0.005 % ophthalmic solution, Place 1 drop into both eyes at bedtime., Disp: 2.5 mL, Rfl: 0   lubiprostone (AMITIZA) 24 MCG capsule, Take 1 capsule (24 mcg total) by mouth 2 (two) times daily with a meal.  For constipation (Patient taking differently: Take 24 mcg by mouth daily with breakfast. For constipation), Disp: 180 capsule, Rfl: 3   mirtazapine (REMERON) 7.5 MG tablet, Take 1 tablet (7.5 mg total) by mouth at bedtime., Disp: 90 tablet, Rfl: 3   ondansetron (ZOFRAN-ODT) 4 MG disintegrating tablet, Take 1 tablet (4 mg total) by mouth every 8 (eight) hours as needed., Disp: 20 tablet, Rfl: 0   polycarbophil (FIBERCON) 625 MG tablet, Take 1 tablet (625 mg total) by mouth daily., Disp: 30 tablet, Rfl: 0   senna (SENOKOT) 8.6 MG TABS tablet, Take 1 tablet (8.6 mg total) by mouth 2 (two) times daily., Disp: 120 tablet, Rfl: 0   sodium zirconium cyclosilicate (LOKELMA) 10 g PACK packet, Take 10 g by mouth 2 (two) times daily., Disp: 2 packet, Rfl: 0   timolol (TIMOPTIC) 0.5 % ophthalmic solution, Place 1 drop into the left eye every morning., Disp: , Rfl:    TRELEGY ELLIPTA 100-62.5-25 MCG/ACT AEPB, Inhale 1 puff into the lungs daily., Disp: 60 each, Rfl: 6   VENTOLIN HFA 108 (90 Base) MCG/ACT inhaler, INHALE 2 PUFFS EVERY 6 HOURS AS NEEDED FOR WHEEZING OR SHORTNESS OF BREATH, Disp: 18 g, Rfl: 1 Social History   Socioeconomic History   Marital status: Married    Spouse name: Charles Golden   Number of children: 3   Years of education: 12+   Highest education level: Bachelor's degree (e.g., BA, AB, BS)  Occupational History   Occupation: Retired    Comment: Programmer, systems  Tobacco Use   Smoking status: Former    Current packs/day: 0.00    Average packs/day: 1 pack/day for 30.0 years (30.0 ttl pk-yrs)    Types: Cigarettes    Start date: 05/28/1976    Quit date: 05/28/2006    Years since quitting: 17.2   Smokeless tobacco: Never  Vaping Use   Vaping status: Never Used  Substance and Sexual Activity   Alcohol use: Not Currently    Alcohol/week: 0.0 standard drinks of alcohol   Drug use: No   Sexual activity: Yes    Birth control/protection: None  Other Topics Concern   Not on file  Social  History Narrative   Patient lives at home with [name].    Patient has 3 children.    Patient has his BS   Patient is retired.    Drinks about 2 cups of coffee per day.      [facility]:   He is still married. He previously worked as a Programmer, systems. No significant dust exposure. He mostly worked with synthetic fibers. He is from [place].  Social Drivers of Corporate investment banker Strain: Low Risk  (02/09/2020)   Overall Financial Resource Strain (CARDIA)    Difficulty of Paying Living Expenses: Not hard at all  Food Insecurity: Low Risk  (08/12/2023)   Received from [facility]   Hunger Vital Sign    Worried About Running Out of Food in the Last Year: Never true    Ran Out of Food in the Last Year: Never true  Transportation Needs: No Transportation Needs (08/12/2023)   Received from [facility]    In the past 12 months, has lack of reliable transportation kept you from medical appointments, meetings, work or from getting things needed for daily living? : No  Physical Activity: Insufficiently Active (05/19/2021)   Exercise Vital Sign    Days of Exercise per Week: 1 day    Minutes of Exercise per Session: 10 min  Stress: Stress Concern Present (07/17/2021)   [employer] of Occupational Health - Occupational Stress Questionnaire    Feeling of Stress : To some extent  Social Connections: Socially Integrated (06/09/2019)   Social Connection and Isolation Panel [NHANES]    Frequency of Communication with Friends and Family: More than three times a week    Frequency of Social Gatherings with Friends and Family: More than three times a week    Attends Religious Services: More than 4 times per year    Active Member of [employer] or Organizations: Yes    Attends Engineer, structural: More than 4 times per year    Marital Status: Married  Catering manager Violence: Not At Risk (12/17/2022)   Humiliation, Afraid, Rape, and Kick  questionnaire    Fear of Current or Ex-Partner: No    Emotionally Abused: No    Physically Abused: No    Sexually Abused: No   Family History  Problem Relation Age of Onset   Heart disease Mother        Valve replacement and pacemaker   Colon cancer Father    Asthma Other        cousin   Healthy Daughter    Healthy Daughter    Healthy Daughter     Objective: Office vital signs reviewed. BP (!) 142/72   Pulse 66   Temp 98.5 F (36.9 C)   Ht 5\' 10"  (1.778 m)   Wt 159 lb (72.1 kg)   SpO2 99%   BMI 22.81 kg/m   Physical Examination:  General: Awake, alert, well nourished, No acute distress HEENT: sclera white, MMM Cardio: regular rate and rhythm, S1S2 heard  Pulm: Globally decreased breath sounds with no appreciable wheezes, rhonchi or rales.  He does have a productive cough which is observed during her visit today MSK: Tenderness palpation over the trochanteric bursa.  Ambulating with minimal assistance  Assessment/ Plan: 89 y.o. male   Trochanteric bursitis of right hip  Centrilobular emphysema (HCC)  Discussed that the hip pain is trochanteric bursitis.  He has an orthopedist and I encouraged him to ask for corticosteroid injection in this area.  A handout was provided with more information.  Discussed the emphysema will likely always cause him to have excess mucus production.  We discussed that without adequate water intake the Mucinex is essentially useless.  Encouraged hydration with water and to reduce soda intake.   Charles Ip, DO [facility] [phone]

## 2023-08-20 DIAGNOSIS — H401122 Primary open-angle glaucoma, left eye, moderate stage: Secondary | ICD-10-CM | POA: Diagnosis not present

## 2023-08-20 DIAGNOSIS — H40051 Ocular hypertension, right eye: Secondary | ICD-10-CM | POA: Diagnosis not present

## 2023-08-21 ENCOUNTER — Other Ambulatory Visit (HOSPITAL_BASED_OUTPATIENT_CLINIC_OR_DEPARTMENT_OTHER): Payer: Self-pay

## 2023-08-21 ENCOUNTER — Other Ambulatory Visit: Payer: Self-pay

## 2023-08-21 ENCOUNTER — Other Ambulatory Visit: Payer: Self-pay | Admitting: Family Medicine

## 2023-08-21 DIAGNOSIS — R338 Other retention of urine: Secondary | ICD-10-CM | POA: Diagnosis not present

## 2023-08-21 DIAGNOSIS — N401 Enlarged prostate with lower urinary tract symptoms: Secondary | ICD-10-CM | POA: Diagnosis not present

## 2023-08-21 DIAGNOSIS — I4811 Longstanding persistent atrial fibrillation: Secondary | ICD-10-CM

## 2023-08-21 MED ORDER — APIXABAN 2.5 MG PO TABS
2.5000 mg | ORAL_TABLET | Freq: Two times a day (BID) | ORAL | 1 refills | Status: DC
  Filled 2023-08-21 – 2023-11-10 (×2): qty 180, 90d supply, fill #0
  Filled 2024-02-01: qty 180, 90d supply, fill #1

## 2023-08-22 ENCOUNTER — Encounter: Payer: Self-pay | Admitting: Family Medicine

## 2023-08-26 DIAGNOSIS — H6123 Impacted cerumen, bilateral: Secondary | ICD-10-CM | POA: Diagnosis not present

## 2023-08-26 DIAGNOSIS — H903 Sensorineural hearing loss, bilateral: Secondary | ICD-10-CM | POA: Diagnosis not present

## 2023-09-12 DIAGNOSIS — R338 Other retention of urine: Secondary | ICD-10-CM | POA: Diagnosis not present

## 2023-09-24 DIAGNOSIS — H903 Sensorineural hearing loss, bilateral: Secondary | ICD-10-CM | POA: Diagnosis not present

## 2023-10-10 ENCOUNTER — Other Ambulatory Visit

## 2023-10-10 DIAGNOSIS — D631 Anemia in chronic kidney disease: Secondary | ICD-10-CM | POA: Diagnosis not present

## 2023-10-10 DIAGNOSIS — E211 Secondary hyperparathyroidism, not elsewhere classified: Secondary | ICD-10-CM | POA: Diagnosis not present

## 2023-10-10 DIAGNOSIS — N189 Chronic kidney disease, unspecified: Secondary | ICD-10-CM | POA: Diagnosis not present

## 2023-10-10 DIAGNOSIS — R809 Proteinuria, unspecified: Secondary | ICD-10-CM | POA: Diagnosis not present

## 2023-10-10 DIAGNOSIS — D649 Anemia, unspecified: Secondary | ICD-10-CM | POA: Diagnosis not present

## 2023-10-14 DIAGNOSIS — R3914 Feeling of incomplete bladder emptying: Secondary | ICD-10-CM | POA: Diagnosis not present

## 2023-10-21 DIAGNOSIS — D638 Anemia in other chronic diseases classified elsewhere: Secondary | ICD-10-CM | POA: Diagnosis not present

## 2023-10-21 DIAGNOSIS — N401 Enlarged prostate with lower urinary tract symptoms: Secondary | ICD-10-CM | POA: Diagnosis not present

## 2023-10-21 DIAGNOSIS — N138 Other obstructive and reflux uropathy: Secondary | ICD-10-CM | POA: Diagnosis not present

## 2023-10-21 DIAGNOSIS — N1832 Chronic kidney disease, stage 3b: Secondary | ICD-10-CM | POA: Diagnosis not present

## 2023-10-22 ENCOUNTER — Other Ambulatory Visit: Payer: Self-pay | Admitting: Family Medicine

## 2023-10-22 DIAGNOSIS — Z8673 Personal history of transient ischemic attack (TIA), and cerebral infarction without residual deficits: Secondary | ICD-10-CM

## 2023-10-28 ENCOUNTER — Other Ambulatory Visit: Payer: Self-pay | Admitting: Family Medicine

## 2023-11-02 ENCOUNTER — Other Ambulatory Visit (HOSPITAL_BASED_OUTPATIENT_CLINIC_OR_DEPARTMENT_OTHER): Payer: Self-pay

## 2023-11-04 ENCOUNTER — Other Ambulatory Visit

## 2023-11-04 DIAGNOSIS — N189 Chronic kidney disease, unspecified: Secondary | ICD-10-CM | POA: Diagnosis not present

## 2023-11-11 ENCOUNTER — Other Ambulatory Visit (HOSPITAL_BASED_OUTPATIENT_CLINIC_OR_DEPARTMENT_OTHER): Payer: Self-pay

## 2023-11-12 DIAGNOSIS — J479 Bronchiectasis, uncomplicated: Secondary | ICD-10-CM | POA: Diagnosis not present

## 2023-11-12 DIAGNOSIS — J432 Centrilobular emphysema: Secondary | ICD-10-CM | POA: Diagnosis not present

## 2023-11-14 DIAGNOSIS — R338 Other retention of urine: Secondary | ICD-10-CM | POA: Diagnosis not present

## 2023-11-28 ENCOUNTER — Other Ambulatory Visit: Payer: Self-pay | Admitting: Family Medicine

## 2023-11-28 DIAGNOSIS — M48062 Spinal stenosis, lumbar region with neurogenic claudication: Secondary | ICD-10-CM

## 2023-12-09 ENCOUNTER — Ambulatory Visit (INDEPENDENT_AMBULATORY_CARE_PROVIDER_SITE_OTHER): Admitting: Family Medicine

## 2023-12-09 ENCOUNTER — Encounter: Payer: Self-pay | Admitting: Family Medicine

## 2023-12-09 VITALS — BP 112/62 | HR 82 | Temp 98.5°F | Ht 70.0 in | Wt 158.0 lb

## 2023-12-09 DIAGNOSIS — N1832 Chronic kidney disease, stage 3b: Secondary | ICD-10-CM | POA: Diagnosis not present

## 2023-12-09 DIAGNOSIS — K130 Diseases of lips: Secondary | ICD-10-CM

## 2023-12-09 DIAGNOSIS — K5909 Other constipation: Secondary | ICD-10-CM | POA: Diagnosis not present

## 2023-12-09 DIAGNOSIS — M19011 Primary osteoarthritis, right shoulder: Secondary | ICD-10-CM | POA: Diagnosis not present

## 2023-12-09 DIAGNOSIS — L409 Psoriasis, unspecified: Secondary | ICD-10-CM

## 2023-12-09 MED ORDER — BETAMETHASONE DIPROPIONATE 0.05 % EX CREA: TOPICAL_CREAM | Freq: Two times a day (BID) | CUTANEOUS | 1 refills | Status: AC | PRN

## 2023-12-09 NOTE — Progress Notes (Signed)
 Subjective: CC: Check up PCP: Jolinda Norene HERO, DO YEP:Oonbi R Mattern is a 89 y.o. male presenting to clinic today for:  1.  Chronic constipation Patient is accompanied today's visit by his daughter.  He reports ongoing chronic constipation but actually admits that he is been having some diarrheal stools that are nonbloody lately.  He has been utilizing fiber daily.  Wants to know if he can go back to MiraLAX  as needed.  2.  Skin lesions He reports multiple skin lesions along the lower extremities that appear plaque-like initially and then become very itchy.  He has been utilizing Neosporin on these areas but is not moisturizing the skin with anything specific  3.  Skin lesion on cheek Patient reports that the left corner of his mouth he has a little area that is raw but not the worst it has ever been.  Wanted to know what he could do for this.  Admits that he is not drinking much water.  4.  Shoulder pain/arthritis/CKD3b Patient reports that he sometimes has some right sided shoulder pain that can be quite sharp at times.  He has some topical Voltaren  gel that he uses on his hands.  Not taking any oral NSAIDs secondary to CKD  ROS: Per HPI  Allergies  Allergen Reactions   Demerol [Meperidine] Other (See Comments)    Pt. States he woke up during a colonoscopy   Spiriva  Handihaler [Tiotropium Bromide  Monohydrate] Other (See Comments)    Mild Urinary Retention   Past Medical History:  Diagnosis Date   Acute kidney injury superimposed on chronic kidney disease (HCC) 01/11/2023   Acute medial meniscal tear 07/20/2014   Atrial fibrillation with RVR (HCC) 05/10/2021   Cerebrovascular accident (CVA) due to vascular stenosis (HCC) 05/01/2021   Formatting of this note might be different from the original.  Internal carotid artery stent placed in 04/2021     Chronic headache 08/05/2013   Collagenous colitis 07/24/2016   COPD (chronic obstructive pulmonary disease) (HCC)    CVA  (cerebral vascular accident) (HCC) 05/01/2021   Enlarged prostate    GERD (gastroesophageal reflux disease)    Glaucoma    Glucagonoma    Hernia, incisional    at present   [facility] (hard of hearing)    IBS (irritable bowel syndrome)    Incisional hernia, without obstruction or gangrene 08/05/2013   Sciatic pain    Small bowel volvulus (HCC) 08/05/2013   History of small bowel volvulus and intestinal obstruction with resection and re anastomosis     Stage 3b chronic kidney disease (CKD) (HCC)     Current Outpatient Medications:    acetaminophen  (TYLENOL ) 500 MG tablet, Take 1,000 mg by mouth daily as needed for headache or moderate pain., Disp: , Rfl:    apixaban  (ELIQUIS ) 2.5 MG TABS tablet, Take 1 tablet (2.5 mg total) by mouth 2 (two) times daily., Disp: 180 tablet, Rfl: 1   atorvastatin  (LIPITOR) 40 MG tablet, TAKE ONE TABLET ONCE DAILY, Disp: 90 tablet, Rfl: 0   Azelastine  HCl 137 MCG/SPRAY SOLN, PLACE 1 SPRAY IN EACH NOSTRIL ONCE A DAY AS DIRECTED, Disp: 30 mL, Rfl: 5   betamethasone  dipropionate 0.05 % cream, Apply topically 2 (two) times daily as needed (psoriasis). For up to 10 days per flareup, Disp: 45 g, Rfl: 1   cholecalciferol  (CHOLECALCIFEROL ) 25 MCG tablet, Take 1 tablet (1,000 Units total) by mouth every evening., Disp: 30 tablet, Rfl: 0   CRANBERRY PO, Take 1 tablet by mouth every  evening., Disp: , Rfl:    Cyanocobalamin  (VITAMIN B-12 PO), Take 1 tablet by mouth every evening., Disp: , Rfl:    doxycycline  (VIBRA -TABS) 100 MG tablet, Take 1 tablet (100 mg total) by mouth 2 (two) times daily., Disp: 20 tablet, Rfl: 0   ferrous sulfate 325 (65 FE) MG EC tablet, Take 325 mg by mouth daily., Disp: , Rfl:    finasteride  (PROSCAR ) 5 MG tablet, Take 5 mg by mouth daily., Disp: , Rfl:    fluticasone  (FLONASE ) 50 MCG/ACT nasal spray, USE 2 SPRAYS IN EACH NOSTRIL ONCE DAILY, Disp: 16 g, Rfl: 5   furosemide  (LASIX ) 20 MG tablet, Take 20 mg by mouth every other day., Disp: , Rfl:     gabapentin  (NEURONTIN ) 300 MG capsule, TAKE ONE CAPSULE THREE TIMES DAILY, Disp: 270 capsule, Rfl: 0   latanoprost  (XALATAN ) 0.005 % ophthalmic solution, Place 1 drop into both eyes at bedtime., Disp: 2.5 mL, Rfl: 0   lubiprostone  (AMITIZA ) 24 MCG capsule, Take 1 capsule (24 mcg total) by mouth 2 (two) times daily with a meal. For constipation (Patient taking differently: Take 24 mcg by mouth daily with breakfast. For constipation), Disp: 180 capsule, Rfl: 3   mirtazapine  (REMERON ) 7.5 MG tablet, Take 1 tablet (7.5 mg total) by mouth at bedtime., Disp: 90 tablet, Rfl: 3   ondansetron  (ZOFRAN -ODT) 4 MG disintegrating tablet, Take 1 tablet (4 mg total) by mouth every 8 (eight) hours as needed., Disp: 20 tablet, Rfl: 0   polycarbophil (FIBERCON) 625 MG tablet, Take 1 tablet (625 mg total) by mouth daily., Disp: 30 tablet, Rfl: 0   senna (SENOKOT) 8.6 MG TABS tablet, Take 1 tablet (8.6 mg total) by mouth 2 (two) times daily., Disp: 120 tablet, Rfl: 0   sodium zirconium cyclosilicate  (LOKELMA ) 10 g PACK packet, Take 10 g by mouth 2 (two) times daily., Disp: 2 packet, Rfl: 0   timolol  (TIMOPTIC ) 0.5 % ophthalmic solution, Place 1 drop into the left eye every morning., Disp: , Rfl:    TRELEGY ELLIPTA  100-62.5-25 MCG/ACT AEPB, Inhale 1 puff into the lungs daily., Disp: 60 each, Rfl: 6   VENTOLIN  HFA 108 (90 Base) MCG/ACT inhaler, INHALE 2 PUFFS EVERY 6 HOURS AS NEEDED FOR WHEEZING OR SHORTNESS OF BREATH, Disp: 18 g, Rfl: 1 Social History   Socioeconomic History   Marital status: Married    Spouse name: [name]   Number of children: 3   Years of education: 12+   Highest education level: Bachelor's degree (e.g., BA, AB, BS)  Occupational History   Occupation: Retired    Comment: Programmer, systems  Tobacco Use   Smoking status: Former    Current packs/day: 0.00    Average packs/day: 1 pack/day for 30.0 years (30.0 ttl pk-yrs)    Types: Cigarettes    Start date: 05/28/1976    Quit date: 05/28/2006    Years  since quitting: 17.5   Smokeless tobacco: Never  Vaping Use   Vaping status: Never Used  Substance and Sexual Activity   Alcohol use: Not Currently    Alcohol/week: 0.0 standard drinks of alcohol   Drug use: No   Sexual activity: Yes    Birth control/protection: None  Other Topics Concern   Not on file  Social History Narrative   Patient lives at home with [name].    Patient has 3 children.    Patient has his BS   Patient is retired.    Drinks about 2 cups of coffee per day.  [facility]:   He is still married. He previously worked as a Programmer, systems. No significant dust exposure. He mostly worked with synthetic fibers. He is from [place].       Social Drivers of Corporate investment banker Strain: Low Risk  (02/09/2020)   Overall Financial Resource Strain (CARDIA)    Difficulty of Paying Living Expenses: Not hard at all  Food Insecurity: Low Risk  (11/12/2023)   Received from [facility]   Hunger Vital Sign    Within the past 12 months, you worried that your food would run out before you got money to buy more: Never true    Within the past 12 months, the food you bought just didn't last and you didn't have money to get more. : Never true  Transportation Needs: No Transportation Needs (11/12/2023)   Received from [facility]    In the past 12 months, has lack of reliable transportation kept you from medical appointments, meetings, work or from getting things needed for daily living? : No  Physical Activity: Insufficiently Active (05/19/2021)   Exercise Vital Sign    Days of Exercise per Week: 1 day    Minutes of Exercise per Session: 10 min  Stress: Stress Concern Present (07/17/2021)   [employer] of Occupational Health - Occupational Stress Questionnaire    Feeling of Stress : To some extent  Social Connections: Socially Integrated (06/09/2019)   Social Connection and Isolation Panel    Frequency of Communication with  Friends and Family: More than three times a week    Frequency of Social Gatherings with Friends and Family: More than three times a week    Attends Religious Services: More than 4 times per year    Active Member of [employer] or Organizations: Yes    Attends Engineer, structural: More than 4 times per year    Marital Status: Married  Catering manager Violence: Not At Risk (12/17/2022)   Humiliation, Afraid, Rape, and Kick questionnaire    Fear of Current or Ex-Partner: No    Emotionally Abused: No    Physically Abused: No    Sexually Abused: No   Family History  Problem Relation Age of Onset   Heart disease Mother        Valve replacement and pacemaker   Colon cancer Father    Asthma Other        cousin   Healthy Daughter    Healthy Daughter    Healthy Daughter     Objective: Office vital signs reviewed. BP 112/62   Pulse 82   Temp 98.5 F (36.9 C)   Ht 5' 10 (1.778 m)   Wt 158 lb (71.7 kg)   SpO2 96%   BMI 22.67 kg/m   Physical Examination:  General: Awake, alert, well nourished, No acute distress HEENT: White.  Moist mucous membranes.  Mild cheilitis noted at the left side of the mouth. Cardio: regular rate and rhythm, S1S2 heard, no murmurs appreciated Pulm: clear to auscultation bilaterally, no wheezes, rhonchi or rales; normal work of breathing on room air GI: soft, NT/ND Skin: Has Auspitz sign present along the left anterior ankle.  Has post inflammatory hyperpigmentation noted along the left lateral leg. MSK: No gross joint swelling on the right shoulder.  Mild painful arc sign present but has intact active range of motion  Assessment/ Plan: 89 y.o. male   Stage 3b chronic kidney disease (HCC) - Plan: Basic Metabolic Panel, CBC  Arthritis of right acromioclavicular joint  Psoriasis - Plan: betamethasone  dipropionate 0.05 % cream  Cheilitis  Chronic constipation  Again I reinforced need for adequate hydration, particular in the setting of CKD 3B.   Check renal function, CBC  We discussed that [facility] joint can be treated with topical Voltaren  gel if needed.  He is on chronic anticoagulation and has CKD so not a candidate for oral NSAID.  Okay to use Tylenol .  If persistently irritated, can consider referral for intra-articular injection  Betamethasone  apply to the affected areas twice daily as needed psoriasis.  Discussed proper skin care and oral hydration as well as topicals recommended.  Handout provided  Supportive care for cheilitis  Again reinforced need for adequate hydration.  Has Amitiza  on board.  Okay to add MiraLAX  if needed   Norene CHRISTELLA Fielding, DO [facility] [phone]

## 2023-12-09 NOTE — Patient Instructions (Addendum)
Basic Skin Care Your skin plays an important role in keeping the entire body healthy.  Below are some tips on how to try and maximize skin health from the outside in.  Bathe in mildly warm water every 1 to 3 days, followed by light drying and an application of a thick moisturizer cream or ointment, preferably one that comes in a tub. Fragrance free moisturizing bars or body washes are preferred such as Purpose, Cetaphil, Dove sensitive skin, Aveeno, [place] Baby or Vanicream products. Use a fragrance free cream or ointment, not a lotion, such as plain petroleum jelly or Vaseline ointment, Aquaphor, Vanicream, Eucerin cream or a generic version, CeraVe Cream, Cetaphil Restoraderm, Aveeno Eczema Therapy and [place] Baby Calming, among others. Children with very dry skin often need to put on these creams two, three or four times a day.  As much as possible, use these creams enough to keep the skin from looking dry. Consider using fragrance free/dye free detergent, such as Arm and Hammer for sensitive skin, Tide Free or All Free.   If I am prescribing a medication to go on the skin, the medicine goes on first to the areas that need it, followed by a thick cream as above to the entire body.  Sun is a major cause of damage to the skin. I recommend sun protection for all of my patients. I prefer physical barriers such as hats with wide brims that cover the ears, long sleeve clothing with SPF protection including rash guards for swimming. These can be found seasonally at outdoor clothing companies, [employer] and [employer] and online at www.coolibar.com, www.uvskinz.com and www.sunprecautions.com. Avoid peak sun between the hours of 10am to 3pm to minimize sun exposure.  I recommend sunscreen for all of my patients older than 6 months of age when in the sun, preferably with broad spectrum coverage and SPF 30 or higher.  For children, I recommend sunscreens that only contain titanium dioxide and/or zinc oxide  in the active ingredients. These do not burn the eyes and appear to be safer than chemical sunscreens. These sunscreens include zinc oxide paste found in the diaper section, Vanicream Broad Spectrum 50+, Aveeno Natural Mineral Protection, Neutrogena Pure and Free Baby, Johnson and Johnson Baby Daily face and body lotion, [place] Baby products, among others. There is no such thing as waterproof sunscreen. All sunscreens should be reapplied after 60-80 minutes of wear.  Spray on sunscreens often use chemical sunscreens which do protect against the sun. However, these can be difficult to apply correctly, especially if wind is present, and can be more likely to irritate the skin.  Long term effects of chemical sunscreens are also not fully known.     

## 2023-12-10 ENCOUNTER — Ambulatory Visit: Payer: Self-pay | Admitting: Family Medicine

## 2023-12-10 LAB — BASIC METABOLIC PANEL WITH GFR
BUN/Creatinine Ratio: 10 (ref 10–24)
BUN: 15 mg/dL (ref 10–36)
CO2: 21 mmol/L (ref 20–29)
Calcium: 8.5 mg/dL — AB (ref 8.6–10.2)
Chloride: 108 mmol/L — AB (ref 96–106)
Creatinine, Ser: 1.54 mg/dL — AB (ref 0.76–1.27)
Glucose: 92 mg/dL (ref 70–99)
Potassium: 4.8 mmol/L (ref 3.5–5.2)
Sodium: 142 mmol/L (ref 134–144)
eGFR: 41 mL/min/1.73 — AB (ref >59)

## 2023-12-10 LAB — CBC
Hematocrit: 38.4 % (ref 37.5–51.0)
Hemoglobin: 12 g/dL — ABNORMAL LOW (ref 13.0–17.7)
MCH: 30.7 pg (ref 26.6–33.0)
MCHC: 31.3 g/dL — ABNORMAL LOW (ref 31.5–35.7)
MCV: 98 fL — ABNORMAL HIGH (ref 79–97)
Platelets: 250 x10E3/uL (ref 150–450)
RBC: 3.91 x10E6/uL — ABNORMAL LOW (ref 4.14–5.80)
RDW: 12.6 % (ref 11.6–15.4)
WBC: 8.1 x10E3/uL (ref 3.4–10.8)

## 2023-12-11 ENCOUNTER — Encounter: Payer: Self-pay | Admitting: Family Medicine

## 2023-12-16 DIAGNOSIS — R3914 Feeling of incomplete bladder emptying: Secondary | ICD-10-CM | POA: Diagnosis not present

## 2023-12-25 ENCOUNTER — Other Ambulatory Visit: Payer: Self-pay | Admitting: Family Medicine

## 2023-12-25 DIAGNOSIS — J449 Chronic obstructive pulmonary disease, unspecified: Secondary | ICD-10-CM

## 2024-01-15 DIAGNOSIS — R338 Other retention of urine: Secondary | ICD-10-CM | POA: Diagnosis not present

## 2024-01-16 ENCOUNTER — Other Ambulatory Visit: Payer: Self-pay | Admitting: Family Medicine

## 2024-01-16 DIAGNOSIS — Z8673 Personal history of transient ischemic attack (TIA), and cerebral infarction without residual deficits: Secondary | ICD-10-CM

## 2024-01-22 DIAGNOSIS — R300 Dysuria: Secondary | ICD-10-CM | POA: Diagnosis not present

## 2024-01-22 DIAGNOSIS — N401 Enlarged prostate with lower urinary tract symptoms: Secondary | ICD-10-CM | POA: Diagnosis not present

## 2024-01-22 DIAGNOSIS — R3914 Feeling of incomplete bladder emptying: Secondary | ICD-10-CM | POA: Diagnosis not present

## 2024-02-17 ENCOUNTER — Other Ambulatory Visit

## 2024-02-17 ENCOUNTER — Other Ambulatory Visit: Payer: Self-pay | Admitting: Family Medicine

## 2024-02-17 DIAGNOSIS — E559 Vitamin D deficiency, unspecified: Secondary | ICD-10-CM | POA: Diagnosis not present

## 2024-02-17 DIAGNOSIS — N189 Chronic kidney disease, unspecified: Secondary | ICD-10-CM | POA: Diagnosis not present

## 2024-02-17 DIAGNOSIS — R809 Proteinuria, unspecified: Secondary | ICD-10-CM | POA: Diagnosis not present

## 2024-02-17 DIAGNOSIS — D631 Anemia in chronic kidney disease: Secondary | ICD-10-CM | POA: Diagnosis not present

## 2024-02-17 DIAGNOSIS — E119 Type 2 diabetes mellitus without complications: Secondary | ICD-10-CM | POA: Diagnosis not present

## 2024-02-17 DIAGNOSIS — E211 Secondary hyperparathyroidism, not elsewhere classified: Secondary | ICD-10-CM | POA: Diagnosis not present

## 2024-02-17 DIAGNOSIS — M48062 Spinal stenosis, lumbar region with neurogenic claudication: Secondary | ICD-10-CM

## 2024-02-17 DIAGNOSIS — I10 Essential (primary) hypertension: Secondary | ICD-10-CM | POA: Diagnosis not present

## 2024-02-18 DIAGNOSIS — R3914 Feeling of incomplete bladder emptying: Secondary | ICD-10-CM | POA: Diagnosis not present

## 2024-02-25 DIAGNOSIS — Z961 Presence of intraocular lens: Secondary | ICD-10-CM | POA: Diagnosis not present

## 2024-02-25 DIAGNOSIS — H26492 Other secondary cataract, left eye: Secondary | ICD-10-CM | POA: Diagnosis not present

## 2024-02-25 DIAGNOSIS — H40051 Ocular hypertension, right eye: Secondary | ICD-10-CM | POA: Diagnosis not present

## 2024-02-25 DIAGNOSIS — H401122 Primary open-angle glaucoma, left eye, moderate stage: Secondary | ICD-10-CM | POA: Diagnosis not present

## 2024-02-26 DIAGNOSIS — D638 Anemia in other chronic diseases classified elsewhere: Secondary | ICD-10-CM | POA: Diagnosis not present

## 2024-02-26 DIAGNOSIS — R809 Proteinuria, unspecified: Secondary | ICD-10-CM | POA: Diagnosis not present

## 2024-02-26 DIAGNOSIS — N1832 Chronic kidney disease, stage 3b: Secondary | ICD-10-CM | POA: Diagnosis not present

## 2024-02-26 DIAGNOSIS — N138 Other obstructive and reflux uropathy: Secondary | ICD-10-CM | POA: Diagnosis not present

## 2024-03-06 DIAGNOSIS — H353123 Nonexudative age-related macular degeneration, left eye, advanced atrophic without subfoveal involvement: Secondary | ICD-10-CM | POA: Diagnosis not present

## 2024-03-06 DIAGNOSIS — H43813 Vitreous degeneration, bilateral: Secondary | ICD-10-CM | POA: Diagnosis not present

## 2024-03-06 DIAGNOSIS — Z961 Presence of intraocular lens: Secondary | ICD-10-CM | POA: Diagnosis not present

## 2024-03-06 DIAGNOSIS — H353114 Nonexudative age-related macular degeneration, right eye, advanced atrophic with subfoveal involvement: Secondary | ICD-10-CM | POA: Diagnosis not present

## 2024-03-07 ENCOUNTER — Other Ambulatory Visit: Payer: Self-pay | Admitting: Family Medicine

## 2024-03-11 ENCOUNTER — Other Ambulatory Visit (HOSPITAL_BASED_OUTPATIENT_CLINIC_OR_DEPARTMENT_OTHER): Payer: Self-pay

## 2024-03-11 MED ORDER — TRELEGY ELLIPTA 100-62.5-25 MCG/ACT IN AEPB
1.0000 | INHALATION_SPRAY | Freq: Every day | RESPIRATORY_TRACT | 6 refills | Status: AC
  Filled 2024-03-11: qty 60, 30d supply, fill #0
  Filled 2024-04-08: qty 60, 30d supply, fill #1
  Filled 2024-05-03: qty 60, 30d supply, fill #2
  Filled 2024-06-05: qty 60, 30d supply, fill #3
  Filled 2024-07-03: qty 60, 30d supply, fill #4

## 2024-03-19 DIAGNOSIS — R338 Other retention of urine: Secondary | ICD-10-CM | POA: Diagnosis not present

## 2024-04-03 DIAGNOSIS — H353114 Nonexudative age-related macular degeneration, right eye, advanced atrophic with subfoveal involvement: Secondary | ICD-10-CM | POA: Diagnosis not present

## 2024-04-03 DIAGNOSIS — H353123 Nonexudative age-related macular degeneration, left eye, advanced atrophic without subfoveal involvement: Secondary | ICD-10-CM | POA: Diagnosis not present

## 2024-04-20 DIAGNOSIS — R338 Other retention of urine: Secondary | ICD-10-CM | POA: Diagnosis not present

## 2024-05-03 ENCOUNTER — Other Ambulatory Visit: Payer: Self-pay | Admitting: Family Medicine

## 2024-05-03 DIAGNOSIS — I4811 Longstanding persistent atrial fibrillation: Secondary | ICD-10-CM

## 2024-05-04 ENCOUNTER — Other Ambulatory Visit: Payer: Self-pay

## 2024-05-04 ENCOUNTER — Other Ambulatory Visit (HOSPITAL_BASED_OUTPATIENT_CLINIC_OR_DEPARTMENT_OTHER): Payer: Self-pay

## 2024-05-04 MED ORDER — APIXABAN 2.5 MG PO TABS
2.5000 mg | ORAL_TABLET | Freq: Two times a day (BID) | ORAL | 0 refills | Status: AC
  Filled 2024-05-04: qty 180, 90d supply, fill #0

## 2024-05-06 ENCOUNTER — Encounter: Payer: Self-pay | Admitting: Family Medicine

## 2024-05-07 DIAGNOSIS — H353123 Nonexudative age-related macular degeneration, left eye, advanced atrophic without subfoveal involvement: Secondary | ICD-10-CM | POA: Diagnosis not present

## 2024-05-07 DIAGNOSIS — Z961 Presence of intraocular lens: Secondary | ICD-10-CM | POA: Diagnosis not present

## 2024-05-07 DIAGNOSIS — H353114 Nonexudative age-related macular degeneration, right eye, advanced atrophic with subfoveal involvement: Secondary | ICD-10-CM | POA: Diagnosis not present

## 2024-05-07 DIAGNOSIS — H43813 Vitreous degeneration, bilateral: Secondary | ICD-10-CM | POA: Diagnosis not present

## 2024-05-11 DIAGNOSIS — R338 Other retention of urine: Secondary | ICD-10-CM | POA: Diagnosis not present

## 2024-05-13 ENCOUNTER — Other Ambulatory Visit: Payer: Self-pay | Admitting: Family Medicine

## 2024-06-07 ENCOUNTER — Other Ambulatory Visit: Payer: Self-pay

## 2024-06-10 ENCOUNTER — Ambulatory Visit: Payer: Self-pay | Admitting: Family Medicine

## 2024-06-10 ENCOUNTER — Other Ambulatory Visit

## 2024-06-30 ENCOUNTER — Ambulatory Visit

## 2024-07-03 ENCOUNTER — Other Ambulatory Visit: Payer: Self-pay

## 2024-08-11 ENCOUNTER — Ambulatory Visit: Admitting: Family Medicine

## 2024-09-09 ENCOUNTER — Encounter: Payer: Self-pay | Admitting: Family Medicine
